# Patient Record
Sex: Female | Born: 1955 | Race: White | Hispanic: No | Smoking: Former smoker
Health system: Southern US, Community
[De-identification: ages and names within clinical notes are randomized; demographics above are authoritative.]

## PROBLEM LIST (undated history)

## (undated) ENCOUNTER — Emergency Department (HOSPITAL_COMMUNITY): Payer: Medicare Other

## (undated) DIAGNOSIS — J449 Chronic obstructive pulmonary disease, unspecified: Secondary | ICD-10-CM

## (undated) DIAGNOSIS — E079 Disorder of thyroid, unspecified: Secondary | ICD-10-CM

## (undated) DIAGNOSIS — R42 Dizziness and giddiness: Secondary | ICD-10-CM

## (undated) DIAGNOSIS — K219 Gastro-esophageal reflux disease without esophagitis: Secondary | ICD-10-CM

## (undated) DIAGNOSIS — E039 Hypothyroidism, unspecified: Secondary | ICD-10-CM

## (undated) DIAGNOSIS — R5383 Other fatigue: Secondary | ICD-10-CM

## (undated) DIAGNOSIS — M549 Dorsalgia, unspecified: Secondary | ICD-10-CM

## (undated) DIAGNOSIS — R911 Solitary pulmonary nodule: Secondary | ICD-10-CM

## (undated) DIAGNOSIS — J189 Pneumonia, unspecified organism: Secondary | ICD-10-CM

## (undated) DIAGNOSIS — M545 Low back pain, unspecified: Secondary | ICD-10-CM

## (undated) DIAGNOSIS — M542 Cervicalgia: Secondary | ICD-10-CM

## (undated) DIAGNOSIS — M797 Fibromyalgia: Secondary | ICD-10-CM

## (undated) DIAGNOSIS — D759 Disease of blood and blood-forming organs, unspecified: Secondary | ICD-10-CM

## (undated) DIAGNOSIS — J479 Bronchiectasis, uncomplicated: Secondary | ICD-10-CM

## (undated) DIAGNOSIS — F32A Depression, unspecified: Secondary | ICD-10-CM

## (undated) DIAGNOSIS — F329 Major depressive disorder, single episode, unspecified: Secondary | ICD-10-CM

## (undated) DIAGNOSIS — R519 Headache, unspecified: Secondary | ICD-10-CM

## (undated) DIAGNOSIS — G8929 Other chronic pain: Secondary | ICD-10-CM

## (undated) DIAGNOSIS — E538 Deficiency of other specified B group vitamins: Secondary | ICD-10-CM

## (undated) DIAGNOSIS — Z8719 Personal history of other diseases of the digestive system: Secondary | ICD-10-CM

## (undated) DIAGNOSIS — R0981 Nasal congestion: Secondary | ICD-10-CM

## (undated) DIAGNOSIS — M1990 Unspecified osteoarthritis, unspecified site: Secondary | ICD-10-CM

## (undated) DIAGNOSIS — F419 Anxiety disorder, unspecified: Secondary | ICD-10-CM

## (undated) DIAGNOSIS — R51 Headache: Secondary | ICD-10-CM

## (undated) DIAGNOSIS — A310 Pulmonary mycobacterial infection: Secondary | ICD-10-CM

## (undated) DIAGNOSIS — I10 Essential (primary) hypertension: Secondary | ICD-10-CM

## (undated) HISTORY — DX: Gastro-esophageal reflux disease without esophagitis: K21.9

## (undated) HISTORY — DX: Anxiety disorder, unspecified: F41.9

## (undated) HISTORY — DX: Unspecified osteoarthritis, unspecified site: M19.90

## (undated) HISTORY — DX: Deficiency of other specified B group vitamins: E53.8

## (undated) HISTORY — DX: Disorder of thyroid, unspecified: E07.9

## (undated) HISTORY — DX: Low back pain: M54.5

## (undated) HISTORY — DX: Pulmonary mycobacterial infection: A31.0

## (undated) HISTORY — PX: CERVICAL LAMINECTOMY: SHX94

## (undated) HISTORY — DX: Low back pain, unspecified: M54.50

## (undated) HISTORY — DX: Major depressive disorder, single episode, unspecified: F32.9

## (undated) HISTORY — DX: Depression, unspecified: F32.A

---

## 1978-10-29 HISTORY — PX: TUBAL LIGATION: SHX77

## 1999-12-07 ENCOUNTER — Encounter: Payer: Self-pay | Admitting: Emergency Medicine

## 1999-12-07 ENCOUNTER — Emergency Department (HOSPITAL_COMMUNITY): Admission: EM | Admit: 1999-12-07 | Discharge: 1999-12-07 | Payer: Self-pay | Admitting: Emergency Medicine

## 2000-06-13 ENCOUNTER — Ambulatory Visit (HOSPITAL_COMMUNITY): Admission: RE | Admit: 2000-06-13 | Discharge: 2000-06-14 | Payer: Self-pay | Admitting: Neurosurgery

## 2000-07-05 ENCOUNTER — Encounter: Admission: RE | Admit: 2000-07-05 | Discharge: 2000-07-05 | Payer: Self-pay | Admitting: Neurosurgery

## 2000-09-03 ENCOUNTER — Encounter: Admission: RE | Admit: 2000-09-03 | Discharge: 2000-09-03 | Payer: Self-pay | Admitting: Neurosurgery

## 2000-09-12 ENCOUNTER — Encounter: Admission: RE | Admit: 2000-09-12 | Discharge: 2000-10-11 | Payer: Self-pay | Admitting: Neurosurgery

## 2000-12-03 ENCOUNTER — Encounter: Admission: RE | Admit: 2000-12-03 | Discharge: 2000-12-03 | Payer: Self-pay | Admitting: Neurosurgery

## 2001-05-19 ENCOUNTER — Other Ambulatory Visit: Admission: RE | Admit: 2001-05-19 | Discharge: 2001-05-19 | Payer: Self-pay | Admitting: *Deleted

## 2001-05-20 ENCOUNTER — Encounter (INDEPENDENT_AMBULATORY_CARE_PROVIDER_SITE_OTHER): Payer: Self-pay

## 2001-05-20 ENCOUNTER — Other Ambulatory Visit: Admission: RE | Admit: 2001-05-20 | Discharge: 2001-05-20 | Payer: Self-pay | Admitting: *Deleted

## 2001-07-22 ENCOUNTER — Ambulatory Visit (HOSPITAL_COMMUNITY): Admission: RE | Admit: 2001-07-22 | Discharge: 2001-07-22 | Payer: Self-pay | Admitting: Neurosurgery

## 2001-08-28 ENCOUNTER — Encounter: Admission: RE | Admit: 2001-08-28 | Discharge: 2001-11-26 | Payer: Self-pay | Admitting: Neurosurgery

## 2001-12-23 ENCOUNTER — Inpatient Hospital Stay (HOSPITAL_COMMUNITY): Admission: RE | Admit: 2001-12-23 | Discharge: 2001-12-24 | Payer: Self-pay | Admitting: Psychiatry

## 2002-01-13 ENCOUNTER — Encounter: Admission: RE | Admit: 2002-01-13 | Discharge: 2002-01-13 | Payer: Self-pay | Admitting: *Deleted

## 2002-01-26 ENCOUNTER — Encounter: Admission: RE | Admit: 2002-01-26 | Discharge: 2002-01-26 | Payer: Self-pay | Admitting: *Deleted

## 2002-02-09 ENCOUNTER — Encounter: Admission: RE | Admit: 2002-02-09 | Discharge: 2002-02-09 | Payer: Self-pay | Admitting: Psychiatry

## 2002-03-06 ENCOUNTER — Encounter: Admission: RE | Admit: 2002-03-06 | Discharge: 2002-03-06 | Payer: Self-pay | Admitting: Psychiatry

## 2002-03-12 ENCOUNTER — Encounter: Admission: RE | Admit: 2002-03-12 | Discharge: 2002-03-12 | Payer: Self-pay | Admitting: Psychiatry

## 2002-03-18 ENCOUNTER — Encounter: Admission: RE | Admit: 2002-03-18 | Discharge: 2002-03-18 | Payer: Self-pay | Admitting: Psychiatry

## 2002-03-27 ENCOUNTER — Encounter: Admission: RE | Admit: 2002-03-27 | Discharge: 2002-03-27 | Payer: Self-pay | Admitting: Psychiatry

## 2002-04-13 ENCOUNTER — Encounter: Admission: RE | Admit: 2002-04-13 | Discharge: 2002-04-13 | Payer: Self-pay | Admitting: Psychiatry

## 2002-05-13 ENCOUNTER — Encounter: Admission: RE | Admit: 2002-05-13 | Discharge: 2002-05-13 | Payer: Self-pay | Admitting: Psychiatry

## 2002-07-04 ENCOUNTER — Emergency Department (HOSPITAL_COMMUNITY): Admission: EM | Admit: 2002-07-04 | Discharge: 2002-07-04 | Payer: Self-pay | Admitting: Emergency Medicine

## 2002-11-02 ENCOUNTER — Encounter: Payer: Self-pay | Admitting: Internal Medicine

## 2002-11-02 ENCOUNTER — Encounter: Admission: RE | Admit: 2002-11-02 | Discharge: 2002-11-02 | Payer: Self-pay | Admitting: Internal Medicine

## 2002-12-22 ENCOUNTER — Emergency Department (HOSPITAL_COMMUNITY): Admission: EM | Admit: 2002-12-22 | Discharge: 2002-12-23 | Payer: Self-pay | Admitting: Emergency Medicine

## 2002-12-23 ENCOUNTER — Emergency Department (HOSPITAL_COMMUNITY): Admission: EM | Admit: 2002-12-23 | Discharge: 2002-12-23 | Payer: Self-pay | Admitting: Emergency Medicine

## 2002-12-23 ENCOUNTER — Encounter: Payer: Self-pay | Admitting: Emergency Medicine

## 2004-09-15 ENCOUNTER — Ambulatory Visit: Payer: Self-pay | Admitting: Endocrinology

## 2004-09-15 ENCOUNTER — Ambulatory Visit (HOSPITAL_COMMUNITY): Admission: RE | Admit: 2004-09-15 | Discharge: 2004-09-15 | Payer: Self-pay | Admitting: Endocrinology

## 2004-10-06 ENCOUNTER — Ambulatory Visit: Payer: Self-pay | Admitting: Internal Medicine

## 2004-11-02 ENCOUNTER — Ambulatory Visit (HOSPITAL_COMMUNITY): Admission: RE | Admit: 2004-11-02 | Discharge: 2004-11-03 | Payer: Self-pay | Admitting: Neurosurgery

## 2005-02-16 ENCOUNTER — Ambulatory Visit: Payer: Self-pay | Admitting: Internal Medicine

## 2005-03-30 ENCOUNTER — Ambulatory Visit: Payer: Self-pay | Admitting: Internal Medicine

## 2005-05-29 ENCOUNTER — Ambulatory Visit: Payer: Self-pay | Admitting: Internal Medicine

## 2005-06-08 ENCOUNTER — Ambulatory Visit: Payer: Self-pay | Admitting: Internal Medicine

## 2005-08-01 ENCOUNTER — Ambulatory Visit: Payer: Self-pay | Admitting: Internal Medicine

## 2005-09-03 ENCOUNTER — Ambulatory Visit: Payer: Self-pay | Admitting: Internal Medicine

## 2005-09-06 ENCOUNTER — Ambulatory Visit: Payer: Self-pay | Admitting: Cardiology

## 2005-09-06 ENCOUNTER — Ambulatory Visit (HOSPITAL_COMMUNITY): Admission: RE | Admit: 2005-09-06 | Discharge: 2005-09-06 | Payer: Self-pay | Admitting: Internal Medicine

## 2005-11-12 ENCOUNTER — Ambulatory Visit: Payer: Self-pay | Admitting: Internal Medicine

## 2005-11-12 ENCOUNTER — Ambulatory Visit (HOSPITAL_COMMUNITY): Admission: RE | Admit: 2005-11-12 | Discharge: 2005-11-12 | Payer: Self-pay | Admitting: Internal Medicine

## 2005-12-14 ENCOUNTER — Ambulatory Visit: Payer: Self-pay | Admitting: Internal Medicine

## 2006-01-23 ENCOUNTER — Ambulatory Visit: Payer: Self-pay | Admitting: Internal Medicine

## 2006-04-15 ENCOUNTER — Ambulatory Visit: Payer: Self-pay | Admitting: Internal Medicine

## 2006-05-07 ENCOUNTER — Ambulatory Visit: Payer: Self-pay | Admitting: Internal Medicine

## 2006-07-08 ENCOUNTER — Ambulatory Visit: Payer: Self-pay | Admitting: Internal Medicine

## 2006-09-26 ENCOUNTER — Ambulatory Visit: Payer: Self-pay | Admitting: Internal Medicine

## 2006-09-26 LAB — CONVERTED CEMR LAB
ALT: 13 units/L (ref 0–40)
AST: 16 units/L (ref 0–37)
Albumin: 3.9 g/dL (ref 3.5–5.2)
Alkaline Phosphatase: 63 units/L (ref 39–117)
BUN: 17 mg/dL (ref 6–23)
CO2: 30 meq/L (ref 19–32)
Calcium: 9.5 mg/dL (ref 8.4–10.5)
Chloride: 104 meq/L (ref 96–112)
Creatinine, Ser: 0.9 mg/dL (ref 0.4–1.2)
GFR calc non Af Amer: 70 mL/min
Glomerular Filtration Rate, Af Am: 85 mL/min/{1.73_m2}
Glucose, Bld: 78 mg/dL (ref 70–99)
Potassium: 4.3 meq/L (ref 3.5–5.1)
Sed Rate: 11 mm/hr (ref 0–25)
Sodium: 140 meq/L (ref 135–145)
Total Bilirubin: 0.7 mg/dL (ref 0.3–1.2)
Total CK: 85 units/L (ref 7–177)
Total Protein: 6.6 g/dL (ref 6.0–8.3)

## 2006-10-09 ENCOUNTER — Ambulatory Visit: Payer: Self-pay | Admitting: Internal Medicine

## 2006-12-23 ENCOUNTER — Ambulatory Visit: Payer: Self-pay | Admitting: Internal Medicine

## 2006-12-26 ENCOUNTER — Ambulatory Visit (HOSPITAL_COMMUNITY): Admission: RE | Admit: 2006-12-26 | Discharge: 2006-12-26 | Payer: Self-pay | Admitting: Internal Medicine

## 2007-01-06 ENCOUNTER — Ambulatory Visit: Payer: Self-pay | Admitting: Internal Medicine

## 2007-01-24 ENCOUNTER — Ambulatory Visit: Payer: Self-pay | Admitting: Internal Medicine

## 2007-04-29 ENCOUNTER — Ambulatory Visit: Payer: Self-pay | Admitting: Internal Medicine

## 2007-04-29 LAB — CONVERTED CEMR LAB
Basophils Absolute: 0.1 10*3/uL (ref 0.0–0.1)
Basophils Relative: 1 % (ref 0.0–1.0)
Bilirubin Urine: NEGATIVE
Eosinophils Absolute: 0.2 10*3/uL (ref 0.0–0.6)
Eosinophils Relative: 2.9 % (ref 0.0–5.0)
HCT: 36.8 % (ref 36.0–46.0)
Hemoglobin: 11.8 g/dL — ABNORMAL LOW (ref 12.0–15.0)
Iron: 22 ug/dL — ABNORMAL LOW (ref 42–145)
Ketones, ur: NEGATIVE mg/dL
Leukocytes, UA: NEGATIVE
Lymphocytes Relative: 31.3 % (ref 12.0–46.0)
MCHC: 32.1 g/dL (ref 30.0–36.0)
MCV: 68.7 fL — ABNORMAL LOW (ref 78.0–100.0)
Monocytes Absolute: 0.5 10*3/uL (ref 0.2–0.7)
Monocytes Relative: 7.9 % (ref 3.0–11.0)
Neutro Abs: 3.5 10*3/uL (ref 1.4–7.7)
Neutrophils Relative %: 56.9 % (ref 43.0–77.0)
Nitrite: NEGATIVE
Platelets: 228 10*3/uL (ref 150–400)
RBC: 5.35 M/uL — ABNORMAL HIGH (ref 3.87–5.11)
RDW: 17.6 % — ABNORMAL HIGH (ref 11.5–14.6)
Saturation Ratios: 3.7 % — ABNORMAL LOW (ref 20.0–50.0)
Sed Rate: 10 mm/hr (ref 0–25)
Specific Gravity, Urine: 1.025 (ref 1.000–1.03)
TSH: 11.86 microintl units/mL — ABNORMAL HIGH (ref 0.35–5.50)
Total CK: 88 units/L (ref 7–177)
Total Protein, Urine: NEGATIVE mg/dL
Transferrin: 429.2 mg/dL — ABNORMAL HIGH (ref >=212.0)
Urine Glucose: NEGATIVE mg/dL
Urobilinogen, UA: 0.2 (ref 0.0–1.0)
Vitamin B-12: 696 pg/mL (ref 211–911)
WBC: 6.3 10*3/uL (ref 4.5–10.5)
pH: 5.5 (ref 5.0–8.0)

## 2007-05-22 DIAGNOSIS — D509 Iron deficiency anemia, unspecified: Secondary | ICD-10-CM

## 2007-05-22 DIAGNOSIS — E538 Deficiency of other specified B group vitamins: Secondary | ICD-10-CM | POA: Insufficient documentation

## 2007-06-06 ENCOUNTER — Ambulatory Visit: Payer: Self-pay | Admitting: Internal Medicine

## 2007-07-23 DIAGNOSIS — F419 Anxiety disorder, unspecified: Secondary | ICD-10-CM | POA: Insufficient documentation

## 2007-07-23 DIAGNOSIS — IMO0001 Reserved for inherently not codable concepts without codable children: Secondary | ICD-10-CM | POA: Insufficient documentation

## 2007-07-23 DIAGNOSIS — F411 Generalized anxiety disorder: Secondary | ICD-10-CM | POA: Insufficient documentation

## 2007-07-23 DIAGNOSIS — M545 Low back pain: Secondary | ICD-10-CM | POA: Insufficient documentation

## 2007-08-05 ENCOUNTER — Encounter: Payer: Self-pay | Admitting: Internal Medicine

## 2007-08-05 ENCOUNTER — Ambulatory Visit: Payer: Self-pay | Admitting: Internal Medicine

## 2007-08-05 DIAGNOSIS — F319 Bipolar disorder, unspecified: Secondary | ICD-10-CM

## 2007-08-05 DIAGNOSIS — K219 Gastro-esophageal reflux disease without esophagitis: Secondary | ICD-10-CM | POA: Insufficient documentation

## 2007-08-05 DIAGNOSIS — E039 Hypothyroidism, unspecified: Secondary | ICD-10-CM

## 2007-08-05 LAB — CONVERTED CEMR LAB
BUN: 14 mg/dL (ref 6–23)
Bilirubin Urine: NEGATIVE
CO2: 31 meq/L (ref 19–32)
Calcium: 9 mg/dL (ref 8.4–10.5)
Chloride: 107 meq/L (ref 96–112)
Creatinine, Ser: 0.7 mg/dL (ref 0.4–1.2)
GFR calc Af Amer: 113 mL/min
GFR calc non Af Amer: 93 mL/min
Glucose, Bld: 107 mg/dL — ABNORMAL HIGH (ref 70–99)
Ketones, ur: NEGATIVE mg/dL
Nitrite: NEGATIVE
Potassium: 3.6 meq/L (ref 3.5–5.1)
Sodium: 144 meq/L (ref 135–145)
Specific Gravity, Urine: >=1.03 (ref 1.000–1.03)
TSH: 2.65 microintl units/mL (ref 0.35–5.50)
Urine Glucose: NEGATIVE mg/dL
Urobilinogen, UA: 0.2 (ref 0.0–1.0)
pH: 5.5 (ref 5.0–8.0)

## 2007-11-11 ENCOUNTER — Ambulatory Visit: Payer: Self-pay | Admitting: Internal Medicine

## 2007-11-11 DIAGNOSIS — J069 Acute upper respiratory infection, unspecified: Secondary | ICD-10-CM | POA: Insufficient documentation

## 2007-11-12 LAB — CONVERTED CEMR LAB
ALT: 14 units/L (ref 0–35)
AST: 15 units/L (ref 0–37)
Albumin: 4.2 g/dL (ref 3.5–5.2)
Alkaline Phosphatase: 67 units/L (ref 39–117)
BUN: 10 mg/dL (ref 6–23)
Basophils Absolute: 0 10*3/uL (ref 0.0–0.1)
Basophils Relative: 0.2 % (ref 0.0–1.0)
Bilirubin Urine: NEGATIVE
Bilirubin, Direct: 0.1 mg/dL (ref 0.0–0.3)
CO2: 30 meq/L (ref 19–32)
Calcium: 9.9 mg/dL (ref 8.4–10.5)
Chloride: 104 meq/L (ref 96–112)
Creatinine, Ser: 0.7 mg/dL (ref 0.4–1.2)
Crystals: NEGATIVE
Eosinophils Absolute: 0.3 10*3/uL (ref 0.0–0.6)
Eosinophils Relative: 3.6 % (ref 0.0–5.0)
GFR calc Af Amer: 113 mL/min
GFR calc non Af Amer: 93 mL/min
Glucose, Bld: 81 mg/dL (ref 70–99)
HCT: 42.8 % (ref 36.0–46.0)
Hemoglobin, Urine: NEGATIVE
Hemoglobin: 14.2 g/dL (ref 12.0–15.0)
Ketones, ur: NEGATIVE mg/dL
Lymphocytes Relative: 28.9 % (ref 12.0–46.0)
MCHC: 33.1 g/dL (ref 30.0–36.0)
MCV: 78.7 fL (ref 78.0–100.0)
Monocytes Absolute: 0.5 10*3/uL (ref 0.2–0.7)
Monocytes Relative: 6.8 % (ref 3.0–11.0)
Mucus, UA: NEGATIVE
Neutro Abs: 4.3 10*3/uL (ref 1.4–7.7)
Neutrophils Relative %: 60.5 % (ref 43.0–77.0)
Nitrite: NEGATIVE
Platelets: 201 10*3/uL (ref 150–400)
Potassium: 4.2 meq/L (ref 3.5–5.1)
RBC: 5.43 M/uL — ABNORMAL HIGH (ref 3.87–5.11)
RDW: 16.4 % — ABNORMAL HIGH (ref 11.5–14.6)
Sed Rate: 10 mm/hr (ref 0–25)
Sodium: 141 meq/L (ref 135–145)
Specific Gravity, Urine: 1.02 (ref 1.000–1.03)
TSH: 2.77 microintl units/mL (ref 0.35–5.50)
Total Bilirubin: 0.9 mg/dL (ref 0.3–1.2)
Total Protein, Urine: NEGATIVE mg/dL
Total Protein: 7.5 g/dL (ref 6.0–8.3)
Urine Glucose: NEGATIVE mg/dL
Urobilinogen, UA: 0.2 (ref 0.0–1.0)
Vitamin B-12: 680 pg/mL (ref 211–911)
WBC: 7.2 10*3/uL (ref 4.5–10.5)
pH: 6.5 (ref 5.0–8.0)

## 2007-11-13 ENCOUNTER — Encounter: Payer: Self-pay | Admitting: Internal Medicine

## 2007-11-13 DIAGNOSIS — E559 Vitamin D deficiency, unspecified: Secondary | ICD-10-CM

## 2007-11-17 LAB — CONVERTED CEMR LAB: Vit D, 1,25-Dihydroxy: 22 — ABNORMAL LOW (ref 30–89)

## 2007-11-25 ENCOUNTER — Telehealth: Payer: Self-pay | Admitting: Internal Medicine

## 2007-11-28 ENCOUNTER — Telehealth: Payer: Self-pay | Admitting: Internal Medicine

## 2008-02-13 ENCOUNTER — Ambulatory Visit: Payer: Self-pay | Admitting: Internal Medicine

## 2008-02-13 DIAGNOSIS — D485 Neoplasm of uncertain behavior of skin: Secondary | ICD-10-CM

## 2008-03-05 ENCOUNTER — Telehealth: Payer: Self-pay | Admitting: Internal Medicine

## 2008-03-12 ENCOUNTER — Telehealth: Payer: Self-pay | Admitting: Internal Medicine

## 2008-04-06 ENCOUNTER — Ambulatory Visit: Payer: Self-pay | Admitting: Internal Medicine

## 2008-04-06 DIAGNOSIS — B079 Viral wart, unspecified: Secondary | ICD-10-CM | POA: Insufficient documentation

## 2008-04-13 ENCOUNTER — Telehealth: Payer: Self-pay | Admitting: Internal Medicine

## 2008-05-19 ENCOUNTER — Encounter: Payer: Self-pay | Admitting: Internal Medicine

## 2008-05-24 ENCOUNTER — Ambulatory Visit: Payer: Self-pay | Admitting: Internal Medicine

## 2008-05-24 DIAGNOSIS — S139XXA Sprain of joints and ligaments of unspecified parts of neck, initial encounter: Secondary | ICD-10-CM

## 2008-05-24 DIAGNOSIS — M25539 Pain in unspecified wrist: Secondary | ICD-10-CM

## 2008-05-24 LAB — CONVERTED CEMR LAB
ALT: 15 units/L (ref 0–35)
AST: 15 units/L (ref 0–37)
Albumin: 3.8 g/dL (ref 3.5–5.2)
Alkaline Phosphatase: 77 units/L (ref 39–117)
BUN: 10 mg/dL (ref 6–23)
Bilirubin, Direct: 0.1 mg/dL (ref 0.0–0.3)
CO2: 31 meq/L (ref 19–32)
Calcium: 9.1 mg/dL (ref 8.4–10.5)
Chloride: 107 meq/L (ref 96–112)
Cholesterol: 177 mg/dL (ref 0–200)
Creatinine, Ser: 0.7 mg/dL (ref 0.4–1.2)
GFR calc Af Amer: 113 mL/min
GFR calc non Af Amer: 93 mL/min
Glucose, Bld: 76 mg/dL (ref 70–99)
HDL: 45.4 mg/dL (ref >39.0)
LDL Cholesterol: 126 mg/dL — ABNORMAL HIGH (ref 0–99)
Potassium: 4.5 meq/L (ref 3.5–5.1)
Sodium: 143 meq/L (ref 135–145)
TSH: 4.35 microintl units/mL (ref 0.35–5.50)
Total Bilirubin: 0.7 mg/dL (ref 0.3–1.2)
Total CHOL/HDL Ratio: 3.9
Total Protein: 6.6 g/dL (ref 6.0–8.3)
Triglycerides: 29 mg/dL (ref 0–149)
VLDL: 6 mg/dL (ref 0–40)

## 2008-06-08 ENCOUNTER — Telehealth: Payer: Self-pay | Admitting: Internal Medicine

## 2008-06-14 ENCOUNTER — Telehealth: Payer: Self-pay | Admitting: Internal Medicine

## 2008-06-30 ENCOUNTER — Ambulatory Visit: Payer: Self-pay | Admitting: Internal Medicine

## 2008-08-03 ENCOUNTER — Encounter: Payer: Self-pay | Admitting: Internal Medicine

## 2008-09-01 ENCOUNTER — Ambulatory Visit: Payer: Self-pay | Admitting: Internal Medicine

## 2008-09-03 ENCOUNTER — Telehealth (INDEPENDENT_AMBULATORY_CARE_PROVIDER_SITE_OTHER): Payer: Self-pay | Admitting: *Deleted

## 2008-09-13 ENCOUNTER — Encounter: Payer: Self-pay | Admitting: Internal Medicine

## 2008-09-17 ENCOUNTER — Encounter (INDEPENDENT_AMBULATORY_CARE_PROVIDER_SITE_OTHER): Payer: Self-pay | Admitting: *Deleted

## 2008-10-06 ENCOUNTER — Telehealth: Payer: Self-pay | Admitting: Internal Medicine

## 2008-11-02 ENCOUNTER — Ambulatory Visit: Payer: Self-pay | Admitting: Internal Medicine

## 2008-11-19 ENCOUNTER — Telehealth: Payer: Self-pay | Admitting: Internal Medicine

## 2008-12-07 ENCOUNTER — Encounter (INDEPENDENT_AMBULATORY_CARE_PROVIDER_SITE_OTHER): Payer: Self-pay | Admitting: *Deleted

## 2008-12-15 ENCOUNTER — Encounter: Payer: Self-pay | Admitting: Internal Medicine

## 2008-12-26 ENCOUNTER — Encounter: Admission: RE | Admit: 2008-12-26 | Discharge: 2008-12-26 | Payer: Self-pay | Admitting: Neurosurgery

## 2008-12-27 ENCOUNTER — Telehealth: Payer: Self-pay | Admitting: Internal Medicine

## 2009-01-04 ENCOUNTER — Ambulatory Visit: Payer: Self-pay | Admitting: Internal Medicine

## 2009-01-06 LAB — CONVERTED CEMR LAB
BUN: 12 mg/dL (ref 6–23)
Basophils Absolute: 0.1 10*3/uL (ref 0.0–0.1)
Basophils Relative: 0.8 % (ref 0.0–3.0)
Bilirubin Urine: NEGATIVE
CO2: 31 meq/L (ref 19–32)
Calcium: 9.8 mg/dL (ref 8.4–10.5)
Chloride: 102 meq/L (ref 96–112)
Creatinine, Ser: 0.7 mg/dL (ref 0.4–1.2)
Eosinophils Absolute: 0.2 10*3/uL (ref 0.0–0.7)
Eosinophils Relative: 2.5 % (ref 0.0–5.0)
GFR calc Af Amer: 112 mL/min
GFR calc non Af Amer: 93 mL/min
Glucose, Bld: 103 mg/dL — ABNORMAL HIGH (ref 70–99)
HCT: 47 % — ABNORMAL HIGH (ref 36.0–46.0)
Hemoglobin, Urine: NEGATIVE
Hemoglobin: 16 g/dL — ABNORMAL HIGH (ref 12.0–15.0)
Ketones, ur: NEGATIVE mg/dL
Leukocytes, UA: NEGATIVE
Lymphocytes Relative: 25 % (ref 12.0–46.0)
MCHC: 34.1 g/dL (ref 30.0–36.0)
MCV: 88 fL (ref 78.0–100.0)
Monocytes Absolute: 0.3 10*3/uL (ref 0.1–1.0)
Monocytes Relative: 5.2 % (ref 3.0–12.0)
Neutro Abs: 4.4 10*3/uL (ref 1.4–7.7)
Neutrophils Relative %: 66.5 % (ref 43.0–77.0)
Nitrite: NEGATIVE
Platelets: 176 10*3/uL (ref 150–400)
Potassium: 4.7 meq/L (ref 3.5–5.1)
RBC: 5.34 M/uL — ABNORMAL HIGH (ref 3.87–5.11)
RDW: 13.3 % (ref 11.5–14.6)
Sodium: 140 meq/L (ref 135–145)
Specific Gravity, Urine: 1.01 (ref 1.000–1.035)
TSH: 1.79 microintl units/mL (ref 0.35–5.50)
Total Protein, Urine: NEGATIVE mg/dL
Urine Glucose: NEGATIVE mg/dL
Urobilinogen, UA: 0.2 (ref 0.0–1.0)
WBC: 6.6 10*3/uL (ref 4.5–10.5)
pH: 7 (ref 5.0–8.0)

## 2009-01-13 ENCOUNTER — Telehealth: Payer: Self-pay | Admitting: Internal Medicine

## 2009-02-04 ENCOUNTER — Ambulatory Visit: Payer: Self-pay | Admitting: Internal Medicine

## 2009-02-04 DIAGNOSIS — R1011 Right upper quadrant pain: Secondary | ICD-10-CM

## 2009-02-21 ENCOUNTER — Telehealth: Payer: Self-pay | Admitting: Internal Medicine

## 2009-02-28 ENCOUNTER — Telehealth: Payer: Self-pay | Admitting: Internal Medicine

## 2009-04-27 ENCOUNTER — Ambulatory Visit: Payer: Self-pay | Admitting: Internal Medicine

## 2009-06-11 ENCOUNTER — Telehealth: Payer: Self-pay | Admitting: Family Medicine

## 2009-06-13 ENCOUNTER — Telehealth: Payer: Self-pay | Admitting: Internal Medicine

## 2009-06-13 ENCOUNTER — Encounter: Payer: Self-pay | Admitting: Internal Medicine

## 2009-06-29 ENCOUNTER — Ambulatory Visit: Payer: Self-pay | Admitting: Internal Medicine

## 2009-06-29 LAB — CONVERTED CEMR LAB: Vit D, 25-Hydroxy: 35 ng/mL (ref 30–89)

## 2009-07-01 ENCOUNTER — Telehealth: Payer: Self-pay | Admitting: Internal Medicine

## 2009-07-01 LAB — CONVERTED CEMR LAB
ALT: 18 units/L (ref 0–35)
AST: 18 units/L (ref 0–37)
Albumin: 4.3 g/dL (ref 3.5–5.2)
Alkaline Phosphatase: 67 units/L (ref 39–117)
BUN: 15 mg/dL (ref 6–23)
Basophils Absolute: 0 10*3/uL (ref 0.0–0.1)
Basophils Relative: 0.3 % (ref 0.0–3.0)
Bilirubin, Direct: 0.2 mg/dL (ref 0.0–0.3)
CO2: 31 meq/L (ref 19–32)
Calcium: 9.8 mg/dL (ref 8.4–10.5)
Chloride: 105 meq/L (ref 96–112)
Creatinine, Ser: 0.7 mg/dL (ref 0.4–1.2)
Eosinophils Absolute: 0.1 10*3/uL (ref 0.0–0.7)
Eosinophils Relative: 2.4 % (ref 0.0–5.0)
GFR calc non Af Amer: 92.47 mL/min (ref >60)
Glucose, Bld: 89 mg/dL (ref 70–99)
HCT: 44.9 % (ref 36.0–46.0)
Hemoglobin: 15.4 g/dL — ABNORMAL HIGH (ref 12.0–15.0)
Leukocytes, UA: NEGATIVE
Lymphocytes Relative: 23.1 % (ref 12.0–46.0)
Lymphs Abs: 1.3 10*3/uL (ref 0.7–4.0)
MCHC: 34.3 g/dL (ref 30.0–36.0)
MCV: 88.7 fL (ref 78.0–100.0)
Monocytes Absolute: 0.4 10*3/uL (ref 0.1–1.0)
Monocytes Relative: 6.3 % (ref 3.0–12.0)
Neutro Abs: 3.9 10*3/uL (ref 1.4–7.7)
Neutrophils Relative %: 67.9 % (ref 43.0–77.0)
Nitrite: NEGATIVE
Platelets: 166 10*3/uL (ref 150.0–400.0)
Potassium: 3.9 meq/L (ref 3.5–5.1)
RBC: 5.07 M/uL (ref 3.87–5.11)
RDW: 12.8 % (ref 11.5–14.6)
Sed Rate: 6 mm/hr (ref 0–22)
Sodium: 142 meq/L (ref 135–145)
Specific Gravity, Urine: >=1.03 (ref 1.000–1.030)
TSH: 1.31 microintl units/mL (ref 0.35–5.50)
Total Bilirubin: 1.1 mg/dL (ref 0.3–1.2)
Total CK: 183 units/L — ABNORMAL HIGH (ref 7–177)
Total Protein, Urine: NEGATIVE mg/dL
Total Protein: 7.1 g/dL (ref 6.0–8.3)
Urine Glucose: NEGATIVE mg/dL
Urobilinogen, UA: 0.2 (ref 0.0–1.0)
Vitamin B-12: 770 pg/mL (ref 211–911)
WBC: 5.7 10*3/uL (ref 4.5–10.5)
pH: 6 (ref 5.0–8.0)

## 2009-07-05 ENCOUNTER — Telehealth: Payer: Self-pay | Admitting: Internal Medicine

## 2009-07-26 ENCOUNTER — Encounter: Payer: Self-pay | Admitting: Internal Medicine

## 2009-09-02 ENCOUNTER — Ambulatory Visit: Payer: Self-pay | Admitting: Internal Medicine

## 2009-09-02 DIAGNOSIS — J019 Acute sinusitis, unspecified: Secondary | ICD-10-CM

## 2009-12-05 ENCOUNTER — Ambulatory Visit: Payer: Self-pay | Admitting: Internal Medicine

## 2009-12-05 DIAGNOSIS — M25559 Pain in unspecified hip: Secondary | ICD-10-CM | POA: Insufficient documentation

## 2009-12-05 DIAGNOSIS — J209 Acute bronchitis, unspecified: Secondary | ICD-10-CM | POA: Insufficient documentation

## 2010-02-24 ENCOUNTER — Telehealth: Payer: Self-pay | Admitting: Internal Medicine

## 2010-02-24 ENCOUNTER — Ambulatory Visit: Payer: Self-pay | Admitting: Internal Medicine

## 2010-02-24 DIAGNOSIS — F17200 Nicotine dependence, unspecified, uncomplicated: Secondary | ICD-10-CM | POA: Insufficient documentation

## 2010-03-01 ENCOUNTER — Telehealth: Payer: Self-pay | Admitting: Internal Medicine

## 2010-05-16 ENCOUNTER — Telehealth: Payer: Self-pay | Admitting: Internal Medicine

## 2010-05-17 ENCOUNTER — Telehealth: Payer: Self-pay | Admitting: Internal Medicine

## 2010-05-29 ENCOUNTER — Ambulatory Visit: Payer: Self-pay | Admitting: Internal Medicine

## 2010-05-29 DIAGNOSIS — Z7721 Contact with and (suspected) exposure to potentially hazardous body fluids: Secondary | ICD-10-CM | POA: Insufficient documentation

## 2010-05-29 LAB — CONVERTED CEMR LAB
ALT: 14 units/L (ref 0–35)
AST: 16 units/L (ref 0–37)
Albumin: 4.2 g/dL (ref 3.5–5.2)
Alkaline Phosphatase: 63 units/L (ref 39–117)
BUN: 13 mg/dL (ref 6–23)
Basophils Absolute: 0 10*3/uL (ref 0.0–0.1)
Basophils Relative: 0.8 % (ref 0.0–3.0)
Bilirubin Urine: NEGATIVE
Bilirubin, Direct: 0.2 mg/dL (ref 0.0–0.3)
CO2: 32 meq/L (ref 19–32)
Calcium: 9.2 mg/dL (ref 8.4–10.5)
Chloride: 105 meq/L (ref 96–112)
Cholesterol: 174 mg/dL (ref 0–200)
Creatinine, Ser: 0.8 mg/dL (ref 0.4–1.2)
Eosinophils Absolute: 0.1 10*3/uL (ref 0.0–0.7)
Eosinophils Relative: 2.9 % (ref 0.0–5.0)
GFR calc non Af Amer: 83.82 mL/min (ref >60)
Glucose, Bld: 89 mg/dL (ref 70–99)
HCT: 43.8 % (ref 36.0–46.0)
HDL: 54.2 mg/dL (ref >39.00)
Hemoglobin, Urine: NEGATIVE
Hemoglobin: 15.1 g/dL — ABNORMAL HIGH (ref 12.0–15.0)
Ketones, ur: NEGATIVE mg/dL
LDL Cholesterol: 112 mg/dL — ABNORMAL HIGH (ref 0–99)
Leukocytes, UA: NEGATIVE
Lymphocytes Relative: 33.8 % (ref 12.0–46.0)
Lymphs Abs: 1.7 10*3/uL (ref 0.7–4.0)
MCHC: 34.4 g/dL (ref 30.0–36.0)
MCV: 88.9 fL (ref 78.0–100.0)
Monocytes Absolute: 0.4 10*3/uL (ref 0.1–1.0)
Monocytes Relative: 6.9 % (ref 3.0–12.0)
Neutro Abs: 2.9 10*3/uL (ref 1.4–7.7)
Neutrophils Relative %: 55.6 % (ref 43.0–77.0)
Nitrite: NEGATIVE
Platelets: 163 10*3/uL (ref 150.0–400.0)
Potassium: 4 meq/L (ref 3.5–5.1)
RBC: 4.93 M/uL (ref 3.87–5.11)
RDW: 13.6 % (ref 11.5–14.6)
Sodium: 142 meq/L (ref 135–145)
Specific Gravity, Urine: >=1.03 (ref 1.000–1.030)
TSH: 2.53 microintl units/mL (ref 0.35–5.50)
Total Bilirubin: 1.1 mg/dL (ref 0.3–1.2)
Total CHOL/HDL Ratio: 3
Total Protein, Urine: NEGATIVE mg/dL
Total Protein: 6.7 g/dL (ref 6.0–8.3)
Triglycerides: 38 mg/dL (ref 0.0–149.0)
Urine Glucose: NEGATIVE mg/dL
Urobilinogen, UA: 0.2 (ref 0.0–1.0)
VLDL: 7.6 mg/dL (ref 0.0–40.0)
Vitamin B-12: 1086 pg/mL — ABNORMAL HIGH (ref 211–911)
WBC: 5.2 10*3/uL (ref 4.5–10.5)
pH: 5.5 (ref 5.0–8.0)

## 2010-05-30 ENCOUNTER — Telehealth: Payer: Self-pay | Admitting: Internal Medicine

## 2010-06-15 ENCOUNTER — Telehealth: Payer: Self-pay | Admitting: Internal Medicine

## 2010-07-04 ENCOUNTER — Encounter (INDEPENDENT_AMBULATORY_CARE_PROVIDER_SITE_OTHER): Payer: Self-pay | Admitting: *Deleted

## 2010-09-01 ENCOUNTER — Ambulatory Visit: Payer: Self-pay | Admitting: Internal Medicine

## 2010-09-12 ENCOUNTER — Telehealth: Payer: Self-pay | Admitting: Internal Medicine

## 2010-09-27 ENCOUNTER — Telehealth: Payer: Self-pay | Admitting: Internal Medicine

## 2010-09-28 ENCOUNTER — Telehealth: Payer: Self-pay | Admitting: Internal Medicine

## 2010-10-19 ENCOUNTER — Telehealth: Payer: Self-pay | Admitting: Internal Medicine

## 2010-11-19 ENCOUNTER — Encounter: Payer: Self-pay | Admitting: Internal Medicine

## 2010-11-30 NOTE — Progress Notes (Signed)
Summary: Levothyroxine refill  Phone Note Refill Request   Refills Requested: Medication #1:  LEVOTHYROXINE SODIUM 50 MCG  TABS once daily Patient is requesting a call back. did not give name of pharmacy  Initial call taken by: Lamar Sprinkles, CMA,  May 16, 2010 1:37 PM  Follow-up for Phone Call        Patient notes that she cannot refill until the 27th. [place] there due to using locally owned pharmacy here. I made patient aware to call us with a pharmacy [place] and prescription can be refilled via fax the same day. Follow-up by: Lucious Groves CMA,  May 16, 2010 3:19 PM

## 2010-11-30 NOTE — Progress Notes (Signed)
Summary: REQ FOR RX  Phone Note Call from Patient Call back at [phone]   Summary of Call: Patient is requesting rx that insurance will pay for to help her stop smoking. She forgot to ask at office visit.  Initial call taken by: Lamar Sprinkles, CMA,  February 24, 2010 4:51 PM  Follow-up for Phone Call        ok Chantix. Stop if depression gets worse... Follow-up by: Tresa Garter MD,  February 24, 2010 5:34 PM  Additional Follow-up for Phone Call Additional follow up Details #1::        Left mess for pt Additional Follow-up by: Lamar Sprinkles, CMA,  February 24, 2010 6:40 PM

## 2010-11-30 NOTE — Progress Notes (Signed)
Summary: Additional labs  Phone Note Outgoing Call   Call placed by: Lanier Prude, [facility]),  May 30, 2010 4:40 PM Summary of Call: Jamal Maes to call pt at [phone] to see if she wants to come back to lab to have additional specimens drawn for HIV and Hepatitis screening.  NO answer at listed number/unable to leave message.  Will retry tomorrow. Initial call taken by: Lanier Prude, [facility]),  May 30, 2010 4:41 PM  Follow-up for Phone Call        Spoke to pt...she states she will wait to have tests done at next OV. Follow-up by: Lanier Prude, [facility]),  May 31, 2010 8:54 AM

## 2010-11-30 NOTE — Assessment & Plan Note (Signed)
Summary: 3 month follow up-lb--flu shot/cd   Vital Signs:  Patient profile:   55 year old female Height:      66 inches Weight:      142 pounds BMI:     23.00 Temp:     97.5 degrees F oral Pulse rate:   80 / minute Pulse rhythm:   regular Resp:     16 per minute BP sitting:   110 / 74  (left arm) Cuff size:   regular  Vitals Entered By: Lanier Prude, [facility]) (September 01, 2010 3:38 PM) CC: 3 mo f/u c/o pain all over Is Patient Diabetic? No   CC:  3 mo f/u c/o pain all over.  History of Present Illness: The patient presents for a follow up of back pain, anxiety, depression and headaches. C/o being in pain all the time.  Current Medications (verified): 1)  Xanax 1 Mg  Tabs (Alprazolam) .... Three Times A Day 2)  Seroquel 25 Mg  Tabs (Quetiapine Fumarate) .... Once Daily 3)  Adderall 20 Mg  Tabs (Amphetamine-Dextroamphetamine) .... Three Times A Day 4)  Levothyroxine Sodium 50 Mcg  Tabs (Levothyroxine Sodium) .... Once Daily 5)  Promethazine Hcl 25 Mg  Tabs (Promethazine Hcl) .... As Needed 6)  Proair Hfa 108 (90 Base) Mcg/act  Aers (Albuterol Sulfate) .... 2 Inh Q4h As Needed Shortness of Breath 7)  Prevacid 30 Mg Cpdr (Lansoprazole) .... One By Mouth Daily 8)  Lidoderm 5 % Ptch (Lidocaine) .... Use 1-2 Two Times A Day Prn 9)  Tylox 5-500 Mg Caps (Oxycodone-Acetaminophen) .Marland Kitchen.. 1 By Mouth Two Times A Day Prn 10)  Cobal-1000 1000 Mcg/ml Soln (Cyanocobalamin) .Marland Kitchen.. 1 Ml Subcutaneously Q 2 Wks 11)  Vitamin D 11914 Unit  Caps (Ergocalciferol) .Marland Kitchen.. 1 By Mouth Q 3-4 Wks 12)  Tylenol Sinus Max St 30-500 Mg Tabs (Pseudoephedrine-Apap) .... As Needed 13)  Senokot S 8.6-50 Mg Tabs (Sennosides-Docusate Sodium) .Marland Kitchen.. 1 By Mouth Two Times A Day As Needed Constipation 14)  Pennsaid 1.5 % Soln (Diclofenac Sodium) .... 3-5 Gtt On A Joint Two Times A Day Prn 15)  Nabumetone 500 Mg Tabs (Nabumetone) .Marland Kitchen.. 1-2 By Mouth Once Daily Pc For Arthritis/ Pains 16)  Nasacort Aq 55 Mcg/act Aers  (Triamcinolone Acetonide(Nasal)) .Marland Kitchen.. 1 Spray in Each Nostril Daily For Rhinitis 17)  Relpax 20 Mg Tabs (Eletriptan Hydrobromide) .... As Directed  Allergies (verified): 1)  ! Sulfadiazine (Sulfadiazine) 2)  ! Codeine Sulfate (Codeine Sulfate) 3)  ! Doxycycline Hyclate (Doxycycline Hyclate) 4)  ! Tetracycline Hcl (Tetracycline Hcl) 5)  ! Morphine Sulfate Cr (Morphine Sulfate) 6)  ! Ultram Er (Tramadol Hcl) 7)  Nabumetone (Nabumetone)  Past History:  Past Medical History: Last updated: 11/02/2008 Depression, bipolar  Dr Evelene Croon Anxiety Low back pain   Dr Lovell Sheehan GERD FMS   Pain Clinic in [place] Vit B12 def Dr Amanda Pea  Family History: Reviewed history from 11/11/2007 and no changes required. Family History of Arthritis  Social History: Divorced 2010 remarried 2011 Not working, got disability 2009 Current Smoker Regular exercise-no  Review of Systems  The patient denies fever, chest pain, syncope, dyspnea on exertion, and abdominal pain.    Physical Exam  General:  NAD, looks less tired Head:  Normocephalic and atraumatic without obvious abnormalities. No apparent alopecia or balding. Nose:  External nasal examination shows no deformity or inflammation. Nasal mucosa are pink and moist without lesions or exudates. Mouth:  Oral mucosa and oropharynx without lesions or exudates.  Teeth in good  repair. Lungs:  Normal respiratory effort, chest expands symmetrically. Lungs are clear to auscultation, no crackles or wheezes. Heart:  Normal rate and regular rhythm. S1 and S2 normal without gallop, murmur, click, rub or other extra sounds. Abdomen:  Bowel sounds positive,abdomen soft and non-tender without masses, organomegaly or hernias noted. Msk:  Lumbar-sacral spine is tender to palpation over paraspinal muscles and painfull with the ROM  Cervical spine is tender to palpation over paraspinal muscles and with the ROM. Str leg elev (-) B R hip mostly R SI and R troch major are   tender  to palpation - not w/ROM  Neurologic:  No cranial nerve deficits noted. Station and gait are normal. Plantar reflexes are down-going bilaterally. DTRs are symmetrical throughout. Sensory, motor and coordinative functions appear intact. Skin:  Very tanned Mole 6 mm  on L anter chest Psych:  Oriented X3, depressed affect, not  tearful.  not suicidal.     Impression & Recommendations:  Problem # 1:  HIP PAIN (ICD-719.45) Assessment Unchanged  The following medications were removed from the medication list:    Tylox 5-500 Mg Caps (Oxycodone-acetaminophen) .Marland Kitchen... 1 by mouth two times a day prn    Nabumetone 500 Mg Tabs (Nabumetone) .Marland Kitchen... 1-2 by mouth once daily pc for arthritis/ pains Her updated medication list for this problem includes:    Hydrocodone-acetaminophen 5-325 Mg Tabs (Hydrocodone-acetaminophen) .Marland Kitchen... 1-2 by mouth two times a day as needed pain    Naproxen 500 Mg Tabs (Naproxen) .Marland Kitchen... 1 by mouth two times a day pc for pain/arthritis  Problem # 2:  ANXIETY (ICD-300.00) Assessment: Unchanged  Her updated medication list for this problem includes:    Xanax 1 Mg Tabs (Alprazolam) .Marland Kitchen... Three times a day  Problem # 3:  LOW BACK PAIN (ICD-724.2) Assessment: Unchanged See "Patient Instructions".  Pain Clinic, PT eval., Ortho consult - all were offered to the pt and declined by her due to logistic and financial reasons The following medications were removed from the medication list:    Tylox 5-500 Mg Caps (Oxycodone-acetaminophen) .Marland Kitchen... 1 by mouth two times a day prn    Nabumetone 500 Mg Tabs (Nabumetone) .Marland Kitchen... 1-2 by mouth once daily pc for arthritis/ pains Her updated medication list for this problem includes:    Hydrocodone-acetaminophen 5-325 Mg Tabs (Hydrocodone-acetaminophen) .Marland Kitchen... 1-2 by mouth two times a day as needed pain    Naproxen 500 Mg Tabs (Naproxen) .Marland Kitchen... 1 by mouth two times a day pc for pain/arthritis  Problem # 4:  HYPOTHYROIDISM (ICD-244.9) Assessment: Comment  Only  Her updated medication list for this problem includes:    Levothyroxine Sodium 50 Mcg Tabs (Levothyroxine sodium) ..... Once daily  Problem # 5:  FIBROMYALGIA (ICD-729.1) Assessment: Unchanged  The following medications were removed from the medication list:    Tylox 5-500 Mg Caps (Oxycodone-acetaminophen) .Marland Kitchen... 1 by mouth two times a day prn    Nabumetone 500 Mg Tabs (Nabumetone) .Marland Kitchen... 1-2 by mouth once daily pc for arthritis/ pains Her updated medication list for this problem includes:    Hydrocodone-acetaminophen 5-325 Mg Tabs (Hydrocodone-acetaminophen) .Marland Kitchen... 1-2 by mouth two times a day as needed pain    Naproxen 500 Mg Tabs (Naproxen) .Marland Kitchen... 1 by mouth two times a day pc for pain/arthritis  Problem # 6:  BIPOLAR DEPRESSION (ICD-296.7) Assessment: Unchanged On the regimen of medicine(s) reflected in the chart per Dr Evelene Croon    Complete Medication List: 1)  Xanax 1 Mg Tabs (Alprazolam) .... Three times a day 2)  Seroquel 25 Mg Tabs (Quetiapine fumarate) .... Once daily 3)  Adderall 20 Mg Tabs (Amphetamine-dextroamphetamine) .... Three times a day 4)  Levothyroxine Sodium 50 Mcg Tabs (Levothyroxine sodium) .... Once daily 5)  Promethazine Hcl 25 Mg Tabs (Promethazine hcl) .... As needed 6)  Proair Hfa 108 (90 Base) Mcg/act Aers (Albuterol sulfate) .... 2 inh q4h as needed shortness of breath 7)  Prevacid 30 Mg Cpdr (Lansoprazole) .... One by mouth daily 8)  Lidoderm 5 % Ptch (Lidocaine) .... Use 1-2 two times a day prn 9)  Cobal-1000 1000 Mcg/ml Soln (Cyanocobalamin) .Marland Kitchen.. 1 ml subcutaneously q 2 wks 10)  Vitamin D 30865 Unit Caps (Ergocalciferol) .Marland Kitchen.. 1 by mouth q 3-4 wks 11)  Tylenol Sinus Max St 30-500 Mg Tabs (Pseudoephedrine-apap) .... As needed 12)  Senokot S 8.6-50 Mg Tabs (Sennosides-docusate sodium) .Marland Kitchen.. 1 by mouth two times a day as needed constipation 13)  Pennsaid 1.5 % Soln (Diclofenac sodium) .... 3-5 gtt on a joint two times a day prn 14)  Nasacort Aq 55 Mcg/act  Aers (Triamcinolone acetonide(nasal)) .Marland Kitchen.. 1 spray in each nostril daily for rhinitis 15)  Relpax 20 Mg Tabs (Eletriptan hydrobromide) .... As directed 16)  Hydrocodone-acetaminophen 5-325 Mg Tabs (Hydrocodone-acetaminophen) .Marland Kitchen.. 1-2 by mouth two times a day as needed pain 17)  Naproxen 500 Mg Tabs (Naproxen) .Marland Kitchen.. 1 by mouth two times a day pc for pain/arthritis 18)  Loratadine 10 Mg Tabs (Loratadine) .Marland Kitchen.. 1 by mouth once daily as needed allergies  Patient Instructions: 1)  Please schedule a follow-up appointment in 3 months. 2)  Go on Youtube ([email or web address]) and look up "piriformis stretch", "Ileopsoas stretch", "IT band stretch" and "gluteus stretch". See the anatomy and learn the symptoms. Do the stretches - it may help!  Hold poses x 1-2 min on both sides Prescriptions: LORATADINE 10 MG TABS (LORATADINE) 1 by mouth once daily as needed allergies  #30 x 6   Entered and Authorized by:   Tresa Garter MD   Signed by:   Tresa Garter MD on 09/01/2010   Method used:   Print then Give to Patient   RxID:   [id number] NAPROXEN 500 MG TABS (NAPROXEN) 1 by mouth two times a day pc for pain/arthritis  #60 x 3   Entered and Authorized by:   Tresa Garter MD   Signed by:   Tresa Garter MD on 09/01/2010   Method used:   Print then Give to Patient   RxID:   [id number] HYDROCODONE-ACETAMINOPHEN 5-325 MG TABS (HYDROCODONE-ACETAMINOPHEN) 1-2 by mouth two times a day as needed pain  #90 x 0   Entered and Authorized by:   Tresa Garter MD   Signed by:   Tresa Garter MD on 09/01/2010   Method used:   Print then Give to Patient   RxID:   [id number]    Orders Added: 1)  Est. Patient Level IV [[address]]

## 2010-11-30 NOTE — Progress Notes (Signed)
  Phone Note Call from Patient Call back at [facility] Phone [phone]   Summary of Call: Patient is requesting rx for zpak. C/o sinus congestion/drainage and chest congestion. She feels that she has a sinus infection. Rx to go to [facility] drug. Per pt, she can not afford office visit at this time.  Initial call taken by: Lamar Sprinkles, CMA,  June 15, 2010 11:07 AM  Follow-up for Phone Call        OK Zpac OV if not better Follow-up by: Tresa Garter MD,  June 16, 2010 7:27 AM  Additional Follow-up for Phone Call Additional follow up Details #1::        Returned call to pt, per VM number is not a recognized mail box/ unable to leave messg, closing phone note until pt calls back.Marland KitchenMarland KitchenAlvy Beal Archie CMA  June 16, 2010 8:55 AM     New/Updated Medications: ZITHROMAX Z-PAK 250 MG TABS (AZITHROMYCIN) as dirrected Prescriptions: ZITHROMAX Z-PAK 250 MG TABS (AZITHROMYCIN) as dirrected  #1 x 0   Entered and Authorized by:   Tresa Garter MD   Signed by:   Tresa Garter MD on 06/16/2010   Method used:   Electronically to        [facility]* (retail)       [address]       [place], [place]  [address]       Ph: [phone]       Fax: [phone]   RxID:   [id number]

## 2010-11-30 NOTE — Progress Notes (Signed)
Summary: REQ FOR RX  Phone Note Call from Patient Call back at [phone]   Summary of Call: Pt c/o sinus congestion, pain, pressure & chest congestion. Patient is requesting rx for antibiotic.  Initial call taken by: Lamar Sprinkles, CMA,  September 27, 2010 3:50 PM  Follow-up for Phone Call        ok Zpac Follow-up by: Tresa Garter MD,  September 27, 2010 5:41 PM  Additional Follow-up for Phone Call Additional follow up Details #1::        Pt informed  Additional Follow-up by: Lamar Sprinkles, CMA,  September 27, 2010 5:47 PM    New/Updated Medications: ZITHROMAX Z-PAK 250 MG TABS (AZITHROMYCIN) as dirrected Prescriptions: ZITHROMAX Z-PAK 250 MG TABS (AZITHROMYCIN) as dirrected  #1 x 0   Entered and Authorized by:   Tresa Garter MD   Signed by:   Lamar Sprinkles, CMA on 09/27/2010   Method used:   Electronically to        [facility]* (retail)       [address]       [place], [place]  [address]       Ph: [phone]       Fax: [phone]   RxID:   [id number]

## 2010-11-30 NOTE — Assessment & Plan Note (Signed)
Summary: 3 mos f/u / # cd   Vital Signs:  Patient profile:   55 year old female Height:      66 inches Weight:      131 pounds BMI:     21.22 O2 Sat:      97 % on Room air Temp:     98.4 degrees F oral Pulse rate:   76 / minute Pulse rhythm:   regular Resp:     16 per minute BP sitting:   102 / 70  (right arm) Cuff size:   regular  Vitals Entered By: Lanier Prude, CMA(AAMA) (May 29, 2010 3:47 PM)  O2 Flow:  Room air Is Patient Diabetic? No Comments pt states she is not taking Senokot or Imitrex.   She is requesting RF on COBAL and ProAir.  She never got RX for Senokot or Nasacort because ins does not cover.   History of Present Illness: The patient presents for a follow up of FMS, back pain, neck pain,  anxiety, depression and headaches. C/o dizziness. She is suspecting her boyfriend of 3 years of infidelity and wants to be checked for STDs - no symptoms   Current Medications (verified): 1)  Xanax 1 Mg  Tabs (Alprazolam) .... Three Times A Day 2)  Seroquel 25 Mg  Tabs (Quetiapine Fumarate) .... Once Daily 3)  Adderall 20 Mg  Tabs (Amphetamine-Dextroamphetamine) .... Three Times A Day 4)  Levothyroxine Sodium 50 Mcg  Tabs (Levothyroxine Sodium) .... Once Daily 5)  Promethazine Hcl 25 Mg  Tabs (Promethazine Hcl) .... As Needed 6)  Proair Hfa 108 (90 Base) Mcg/act  Aers (Albuterol Sulfate) .... 2 Inh Q4h As Needed Shortness of Breath 7)  Prevacid 30 Mg Cpdr (Lansoprazole) .... One By Mouth Daily 8)  Lidoderm 5 % Ptch (Lidocaine) .... Use 1-2 Two Times A Day Prn 9)  Tylox 5-500 Mg Caps (Oxycodone-Acetaminophen) .Marland Kitchen.. 1 By Mouth Two Times A Day Prn 10)  Cobal-1000 1000 Mcg/ml Soln (Cyanocobalamin) .Marland Kitchen.. 1 Ml Subcutaneously Q 2 Wks 11)  Vitamin D 16109 Unit  Caps (Ergocalciferol) .Marland Kitchen.. 1 By Mouth Q 3-4 Wks 12)  Tylenol Sinus Max St 30-500 Mg Tabs (Pseudoephedrine-Apap) .... As Needed 13)  Senokot S 8.6-50 Mg Tabs (Sennosides-Docusate Sodium) .Marland Kitchen.. 1 By Mouth Two Times A Day As  Needed Constipation 14)  Pennsaid 1.5 % Soln (Diclofenac Sodium) .... 3-5 Gtt On A Joint Two Times A Day Prn 15)  Imitrex 100 Mg Tabs (Sumatriptan Succinate) .Marland Kitchen.. 1 By Mouth Once Daily As Needed Migraine / Insurance Will Cover 27 Tabs Every 90 Days 16)  Diflucan 100 Mg Tabs (Fluconazole) .Marland Kitchen.. 1 By Mouth Once Daily Once As Needed Vaginitis 17)  Nabumetone 500 Mg Tabs (Nabumetone) .Marland Kitchen.. 1-2 By Mouth Once Daily Pc For Arthritis/ Pains 18)  Nasacort Aq 55 Mcg/act Aers (Triamcinolone Acetonide(Nasal)) .Marland Kitchen.. 1 Spray in Each Nostril Daily For Rhinitis 19)  Chantix Continuing Month Pak 1 Mg Tabs (Varenicline Tartrate) .... As Dirrected 20)  Chantix Starting Month Pak 0.5 Mg X 11 & 1 Mg X 42 Tabs (Varenicline Tartrate) .... As Dirrected 21)  Relpax 20 Mg Tabs (Eletriptan Hydrobromide) .... As Directed  Allergies (verified): 1)  ! Sulfadiazine (Sulfadiazine) 2)  ! Codeine Sulfate (Codeine Sulfate) 3)  ! Doxycycline Hyclate (Doxycycline Hyclate) 4)  ! Tetracycline Hcl (Tetracycline Hcl) 5)  ! Morphine Sulfate Cr (Morphine Sulfate) 6)  ! Ultram Er (Tramadol Hcl) 7)  ! Hydrocodone-Ibuprofen (Hydrocodone-Ibuprofen)  Past History:  Past Medical History: Last updated: 11/02/2008  Depression, bipolar  Dr Evelene Croon Anxiety Low back pain   Dr Lovell Sheehan GERD FMS   Pain Clinic in [place] Vit B12 def Dr Amanda Pea  Past Surgical History: Last updated: 01/04/2009 Cervical laminectomy 2001 and 2005 Dr Lovell Sheehan    Family History: Last updated: 11/11/2007 Family History of Arthritis  Social History: Last updated: 09/02/2009 Divorced 2010 Not working, got disability 2009 Current Smoker Regular exercise-no  Review of Systems  The patient denies fever.         no vaginal d/c, no rash  Physical Exam  General:  NAD, looks less tired Eyes:  No corneal or conjunctival inflammation noted. EOMI. Perrla. Ears:  External ear exam shows no significant lesions or deformities.   Nose:  External nasal examination shows  no deformity or inflammation. Nasal mucosa are pink and moist without lesions or exudates. Mouth:  Oral mucosa and oropharynx without lesions or exudates.  Teeth in good repair. Neck:  Cervical spine is tender to palpation over paraspinal muscles and with the ROM  Lungs:  Normal respiratory effort, chest expands symmetrically. Lungs are clear to auscultation, no crackles or wheezes. Heart:  Normal rate and regular rhythm. S1 and S2 normal without gallop, murmur, click, rub or other extra sounds. Abdomen:  Bowel sounds positive,abdomen soft and non-tender without masses, organomegaly or hernias noted. Genitalia:  Declined by pt Msk:  Lumbar-sacral spine is tender to palpation over paraspinal muscles and painfull with the ROM  Cervical spine is tender to palpation over paraspinal muscles and with the ROM. Str leg elev (-) B R hip mostly R SI and R troch major are   tender to palpation - not w/ROM  Extremities:  No edema. Neurologic:  No cranial nerve deficits noted. Station and gait are normal. Plantar reflexes are down-going bilaterally. DTRs are symmetrical throughout. Sensory, motor and coordinative functions appear intact. Skin:  Very tanned Mole 6 mm  on L anter chest Psych:  Oriented X3, depressed affect, less  tearful.     Impression & Recommendations:  Problem # 1:  HYPOTHYROIDISM (ICD-244.9) Assessment Unchanged  Her updated medication list for this problem includes:    Levothyroxine Sodium 50 Mcg Tabs (Levothyroxine sodium) ..... Once daily  Labs Reviewed: TSH: 1.31 (06/29/2009)    Chol: 177 (05/24/2008)   HDL: 45.4 (05/24/2008)   LDL: 126 (05/24/2008)   TG: 29 (05/24/2008)  Problem # 2:  FIBROMYALGIA (ICD-729.1) Assessment: Unchanged  Her updated medication list for this problem includes:    Tylox 5-500 Mg Caps (Oxycodone-acetaminophen) .Marland Kitchen... 1 by mouth two times a day prn    Nabumetone 500 Mg Tabs (Nabumetone) .Marland Kitchen... 1-2 by mouth once daily pc for arthritis/  pains  Problem # 3:  BIPOLAR DEPRESSION (ICD-296.7) Assessment: Unchanged On the regimen of medicine(s) reflected in the chart    Problem # 4:  VITAMIN B12 DEFICIENCY (ICD-266.2) Assessment: Unchanged On Rx  Problem # 5:  EXPOSURE TO HAZARDOUS BODY FLUID, HX OF (ICD-V15.85) Assessment: Comment Only She has refused pelvic exam; agreed for blood tests: Orders: T-HIV Antibody  (Reflex) ([id number]) T-Hepatitis C Antibody (46962-95284) T-Hepatitis B Surface Antibody (13244-01027) T-Hepatitis B Surface Antigen (25366-44034) T-Hepatitis B Core Antibody (74259-56387) Safe sex discussed  Complete Medication List: 1)  Xanax 1 Mg Tabs (Alprazolam) .... Three times a day 2)  Seroquel 25 Mg Tabs (Quetiapine fumarate) .... Once daily 3)  Adderall 20 Mg Tabs (Amphetamine-dextroamphetamine) .... Three times a day 4)  Levothyroxine Sodium 50 Mcg Tabs (Levothyroxine sodium) .... Once daily 5)  Promethazine Hcl  25 Mg Tabs (Promethazine hcl) .... As needed 6)  Proair Hfa 108 (90 Base) Mcg/act Aers (Albuterol sulfate) .... 2 inh q4h as needed shortness of breath 7)  Prevacid 30 Mg Cpdr (Lansoprazole) .... One by mouth daily 8)  Lidoderm 5 % Ptch (Lidocaine) .... Use 1-2 two times a day prn 9)  Tylox 5-500 Mg Caps (Oxycodone-acetaminophen) .Marland Kitchen.. 1 by mouth two times a day prn 10)  Cobal-1000 1000 Mcg/ml Soln (Cyanocobalamin) .Marland Kitchen.. 1 ml subcutaneously q 2 wks 11)  Vitamin D 86578 Unit Caps (Ergocalciferol) .Marland Kitchen.. 1 by mouth q 3-4 wks 12)  Tylenol Sinus Max St 30-500 Mg Tabs (Pseudoephedrine-apap) .... As needed 13)  Senokot S 8.6-50 Mg Tabs (Sennosides-docusate sodium) .Marland Kitchen.. 1 by mouth two times a day as needed constipation 14)  Pennsaid 1.5 % Soln (Diclofenac sodium) .... 3-5 gtt on a joint two times a day prn 15)  Nabumetone 500 Mg Tabs (Nabumetone) .Marland Kitchen.. 1-2 by mouth once daily pc for arthritis/ pains 16)  Nasacort Aq 55 Mcg/act Aers (Triamcinolone acetonide(nasal)) .Marland Kitchen.. 1 spray in each nostril daily  for rhinitis 17)  Relpax 20 Mg Tabs (Eletriptan hydrobromide) .... As directed  Patient Instructions: 1)  Please schedule a follow-up appointment in 3 months well w/labs. Prescriptions: RELPAX 20 MG TABS (ELETRIPTAN HYDROBROMIDE) as directed  #0 x 0   Entered and Authorized by:   Tresa Garter MD   Signed by:   Tresa Garter MD on 05/29/2010   Method used:   Print then Give to Patient   RxID:   [id number] NABUMETONE 500 MG TABS (NABUMETONE) 1-2 by mouth once daily pc for arthritis/ pains  #60 x 3   Entered and Authorized by:   Tresa Garter MD   Signed by:   Tresa Garter MD on 05/29/2010   Method used:   Print then Give to Patient   RxID:   [id number] VITAMIN D 25956 UNIT  CAPS (ERGOCALCIFEROL) 1 by mouth q 3-4 wks  #3 x 4   Entered and Authorized by:   Tresa Garter MD   Signed by:   Tresa Garter MD on 05/29/2010   Method used:   Print then Give to Patient   RxID:   [id number] COBAL-1000 1000 MCG/ML SOLN (CYANOCOBALAMIN) 1 ml subcutaneously q 2 wks  #10 ml x 6   Entered and Authorized by:   Tresa Garter MD   Signed by:   Tresa Garter MD on 05/29/2010   Method used:   Print then Give to Patient   RxID:   [id number] PREVACID 30 MG CPDR (LANSOPRAZOLE) one by mouth daily  #30 x 12   Entered and Authorized by:   Tresa Garter MD   Signed by:   Tresa Garter MD on 05/29/2010   Method used:   Print then Give to Patient   RxID:   [id number] PROAIR HFA 108 (90 BASE) MCG/ACT  AERS (ALBUTEROL SULFATE) 2 inh q4h as needed shortness of breath  #1 x 11   Entered and Authorized by:   Tresa Garter MD   Signed by:   Tresa Garter MD on 05/29/2010   Method used:   Print then Give to Patient   RxID:   [id number] LEVOTHYROXINE SODIUM 50 MCG  TABS (LEVOTHYROXINE SODIUM) once daily  #30 x 12   Entered and Authorized by:   Tresa Garter MD   Signed by:   Tresa Garter MD on  05/29/2010  Method used:   Print then Give to Patient   RxID:   [id number]

## 2010-11-30 NOTE — Letter (Signed)
Summary: Primary Care Appointment Letter  [facility]  [address] [place], [place] [address]   Phone: [phone]  Fax: [phone]    07/04/2010 MRN: [id number]  [facility] [address] [place], [place]  [address]  Dear Ms. Williamsen,   Your Primary Care Physician Tresa Garter MD has indicated that:    _______it is time to schedule an appointment.    _______you missed your appointment on______ and need to call and          reschedule.    _______you need to have lab work done.    _______you need to schedule an appointment discuss lab or test results.    ___x_____you need to call to reschedule your appointment that is                       scheduled on 08/18/10 @ 4P w/Dr Plotnikov.     Please call our office as soon as possible. Our phone number is [phone]. Please press option 1. Our office is open 8a-12noon and 1p-5p, Monday through Friday.     Thank you,    [facility]

## 2010-11-30 NOTE — Progress Notes (Signed)
Summary: Rf Hydroco/APAP  Phone Note Refill Request Message from:  Fax from Pharmacy  Refills Requested: Medication #1:  HYDROCODONE-ACETAMINOPHEN 5-325 MG TABS 1-2 by mouth two times a day as needed pain   Dosage confirmed as above?Dosage Confirmed   Supply Requested: 90   Last Refilled: 09/02/2010  Method Requested: Telephone to Pharmacy Next Appointment Scheduled: 12-01-10 Initial call taken by: Lanier Prude, [facility]),  September 28, 2010 11:48 AM  Follow-up for Phone Call        ok to ref  Follow-up by: Tresa Garter MD,  September 28, 2010 1:01 PM  Additional Follow-up for Phone Call Additional follow up Details #1::        Rx called to pharmacy Additional Follow-up by: Lanier Prude, [facility]),  September 28, 2010 2:24 PM    Prescriptions: HYDROCODONE-ACETAMINOPHEN 5-325 MG TABS (HYDROCODONE-ACETAMINOPHEN) 1-2 by mouth two times a day as needed pain  #90 x 0   Entered by:   Lanier Prude, CMA(AAMA)   Authorized by:   Tresa Garter MD   Signed by:   Lanier Prude, CMA(AAMA) on 09/28/2010   Method used:   Telephoned to ...       [facility]* (retail)       [address]       [place], [place]  [address]       Ph: [phone]       Fax: [phone]   RxID:   [id number]

## 2010-11-30 NOTE — Progress Notes (Signed)
Summary: RX Issue  Phone Note Call from Patient   Summary of Call: Pt left specific message that she only wants to speak with Dr Posey Rea regarding a refill.  Initial call taken by: Lamar Sprinkles, CMA,  May 17, 2010 11:11 AM  Follow-up for Phone Call        I believe this is regarding the last phone note about the thyroid medication. Her insurance will not let her fill early and she will be out of town when she is due for refill. I have samples of synthroid for patient to pick up. This should resolve the issue. Attempted to call patient but no answer, no vm. Follow-up by: Lamar Sprinkles, CMA,  May 17, 2010 5:36 PM  Additional Follow-up for Phone Call Additional follow up Details #1::        No answer no vm, samples are at my desk............Marland KitchenLamar Sprinkles, CMA  May 17, 2010 6:23 PM   no answer, no vm...................Marland KitchenLamar Sprinkles, CMA  May 18, 2010 9:40 AM   Pt informed of offer of samples. Pt refused samples and said she will "work it out" Additional Follow-up by: Lamar Sprinkles, CMA,  May 18, 2010 4:24 PM

## 2010-11-30 NOTE — Assessment & Plan Note (Signed)
Summary: 2 MTH FU STC   Vital Signs:  Patient profile:   55 year old female Weight:      144 pounds BMI:     23.33 Temp:     97.3 degrees F oral Pulse rate:   94 / minute BP sitting:   114 / 74  (left arm)  Vitals Entered By: Tora Perches (December 05, 2009 2:05 PM) CC: f/u Is Patient Diabetic? No   CC:  f/u.  History of Present Illness: The patient presents for a follow up of back pain, anxiety, depression, FMS and headaches. C/o pain worsened in R LS spine down the R leg, severe. She tried Relafen in the past - seemed to help... The patient presents with complaints of sore throat, fever, cough, sinus congestion and drainge of several days duration. Not better with OTC meds. Chest hurts with coughing.   The mucus is colored.   Current Medications (verified): 1)  Xanax 1 Mg  Tabs (Alprazolam) .... Two Times A Day-Tid 2)  Seroquel 25 Mg  Tabs (Quetiapine Fumarate) .... Once Daily 3)  Adderall 20 Mg  Tabs (Amphetamine-Dextroamphetamine) .... Three Times A Day 4)  Levothyroxine Sodium 50 Mcg  Tabs (Levothyroxine Sodium) .... Once Daily 5)  Promethazine Hcl 25 Mg  Tabs (Promethazine Hcl) .... As Needed 6)  Proair Hfa 108 (90 Base) Mcg/act  Aers (Albuterol Sulfate) .... 2 Inh Q4h As Needed Shortness of Breath 7)  Prevacid 30 Mg Cpdr (Lansoprazole) .... One By Mouth Daily 8)  Lidoderm 5 % Ptch (Lidocaine) .... Use 1-2 Two Times A Day Prn 9)  Tylox 5-500 Mg Caps (Oxycodone-Acetaminophen) .Marland Kitchen.. 1 By Mouth Two Times A Day Prn 10)  Cobal-1000 1000 Mcg/ml Soln (Cyanocobalamin) .Marland Kitchen.. 1 Ml Subcutaneously Q 2 Wks 11)  Vitamin D 40981 Unit  Caps (Ergocalciferol) .Marland Kitchen.. 1 By Mouth Q 3-4 Wks 12)  Tylenol Sinus Max St 30-500 Mg Tabs (Pseudoephedrine-Apap) .... As Needed 13)  Senokot S 8.6-50 Mg Tabs (Sennosides-Docusate Sodium) .Marland Kitchen.. 1 By Mouth Two Times A Day As Needed Constipation 14)  Pennsaid 1.5 % Soln (Diclofenac Sodium) .... 3-5 Gtt On A Joint Two Times A Day Prn 15)  Imitrex 100 Mg Tabs  (Sumatriptan Succinate) .Marland Kitchen.. 1 By Mouth Once Daily As Needed Migraine / Insurance Will Cover 27 Tabs Every 90 Days 16)  Diflucan 100 Mg Tabs (Fluconazole) .Marland Kitchen.. 1 By Mouth Once Daily Once As Needed Vaginitis  Allergies: 1)  ! Sulfadiazine (Sulfadiazine) 2)  ! Codeine Sulfate (Codeine Sulfate) 3)  ! Doxycycline Hyclate (Doxycycline Hyclate) 4)  ! Tetracycline Hcl (Tetracycline Hcl) 5)  ! Morphine Sulfate Cr (Morphine Sulfate) 6)  ! Ultram Er (Tramadol Hcl) 7)  ! Hydrocodone-Ibuprofen (Hydrocodone-Ibuprofen)  Past History:  Past Medical History: Last updated: 11/02/2008 Depression, bipolar  Dr Evelene Croon Anxiety Low back pain   Dr Lovell Sheehan GERD FMS   [facility] in [place] Vit B12 def Dr Amanda Pea  Social History: Last updated: 09/02/2009 Divorced 2010 Not working, got disability 2009 Current Smoker Regular exercise-no  Physical Exam  General:  NAD, looks less tired Eyes:  No corneal or conjunctival inflammation noted. EOMI. Perrla. Ears:  External ear exam shows no significant lesions or deformities.   Mouth:  Oral mucosa and oropharynx without lesions or exudates.  Teeth in good repair. Breasts:  No mass, nodules, thickening, tenderness, bulging, retraction, inflamation, nipple discharge or skin changes noted.   Lungs:  Normal respiratory effort, chest expands symmetrically. Lungs are clear to auscultation, no crackles or wheezes. Heart:  Normal rate and regular rhythm. S1 and S2 normal without gallop, murmur, click, rub or other extra sounds. Abdomen:  Bowel sounds positive,abdomen soft and non-tender without masses, organomegaly or hernias noted. Msk:  Lumbar-sacral spine is tender to palpation over paraspinal muscles and painfull with the ROM  Cervical spine is tender to palpation over paraspinal muscles and with the ROM. Str leg elev (-) B R hip mostly R SI and R troch major are   tender to palpation - not w/ROM  Extremities:  No edema. Neurologic:  No cranial nerve deficits noted.  Station and gait are normal. Plantar reflexes are down-going bilaterally. DTRs are symmetrical throughout. Sensory, motor and coordinative functions appear intact. Skin:  Intact without suspicious lesions or rashes small bruises on UEs and LEs Psych:  Oriented X3, depressed affect, not  tearful.     Impression & Recommendations:  Problem # 1:  LOW BACK PAIN (ICD-724.2) Assessment Unchanged  Her updated medication list for this problem includes:    Tylox 5-500 Mg Caps (Oxycodone-acetaminophen) .Marland Kitchen... 1 by mouth two times a day prn    Nabumetone 500 Mg Tabs (Nabumetone) .Marland Kitchen... 1-2 by mouth once daily pc for arthritis/ pains  Problem # 2:  FIBROMYALGIA (ICD-729.1) Assessment: Deteriorated  Her updated medication list for this problem includes:    Tylox 5-500 Mg Caps (Oxycodone-acetaminophen) .Marland Kitchen... 1 by mouth two times a day prn    Nabumetone 500 Mg Tabs (Nabumetone) .Marland Kitchen... 1-2 by mouth once daily pc for arthritis/ pains  Problem # 3:  HIP PAIN (ICD-719.45) R in the SI joint, MSK Assessment: Deteriorated  Her updated medication list for this problem includes:    Tylox 5-500 Mg Caps (Oxycodone-acetaminophen) .Marland Kitchen... 1 by mouth two times a day prn    Nabumetone 500 Mg Tabs (Nabumetone) .Marland Kitchen... 1-2 by mouth once daily pc for arthritis/ pains  Orders: Prescription Created Electronically [phone])  Problem # 4:  VITAMIN B12 DEFICIENCY (ICD-266.2) Assessment: Unchanged On prescription therapy   Problem # 5:  BIPOLAR DEPRESSION (ICD-296.7) Assessment: Unchanged On prescription therapy   Problem # 6:  BRONCHITIS, ACUTE (ICD-466.0)  Her updated medication list for this problem includes:    Proair Hfa 108 (90 Base) Mcg/act Aers (Albuterol sulfate) .Marland Kitchen... 2 inh q4h as needed shortness of breath    Tylenol Sinus Max St 30-500 Mg Tabs (Pseudoephedrine-apap) .Marland Kitchen... As needed    Ceftin 500 Mg Tabs (Cefuroxime axetil) .Marland Kitchen... 1 by mouth bid  Complete Medication List: 1)  Xanax 1 Mg Tabs (Alprazolam)  .... Two times a day-tid 2)  Seroquel 25 Mg Tabs (Quetiapine fumarate) .... Once daily 3)  Adderall 20 Mg Tabs (Amphetamine-dextroamphetamine) .... Three times a day 4)  Levothyroxine Sodium 50 Mcg Tabs (Levothyroxine sodium) .... Once daily 5)  Promethazine Hcl 25 Mg Tabs (Promethazine hcl) .... As needed 6)  Proair Hfa 108 (90 Base) Mcg/act Aers (Albuterol sulfate) .... 2 inh q4h as needed shortness of breath 7)  Prevacid 30 Mg Cpdr (Lansoprazole) .... One by mouth daily 8)  Lidoderm 5 % Ptch (Lidocaine) .... Use 1-2 two times a day prn 9)  Tylox 5-500 Mg Caps (Oxycodone-acetaminophen) .Marland Kitchen.. 1 by mouth two times a day prn 10)  Cobal-1000 1000 Mcg/ml Soln (Cyanocobalamin) .Marland Kitchen.. 1 ml subcutaneously q 2 wks 11)  Vitamin D 62130 Unit Caps (Ergocalciferol) .Marland Kitchen.. 1 by mouth q 3-4 wks 12)  Tylenol Sinus Max St 30-500 Mg Tabs (Pseudoephedrine-apap) .... As needed 13)  Senokot S 8.6-50 Mg Tabs (Sennosides-docusate sodium) .Marland Kitchen.. 1 by mouth  two times a day as needed constipation 14)  Pennsaid 1.5 % Soln (Diclofenac sodium) .... 3-5 gtt on a joint two times a day prn 15)  Imitrex 100 Mg Tabs (Sumatriptan succinate) .Marland Kitchen.. 1 by mouth once daily as needed migraine / insurance will cover 27 tabs every 90 days 16)  Diflucan 100 Mg Tabs (Fluconazole) .Marland Kitchen.. 1 by mouth once daily once as needed vaginitis 17)  Nabumetone 500 Mg Tabs (Nabumetone) .Marland Kitchen.. 1-2 by mouth once daily pc for arthritis/ pains 18)  Nasacort Aq 55 Mcg/act Aers (Triamcinolone acetonide(nasal)) .Marland Kitchen.. 1 spray in each nostril daily for rhinitis 19)  Ceftin 500 Mg Tabs (Cefuroxime axetil) .Marland Kitchen.. 1 by mouth bid  Patient Instructions: 1)  Please schedule a follow-up appointment in 2 months. 2)  BMP prior to visit, ICD-9: 3)  Hepatic Panel prior to visit, ICD-9: 4)  TSH prior to visit, ICD-9: 5)  Vit B12 6)  266.20 7)  Vit D 268.9  995.20 8)  Use stretching exercises that I have provided (15 min. or longer every day) 9)  Use the Sinus rinse as  needed Prescriptions: COBAL-1000 1000 MCG/ML SOLN (CYANOCOBALAMIN) 1 ml subcutaneously q 2 wks  #10 ml x 6   Entered and Authorized by:   Tresa Garter MD   Signed by:   Tresa Garter MD on 12/05/2009   Method used:   Electronically to        [facility]* (retail)       [address]       [place], [place]  [address]       Ph: [phone]       Fax: [phone]   RxID:   [id number] CEFTIN 500 MG TABS (CEFUROXIME AXETIL) 1 by mouth bid  #20 x 1   Entered and Authorized by:   Tresa Garter MD   Signed by:   Tresa Garter MD on 12/05/2009   Method used:   Electronically to        [facility]* (retail)       [address]       [place], [place]  [address]       Ph: [phone]       Fax: [phone]   RxID:   [id number] NASACORT AQ 55 MCG/ACT AERS (TRIAMCINOLONE ACETONIDE(NASAL)) 1 spray in each nostril daily for rhinitis  #1 x 12   Entered and Authorized by:   Tresa Garter MD   Signed by:   Tresa Garter MD on 12/05/2009   Method used:   Electronically to        [facility]* (retail)       [address]       [place], [place]  [address]       Ph: [phone]       Fax: [phone]   RxID:   [id number] PROAIR HFA 108 (90 BASE) MCG/ACT  AERS (ALBUTEROL SULFATE) 2 inh q4h as needed shortness of breath  #1 x 11   Entered and Authorized by:   Tresa Garter MD   Signed by:   Tresa Garter MD on 12/05/2009   Method used:   Electronically to        [facility]* (retail)       [address]       [place]  [place], [place]  [address]       Ph: [phone]       Fax: [phone]   RxID:   [id number] NABUMETONE 500 MG TABS (NABUMETONE) 1-2 by mouth once daily pc for arthritis/ pains  #60 x 3   Entered and Authorized by:   Tresa Garter MD   Signed by:   Tresa Garter MD on 12/05/2009   Method used:   Electronically to        [facility]* (retail)        [address]       [place], [place]  [address]       Ph: [phone]       Fax: [phone]   RxID:   [id number] NABUMETONE 500 MG TABS (NABUMETONE) 1-2 by mouth once daily pc for arthritis/ pains  #60 x 3   Entered and Authorized by:   Tresa Garter MD   Signed by:   Tresa Garter MD on 12/05/2009   Method used:   Print then Give to Patient   RxID:   [id number]

## 2010-11-30 NOTE — Progress Notes (Signed)
Summary: ? B12  Phone Note Call from Patient Call back at [phone]   Caller: Patient Summary of Call: Pt left message regarding B12 pt is unsure how to take in pill form, pt has been receiving B12 injections?-Message unclear-left message for pt to callback office for clarification-pt also requesting rx for Nasal Spray that Medicare will cover-pt had rx for Nasocort on file at [facility]-spoke with pharmacist-they will refill generic version of rx that pt had on file from 12/05/09 Initial call taken by: Brenton Grills CMA [facility] Dull),  October 19, 2010 11:31 AM  Follow-up for Phone Call        pt states that Medicare will no longer cover the B12 injections and that she had started taking OTC B12 tabs and splitting them in quarters. Pt states that she has felt tired and run down since stopping the injections and wants to know what she can do regarding taking B12 in pill form-please advise Follow-up by: Brenton Grills CMA [facility]ull),  October 19, 2010 11:48 AM  Additional Follow-up for Phone Call Additional follow up Details #1::        B12 1000 micrograms sl qd Additional Follow-up by: Tresa Garter MD,  October 19, 2010 1:20 PM    Additional Follow-up for Phone Call Additional follow up Details #2::    pt informed Follow-up by: Brenton Grills CMA Duncan Dull),  October 19, 2010 1:28 PM  New/Updated Medications: VITAMIN B-12 1000 MCG SUBL (CYANOCOBALAMIN) 1 by mouth qd Prescriptions: VITAMIN B-12 1000 MCG SUBL (CYANOCOBALAMIN) 1 by mouth qd  #100 x 3   Entered and Authorized by:   Tresa Garter MD   Signed by:   Brenton Grills CMA (AAMA) on 10/19/2010   Method used:   Electronically to        [facility]* (retail)       [address]       [place], [place]  [address]       Ph: [phone]       Fax: [phone]   RxID:   [id number]

## 2010-11-30 NOTE — Progress Notes (Signed)
Summary: [employer] pa  Phone Note Call from Patient Call back at [facility] Phone [phone]   Summary of Call: Patient called stating that [employer] needs prior authorization. I made patient aware that we have not received a rejected claim from her pharmacy. Patient will have pharmacy fax it to our office. Initial call taken by: Lucious Groves,  Mar 01, 2010 1:20 PM  Follow-up for Phone Call        prior authorization completed online and approved until 08-29-10. Follow-up by: Lucious Groves,  Mar 02, 2010 9:58 AM

## 2010-11-30 NOTE — Assessment & Plan Note (Signed)
Summary: 2 MTH FU STC   Vital Signs:  Patient profile:   55 year old female Height:      66 inches (167.64 cm) Weight:      140.25 pounds (63.75 kg) O2 Sat:      98 % on Room air Temp:     98.8 degrees F (37.11 degrees C) oral Pulse rate:   80 / minute BP sitting:   118 / 72  (left arm) Cuff size:   regular  Vitals Entered By: Josph Macho RMA (February 24, 2010 3:42 PM)  O2 Flow:  Room air CC: Follow-up visit/ CF Is Patient Diabetic? No   CC:  Follow-up visit/ CF.  History of Present Illness: The patient presents for a follow up of back pain, anxiety, depression and headaches. Stressed out. She started Nabumeton - it helped...however she was afraid to take it.  She wants to stop smoking...  Current Medications (verified): 1)  Xanax 1 Mg  Tabs (Alprazolam) .... Two Times A Day-Tid 2)  Seroquel 25 Mg  Tabs (Quetiapine Fumarate) .... Once Daily 3)  Adderall 20 Mg  Tabs (Amphetamine-Dextroamphetamine) .... Three Times A Day 4)  Levothyroxine Sodium 50 Mcg  Tabs (Levothyroxine Sodium) .... Once Daily 5)  Promethazine Hcl 25 Mg  Tabs (Promethazine Hcl) .... As Needed 6)  Proair Hfa 108 (90 Base) Mcg/act  Aers (Albuterol Sulfate) .... 2 Inh Q4h As Needed Shortness of Breath 7)  Prevacid 30 Mg Cpdr (Lansoprazole) .... One By Mouth Daily 8)  Lidoderm 5 % Ptch (Lidocaine) .... Use 1-2 Two Times A Day Prn 9)  Tylox 5-500 Mg Caps (Oxycodone-Acetaminophen) .Marland Kitchen.. 1 By Mouth Two Times A Day Prn 10)  Cobal-1000 1000 Mcg/ml Soln (Cyanocobalamin) .Marland Kitchen.. 1 Ml Subcutaneously Q 2 Wks 11)  Vitamin D 01027 Unit  Caps (Ergocalciferol) .Marland Kitchen.. 1 By Mouth Q 3-4 Wks 12)  Tylenol Sinus Max St 30-500 Mg Tabs (Pseudoephedrine-Apap) .... As Needed 13)  Senokot S 8.6-50 Mg Tabs (Sennosides-Docusate Sodium) .Marland Kitchen.. 1 By Mouth Two Times A Day As Needed Constipation 14)  Pennsaid 1.5 % Soln (Diclofenac Sodium) .... 3-5 Gtt On A Joint Two Times A Day Prn 15)  Imitrex 100 Mg Tabs (Sumatriptan Succinate) .Marland Kitchen.. 1 By Mouth  Once Daily As Needed Migraine / Insurance Will Cover 27 Tabs Every 90 Days 16)  Diflucan 100 Mg Tabs (Fluconazole) .Marland Kitchen.. 1 By Mouth Once Daily Once As Needed Vaginitis 17)  Nabumetone 500 Mg Tabs (Nabumetone) .Marland Kitchen.. 1-2 By Mouth Once Daily Pc For Arthritis/ Pains 18)  Nasacort Aq 55 Mcg/act Aers (Triamcinolone Acetonide(Nasal)) .Marland Kitchen.. 1 Spray in Each Nostril Daily For Rhinitis  Allergies (verified): 1)  ! Sulfadiazine (Sulfadiazine) 2)  ! Codeine Sulfate (Codeine Sulfate) 3)  ! Doxycycline Hyclate (Doxycycline Hyclate) 4)  ! Tetracycline Hcl (Tetracycline Hcl) 5)  ! Morphine Sulfate Cr (Morphine Sulfate) 6)  ! Ultram Er (Tramadol Hcl) 7)  ! Hydrocodone-Ibuprofen (Hydrocodone-Ibuprofen)  Past History:  Past Medical History: Last updated: 11/02/2008 Depression, bipolar  Dr Evelene Croon Anxiety Low back pain   Dr Lovell Sheehan GERD FMS   [facility] in [place] Vit B12 def Dr Amanda Pea  Social History: Last updated: 09/02/2009 Divorced 2010 Not working, got disability 2009 Current Smoker Regular exercise-no  Review of Systems  The patient denies fever, dyspnea on exertion, abdominal pain, and melena.    Physical Exam  General:  NAD, looks less tired Nose:  External nasal examination shows no deformity or inflammation. Nasal mucosa are pink and moist without lesions or exudates. Mouth:  Oral mucosa and oropharynx without lesions or exudates.  Teeth in good repair. Neck:  Cervical spine is tender to palpation over paraspinal muscles and with the ROM  Lungs:  Normal respiratory effort, chest expands symmetrically. Lungs are clear to auscultation, no crackles or wheezes. Heart:  Normal rate and regular rhythm. S1 and S2 normal without gallop, murmur, click, rub or other extra sounds. Abdomen:  Bowel sounds positive,abdomen soft and non-tender without masses, organomegaly or hernias noted. Msk:  Lumbar-sacral spine is tender to palpation over paraspinal muscles and painfull with the ROM  Cervical spine  is tender to palpation over paraspinal muscles and with the ROM. Str leg elev (-) B R hip mostly R SI and R troch major are   tender to palpation - not w/ROM  Neurologic:  No cranial nerve deficits noted. Station and gait are normal. Plantar reflexes are down-going bilaterally. DTRs are symmetrical throughout. Sensory, motor and coordinative functions appear intact. Skin:  Intact without suspicious lesions or rashes small bruises on UEs and LEs Mole 6 mm  on L anter chest Cervical Nodes:  No lymphadenopathy noted Inguinal Nodes:  No significant adenopathy Psych:  Oriented X3, depressed affect, less  tearful.     Impression & Recommendations:  Problem # 1:  HIP PAIN (ICD-719.45) Assessment Improved  Her updated medication list for this problem includes:    Tylox 5-500 Mg Caps (Oxycodone-acetaminophen) .Marland Kitchen... 1 by mouth two times a day prn    Nabumetone 500 Mg Tabs (Nabumetone) .Marland Kitchen... 1-2 by mouth once daily pc for arthritis/ pains  Problem # 2:  HYPOTHYROIDISM (ICD-244.9) Assessment: Unchanged  Her updated medication list for this problem includes:    Levothyroxine Sodium 50 Mcg Tabs (Levothyroxine sodium) ..... Once daily  Problem # 3:  LOW BACK PAIN (ICD-724.2) Assessment: Unchanged  Her updated medication list for this problem includes:    Tylox 5-500 Mg Caps (Oxycodone-acetaminophen) .Marland Kitchen... 1 by mouth two times a day prn    Nabumetone 500 Mg Tabs (Nabumetone) .Marland Kitchen... 1-2 by mouth once daily pc for arthritis/ pains  Problem # 4:  FIBROMYALGIA (ICD-729.1) Assessment: Unchanged  Her updated medication list for this problem includes:    Tylox 5-500 Mg Caps (Oxycodone-acetaminophen) .Marland Kitchen... 1 by mouth two times a day prn    Nabumetone 500 Mg Tabs (Nabumetone) .Marland Kitchen... 1-2 by mouth once daily pc for arthritis/ pains  Problem # 5:  BIPOLAR DEPRESSION (ICD-296.7) Assessment: Deteriorated Appt w/Dr Evelene Croon is pending today at 5 pm  Problem # 6:  TOBACCO USER (ICD-305.1) Assessment:  Unchanged  Her updated medication list for this problem includes:    Chantix Continuing Month Pak 1 Mg Tabs (Varenicline tartrate) .Marland Kitchen... As dirrected    Chantix Starting Month Pak 0.5 Mg X 11 & 1 Mg X 42 Tabs (Varenicline tartrate) .Marland Kitchen... As dirrected  Encouraged smoking cessation and discussed different methods for smoking cessation.   Problem # 7:  NEOPLASM, SKIN, UNCERTAIN BEHAVIOR (ICD-238.2) L chest Assessment: New Bx adviced Reduce or stop tanning  Complete Medication List: 1)  Xanax 1 Mg Tabs (Alprazolam) .... Two times a day-tid 2)  Seroquel 25 Mg Tabs (Quetiapine fumarate) .... Once daily 3)  Adderall 20 Mg Tabs (Amphetamine-dextroamphetamine) .... Three times a day 4)  Levothyroxine Sodium 50 Mcg Tabs (Levothyroxine sodium) .... Once daily 5)  Promethazine Hcl 25 Mg Tabs (Promethazine hcl) .... As needed 6)  Proair Hfa 108 (90 Base) Mcg/act Aers (Albuterol sulfate) .... 2 inh q4h as needed shortness of breath 7)  Prevacid  30 Mg Cpdr (Lansoprazole) .... One by mouth daily 8)  Lidoderm 5 % Ptch (Lidocaine) .... Use 1-2 two times a day prn 9)  Tylox 5-500 Mg Caps (Oxycodone-acetaminophen) .Marland Kitchen.. 1 by mouth two times a day prn 10)  Cobal-1000 1000 Mcg/ml Soln (Cyanocobalamin) .Marland Kitchen.. 1 ml subcutaneously q 2 wks 11)  Vitamin D 04540 Unit Caps (Ergocalciferol) .Marland Kitchen.. 1 by mouth q 3-4 wks 12)  Tylenol Sinus Max St 30-500 Mg Tabs (Pseudoephedrine-apap) .... As needed 13)  Senokot S 8.6-50 Mg Tabs (Sennosides-docusate sodium) .Marland Kitchen.. 1 by mouth two times a day as needed constipation 14)  Pennsaid 1.5 % Soln (Diclofenac sodium) .... 3-5 gtt on a joint two times a day prn 15)  Imitrex 100 Mg Tabs (Sumatriptan succinate) .Marland Kitchen.. 1 by mouth once daily as needed migraine / insurance will cover 27 tabs every 90 days 16)  Diflucan 100 Mg Tabs (Fluconazole) .Marland Kitchen.. 1 by mouth once daily once as needed vaginitis 17)  Nabumetone 500 Mg Tabs (Nabumetone) .Marland Kitchen.. 1-2 by mouth once daily pc for arthritis/ pains 18)   Nasacort Aq 55 Mcg/act Aers (Triamcinolone acetonide(nasal)) .Marland Kitchen.. 1 spray in each nostril daily for rhinitis 19)  Chantix Continuing Month Pak 1 Mg Tabs (Varenicline tartrate) .... As dirrected 20)  Chantix Starting Month Pak 0.5 Mg X 11 & 1 Mg X 42 Tabs (Varenicline tartrate) .... As dirrected  Patient Instructions: 1)  Please schedule a follow-up appointment in 3 months well w/labs v70.0 and Vit B12 266.20. Prescriptions: CHANTIX STARTING MONTH PAK 0.5 MG X 11 & 1 MG X 42 TABS (VARENICLINE TARTRATE) as dirrected  #1 x 0   Entered and Authorized by:   Tresa Garter MD   Signed by:   Lamar Sprinkles, CMA on 02/24/2010   Method used:   Electronically to        [facility]* (retail)       [address]       [place], [place]  [address]       Ph: [phone]       Fax: [phone]   RxID:   [id number] CHANTIX CONTINUING MONTH PAK 1 MG TABS (VARENICLINE TARTRATE) as dirrected  #1 x 5   Entered and Authorized by:   Tresa Garter MD   Signed by:   Lamar Sprinkles, CMA on 02/24/2010   Method used:   Electronically to        [facility]* (retail)       [address]       [place], [place]  [address]       Ph: [phone]       Fax: [phone]   RxID:   [id number]

## 2010-11-30 NOTE — Progress Notes (Signed)
Summary: REFERRAL  Phone Note Call from Patient   Summary of Call: Patient is requesting referral to [facility] for physical therapy. Fax # is [phone]. If ok, please put in referral.  Initial call taken by: Lamar Sprinkles, CMA,  September 12, 2010 3:22 PM  Follow-up for Phone Call        ok Follow-up by: Tresa Garter MD,  September 12, 2010 5:31 PM

## 2010-12-01 ENCOUNTER — Encounter: Payer: Self-pay | Admitting: Internal Medicine

## 2010-12-01 ENCOUNTER — Ambulatory Visit (INDEPENDENT_AMBULATORY_CARE_PROVIDER_SITE_OTHER): Payer: Medicare Other | Admitting: Internal Medicine

## 2010-12-01 ENCOUNTER — Ambulatory Visit: Admit: 2010-12-01 | Payer: Self-pay | Admitting: Internal Medicine

## 2010-12-01 DIAGNOSIS — M25559 Pain in unspecified hip: Secondary | ICD-10-CM

## 2010-12-01 DIAGNOSIS — M545 Low back pain: Secondary | ICD-10-CM

## 2010-12-01 DIAGNOSIS — J019 Acute sinusitis, unspecified: Secondary | ICD-10-CM

## 2010-12-01 DIAGNOSIS — L299 Pruritus, unspecified: Secondary | ICD-10-CM | POA: Insufficient documentation

## 2010-12-01 DIAGNOSIS — IMO0001 Reserved for inherently not codable concepts without codable children: Secondary | ICD-10-CM

## 2010-12-06 NOTE — Assessment & Plan Note (Signed)
Summary: 3 MTH FU/STC   Vital Signs:  Patient profile:   55 year old female Height:      66 inches Weight:      156 pounds BMI:     25.27 Temp:     97.8 degrees F oral Pulse rate:   96 / minute Pulse rhythm:   regular Resp:     16 per minute BP sitting:   138 / 90  (left arm) Cuff size:   regular  Vitals Entered By: Lanier Prude, CMA(AAMA) (December 01, 2010 4:47 PM) CC: 3 mo f/u  Is Patient Diabetic? No   CC:  3 mo f/u .  History of Present Illness: The patient presents for a follow up of FMS,  back pain, anxiety, depression and headaches. C/o sinus congestion and d/c x 2-3 wks  Current Medications (verified): 1)  Xanax 1 Mg  Tabs (Alprazolam) .... Three Times A Day 2)  Seroquel 25 Mg  Tabs (Quetiapine Fumarate) .... Once Daily 3)  Adderall 20 Mg  Tabs (Amphetamine-Dextroamphetamine) .... Three Times A Day 4)  Levothyroxine Sodium 50 Mcg  Tabs (Levothyroxine Sodium) .... Once Daily 5)  Promethazine Hcl 25 Mg  Tabs (Promethazine Hcl) .... As Needed 6)  Proair Hfa 108 (90 Base) Mcg/act  Aers (Albuterol Sulfate) .... 2 Inh Q4h As Needed Shortness of Breath 7)  Prevacid 30 Mg Cpdr (Lansoprazole) .... One By Mouth Daily 8)  Lidoderm 5 % Ptch (Lidocaine) .... Use 1-2 Two Times A Day Prn 9)  Vitamin D 36644 Unit  Caps (Ergocalciferol) .Marland Kitchen.. 1 By Mouth Q 3-4 Wks 10)  Tylenol Sinus Max St 30-500 Mg Tabs (Pseudoephedrine-Apap) .... As Needed 11)  Senokot S 8.6-50 Mg Tabs (Sennosides-Docusate Sodium) .Marland Kitchen.. 1 By Mouth Two Times A Day As Needed Constipation 12)  Pennsaid 1.5 % Soln (Diclofenac Sodium) .... 3-5 Gtt On A Joint Two Times A Day Prn 13)  Nasacort Aq 55 Mcg/act Aers (Triamcinolone Acetonide(Nasal)) .Marland Kitchen.. 1 Spray in Each Nostril Daily For Rhinitis 14)  Relpax 20 Mg Tabs (Eletriptan Hydrobromide) .... As Directed 15)  Hydrocodone-Acetaminophen 5-325 Mg Tabs (Hydrocodone-Acetaminophen) .Marland Kitchen.. 1-2 By Mouth Two Times A Day As Needed Pain 16)  Naproxen 500 Mg Tabs (Naproxen) .Marland Kitchen.. 1 By  Mouth Two Times A Day Pc For Pain/arthritis 17)  Loratadine 10 Mg Tabs (Loratadine) .Marland Kitchen.. 1 By Mouth Once Daily As Needed Allergies 18)  Vitamin B-12 1000 Mcg Subl (Cyanocobalamin) .Marland Kitchen.. 1 By Mouth Qd  Allergies (verified): 1)  ! Sulfadiazine (Sulfadiazine) 2)  ! Codeine Sulfate (Codeine Sulfate) 3)  ! Doxycycline Hyclate (Doxycycline Hyclate) 4)  ! Tetracycline Hcl (Tetracycline Hcl) 5)  ! Morphine Sulfate Cr (Morphine Sulfate) 6)  ! Ultram Er (Tramadol Hcl) 7)  Nabumetone (Nabumetone)  Past History:  Past Medical History: Last updated: 11/02/2008 Depression, bipolar  Dr Evelene Croon Anxiety Low back pain   Dr Lovell Sheehan GERD FMS   [facility] in [place] Vit B12 def Dr Amanda Pea  Social History: Last updated: 09/01/2010 Divorced 2010 remarried 2011 Not working, got disability 2009 Current Smoker Regular exercise-no  Review of Systems       The patient complains of difficulty walking, depression, and weight gain.  The patient denies fever, chest pain, and dyspnea on exertion.         Achy  Physical Exam  General:  NAD, looks less tired Head:  Normocephalic and atraumatic without obvious abnormalities. No apparent alopecia or balding. Eyes:  No corneal or conjunctival inflammation noted. EOMI. Perrla. Nose:  External  nasal examination shows no deformity or inflammation. Nasal mucosa are pink and moist without lesions or exudates. Mouth:  Oral mucosa and oropharynx without lesions or exudates.  Teeth in good repair. Neck:  Cervical spine is tender to palpation over paraspinal muscles and with the ROM  Lungs:  Normal respiratory effort, chest expands symmetrically. Lungs are clear to auscultation, no crackles or wheezes. Heart:  Normal rate and regular rhythm. S1 and S2 normal without gallop, murmur, click, rub or other extra sounds. Abdomen:  Bowel sounds positive,abdomen soft and non-tender without masses, organomegaly or hernias noted. Msk:  Lumbar-sacral spine is tender to palpation  over paraspinal muscles and painfull with the ROM  Cervical spine is tender to palpation over paraspinal muscles and with the ROM. Str leg elev (-) B R hip mostly R SI and R troch major are   tender to palpation - not w/ROM  Extremities:  No edema Neurologic:  No cranial nerve deficits noted. Station and gait are normal. Plantar reflexes are down-going bilaterally. DTRs are symmetrical throughout. Sensory, motor and coordinative functions appear intact. Skin:  Very tanned Mole 6 mm  on L anter chest Excoriations under L axilla Psych:  Oriented X3, depressed affect, not  tearful.  not suicidal.     Impression & Recommendations:  Problem # 1:  LOW BACK PAIN (ICD-724.2) Assessment Unchanged  Her updated medication list for this problem includes:    Hydrocodone-acetaminophen 5-325 Mg Tabs (Hydrocodone-acetaminophen) .Marland Kitchen... 1-2 by mouth two times a day as needed pain    Naproxen 500 Mg Tabs (Naproxen) .Marland Kitchen... 1 by mouth two times a day pc for pain/arthritis  Orders: Physical Therapy Referral (PT)  Problem # 2:  FIBROMYALGIA (ICD-729.1) Assessment: Unchanged  Her updated medication list for this problem includes:    Hydrocodone-acetaminophen 5-325 Mg Tabs (Hydrocodone-acetaminophen) .Marland Kitchen... 1-2 by mouth two times a day as needed pain    Naproxen 500 Mg Tabs (Naproxen) .Marland Kitchen... 1 by mouth two times a day pc for pain/arthritis  Orders: Physical Therapy Referral (PT)  Problem # 3:  VITAMIN B12 DEFICIENCY (ICD-266.2) Assessment: Unchanged On the regimen of medicine(s) reflected in the chart    Problem # 4:  BIPOLAR DEPRESSION (ICD-296.7) Assessment: Unchanged On the regimen of medicine(s) reflected in the chart    Problem # 5:  PRURITUS (ICD-698.9) a spot on L upper back - ?dry skin Assessment: Deteriorated Triamcinolone 0.5% bid as needed   Problem # 6:  SINUSITIS, ACUTE (ICD-461.9) Assessment: New  Her updated medication list for this problem includes:    Tylenol Sinus Max St 30-500  Mg Tabs (Pseudoephedrine-apap) .Marland Kitchen... As needed    Nasacort Aq 55 Mcg/act Aers (Triamcinolone acetonide(nasal)) .Marland Kitchen... 1 spray in each nostril daily for rhinitis    Amoxicillin 500 Mg Caps (Amoxicillin) .Marland Kitchen... 2 caps by mouth bid  Problem # 7:  HIP PAIN (ICD-719.45) R Assessment: Unchanged She declined injection Her updated medication list for this problem includes:    Hydrocodone-acetaminophen 5-325 Mg Tabs (Hydrocodone-acetaminophen) .Marland Kitchen... 1-2 by mouth two times a day as needed pain    Naproxen 500 Mg Tabs (Naproxen) .Marland Kitchen... 1 by mouth two times a day pc for pain/arthritis  Orders: Physical Therapy Referral (PT)  Complete Medication List: 1)  Xanax 1 Mg Tabs (Alprazolam) .... Three times a day 2)  Seroquel 25 Mg Tabs (Quetiapine fumarate) .... Once daily 3)  Adderall 20 Mg Tabs (Amphetamine-dextroamphetamine) .... Three times a day 4)  Levothyroxine Sodium 50 Mcg Tabs (Levothyroxine sodium) .... Once daily 5)  Promethazine Hcl 25 Mg Tabs (Promethazine hcl) .... As needed 6)  Proair Hfa 108 (90 Base) Mcg/act Aers (Albuterol sulfate) .... 2 inh q4h as needed shortness of breath 7)  Prevacid 30 Mg Cpdr (Lansoprazole) .... One by mouth daily 8)  Lidoderm 5 % Ptch (Lidocaine) .... Use 1-2 two times a day prn 9)  Vitamin D [address] Unit Caps (Ergocalciferol) .Marland Kitchen.. 1 by mouth q 3-4 wks 10)  Tylenol Sinus Max St 30-500 Mg Tabs (Pseudoephedrine-apap) .... As needed 11)  Senokot S 8.6-50 Mg Tabs (Sennosides-docusate sodium) .Marland Kitchen.. 1 by mouth two times a day as needed constipation 12)  Pennsaid 1.5 % Soln (Diclofenac sodium) .... 3-5 gtt on a joint two times a day prn 13)  Nasacort Aq 55 Mcg/act Aers (Triamcinolone acetonide(nasal)) .Marland Kitchen.. 1 spray in each nostril daily for rhinitis 14)  Relpax 20 Mg Tabs (Eletriptan hydrobromide) .... As directed 15)  Hydrocodone-acetaminophen 5-325 Mg Tabs (Hydrocodone-acetaminophen) .Marland Kitchen.. 1-2 by mouth two times a day as needed pain 16)  Naproxen 500 Mg Tabs (Naproxen) .Marland Kitchen..  1 by mouth two times a day pc for pain/arthritis 17)  Loratadine 10 Mg Tabs (Loratadine) .Marland Kitchen.. 1 by mouth once daily as needed allergies 18)  Vitamin B-12 1000 Mcg Subl (Cyanocobalamin) .Marland Kitchen.. 1 by mouth qd 19)  Triamcinolone Acetonide 0.5 % Crea (Triamcinolone acetonide) .... Use two times a day as needed itching/rash 20)  Amoxicillin 500 Mg Caps (Amoxicillin) .... 2 caps by mouth bid 21)  Physical Therapy  .... Dx: fms, lbp, hip pains, neck pain please eval and treat. thank you!  Patient Instructions: 1)  Please schedule a follow-up appointment in 3 months. Prescriptions: LORATADINE 10 MG TABS (LORATADINE) 1 by mouth once daily as needed allergies  #30 x 11   Entered and Authorized by:   Tresa Garter MD   Signed by:   Tresa Garter MD on 12/01/2010   Method used:   Print then Give to Patient   RxID:   [id number] PHYSICAL THERAPY Dx: FMS, LBP, hip pains, neck pain Please eval and treat. Thank you!  #1 x 0   Entered and Authorized by:   Tresa Garter MD   Signed by:   Tresa Garter MD on 12/01/2010   Method used:   Print then Give to Patient   RxID:   [id number] AMOXICILLIN 500 MG CAPS (AMOXICILLIN) 2 caps by mouth bid  #56 x 0   Entered and Authorized by:   Tresa Garter MD   Signed by:   Tresa Garter MD on 12/01/2010   Method used:   Print then Give to Patient   RxID:   [id number] TRIAMCINOLONE ACETONIDE 0.5 % CREA (TRIAMCINOLONE ACETONIDE) use two times a day as needed itching/rash  #120 g x 3   Entered and Authorized by:   Tresa Garter MD   Signed by:   Tresa Garter MD on 12/01/2010   Method used:   Print then Give to Patient   RxID:   [id number] NAPROXEN 500 MG TABS (NAPROXEN) 1 by mouth two times a day pc for pain/arthritis  #60 x 3   Entered and Authorized by:   Tresa Garter MD   Signed by:   Tresa Garter MD on 12/01/2010   Method used:   Print then Give to Patient   RxID:    [id number] HYDROCODONE-ACETAMINOPHEN 5-325 MG TABS (HYDROCODONE-ACETAMINOPHEN) 1-2 by mouth two times a day as needed pain  #90 x 1   Entered  and Authorized by:   Tresa Garter MD   Signed by:   Tresa Garter MD on 12/01/2010   Method used:   Print then Give to Patient   RxID:   [id number]    Orders Added: 1)  Physical Therapy Referral [PT] 2)  Est. Patient Level IV [[address]782]

## 2010-12-25 ENCOUNTER — Telehealth: Payer: Self-pay | Admitting: Internal Medicine

## 2011-01-04 NOTE — Progress Notes (Signed)
Summary: refill  Phone Note Refill Request   Refills Requested: Medication #1:  NASACORT AQ 55 MCG/ACT AERS 1 spray in each nostril daily for rhinitis Initial call taken by: Burnard Leigh [facility]),  December 25, 2010 8:53 AM    Prescriptions: NASACORT AQ 55 MCG/ACT AERS (TRIAMCINOLONE ACETONIDE(NASAL)) 1 spray in each nostril daily for rhinitis  #1 x 6   Entered by:   Burnard Leigh CMA(AAMA)   Authorized by:   Tresa Garter MD   Signed by:   Burnard Leigh [facility]) on 12/25/2010   Method used:   Electronically to        [facility]* (retail)       [address]       [place], [place]  [address]       Ph: [phone]       Fax: [phone]   RxID:   [id number]

## 2011-01-30 ENCOUNTER — Telehealth: Payer: Self-pay | Admitting: *Deleted

## 2011-01-30 ENCOUNTER — Emergency Department (HOSPITAL_COMMUNITY)
Admission: EM | Admit: 2011-01-30 | Discharge: 2011-01-31 | Disposition: A | Discharge status: Home / Self Care | Payer: Medicare Other | Attending: Emergency Medicine | Admitting: Emergency Medicine

## 2011-01-30 ENCOUNTER — Emergency Department (HOSPITAL_COMMUNITY): Payer: Medicare Other

## 2011-01-30 DIAGNOSIS — F319 Bipolar disorder, unspecified: Secondary | ICD-10-CM | POA: Insufficient documentation

## 2011-01-30 DIAGNOSIS — E039 Hypothyroidism, unspecified: Secondary | ICD-10-CM | POA: Insufficient documentation

## 2011-01-30 DIAGNOSIS — K219 Gastro-esophageal reflux disease without esophagitis: Secondary | ICD-10-CM | POA: Insufficient documentation

## 2011-01-30 DIAGNOSIS — R1031 Right lower quadrant pain: Secondary | ICD-10-CM | POA: Insufficient documentation

## 2011-01-30 DIAGNOSIS — Z79899 Other long term (current) drug therapy: Secondary | ICD-10-CM | POA: Insufficient documentation

## 2011-01-30 DIAGNOSIS — F341 Dysthymic disorder: Secondary | ICD-10-CM | POA: Insufficient documentation

## 2011-01-30 DIAGNOSIS — E876 Hypokalemia: Secondary | ICD-10-CM | POA: Insufficient documentation

## 2011-01-30 DIAGNOSIS — M549 Dorsalgia, unspecified: Secondary | ICD-10-CM | POA: Insufficient documentation

## 2011-01-30 DIAGNOSIS — Z882 Allergy status to sulfonamides status: Secondary | ICD-10-CM | POA: Insufficient documentation

## 2011-01-30 DIAGNOSIS — N83209 Unspecified ovarian cyst, unspecified side: Secondary | ICD-10-CM | POA: Insufficient documentation

## 2011-01-30 DIAGNOSIS — E162 Hypoglycemia, unspecified: Secondary | ICD-10-CM | POA: Insufficient documentation

## 2011-01-30 DIAGNOSIS — G8929 Other chronic pain: Secondary | ICD-10-CM | POA: Insufficient documentation

## 2011-01-30 LAB — BASIC METABOLIC PANEL
BUN: 11 mg/dL (ref 6–23)
CO2: 28 mEq/L (ref 19–32)
Calcium: 8.8 mg/dL (ref 8.4–10.5)
Chloride: 109 mEq/L (ref 96–112)
Creatinine, Ser: 0.63 mg/dL (ref 0.4–1.2)
GFR calc Af Amer: >60 mL/min (ref >60)
GFR calc non Af Amer: >60 mL/min (ref >60)
Glucose, Bld: 69 mg/dL — ABNORMAL LOW (ref 70–99)
Potassium: 3.2 mEq/L — ABNORMAL LOW (ref 3.5–5.1)
Sodium: 139 mEq/L (ref 135–145)

## 2011-01-30 LAB — CBC
HCT: 40 % (ref 36.0–46.0)
Hemoglobin: 13.6 g/dL (ref 12.0–15.0)
MCH: 29.9 pg (ref 26.0–34.0)
MCHC: 34 g/dL (ref 30.0–36.0)
MCV: 87.9 fL (ref 78.0–100.0)
Platelets: 146 10*3/uL — ABNORMAL LOW (ref 150–400)
RBC: 4.55 MIL/uL (ref 3.87–5.11)
RDW: 13 % (ref 11.5–15.5)
WBC: 6.2 10*3/uL (ref 4.0–10.5)

## 2011-01-30 LAB — URINALYSIS, ROUTINE W REFLEX MICROSCOPIC
Bilirubin Urine: NEGATIVE
Glucose, UA: NEGATIVE mg/dL
Hgb urine dipstick: NEGATIVE
Ketones, ur: NEGATIVE mg/dL
Nitrite: NEGATIVE
Protein, ur: NEGATIVE mg/dL
Specific Gravity, Urine: 1.02 (ref 1.005–1.030)
Urobilinogen, UA: 0.2 mg/dL (ref 0.0–1.0)
pH: 5.5 (ref 5.0–8.0)

## 2011-01-30 LAB — DIFFERENTIAL
Basophils Absolute: 0 10*3/uL (ref 0.0–0.1)
Basophils Relative: 0 % (ref 0–1)
Eosinophils Absolute: 0.1 10*3/uL (ref 0.0–0.7)
Eosinophils Relative: 2 % (ref 0–5)
Lymphocytes Relative: 28 % (ref 12–46)
Lymphs Abs: 1.7 10*3/uL (ref 0.7–4.0)
Monocytes Absolute: 0.4 10*3/uL (ref 0.1–1.0)
Monocytes Relative: 6 % (ref 3–12)
Neutro Abs: 3.9 10*3/uL (ref 1.7–7.7)
Neutrophils Relative %: 63 % (ref 43–77)

## 2011-01-30 MED ORDER — IOHEXOL 300 MG/ML  SOLN
100.0000 mL | Freq: Once | INTRAMUSCULAR | Status: AC | PRN
  Administered 2011-01-30: 100 mL via INTRAVENOUS

## 2011-01-30 NOTE — Telephone Encounter (Signed)
Pt left vm c/o severe abd pain. Pain started this am - describes it as upper right quadrant, tender to touch and also has nausea. Advised pt to go to ER or UC now, she finally agreed and will f/u w/Plot as needed.

## 2011-01-31 NOTE — Telephone Encounter (Signed)
Agree  Thank you

## 2011-03-14 ENCOUNTER — Encounter: Payer: Self-pay | Admitting: Internal Medicine

## 2011-03-16 ENCOUNTER — Other Ambulatory Visit (INDEPENDENT_AMBULATORY_CARE_PROVIDER_SITE_OTHER): Payer: Medicare Other

## 2011-03-16 ENCOUNTER — Encounter: Payer: Self-pay | Admitting: Internal Medicine

## 2011-03-16 ENCOUNTER — Ambulatory Visit (INDEPENDENT_AMBULATORY_CARE_PROVIDER_SITE_OTHER): Payer: Medicare Other | Admitting: Internal Medicine

## 2011-03-16 DIAGNOSIS — E039 Hypothyroidism, unspecified: Secondary | ICD-10-CM

## 2011-03-16 DIAGNOSIS — E538 Deficiency of other specified B group vitamins: Secondary | ICD-10-CM

## 2011-03-16 DIAGNOSIS — M25559 Pain in unspecified hip: Secondary | ICD-10-CM

## 2011-03-16 DIAGNOSIS — B029 Zoster without complications: Secondary | ICD-10-CM

## 2011-03-16 DIAGNOSIS — R5383 Other fatigue: Secondary | ICD-10-CM

## 2011-03-16 DIAGNOSIS — R5381 Other malaise: Secondary | ICD-10-CM

## 2011-03-16 LAB — CBC WITH DIFFERENTIAL/PLATELET
Basophils Absolute: 0 10*3/uL (ref 0.0–0.1)
Eosinophils Absolute: 0.1 10*3/uL (ref 0.0–0.7)
Eosinophils Relative: 1.6 % (ref 0.0–5.0)
HCT: 42.9 % (ref 36.0–46.0)
Lymphocytes Relative: 32.8 % (ref 12.0–46.0)
Lymphs Abs: 2.3 10*3/uL (ref 0.7–4.0)
MCHC: 34.4 g/dL (ref 30.0–36.0)
Monocytes Relative: 5.9 % (ref 3.0–12.0)
Neutro Abs: 4.1 10*3/uL (ref 1.4–7.7)
Platelets: 162 10*3/uL (ref 150.0–400.0)
RDW: 13.2 % (ref 11.5–14.6)

## 2011-03-16 LAB — TSH: TSH: 3.55 u[IU]/mL (ref 0.35–5.50)

## 2011-03-16 LAB — COMPREHENSIVE METABOLIC PANEL
ALT: 19 U/L (ref 0–35)
AST: 19 U/L (ref 0–37)
CO2: 31 mEq/L (ref 19–32)
Calcium: 9.5 mg/dL (ref 8.4–10.5)
Chloride: 102 mEq/L (ref 96–112)
GFR: 81.11 mL/min (ref >60.00)
Sodium: 139 mEq/L (ref 135–145)
Total Protein: 7.1 g/dL (ref 6.0–8.3)

## 2011-03-16 LAB — SEDIMENTATION RATE: Sed Rate: 8 mm/hr (ref 0–22)

## 2011-03-16 MED ORDER — NAPROXEN 500 MG PO TABS: 500.0000 mg | ORAL_TABLET | Freq: Two times a day (BID) | ORAL | Status: DC

## 2011-03-16 MED ORDER — ACYCLOVIR 800 MG PO TABS: 800.0000 mg | ORAL_TABLET | Freq: Every day | ORAL | Status: AC

## 2011-03-16 NOTE — H&P (Signed)
[place]  Patient:    Melody [name], Melody [name]                      MRN: [id number] Adm. Date:  [id number] Disc. Date: [id number] Attending:  Tressie Stalker D                         History and Physical  CHIEF COMPLAINT: Neck pain and right arm pain.  HISTORY OF PRESENT ILLNESS: The patient is a 55 year old white female who began having neck problems approximately six weeks ago with right-sided neck pain racing down her right arm and shoulder. She was seen in the emergency department at [facility] and also in [place]. She was treated and released. She was worked up by her primary doctor with an MRI scan which demonstrated a large herniated disc. The patient failed medical management and therefore, weighed the risks, benefits and alternatives of surgery and decided to proceed with an anterior cervical dissection, infusion and plating.  The patient complains of right-sided neck pain radiating down the right arm with numbness and paresthesias in the 3rd and 4th digits. She has had weakness in her triceps.  PAST MEDICAL HISTORY: Positive for chronic ear infections, sinus problems, heart murmur, bronchitis/pneumonia, depression.  PAST SURGICAL HISTORY: Tubal ligation. She had a lumbar puncture approximately four years ago for workup of headache and it was negative.  MEDICATIONS PRIOR TO ADMISSION: 1.  Vicodin p.r.n. 2.  Xanax 0.5 mg p.o. t.i.d.  DRUG ALLERGIES: SULFA causes her to become "sick". CODEINE and OXYCODONE also causes her to become "sick". She could not be more specific.  FAMILY HISTORY: The patients died at age 71 secondary to stomach cancer. The patients father is age 16 and has emphysema/COPD and hypertension.  SOCIAL HISTORY: The patient is married with two grown children. She lives in [place]. She smokes one pack a day of cigarettes for approximately 20 years. I have advised her to quit. She denies ethanol or drug use.  REVIEW OF  SYSTEMS: Negative except as above.  PHYSICAL EXAMINATION:  GENERAL: A pleasant well-nourished, well-developed 55 year old white female complaining of neck and right arm pain.  HEIGHT: 5 feet 6 inches.  WEIGHT: 140 pounds.  HEENT: Normocephalic, atraumatic. Pupils equal, round and reactive to Pearce. Extraocular muscles intact. Sclerae white, conjunctivae pink, oropharynx benign. Uvula midline.  NECK: Supple. No cervical masses, meningismus, deformity, tracheal deviation, jugular venous distention, or carotid bruit. She has limited cervical range of motion. Spurlings test is positive on the right, negative on the left. _____ sign was not present.  THORAX: Symmetric.  LUNGS: Clear to auscultation.  HEART: Regular rate and rhythm.  ABDOMEN: Soft and nontender.  EXTREMITIES: No obvious deformities.  BACK: Benign.  NEUROLOGIC: The patient is alert and oriented x 3. Cranial nerves II-XII are grossly intact. Bilateral vision is grossly normal with contact lenses. Hearing grossly normal. Bilateral muscle strength is 5/5 in the bilateral deltoids, biceps, wrists, psoas, quadriceps, gastrocnemius longus and left triceps. The right tricep strength is diminished at 4-/5. Sensory exam demonstrates decreased sensation in her middle and right finger on the right, otherwise unremarkable. Deep tendon reflexes are 2/4 in the bilateral biceps, triceps, brachioradialis. 2+/4 bilateral quadriceps and gastrocnemius. She has bilateral flexor plantar reflexes. No ankle clonus. Cerebellar exam is intact with rapid alternating movements of the upper extremities bilaterally.  IMAGING STUDIES: The patient had a cervical MRI performed May 28, 2000 at [facility]  which demonstrates a slightly straightened cervical spine. She has some mild spondylosis at C5-6. At C6-7 she has a large herniated nucleus pulposus on the right. Compression of the right T7 nerve root.  ASSESSMENT &  PLAN: 1.  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 2.  Herniated nucleus pulposus. 3.  Spinal stenosis. 4.  Cervical radiculopathy. 5.  Cervicalgia.  I have discussed the situation with the patient and her husband and reviewed the MRI scan with them and pointed out the abnormalities. Clearly she is suffering from a left C7 radiculopathy. I discussed the various treatment options including continuing medical management, giving more time and surgery. I described the procedure of C6-7 anterior cervical diskectomy, fusion and plating. Discussed the risks of surgery extensively excessively. I also discussed the fact that she has some mild spondylosis of C5-6, but I do not think she is symptomatic of this and did not recommend any surgery on this at this time. The patient has weighed the risks, benefits and alternatives of surgery and wants to proceed with the C6-7 anterior cervical diskectomy, fusion and plating on June 13, 2000. DD:  06/13/00 TD:  06/14/00 Job: [id number]

## 2011-03-16 NOTE — Assessment & Plan Note (Signed)
On Rx 

## 2011-03-16 NOTE — Op Note (Signed)
NAMEJOANIE, DUPREY             ACCOUNT NO.:  [id number]   MEDICAL RECORD NO.:  [id number]          PATIENT TYPE:  OIB   LOCATION:  3017                         FACILITY:  [facility]   PHYSICIAN:  Cristi Loron, M.D.DATE OF BIRTH:  [birth year]   DATE OF PROCEDURE:  11/02/2004  DATE OF DISCHARGE:                                 OPERATIVE REPORT   PREOPERATIVE DIAGNOSES:  C5-6 herniated nucleus pulposus, spondylosis,  stenosis, cervical radiculopathy, cervicalgia.   POSTOPERATIVE DIAGNOSES:  C5-6 herniated nucleus pulposus, spondylosis,  stenosis, cervical radiculopathy, cervicalgia.   PROCEDURE:  C5-6 extensive anterior cervical diskectomy; interbody iliac  crest allograft arthrodesis; anterior cervical plating; Codman SLIM-LOC  titanium plate and screws; removal of old Codman plate at Z6-1.   SURGEON:  Cristi Loron, M.D.   ASSISTANT:  Hilda Lias, M.D.   ANESTHESIA:  General endotracheal.   ESTIMATED BLOOD LOSS:  50 cc.   SPECIMENS:  None.   DRAINS:  None.   COMPLICATIONS:  None.   BRIEF HISTORY:  The patient is a 55 year old white female on whom I  performed a C6-7 anterior cervical diskectomy and plating a couple of years  ago. She did well, but recently has developed severe neck and left arm pain.  She has failed medical management, was worked up with a cervical MRI which  demonstrated she had a herniated disk and spondylosis at C5-6 with  significant neuroforaminal stenosis.   I discussed the various treatment options with the patient and her husband,  including surgery. The patient weighed the risks, benefits and alternatives  of surgery and decided to proceed with a C5-6 anterior cervical diskectomy,  fusion and plating.   DESCRIPTION OF PROCEDURE:  The patient was brought to the operating room by  the anesthesia team. General endotracheal anesthesia was induced.  The  patient remained in the supine position.  A roll was placed underneath her  shoulders to place her neck in slight extension.  Her anterior cervical  region was then prepared with Betadine scrub and Betadine solution.  Sterile  drapes were applied.  I then injected the intravenous sites with Marcaine  with epinephrine solution.   I used a scalpel to make a transverse incision in the patient's left  anterior neck.  I used Metzenbaum scissors to divide the platysma muscle.  I  dissected medially to the sternocleidomastoid muscle, jugular vein, carotid  artery.  I bluntly dissected down through the anterior cervical spine,  carefully identified the esophagus and retracted it medially.  I then used  Kitner swabs to clear soft tissue from the anterior cervical spine, as well  as exposing the tissue over the old C6-7 anterior cervical plate.  I then  used a scalpel to remove the remainder of the scar tissue over the cams and  the screws.  I then locked the cams, removed the screws and then removed the  old plate at W9-6.   We then used a Cloward to detach the medial border of the longus colli  muscles bilaterally from C5-6 intervertebral disk space.  We inserted the  Caspar self-retaining retractor for exposure.  I  then used a scalpel to  incise the C5-6 intervertebral disk to perform a partial diskectomy, using  the pituitary forceps and Carlen curets. We then inserted distraction screws  from C5 and C6 distracted interspace.  We used the high speed drill to  decorticate the vertebral endplates at C5-6,  drill away the remainder of C5-  6 intervertebral disk, and drill away some posterior spondylosis at C5-6.   We thinned out the posterior longitudinal ligament with the drill and then  incised it with __________ knife and removed it with a Kerrison punch,  undercutting the vertebral endplates of C5-6, decompressing the thecal sac.  We then performed foraminotomies about the bilateral C4 nerve roots.  On the  right side, we found quite a bit of spondylosis.  On the  left side, there  was spondylosis, as well as a soft disk herniation.  We decompressed both  nerve roots using Kerrison punch, completing the decompression.   We now turned our attention to the arthrodesis. We obtained iliac crest  tricortical allograft bone graft and fashioned it to these approximate  dimensions:  8 mm in height, 1 mm in depth.  We inserted the bone graft onto  the distracted C5-6 interspace and then removed the distraction screws.  There was a good, snug fit of the bone graft.   We then turned our attention to the anterior spinal instrumentation. We used  the high speed drill to remove some ventral spondylosis from the C5-6  interspace, so that the plate would lay down flat. We selected the  appropriate length Codman SLIM-LOC anterior cervical plate.  We laid it  along the anterior aspect of the vertebral bodies of C4-C5.  We used the old  holes at C6 and placed two 12 mm self-tapping screws in C6. We drilled 2 new  holes at C5, placed two 12 mm self-tapping screws at that level.  The right  screw stripped, so we replaced it with a 12 mm rescue screw.  We obtained an  intraoperative radiograph that demonstrated good position of the plate and  screws by graft.  We then secured the screws to the plate by locking each  cam. We then used the bipolar electrocautery to obtain strict hemostasis. We  copiously irrigated the wound out with Bacitracin solution, removed the  solution. We then removed the Caspar self-retaining retractor.  We inspected  the esophagus for any damage, there was none apparent.   We then reapproximated the patient's platysma muscle with interrupted 3-0  Vicryl suture, subcutaneous tissue with interrupted 3-0 Vicryl suture and  skin with Steri-Strips and Benzoin. The wound was then coated with  Bacitracin ointment and sterile dressing was applied. The drapes were removed.  The patient was subsequently extubated by the anesthesia team and  transported  to the postanesthesia care unit in stable condition. All sponge,  instrument and needle counts were correct at the end of this case.       JDJ/MEDQ  D:  11/02/2004  T:  11/02/2004  Job:  [id number]

## 2011-03-16 NOTE — Progress Notes (Signed)
  Subjective:    Patient ID: Melody [name], female    DOB: [birth year], 55 y.o.   MRN: [id number]  HPI  C/o severe pains all over - R 3-4 fingers hurt C/o R 2nd toe bent inwards  The patient is here to follow up on chronic depression, anxiety, headaches and chronic moderate fibromyalgia symptoms not controlled with medicines, diet and exercise.   Review of Systems  Constitutional: Positive for fever and fatigue.  HENT: Negative for neck stiffness, sinus pressure and tinnitus.   Eyes: Negative for pain.  Respiratory: Negative for shortness of breath.   Cardiovascular: Negative for leg swelling.  Gastrointestinal: Positive for abdominal pain. Negative for blood in stool and anal bleeding.  Genitourinary: Negative for frequency.  Musculoskeletal: Positive for myalgias, arthralgias and gait problem.  Neurological: Positive for weakness. Negative for tremors.  Psychiatric/Behavioral: Negative for suicidal ideas. The patient is nervous/anxious.        Objective:   Physical Exam  Constitutional: She appears well-developed and well-nourished. No distress.       Tired  HENT:  Head: Normocephalic.  Right Ear: External ear normal.  Left Ear: External ear normal.  Nose: Nose normal.  Mouth/Throat: Oropharynx is clear and moist.  Eyes: Conjunctivae are normal. Pupils are equal, round, and reactive to Toppin. Right eye exhibits no discharge. Left eye exhibits no discharge.  Neck: Normal range of motion. Neck supple. No JVD present. No tracheal deviation present. No thyromegaly present.  Cardiovascular: Normal rate, regular rhythm and normal heart sounds.   Pulmonary/Chest: No stridor. No respiratory distress. She has no wheezes.  Abdominal: Soft. Bowel sounds are normal. She exhibits no distension and no mass. There is no tenderness. There is no rebound and no guarding.  Musculoskeletal: She exhibits tenderness (R upper and lower leg is tender). She exhibits no edema.  Lymphadenopathy:   She has no cervical adenopathy.  Neurological: She displays normal reflexes. No cranial nerve deficit. She exhibits normal muscle tone. Coordination normal.  Skin: Rash (R thigh anter 1 cm patch of erythema with a crust) noted. No erythema.  Psychiatric: She has a normal mood and affect. Her behavior is normal. Judgment and thought content normal.          Assessment & Plan:  HIP PAIN R thigh  HYPOTHYROIDISM On Rx  VITAMIN B12 DEFICIENCY On  Rx   Probable H zoster R leg Acyclovir  FMS  PT made it worse

## 2011-03-16 NOTE — H&P (Signed)
[facility]  Patient:    Melody [name], Melody [name] Visit Number: [id number] MRN: [id number]          Service Type: EMS Location: [facility] Attending Physician:  Hanley Seamen Dictated by:   Reymundo Poll Dub Mikes, M.D. Admit Date:  12/23/2001 Discharge Date: 12/23/2001                     Psychiatric Admission Assessment  CHIEF COMPLAINT AND PRESENTING ILLNESS:  This was the first admission to [facility] for this 55 year old female who initially came to the emergency room.  She was apparently complaining of weakness, some difficulty with congestion, sinus troubles, saying "I just feel sick."  There was a history in that she woke up extremely weak, nearly fainted, admitted that she was under a lot of stress at home, multiple deaths, issues in raising a 70-year-old granddaughter, having an unemployed alcoholic son who lives with them, so there was more evidence of depression and anxiety, and she was wanting to leave the emergency room without being evaluated.  She was sad, tearful, therefore she was involuntarily committed.  She admitted that she had 5 deaths of close friends or family within the past 4 years, a lot of family dysfunction, family stressors, 74 year old son acting out.  She was apparently attending outpatient treatment.  She was on Xanax and Ritalin.  PAST PSYCHIATRIC HISTORY:  She has been in [place] once.  She has been treated by her primary care physician with Xanax and Ritalin.  Apparently, they have tried several antidepressants before but they were not successful.  FAMILY HISTORY:  History of mood disorder, substance use.  ALCOHOL AND DRUG HISTORY:  There was no history of use or abuse of any substances.  MEDICAL HISTORY:  As already stated, initially went to emergency room feeling sick, weak, some congestion.  MENTAL STATUS EXAMINATION:  Revealed a well-nourished, well-developed, alert, cooperative female.  Mood anxious,  depressed.  Affect anxious, depressed. Thoughts dealt with the events that were upsetting her, that were stressful, willingness to deal with them, but was wanted to be released from the hospital and she felt that it was not helpful, that she was going to get worse.  ADMITTING  DIAGNOSES: Axis I:    Major depression, recurrent. Axis II:   No diagnosis. Axis III:  No diagnosis. Axis IV:   Moderate. Axis V:    Global assessment of function upon admission 35, highest            global assessment of function in past year 65-70.  PLAN:  We will have a family session as soon as possible to clarify issues and if safety is a threat, and if everyone feels okay about her being discharged she will be discharged home. Dictated by:   Reymundo Poll Dub Mikes, M.D. Attending Physician:  Hanley Seamen DD:  01/28/02 TD:  01/29/02 Job: [id number]

## 2011-03-16 NOTE — Op Note (Signed)
[place]. [facility]  Patient:    Melody [name], Melody [name]                      MRN: [id number] Proc. Date: 06/13/00 Adm. Date:  [id number] Disc. 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  OPERATION PERFORMED:  C6-7 anterior cervical diskectomy, interbody iliac crest allograft, arthrodesis, anterior cervical plating, C6-7 Codman.  SURGEON:  Cristi Loron, M.D.  ASSISTANT:  Stefani Dama, M.D.  ANESTHESIA:  General endotracheal.  ESTIMATED BLOOD LOSS:  50 cc.  SPECIMENS:  None.  DRAINS:  None.  COMPLICATIONS:  None.  INDICATIONS FOR PROCEDURE:  The patient is a 55 year old white female who has suffered from neck and right arm pain and failed medical management.  She was worked up as an outpatient with a cervical MRI that demonstrated a large herniated nucleus pulposus at C6-7.  She therefore weighed the risks, benefits and alternatives to surgery and decided to proceed with an anterior cervical diskectomy and fusion and plating.  DESCRIPTION OF PROCEDURE:  The patient was brought to the operating room by the anesthesia team.  General endotracheal anesthesia was induced.  The patient remained in supine position.  A roll was placed under her shoulder to place her neck in slight extension.  Her anterior cervical region was then prepared with Betadine scrub and Betadine solution.  Sterile drapes were applied.  I then injected the area to be incised with Marcaine with epinephrine solution.  I used a scalpel to make a transverse incision in the patients anterior neck.  I dissected down to the platysma muscle with Metzenbaum scissors and divided it along the direction of the skin  incision, then dissected medial to the sternocleidomastoid muscle, jugular vein and carotid artery and then bluntly dissected down to the anterior cervical spine, identifying the esophagus and carefully retracting it medially.  I cleared the soft tissue from the anterior cervical spine and then inserted a bent spinal needle into the exposed interspace. I then obtained an intraoperative radiograph that demonstrated that the needle was in C6-7.  I used electrocautery to detach the medial border of the longus colli muscle bilaterally  from the C6-7 intervertebral disk space.  I inserted the Caspar self-retaining retractor for exposure.  I then used the scalpel to incise the C6-7 intervertebral disk.  There was quite a bit of spondylosis anteriorly which I drillled away with the Midas Rex high speed drill.   I then perfomred a partial diskectomy with the pituitary forceps.  I inserted distraction screws in the C6 and C7 and distracted the C6-7 interspace and then used the Midas Rex high speed drill to decorticate the vertebral end plates at K4-4 and drill away the remainder of the intervertebral disk.  I thinned out the posterior longitudinal ligament with the drill, then incised it with the arachnoid knife. There was a large free fragment disk herniation out in the right neural foramen compressing the right C7 nerve root.  I removed it in several large fragments with the pituitary forceps.  I then removed the rest of the posterior longitudinal ligament undercutting the vertebral end plates at W1-0.  I performed a generous  foraminotomy about the bilateral C7 nerve roots.  At this point the thecal sac and bilateral C7 nerve roots were well decompressed.  I now turned my attention to the arthrodesis.  I obtained an iliac crest tricortical allograft bone graft and fashioned it to these proximal dimensions, 7 mm in height, 1 cm in depth.  I inserted it into the distracted interspace and then I  removed distraction pins.  There was a good snug fit of the bone graft.  I completed the arthrodesis.  I now turned my attention to the anterior spinal instrumentation.  I obtained the appropriate length Codman anterior cervical plate and laid it along the anterior aspect of C5 and C6.  I drilled two holes at C6 anterior aspects of C6-7.  I drilled two holes at C6, two holes at C7, tapped these holes, inserted 12 mm screws two at each level.  I then obtained intraoperative radiograph that demonstrated good position of the plates ____________ interbody graft.  I then secured the screws to the plate with a Cam tightener.  I then copiously irrigated the wound out with bacitracin solution, removed the solution and removed the Caspar self-retaining retractor, achieved stringent hemostasis with bipolar electrocautery and Gelfoam.  I then inspected the esophagus for any damage.  There was none.  I then reapproximated the patients platysma muscle with interrupted 3-0 Vicryl suture in the subcutaneous tissues and interrupted 3-0 Vicryl and the skin with Steri-Strips and benzoin.  The wound was then coated with bacitracin.  A sterile dressing was applied.  The drapes were removed and the patient was subsequently extubated by the anesthesia team and transported to the post anesthesia care unit in stable condition.  All sponge, needle and instrument counts were correct at the end of this case.DD:  06/13/00 TD:  06/14/00 Job: [id number]

## 2011-03-16 NOTE — Assessment & Plan Note (Signed)
R thigh

## 2011-03-16 NOTE — Discharge Summary (Signed)
[facility]  Patient:    Melody [name], Melody [name] Visit Number: [id number] MRN: [id number]          Service Type: EMS Location: [facility] Attending Physician:  Hanley Seamen Dictated by:   Reymundo Poll Dub Mikes, M.D. Admit Date:  12/23/2001 Discharge Date: 12/23/2001                             Discharge Summary  CHIEF COMPLAINT AND PRESENTING ILLNESS:  This was the first admission to [facility] for this 55 year old female, initially admitted after she went to the emergency room for physical complaints and it was found out that she was feeling more upset, more depressed, under a lot of stress. Referred to death of family members, issues of raising a granddaughter, 39 year old son alcoholic, unemployed, who was acting out.  She denied any suicidal ideas, did not endorse any homicidal ideas.  PAST PSYCHIATRIC HISTORY:  Was positive for having been admitted to [facility] some years ago.  History was positive for having seen Dr. Evelene Croon for outpatient treatment, having tried several antidepressants and eventually settling on Ritalin and Xanax.  MEDICAL HISTORY:  Noncontributory.  PHYSICAL EXAMINATION:  Performed at the emergency room and failed to show any acute findings.  MENTAL STATUS EXAMINATION:  Reveals a well-nourished, well-developed, alert, cooperative female.  Mood anxious, depressed, affect anxious depressed. Thoughts dealt with the events, the stressors, her attempts to cope.  No suicidal ideas, no homicidal ideas, cognition well preserved.  ADMITTING  DIAGNOSES: Axis I:    Major depression, recurrent. Axis II:   No diagnosis. Axis III:  No diagnosis. Axis IV:   Moderate. Axis V:    Global assessment of function upon admission 35, highest            global assessment of function in past year 60-65.  COURSE IN THE HOSPITAL:  She was admitted.  We had a family session with the husband.  She kept on the same medications.  It was felt that  she was safe to go home.  She was willing to follow up with Dr. Evelene Croon and stay on the same medications and there was no evidence of dangerousness to self or others. Discharge was considered and granted.  DISCHARGE  DIAGNOSES: Axis I:    Major depression, recurrent. Axis II:   No diagnosis. Axis III:  No diagnosis. Axis IV:   Moderate. Axis V:    Global assessment of function upon discharge 55-60.  DISCHARGE MEDICATIONS: 1. Ritalin 20 mg twice a day. 2. Xanax 1 mg 3 times a day.  DISPOSITION:  To see Dr. Evelene Croon and she is going to see Abel Presto for individual counseling. Dictated by:   Reymundo Poll Dub Mikes, M.D. Attending Physician:  Hanley Seamen DD:  01/28/02 TD:  01/29/02 Job: [id number]

## 2011-03-16 NOTE — Assessment & Plan Note (Signed)
[facility]                                 ON-CALL NOTE   NAME:Melody [name], Melody [name]                    MRN:          [id number]  DATE:04/12/2008                            DOB:          08/22/1956    PRIMARY CARE PHYSICIAN:  Georgina Quint. Plotnikov, MD   SUBJECTIVE:  The patient calls stating she is in a lot of pain from back  pain that is chronic along with a recent fall over the weekend. She  states that Dr. Posey Rea would usually call her in some Darvocet and  she is interested in this now.   ASSESSMENT/PLAN:  Recommended that if she is severe pain that she is to  be seen at an urgent care for a possible temporary prescription.  She  was informed that the policy is not to call in pain medication over the  phone after hours. She was also told to call first thing in the morning  when Dr. Posey Rea is in the office to discuss with him either  appointment or a prescription for pain medication.     Kerby Nora, MD  Electronically Signed    AB/MedQ  DD: 04/12/2008  DT: 04/12/2008  Job #: [id number]   cc:   Georgina Quint. Plotnikov, MD

## 2011-03-17 LAB — HIV ANTIBODY (ROUTINE TESTING W REFLEX): HIV: NONREACTIVE

## 2011-03-18 ENCOUNTER — Telehealth: Payer: Self-pay | Admitting: Internal Medicine

## 2011-03-18 NOTE — Telephone Encounter (Signed)
Stacey, please, inform patient that all labs are normal Thx 

## 2011-03-20 NOTE — Telephone Encounter (Signed)
Pt informed

## 2011-03-22 ENCOUNTER — Telehealth: Payer: Self-pay | Admitting: *Deleted

## 2011-03-22 NOTE — Telephone Encounter (Signed)
Use Triamc. Cream on rash Cont Acyclovir OV iv worse Thx

## 2011-03-22 NOTE — Telephone Encounter (Signed)
Pt says RX given for shingles was supposed to be 5 x's a day - She is only able to tolerate taking med 3 to 4 times daily. She has increase in rash on her arm and cough. Please advise.

## 2011-03-23 NOTE — Telephone Encounter (Signed)
Left detailed vm for pt.

## 2011-03-28 ENCOUNTER — Telehealth: Payer: Self-pay | Admitting: *Deleted

## 2011-03-28 NOTE — Telephone Encounter (Signed)
Patient is req rx for tylox - says this is only med that helps w/pt. She is c/o leg heaviness an pain.

## 2011-03-29 MED ORDER — OXYCODONE-ACETAMINOPHEN 5-500 MG PO CAPS: 1.0000 | ORAL_CAPSULE | Freq: Three times a day (TID) | ORAL | Status: DC | PRN

## 2011-03-29 NOTE — Telephone Encounter (Signed)
Ok thx.

## 2011-03-29 NOTE — Telephone Encounter (Signed)
Returned call to patient advised rx ready to pick up. She states that she leaves in [place] and needs this mailed.

## 2011-04-10 ENCOUNTER — Telehealth: Payer: Self-pay | Admitting: *Deleted

## 2011-04-10 NOTE — Telephone Encounter (Signed)
OV w/any MD. It doesn't sound like shingles Thx

## 2011-04-10 NOTE — Telephone Encounter (Signed)
I offered apt's this week, pt unable to come in due to other apt and she is very upset. Would you suggest derm referral?

## 2011-04-10 NOTE — Telephone Encounter (Signed)
Pt states that she finished her medication for Shingles about two weeks ago and that she is still having Shingles outbreaks. Pt would like to know what you would like her to do- Ok to schedule OV.?

## 2011-04-10 NOTE — Telephone Encounter (Signed)
Sure. UC or Derm. Thx

## 2011-04-10 NOTE — Telephone Encounter (Signed)
Pt does not want to see anyone else. I offered again, an apt for tomorrow. She says she is unable and wants Friday apt. Advised her to call at 8 am Friday and speak w/scheduler for apt.

## 2011-04-10 NOTE — Telephone Encounter (Signed)
Pt has blisters that have reoccurred - they are on her neck, buttocks, legs and side. I offered apt tomorrow but she is unable due to 2 other apts. She would like to know if MD thinks she needs more or different antiviral?

## 2011-04-10 NOTE — Telephone Encounter (Signed)
Spoke w/pt - she now has re-occurrence of what she thinks is shingles.

## 2011-04-13 ENCOUNTER — Ambulatory Visit (INDEPENDENT_AMBULATORY_CARE_PROVIDER_SITE_OTHER): Payer: Medicare Other | Admitting: Internal Medicine

## 2011-04-13 ENCOUNTER — Encounter: Payer: Self-pay | Admitting: Internal Medicine

## 2011-04-13 VITALS — BP 122/78 | HR 80 | Temp 99.0°F | Resp 16 | Wt 154.0 lb

## 2011-04-13 DIAGNOSIS — L259 Unspecified contact dermatitis, unspecified cause: Secondary | ICD-10-CM

## 2011-04-13 MED ORDER — OXYCODONE-ACETAMINOPHEN 5-500 MG PO CAPS: 1.0000 | ORAL_CAPSULE | Freq: Three times a day (TID) | ORAL | Status: DC | PRN

## 2011-04-13 MED ORDER — HYDROXYZINE PAMOATE 50 MG PO CAPS: 50.0000 mg | ORAL_CAPSULE | Freq: Three times a day (TID) | ORAL | Status: AC | PRN

## 2011-04-13 MED ORDER — PREDNISONE 10 MG PO TABS: ORAL_TABLET | ORAL | Status: AC

## 2011-04-13 NOTE — Assessment & Plan Note (Signed)
Prednisone 10 mg: take 4 tabs a day x 3 days; then 3 tabs a day x 4 days; then 2 tabs a day x 4 days, then 1 tab a day x 6 days, then stop. Take pc.  

## 2011-04-13 NOTE — Progress Notes (Signed)
  Subjective:    Patient ID: Melody [name], female    DOB: May 22, 1956, 55 y.o.   MRN: [id number]  HPI C/o itchy rash following exposure to vines in the yard   Review of Systems  Constitutional: Negative for fever.  Skin: Positive for rash.       Objective:   Physical Exam  Constitutional: She appears well-developed and well-nourished. No distress.  HENT:  Head: Normocephalic.  Right Ear: External ear normal.  Left Ear: External ear normal.  Nose: Nose normal.  Mouth/Throat: Oropharynx is clear and moist.  Eyes: Conjunctivae are normal. Pupils are equal, round, and reactive to Vanyo. Right eye exhibits no discharge. Left eye exhibits no discharge.  Neck: Normal range of motion. Neck supple. No JVD present. No tracheal deviation present. No thyromegaly present.  Cardiovascular: Normal rate, regular rhythm and normal heart sounds.   Pulmonary/Chest: No stridor. No respiratory distress. She has no wheezes.  Abdominal: Soft. Bowel sounds are normal. She exhibits no distension and no mass. There is no tenderness. There is no rebound and no guarding.  Musculoskeletal: She exhibits no edema and no tenderness.  Lymphadenopathy:    She has no cervical adenopathy.  Neurological: She displays normal reflexes. No cranial nerve deficit. She exhibits normal muscle tone. Coordination normal.  Skin: Rash noted. No erythema.       Papular rash in streaks, excoriations and single lesions on extr-s and trunk  Psychiatric: She has a normal mood and affect. Her behavior is normal. Judgment and thought content normal.          Assessment & Plan:

## 2011-04-13 NOTE — Patient Instructions (Signed)
Use wet wipes to clean

## 2011-05-17 ENCOUNTER — Telehealth: Payer: Self-pay | Admitting: *Deleted

## 2011-05-17 MED ORDER — AMOXICILLIN 500 MG PO CAPS: 1000.0000 mg | ORAL_CAPSULE | Freq: Two times a day (BID) | ORAL | Status: AC

## 2011-05-17 NOTE — Telephone Encounter (Signed)
Pt left vm stating she quit smoking 10-12 days ago and is now c/o chest congestion/tightness. She is requesting Rx for amoxicillin. Please advise

## 2011-05-17 NOTE — Telephone Encounter (Signed)
I'm glad she stopped smoking! OK Amox - done Thx

## 2011-05-18 NOTE — Telephone Encounter (Signed)
FYI:  Pt informed. She c/o cough, CP, SOB and chest tightness since quitting smoking. I advised her to go to ER or UC OR call 911 if symptoms persist/worsen. I also informed her to start amoxicillin and see if this helps.

## 2011-06-04 NOTE — Telephone Encounter (Signed)
Agree. Thx 

## 2011-06-22 ENCOUNTER — Ambulatory Visit (INDEPENDENT_AMBULATORY_CARE_PROVIDER_SITE_OTHER): Payer: Medicare Other | Admitting: Internal Medicine

## 2011-06-22 ENCOUNTER — Encounter: Payer: Self-pay | Admitting: Internal Medicine

## 2011-06-22 DIAGNOSIS — F411 Generalized anxiety disorder: Secondary | ICD-10-CM

## 2011-06-22 DIAGNOSIS — IMO0001 Reserved for inherently not codable concepts without codable children: Secondary | ICD-10-CM

## 2011-06-22 DIAGNOSIS — E538 Deficiency of other specified B group vitamins: Secondary | ICD-10-CM

## 2011-06-22 DIAGNOSIS — M545 Low back pain, unspecified: Secondary | ICD-10-CM

## 2011-06-22 DIAGNOSIS — E039 Hypothyroidism, unspecified: Secondary | ICD-10-CM

## 2011-06-22 DIAGNOSIS — F319 Bipolar disorder, unspecified: Secondary | ICD-10-CM

## 2011-06-22 MED ORDER — LORATADINE 10 MG PO TABS: 10.0000 mg | ORAL_TABLET | Freq: Every day | ORAL | Status: DC

## 2011-06-22 MED ORDER — NAPROXEN 500 MG PO TABS: 500.0000 mg | ORAL_TABLET | Freq: Two times a day (BID) | ORAL | Status: DC

## 2011-06-22 MED ORDER — OXYCODONE-ACETAMINOPHEN 5-500 MG PO CAPS: 1.0000 | ORAL_CAPSULE | Freq: Three times a day (TID) | ORAL | Status: DC | PRN

## 2011-06-22 MED ORDER — PROMETHAZINE HCL 25 MG PO TABS: 25.0000 mg | ORAL_TABLET | ORAL | Status: DC | PRN

## 2011-06-22 MED ORDER — LANSOPRAZOLE 30 MG PO CPDR: 30.0000 mg | DELAYED_RELEASE_CAPSULE | Freq: Every day | ORAL | Status: DC

## 2011-06-22 NOTE — Patient Instructions (Signed)
Irrigate your sinuses Dexilant one a day for acid Advair one inhalation twice a day

## 2011-06-22 NOTE — Progress Notes (Signed)
  Subjective:    Patient ID: Melody [name], female    DOB: [birth year], 55 y.o.   MRN: [id number]  HPI   The patient is here to follow up on chronic depression, anxiety, headaches and chronic moderate fibromyalgia symptoms controlled with medicines, diet and exercise. She quit smoking on 7/8 - c/o wt gain and uneasy breathing. C/o GERD Wt Readings from Last 3 Encounters:  06/22/11 161 lb (73.029 kg)  04/13/11 154 lb (69.854 kg)  03/16/11 150 lb (68.04 kg)     Review of Systems  Constitutional: Positive for unexpected weight change (wt gain). Negative for chills, activity change, appetite change and fatigue.  HENT: Negative for congestion, mouth sores and sinus pressure.   Eyes: Negative for visual disturbance.  Respiratory: Positive for shortness of breath. Negative for cough, chest tightness and wheezing.   Gastrointestinal: Negative for nausea and abdominal pain.       Gerd  Genitourinary: Negative for frequency, difficulty urinating and vaginal pain.  Musculoskeletal: Positive for back pain and arthralgias. Negative for gait problem.  Skin: Negative for pallor and rash.  Neurological: Negative for dizziness, tremors, weakness, numbness and headaches.  Psychiatric/Behavioral: Negative for confusion and sleep disturbance.       Objective:   Physical Exam  Constitutional: She appears well-developed and well-nourished. No distress.  HENT:  Head: Normocephalic.  Right Ear: External ear normal.  Left Ear: External ear normal.  Nose: Nose normal.  Mouth/Throat: Oropharynx is clear and moist.  Eyes: Conjunctivae are normal. Pupils are equal, round, and reactive to Deblanc. Right eye exhibits no discharge. Left eye exhibits no discharge.  Neck: Normal range of motion. Neck supple. No JVD present. No tracheal deviation present. No thyromegaly present.  Cardiovascular: Normal rate, regular rhythm and normal heart sounds.   Pulmonary/Chest: No stridor. No respiratory distress. She has  no wheezes.  Abdominal: Soft. Bowel sounds are normal. She exhibits no distension and no mass. There is no tenderness. There is no rebound and no guarding.  Musculoskeletal: She exhibits tenderness (LS is tender). She exhibits no edema.  Lymphadenopathy:    She has no cervical adenopathy.  Neurological: She displays normal reflexes. No cranial nerve deficit. She exhibits normal muscle tone. Coordination normal.  Skin: No rash noted. No erythema.  Psychiatric: She has a normal mood and affect. Her behavior is normal. Judgment and thought content normal.          Assessment & Plan:

## 2011-06-23 NOTE — Assessment & Plan Note (Signed)
On Rx 

## 2011-06-23 NOTE — Assessment & Plan Note (Signed)
On Rx Stretch LS spine

## 2011-06-23 NOTE — Assessment & Plan Note (Signed)
Rx per Dr Evelene Croon

## 2011-07-06 ENCOUNTER — Other Ambulatory Visit: Payer: Self-pay | Admitting: *Deleted

## 2011-07-06 MED ORDER — LEVOTHYROXINE SODIUM 50 MCG PO TABS: 50.0000 ug | ORAL_TABLET | Freq: Every day | ORAL | Status: DC

## 2011-09-14 ENCOUNTER — Encounter: Payer: Self-pay | Admitting: Internal Medicine

## 2011-09-14 ENCOUNTER — Ambulatory Visit (INDEPENDENT_AMBULATORY_CARE_PROVIDER_SITE_OTHER): Payer: Medicare Other | Admitting: Internal Medicine

## 2011-09-14 DIAGNOSIS — F319 Bipolar disorder, unspecified: Secondary | ICD-10-CM

## 2011-09-14 DIAGNOSIS — F411 Generalized anxiety disorder: Secondary | ICD-10-CM

## 2011-09-14 DIAGNOSIS — E538 Deficiency of other specified B group vitamins: Secondary | ICD-10-CM

## 2011-09-14 DIAGNOSIS — E039 Hypothyroidism, unspecified: Secondary | ICD-10-CM

## 2011-09-14 DIAGNOSIS — M545 Low back pain, unspecified: Secondary | ICD-10-CM

## 2011-09-14 DIAGNOSIS — IMO0001 Reserved for inherently not codable concepts without codable children: Secondary | ICD-10-CM

## 2011-09-14 MED ORDER — TRIAMCINOLONE ACETONIDE(NASAL) 55 MCG/ACT NA INHA: 1.0000 | Freq: Every day | NASAL | Status: DC

## 2011-09-14 MED ORDER — ALBUTEROL SULFATE HFA 108 (90 BASE) MCG/ACT IN AERS: 2.0000 | INHALATION_SPRAY | RESPIRATORY_TRACT | Status: DC | PRN

## 2011-09-14 MED ORDER — LANSOPRAZOLE 30 MG PO CPDR: 30.0000 mg | DELAYED_RELEASE_CAPSULE | Freq: Every day | ORAL | Status: DC

## 2011-09-14 MED ORDER — NAPROXEN 500 MG PO TABS: 500.0000 mg | ORAL_TABLET | Freq: Two times a day (BID) | ORAL | Status: DC

## 2011-09-14 MED ORDER — LEVOTHYROXINE SODIUM 50 MCG PO TABS: 50.0000 ug | ORAL_TABLET | Freq: Every day | ORAL | Status: DC

## 2011-09-14 NOTE — Progress Notes (Signed)
  Subjective:    Patient ID: Melody [name], female    DOB: Jul 30, 1956, 55 y.o.   MRN: [id number]  HPI   The patient is here to follow up on chronic depression, anxiety, headaches and chronic moderate fibromyalgia symptoms controlled with medicines, diet and exercise. C/o stress with separation   Review of Systems  Constitutional: Positive for fatigue. Negative for chills, activity change, appetite change and unexpected weight change.  HENT: Negative for congestion, mouth sores and sinus pressure.   Eyes: Negative for visual disturbance.  Respiratory: Negative for cough and chest tightness.   Gastrointestinal: Negative for nausea and abdominal pain.  Genitourinary: Negative for frequency, difficulty urinating and vaginal pain.  Musculoskeletal: Positive for myalgias, back pain and arthralgias. Negative for gait problem.  Skin: Negative for pallor and rash.  Neurological: Negative for dizziness, tremors, weakness, numbness and headaches.  Psychiatric/Behavioral: Positive for sleep disturbance. Negative for suicidal ideas and confusion. The patient is nervous/anxious.        Objective:   Physical Exam  Constitutional: She appears well-developed and well-nourished. No distress.  HENT:  Head: Normocephalic.  Right Ear: External ear normal.  Left Ear: External ear normal.  Nose: Nose normal.  Mouth/Throat: Oropharynx is clear and moist.  Eyes: Conjunctivae are normal. Pupils are equal, round, and reactive to Harshman. Right eye exhibits no discharge. Left eye exhibits no discharge.  Neck: Normal range of motion. Neck supple. No JVD present. No tracheal deviation present. No thyromegaly present.  Cardiovascular: Normal rate, regular rhythm and normal heart sounds.   Pulmonary/Chest: No stridor. No respiratory distress. She has no wheezes.  Abdominal: Soft. Bowel sounds are normal. She exhibits no distension and no mass. There is no tenderness. There is no rebound and no guarding.    Musculoskeletal: She exhibits tenderness (many spots on back and neck are tender). She exhibits no edema.  Lymphadenopathy:    She has no cervical adenopathy.  Neurological: She displays normal reflexes. No cranial nerve deficit. She exhibits normal muscle tone. Coordination normal.  Skin: No rash noted. No erythema.  Psychiatric: She has a normal mood and affect. Her behavior is normal. Judgment and thought content normal.       sad          Assessment & Plan:

## 2011-09-16 ENCOUNTER — Encounter: Payer: Self-pay | Admitting: Internal Medicine

## 2011-09-16 NOTE — Assessment & Plan Note (Signed)
Continue with current prescription therapy as reflected on the Med list.  

## 2011-09-16 NOTE — Assessment & Plan Note (Signed)
She stopped Tylox See Meds

## 2011-09-16 NOTE — Assessment & Plan Note (Signed)
She stopped Tylox due to side effects See Meds

## 2011-10-29 ENCOUNTER — Telehealth: Payer: Self-pay

## 2011-10-29 MED ORDER — ALBUTEROL SULFATE HFA 108 (90 BASE) MCG/ACT IN AERS: 2.0000 | INHALATION_SPRAY | RESPIRATORY_TRACT | Status: DC | PRN

## 2011-10-29 NOTE — Telephone Encounter (Signed)
refill 

## 2011-12-14 ENCOUNTER — Ambulatory Visit (INDEPENDENT_AMBULATORY_CARE_PROVIDER_SITE_OTHER): Payer: Medicare Other | Admitting: Internal Medicine

## 2011-12-14 ENCOUNTER — Other Ambulatory Visit (INDEPENDENT_AMBULATORY_CARE_PROVIDER_SITE_OTHER): Payer: Medicare Other

## 2011-12-14 ENCOUNTER — Encounter: Payer: Self-pay | Admitting: Internal Medicine

## 2011-12-14 DIAGNOSIS — E538 Deficiency of other specified B group vitamins: Secondary | ICD-10-CM

## 2011-12-14 DIAGNOSIS — F319 Bipolar disorder, unspecified: Secondary | ICD-10-CM

## 2011-12-14 DIAGNOSIS — D485 Neoplasm of uncertain behavior of skin: Secondary | ICD-10-CM

## 2011-12-14 DIAGNOSIS — F411 Generalized anxiety disorder: Secondary | ICD-10-CM

## 2011-12-14 DIAGNOSIS — IMO0001 Reserved for inherently not codable concepts without codable children: Secondary | ICD-10-CM

## 2011-12-14 DIAGNOSIS — M545 Low back pain, unspecified: Secondary | ICD-10-CM

## 2011-12-14 LAB — BASIC METABOLIC PANEL
Calcium: 9.3 mg/dL (ref 8.4–10.5)
GFR: 91.65 mL/min (ref >60.00)
Sodium: 140 mEq/L (ref 135–145)

## 2011-12-14 MED ORDER — POLYETHYLENE GLYCOL 3350 17 GM/SCOOP PO POWD: 17.0000 g | Freq: Every day | ORAL | Status: DC

## 2011-12-14 MED ORDER — TRIAMCINOLONE ACETONIDE 0.5 % EX CREA: TOPICAL_CREAM | Freq: Two times a day (BID) | CUTANEOUS | Status: DC

## 2011-12-14 MED ORDER — PREGABALIN 75 MG PO CAPS: 75.0000 mg | ORAL_CAPSULE | Freq: Two times a day (BID) | ORAL | Status: DC

## 2011-12-14 NOTE — Assessment & Plan Note (Signed)
Continue with current prescription therapy as reflected on the Med list.  

## 2011-12-14 NOTE — Assessment & Plan Note (Signed)
Triamc bid

## 2011-12-14 NOTE — Progress Notes (Signed)
Patient ID: Melody [name], female   DOB: 1956/09/16, 56 y.o.   MRN: [id number]  Subjective:    Patient ID: Melody [name], female    DOB: 1956-08-12, 56 y.o.   MRN: [id number]  HPI   The patient is here to follow up on chronic depression, anxiety, headaches and chronic moderate fibromyalgia symptoms controlled with medicines, diet and exercise. C/o stress with separation, C/o tremor and fatigue   Review of Systems  Constitutional: Positive for fatigue. Negative for chills, activity change, appetite change and unexpected weight change.  HENT: Negative for congestion, mouth sores and sinus pressure.   Eyes: Negative for visual disturbance.  Respiratory: Negative for cough and chest tightness.   Gastrointestinal: Negative for nausea and abdominal pain.  Genitourinary: Negative for frequency, difficulty urinating and vaginal pain.  Musculoskeletal: Positive for myalgias, back pain and arthralgias. Negative for gait problem.  Skin: Negative for pallor and rash.  Neurological: Negative for dizziness, tremors, weakness, numbness and headaches.  Psychiatric/Behavioral: Positive for sleep disturbance. Negative for suicidal ideas and confusion. The patient is nervous/anxious.        Objective:   Physical Exam  Constitutional: She appears well-developed and well-nourished. No distress.  HENT:  Head: Normocephalic.  Right Ear: External ear normal.  Left Ear: External ear normal.  Nose: Nose normal.  Mouth/Throat: Oropharynx is clear and moist.  Eyes: Conjunctivae are normal. Pupils are equal, round, and reactive to Gaertner. Right eye exhibits no discharge. Left eye exhibits no discharge.  Neck: Normal range of motion. Neck supple. No JVD present. No tracheal deviation present. No thyromegaly present.  Cardiovascular: Normal rate, regular rhythm and normal heart sounds.   Pulmonary/Chest: No stridor. No respiratory distress. She has no wheezes.  Abdominal: Soft. Bowel sounds are normal. She  exhibits no distension and no mass. There is no tenderness. There is no rebound and no guarding.  Musculoskeletal: She exhibits tenderness (many spots on back and neck are tender). She exhibits no edema.  Lymphadenopathy:    She has no cervical adenopathy.  Neurological: She displays normal reflexes. No cranial nerve deficit. She exhibits normal muscle tone. Coordination normal.  Skin: No rash noted. No erythema.  Psychiatric: She has a normal mood and affect. Her behavior is normal. Judgment and thought content normal.       sad          Assessment & Plan:

## 2011-12-17 ENCOUNTER — Telehealth: Payer: Self-pay

## 2011-12-17 MED ORDER — AMOXICILLIN 500 MG PO CAPS: 1000.0000 mg | ORAL_CAPSULE | Freq: Two times a day (BID) | ORAL | Status: AC

## 2011-12-17 NOTE — Telephone Encounter (Signed)
Ok Amoxicillin - done Thx 

## 2011-12-17 NOTE — Telephone Encounter (Signed)
Pt called c/o sinus pressure, ST, mild cough, and ear pain. Pt is requesting Rx for Penicillin to treat sinus infection, please advise.

## 2011-12-19 NOTE — Telephone Encounter (Signed)
Called above number- spoke to a female who stated it was wrong number.

## 2012-01-01 ENCOUNTER — Telehealth: Payer: Self-pay | Admitting: *Deleted

## 2012-01-01 NOTE — Telephone Encounter (Signed)
Patient states that she has lost the pills of the Amoxicillin script that she was given per phone request [02.18.13 Sig:2 capsules BID #40-should have expired 02.28.13] and is requesting another Rx for Amoxicillin; stating head congestion w/headache & cough. Informed patient that AVP is out of office until Monday and offered OV w/another MD in office; patient refused to come in to office. Advised patient that she should be A&E, if not at our office, then possibly ER and/or UC, patient again refused. Advised Pt that if she will not come into office to be seen by MD to stay hydrated w/lots of fluids [no dairy and/or sugar] and use pharmacist recommendation for OTC sinus & cough medication. Reiterated to reconsider appointment with another physician. Pt understood, but only requested that message be given to AVP for his return on Monday, 03.11.13 to request Rx for ABX only-no OV.

## 2012-01-02 MED ORDER — AMOXICILLIN 500 MG PO CAPS: 1000.0000 mg | ORAL_CAPSULE | Freq: Two times a day (BID) | ORAL | Status: AC

## 2012-01-02 NOTE — Telephone Encounter (Signed)
Rx Done. Patient informed. 

## 2012-01-02 NOTE — Telephone Encounter (Signed)
Ok to ref amoxicillin Thx

## 2012-01-10 ENCOUNTER — Other Ambulatory Visit: Payer: Self-pay | Admitting: *Deleted

## 2012-01-10 MED ORDER — LORATADINE 10 MG PO TABS: 10.0000 mg | ORAL_TABLET | Freq: Every day | ORAL | Status: DC

## 2012-03-17 ENCOUNTER — Telehealth: Payer: Self-pay | Admitting: *Deleted

## 2012-03-17 NOTE — Telephone Encounter (Signed)
OK to fill this prescription with additional refills x2 Thank you!  

## 2012-03-17 NOTE — Telephone Encounter (Signed)
Rf req for Nasonex 50 mcg/AC. Instill 1 spray each nostril qd for rhinitis. This med is not on active med list. Ok to RF?

## 2012-03-18 ENCOUNTER — Ambulatory Visit: Payer: Medicare Other | Admitting: Internal Medicine

## 2012-03-18 ENCOUNTER — Telehealth: Payer: Self-pay

## 2012-03-18 MED ORDER — MOMETASONE FUROATE 50 MCG/ACT NA SUSP: 1.0000 | Freq: Every day | NASAL | Status: DC

## 2012-03-18 NOTE — Telephone Encounter (Signed)
Pt called c/o sinus infection- nasal congestion, post nasal drip, HA, productive cough (green). Pt has appt scheduled today but says she cannot make it because she just woke up. Pt is requesting Rx for ABX, please advise.

## 2012-03-18 NOTE — Telephone Encounter (Signed)
Done

## 2012-03-19 ENCOUNTER — Ambulatory Visit: Payer: Medicare Other | Admitting: Internal Medicine

## 2012-03-19 MED ORDER — AMOXICILLIN 500 MG PO CAPS: 1000.0000 mg | ORAL_CAPSULE | Freq: Two times a day (BID) | ORAL | Status: AC

## 2012-03-19 NOTE — Telephone Encounter (Signed)
OK Amoxicillin x 10 d Thx

## 2012-03-19 NOTE — Telephone Encounter (Signed)
Pt informed

## 2012-06-04 ENCOUNTER — Ambulatory Visit (INDEPENDENT_AMBULATORY_CARE_PROVIDER_SITE_OTHER): Payer: Medicare Other | Admitting: Internal Medicine

## 2012-06-04 ENCOUNTER — Encounter: Payer: Self-pay | Admitting: Internal Medicine

## 2012-06-04 VITALS — BP 122/80 | HR 68 | Temp 97.9°F | Resp 16 | Wt 136.0 lb

## 2012-06-04 DIAGNOSIS — E039 Hypothyroidism, unspecified: Secondary | ICD-10-CM

## 2012-06-04 DIAGNOSIS — IMO0001 Reserved for inherently not codable concepts without codable children: Secondary | ICD-10-CM

## 2012-06-04 DIAGNOSIS — R21 Rash and other nonspecific skin eruption: Secondary | ICD-10-CM

## 2012-06-04 DIAGNOSIS — E538 Deficiency of other specified B group vitamins: Secondary | ICD-10-CM

## 2012-06-04 MED ORDER — TRIAMCINOLONE ACETONIDE 0.5 % EX CREA: TOPICAL_CREAM | Freq: Three times a day (TID) | CUTANEOUS | Status: DC

## 2012-06-04 MED ORDER — TRIAMCINOLONE ACETONIDE 0.5 % EX CREA: TOPICAL_CREAM | Freq: Two times a day (BID) | CUTANEOUS | Status: DC

## 2012-06-04 MED ORDER — AMOXICILLIN 500 MG PO CAPS: 1000.0000 mg | ORAL_CAPSULE | Freq: Two times a day (BID) | ORAL | Status: AC

## 2012-06-04 MED ORDER — PREGABALIN 75 MG PO CAPS: 75.0000 mg | ORAL_CAPSULE | Freq: Two times a day (BID) | ORAL | Status: DC

## 2012-06-04 MED ORDER — METHYLPREDNISOLONE ACETATE 80 MG/ML IJ SUSP
120.0000 mg | Freq: Once | INTRAMUSCULAR | Status: AC
  Administered 2012-06-04: 120 mg via INTRAMUSCULAR

## 2012-06-04 MED ORDER — LORATADINE 10 MG PO TABS: 10.0000 mg | ORAL_TABLET | Freq: Every day | ORAL | Status: DC

## 2012-06-04 MED ORDER — LEVOTHYROXINE SODIUM 50 MCG PO TABS: 50.0000 ug | ORAL_TABLET | Freq: Every day | ORAL | Status: DC

## 2012-06-04 MED ORDER — ALBUTEROL SULFATE HFA 108 (90 BASE) MCG/ACT IN AERS: 2.0000 | INHALATION_SPRAY | RESPIRATORY_TRACT | Status: DC | PRN

## 2012-06-04 MED ORDER — MOMETASONE FUROATE 50 MCG/ACT NA SUSP: 1.0000 | Freq: Every day | NASAL | Status: DC

## 2012-06-04 MED ORDER — LANSOPRAZOLE 30 MG PO CPDR: 30.0000 mg | DELAYED_RELEASE_CAPSULE | Freq: Every day | ORAL | Status: DC

## 2012-06-04 NOTE — Progress Notes (Signed)
Patient ID: TRYSTIN HARGROVE, female   DOB: 1956-01-22, 56 y.o.   MRN: [id number] Patient ID: ELEXIS POLLAK, female   DOB: Mar 01, 1956, 56 y.o.   MRN: [id number]  Subjective:    Patient ID: Melody [name], female    DOB: [birth year], 56 y.o.   MRN: [id number]  Headache  Associated symptoms include back pain. Pertinent negatives include no abdominal pain, coughing, dizziness, nausea, numbness, sinus pressure or weakness.   C/o sinusitis sx's and itchy rash x 2 d  The patient is here to follow up on chronic depression, anxiety, headaches and chronic moderate fibromyalgia symptoms partially controlled with medicines  C/o fatigue - worse  Wt Readings from Last 3 Encounters:  06/04/12 136 lb (61.689 kg)  12/14/11 147 lb (66.679 kg)  09/14/11 154 lb (69.854 kg)    BP Readings from Last 3 Encounters:  06/04/12 122/80  12/14/11 130/90  09/14/11 110/70      Review of Systems  Constitutional: Positive for fatigue. Negative for chills, activity change, appetite change and unexpected weight change.  HENT: Negative for congestion, mouth sores and sinus pressure.   Eyes: Negative for visual disturbance.  Respiratory: Negative for cough and chest tightness.   Gastrointestinal: Negative for nausea and abdominal pain.  Genitourinary: Negative for frequency, difficulty urinating and vaginal pain.  Musculoskeletal: Positive for myalgias, back pain and arthralgias. Negative for gait problem.  Skin: Negative for pallor and rash.  Neurological: Positive for headaches. Negative for dizziness, tremors, weakness and numbness.  Psychiatric/Behavioral: Positive for disturbed wake/sleep cycle. Negative for suicidal ideas and confusion. The patient is nervous/anxious.        Objective:   Physical Exam  Constitutional: She appears well-developed and well-nourished. No distress.  HENT:  Head: Normocephalic.  Right Ear: External ear normal.  Left Ear: External ear normal.  Nose: Nose normal.  Mouth/Throat:  Oropharynx is clear and moist.  Eyes: Conjunctivae are normal. Pupils are equal, round, and reactive to Vasil. Right eye exhibits no discharge. Left eye exhibits no discharge.  Neck: Normal range of motion. Neck supple. No JVD present. No tracheal deviation present. No thyromegaly present.  Cardiovascular: Normal rate, regular rhythm and normal heart sounds.   Pulmonary/Chest: No stridor. No respiratory distress. She has no wheezes.  Abdominal: Soft. Bowel sounds are normal. She exhibits no distension and no mass. There is no tenderness. There is no rebound and no guarding.  Musculoskeletal: She exhibits tenderness (many spots on back and neck are tender). She exhibits no edema.  Lymphadenopathy:    She has no cervical adenopathy.  Neurological: She displays normal reflexes. No cranial nerve deficit. She exhibits normal muscle tone. Coordination normal.  Skin: No rash noted. No erythema.  Psychiatric: She has a normal mood and affect. Her behavior is normal. Judgment and thought content normal.       sad   Lab Results  Component Value Date   WBC 6.9 03/16/2011   HGB 14.7 03/16/2011   HCT 42.9 03/16/2011   PLT 162.0 03/16/2011   GLUCOSE 117* 12/14/2011   CHOL 174 05/29/2010   TRIG 38.0 05/29/2010   HDL 54.20 05/29/2010   LDLCALC 112* 05/29/2010   ALT 19 03/16/2011   AST 19 03/16/2011   NA 140 12/14/2011   K 4.4 12/14/2011   CL 104 12/14/2011   CREATININE 0.7 12/14/2011   BUN 19 12/14/2011   CO2 29 12/14/2011   TSH 3.55 03/16/2011          Assessment & Plan:

## 2012-06-08 DIAGNOSIS — R21 Rash and other nonspecific skin eruption: Secondary | ICD-10-CM | POA: Insufficient documentation

## 2012-06-08 NOTE — Assessment & Plan Note (Signed)
Continue with current prescription therapy as reflected on the Med list.  

## 2012-06-08 NOTE — Assessment & Plan Note (Signed)
Given a cream rx

## 2012-07-22 ENCOUNTER — Other Ambulatory Visit: Payer: Self-pay | Admitting: Internal Medicine

## 2012-07-23 ENCOUNTER — Other Ambulatory Visit: Payer: Self-pay | Admitting: General Practice

## 2012-07-23 MED ORDER — NAPROXEN 500 MG PO TABS: 500.0000 mg | ORAL_TABLET | Freq: Two times a day (BID) | ORAL | Status: DC

## 2012-08-05 ENCOUNTER — Ambulatory Visit (INDEPENDENT_AMBULATORY_CARE_PROVIDER_SITE_OTHER): Payer: Medicare Other | Admitting: Internal Medicine

## 2012-08-05 ENCOUNTER — Encounter: Payer: Self-pay | Admitting: Internal Medicine

## 2012-08-05 VITALS — BP 116/84 | HR 80 | Temp 97.7°F | Resp 16 | Wt 132.0 lb

## 2012-08-05 DIAGNOSIS — F319 Bipolar disorder, unspecified: Secondary | ICD-10-CM

## 2012-08-05 DIAGNOSIS — E538 Deficiency of other specified B group vitamins: Secondary | ICD-10-CM

## 2012-08-05 DIAGNOSIS — IMO0001 Reserved for inherently not codable concepts without codable children: Secondary | ICD-10-CM

## 2012-08-05 DIAGNOSIS — E039 Hypothyroidism, unspecified: Secondary | ICD-10-CM

## 2012-08-05 DIAGNOSIS — Z23 Encounter for immunization: Secondary | ICD-10-CM

## 2012-08-05 DIAGNOSIS — M545 Low back pain: Secondary | ICD-10-CM

## 2012-08-05 MED ORDER — NAPROXEN 500 MG PO TABS: 500.0000 mg | ORAL_TABLET | Freq: Two times a day (BID) | ORAL | Status: DC

## 2012-08-05 MED ORDER — MOMETASONE FUROATE 50 MCG/ACT NA SUSP: 1.0000 | Freq: Every day | NASAL | Status: DC

## 2012-08-05 NOTE — Assessment & Plan Note (Signed)
Continue with current prescription therapy as reflected on the Med list.  

## 2012-08-05 NOTE — Progress Notes (Signed)
   Subjective:    Patient ID: Melody [name], female    DOB: January 09, 1956, 56 y.o.   MRN: [id number]  HPI C/o sinusitis sx's and itchy rash x 2 d  The patient is here to follow up on chronic depression, anxiety, headaches and chronic moderate fibromyalgia symptoms partially controlled with medicines  C/o fatigue - worse  Wt Readings from Last 3 Encounters:  08/05/12 132 lb (59.875 kg)  06/04/12 136 lb (61.689 kg)  12/14/11 147 lb (66.679 kg)    BP Readings from Last 3 Encounters:  08/05/12 116/84  06/04/12 122/80  12/14/11 130/90      Review of Systems  Constitutional: Positive for fatigue. Negative for chills, activity change, appetite change and unexpected weight change.  HENT: Negative for congestion and mouth sores.   Eyes: Negative for visual disturbance.  Respiratory: Negative for chest tightness.   Genitourinary: Negative for frequency, difficulty urinating and vaginal pain.  Musculoskeletal: Positive for myalgias and arthralgias. Negative for gait problem.  Skin: Negative for pallor and rash.  Neurological: Negative for tremors.  Psychiatric/Behavioral: Positive for disturbed wake/sleep cycle. Negative for suicidal ideas and confusion. The patient is nervous/anxious.        Objective:   Physical Exam  Constitutional: She appears well-developed and well-nourished. No distress.  HENT:  Head: Normocephalic.  Right Ear: External ear normal.  Left Ear: External ear normal.  Nose: Nose normal.  Mouth/Throat: Oropharynx is clear and moist.  Eyes: Conjunctivae normal are normal. Pupils are equal, round, and reactive to [place]. Right eye exhibits no discharge. Left eye exhibits no discharge.  Neck: Normal range of motion. Neck supple. No JVD present. No tracheal deviation present. No thyromegaly present.  Cardiovascular: Normal rate, regular rhythm and normal heart sounds.   Pulmonary/Chest: No stridor. No respiratory distress. She has no wheezes.  Abdominal: Soft. Bowel  sounds are normal. She exhibits no distension and no mass. There is no tenderness. There is no rebound and no guarding.  Musculoskeletal: She exhibits tenderness (many spots on back and neck are tender). She exhibits no edema.  Lymphadenopathy:    She has no cervical adenopathy.  Neurological: She displays normal reflexes. No cranial nerve deficit. She exhibits normal muscle tone. Coordination normal.  Skin: No rash noted. No erythema.  Psychiatric: She has a normal mood and affect. Her behavior is normal. Judgment and thought content normal.       sad   Lab Results  Component Value Date   WBC 6.9 03/16/2011   HGB 14.7 03/16/2011   HCT 42.9 03/16/2011   PLT 162.0 03/16/2011   GLUCOSE 117* 12/14/2011   CHOL 174 05/29/2010   TRIG 38.0 05/29/2010   HDL 54.20 05/29/2010   LDLCALC 112* 05/29/2010   ALT 19 03/16/2011   AST 19 03/16/2011   NA 140 12/14/2011   K 4.4 12/14/2011   CL 104 12/14/2011   CREATININE 0.7 12/14/2011   BUN 19 12/14/2011   CO2 29 12/14/2011   TSH 3.55 03/16/2011          Assessment & Plan:

## 2012-09-01 ENCOUNTER — Telehealth: Payer: Self-pay | Admitting: Internal Medicine

## 2012-09-01 NOTE — Telephone Encounter (Signed)
Ok Tussionex 100 ml Thx

## 2012-09-01 NOTE — Telephone Encounter (Signed)
Patient calling,asking that MD call in antibiotics and a cough medication (requesting Tussinex) for her.  States that she was out in the wind on Sunday and has nasal congestion, sore throat and a cough.  She went to church and then to the flea market in the afternoon and then back to church.  No fever.   Coughed most of the night. Advised that she will need to schedule an OV.  States that "I know my body and I always call in and he calls me in medication."    Triaged per Cough-Adult.  Needs to be seen in 24 hours.  She refuses an appt.  She would like a call back about the medication.  Uses [facility].

## 2012-09-02 MED ORDER — HYDROCOD POLST-CHLORPHEN POLST 10-8 MG/5ML PO LQCR: 5.0000 mL | Freq: Two times a day (BID) | ORAL | Status: DC | PRN

## 2012-09-02 MED ORDER — AMOXICILLIN 500 MG PO CAPS: 1000.0000 mg | ORAL_CAPSULE | Freq: Two times a day (BID) | ORAL | Status: DC

## 2012-09-02 NOTE — Telephone Encounter (Signed)
Ok amoxicillin Thx 

## 2012-09-02 NOTE — Telephone Encounter (Signed)
Pt has codeine sulfa allergy, please advise

## 2012-09-02 NOTE — Telephone Encounter (Signed)
Done.... Pt also requesting Rx for amoxicillin. Ok?

## 2012-09-03 NOTE — Telephone Encounter (Signed)
Pharmacy notified.

## 2012-11-11 ENCOUNTER — Ambulatory Visit: Payer: Medicare Other | Admitting: Internal Medicine

## 2012-12-19 ENCOUNTER — Encounter: Payer: Self-pay | Admitting: Internal Medicine

## 2012-12-19 ENCOUNTER — Other Ambulatory Visit (INDEPENDENT_AMBULATORY_CARE_PROVIDER_SITE_OTHER): Payer: 59

## 2012-12-19 ENCOUNTER — Telehealth: Payer: Self-pay | Admitting: Internal Medicine

## 2012-12-19 ENCOUNTER — Ambulatory Visit (INDEPENDENT_AMBULATORY_CARE_PROVIDER_SITE_OTHER): Payer: 59 | Admitting: Internal Medicine

## 2012-12-19 ENCOUNTER — Ambulatory Visit (INDEPENDENT_AMBULATORY_CARE_PROVIDER_SITE_OTHER)
Admission: RE | Admit: 2012-12-19 | Discharge: 2012-12-19 | Disposition: A | Discharge status: Home / Self Care | Payer: 59 | Source: Ambulatory Visit | Attending: Internal Medicine | Admitting: Internal Medicine

## 2012-12-19 VITALS — BP 120/88 | HR 84 | Temp 97.9°F | Resp 16 | Wt 132.0 lb

## 2012-12-19 DIAGNOSIS — M545 Low back pain: Secondary | ICD-10-CM

## 2012-12-19 DIAGNOSIS — IMO0001 Reserved for inherently not codable concepts without codable children: Secondary | ICD-10-CM

## 2012-12-19 DIAGNOSIS — F319 Bipolar disorder, unspecified: Secondary | ICD-10-CM

## 2012-12-19 DIAGNOSIS — E538 Deficiency of other specified B group vitamins: Secondary | ICD-10-CM

## 2012-12-19 DIAGNOSIS — E039 Hypothyroidism, unspecified: Secondary | ICD-10-CM

## 2012-12-19 LAB — URINALYSIS
Ketones, ur: NEGATIVE
Leukocytes, UA: NEGATIVE
Nitrite: NEGATIVE
Specific Gravity, Urine: 1.01 (ref 1.000–1.030)
Urobilinogen, UA: 0.2 (ref 0.0–1.0)
pH: 6 (ref 5.0–8.0)

## 2012-12-19 LAB — CK: Total CK: 91 U/L (ref 7–177)

## 2012-12-19 LAB — BASIC METABOLIC PANEL
BUN: 17 mg/dL (ref 6–23)
Calcium: 9.2 mg/dL (ref 8.4–10.5)
GFR: 94.43 mL/min (ref >60.00)
Glucose, Bld: 74 mg/dL (ref 70–99)
Potassium: 3.6 mEq/L (ref 3.5–5.1)
Sodium: 139 mEq/L (ref 135–145)

## 2012-12-19 LAB — HEPATIC FUNCTION PANEL
Albumin: 4 g/dL (ref 3.5–5.2)
Alkaline Phosphatase: 54 U/L (ref 39–117)
Total Protein: 6.8 g/dL (ref 6.0–8.3)

## 2012-12-19 LAB — VITAMIN B12: Vitamin B-12: 348 pg/mL (ref 211–911)

## 2012-12-19 MED ORDER — CETIRIZINE HCL 10 MG PO TABS: 10.0000 mg | ORAL_TABLET | Freq: Every day | ORAL | Status: DC

## 2012-12-19 MED ORDER — CYANOCOBALAMIN 1000 MCG/ML IJ SOLN: 1000.0000 ug | Freq: Once | INTRAMUSCULAR | Status: DC

## 2012-12-19 NOTE — Progress Notes (Signed)
   Subjective:     HPI C/o LBP irrad to R LE - worse. She had to chop wood an carry it to the stove = achy  The patient is here to follow up on chronic depression, anxiety, headaches and chronic moderate fibromyalgia symptoms partially controlled with medicines  C/o fatigue - worse  Wt Readings from Last 3 Encounters:  12/19/12 132 lb (59.875 kg)  08/05/12 132 lb (59.875 kg)  06/04/12 136 lb (61.689 kg)    BP Readings from Last 3 Encounters:  12/19/12 120/88  08/05/12 116/84  06/04/12 122/80      Review of Systems  Constitutional: Positive for fatigue. Negative for chills, activity change, appetite change and unexpected weight change.  HENT: Negative for congestion and mouth sores.   Eyes: Negative for visual disturbance.  Respiratory: Negative for chest tightness.   Genitourinary: Negative for frequency, difficulty urinating and vaginal pain.  Musculoskeletal: Positive for myalgias and arthralgias. Negative for gait problem.  Skin: Negative for pallor and rash.  Neurological: Negative for tremors.  Psychiatric/Behavioral: Positive for sleep disturbance. Negative for suicidal ideas and confusion. The patient is nervous/anxious.        Objective:   Physical Exam  Constitutional: She appears well-developed and well-nourished. No distress.  HENT:  Head: Normocephalic.  Right Ear: External ear normal.  Left Ear: External ear normal.  Nose: Nose normal.  Mouth/Throat: Oropharynx is clear and moist.  Eyes: Conjunctivae are normal. Pupils are equal, round, and reactive to [place]. Right eye exhibits no discharge. Left eye exhibits no discharge.  Neck: Normal range of motion. Neck supple. No JVD present. No tracheal deviation present. No thyromegaly present.  Cardiovascular: Normal rate, regular rhythm and normal heart sounds.   Pulmonary/Chest: No stridor. No respiratory distress. She has no wheezes.  Abdominal: Soft. Bowel sounds are normal. She exhibits no distension and no  mass. There is no tenderness. There is no rebound and no guarding.  Musculoskeletal: She exhibits tenderness (many spots on back and neck are tender). She exhibits no edema.  Lymphadenopathy:    She has no cervical adenopathy.  Neurological: She displays normal reflexes. No cranial nerve deficit. She exhibits normal muscle tone. Coordination normal.  Skin: No rash noted. No erythema.  Psychiatric: She has a normal mood and affect. Her behavior is normal. Judgment and thought content normal.  sad   Lab Results  Component Value Date   WBC 6.9 03/16/2011   HGB 14.7 03/16/2011   HCT 42.9 03/16/2011   PLT 162.0 03/16/2011   GLUCOSE 117* 12/14/2011   CHOL 174 05/29/2010   TRIG 38.0 05/29/2010   HDL 54.20 05/29/2010   LDLCALC 112* 05/29/2010   ALT 19 03/16/2011   AST 19 03/16/2011   NA 140 12/14/2011   K 4.4 12/14/2011   CL 104 12/14/2011   CREATININE 0.7 12/14/2011   BUN 19 12/14/2011   CO2 29 12/14/2011   TSH 3.55 03/16/2011          Assessment & Plan:

## 2012-12-19 NOTE — Assessment & Plan Note (Signed)
Worse - x ray

## 2012-12-19 NOTE — Telephone Encounter (Signed)
Melody [name], please, inform patient that all labs are normal; back x ray showed OA - no fx Thx

## 2012-12-19 NOTE — Assessment & Plan Note (Signed)
Continue with current prescription therapy as reflected on the Med list.  

## 2012-12-22 NOTE — Telephone Encounter (Signed)
Pt informed- should she continue Lyrica? Is there anything else she can take for her pain? Please advise.

## 2012-12-22 NOTE — Telephone Encounter (Signed)
Yes, cont Lyrica Can add Tylenol Thx

## 2012-12-23 NOTE — Telephone Encounter (Signed)
Left detailed mess informing pt of below.  

## 2013-01-16 ENCOUNTER — Ambulatory Visit: Payer: Medicare Other | Admitting: Internal Medicine

## 2013-02-20 ENCOUNTER — Telehealth: Payer: Self-pay | Admitting: Internal Medicine

## 2013-02-23 NOTE — Telephone Encounter (Signed)
Triage Record Num: [id number] Operator: Melody [name] Patient Name: Melody [name] Call Date & Time: 02/20/2013 5:49:22PM Patient Phone: [phone] PCP: Melody [name] Patient Gender: Female PCP Fax : [phone] Patient DOB: 1956-04-24 Practice Name: [name] Patient Information: Caller Name: Melody [name] Phone: [phone] Patient: Melody [name] Gender: Female DOB: 10/12/1956 Age: 56 Years PCP: Melody [name] (Adults only) Office Follow Up: Does the office need to follow up with this patient?: No Instructions For The Office: N/A RN Note: Patient advised of office policy with no antibiotics without be evaluated. Patient expressed understanding. Offered appt for Saturday in clinic and denied. She states Dr. Posey [name] will call her medciation in. Home treatment and care advise provided Symptoms Reason For Call & Symptoms: Patient is requesting Antibiotics for a sinus infection. Onset of symptoms- yesterday, 02/19/13. She complains of headache ,watery eyes, nasal congestion. LOV 12/19/12 Reviewed Health History In EMR: Yes Reviewed Medications In EMR: Yes Reviewed Allergies In EMR: Yes Reviewed Surgeries / Procedures: Yes Date of Onset of Symptoms: 02/19/2013 Guideline(s) Used: Sinus Pain and Congestion Disposition Per Guideline: Home Care Reason For Disposition Reached: Sinus congestion as part of a cold, present < 10 days Advice Given: Reassurance: Sinus congestion is a normal part of a cold. Usually home treatment with nasal washes can prevent an actual bacterial sinus infection. For a Stuffy Nose - Use Nasal Washes: Introduction: Saline (salt water) nasal irrigation (nasal wash) is an effective and simple home remedy for treating stuffy nose and sinus congestion. The nose can be irrigated by pouring, spraying, or squirting salt water into the nose and then letting it run back out.

## 2013-03-18 ENCOUNTER — Encounter: Payer: Self-pay | Admitting: Internal Medicine

## 2013-03-18 ENCOUNTER — Other Ambulatory Visit (INDEPENDENT_AMBULATORY_CARE_PROVIDER_SITE_OTHER): Payer: 59

## 2013-03-18 ENCOUNTER — Ambulatory Visit (INDEPENDENT_AMBULATORY_CARE_PROVIDER_SITE_OTHER): Payer: 59 | Admitting: Internal Medicine

## 2013-03-18 VITALS — BP 130/86 | HR 76 | Temp 97.0°F | Resp 16 | Wt 134.0 lb

## 2013-03-18 DIAGNOSIS — E559 Vitamin D deficiency, unspecified: Secondary | ICD-10-CM

## 2013-03-18 DIAGNOSIS — M545 Low back pain: Secondary | ICD-10-CM

## 2013-03-18 DIAGNOSIS — IMO0001 Reserved for inherently not codable concepts without codable children: Secondary | ICD-10-CM

## 2013-03-18 DIAGNOSIS — E538 Deficiency of other specified B group vitamins: Secondary | ICD-10-CM

## 2013-03-18 DIAGNOSIS — R82998 Other abnormal findings in urine: Secondary | ICD-10-CM

## 2013-03-18 LAB — URINALYSIS
Hgb urine dipstick: NEGATIVE
Leukocytes, UA: NEGATIVE
Nitrite: NEGATIVE
Specific Gravity, Urine: 1.02 (ref 1.000–1.030)
Urobilinogen, UA: 0.2 (ref 0.0–1.0)

## 2013-03-18 LAB — BASIC METABOLIC PANEL
BUN: 17 mg/dL (ref 6–23)
Chloride: 103 mEq/L (ref 96–112)
GFR: 76.02 mL/min (ref >60.00)
Glucose, Bld: 72 mg/dL (ref 70–99)
Potassium: 4.2 mEq/L (ref 3.5–5.1)
Sodium: 139 mEq/L (ref 135–145)

## 2013-03-18 LAB — CBC WITH DIFFERENTIAL/PLATELET
Basophils Relative: 0.6 % (ref 0.0–3.0)
Eosinophils Relative: 2.5 % (ref 0.0–5.0)
Lymphocytes Relative: 32.7 % (ref 12.0–46.0)
Neutrophils Relative %: 57 % (ref 43.0–77.0)
Platelets: 172 10*3/uL (ref 150.0–400.0)
RBC: 4.74 Mil/uL (ref 3.87–5.11)
WBC: 5.9 10*3/uL (ref 4.5–10.5)

## 2013-03-18 LAB — TSH: TSH: 3.84 u[IU]/mL (ref 0.35–5.50)

## 2013-03-18 LAB — HEPATIC FUNCTION PANEL
ALT: 13 U/L (ref 0–35)
AST: 17 U/L (ref 0–37)
Albumin: 4 g/dL (ref 3.5–5.2)
Alkaline Phosphatase: 57 U/L (ref 39–117)
Total Bilirubin: 0.9 mg/dL (ref 0.3–1.2)

## 2013-03-18 MED ORDER — FLUTICASONE PROPIONATE 50 MCG/ACT NA SUSP: 2.0000 | Freq: Every day | NASAL | Status: DC

## 2013-03-18 MED ORDER — PREGABALIN 75 MG PO CAPS: 75.0000 mg | ORAL_CAPSULE | Freq: Two times a day (BID) | ORAL | Status: DC

## 2013-03-18 MED ORDER — LANSOPRAZOLE 30 MG PO CPDR: 30.0000 mg | DELAYED_RELEASE_CAPSULE | Freq: Every day | ORAL | Status: DC

## 2013-03-18 MED ORDER — NAPROXEN 500 MG PO TABS: 500.0000 mg | ORAL_TABLET | Freq: Two times a day (BID) | ORAL | Status: DC

## 2013-03-18 MED ORDER — LEVOTHYROXINE SODIUM 50 MCG PO TABS: 50.0000 ug | ORAL_TABLET | Freq: Every day | ORAL | Status: DC

## 2013-03-18 MED ORDER — CYANOCOBALAMIN 1000 MCG/ML IJ SOLN: 1000.0000 ug | Freq: Once | INTRAMUSCULAR | Status: DC

## 2013-03-18 NOTE — Assessment & Plan Note (Signed)
Continue with current prescription therapy as reflected on the Med list.  

## 2013-03-18 NOTE — Progress Notes (Signed)
   Subjective:     HPI  C/o allergies F/u LBP - achy  The patient is here to follow up on chronic depression, anxiety, headaches and chronic moderate fibromyalgia symptoms partially controlled with medicines.  C/o pink urine on Sat   C/o fatigue - worse  Wt Readings from Last 3 Encounters:  03/18/13 134 lb (60.782 kg)  12/19/12 132 lb (59.875 kg)  08/05/12 132 lb (59.875 kg)    BP Readings from Last 3 Encounters:  03/18/13 130/86  12/19/12 120/88  08/05/12 116/84      Review of Systems  Constitutional: Positive for fatigue. Negative for chills, activity change, appetite change and unexpected weight change.  HENT: Negative for congestion and mouth sores.   Eyes: Negative for visual disturbance.  Respiratory: Negative for chest tightness.   Genitourinary: Negative for frequency, difficulty urinating and vaginal pain.  Musculoskeletal: Positive for myalgias and arthralgias. Negative for gait problem.  Skin: Negative for pallor and rash.  Neurological: Negative for tremors.  Psychiatric/Behavioral: Positive for sleep disturbance. Negative for suicidal ideas and confusion. The patient is nervous/anxious.        Objective:   Physical Exam  Constitutional: She appears well-developed and well-nourished. No distress.  HENT:  Head: Normocephalic.  Right Ear: External ear normal.  Left Ear: External ear normal.  Nose: Nose normal.  Mouth/Throat: Oropharynx is clear and moist.  Eyes: Conjunctivae are normal. Pupils are equal, round, and reactive to [place]. Right eye exhibits no discharge. Left eye exhibits no discharge.  Neck: Normal range of motion. Neck supple. No JVD present. No tracheal deviation present. No thyromegaly present.  Cardiovascular: Normal rate, regular rhythm and normal heart sounds.   Pulmonary/Chest: No stridor. No respiratory distress. She has no wheezes.  Abdominal: Soft. Bowel sounds are normal. She exhibits no distension and no mass. There is no  tenderness. There is no rebound and no guarding.  Musculoskeletal: She exhibits tenderness (many spots on back and neck are tender). She exhibits no edema.  Lymphadenopathy:    She has no cervical adenopathy.  Neurological: She displays normal reflexes. No cranial nerve deficit. She exhibits normal muscle tone. Coordination normal.  Skin: No rash noted. No erythema.  Psychiatric: She has a normal mood and affect. Her behavior is normal. Judgment and thought content normal.  sad   Lab Results  Component Value Date   WBC 6.9 03/16/2011   HGB 14.7 03/16/2011   HCT 42.9 03/16/2011   PLT 162.0 03/16/2011   GLUCOSE 74 12/19/2012   CHOL 174 05/29/2010   TRIG 38.0 05/29/2010   HDL 54.20 05/29/2010   LDLCALC 112* 05/29/2010   ALT 14 12/19/2012   AST 18 12/19/2012   NA 139 12/19/2012   K 3.6 12/19/2012   CL 106 12/19/2012   CREATININE 0.7 12/19/2012   BUN 17 12/19/2012   CO2 29 12/19/2012   TSH 2.92 12/19/2012          Assessment & Plan:

## 2013-03-18 NOTE — Assessment & Plan Note (Signed)
UA

## 2013-04-02 ENCOUNTER — Telehealth: Payer: Self-pay | Admitting: Internal Medicine

## 2013-04-02 NOTE — Telephone Encounter (Signed)
Pt has a headache and sore throat.  Chest congestion and sinus.  She is requesting antibiotics to be called in to [facility]. She declined an appt with Dr. Jonny Ruiz on Thurs afternoon.

## 2013-04-03 MED ORDER — AZITHROMYCIN 250 MG PO TABS: ORAL_TABLET | ORAL | Status: DC

## 2013-04-03 NOTE — Telephone Encounter (Signed)
Ok Zpac Thx 

## 2013-04-03 NOTE — Telephone Encounter (Signed)
Done. Pt informed.

## 2013-04-24 ENCOUNTER — Telehealth: Payer: Self-pay

## 2013-04-24 MED ORDER — CYANOCOBALAMIN 1000 MCG/ML IJ SOLN: 1000.0000 ug | INTRAMUSCULAR | Status: DC

## 2013-04-24 NOTE — Telephone Encounter (Signed)
Patient called stating that she has been trying to get the pharmacy to refill her b12 injections but they will not due to needing to hear back from our office regarding directions. Per pt she injects twice monthly, updated rx sent via e-script to [facility]

## 2013-06-11 ENCOUNTER — Encounter: Payer: Self-pay | Admitting: Internal Medicine

## 2013-06-11 ENCOUNTER — Ambulatory Visit (INDEPENDENT_AMBULATORY_CARE_PROVIDER_SITE_OTHER): Payer: 59 | Admitting: Internal Medicine

## 2013-06-11 VITALS — BP 140/82 | HR 80 | Temp 98.4°F | Resp 16 | Wt 134.0 lb

## 2013-06-11 DIAGNOSIS — M545 Low back pain, unspecified: Secondary | ICD-10-CM

## 2013-06-11 DIAGNOSIS — K219 Gastro-esophageal reflux disease without esophagitis: Secondary | ICD-10-CM

## 2013-06-11 DIAGNOSIS — F411 Generalized anxiety disorder: Secondary | ICD-10-CM

## 2013-06-11 DIAGNOSIS — K120 Recurrent oral aphthae: Secondary | ICD-10-CM

## 2013-06-11 DIAGNOSIS — E538 Deficiency of other specified B group vitamins: Secondary | ICD-10-CM

## 2013-06-11 MED ORDER — AMOXICILLIN 500 MG PO CAPS: 1000.0000 mg | ORAL_CAPSULE | Freq: Two times a day (BID) | ORAL | Status: DC

## 2013-06-11 MED ORDER — ACYCLOVIR 400 MG PO TABS: 400.0000 mg | ORAL_TABLET | Freq: Three times a day (TID) | ORAL | Status: DC

## 2013-06-11 MED ORDER — PIROXICAM 20 MG PO CAPS: 20.0000 mg | ORAL_CAPSULE | Freq: Every day | ORAL | Status: DC

## 2013-06-11 NOTE — Assessment & Plan Note (Signed)
Continue with current prescription therapy as reflected on the Med list.  

## 2013-06-11 NOTE — Progress Notes (Signed)
   Subjective:     HPI  F/u allergies C/o mouth ulcer F/u LBP - achy  The patient is here to follow up on chronic depression, anxiety, headaches and chronic moderate fibromyalgia symptoms partially controlled with medicines. C/o sinusitis sx's C/o fatigue - worse  Wt Readings from Last 3 Encounters:  06/11/13 134 lb (60.782 kg)  03/18/13 134 lb (60.782 kg)  12/19/12 132 lb (59.875 kg)    BP Readings from Last 3 Encounters:  06/11/13 140/82  03/18/13 130/86  12/19/12 120/88      Review of Systems  Constitutional: Positive for fatigue. Negative for chills, activity change, appetite change and unexpected weight change.  HENT: Negative for congestion and mouth sores.   Eyes: Negative for visual disturbance.  Respiratory: Negative for chest tightness.   Genitourinary: Negative for frequency, difficulty urinating and vaginal pain.  Musculoskeletal: Positive for myalgias and arthralgias. Negative for gait problem.  Skin: Negative for pallor and rash.  Neurological: Negative for tremors.  Psychiatric/Behavioral: Positive for sleep disturbance. Negative for suicidal ideas and confusion. The patient is nervous/anxious.        Objective:   Physical Exam  Constitutional: She appears well-developed and well-nourished. No distress.  HENT:  Head: Normocephalic.  Right Ear: External ear normal.  Left Ear: External ear normal.  Nose: Nose normal.  Mouth/Throat: Oropharynx is clear and moist.  Eyes: Conjunctivae are normal. Pupils are equal, round, and reactive to [place]. Right eye exhibits no discharge. Left eye exhibits no discharge.  Neck: Normal range of motion. Neck supple. No JVD present. No tracheal deviation present. No thyromegaly present.  Cardiovascular: Normal rate, regular rhythm and normal heart sounds.   Pulmonary/Chest: No stridor. No respiratory distress. She has no wheezes.  Abdominal: Soft. Bowel sounds are normal. She exhibits no distension and no mass. There is  no tenderness. There is no rebound and no guarding.  Musculoskeletal: She exhibits tenderness (many spots on back and neck are tender). She exhibits no edema.  Lymphadenopathy:    She has no cervical adenopathy.  Neurological: She displays normal reflexes. No cranial nerve deficit. She exhibits normal muscle tone. Coordination normal.  Skin: No rash noted. No erythema.  Psychiatric: She has a normal mood and affect. Her behavior is normal. Judgment and thought content normal.  sad  R cheek ulcer 3 mm  Lab Results  Component Value Date   WBC 5.9 03/18/2013   HGB 14.0 03/18/2013   HCT 40.8 03/18/2013   PLT 172.0 03/18/2013   GLUCOSE 72 03/18/2013   CHOL 174 05/29/2010   TRIG 38.0 05/29/2010   HDL 54.20 05/29/2010   LDLCALC 112* 05/29/2010   ALT 13 03/18/2013   AST 17 03/18/2013   NA 139 03/18/2013   K 4.2 03/18/2013   CL 103 03/18/2013   CREATININE 0.8 03/18/2013   BUN 17 03/18/2013   CO2 30 03/18/2013   TSH 3.84 03/18/2013          Assessment & Plan:

## 2013-06-11 NOTE — Assessment & Plan Note (Signed)
8/14 R inner cheek

## 2013-06-11 NOTE — Patient Instructions (Signed)
Gluten free trial (no wheat products) x4-6 weeks. OK to use Gluten-free bread and pasta. Milk free trial (no milk, ice cream and yogurt) x4 weeks. OK to use almond or soy milk. 

## 2013-06-17 ENCOUNTER — Ambulatory Visit: Payer: 59 | Admitting: Internal Medicine

## 2013-08-13 ENCOUNTER — Telehealth: Payer: Self-pay | Admitting: Internal Medicine

## 2013-08-13 NOTE — Telephone Encounter (Signed)
Pt is trying to qualify for a different kind of heating source.  She uses wood.  She needs a letter stating that it hurts her back to carry in wood.

## 2013-08-17 NOTE — Telephone Encounter (Signed)
Done. Pls print Thx

## 2013-08-18 NOTE — Telephone Encounter (Signed)
LMOM for pt to call. 

## 2013-08-18 NOTE — Telephone Encounter (Signed)
Letter printed/ready for p/u. Left detailed mess informing pt.

## 2013-08-19 NOTE — Telephone Encounter (Signed)
A user error has taken place.

## 2013-08-25 ENCOUNTER — Telehealth: Payer: Self-pay | Admitting: Internal Medicine

## 2013-08-25 MED ORDER — BENZONATATE 200 MG PO CAPS: 200.0000 mg | ORAL_CAPSULE | Freq: Two times a day (BID) | ORAL | Status: DC | PRN

## 2013-08-25 NOTE — Telephone Encounter (Signed)
will email Tessalon  Use over-the-counter  "cold" medicines  such as "Tylenol cold" , "Advil cold",  "Mucinex" or" Mucinex D"  for cough and congestion.    "Common cold" symptoms are usually triggered by a virus.  The antibiotics are usually not necessary. On average, a" viral cold" illness would take 4-7 days to resolve. Please, make an appointment if you are not better or if you're worse.  Thx

## 2013-08-25 NOTE — Telephone Encounter (Signed)
Pt came in request Dr. Macario Golds to call in something for her cough and congestion. Pt stated that she have been taking mucinex but its not helping. Offer an appt at 4 pm today, but pt stated she can not come. Please advise.

## 2013-08-25 NOTE — Telephone Encounter (Signed)
Pt informed

## 2013-09-02 ENCOUNTER — Encounter: Payer: Self-pay | Admitting: Internal Medicine

## 2013-09-02 ENCOUNTER — Ambulatory Visit (INDEPENDENT_AMBULATORY_CARE_PROVIDER_SITE_OTHER): Payer: 59 | Admitting: Internal Medicine

## 2013-09-02 VITALS — BP 110/70 | HR 76 | Temp 97.0°F | Resp 16 | Wt 136.0 lb

## 2013-09-02 DIAGNOSIS — Q846 Other congenital malformations of nails: Secondary | ICD-10-CM

## 2013-09-02 DIAGNOSIS — J019 Acute sinusitis, unspecified: Secondary | ICD-10-CM

## 2013-09-02 DIAGNOSIS — J069 Acute upper respiratory infection, unspecified: Secondary | ICD-10-CM

## 2013-09-02 DIAGNOSIS — E538 Deficiency of other specified B group vitamins: Secondary | ICD-10-CM

## 2013-09-02 DIAGNOSIS — L608 Other nail disorders: Secondary | ICD-10-CM

## 2013-09-02 MED ORDER — CEFUROXIME AXETIL 500 MG PO TABS: ORAL_TABLET | ORAL | Status: DC

## 2013-09-02 MED ORDER — OMEPRAZOLE 40 MG PO CPDR: 40.0000 mg | DELAYED_RELEASE_CAPSULE | Freq: Every day | ORAL | Status: DC

## 2013-09-02 NOTE — Assessment & Plan Note (Addendum)
See meds Ceftin Pt declined CXR

## 2013-09-02 NOTE — Progress Notes (Signed)
Pre-visit discussion using our clinic review tool. No additional management support is needed unless otherwise documented below in the visit note.  

## 2013-09-02 NOTE — Assessment & Plan Note (Signed)
See rx. 

## 2013-09-02 NOTE — Assessment & Plan Note (Signed)
R big toe Podiatry ref offered.

## 2013-09-02 NOTE — Patient Instructions (Addendum)
   Milk free trial (no milk, ice cream, cheese and yogurt) for 4-6 weeks. OK to use almond, coconut, rice or soy milk. "Almond breeze" brand tastes good.   Use over-the-counter  "cold" medicines  such as "Tylenol cold" , "Advil cold",  "Mucinex" or" Mucinex D"  for cough and congestion.   Avoid decongestants if you have high blood pressure and use "Afrin" nasal spray for nasal congestion as directed instead. Use" Delsym" or" Robitussin" cough syrup varietis for cough.  You can use plain "Tylenol" or "Advil" for fever, chills and achyness.    Please, make an appointment if you are not better or if you're worse.

## 2013-09-02 NOTE — Assessment & Plan Note (Signed)
Continue with current prescription therapy as reflected on the Med list.  

## 2013-09-02 NOTE — Progress Notes (Signed)
   Subjective:     Cough This is a new problem. The current episode started 1 to 4 weeks ago. The problem has been gradually worsening. The problem occurs every few minutes. The cough is non-productive. Associated symptoms include myalgias. Pertinent negatives include no chills or rash. Nothing aggravates the symptoms. She has tried OTC cough suppressant and prescription cough suppressant for the symptoms. The treatment provided no relief. Her past medical history is significant for bronchitis.    F/u allergies  F/u LBP - achy  The patient is here to follow up on chronic depression, anxiety, headaches and chronic moderate fibromyalgia symptoms partially controlled with medicines. C/o sinusitis sx's C/o fatigue - worse  Wt Readings from Last 3 Encounters:  09/02/13 136 lb (61.689 kg)  06/11/13 134 lb (60.782 kg)  03/18/13 134 lb (60.782 kg)    BP Readings from Last 3 Encounters:  09/02/13 110/70  06/11/13 140/82  03/18/13 130/86      Review of Systems  Constitutional: Positive for fatigue. Negative for chills, activity change, appetite change and unexpected weight change.  HENT: Negative for congestion and mouth sores.   Eyes: Negative for visual disturbance.  Respiratory: Positive for cough. Negative for chest tightness.   Genitourinary: Negative for frequency, difficulty urinating and vaginal pain.  Musculoskeletal: Positive for arthralgias and myalgias. Negative for gait problem.  Skin: Negative for pallor and rash.  Neurological: Negative for tremors.  Psychiatric/Behavioral: Positive for sleep disturbance. Negative for suicidal ideas and confusion. The patient is nervous/anxious.        Objective:   Physical Exam  Constitutional: She appears well-developed and well-nourished. No distress.  HENT:  Head: Normocephalic.  Right Ear: External ear normal.  Left Ear: External ear normal.  Nose: Nose normal.  Mouth/Throat: Oropharynx is clear and moist.  Eyes:  Conjunctivae are normal. Pupils are equal, round, and reactive to [place]. Right eye exhibits no discharge. Left eye exhibits no discharge.  Neck: Normal range of motion. Neck supple. No JVD present. No tracheal deviation present. No thyromegaly present.  Cardiovascular: Normal rate, regular rhythm and normal heart sounds.   Pulmonary/Chest: No stridor. No respiratory distress. She has no wheezes.  Abdominal: Soft. Bowel sounds are normal. She exhibits no distension and no mass. There is no tenderness. There is no rebound and no guarding.  Musculoskeletal: She exhibits tenderness (many spots on back and neck are tender). She exhibits no edema.  Lymphadenopathy:    She has no cervical adenopathy.  Neurological: She displays normal reflexes. No cranial nerve deficit. She exhibits normal muscle tone. Coordination normal.  Skin: No rash noted. No erythema.  Psychiatric: She has a normal mood and affect. Her behavior is normal. Judgment and thought content normal.  sad    Lab Results  Component Value Date   WBC 5.9 03/18/2013   HGB 14.0 03/18/2013   HCT 40.8 03/18/2013   PLT 172.0 03/18/2013   GLUCOSE 72 03/18/2013   CHOL 174 05/29/2010   TRIG 38.0 05/29/2010   HDL 54.20 05/29/2010   LDLCALC 112* 05/29/2010   ALT 13 03/18/2013   AST 17 03/18/2013   NA 139 03/18/2013   K 4.2 03/18/2013   CL 103 03/18/2013   CREATININE 0.8 03/18/2013   BUN 17 03/18/2013   CO2 30 03/18/2013   TSH 3.84 03/18/2013          Assessment & Plan:

## 2013-09-11 ENCOUNTER — Ambulatory Visit: Payer: 59 | Admitting: Internal Medicine

## 2013-10-21 ENCOUNTER — Other Ambulatory Visit: Payer: Self-pay | Admitting: Internal Medicine

## 2013-10-29 LAB — HM MAMMOGRAPHY

## 2013-10-29 LAB — HM PAP SMEAR

## 2013-11-08 ENCOUNTER — Emergency Department (HOSPITAL_COMMUNITY): Payer: PRIVATE HEALTH INSURANCE

## 2013-11-08 ENCOUNTER — Encounter (HOSPITAL_COMMUNITY): Payer: Self-pay | Admitting: Emergency Medicine

## 2013-11-08 ENCOUNTER — Ambulatory Visit (HOSPITAL_COMMUNITY): Payer: Medicare Other

## 2013-11-08 ENCOUNTER — Emergency Department (HOSPITAL_COMMUNITY)
Admission: EM | Admit: 2013-11-08 | Discharge: 2013-11-08 | Disposition: A | Discharge status: Home / Self Care | Payer: PRIVATE HEALTH INSURANCE | Attending: Emergency Medicine | Admitting: Emergency Medicine

## 2013-11-08 DIAGNOSIS — J3489 Other specified disorders of nose and nasal sinuses: Secondary | ICD-10-CM | POA: Insufficient documentation

## 2013-11-08 DIAGNOSIS — R52 Pain, unspecified: Secondary | ICD-10-CM | POA: Insufficient documentation

## 2013-11-08 DIAGNOSIS — Z79899 Other long term (current) drug therapy: Secondary | ICD-10-CM | POA: Insufficient documentation

## 2013-11-08 DIAGNOSIS — N898 Other specified noninflammatory disorders of vagina: Secondary | ICD-10-CM | POA: Insufficient documentation

## 2013-11-08 DIAGNOSIS — R42 Dizziness and giddiness: Secondary | ICD-10-CM | POA: Insufficient documentation

## 2013-11-08 DIAGNOSIS — K219 Gastro-esophageal reflux disease without esophagitis: Secondary | ICD-10-CM | POA: Insufficient documentation

## 2013-11-08 DIAGNOSIS — M549 Dorsalgia, unspecified: Secondary | ICD-10-CM

## 2013-11-08 DIAGNOSIS — G8929 Other chronic pain: Secondary | ICD-10-CM | POA: Insufficient documentation

## 2013-11-08 DIAGNOSIS — IMO0002 Reserved for concepts with insufficient information to code with codable children: Secondary | ICD-10-CM | POA: Insufficient documentation

## 2013-11-08 DIAGNOSIS — E538 Deficiency of other specified B group vitamins: Secondary | ICD-10-CM | POA: Insufficient documentation

## 2013-11-08 DIAGNOSIS — M546 Pain in thoracic spine: Secondary | ICD-10-CM | POA: Insufficient documentation

## 2013-11-08 DIAGNOSIS — Z87891 Personal history of nicotine dependence: Secondary | ICD-10-CM | POA: Insufficient documentation

## 2013-11-08 DIAGNOSIS — Z791 Long term (current) use of non-steroidal anti-inflammatories (NSAID): Secondary | ICD-10-CM | POA: Insufficient documentation

## 2013-11-08 DIAGNOSIS — IMO0001 Reserved for inherently not codable concepts without codable children: Secondary | ICD-10-CM | POA: Insufficient documentation

## 2013-11-08 DIAGNOSIS — F319 Bipolar disorder, unspecified: Secondary | ICD-10-CM | POA: Insufficient documentation

## 2013-11-08 DIAGNOSIS — F411 Generalized anxiety disorder: Secondary | ICD-10-CM | POA: Insufficient documentation

## 2013-11-08 HISTORY — DX: Headache, unspecified: R51.9

## 2013-11-08 HISTORY — DX: Nasal congestion: R09.81

## 2013-11-08 HISTORY — DX: Dorsalgia, unspecified: M54.9

## 2013-11-08 HISTORY — DX: Other fatigue: R53.83

## 2013-11-08 HISTORY — DX: Cervicalgia: M54.2

## 2013-11-08 HISTORY — DX: Fibromyalgia: M79.7

## 2013-11-08 HISTORY — DX: Headache: R51

## 2013-11-08 HISTORY — DX: Dizziness and giddiness: R42

## 2013-11-08 HISTORY — DX: Other chronic pain: G89.29

## 2013-11-08 LAB — URINALYSIS W MICROSCOPIC + REFLEX CULTURE
BILIRUBIN URINE: NEGATIVE
Glucose, UA: NEGATIVE mg/dL
Ketones, ur: NEGATIVE mg/dL
Leukocytes, UA: NEGATIVE
NITRITE: NEGATIVE
PH: 6 (ref 5.0–8.0)
Protein, ur: NEGATIVE mg/dL
Specific Gravity, Urine: 1.015 (ref 1.005–1.030)
UROBILINOGEN UA: 0.2 mg/dL (ref 0.0–1.0)

## 2013-11-08 LAB — WET PREP, GENITAL
CLUE CELLS WET PREP: NONE SEEN
Trich, Wet Prep: NONE SEEN
Yeast Wet Prep HPF POC: NONE SEEN

## 2013-11-08 MED ORDER — CEFTRIAXONE SODIUM 250 MG IJ SOLR
250.0000 mg | Freq: Once | INTRAMUSCULAR | Status: AC
  Administered 2013-11-08: 250 mg via INTRAMUSCULAR
  Filled 2013-11-08: qty 250

## 2013-11-08 MED ORDER — MECLIZINE HCL 12.5 MG PO TABS
25.0000 mg | ORAL_TABLET | Freq: Once | ORAL | Status: AC
  Administered 2013-11-08: 25 mg via ORAL
  Filled 2013-11-08: qty 2

## 2013-11-08 MED ORDER — LIDOCAINE HCL (PF) 1 % IJ SOLN
INTRAMUSCULAR | Status: AC
  Administered 2013-11-08: 0.9 mL
  Filled 2013-11-08: qty 5

## 2013-11-08 MED ORDER — AZITHROMYCIN 250 MG PO TABS
1000.0000 mg | ORAL_TABLET | Freq: Once | ORAL | Status: AC
  Administered 2013-11-08: 1000 mg via ORAL
  Filled 2013-11-08: qty 4

## 2013-11-08 NOTE — ED Notes (Signed)
Pt c/o middle back pain, green vaginal discharge, odor, generalized weakness for the past few days,

## 2013-11-08 NOTE — ED Notes (Signed)
Pt refused chest x-ray

## 2013-11-08 NOTE — ED Notes (Signed)
nad noted prior to dc. Dc instructions reviewed and f/u with GYN in one week. Pt voiced understanding.

## 2013-11-08 NOTE — ED Provider Notes (Signed)
CSN: [id number]     Arrival date & time 11/08/13  1641 History   First MD Initiated Contact with Patient 11/08/13 1649     Chief Complaint  Patient presents with  . Vaginal Discharge    HPI Pt was seen at 1700. Per pt, c/o gradual onset and persistence of constant vaginal discharge for the past 5 days. Describes the discharge as "green," has been associated with suprapubic discomfort. Pt states she and her husband recently "got back together" before the discharge began and she is concerned regarding STD. Denies dysuria, no vaginal bleeding, no pelvic pain, no fevers, no rash, no N/V/D. Pt also c/o gradual onset and persistence of multiple other symptoms over the past week, including: sinus and nasal congestion, acute flair of her chronic back pain, generalized fatigue, and "vertigo." Pt describes her vertigo as per her previous hx of same and "everything is spinning around," which worsens with turning her head side-to-side and body position changes. Denies CP/palpitations, no SOB/cough, no visual changes, no focal motor weakness, no tingling/numbness in extremities, no ataxia, no slurred speech, no facial droop.     Past Medical History  Diagnosis Date  . Depression     bipolar- Dr [name]  . Anxiety   . Low back pain     Dr Arnoldo Morale  . GERD (gastroesophageal reflux disease)   . Vitamin B12 deficiency   . Sinus congestion   . Vertigo   . Fibromyalgia   . Headache   . Chronic back pain   . Fatigue   . Chronic neck pain    Past Surgical History  Procedure Laterality Date  . Cervical laminectomy  2001 and 2995    Dr Arnoldo Morale   Family History  Problem Relation Age of Onset  . Arthritis Other    History  Substance Use Topics  . Smoking status: Former Smoker    Quit date: 05/06/2011  . Smokeless tobacco: Not on file  . Alcohol Use: No    Review of Systems ROS: Statement: All systems negative except as marked or noted in the HPI; Constitutional: Negative for fever and chills.  +generalized fatigue; ; Eyes: Negative for eye pain, redness and discharge. ; ; ENMT: Negative for ear pain, hoarseness, sore throat. +nasal congestion, sinus pressure. ; ; Cardiovascular: Negative for chest pain, palpitations, diaphoresis, dyspnea and peripheral edema. ; ; Respiratory: Negative for cough, wheezing and stridor. ; ; Gastrointestinal: Negative for nausea, vomiting, diarrhea, abdominal pain, blood in stool, hematemesis, jaundice and rectal bleeding. . ; ; Genitourinary: Negative for dysuria, flank pain and hematuria. ; ; GYN:  No vaginal bleeding, +vaginal discharge, no vulvar pain.;; Musculoskeletal: +chronic back pain. Negative for neck pain. Negative for swelling and trauma.; ; Skin: Negative for pruritus, rash, abrasions, blisters, bruising and skin lesion.; ; Neuro: +"spinning." Negative for headache, lightheadedness and neck stiffness. Negative for weakness, altered level of consciousness , altered mental status, extremity weakness, paresthesias, involuntary movement, seizure and syncope.     Allergies  Codeine sulfate; Doxycycline hyclate; Morphine sulfate; Nabumetone; Oxycodone; Sulfadiazine; Tetracycline hcl; Tramadol hcl; and Zithromax  Home Medications   Current Outpatient Rx  Name  Route  Sig  Dispense  Refill  . acyclovir (ZOVIRAX) 400 MG tablet   Oral   Take 1 tablet (400 mg total) by mouth 3 (three) times daily.   21 tablet   3   . ALPRAZolam (XANAX) 1 MG tablet   Oral   Take 1 mg by mouth 3 (three) times daily as needed.          Marland Kitchen  amphetamine-dextroamphetamine (ADDERALL) 20 MG tablet   Oral   Take 20 mg by mouth 2 (two) times daily.          . benzonatate (TESSALON) 200 MG capsule   Oral   Take 1 capsule (200 mg total) by mouth 2 (two) times daily as needed for cough.   20 capsule   0   . cefUROXime (CEFTIN) 500 MG tablet      1 po bid   20 tablet   0   . cetirizine (ZYRTEC) 10 MG tablet   Oral   Take 1 tablet (10 mg total) by mouth daily.    100 tablet   3   . chlorpheniramine-HYDROcodone (TUSSIONEX PENNKINETIC ER) 10-8 MG/5ML LQCR   Oral   Take 5 mLs by mouth every 12 (twelve) hours as needed.   100 mL   0   . cyanocobalamin (,VITAMIN B-12,) 1000 MCG/ML injection   Intramuscular   Inject 1 mL (1,000 mcg total) into the muscle every 14 (fourteen) days.   10 mL   6   . Diclofenac Sodium (PENNSAID) 1.5 % SOLN   Transdermal   Place onto the skin. 3-5 gtt on a joint bid prn          . eletriptan (RELPAX) 20 MG tablet   Oral   One tablet by mouth as needed for migraine headache.  If the headache improves and then returns, dose may be repeated after 2 hours have elapsed since first dose (do not exceed 80 mg per day). may repeat in 2 hours if necessary          . ergocalciferol (VITAMIN D2) 50000 UNITS capsule   Oral   Take 50,000 Units by mouth as directed. Every 3-4 weeks          . fluticasone (FLONASE) 50 MCG/ACT nasal spray   Nasal   Place 2 sprays into the nose daily.   16 g   6   . lansoprazole (PREVACID) 30 MG capsule   Oral   Take 1 capsule (30 mg total) by mouth daily.   90 capsule   3   . levothyroxine (SYNTHROID, LEVOTHROID) 50 MCG tablet   Oral   Take 1 tablet (50 mcg total) by mouth daily.   90 tablet   3   . lidocaine (LIDODERM) 5 %   Transdermal   Place onto the skin as directed. Use 1-2 bid prn          . loratadine (CLARITIN) 10 MG tablet   Oral   Take 1 tablet (10 mg total) by mouth daily.   100 tablet   3   . omeprazole (PRILOSEC) 40 MG capsule   Oral   Take 1 capsule (40 mg total) by mouth daily.   30 capsule   3   . EXPIRED: oxyCODONE-acetaminophen (TYLOX) 5-500 MG per capsule   Oral   Take 1 capsule by mouth every 8 (eight) hours as needed.         . piroxicam (FELDENE) 20 MG capsule   Oral   Take 1 capsule (20 mg total) by mouth daily.   30 capsule   3   . EXPIRED: polyethylene glycol powder (GLYCOLAX/MIRALAX) powder   Oral   Take 17 g by mouth daily. For  constipation         . pregabalin (LYRICA) 75 MG capsule   Oral   Take 1 capsule (75 mg total) by mouth 2 (two) times daily.   [place]  capsule   5   . PROAIR HFA 108 (90 BASE) MCG/ACT inhaler      INHALE 2 PUFFS EVERY 4 HOURS AS NEEDED.   25.5 g   2     10/21/2013 1:09:56 PM   . promethazine (PHENERGAN) 25 MG tablet   Oral   Take 1 tablet (25 mg total) by mouth as needed.   60 tablet   1   . pseudoephedrine-acetaminophen (TYLENOL SINUS) 30-500 MG TABS   Oral   Take 1 tablet by mouth as needed.           Marland Kitchen QUEtiapine (SEROQUEL) 25 MG tablet   Oral   Take 25 mg by mouth at bedtime.           . sennosides-docusate sodium (SENOKOT-S) 8.6-50 MG tablet   Oral   Take 1 tablet by mouth 2 (two) times daily.           Marland Kitchen triamcinolone (NASACORT) 55 MCG/ACT nasal inhaler   Nasal   Place 1 spray into the nose daily.   1 Inhaler   11   . triamcinolone cream (KENALOG) 0.5 %   Topical   Apply topically 2 (two) times daily.   90 g   3    BP 139/73  Pulse 85  Temp(Src) 97.7 F (36.5 C) (Oral)  Resp 20  Ht 5\' 6"  (1.676 m)  Wt 139 lb (63.05 kg)  BMI 22.45 kg/m2  SpO2 98% Physical Exam 1705: Physical examination:  Nursing notes reviewed; Vital signs and O2 SAT reviewed;  Constitutional: Well developed, Well nourished, Well hydrated, In no acute distress; Head:  Normocephalic, atraumatic; Eyes: EOMI, PERRL, No scleral icterus; ENMT: TM's clear bilat. +edemetous nasal turbinates bilat with clear rhinorrhea. Mouth and pharynx normal, Mucous membranes moist; Neck: Supple, Full range of motion, No lymphadenopathy; Cardiovascular: Regular rate and rhythm, No murmur, rub, or gallop; Respiratory: Breath sounds clear & equal bilaterally, No rales, rhonchi, wheezes.  Speaking full sentences with ease, Normal respiratory effort/excursion; Chest: Nontender, Movement normal; Abdomen: Soft, Nontender, Nondistended, Normal bowel sounds; Genitourinary: No CVA tenderness. Pelvic exam performed  with permission of pt and female ED tech assist during exam.  External genitalia w/o lesions. Vaginal vault without discharge.  Cervix w/o lesions, not friable, GC/chlam and wet prep obtained and sent to lab.  Bimanual exam w/o CMT, uterine or adnexal tenderness.;; Extremities: Pulses normal, No tenderness, No edema, No calf edema or asymmetry.; Spine:  No midline CS, TS, LS tenderness. +TTP bilat lower thoracic/upper lumbar paraspinal muscles. No rash.;; Neuro: AA&Ox3, Major CN grossly intact. No facial droop. Speech clear. No gross focal motor or sensory deficits in extremities. Climbs on and off stretcher easily by herself. Gait steady. +left horizontal end gaze fatigable nystagmus which reproduces pt's "spinning."; Skin: Color normal, Warm, Dry.; Psych:  Anxious, tearful at times.    ED Course  Procedures  EKG Interpretation   None       MDM  MDM Reviewed: previous chart, nursing note and vitals Reviewed previous: labs Interpretation: labs      Results for orders placed during the hospital encounter of 11/08/13  WET PREP, GENITAL      Result Value Range   Yeast Wet Prep HPF POC NONE SEEN  NONE SEEN   Trich, Wet Prep NONE SEEN  NONE SEEN   Clue Cells Wet Prep HPF POC NONE SEEN  NONE SEEN   WBC, Wet Prep HPF POC FEW (*) NONE SEEN  URINALYSIS W MICROSCOPIC + REFLEX CULTURE  Result Value Range   Color, Urine YELLOW  YELLOW   APPearance CLEAR  CLEAR   Specific Gravity, Urine 1.015  1.005 - 1.030   pH 6.0  5.0 - 8.0   Glucose, UA NEGATIVE  NEGATIVE mg/dL   Hgb urine dipstick TRACE (*) NEGATIVE   Bilirubin Urine NEGATIVE  NEGATIVE   Ketones, ur NEGATIVE  NEGATIVE mg/dL   Protein, ur NEGATIVE  NEGATIVE mg/dL   Urobilinogen, UA 0.2  0.0 - 1.0 mg/dL   Nitrite NEGATIVE  NEGATIVE   Leukocytes, UA NEGATIVE  NEGATIVE   WBC, UA 0-2  <3 WBC/hpf   RBC / HPF 0-2  <3 RBC/hpf   Bacteria, UA RARE  RARE   Squamous Epithelial / LPF RARE  RARE     1930:  Not orthostatic on VS. Pt has  been ambulatory several times with steady gait, easy resps, NAD while in the ED.  Neuro exam unchanged. Pt refuses XR: states she "knows it's probably just my regular back pain." States she feels "hungry" after the antivert and wants to go home now. She is "unsure" if it improved her vertigo. No vaginal discharge on exam today, GC/chlam pending but pt concerned regarding STD exposure. Will tx prophylactically. Strongly encouraged to f/u with OB/GYN. Dx and testing d/w pt.  Questions answered.  Verb understanding, agreeable to d/c home with outpt f/u.     Alfonzo Feller, DO 11/11/13 [phone]

## 2013-11-08 NOTE — ED Notes (Signed)
Patient refused x-ray. MD aware.

## 2013-11-08 NOTE — Discharge Instructions (Signed)
Emergency Department Resource Guide 1) Find a Doctor and Pay Out of Pocket Although you won't have to find out who is covered by your insurance plan, it is a good idea to ask around and get recommendations. You will then need to call the office and see if the doctor you have chosen will accept you as a new patient and what types of options they offer for patients who are self-pay. Some doctors offer discounts or will set up payment plans for their patients who do not have insurance, but you will need to ask so you aren't surprised when you get to your appointment.  2) Contact Your Local Health Department Not all health departments have doctors that can see patients for sick visits, but many do, so it is worth a call to see if yours does. If you don't know where your local health department is, you can check in your phone book. The CDC also has a tool to help you locate your state's health department, and many state websites also have listings of all of their local health departments.  3) Find a [facility] If your illness is not likely to be very severe or complicated, you may want to try a walk in clinic. These are popping up all over the country in pharmacies, drugstores, and shopping centers. They're usually staffed by nurse practitioners or physician assistants that have been trained to treat common illnesses and complaints. They're usually fairly quick and inexpensive. However, if you have serious medical issues or chronic medical problems, these are probably not your best option.  No Primary Care Doctor: - Call Health Connect at  [phone] - they can help you locate a primary care doctor that  accepts your insurance, provides certain services, etc. - Physician Referral Service- [phone]  Chronic Pain Problems: Organization         Address  Phone   Notes  [facility]  [phone] Patients need to be referred by their primary care doctor.   Medication  Assistance: Organization         Address  Phone   Notes  [facility] Medication [facility] [address] [place], [place] [address] [phone] --Must be a resident of [facility] -- Must have NO insurance coverage whatsoever (no Medicaid/ Medicare, etc.) -- The pt. MUST have a primary care doctor that directs their care regularly and follows them in the community   [facility]  [phone]   [employer]  [phone]    Agencies that provide inexpensive medical care: Organization         Address  Phone   Notes  [place]  [phone]   [facility]    [phone]   [facility] [place], [place] [address] [phone]   [place] [address], [place] [phone]   Planned Parenthood    [phone]   [facility]    [phone]   [place] and [place] [address], [place] Phone:  [phone], Fax:  [phone] Hours of Operation:  9 am - 6 pm, M-F.  Also accepts Medicaid/Medicare and self-pay.  [facility] for [place], [address], [place] Phone: [phone], Fax: [phone]. Hours of Operation:  8:30 am - 5:30 pm, M-F.  Also accepts Medicaid and self-pay.  [facility] [address], [place] Phone: [phone]   [place], [place], [place] [phone], Ext. [phone] Mondays & Thursdays: 7-9 AM.  First 15 patients are seen on a first come, first serve basis.    [place] Providers:  Organization         Address  Phone   Notes  [facility] [address], [place] [phone] Also accepts self-pay patients.  [facility] [id number] [place], [place]  [phone]   [place], [address], [place]  [phone]   [facility] [address] [place] [phone]   Lucianne Lei [address], [place]   [phone] Only accepts [place] Access [place] patients after they have their name applied to their card.   Self-Pay (no insurance) in [facility]  Organization         Address  Phone   Notes  Sickle Cell Patients, [facility] [place] [phone]   [facility] [place] [phone]   [facility] [place]  [place], [place], [place] [phone]   [facility]/Dr. Osei-Bonsu  [address], [place] or [address], [place] [phone] Phone number for both [place] and [place] locations is the same.  Urgent Medical and [facility] [address], [place] [phone]   [facility] [address] [place] or [address] [phone]   [facility] [address] [place] [phone], phone; [phone], fax Sees patients 1st and 3rd Saturday of every month.  Must not qualify for public or private insurance (i.e. Medicaid, Medicare, [place] Health Choice, Veterans' Benefits) . Household income should be no more than 200% of the poverty level .The clinic cannot treat you if you are pregnant or think you are pregnant . Sexually transmitted diseases are not treated at the clinic.    Dental Care: Organization         Address  Phone  Notes  [facility] Department of [facility] [phone] Accepts children up to age 32 who are enrolled in [place] or [place]; pregnant women with a Medicaid card; and children who have applied for Medicaid or [place] Health Choice, but were declined, whose parents can pay a reduced fee at time of service.  [facility]  Department of [facility]  [address], [place] [phone] Accepts children up to age 26 who are enrolled in [place] or [place]; pregnant women with a Medicaid card; and children who have applied for Medicaid or [place] Health Choice, but were declined, whose parents can pay a reduced fee at time of service.  [place] Adult Dental Access PROGRAM  [place] [phone] Patients are seen by appointment only. Walk-ins are not accepted. [place] will see patients 80 years of age and older. Monday - Tuesday (8am-5pm) Most Wednesdays (8:30-5pm) $30 per visit, cash only  [facility] Adult Dental Access PROGRAM  [address], [facility] [phone] Patients are seen by appointment only. Walk-ins are not accepted. [place] will see patients 20 years of age and older. One  Wednesday Evening (Monthly: Volunteer Based).  $30 per visit, cash only  [place]  [phone] for adults; Children under age 72, call Graduate Pediatric Dentistry at [phone]. Children aged 27-14, please call [phone] to request a pediatric application.  Dental services are provided in all areas of dental care including fillings, crowns and bridges, complete and partial dentures, implants, gum treatment, root canals, and extractions. Preventive care is also provided. Treatment is provided to both adults and children. Patients are selected via a lottery and there is often a waiting list.   [facility] [address] [place]  [phone] [email or web address]   [facility] [address] [place], [place] [phone], Ext. [phone] Second and Fourth Thursday of each month, opens at 6:30 AM; Clinic ends at 9 AM.  Patients are seen on a first-come first-served basis, and a limited number are seen during each clinic.   [facility]  [address] [place] Danker [place], [place] [phone]    Eligibility Requirements You must have lived in [place], [place], or [place] counties for at least the last three months.   You cannot be eligible for state or federal sponsored [employer], including [employer], [place], or [employer].   You generally cannot be eligible for healthcare insurance through your employer.    How to apply: Eligibility screenings are held every Tuesday and Wednesday afternoon from 1:00 pm until 4:00 pm. You do not need an appointment for the interview!  [facility] [address] [place], [place]   [place]  [place] Department  [place]  [phone]    Behavioral Health Resources in the Community: Intensive Outpatient Programs Organization         Address  Phone  Notes  [place]. [address], [place], [place] [phone]   [facility]patient [address], [place], [place]   ADS: Alcohol [facility] Drug S[facility] [address], [place], [place]   [place] [address]  [place], [place] or [phone]   Substance Abuse Resources Organization         Address  Phone  Notes  Alcohol and Drug Services  [phone]   [place]  [phone]   [facility] [place]   Chinita Pester  [phone]   Residential & Outpatient Substance Abuse Program  [phone]   Psychological Services Organization         Address  Phone  Notes  [facility] [place]  [place]  [phone]   [place] [address] [place] or [phone]    Mobile Crisis Teams Organization         Address  Phone  Notes  [facility], Mobile Crisis Care Unit  [phone]   [facility]  [address]  [place], [place]   [place] [address], [place] [phone]    Self-Help/Support Groups Organization         Address  Phone             Notes  [place]. of [place] - variety of support groups  [place] Call for more information  Narcotics Anonymous (NA), [facility] [address] [place] [address], [employer]   2 meetings at this location  Residential Treatment Programs Organization         Address  Phone  Notes  [facility] [address],    [place]  [phone]   [facility]  [address] [place] [address], [place], [place]   [place], [place] [phone] Admissions: 8am-3pm M-F  Incentives Substance [place] [address]    [place], [place] [phone]   [facility] [address] [place], [place], [place]   [facility] [address]  [place], [place]   [facility] - Intensive Outpatient [address], [place], [place]   [facility] ([place] [address].,  [place], [place] [phone] or [phone]   [facility] Services (RTS) [address] [place], [place] Accepts Medicaid  [facility] [address],  [place] [phone] Substance Abuse/Addiction Treatment   [facility] Organization         Address  Phone  Notes  [facility]  [phone]   Domenic Schwab, PhD [address] Arlis Porta [place], [place]   [phone] or [phone]   [place], [place] [phone]   [facility] [address], [place], [place] [phone] Insurance/Medicaid/sponsorship through [facility] and Families [address], [address] [place]                                    [place], [place] [phone] [place] [address], [place] [phone]    Dr. Adele Schilder  [phone]   [facility] Dept. 1) [address], [place] 2) [place] 3)  [place] [address], [place] [phone]  [phone]   [place] [phone] or [phone] (After Hours)      Your gonorrhea and chlamydia culture is pending results, and you will receive a phone call in the next several days if it is positive.  However, you were treated empirically today with antibiotics for both gonorrhea and chlamydia.  Call your regular OB/GYN doctor tomorrow to schedule a follow up appointment within the next week.  Return to the Emergency Department immediately if worsening.

## 2013-11-09 ENCOUNTER — Telehealth: Payer: Self-pay | Admitting: *Deleted

## 2013-11-09 NOTE — Telephone Encounter (Signed)
[facility] Triage Call Report Triage Record Num: [id number] Operator: Abe People Patient Name: Melody [name] Call Date & Time: 11/08/2013 3:24:30PM Patient Phone: [phone] PCP: Walker Kehr Patient Gender: Female PCP Fax : [phone] Patient DOB: 1956/02/09 Practice Name: [facility] Reason for Call: Caller: [name]/Patient; PCP: Walker Kehr (Adults only); CB#: [phone]; Call regarding Vaginal discharge that started about 5-6 days ago. She is having UTI sx of frequency and discharge that is thick and dark green. She also is very fatigued. Triaged Urinary Symptoms - Female. and Vaginal Discharge or Irritation and needs to be seen in the ED for yes to disposition for comstant lower abdominal or pelvic pain lasting 3 hours or more. She wnat s to go to [employer]. Call back instructions given. Protocol(s) Used: Urinary Symptoms - Female Protocol(s) Used: Vaginal Discharge or Irritation Recommended Outcome per Protocol: See ED Immediately Reason for Outcome: Abnormal vaginal discharge (such as increased quantity, abnormal color, consistency, odor) Constant lower abdominal or pelvic pain lasting 3 hours or more Care Advice: ~ Another adult should drive. 01/

## 2013-11-12 ENCOUNTER — Telehealth (HOSPITAL_BASED_OUTPATIENT_CLINIC_OR_DEPARTMENT_OTHER): Payer: Self-pay

## 2013-11-12 LAB — GC/CHLAMYDIA PROBE AMP
CT Probe RNA: NEGATIVE
GC PROBE AMP APTIMA: NEGATIVE

## 2013-12-08 ENCOUNTER — Encounter: Payer: Self-pay | Admitting: Internal Medicine

## 2013-12-08 ENCOUNTER — Ambulatory Visit (INDEPENDENT_AMBULATORY_CARE_PROVIDER_SITE_OTHER): Payer: PRIVATE HEALTH INSURANCE | Admitting: Internal Medicine

## 2013-12-08 VITALS — BP 130/82 | HR 80 | Temp 97.6°F | Resp 16 | Wt 143.0 lb

## 2013-12-08 DIAGNOSIS — F411 Generalized anxiety disorder: Secondary | ICD-10-CM

## 2013-12-08 DIAGNOSIS — IMO0001 Reserved for inherently not codable concepts without codable children: Secondary | ICD-10-CM

## 2013-12-08 DIAGNOSIS — M545 Low back pain, unspecified: Secondary | ICD-10-CM

## 2013-12-08 DIAGNOSIS — E538 Deficiency of other specified B group vitamins: Secondary | ICD-10-CM

## 2013-12-08 MED ORDER — LANSOPRAZOLE 30 MG PO CPDR: 30.0000 mg | DELAYED_RELEASE_CAPSULE | Freq: Every morning | ORAL | Status: DC

## 2013-12-08 MED ORDER — BENZONATATE 200 MG PO CAPS: 200.0000 mg | ORAL_CAPSULE | Freq: Three times a day (TID) | ORAL | Status: DC | PRN

## 2013-12-08 MED ORDER — FLUTICASONE PROPIONATE 50 MCG/ACT NA SUSP: 2.0000 | Freq: Every day | NASAL | Status: DC

## 2013-12-08 MED ORDER — LEVOTHYROXINE SODIUM 50 MCG PO TABS: 50.0000 ug | ORAL_TABLET | Freq: Every day | ORAL | Status: DC

## 2013-12-08 MED ORDER — CYANOCOBALAMIN 1000 MCG/ML IJ SOLN: 1000.0000 ug | INTRAMUSCULAR | Status: DC

## 2013-12-08 MED ORDER — PIROXICAM 20 MG PO CAPS: 20.0000 mg | ORAL_CAPSULE | Freq: Every day | ORAL | Status: DC

## 2013-12-08 MED ORDER — PROAIR HFA 108 (90 BASE) MCG/ACT IN AERS: 1.0000 | INHALATION_SPRAY | Freq: Four times a day (QID) | RESPIRATORY_TRACT | Status: DC | PRN

## 2013-12-08 NOTE — Progress Notes (Signed)
   Subjective:     HPI  F/u allergies  F/u LBP - achy  The patient is here to follow up on chronic depression, anxiety, headaches and chronic moderate fibromyalgia symptoms partially controlled with medicines.  F/u fatigue - worse  Wt Readings from Last 3 Encounters:  12/08/13 143 lb (64.864 kg)  11/08/13 139 lb (63.05 kg)  09/02/13 136 lb (61.689 kg)    BP Readings from Last 3 Encounters:  12/08/13 130/82  11/08/13 118/71  09/02/13 110/70      Review of Systems  Constitutional: Positive for fatigue. Negative for activity change, appetite change and unexpected weight change.  HENT: Negative for congestion and mouth sores.   Eyes: Negative for visual disturbance.  Respiratory: Negative for chest tightness.   Genitourinary: Negative for frequency, difficulty urinating and vaginal pain.  Musculoskeletal: Positive for arthralgias. Negative for gait problem.  Skin: Negative for pallor.  Neurological: Negative for tremors.  Psychiatric/Behavioral: Positive for sleep disturbance. Negative for suicidal ideas and confusion. The patient is nervous/anxious.        Objective:   Physical Exam  Constitutional: She appears well-developed and well-nourished. No distress.  HENT:  Head: Normocephalic.  Right Ear: External ear normal.  Left Ear: External ear normal.  Nose: Nose normal.  Mouth/Throat: Oropharynx is clear and moist.  Eyes: Conjunctivae are normal. Pupils are equal, round, and reactive to [place]. Right eye exhibits no discharge. Left eye exhibits no discharge.  Neck: Normal range of motion. Neck supple. No JVD present. No tracheal deviation present. No thyromegaly present.  Cardiovascular: Normal rate, regular rhythm and normal heart sounds.   Pulmonary/Chest: No stridor. No respiratory distress. She has no wheezes.  Abdominal: Soft. Bowel sounds are normal. She exhibits no distension and no mass. There is no tenderness. There is no rebound and no guarding.   Musculoskeletal: She exhibits tenderness (many spots on back and neck are tender). She exhibits no edema.  Lymphadenopathy:    She has no cervical adenopathy.  Neurological: She displays normal reflexes. No cranial nerve deficit. She exhibits normal muscle tone. Coordination normal.  Skin: No rash noted. No erythema.  Psychiatric: She has a normal mood and affect. Her behavior is normal. Judgment and thought content normal.  sad    Lab Results  Component Value Date   WBC 5.9 03/18/2013   HGB 14.0 03/18/2013   HCT 40.8 03/18/2013   PLT 172.0 03/18/2013   GLUCOSE 72 03/18/2013   CHOL 174 05/29/2010   TRIG 38.0 05/29/2010   HDL 54.20 05/29/2010   LDLCALC 112* 05/29/2010   ALT 13 03/18/2013   AST 17 03/18/2013   NA 139 03/18/2013   K 4.2 03/18/2013   CL 103 03/18/2013   CREATININE 0.8 03/18/2013   BUN 17 03/18/2013   CO2 30 03/18/2013   TSH 3.84 03/18/2013          Assessment & Plan:

## 2013-12-08 NOTE — Patient Instructions (Signed)
For eczema:  Gluten free trial (no wheat products) for 4-6 weeks. OK to use gluten-free bread and gluten-free pasta.  Milk free trial (no milk, ice cream, cheese and yogurt) for 4-6 weeks. OK to use almond, coconut, rice or soy milk. "Almond breeze" brand tastes good.

## 2013-12-08 NOTE — Assessment & Plan Note (Signed)
Continue with current prescription therapy as reflected on the Med list.  

## 2013-12-08 NOTE — Progress Notes (Signed)
Pre visit review using our clinic review tool, if applicable. No additional management support is needed unless otherwise documented below in the visit note. 

## 2013-12-20 ENCOUNTER — Encounter (HOSPITAL_COMMUNITY): Payer: Self-pay | Admitting: Emergency Medicine

## 2013-12-20 ENCOUNTER — Emergency Department (HOSPITAL_COMMUNITY)
Admission: EM | Admit: 2013-12-20 | Discharge: 2013-12-20 | Disposition: A | Discharge status: Home / Self Care | Payer: PRIVATE HEALTH INSURANCE | Attending: Emergency Medicine | Admitting: Emergency Medicine

## 2013-12-20 ENCOUNTER — Emergency Department (HOSPITAL_COMMUNITY): Payer: PRIVATE HEALTH INSURANCE

## 2013-12-20 DIAGNOSIS — IMO0002 Reserved for concepts with insufficient information to code with codable children: Secondary | ICD-10-CM | POA: Insufficient documentation

## 2013-12-20 DIAGNOSIS — F3289 Other specified depressive episodes: Secondary | ICD-10-CM | POA: Insufficient documentation

## 2013-12-20 DIAGNOSIS — K219 Gastro-esophageal reflux disease without esophagitis: Secondary | ICD-10-CM | POA: Insufficient documentation

## 2013-12-20 DIAGNOSIS — Z87891 Personal history of nicotine dependence: Secondary | ICD-10-CM | POA: Insufficient documentation

## 2013-12-20 DIAGNOSIS — F411 Generalized anxiety disorder: Secondary | ICD-10-CM | POA: Insufficient documentation

## 2013-12-20 DIAGNOSIS — Z8739 Personal history of other diseases of the musculoskeletal system and connective tissue: Secondary | ICD-10-CM | POA: Insufficient documentation

## 2013-12-20 DIAGNOSIS — R112 Nausea with vomiting, unspecified: Secondary | ICD-10-CM | POA: Insufficient documentation

## 2013-12-20 DIAGNOSIS — R197 Diarrhea, unspecified: Secondary | ICD-10-CM | POA: Insufficient documentation

## 2013-12-20 DIAGNOSIS — Z79899 Other long term (current) drug therapy: Secondary | ICD-10-CM | POA: Insufficient documentation

## 2013-12-20 DIAGNOSIS — G8929 Other chronic pain: Secondary | ICD-10-CM | POA: Insufficient documentation

## 2013-12-20 DIAGNOSIS — F329 Major depressive disorder, single episode, unspecified: Secondary | ICD-10-CM | POA: Insufficient documentation

## 2013-12-20 DIAGNOSIS — E538 Deficiency of other specified B group vitamins: Secondary | ICD-10-CM | POA: Insufficient documentation

## 2013-12-20 LAB — CBC WITH DIFFERENTIAL/PLATELET
BASOS ABS: 0 10*3/uL (ref 0.0–0.1)
Basophils Relative: 0 % (ref 0–1)
Eosinophils Absolute: 0.1 10*3/uL (ref 0.0–0.7)
Eosinophils Relative: 1 % (ref 0–5)
HCT: 44.9 % (ref 36.0–46.0)
Hemoglobin: 15.4 g/dL — ABNORMAL HIGH (ref 12.0–15.0)
LYMPHS ABS: 0.3 10*3/uL — AB (ref 0.7–4.0)
Lymphocytes Relative: 3 % — ABNORMAL LOW (ref 12–46)
MCH: 29.6 pg (ref 26.0–34.0)
MCHC: 34.3 g/dL (ref 30.0–36.0)
MCV: 86.3 fL (ref 78.0–100.0)
Monocytes Absolute: 0.2 10*3/uL (ref 0.1–1.0)
Monocytes Relative: 3 % (ref 3–12)
NEUTROS PCT: 93 % — AB (ref 43–77)
Neutro Abs: 7 10*3/uL (ref 1.7–7.7)
PLATELETS: 126 10*3/uL — AB (ref 150–400)
RBC: 5.2 MIL/uL — AB (ref 3.87–5.11)
RDW: 12.6 % (ref 11.5–15.5)
WBC: 7.5 10*3/uL (ref 4.0–10.5)

## 2013-12-20 LAB — LIPASE, BLOOD: Lipase: 21 U/L (ref 11–59)

## 2013-12-20 LAB — URINALYSIS, ROUTINE W REFLEX MICROSCOPIC
BILIRUBIN URINE: NEGATIVE
Glucose, UA: NEGATIVE mg/dL
Ketones, ur: 15 mg/dL — AB
Leukocytes, UA: NEGATIVE
NITRITE: NEGATIVE
Protein, ur: NEGATIVE mg/dL
Specific Gravity, Urine: >1.03 — ABNORMAL HIGH (ref 1.005–1.030)
UROBILINOGEN UA: 0.2 mg/dL (ref 0.0–1.0)
pH: 5.5 (ref 5.0–8.0)

## 2013-12-20 LAB — COMPREHENSIVE METABOLIC PANEL
ALBUMIN: 4.1 g/dL (ref 3.5–5.2)
ALK PHOS: 77 U/L (ref 39–117)
ALT: 14 U/L (ref 0–35)
AST: 15 U/L (ref 0–37)
BUN: 24 mg/dL — ABNORMAL HIGH (ref 6–23)
CO2: 28 mEq/L (ref 19–32)
Calcium: 9.4 mg/dL (ref 8.4–10.5)
Chloride: 99 mEq/L (ref 96–112)
Creatinine, Ser: 0.74 mg/dL (ref 0.50–1.10)
GFR calc Af Amer: >90 mL/min (ref >90)
GFR calc non Af Amer: >90 mL/min (ref >90)
Glucose, Bld: 109 mg/dL — ABNORMAL HIGH (ref 70–99)
POTASSIUM: 3.9 meq/L (ref 3.7–5.3)
Sodium: 137 mEq/L (ref 137–147)
Total Bilirubin: 1.5 mg/dL — ABNORMAL HIGH (ref 0.3–1.2)
Total Protein: 7.3 g/dL (ref 6.0–8.3)

## 2013-12-20 LAB — URINE MICROSCOPIC-ADD ON

## 2013-12-20 MED ORDER — SODIUM CHLORIDE 0.9 % IV BOLUS (SEPSIS): 500.0000 mL | Freq: Once | INTRAVENOUS | Status: DC

## 2013-12-20 MED ORDER — MORPHINE SULFATE 4 MG/ML IJ SOLN
4.0000 mg | INTRAMUSCULAR | Status: DC | PRN
  Administered 2013-12-20: 4 mg via INTRAVENOUS
  Filled 2013-12-20: qty 1

## 2013-12-20 MED ORDER — PROMETHAZINE HCL 25 MG PO TABS: 25.0000 mg | ORAL_TABLET | Freq: Four times a day (QID) | ORAL | Status: DC | PRN

## 2013-12-20 MED ORDER — ONDANSETRON HCL 4 MG/2ML IJ SOLN
4.0000 mg | Freq: Once | INTRAMUSCULAR | Status: AC
Start: 2013-12-20 — End: 2013-12-20
  Administered 2013-12-20: 4 mg via INTRAMUSCULAR

## 2013-12-20 MED ORDER — SODIUM CHLORIDE 0.9 % IV BOLUS (SEPSIS)
1000.0000 mL | Freq: Once | INTRAVENOUS | Status: AC
  Administered 2013-12-20: 1000 mL via INTRAVENOUS

## 2013-12-20 MED ORDER — SODIUM CHLORIDE 0.9 % IV SOLN: INTRAVENOUS | Status: DC

## 2013-12-20 MED ORDER — ONDANSETRON HCL 4 MG/2ML IJ SOLN
INTRAMUSCULAR | Status: AC
  Filled 2013-12-20: qty 2

## 2013-12-20 MED ORDER — PROMETHAZINE HCL 25 MG/ML IJ SOLN
12.5000 mg | Freq: Once | INTRAMUSCULAR | Status: AC
  Administered 2013-12-20: 12.5 mg via INTRAVENOUS
  Filled 2013-12-20: qty 1

## 2013-12-20 NOTE — ED Notes (Signed)
Patient ambulatory to restroom. After voiding, patient stood up and became nauseous and vomited x 1. Returned to room.

## 2013-12-20 NOTE — ED Provider Notes (Signed)
CSN: [id number]     Arrival date & time 12/20/13  1456 History   First MD Initiated Contact with Patient 12/20/13 1544     Chief Complaint  Patient presents with  . Emesis  . Diarrhea     HPI Pt was seen at 1600.  Per pt, c/o gradual onset and persistence of multiple intermittent episodes of N/V/D that began this morning.   Describes the stools as "watery." States she has been unable to take her usual medications, and now has an acute flair of her chronic fibromyalgia "pains all over."  Denies abd pain, no CP/SOB, no back pain, no fevers, no black or blood in stools or emesis.     Past Medical History  Diagnosis Date  . Depression     bipolar- Dr [name]  . Anxiety   . Low back pain     Dr Arnoldo Morale  . GERD (gastroesophageal reflux disease)   . Vitamin B12 deficiency   . Sinus congestion   . Vertigo   . Fibromyalgia   . Headache   . Chronic back pain   . Fatigue   . Chronic neck pain    Past Surgical History  Procedure Laterality Date  . Cervical laminectomy  2001 and 2995    Dr Arnoldo Morale   Family History  Problem Relation Age of Onset  . Arthritis Other    History  Substance Use Topics  . Smoking status: Former Smoker    Quit date: 05/06/2011  . Smokeless tobacco: Not on file  . Alcohol Use: No    Review of Systems ROS: Statement: All systems negative except as marked or noted in the HPI; Constitutional: Negative for fever and chills. +"pains all over." ; ; Eyes: Negative for eye pain, redness and discharge. ; ; ENMT: Negative for ear pain, hoarseness, nasal congestion, sinus pressure and sore throat. ; ; Cardiovascular: Negative for chest pain, palpitations, diaphoresis, dyspnea and peripheral edema. ; ; Respiratory: Negative for cough, wheezing and stridor. ; ; Gastrointestinal: +N/V/D. Negative for abdominal pain, blood in stool, hematemesis, jaundice and rectal bleeding. . ; ; Genitourinary: Negative for dysuria, flank pain and hematuria. ; ; Musculoskeletal: Negative  for back pain and neck pain. Negative for swelling and trauma.; ; Skin: Negative for pruritus, rash, abrasions, blisters, bruising and skin lesion.; ; Neuro: Negative for headache, lightheadedness and neck stiffness. Negative for weakness, altered level of consciousness , altered mental status, extremity weakness, paresthesias, involuntary movement, seizure and syncope.       Allergies  Doxycycline hyclate; Nabumetone; Sulfa antibiotics; Tetracycline hcl; and Tramadol hcl  Home Medications   Current Outpatient Rx  Name  Route  Sig  Dispense  Refill  . albuterol (PROVENTIL HFA;VENTOLIN HFA) 108 (90 BASE) MCG/ACT inhaler   Inhalation   Inhale 2 puffs into the lungs every 6 (six) hours as needed for wheezing or shortness of breath.         . ALPRAZolam (XANAX) 1 MG tablet   Oral   Take 1 mg by mouth 3 (three) times daily as needed for anxiety (*Make take an additional tablet as needed).          Marland Kitchen amphetamine-dextroamphetamine (ADDERALL) 30 MG tablet   Oral   Take 30 mg by mouth every morning.         . cetirizine (ZYRTEC) 10 MG tablet   Oral   Take 10 mg by mouth daily.         . cyanocobalamin (,VITAMIN B-12,) 1000 MCG/ML injection  Intramuscular   Inject 1 mL (1,000 mcg total) into the muscle every 14 (fourteen) days.   10 mL   6   . fluticasone (FLONASE) 50 MCG/ACT nasal spray   Each Nare   Place 2 sprays into both nostrils daily.   16 g   6   . lansoprazole (PREVACID) 30 MG capsule   Oral   Take 1 capsule (30 mg total) by mouth every morning.   30 capsule   11   . levothyroxine (SYNTHROID, LEVOTHROID) 50 MCG tablet   Oral   Take 1 tablet (50 mcg total) by mouth daily before breakfast.   30 tablet   11   . piroxicam (FELDENE) 20 MG capsule   Oral   Take 20 mg by mouth daily as needed. With food         . pseudoephedrine-acetaminophen (TYLENOL SINUS) 30-500 MG TABS   Oral   Take 1 tablet by mouth every 4 (four) hours as needed.         Marland Kitchen  QUEtiapine (SEROQUEL) 25 MG tablet   Oral   Take 12.5 mg by mouth at bedtime.          . sennosides-docusate sodium (SENOKOT-S) 8.6-50 MG tablet   Oral   Take 1 tablet by mouth daily as needed for constipation.          . triamcinolone cream (KENALOG) 0.5 %   Topical   Apply 1 application topically 2 (two) times daily as needed.         . eletriptan (RELPAX) 20 MG tablet   Oral   One tablet by mouth as needed for migraine headache.  If the headache improves and then returns, dose may be repeated after 2 hours have elapsed since first dose (do not exceed 80 mg per day). may repeat in 2 hours if necessary          . EXPIRED: polyethylene glycol powder (GLYCOLAX/MIRALAX) powder   Oral   Take 17 g by mouth daily. For constipation          BP 102/65  Pulse 98  Temp(Src) 98.5 F (36.9 C) (Oral)  Resp 20  Ht 5\' 6"  (1.676 m)  Wt 140 lb (63.504 kg)  BMI 22.61 kg/m2  SpO2 97% Physical Exam 1605: Physical examination:  Nursing notes reviewed; Vital signs and O2 SAT reviewed;  Constitutional: Well developed, Well nourished, Well hydrated, In no acute distress; Head:  Normocephalic, atraumatic; Eyes: EOMI, PERRL, No scleral icterus; ENMT: Mouth and pharynx normal, Mucous membranes moist; Neck: Supple, Full range of motion, No lymphadenopathy; Cardiovascular: Regular rate and rhythm, No gallop; Respiratory: Breath sounds clear & equal bilaterally, No wheezes.  Speaking full sentences with ease, Normal respiratory effort/excursion; Chest: Nontender, Movement normal; Abdomen: Soft, Nontender, Nondistended, Normal bowel sounds; Genitourinary: No CVA tenderness; Extremities: Pulses normal, No tenderness, No edema, No calf edema or asymmetry.; Neuro: AA&Ox3, Major CN grossly intact.  Speech clear. No gross focal motor or sensory deficits in extremities.; Skin: Color normal, Warm, Dry.; Psych:  Anxious and tearful.    ED Course  Procedures     EKG Interpretation   None       MDM   MDM Reviewed: previous chart, nursing note and vitals Reviewed previous: labs Interpretation: labs and x-ray    Results for orders placed during the hospital encounter of 12/20/13  URINALYSIS, ROUTINE W REFLEX MICROSCOPIC      Result Value Ref Range   Color, Urine YELLOW  YELLOW  APPearance CLEAR  CLEAR   Specific Gravity, Urine >1.030 (*) 1.005 - 1.030   pH 5.5  5.0 - 8.0   Glucose, UA NEGATIVE  NEGATIVE mg/dL   Hgb urine dipstick TRACE (*) NEGATIVE   Bilirubin Urine NEGATIVE  NEGATIVE   Ketones, ur 15 (*) NEGATIVE mg/dL   Protein, ur NEGATIVE  NEGATIVE mg/dL   Urobilinogen, UA 0.2  0.0 - 1.0 mg/dL   Nitrite NEGATIVE  NEGATIVE   Leukocytes, UA NEGATIVE  NEGATIVE  CBC WITH DIFFERENTIAL      Result Value Ref Range   WBC 7.5  4.0 - 10.5 K/uL   RBC 5.20 (*) 3.87 - 5.11 MIL/uL   Hemoglobin 15.4 (*) 12.0 - 15.0 g/dL   HCT 44.9  36.0 - 46.0 %   MCV 86.3  78.0 - 100.0 fL   MCH 29.6  26.0 - 34.0 pg   MCHC 34.3  30.0 - 36.0 g/dL   RDW 12.6  11.5 - 15.5 %   Platelets 126 (*) 150 - 400 K/uL   Neutrophils Relative % 93 (*) 43 - 77 %   Neutro Abs 7.0  1.7 - 7.7 K/uL   Lymphocytes Relative 3 (*) 12 - 46 %   Lymphs Abs 0.3 (*) 0.7 - 4.0 K/uL   Monocytes Relative 3  3 - 12 %   Monocytes Absolute 0.2  0.1 - 1.0 K/uL   Eosinophils Relative 1  0 - 5 %   Eosinophils Absolute 0.1  0.0 - 0.7 K/uL   Basophils Relative 0  0 - 1 %   Basophils Absolute 0.0  0.0 - 0.1 K/uL  COMPREHENSIVE METABOLIC PANEL      Result Value Ref Range   Sodium 137  137 - 147 mEq/L   Potassium 3.9  3.7 - 5.3 mEq/L   Chloride 99  96 - 112 mEq/L   CO2 28  19 - 32 mEq/L   Glucose, Bld 109 (*) 70 - 99 mg/dL   BUN 24 (*) 6 - 23 mg/dL   Creatinine, Ser 0.74  0.50 - 1.10 mg/dL   Calcium 9.4  8.4 - 10.5 mg/dL   Total Protein 7.3  6.0 - 8.3 g/dL   Albumin 4.1  3.5 - 5.2 g/dL   AST 15  0 - 37 U/L   ALT 14  0 - 35 U/L   Alkaline Phosphatase 77  39 - 117 U/L   Total Bilirubin 1.5 (*) 0.3 - 1.2 mg/dL   GFR calc non  Af Amer >90  >90 mL/min   GFR calc Af Amer >90  >90 mL/min  LIPASE, BLOOD      Result Value Ref Range   Lipase 21  11 - 59 U/L  URINE MICROSCOPIC-ADD ON      Result Value Ref Range   Squamous Epithelial / LPF RARE  RARE   RBC / HPF 0-2  <3 RBC/hpf   Dg Abd Acute W/chest 12/20/2013   CLINICAL DATA:  Vomiting and diarrhea.  Weakness.  EXAM: ACUTE ABDOMEN SERIES (ABDOMEN 2 VIEW & CHEST 1 VIEW)  COMPARISON:  Chest x-ray dated 04/29/2007 and scout image for CT scan of the abdomen dated 01/30/2011  FINDINGS: Heart and lungs appear normal. No free air in the abdomen. No osseous abnormality. There is minimal air scattered throughout nondistended bowel. No significant osseous abnormality.  IMPRESSION: Benign appearing abdomen and chest.   Electronically Signed   By: Rozetta Nunnery M.D.   On: 12/20/2013 16:58    1945:  Pt has tol PO well while in the ED without N/V.  No stooling while in the ED.  Abd remains benign, VSS. Pt is texting on her cellphone and watching TV. States she feels better and wants to go home now. Will continue to tx symptomatically at this time. Dx and testing d/w pt and family.  Questions answered.  Verb understanding, agreeable to d/c home with outpt f/u.   Alfonzo Feller, DO 12/23/13 [phone]

## 2013-12-20 NOTE — ED Notes (Signed)
Patient with no complaints at this time. Respirations even and unlabored. Skin warm/dry. Discharge instructions reviewed with patient at this time. Patient given opportunity to voice concerns/ask questions. Patient discharged at this time and left Emergency Department with steady gait.   

## 2013-12-20 NOTE — Discharge Instructions (Signed)
°Emergency Department Resource Guide °1) Find a Doctor and Pay Out of Pocket °Although you won't have to find out who is covered by your insurance plan, it is a good idea to ask around and get recommendations. You will then need to call the office and see if the doctor you have chosen will accept you as a new patient and what types of options they offer for patients who are self-pay. Some doctors offer discounts or will set up payment plans for their patients who do not have insurance, but you will need to ask so you aren't surprised when you get to your appointment. ° °2) Contact Your Local Health Department °Not all health departments have doctors that can see patients for sick visits, but many do, so it is worth a call to see if yours does. If you don't know where your local health department is, you can check in your phone book. The CDC also has a tool to help you locate your state's health department, and many state websites also have listings of all of their local health departments. ° °3) Find a Walk-in Clinic °If your illness is not likely to be very severe or complicated, you may want to try a walk in clinic. These are popping up all over the country in pharmacies, drugstores, and shopping centers. They're usually staffed by nurse practitioners or physician assistants that have been trained to treat common illnesses and complaints. They're usually fairly quick and inexpensive. However, if you have serious medical issues or chronic medical problems, these are probably not your best option. ° °No Primary Care Doctor: °- Call [facility] at  832-8000 - they can help you locate a primary care doctor that  accepts your insurance, provides certain services, etc. °- Physician Referral Service- 1-800-533-3463 ° °Chronic Pain Problems: °Organization         Address  Phone   Notes  °[facility]  (336) 297-2271 Patients need to be referred by their primary care doctor.  ° °Medication  Assistance: °Organization         Address  Phone   Notes  °[place] [facility] [address] °[place], [place] [address] (336) 641-8030 --Must be a resident of [place] °-- Must have NO insurance coverage whatsoever (no Medicaid/ Medicare, etc.) °-- The pt. MUST have a primary care doctor that directs their care regularly and follows them in the community °  °[facility]  (866) 331-1348   °[employer]  (888) 892-1162   ° °Agencies that provide inexpensive medical care: °Organization         Address  Phone   Notes  °[facility]  (336) 832-8035   ° Internal Medicine    (336) 832-7272   °[facility] [address] °[place], [place] [address] (336) 832-4777   °[facility] [address], °[place] (336) 271-4999   °[facility]    (336) 373-0678   °[facility]    (336) 272-1050   °[facility] ° [address], [place] Phone:  (336) 832-4444, Fax:  (336) 832-4440 Hours of Operation:  9 am - 6 pm, M-F.  Also accepts Medicaid/Medicare and self-pay.  °[facility] ° [address], [place] Phone: (336) [phone], Fax: (336) [phone]. Hours of Operation:  8:30 am - 5:30 pm, M-F.  Also accepts Medicaid and self-pay.  °[facility] [place] [address], [place] Phone: (336) 878-6027   °[facility] [address], [place], [place] (336)723-1848, Ext. 123 Mondays & Thursdays: 7-9 AM.  First 15 patients are seen on a first come, first serve basis. °  ° °Medicaid-accepting [place] Providers: ° °Organization         Address  Phone   Notes  °[facility] [address], [place] (336) 641-2100 Also accepts self-pay patients.  °[facility] [address], [place] ° (336) 856-9996   °[facility] [address], [place]  (336) 288-8857   °[facility] [address] [place] [address], [place] (336) 299-7000   °Veita Bland [address], [place]  ° (336) 373-1557 Only accepts [place] Access Medicaid patients after they have their name applied to their card.  ° °Self-Pay (no insurance) in [place]: ° °Organization         Address  Phone   Notes  °Sickle Cell Patients, [facility] [address], [place] (336) 832-1970   °[facility] [address], [place] (336) 832-4400   °[facility] [place] ° [address], [place] (336) 992-4800   °[facility]/Dr. Osei-Bonsu ° [address] [place] [address], [place] or [address], [place] (336) [phone] Phone number for both [place] and [place] locations is the same.  °[facility] [address], [place] (336) 299-0000   °[facility] [address] [place] [address], [place] or [address] (336) 852-7530 °(336) 878-2260   °[facility] [address], [place] (336) 350-1642, phone; (336) 294-5005, fax Sees patients 1st and 3rd Saturday of every month.  Must not qualify for public or private insurance (i.e. Medicaid, Medicare, [place] Health Choice, Veterans' Benefits) • Household income should be no more than 200% of the poverty level •The clinic cannot treat you if you are pregnant or think you are pregnant • Sexually transmitted diseases are not treated at the clinic.  ° ° °Dental Care: °Organization         Address  Phone  Notes  °[place] [facility] [address],  (336) 641-6152 Accepts children up to age 21 who are enrolled in Medicaid or [place] Health Choice; pregnant women with a Medicaid card; and children who have applied for Medicaid or [place] Health Choice, but were declined, whose parents can pay a reduced fee at time of service.  °[place]  [facility] [place]  [address], [place] (336) 641-7733 Accepts children up to age 21 who are enrolled in Medicaid or [place] Health Choice; pregnant women with a Medicaid card; and children who have applied for Medicaid or  Health Choice, but were declined, whose parents can pay a reduced fee at time of service.  °[facility] ° [address],  (336) 641-4533 Patients are seen by appointment only. Walk-ins are not accepted. [facility] will see patients 18 years of age and older. °Monday - Tuesday (8am-5pm) °Most Wednesdays (8:30-5pm) °$30 per visit, cash only  °[facility] ° [address], [place] (336) 641-4533 Patients are seen by appointment only. Walk-ins are not accepted. [facility] will see patients 18 years of age and older. °One   Wednesday Evening (Monthly: Volunteer Based).  $30 per visit, cash only  °[facility]  (919) 537-3737 for adults; Children under age 4, call Graduate Pediatric Dentistry at ([phone]) [phone]. Children aged 4-14, please call ([phone]) [phone] to request a pediatric application. ° Dental services are provided in all areas of dental care including fillings, crowns and bridges, complete and partial dentures, implants, gum treatment, root canals, and extractions. Preventive care is also provided. Treatment is provided to both adults and children. °Patients are selected via a lottery and there is often a waiting list. °  °[facility] [address], °[place] ° (336) 763-8833 [email or web address] °  °[facility] [address], [place], [place] (336)723-1848, Ext. 123 Second and Fourth Thursday of each month, opens at 6:30 AM; Clinic ends at 9 AM.  Patients are seen on a first-come first-served basis, and a limited number are seen during each clinic.  ° °[facility] ° [address], [place], [place] (336) 723-7904    Eligibility Requirements °You must have lived in [place], [place], or [place] counties for at least the last three months. °  You cannot be eligible for state or federal sponsored healthcare insurance, including Veterans Administration, Medicaid, or Medicare. °  You generally cannot be eligible for healthcare insurance through your employer.  °  How to apply: °Eligibility screenings are held every Tuesday and Wednesday afternoon from 1:00 pm until 4:00 pm. You do not need an appointment for the interview!  °[facility] [address], [place], [place] 336-631-2330   °[facility]  336-342-8273   °[facility]  336-703-3100   °[facility]  336-570-6415   ° °Behavioral Health Resources in the Community: °Intensive Outpatient Programs °Organization         Address  Phone  Notes  °[facility] [address], [place], [place] 336-878-6098   °[facility] [address], [place], [place] 336-832-9800   °[facility] [address], [place], [place] ° 336-882-2125   °[facility] [address],  °[place], [place] 1-800-853-5163 or 336-641-4981   °Substance Abuse Resources °Organization         Address  Phone  Notes  °[facility]  336-882-2125   °[facility]  336-784-9470   °[facility]  336-285-9073   °[facility]  336-845-3988   °Residential & Outpatient Substance Abuse Program  1-800-659-3381   °Psychological Services °Organization         Address  Phone  Notes  ° Health  336- 832-9600   °[facility]  336- 378-7881   °[facility] [address], [place] 1-800-853-5163 or 336-641-4981   ° °Mobile Crisis Teams °Organization         Address  Phone  Notes  °[facility], Mobile Crisis Care Unit  1-877-626-1772   °Assertive °[facility] ° [address]  [place], [place] 336-834-9664   °Av[facility] DeEsch [address] °[place] 336-554-5454   ° °Self-Help/Support Groups °Organization         Address  Phone             Notes  °[facility] [place] - variety of support groups  336- 373-1402 Call for more information  °Narcotics Anonymous (NA), [facility] [address], °[place]  2 meetings at this location  ° °  Residential Treatment Programs Organization         Address  Phone  Notes  [facility] [address],    [place]  1-(947)048-7226   [facility]  [address] [place] [address], [place], [place]   [place], [place] [phone] Admissions: 8am-3pm M-F  [facility] [place] [address],    [place], [place] [phone]   [facility] [address] [place], [place], [place]   [facility] [address]  [place], [place]   [facility] Intensive Outpatient [address], [place], [place]   [facility]  [place], [place] 1-306-146-7053 or 9012783360   [facility] [address], [place], [place] Accepts Medicaid  [facility] [address],  [place] 1-414 643 8358 Substance Abuse/Addiction Treatment   [facility] Organization         Address  Phone  Notes  [facility]  [phone]   Domenic Schwab, PhD [address] [place], [place]   (414) 263-2423 or (386)854-6453   [facility] [place], [place] [phone]   [facility] [address], [place], [place] [phone] Insurance/Medicaid/sponsorship through [facility] [address]                                    [place], [place] [phone] [place] [address], [place] [phone]    Dr. Adele Schilder  ([phone]) [phone]   [facility] Dept. 1) [address], [place] 2) [place] 3)  [address], [place] ([phone]  [phone]   [place] [phone] or [phone] (After Hours)       Take the prescription as directed.  Increase your fluid intake (ie:  Gatoraide) for the next few days, as discussed.  Eat a bland diet and advance to your regular diet slowly as you can tolerate it.   Avoid full strength juices, as well as milk and milk products until your diarrhea has resolved.   Call your regular medical doctor Monday to schedule a follow up appointment in the next 2 days.  Return to the Emergency Department immediately if not improving (or even worsening) despite taking the medicines as prescribed, any black or bloody stool or vomit, if you develop a fever over "101," or for any other concerns.

## 2013-12-20 NOTE — ED Notes (Signed)
Pt c/o n/v/d since this am.  C/O pain in abd, back, and legs. Also reports headache.

## 2013-12-22 LAB — URINE CULTURE: Colony Count: 10000

## 2013-12-23 ENCOUNTER — Telehealth: Payer: Self-pay | Admitting: *Deleted

## 2013-12-23 NOTE — Telephone Encounter (Signed)
Pt left vm stating she was recently at [facility] with bone pain that is the worst pain she has ever felt. She states they gave her morphine while there. She is requesting a Rx for that or something similar and phenergan. Please advise.

## 2013-12-24 NOTE — ED Notes (Signed)
+   Urine No treatment needed per Irena Cords

## 2013-12-25 MED ORDER — PROMETHAZINE HCL 25 MG PO TABS: 25.0000 mg | ORAL_TABLET | Freq: Four times a day (QID) | ORAL | Status: DC | PRN

## 2013-12-25 NOTE — Telephone Encounter (Signed)
Ok Phenergan. We will discuss pain meds at he next OV Thx

## 2013-12-25 NOTE — Telephone Encounter (Signed)
Left detailed mess informing pt of below.  

## 2014-01-12 ENCOUNTER — Telehealth: Payer: Self-pay | Admitting: *Deleted

## 2014-01-12 DIAGNOSIS — M79676 Pain in unspecified toe(s): Secondary | ICD-10-CM

## 2014-01-12 NOTE — Telephone Encounter (Signed)
OK Amoxicillin 875 mg po bid x 10 d (#20) no ref Thx

## 2014-01-12 NOTE — Telephone Encounter (Signed)
Pt called requesting a referral for her left foot Great Toe, states it is worsening. Please advise

## 2014-01-12 NOTE — Telephone Encounter (Signed)
Pt left vm c/o sinus drainage and dizziness. She is requesting Rx for Amoxicillin. Please advise.

## 2014-01-12 NOTE — Telephone Encounter (Signed)
Ok Thx 

## 2014-01-12 NOTE — Telephone Encounter (Signed)
Left detailed message on pts VM referral placed.

## 2014-01-14 ENCOUNTER — Encounter: Payer: Self-pay | Admitting: Podiatry

## 2014-01-14 ENCOUNTER — Ambulatory Visit (INDEPENDENT_AMBULATORY_CARE_PROVIDER_SITE_OTHER): Payer: Medicare Other | Admitting: Podiatry

## 2014-01-14 VITALS — BP 118/84 | HR 95 | Resp 16 | Ht 66.0 in | Wt 140.0 lb

## 2014-01-14 DIAGNOSIS — L600 Ingrowing nail: Secondary | ICD-10-CM

## 2014-01-14 MED ORDER — AMOXICILLIN 875 MG PO TABS: 875.0000 mg | ORAL_TABLET | Freq: Two times a day (BID) | ORAL | Status: DC

## 2014-01-14 MED ORDER — HYDROCODONE-ACETAMINOPHEN 10-325 MG PO TABS: 1.0000 | ORAL_TABLET | Freq: Three times a day (TID) | ORAL | Status: DC | PRN

## 2014-01-14 NOTE — Telephone Encounter (Signed)
abx sent to pharmacy. Pt made aware.

## 2014-01-14 NOTE — Progress Notes (Signed)
Subjective:     Patient ID: Melody [name], female   DOB: 16-Feb-1956, 58 y.o.   MRN: [id number]  Toe Pain    patient presents stating this big toenail is very sore on my left foot and I cannot take care of it myself trimming or soaking. States it's been there for around a month   Review of Systems  All other systems reviewed and are negative.       Objective:   Physical Exam  Nursing note and vitals reviewed. Constitutional: She is oriented to person, place, and time.  Cardiovascular: Intact distal pulses.   Musculoskeletal: Normal range of motion.  Neurological: She is oriented to person, place, and time.  Skin: Skin is warm.   neurovascular status intact with muscle strength adequate range of motion within normal limits and no equinus condition noted. Fill time is noted to be immediate to all digits and feet are well perfused within incurvated left hallux lateral border did very tender when pressed with irritation at the distal surface     Assessment:     Ingrown toenail deformity left hallux lateral border did very painful when pressed    Plan:     H&P reviewed and conditions discussed. I have recommended removal of corner and explained to her risks associated with nail surgery and she is willing to accept risk at this time. Infiltrated the left hallux 60 mg Xylocaine Marcaine mixture removed the lateral border exposed matrix and applied phenol 3 applications 30 seconds followed by alcohol lavaged and sterile dressing. Instructed on soaks and reappoint. Also placed on Vicodin for postoperative pain as it occurs

## 2014-01-14 NOTE — Progress Notes (Signed)
   Subjective:    Patient ID: Melody [name], female    DOB: 08-01-1956, 58 y.o.   MRN: [id number]  HPI Comments: "I have an ingrown"  Patient c/o throbbing 1st toe left, lateral border, for 1 month. The area is red and swollen. She has been soaking it. No help.      Review of Systems  Constitutional: Positive for fatigue and unexpected weight change.  HENT: Positive for sinus pressure.   Respiratory: Positive for cough.   Cardiovascular:       Calf pain with walking  Musculoskeletal: Positive for back pain and gait problem.  Skin:       Change in nail   Psychiatric/Behavioral: The patient is nervous/anxious.   All other systems reviewed and are negative.       Objective:   Physical Exam        Assessment & Plan:

## 2014-01-14 NOTE — Patient Instructions (Signed)

## 2014-02-02 ENCOUNTER — Ambulatory Visit: Payer: Self-pay

## 2014-02-10 ENCOUNTER — Other Ambulatory Visit: Payer: Self-pay | Admitting: Internal Medicine

## 2014-02-10 NOTE — Telephone Encounter (Signed)
pls call in Prom - cod syr prn. OV if sick Thx

## 2014-02-10 NOTE — Telephone Encounter (Signed)
Pt states Tessalon pearls are not covered. She is requesting something else. Please advise

## 2014-03-09 ENCOUNTER — Encounter: Payer: Self-pay | Admitting: Internal Medicine

## 2014-03-09 ENCOUNTER — Ambulatory Visit (INDEPENDENT_AMBULATORY_CARE_PROVIDER_SITE_OTHER): Payer: PRIVATE HEALTH INSURANCE | Admitting: Internal Medicine

## 2014-03-09 VITALS — BP 104/70 | Temp 98.9°F | Wt 138.0 lb

## 2014-03-09 DIAGNOSIS — F411 Generalized anxiety disorder: Secondary | ICD-10-CM

## 2014-03-09 DIAGNOSIS — M545 Low back pain, unspecified: Secondary | ICD-10-CM

## 2014-03-09 DIAGNOSIS — F319 Bipolar disorder, unspecified: Secondary | ICD-10-CM

## 2014-03-09 DIAGNOSIS — IMO0001 Reserved for inherently not codable concepts without codable children: Secondary | ICD-10-CM

## 2014-03-09 DIAGNOSIS — E538 Deficiency of other specified B group vitamins: Secondary | ICD-10-CM

## 2014-03-09 DIAGNOSIS — E039 Hypothyroidism, unspecified: Secondary | ICD-10-CM

## 2014-03-09 DIAGNOSIS — E559 Vitamin D deficiency, unspecified: Secondary | ICD-10-CM

## 2014-03-09 MED ORDER — METHYLPREDNISOLONE ACETATE 80 MG/ML IJ SUSP
80.0000 mg | Freq: Once | INTRAMUSCULAR | Status: AC
  Administered 2014-03-09: 80 mg via INTRAMUSCULAR

## 2014-03-09 MED ORDER — HYDROCODONE-ACETAMINOPHEN 7.5-325 MG PO TABS: 0.5000 | ORAL_TABLET | Freq: Three times a day (TID) | ORAL | Status: DC | PRN

## 2014-03-09 MED ORDER — CYANOCOBALAMIN 1000 MCG/ML IJ SOLN: 1000.0000 ug | INTRAMUSCULAR | Status: DC

## 2014-03-09 MED ORDER — TRIAMCINOLONE ACETONIDE 0.5 % EX CREA: 1.0000 "application " | TOPICAL_CREAM | Freq: Three times a day (TID) | CUTANEOUS | Status: DC

## 2014-03-09 MED ORDER — LEVOTHYROXINE SODIUM 50 MCG PO TABS: 50.0000 ug | ORAL_TABLET | Freq: Every day | ORAL | Status: DC

## 2014-03-09 NOTE — Assessment & Plan Note (Signed)
Continue with current prescription therapy as reflected on the Med list.  

## 2014-03-09 NOTE — Progress Notes (Signed)
Pre visit review using our clinic review tool, if applicable. No additional management support is needed unless otherwise documented below in the visit note. 

## 2014-03-09 NOTE — Progress Notes (Signed)
   Subjective:     HPI  F/u allergies  F/u LBP - achy - worse on R  The patient is here to follow up on chronic depression, anxiety, headaches and chronic moderate fibromyalgia symptoms notcontrolled with medicines; all joints hurt.  F/u fatigue - worse  Wt Readings from Last 3 Encounters:  03/09/14 138 lb (62.596 kg)  01/14/14 140 lb (63.504 kg)  12/20/13 140 lb (63.504 kg)    BP Readings from Last 3 Encounters:  03/09/14 104/70  01/14/14 118/84  12/20/13 111/60      Review of Systems  Constitutional: Positive for fatigue. Negative for activity change, appetite change and unexpected weight change.  HENT: Negative for congestion and mouth sores.   Eyes: Negative for visual disturbance.  Respiratory: Negative for chest tightness.   Genitourinary: Negative for frequency, difficulty urinating and vaginal pain.  Musculoskeletal: Positive for arthralgias. Negative for gait problem.  Skin: Negative for pallor.  Neurological: Negative for tremors.  Psychiatric/Behavioral: Positive for sleep disturbance. Negative for suicidal ideas and confusion. The patient is nervous/anxious.        Objective:   Physical Exam  Constitutional: She appears well-developed and well-nourished. No distress.  HENT:  Head: Normocephalic.  Right Ear: External ear normal.  Left Ear: External ear normal.  Nose: Nose normal.  Mouth/Throat: Oropharynx is clear and moist.  Eyes: Conjunctivae are normal. Pupils are equal, round, and reactive to Mcvicker. Right eye exhibits no discharge. Left eye exhibits no discharge.  Neck: Normal range of motion. Neck supple. No JVD present. No tracheal deviation present. No thyromegaly present.  Cardiovascular: Normal rate, regular rhythm and normal heart sounds.   Pulmonary/Chest: No stridor. No respiratory distress. She has no wheezes.  Abdominal: Soft. Bowel sounds are normal. She exhibits no distension and no mass. There is no tenderness. There is no rebound and  no guarding.  Musculoskeletal: She exhibits tenderness (many spots on back and neck are tender). She exhibits no edema.  Lymphadenopathy:    She has no cervical adenopathy.  Neurological: She displays normal reflexes. No cranial nerve deficit. She exhibits normal muscle tone. Coordination normal.  Skin: No rash noted. No erythema.  Psychiatric: She has a normal mood and affect. Her behavior is normal. Judgment and thought content normal.  sad  Achy w/ROM - all joints   Lab Results  Component Value Date   WBC 7.5 12/20/2013   HGB 15.4* 12/20/2013   HCT 44.9 12/20/2013   PLT 126* 12/20/2013   GLUCOSE 109* 12/20/2013   CHOL 174 05/29/2010   TRIG 38.0 05/29/2010   HDL 54.20 05/29/2010   LDLCALC 112* 05/29/2010   ALT 14 12/20/2013   AST 15 12/20/2013   NA 137 12/20/2013   K 3.9 12/20/2013   CL 99 12/20/2013   CREATININE 0.74 12/20/2013   BUN 24* 12/20/2013   CO2 28 12/20/2013   TSH 3.84 03/18/2013    CBG 78      Assessment & Plan:

## 2014-03-09 NOTE — Assessment & Plan Note (Signed)
Worse 

## 2014-03-09 NOTE — Assessment & Plan Note (Signed)
Chronic Dr [name] Continue with current prescription therapy as reflected on the Med list.

## 2014-03-09 NOTE — Assessment & Plan Note (Signed)
[facility]  Rx Contract

## 2014-06-07 ENCOUNTER — Telehealth: Payer: Self-pay | Admitting: *Deleted

## 2014-06-07 MED ORDER — FLUCONAZOLE 150 MG PO TABS
150.0000 mg | ORAL_TABLET | Freq: Once | ORAL | Status: DC
Start: 2014-06-07 — End: 2014-09-03

## 2014-06-07 NOTE — Telephone Encounter (Signed)
Pt has call again requesting something for yeast infection...Johny Chess

## 2014-06-07 NOTE — Telephone Encounter (Signed)
Notified pt md sent diflucan to pharmacy...Melody [name]

## 2014-06-07 NOTE — Telephone Encounter (Signed)
Left msg on triage stating she has a yeast infection requesting md to rx something. She have appt on Wednesday, but this is very bothersome...Johny Chess

## 2014-06-07 NOTE — Telephone Encounter (Signed)
Ok Diflucan Thx 

## 2014-06-09 ENCOUNTER — Ambulatory Visit (INDEPENDENT_AMBULATORY_CARE_PROVIDER_SITE_OTHER): Payer: PRIVATE HEALTH INSURANCE | Admitting: Internal Medicine

## 2014-06-09 ENCOUNTER — Encounter: Payer: Self-pay | Admitting: Internal Medicine

## 2014-06-09 ENCOUNTER — Other Ambulatory Visit (INDEPENDENT_AMBULATORY_CARE_PROVIDER_SITE_OTHER): Payer: PRIVATE HEALTH INSURANCE

## 2014-06-09 VITALS — BP 118/72 | HR 80 | Temp 98.6°F | Resp 16 | Wt 140.0 lb

## 2014-06-09 DIAGNOSIS — F17200 Nicotine dependence, unspecified, uncomplicated: Secondary | ICD-10-CM

## 2014-06-09 DIAGNOSIS — N3000 Acute cystitis without hematuria: Secondary | ICD-10-CM

## 2014-06-09 DIAGNOSIS — E538 Deficiency of other specified B group vitamins: Secondary | ICD-10-CM

## 2014-06-09 DIAGNOSIS — M5430 Sciatica, unspecified side: Secondary | ICD-10-CM

## 2014-06-09 DIAGNOSIS — M5441 Lumbago with sciatica, right side: Secondary | ICD-10-CM

## 2014-06-09 DIAGNOSIS — E039 Hypothyroidism, unspecified: Secondary | ICD-10-CM

## 2014-06-09 DIAGNOSIS — N390 Urinary tract infection, site not specified: Secondary | ICD-10-CM | POA: Insufficient documentation

## 2014-06-09 DIAGNOSIS — E559 Vitamin D deficiency, unspecified: Secondary | ICD-10-CM

## 2014-06-09 DIAGNOSIS — M5442 Lumbago with sciatica, left side: Secondary | ICD-10-CM

## 2014-06-09 LAB — CBC WITH DIFFERENTIAL/PLATELET
BASOS ABS: 0 10*3/uL (ref 0.0–0.1)
BASOS PCT: 0.6 % (ref 0.0–3.0)
EOS ABS: 0.2 10*3/uL (ref 0.0–0.7)
Eosinophils Relative: 2.7 % (ref 0.0–5.0)
HCT: 39.7 % (ref 36.0–46.0)
Hemoglobin: 13.5 g/dL (ref 12.0–15.0)
LYMPHS PCT: 26.8 % (ref 12.0–46.0)
Lymphs Abs: 1.7 10*3/uL (ref 0.7–4.0)
MCHC: 34.1 g/dL (ref 30.0–36.0)
MCV: 85.2 fl (ref 78.0–100.0)
MONO ABS: 0.4 10*3/uL (ref 0.1–1.0)
Monocytes Relative: 6.2 % (ref 3.0–12.0)
Neutro Abs: 4.1 10*3/uL (ref 1.4–7.7)
Neutrophils Relative %: 63.7 % (ref 43.0–77.0)
Platelets: 170 10*3/uL (ref 150.0–400.0)
RBC: 4.66 Mil/uL (ref 3.87–5.11)
RDW: 13.9 % (ref 11.5–15.5)
WBC: 6.5 10*3/uL (ref 4.0–10.5)

## 2014-06-09 LAB — BASIC METABOLIC PANEL
BUN: 12 mg/dL (ref 6–23)
CHLORIDE: 102 meq/L (ref 96–112)
CO2: 28 mEq/L (ref 19–32)
Calcium: 9.6 mg/dL (ref 8.4–10.5)
Creatinine, Ser: 0.7 mg/dL (ref 0.4–1.2)
GFR: 92.38 mL/min (ref >60.00)
Glucose, Bld: 77 mg/dL (ref 70–99)
Potassium: 4.4 mEq/L (ref 3.5–5.1)
Sodium: 140 mEq/L (ref 135–145)

## 2014-06-09 LAB — URINALYSIS, ROUTINE W REFLEX MICROSCOPIC
Bilirubin Urine: NEGATIVE
Hgb urine dipstick: NEGATIVE
Ketones, ur: NEGATIVE
NITRITE: NEGATIVE
Specific Gravity, Urine: <=1.005 — AB (ref 1.000–1.030)
Total Protein, Urine: NEGATIVE
Urine Glucose: NEGATIVE
Urobilinogen, UA: 0.2 (ref 0.0–1.0)
pH: 7 (ref 5.0–8.0)

## 2014-06-09 MED ORDER — CIPROFLOXACIN HCL 250 MG PO TABS: 250.0000 mg | ORAL_TABLET | Freq: Two times a day (BID) | ORAL | Status: DC

## 2014-06-09 MED ORDER — VITAMIN D 1000 UNITS PO TABS: 1000.0000 [IU] | ORAL_TABLET | Freq: Every day | ORAL | Status: DC

## 2014-06-09 NOTE — Assessment & Plan Note (Signed)
Continue with current prescription therapy as reflected on the Med list.  

## 2014-06-09 NOTE — Assessment & Plan Note (Addendum)
UA - H/o Klebsiella UTI 2015 AZO prn Cipro x 7 d

## 2014-06-09 NOTE — Assessment & Plan Note (Signed)
Discussed.

## 2014-06-09 NOTE — Progress Notes (Signed)
Patient ID: Melody [name], female   DOB: 05-29-1956, 58 y.o.   MRN: [id number]   Subjective:     Dysuria  This is a new problem. The current episode started in the past 7 days. The problem occurs every urination. The problem has been unchanged. The quality of the pain is described as burning. The pain is moderate. There has been no fever. Associated symptoms include chills, frequency and urgency. The treatment provided no relief. There is no history of catheterization, kidney stones or urinary stasis.    F/u allergies  F/u LBP - achy - worse on R  The patient is here to follow up on chronic depression, anxiety, headaches and chronic moderate fibromyalgia symptoms notcontrolled with medicines; all joints hurt.  F/u fatigue - worse  Wt Readings from Last 3 Encounters:  06/09/14 140 lb (63.504 kg)  03/09/14 138 lb (62.596 kg)  01/14/14 140 lb (63.504 kg)    BP Readings from Last 3 Encounters:  06/09/14 118/72  03/09/14 104/70  01/14/14 118/84      Review of Systems  Constitutional: Positive for chills and fatigue. Negative for activity change, appetite change and unexpected weight change.  HENT: Negative for congestion and mouth sores.   Eyes: Negative for visual disturbance.  Respiratory: Negative for chest tightness.   Genitourinary: Positive for dysuria, urgency and frequency. Negative for difficulty urinating and vaginal pain.  Musculoskeletal: Positive for arthralgias. Negative for gait problem.  Skin: Negative for pallor.  Neurological: Negative for tremors.  Psychiatric/Behavioral: Positive for sleep disturbance. Negative for suicidal ideas and confusion. The patient is nervous/anxious.        Objective:   Physical Exam  Constitutional: She appears well-developed and well-nourished. No distress.  HENT:  Head: Normocephalic.  Right Ear: External ear normal.  Left Ear: External ear normal.  Nose: Nose normal.  Mouth/Throat: Oropharynx is clear and moist.  Eyes:  Conjunctivae are normal. Pupils are equal, round, and reactive to Gander. Right eye exhibits no discharge. Left eye exhibits no discharge.  Neck: Normal range of motion. Neck supple. No JVD present. No tracheal deviation present. No thyromegaly present.  Cardiovascular: Normal rate, regular rhythm and normal heart sounds.   Pulmonary/Chest: No stridor. No respiratory distress. She has no wheezes.  Abdominal: Soft. Bowel sounds are normal. She exhibits no distension and no mass. There is no tenderness. There is no rebound and no guarding.  Musculoskeletal: She exhibits tenderness (many spots on back and neck are tender). She exhibits no edema.  Lymphadenopathy:    She has no cervical adenopathy.  Neurological: She displays normal reflexes. No cranial nerve deficit. She exhibits normal muscle tone. Coordination normal.  Skin: No rash noted. No erythema.  Psychiatric: She has a normal mood and affect. Her behavior is normal. Judgment and thought content normal.  sad  Achy w/ROM - all joints   Lab Results  Component Value Date   WBC 7.5 12/20/2013   HGB 15.4* 12/20/2013   HCT 44.9 12/20/2013   PLT 126* 12/20/2013   GLUCOSE 109* 12/20/2013   CHOL 174 05/29/2010   TRIG 38.0 05/29/2010   HDL 54.20 05/29/2010   LDLCALC 112* 05/29/2010   ALT 14 12/20/2013   AST 15 12/20/2013   NA 137 12/20/2013   K 3.9 12/20/2013   CL 99 12/20/2013   CREATININE 0.74 12/20/2013   BUN 24* 12/20/2013   CO2 28 12/20/2013   TSH 3.84 03/18/2013          Assessment & Plan:

## 2014-06-09 NOTE — Progress Notes (Signed)
Pre visit review using our clinic review tool, if applicable. No additional management support is needed unless otherwise documented below in the visit note. 

## 2014-06-09 NOTE — Assessment & Plan Note (Signed)
Re-start Vit D 

## 2014-06-09 NOTE — Patient Instructions (Signed)
Use AZO for burning

## 2014-06-21 ENCOUNTER — Telehealth: Payer: Self-pay

## 2014-06-21 NOTE — Telephone Encounter (Signed)
Pt called requesting an abx for sinus infection sx. I did tell the pt that she would need to be assessed before being given a rx.  Please advise

## 2014-06-21 NOTE — Telephone Encounter (Signed)
Noted. Thx.

## 2014-06-29 ENCOUNTER — Telehealth: Payer: Self-pay

## 2014-06-29 NOTE — Telephone Encounter (Signed)
LVM for pt to call back regarding scheduling Annual Wellness Visit

## 2014-07-08 ENCOUNTER — Ambulatory Visit: Payer: PRIVATE HEALTH INSURANCE | Admitting: Nurse Practitioner

## 2014-08-03 ENCOUNTER — Other Ambulatory Visit: Payer: Self-pay | Admitting: Internal Medicine

## 2014-09-01 ENCOUNTER — Ambulatory Visit: Payer: PRIVATE HEALTH INSURANCE | Admitting: Internal Medicine

## 2014-09-03 ENCOUNTER — Other Ambulatory Visit (INDEPENDENT_AMBULATORY_CARE_PROVIDER_SITE_OTHER): Payer: PRIVATE HEALTH INSURANCE

## 2014-09-03 ENCOUNTER — Ambulatory Visit (INDEPENDENT_AMBULATORY_CARE_PROVIDER_SITE_OTHER): Payer: PRIVATE HEALTH INSURANCE | Admitting: Internal Medicine

## 2014-09-03 ENCOUNTER — Encounter: Payer: Self-pay | Admitting: Internal Medicine

## 2014-09-03 VITALS — BP 112/70 | HR 75 | Temp 98.5°F | Ht 66.0 in | Wt 140.0 lb

## 2014-09-03 DIAGNOSIS — E038 Other specified hypothyroidism: Secondary | ICD-10-CM

## 2014-09-03 DIAGNOSIS — M791 Myalgia: Secondary | ICD-10-CM

## 2014-09-03 DIAGNOSIS — M609 Myositis, unspecified: Secondary | ICD-10-CM

## 2014-09-03 DIAGNOSIS — M5441 Lumbago with sciatica, right side: Secondary | ICD-10-CM

## 2014-09-03 DIAGNOSIS — F319 Bipolar disorder, unspecified: Secondary | ICD-10-CM

## 2014-09-03 DIAGNOSIS — E538 Deficiency of other specified B group vitamins: Secondary | ICD-10-CM

## 2014-09-03 DIAGNOSIS — J449 Chronic obstructive pulmonary disease, unspecified: Secondary | ICD-10-CM

## 2014-09-03 DIAGNOSIS — M5442 Lumbago with sciatica, left side: Secondary | ICD-10-CM

## 2014-09-03 DIAGNOSIS — IMO0001 Reserved for inherently not codable concepts without codable children: Secondary | ICD-10-CM

## 2014-09-03 DIAGNOSIS — E039 Hypothyroidism, unspecified: Secondary | ICD-10-CM

## 2014-09-03 DIAGNOSIS — E034 Atrophy of thyroid (acquired): Secondary | ICD-10-CM

## 2014-09-03 DIAGNOSIS — J4520 Mild intermittent asthma, uncomplicated: Secondary | ICD-10-CM

## 2014-09-03 DIAGNOSIS — Z0000 Encounter for general adult medical examination without abnormal findings: Secondary | ICD-10-CM | POA: Insufficient documentation

## 2014-09-03 LAB — BASIC METABOLIC PANEL
BUN: 12 mg/dL (ref 6–23)
CO2: 30 mEq/L (ref 19–32)
Calcium: 9.3 mg/dL (ref 8.4–10.5)
Chloride: 104 mEq/L (ref 96–112)
Creatinine, Ser: 0.8 mg/dL (ref 0.4–1.2)
GFR: 83.84 mL/min (ref >60.00)
Glucose, Bld: 79 mg/dL (ref 70–99)
POTASSIUM: 3.9 meq/L (ref 3.5–5.1)
SODIUM: 139 meq/L (ref 135–145)

## 2014-09-03 LAB — CBC WITH DIFFERENTIAL/PLATELET
BASOS ABS: 0 10*3/uL (ref 0.0–0.1)
BASOS PCT: 0.8 % (ref 0.0–3.0)
EOS PCT: 2.2 % (ref 0.0–5.0)
Eosinophils Absolute: 0.1 10*3/uL (ref 0.0–0.7)
HCT: 43.3 % (ref 36.0–46.0)
HEMOGLOBIN: 14.1 g/dL (ref 12.0–15.0)
LYMPHS PCT: 27.9 % (ref 12.0–46.0)
Lymphs Abs: 1.5 10*3/uL (ref 0.7–4.0)
MCHC: 32.5 g/dL (ref 30.0–36.0)
MCV: 86.8 fl (ref 78.0–100.0)
MONOS PCT: 6.4 % (ref 3.0–12.0)
Monocytes Absolute: 0.4 10*3/uL (ref 0.1–1.0)
NEUTROS ABS: 3.4 10*3/uL (ref 1.4–7.7)
Neutrophils Relative %: 62.7 % (ref 43.0–77.0)
Platelets: 166 10*3/uL (ref 150.0–400.0)
RBC: 4.99 Mil/uL (ref 3.87–5.11)
RDW: 13.4 % (ref 11.5–15.5)
WBC: 5.5 10*3/uL (ref 4.0–10.5)

## 2014-09-03 LAB — HEPATIC FUNCTION PANEL
ALK PHOS: 64 U/L (ref 39–117)
ALT: 11 U/L (ref 0–35)
AST: 16 U/L (ref 0–37)
Albumin: 3.7 g/dL (ref 3.5–5.2)
Bilirubin, Direct: 0.1 mg/dL (ref 0.0–0.3)
Total Bilirubin: 1.2 mg/dL (ref 0.2–1.2)
Total Protein: 7.3 g/dL (ref 6.0–8.3)

## 2014-09-03 LAB — LIPID PANEL
Cholesterol: 186 mg/dL (ref 0–200)
HDL: 55 mg/dL (ref >39.00)
LDL CALC: 118 mg/dL — AB (ref 0–99)
NONHDL: 131
Total CHOL/HDL Ratio: 3
Triglycerides: 64 mg/dL (ref 0.0–149.0)
VLDL: 12.8 mg/dL (ref 0.0–40.0)

## 2014-09-03 LAB — VITAMIN B12: Vitamin B-12: 1266 pg/mL — ABNORMAL HIGH (ref 211–911)

## 2014-09-03 LAB — URINALYSIS
Bilirubin Urine: NEGATIVE
Hgb urine dipstick: NEGATIVE
Ketones, ur: NEGATIVE
Leukocytes, UA: NEGATIVE
NITRITE: NEGATIVE
Specific Gravity, Urine: 1.02 (ref 1.000–1.030)
TOTAL PROTEIN, URINE-UPE24: NEGATIVE
Urine Glucose: NEGATIVE
Urobilinogen, UA: 0.2 (ref 0.0–1.0)
pH: 6.5 (ref 5.0–8.0)

## 2014-09-03 LAB — TSH: TSH: 3.69 u[IU]/mL (ref 0.35–4.50)

## 2014-09-03 LAB — URIC ACID: Uric Acid, Serum: 3.7 mg/dL (ref 2.4–7.0)

## 2014-09-03 MED ORDER — FLUTICASONE PROPIONATE 50 MCG/ACT NA SUSP: 2.0000 | Freq: Every day | NASAL | Status: DC

## 2014-09-03 MED ORDER — ALBUTEROL SULFATE 108 (90 BASE) MCG/ACT IN AEPB: 1.0000 | INHALATION_SPRAY | Freq: Four times a day (QID) | RESPIRATORY_TRACT | Status: DC | PRN

## 2014-09-03 MED ORDER — OXYCODONE-ACETAMINOPHEN 5-325 MG PO TABS: 0.5000 | ORAL_TABLET | Freq: Two times a day (BID) | ORAL | Status: DC | PRN

## 2014-09-03 NOTE — Assessment & Plan Note (Signed)
7/56 Kerrie's alcoholic  husband moved back in - stressed Discussed Continue with current prescription therapy as reflected on the Med list.

## 2014-09-03 NOTE — Assessment & Plan Note (Signed)
Continue with current prescription therapy as reflected on the Med list.  

## 2014-09-03 NOTE — Patient Instructions (Signed)
Preventive Care for Adults A healthy lifestyle and preventive care can promote health and wellness. Preventive health guidelines for women include the following key practices.  A routine yearly physical is a good way to check with your health care provider about your health and preventive screening. It is a chance to share any concerns and updates on your health and to receive a thorough exam.  Visit your dentist for a routine exam and preventive care every 6 months. Brush your teeth twice a day and floss once a day. Good oral hygiene prevents tooth decay and gum disease.  The frequency of eye exams is based on your age, health, family medical history, use of contact lenses, and other factors. Follow your health care provider's recommendations for frequency of eye exams.  Eat a healthy diet. Foods like vegetables, fruits, whole grains, low-fat dairy products, and lean protein foods contain the nutrients you need without too many calories. Decrease your intake of foods high in solid fats, added sugars, and salt. Eat the right amount of calories for you.Get information about a proper diet from your health care provider, if necessary.  Regular physical exercise is one of the most important things you can do for your health. Most adults should get at least 150 minutes of moderate-intensity exercise (any activity that increases your heart rate and causes you to sweat) each week. In addition, most adults need muscle-strengthening exercises on 2 or more days a week.  Maintain a healthy weight. The body mass index (BMI) is a screening tool to identify possible weight problems. It provides an estimate of body fat based on height and weight. Your health care provider can find your BMI and can help you achieve or maintain a healthy weight.For adults 20 years and older:  A BMI below 18.5 is considered underweight.  A BMI of 18.5 to 24.9 is normal.  A BMI of 25 to 29.9 is considered overweight.  A BMI of  30 and above is considered obese.  Maintain normal blood lipids and cholesterol levels by exercising and minimizing your intake of saturated fat. Eat a balanced diet with plenty of fruit and vegetables. Blood tests for lipids and cholesterol should begin at age 76 and be repeated every 5 years. If your lipid or cholesterol levels are high, you are over 50, or you are at high risk for heart disease, you may need your cholesterol levels checked more frequently.Ongoing high lipid and cholesterol levels should be treated with medicines if diet and exercise are not working.  If you smoke, find out from your health care provider how to quit. If you do not use tobacco, do not start.  Lung cancer screening is recommended for adults aged 22-80 years who are at high risk for developing lung cancer because of a history of smoking. A yearly low-dose CT scan of the lungs is recommended for people who have at least a 30-pack-year history of smoking and are a current smoker or have quit within the past 15 years. A pack year of smoking is smoking an average of 1 pack of cigarettes a day for 1 year (for example: 1 pack a day for 30 years or 2 packs a day for 15 years). Yearly screening should continue until the smoker has stopped smoking for at least 15 years. Yearly screening should be stopped for people who develop a health problem that would prevent them from having lung cancer treatment.  If you are pregnant, do not drink alcohol. If you are breastfeeding,  be very cautious about drinking alcohol. If you are not pregnant and choose to drink alcohol, do not have more than 1 drink per day. One drink is considered to be 12 ounces (355 mL) of beer, 5 ounces (148 mL) of wine, or 1.5 ounces (44 mL) of liquor.  Avoid use of street drugs. Do not share needles with anyone. Ask for help if you need support or instructions about stopping the use of drugs.  High blood pressure causes heart disease and increases the risk of  stroke. Your blood pressure should be checked at least every 1 to 2 years. Ongoing high blood pressure should be treated with medicines if weight loss and exercise do not work.  If you are 75-52 years old, ask your health care provider if you should take aspirin to prevent strokes.  Diabetes screening involves taking a blood sample to check your fasting blood sugar level. This should be done once every 3 years, after age 15, if you are within normal weight and without risk factors for diabetes. Testing should be considered at a younger age or be carried out more frequently if you are overweight and have at least 1 risk factor for diabetes.  Breast cancer screening is essential preventive care for women. You should practice "breast self-awareness." This means understanding the normal appearance and feel of your breasts and may include breast self-examination. Any changes detected, no matter how small, should be reported to a health care provider. Women in their 58s and 30s should have a clinical breast exam (CBE) by a health care provider as part of a regular health exam every 1 to 3 years. After age 16, women should have a CBE every year. Starting at age 53, women should consider having a mammogram (breast X-ray test) every year. Women who have a family history of breast cancer should talk to their health care provider about genetic screening. Women at a high risk of breast cancer should talk to their health care providers about having an MRI and a mammogram every year.  Breast cancer gene (BRCA)-related cancer risk assessment is recommended for women who have family members with BRCA-related cancers. BRCA-related cancers include breast, ovarian, tubal, and peritoneal cancers. Having family members with these cancers may be associated with an increased risk for harmful changes (mutations) in the breast cancer genes BRCA1 and BRCA2. Results of the assessment will determine the need for genetic counseling and  BRCA1 and BRCA2 testing.  Routine pelvic exams to screen for cancer are no longer recommended for nonpregnant women who are considered low risk for cancer of the pelvic organs (ovaries, uterus, and vagina) and who do not have symptoms. Ask your health care provider if a screening pelvic exam is right for you.  If you have had past treatment for cervical cancer or a condition that could lead to cancer, you need Pap tests and screening for cancer for at least 20 years after your treatment. If Pap tests have been discontinued, your risk factors (such as having a new sexual partner) need to be reassessed to determine if screening should be resumed. Some women have medical problems that increase the chance of getting cervical cancer. In these cases, your health care provider may recommend more frequent screening and Pap tests.  The HPV test is an additional test that may be used for cervical cancer screening. The HPV test looks for the virus that can cause the cell changes on the cervix. The cells collected during the Pap test can be  tested for HPV. The HPV test could be used to screen women aged 30 years and older, and should be used in women of any age who have unclear Pap test results. After the age of 30, women should have HPV testing at the same frequency as a Pap test.  Colorectal cancer can be detected and often prevented. Most routine colorectal cancer screening begins at the age of 50 years and continues through age 75 years. However, your health care provider may recommend screening at an earlier age if you have risk factors for colon cancer. On a yearly basis, your health care provider may provide home test kits to check for hidden blood in the stool. Use of a small camera at the end of a tube, to directly examine the colon (sigmoidoscopy or colonoscopy), can detect the earliest forms of colorectal cancer. Talk to your health care provider about this at age 50, when routine screening begins. Direct  exam of the colon should be repeated every 5-10 years through age 75 years, unless early forms of pre-cancerous polyps or small growths are found.  People who are at an increased risk for hepatitis B should be screened for this virus. You are considered at high risk for hepatitis B if:  You were born in a country where hepatitis B occurs often. Talk with your health care provider about which countries are considered high risk.  Your parents were born in a high-risk country and you have not received a shot to protect against hepatitis B (hepatitis B vaccine).  You have HIV or AIDS.  You use needles to inject street drugs.  You live with, or have sex with, someone who has hepatitis B.  You get hemodialysis treatment.  You take certain medicines for conditions like cancer, organ transplantation, and autoimmune conditions.  Hepatitis C blood testing is recommended for all people born from 1945 through 1965 and any individual with known risks for hepatitis C.  Practice safe sex. Use condoms and avoid high-risk sexual practices to reduce the spread of sexually transmitted infections (STIs). STIs include gonorrhea, chlamydia, syphilis, trichomonas, herpes, HPV, and human immunodeficiency virus (HIV). Herpes, HIV, and HPV are viral illnesses that have no cure. They can result in disability, cancer, and death.  You should be screened for sexually transmitted illnesses (STIs) including gonorrhea and chlamydia if:  You are sexually active and are younger than 24 years.  You are older than 24 years and your health care provider tells you that you are at risk for this type of infection.  Your sexual activity has changed since you were last screened and you are at an increased risk for chlamydia or gonorrhea. Ask your health care provider if you are at risk.  If you are at risk of being infected with HIV, it is recommended that you take a prescription medicine daily to prevent HIV infection. This is  called preexposure prophylaxis (PrEP). You are considered at risk if:  You are a heterosexual woman, are sexually active, and are at increased risk for HIV infection.  You take drugs by injection.  You are sexually active with a partner who has HIV.  Talk with your health care provider about whether you are at high risk of being infected with HIV. If you choose to begin PrEP, you should first be tested for HIV. You should then be tested every 3 months for as long as you are taking PrEP.  Osteoporosis is a disease in which the bones lose minerals and strength   with aging. This can result in serious bone fractures or breaks. The risk of osteoporosis can be identified using a bone density scan. Women ages 80 years and over and women at risk for fractures or osteoporosis should discuss screening with their health care providers. Ask your health care provider whether you should take a calcium supplement or vitamin D to reduce the rate of osteoporosis.  Menopause can be associated with physical symptoms and risks. Hormone replacement therapy is available to decrease symptoms and risks. You should talk to your health care provider about whether hormone replacement therapy is right for you.  Use sunscreen. Apply sunscreen liberally and repeatedly throughout the day. You should seek shade when your shadow is shorter than you. Protect yourself by wearing long sleeves, pants, a wide-brimmed hat, and sunglasses year round, whenever you are outdoors.  Once a month, do a whole body skin exam, using a mirror to look at the skin on your back. Tell your health care provider of new moles, moles that have irregular borders, moles that are larger than a pencil eraser, or moles that have changed in shape or color.  Stay current with required vaccines (immunizations).  Influenza vaccine. All adults should be immunized every year.  Tetanus, diphtheria, and acellular pertussis (Td, Tdap) vaccine. Pregnant women should  receive 1 dose of Tdap vaccine during each pregnancy. The dose should be obtained regardless of the length of time since the last dose. Immunization is preferred during the 27th-36th week of gestation. An adult who has not previously received Tdap or who does not know her vaccine status should receive 1 dose of Tdap. This initial dose should be followed by tetanus and diphtheria toxoids (Td) booster doses every 10 years. Adults with an unknown or incomplete history of completing a 3-dose immunization series with Td-containing vaccines should begin or complete a primary immunization series including a Tdap dose. Adults should receive a Td booster every 10 years.  Varicella vaccine. An adult without evidence of immunity to varicella should receive 2 doses or a second dose if she has previously received 1 dose. Pregnant females who do not have evidence of immunity should receive the first dose after pregnancy. This first dose should be obtained before leaving the health care facility. The second dose should be obtained 4-8 weeks after the first dose.  Human papillomavirus (HPV) vaccine. Females aged 13-26 years who have not received the vaccine previously should obtain the 3-dose series. The vaccine is not recommended for use in pregnant females. However, pregnancy testing is not needed before receiving a dose. If a female is found to be pregnant after receiving a dose, no treatment is needed. In that case, the remaining doses should be delayed until after the pregnancy. Immunization is recommended for any person with an immunocompromised condition through the age of 64 years if she did not get any or all doses earlier. During the 3-dose series, the second dose should be obtained 4-8 weeks after the first dose. The third dose should be obtained 24 weeks after the first dose and 16 weeks after the second dose.  Zoster vaccine. One dose is recommended for adults aged 49 years or older unless certain conditions are  present.  Measles, mumps, and rubella (MMR) vaccine. Adults born before 20 generally are considered immune to measles and mumps. Adults born in [birth year] or later should have 1 or more doses of MMR vaccine unless there is a contraindication to the vaccine or there is laboratory evidence of immunity to  each of the three diseases. A routine second dose of MMR vaccine should be obtained at least 28 days after the first dose for students attending postsecondary schools, health care workers, or international travelers. People who received inactivated measles vaccine or an unknown type of measles vaccine during 1963-1967 should receive 2 doses of MMR vaccine. People who received inactivated mumps vaccine or an unknown type of mumps vaccine before 1979 and are at high risk for mumps infection should consider immunization with 2 doses of MMR vaccine. For females of childbearing age, rubella immunity should be determined. If there is no evidence of immunity, females who are not pregnant should be vaccinated. If there is no evidence of immunity, females who are pregnant should delay immunization until after pregnancy. Unvaccinated health care workers born before [birth year] who lack laboratory evidence of measles, mumps, or rubella immunity or laboratory confirmation of disease should consider measles and mumps immunization with 2 doses of MMR vaccine or rubella immunization with 1 dose of MMR vaccine.  Pneumococcal 13-valent conjugate (PCV13) vaccine. When indicated, a person who is uncertain of her immunization history and has no record of immunization should receive the PCV13 vaccine. An adult aged 67 years or older who has certain medical conditions and has not been previously immunized should receive 1 dose of PCV13 vaccine. This PCV13 should be followed with a dose of pneumococcal polysaccharide (PPSV23) vaccine. The PPSV23 vaccine dose should be obtained at least 8 weeks after the dose of PCV13 vaccine. An adult aged 65  years or older who has certain medical conditions and previously received 1 or more doses of PPSV23 vaccine should receive 1 dose of PCV13. The PCV13 vaccine dose should be obtained 1 or more years after the last PPSV23 vaccine dose.  Pneumococcal polysaccharide (PPSV23) vaccine. When PCV13 is also indicated, PCV13 should be obtained first. All adults aged 47 years and older should be immunized. An adult younger than age 52 years who has certain medical conditions should be immunized. Any person who resides in a nursing home or long-term care facility should be immunized. An adult smoker should be immunized. People with an immunocompromised condition and certain other conditions should receive both PCV13 and PPSV23 vaccines. People with human immunodeficiency virus (HIV) infection should be immunized as soon as possible after diagnosis. Immunization during chemotherapy or radiation therapy should be avoided. Routine use of PPSV23 vaccine is not recommended for American Indians, [place] Natives, or people younger than 65 years unless there are medical conditions that require PPSV23 vaccine. When indicated, people who have unknown immunization and have no record of immunization should receive PPSV23 vaccine. One-time revaccination 5 years after the first dose of PPSV23 is recommended for people aged 19-64 years who have chronic kidney failure, nephrotic syndrome, asplenia, or immunocompromised conditions. People who received 1-2 doses of PPSV23 before age 41 years should receive another dose of PPSV23 vaccine at age 34 years or later if at least 5 years have passed since the previous dose. Doses of PPSV23 are not needed for people immunized with PPSV23 at or after age 32 years.  Meningococcal vaccine. Adults with asplenia or persistent complement component deficiencies should receive 2 doses of quadrivalent meningococcal conjugate (MenACWY-D) vaccine. The doses should be obtained at least 2 months apart.  Microbiologists working with certain meningococcal bacteria, [place] recruits, people at risk during an outbreak, and people who travel to or live in countries with a high rate of meningitis should be immunized. A first-year college student up through age  21 years who is living in a residence hall should receive a dose if she did not receive a dose on or after her 16th birthday. Adults who have certain high-risk conditions should receive one or more doses of vaccine.  Hepatitis A vaccine. Adults who wish to be protected from this disease, have certain high-risk conditions, work with hepatitis A-infected animals, work in hepatitis A research labs, or travel to or work in countries with a high rate of hepatitis A should be immunized. Adults who were previously unvaccinated and who anticipate close contact with an international adoptee during the first 60 days after arrival in the [place] from a country with a high rate of hepatitis A should be immunized.  Hepatitis B vaccine. Adults who wish to be protected from this disease, have certain high-risk conditions, may be exposed to blood or other infectious body fluids, are household contacts or sex partners of hepatitis B positive people, are clients or workers in certain care facilities, or travel to or work in countries with a high rate of hepatitis B should be immunized.  Haemophilus influenzae type b (Hib) vaccine. A previously unvaccinated person with asplenia or sickle cell disease or having a scheduled splenectomy should receive 1 dose of Hib vaccine. Regardless of previous immunization, a recipient of a hematopoietic stem cell transplant should receive a 3-dose series 6-12 months after her successful transplant. Hib vaccine is not recommended for adults with HIV infection. Preventive Services / Frequency Ages 64 to 68 years  Blood pressure check.** / Every 1 to 2 years.  Lipid and cholesterol check.** / Every 5 years beginning at age  22.  Clinical breast exam.** / Every 3 years for women in their 88s and 53s.  BRCA-related cancer risk assessment.** / For women who have family members with a BRCA-related cancer (breast, ovarian, tubal, or peritoneal cancers).  Pap test.** / Every 2 years from ages 90 through 51. Every 3 years starting at age 21 through age 56 or 3 with a history of 3 consecutive normal Pap tests.  HPV screening.** / Every 3 years from ages 24 through ages 1 to 46 with a history of 3 consecutive normal Pap tests.  Hepatitis C blood test.** / For any individual with known risks for hepatitis C.  Skin self-exam. / Monthly.  Influenza vaccine. / Every year.  Tetanus, diphtheria, and acellular pertussis (Tdap, Td) vaccine.** / Consult your health care provider. Pregnant women should receive 1 dose of Tdap vaccine during each pregnancy. 1 dose of Td every 10 years.  Varicella vaccine.** / Consult your health care provider. Pregnant females who do not have evidence of immunity should receive the first dose after pregnancy.  HPV vaccine. / 3 doses over 6 months, if 72 and younger. The vaccine is not recommended for use in pregnant females. However, pregnancy testing is not needed before receiving a dose.  Measles, mumps, rubella (MMR) vaccine.** / You need at least 1 dose of MMR if you were born in 1957 or later. You may also need a 2nd dose. For females of childbearing age, rubella immunity should be determined. If there is no evidence of immunity, females who are not pregnant should be vaccinated. If there is no evidence of immunity, females who are pregnant should delay immunization until after pregnancy.  Pneumococcal 13-valent conjugate (PCV13) vaccine.** / Consult your health care provider.  Pneumococcal polysaccharide (PPSV23) vaccine.** / 1 to 2 doses if you smoke cigarettes or if you have certain conditions.  Meningococcal vaccine.** /  1 dose if you are age 63 to 91 years and a [place] living in a residence hall, or have one of several medical conditions, you need to get vaccinated against meningococcal disease. You may also need additional booster doses.  Hepatitis A vaccine.** / Consult your health care provider.  Hepatitis B vaccine.** / Consult your health care provider.  Haemophilus influenzae type b (Hib) vaccine.** / Consult your health care provider. Ages 1 to 5 years  Blood pressure check.** / Every 1 to 2 years.  Lipid and cholesterol check.** / Every 5 years beginning at age 17 years.  Lung cancer screening. / Every year if you are aged 27-80 years and have a 30-pack-year history of smoking and currently smoke or have quit within the past 15 years. Yearly screening is stopped once you have quit smoking for at least 15 years or develop a health problem that would prevent you from having lung cancer treatment.  Clinical breast exam.** / Every year after age 56 years.  BRCA-related cancer risk assessment.** / For women who have family members with a BRCA-related cancer (breast, ovarian, tubal, or peritoneal cancers).  Mammogram.** / Every year beginning at age 35 years and continuing for as long as you are in good health. Consult with your health care provider.  Pap test.** / Every 3 years starting at age 12 years through age 37 or 70 years with a history of 3 consecutive normal Pap tests.  HPV screening.** / Every 3 years from ages 57 years through ages 81 to 26 years with a history of 3 consecutive normal Pap tests.  Fecal occult blood test (FOBT) of stool. / Every year beginning at age 71 years and continuing until age 64 years. You may not need to do this test if you get a colonoscopy every 10 years.  Flexible sigmoidoscopy or colonoscopy.** / Every 5 years for a flexible sigmoidoscopy or every 10 years for a colonoscopy beginning at age 1 years and continuing until age 32 years.  Hepatitis C blood test.** / For all people born from [birth year] through  1965 and any individual with known risks for hepatitis C.  Skin self-exam. / Monthly.  Influenza vaccine. / Every year.  Tetanus, diphtheria, and acellular pertussis (Tdap/Td) vaccine.** / Consult your health care provider. Pregnant women should receive 1 dose of Tdap vaccine during each pregnancy. 1 dose of Td every 10 years.  Varicella vaccine.** / Consult your health care provider. Pregnant females who do not have evidence of immunity should receive the first dose after pregnancy.  Zoster vaccine.** / 1 dose for adults aged 46 years or older.  Measles, mumps, rubella (MMR) vaccine.** / You need at least 1 dose of MMR if you were born in 1957 or later. You may also need a 2nd dose. For females of childbearing age, rubella immunity should be determined. If there is no evidence of immunity, females who are not pregnant should be vaccinated. If there is no evidence of immunity, females who are pregnant should delay immunization until after pregnancy.  Pneumococcal 13-valent conjugate (PCV13) vaccine.** / Consult your health care provider.  Pneumococcal polysaccharide (PPSV23) vaccine.** / 1 to 2 doses if you smoke cigarettes or if you have certain conditions.  Meningococcal vaccine.** / Consult your health care provider.  Hepatitis A vaccine.** / Consult your health care provider.  Hepatitis B vaccine.** / Consult your health care provider.  Haemophilus influenzae type b (Hib) vaccine.** / Consult your health care provider. Ages 37  years and over  Blood pressure check.** / Every 1 to 2 years.  Lipid and cholesterol check.** / Every 5 years beginning at age 36 years.  Lung cancer screening. / Every year if you are aged 62-80 years and have a 30-pack-year history of smoking and currently smoke or have quit within the past 15 years. Yearly screening is stopped once you have quit smoking for at least 15 years or develop a health problem that would prevent you from having lung cancer  treatment.  Clinical breast exam.** / Every year after age 76 years.  BRCA-related cancer risk assessment.** / For women who have family members with a BRCA-related cancer (breast, ovarian, tubal, or peritoneal cancers).  Mammogram.** / Every year beginning at age 39 years and continuing for as long as you are in good health. Consult with your health care provider.  Pap test.** / Every 3 years starting at age 2 years through age 61 or 53 years with 3 consecutive normal Pap tests. Testing can be stopped between 65 and 70 years with 3 consecutive normal Pap tests and no abnormal Pap or HPV tests in the past 10 years.  HPV screening.** / Every 3 years from ages 16 years through ages 82 or 80 years with a history of 3 consecutive normal Pap tests. Testing can be stopped between 65 and 70 years with 3 consecutive normal Pap tests and no abnormal Pap or HPV tests in the past 10 years.  Fecal occult blood test (FOBT) of stool. / Every year beginning at age 53 years and continuing until age 3 years. You may not need to do this test if you get a colonoscopy every 10 years.  Flexible sigmoidoscopy or colonoscopy.** / Every 5 years for a flexible sigmoidoscopy or every 10 years for a colonoscopy beginning at age 67 years and continuing until age 43 years.  Hepatitis C blood test.** / For all people born from [birth year] through 1966 and any individual with known risks for hepatitis C.  Osteoporosis screening.** / A one-time screening for women ages 75 years and over and women at risk for fractures or osteoporosis.  Skin self-exam. / Monthly.  Influenza vaccine. / Every year.  Tetanus, diphtheria, and acellular pertussis (Tdap/Td) vaccine.** / 1 dose of Td every 10 years.  Varicella vaccine.** / Consult your health care provider.  Zoster vaccine.** / 1 dose for adults aged 22 years or older.  Pneumococcal 13-valent conjugate (PCV13) vaccine.** / Consult your health care provider.  Pneumococcal  polysaccharide (PPSV23) vaccine.** / 1 dose for all adults aged 92 years and older.  Meningococcal vaccine.** / Consult your health care provider.  Hepatitis A vaccine.** / Consult your health care provider.  Hepatitis B vaccine.** / Consult your health care provider.  Haemophilus influenzae type b (Hib) vaccine.** / Consult your health care provider. ** Family history and personal history of risk and conditions may change your health care provider's recommendations. Document Released: 12/11/2001 Document Revised: 03/01/2014 Document Reviewed: 03/12/2011 [facility] Patient Information 2015 [place], [place]. This information is not intended to replace advice given to you by your health care provider. Make sure you discuss any questions you have with your health care provider.

## 2014-09-03 NOTE — Progress Notes (Signed)
   Subjective:   C/o neck pain, lyrica has helped 4/97 Shanin's alcoholic  husband moved back in - stressed   Dysuria  Associated symptoms include chills, frequency and urgency.    F/u allergies  F/u LBP - achy - worse on R  The patient is here to follow up on chronic depression, anxiety, headaches and chronic moderate fibromyalgia symptoms notcontrolled with medicines; all joints hurt.  F/u fatigue - worse  Wt Readings from Last 3 Encounters:  09/03/14 140 lb (63.504 kg)  06/09/14 140 lb (63.504 kg)  03/09/14 138 lb (62.596 kg)    BP Readings from Last 3 Encounters:  09/03/14 112/70  06/09/14 118/72  03/09/14 104/70      Review of Systems  Constitutional: Positive for chills and fatigue. Negative for activity change, appetite change and unexpected weight change.  HENT: Negative for congestion and mouth sores.   Eyes: Negative for visual disturbance.  Respiratory: Negative for chest tightness.   Genitourinary: Positive for dysuria, urgency and frequency. Negative for difficulty urinating and vaginal pain.  Musculoskeletal: Positive for arthralgias. Negative for gait problem.  Skin: Negative for pallor.  Neurological: Negative for tremors.  Psychiatric/Behavioral: Positive for sleep disturbance. Negative for suicidal ideas and confusion. The patient is nervous/anxious.        Objective:   Physical Exam  Constitutional: She appears well-developed. No distress.  HENT:  Head: Normocephalic.  Right Ear: External ear normal.  Left Ear: External ear normal.  Nose: Nose normal.  Mouth/Throat: Oropharynx is clear and moist.  Eyes: Conjunctivae are normal. Pupils are equal, round, and reactive to [place]. Right eye exhibits no discharge. Left eye exhibits no discharge.  Neck: Normal range of motion. Neck supple. No JVD present. No tracheal deviation present. No thyromegaly present.  Cardiovascular: Normal rate, regular rhythm and normal heart sounds.   Pulmonary/Chest: No  stridor. No respiratory distress. She has no wheezes.  Abdominal: Soft. Bowel sounds are normal. She exhibits no distension and no mass. There is no tenderness. There is no rebound and no guarding.  Musculoskeletal: She exhibits no edema or tenderness.  Lymphadenopathy:    She has no cervical adenopathy.  Neurological: She displays normal reflexes. No cranial nerve deficit. She exhibits normal muscle tone. Coordination normal.  Skin: No rash noted. No erythema.  Psychiatric: She has a normal mood and affect. Her behavior is normal. Judgment and thought content normal.  Achy w/ROM - all joints and neck   Lab Results  Component Value Date   WBC 6.5 06/09/2014   HGB 13.5 06/09/2014   HCT 39.7 06/09/2014   PLT 170.0 06/09/2014   GLUCOSE 77 06/09/2014   CHOL 174 05/29/2010   TRIG 38.0 05/29/2010   HDL 54.20 05/29/2010   LDLCALC 112* 05/29/2010   ALT 14 12/20/2013   AST 15 12/20/2013   NA 140 06/09/2014   K 4.4 06/09/2014   CL 102 06/09/2014   CREATININE 0.7 06/09/2014   BUN 12 06/09/2014   CO2 28 06/09/2014   TSH 3.84 03/18/2013    I personally provided Respiclick inhaler use teaching. After the teaching patient was able to demonstrate it's use effectively. All questions were answered       Assessment & Plan:

## 2014-09-03 NOTE — Assessment & Plan Note (Signed)
Chronic  Potential benefits of a long term opioids use as well as potential risks (i.e. addiction risk, apnea etc) and complications (i.e. Somnolence, constipation and others) were explained to the patient and were aknowledged.  Potential benefits of a long term NSAID use as well as potential risks  and complications were explained to the patient and were aknowledged. Lyrica Rx discussed

## 2014-09-03 NOTE — Progress Notes (Signed)
Pre visit review using our clinic review tool, if applicable. No additional management support is needed unless otherwise documented below in the visit note. 

## 2014-09-03 NOTE — Assessment & Plan Note (Signed)
Proair prn Doesn't smoke

## 2014-09-03 NOTE — Assessment & Plan Note (Signed)
Here for medicare wellness/physical  Diet: heart healthy  Physical activity: sedentary  Depression/mood screen: negative  Hearing: intact to whispered voice  Visual acuity: grossly normal, performs annual eye exam  ADLs: capable  Fall risk: none  Home safety: good  Cognitive evaluation: intact to orientation, naming, recall and repetition  EOL planning: adv directives, full code/ I agree  I have personally reviewed and have noted  1. The patient's medical and social history  2. Their use of alcohol, tobacco or illicit drugs  3. Their current medications and supplements  4. The patient's functional ability including ADL's, fall risks, home safety risks and hearing or visual impairment.  5. Diet and physical activities  6. Evidence for depression or mood disorders    Today patient counseled on age appropriate routine health concerns for screening and prevention, each reviewed and up to date or declined. Immunizations reviewed and up to date or declined. Labs ordered and reviewed. Risk factors for depression reviewed and negative. Hearing function and visual acuity are intact. ADLs screened and addressed as needed. Functional ability and level of safety reviewed and appropriate. Education, counseling and referrals performed based on assessed risks today. Patient provided with a copy of personalized plan for preventive services.   3/01 Shanyce's alcoholic  husband moved back in - stressed

## 2014-09-03 NOTE — Assessment & Plan Note (Signed)
Chronic, MSK 2/14 worse  Potential benefits of a long term opioids use as well as potential risks (i.e. addiction risk, apnea etc) and complications (i.e. Somnolence, constipation and others) were explained to the patient and were aknowledged.  Potential benefits of a long term NSAID  use as well as potential risks  and complications were explained to the patient and were aknowledged.

## 2014-09-29 ENCOUNTER — Telehealth: Payer: Self-pay | Admitting: *Deleted

## 2014-09-29 NOTE — Telephone Encounter (Signed)
Left msg on triage stating wanting to let md know that the pain med is working for her. Wanting to know how can she get refills...Melody [name]

## 2014-09-30 MED ORDER — OXYCODONE-ACETAMINOPHEN 5-325 MG PO TABS: 0.5000 | ORAL_TABLET | Freq: Two times a day (BID) | ORAL | Status: DC | PRN

## 2014-09-30 NOTE — Telephone Encounter (Signed)
Ok Thx 

## 2014-09-30 NOTE — Telephone Encounter (Signed)
Notified pt rx ready for pick-up.../lmb 

## 2014-10-11 ENCOUNTER — Other Ambulatory Visit: Payer: Self-pay | Admitting: Internal Medicine

## 2014-11-04 ENCOUNTER — Telehealth: Payer: Self-pay | Admitting: Internal Medicine

## 2014-11-04 DIAGNOSIS — M79676 Pain in unspecified toe(s): Secondary | ICD-10-CM

## 2014-11-04 NOTE — Telephone Encounter (Signed)
Pt called said that she would like a referral to the foot center because her other toe is infected now an needs to be cut out.  She wanted to know if she could get this appt ASAP

## 2014-11-05 NOTE — Telephone Encounter (Signed)
I'll place a ref request for next week. or to ER or [facility] tonight (open late) if toe looks bad Thx

## 2014-11-08 NOTE — Telephone Encounter (Signed)
Called pt no answer LMOM (c) with md response.../lmb 

## 2014-11-15 ENCOUNTER — Telehealth: Payer: Self-pay | Admitting: Internal Medicine

## 2014-11-15 NOTE — Telephone Encounter (Signed)
Pls let pt know

## 2014-11-15 NOTE — Telephone Encounter (Signed)
Pt calling back, pt cannot put contacts in as they hurt, drainage from sinus down throat into chest. Pls advise

## 2014-11-15 NOTE — Telephone Encounter (Signed)
Pt is having head,ear, right eye pain, sinus pain and no fever. Pt wants a ABX, as she states her PCP has called in for her in the past. Pls advise

## 2014-11-16 MED ORDER — AZITHROMYCIN 250 MG PO TABS: ORAL_TABLET | ORAL | Status: DC

## 2014-11-16 NOTE — Telephone Encounter (Signed)
Ok [facility] - emailed Thx

## 2014-11-16 NOTE — Telephone Encounter (Signed)
Pt informed

## 2014-11-22 ENCOUNTER — Ambulatory Visit (INDEPENDENT_AMBULATORY_CARE_PROVIDER_SITE_OTHER): Payer: Medicare Other | Admitting: Podiatry

## 2014-11-22 ENCOUNTER — Encounter: Payer: Self-pay | Admitting: Podiatry

## 2014-11-22 VITALS — BP 113/71 | HR 76 | Resp 16

## 2014-11-22 DIAGNOSIS — L600 Ingrowing nail: Secondary | ICD-10-CM | POA: Diagnosis not present

## 2014-11-22 NOTE — Patient Instructions (Signed)

## 2014-11-24 NOTE — Progress Notes (Signed)
Subjective:     Patient ID: Melody [name], female   DOB: [birth year], 59 y.o.   MRN: [id number]  HPI patient states my right big toe on this lateral border has become painful like my left foot. States it's been hard to wear shoe gear comfortably   Review of Systems     Objective:   Physical Exam Neurovascular status is intact with incurvated right hallux lateral border that's painful when pressed and making shoe gear difficult    Assessment:     Ingrown toenail deformity right hallux lateral border    Plan:     Reviewed condition and recommended removal of the nail corner. Patient wants this procedure done and I explained the procedure and risk. I infiltrated 60 mg Xylocaine Marcaine mixture remove the lateral corner exposing the matrix and applied phenol 3 applications 30 seconds followed by alcohol lavaged and sterile dressing. Gave instructions on soaks

## 2014-12-07 ENCOUNTER — Ambulatory Visit (INDEPENDENT_AMBULATORY_CARE_PROVIDER_SITE_OTHER): Payer: 59 | Admitting: Internal Medicine

## 2014-12-07 ENCOUNTER — Encounter: Payer: Self-pay | Admitting: Internal Medicine

## 2014-12-07 VITALS — BP 140/82 | HR 87 | Temp 98.4°F | Wt 141.0 lb

## 2014-12-07 DIAGNOSIS — M5440 Lumbago with sciatica, unspecified side: Secondary | ICD-10-CM

## 2014-12-07 DIAGNOSIS — F411 Generalized anxiety disorder: Secondary | ICD-10-CM | POA: Diagnosis not present

## 2014-12-07 DIAGNOSIS — E538 Deficiency of other specified B group vitamins: Secondary | ICD-10-CM | POA: Diagnosis not present

## 2014-12-07 DIAGNOSIS — E039 Hypothyroidism, unspecified: Secondary | ICD-10-CM

## 2014-12-07 DIAGNOSIS — J0100 Acute maxillary sinusitis, unspecified: Secondary | ICD-10-CM

## 2014-12-07 DIAGNOSIS — E559 Vitamin D deficiency, unspecified: Secondary | ICD-10-CM

## 2014-12-07 MED ORDER — AMOXICILLIN 875 MG PO TABS: 875.0000 mg | ORAL_TABLET | Freq: Two times a day (BID) | ORAL | Status: DC

## 2014-12-07 MED ORDER — CYANOCOBALAMIN 1000 MCG/ML IJ SOLN: 1000.0000 ug | INTRAMUSCULAR | Status: DC

## 2014-12-07 MED ORDER — OXYCODONE-ACETAMINOPHEN 5-325 MG PO TABS: 0.5000 | ORAL_TABLET | Freq: Two times a day (BID) | ORAL | Status: DC | PRN

## 2014-12-07 MED ORDER — VITAMIN D 1000 UNITS PO TABS: 2000.0000 [IU] | ORAL_TABLET | Freq: Every day | ORAL | Status: AC

## 2014-12-07 MED ORDER — LANSOPRAZOLE 30 MG PO CPDR: 30.0000 mg | DELAYED_RELEASE_CAPSULE | Freq: Every morning | ORAL | Status: DC

## 2014-12-07 NOTE — Assessment & Plan Note (Signed)
Chronic, MSK   Potential benefits of a long term opioids use as well as potential risks (i.e. addiction risk, apnea etc) and complications (i.e. Somnolence, constipation and others) were explained to the patient and were aknowledged.  Potential benefits of a long term NSAID  use as well as potential risks  and complications were explained to the patient and were aknowledged.

## 2014-12-07 NOTE — Assessment & Plan Note (Signed)
Continue with current prescription therapy as reflected on the Med list.  

## 2014-12-07 NOTE — Progress Notes (Signed)
Pre visit review using our clinic review tool, if applicable. No additional management support is needed unless otherwise documented below in the visit note. 

## 2014-12-07 NOTE — Progress Notes (Signed)
   Subjective:   C/o neck pain, lyrica has helped 5/63 Arretta's alcoholic  husband moved back in - stressed   Dysuria  Associated symptoms include chills, frequency and urgency.    F/u allergies  F/u LBP - achy - Percocet has helped a lot  The patient is here to follow up on chronic depression, anxiety, headaches and chronic moderate fibromyalgia symptoms notcontrolled with medicines; all joints hurt.  F/u fatigue - better  Wt Readings from Last 3 Encounters:  12/07/14 141 lb (63.957 kg)  09/03/14 140 lb (63.504 kg)  06/09/14 140 lb (63.504 kg)    BP Readings from Last 3 Encounters:  12/07/14 140/82  11/22/14 113/71  09/03/14 112/70      Review of Systems  Constitutional: Positive for chills and fatigue. Negative for activity change, appetite change and unexpected weight change.  HENT: Negative for congestion and mouth sores.   Eyes: Negative for visual disturbance.  Respiratory: Negative for chest tightness.   Genitourinary: Positive for dysuria, urgency and frequency. Negative for difficulty urinating and vaginal pain.  Musculoskeletal: Positive for arthralgias. Negative for gait problem.  Skin: Negative for pallor.  Neurological: Negative for tremors.  Psychiatric/Behavioral: Positive for sleep disturbance. Negative for suicidal ideas and confusion. The patient is nervous/anxious.        Objective:   Physical Exam  Constitutional: She appears well-developed. No distress.  HENT:  Head: Normocephalic.  Right Ear: External ear normal.  Left Ear: External ear normal.  Nose: Nose normal.  Mouth/Throat: Oropharynx is clear and moist.  Eyes: Conjunctivae are normal. Pupils are equal, round, and reactive to [place]. Right eye exhibits no discharge. Left eye exhibits no discharge.  Neck: Normal range of motion. Neck supple. No JVD present. No tracheal deviation present. No thyromegaly present.  Cardiovascular: Normal rate, regular rhythm and normal heart sounds.    Pulmonary/Chest: No stridor. No respiratory distress. She has no wheezes.  Abdominal: Soft. Bowel sounds are normal. She exhibits no distension and no mass. There is no tenderness. There is no rebound and no guarding.  Musculoskeletal: She exhibits no edema or tenderness.  Lymphadenopathy:    She has no cervical adenopathy.  Neurological: She displays normal reflexes. No cranial nerve deficit. She exhibits normal muscle tone. Coordination normal.  Skin: No rash noted. No erythema.  Psychiatric: She has a normal mood and affect. Her behavior is normal. Judgment and thought content normal.  Achy w/ROM - all joints and neck   Lab Results  Component Value Date   WBC 5.5 09/03/2014   HGB 14.1 09/03/2014   HCT 43.3 09/03/2014   PLT 166.0 09/03/2014   GLUCOSE 79 09/03/2014   CHOL 186 09/03/2014   TRIG 64.0 09/03/2014   HDL 55.00 09/03/2014   LDLCALC 118* 09/03/2014   ALT 11 09/03/2014   AST 16 09/03/2014   NA 139 09/03/2014   K 3.9 09/03/2014   CL 104 09/03/2014   CREATININE 0.8 09/03/2014   BUN 12 09/03/2014   CO2 30 09/03/2014   TSH 3.69 09/03/2014         Assessment & Plan:

## 2014-12-07 NOTE — Assessment & Plan Note (Signed)
Amoxicillin po 

## 2015-01-11 ENCOUNTER — Other Ambulatory Visit: Payer: Self-pay | Admitting: Internal Medicine

## 2015-02-03 ENCOUNTER — Telehealth: Payer: Self-pay | Admitting: Internal Medicine

## 2015-02-03 MED ORDER — LANSOPRAZOLE 30 MG PO CPDR: 30.0000 mg | DELAYED_RELEASE_CAPSULE | Freq: Every morning | ORAL | Status: DC

## 2015-02-03 NOTE — Telephone Encounter (Signed)
Patient is requesting lansoprazole to be sent to [facility].  Patient states she received something in the mail from [place] in regards to med but she is not sure what it was and she is out of med right now.

## 2015-02-03 NOTE — Telephone Encounter (Signed)
Notified pt rx has been sent,,,/lmb

## 2015-02-08 ENCOUNTER — Encounter: Payer: Self-pay | Admitting: Internal Medicine

## 2015-02-08 ENCOUNTER — Telehealth: Payer: Self-pay | Admitting: *Deleted

## 2015-02-08 ENCOUNTER — Ambulatory Visit (INDEPENDENT_AMBULATORY_CARE_PROVIDER_SITE_OTHER): Payer: Medicare Other | Admitting: Internal Medicine

## 2015-02-08 VITALS — BP 134/82 | HR 75 | Temp 97.8°F | Resp 18 | Wt 143.0 lb

## 2015-02-08 DIAGNOSIS — E538 Deficiency of other specified B group vitamins: Secondary | ICD-10-CM | POA: Diagnosis not present

## 2015-02-08 DIAGNOSIS — F319 Bipolar disorder, unspecified: Secondary | ICD-10-CM | POA: Diagnosis not present

## 2015-02-08 DIAGNOSIS — M5440 Lumbago with sciatica, unspecified side: Secondary | ICD-10-CM | POA: Diagnosis not present

## 2015-02-08 DIAGNOSIS — F411 Generalized anxiety disorder: Secondary | ICD-10-CM

## 2015-02-08 MED ORDER — OXYCODONE-ACETAMINOPHEN 5-325 MG PO TABS: 0.5000 | ORAL_TABLET | Freq: Two times a day (BID) | ORAL | Status: DC | PRN

## 2015-02-08 MED ORDER — FLUTICASONE PROPIONATE 50 MCG/ACT NA SUSP: NASAL | Status: DC

## 2015-02-08 MED ORDER — LEVOTHYROXINE SODIUM 50 MCG PO TABS: 50.0000 ug | ORAL_TABLET | Freq: Every day | ORAL | Status: DC

## 2015-02-08 MED ORDER — LORATADINE 10 MG PO TABS: 10.0000 mg | ORAL_TABLET | Freq: Every day | ORAL | Status: DC

## 2015-02-08 MED ORDER — CYANOCOBALAMIN 1000 MCG/ML IJ SOLN: 1000.0000 ug | INTRAMUSCULAR | Status: DC

## 2015-02-08 MED ORDER — PANTOPRAZOLE SODIUM 40 MG PO TBEC: 40.0000 mg | DELAYED_RELEASE_TABLET | Freq: Every day | ORAL | Status: DC

## 2015-02-08 NOTE — Telephone Encounter (Signed)
Lansoprazole PA sent to pt's plan via CoverMyMeds.

## 2015-02-08 NOTE — Progress Notes (Signed)
   Subjective:   F/u neck pain, lyrica has helped 3/97 Ludy's alcoholic  husband moved back in - stressed   HPI  F/u allergies, GERD  F/u severe LBP - Percocet has helped a lot  The patient is here to follow up on chronic depression, anxiety, headaches and chronic moderate fibromyalgia symptoms notcontrolled with medicines; all joints hurt.  F/u fatigue - better  Wt Readings from Last 3 Encounters:  02/08/15 143 lb (64.864 kg)  12/07/14 141 lb (63.957 kg)  09/03/14 140 lb (63.504 kg)    BP Readings from Last 3 Encounters:  02/08/15 134/82  12/07/14 140/82  11/22/14 113/71      Review of Systems  Constitutional: Positive for fatigue. Negative for activity change, appetite change and unexpected weight change.  HENT: Negative for congestion and mouth sores.   Eyes: Negative for visual disturbance.  Respiratory: Negative for chest tightness.   Genitourinary: Negative for difficulty urinating and vaginal pain.  Musculoskeletal: Positive for arthralgias. Negative for gait problem.  Skin: Negative for pallor.  Neurological: Negative for tremors.  Psychiatric/Behavioral: Positive for sleep disturbance. Negative for suicidal ideas and confusion. The patient is nervous/anxious.        Objective:   Physical Exam  Constitutional: She appears well-developed. No distress.  HENT:  Head: Normocephalic.  Right Ear: External ear normal.  Left Ear: External ear normal.  Nose: Nose normal.  Mouth/Throat: Oropharynx is clear and moist.  Eyes: Conjunctivae are normal. Pupils are equal, round, and reactive to [place]. Right eye exhibits no discharge. Left eye exhibits no discharge.  Neck: Normal range of motion. Neck supple. No JVD present. No tracheal deviation present. No thyromegaly present.  Cardiovascular: Normal rate, regular rhythm and normal heart sounds.   Pulmonary/Chest: No stridor. No respiratory distress. She has no wheezes.  Abdominal: Soft. Bowel sounds are normal. She  exhibits no distension and no mass. There is no tenderness. There is no rebound and no guarding.  Musculoskeletal: She exhibits no edema or tenderness.  Lymphadenopathy:    She has no cervical adenopathy.  Neurological: She displays normal reflexes. No cranial nerve deficit. She exhibits normal muscle tone. Coordination normal.  Skin: No rash noted. No erythema.  Psychiatric: She has a normal mood and affect. Her behavior is normal. Judgment and thought content normal.  Achy w/ROM - all joints and neck   Lab Results  Component Value Date   WBC 5.5 09/03/2014   HGB 14.1 09/03/2014   HCT 43.3 09/03/2014   PLT 166.0 09/03/2014   GLUCOSE 79 09/03/2014   CHOL 186 09/03/2014   TRIG 64.0 09/03/2014   HDL 55.00 09/03/2014   LDLCALC 118* 09/03/2014   ALT 11 09/03/2014   AST 16 09/03/2014   NA 139 09/03/2014   K 3.9 09/03/2014   CL 104 09/03/2014   CREATININE 0.8 09/03/2014   BUN 12 09/03/2014   CO2 30 09/03/2014   TSH 3.69 09/03/2014         Assessment & Plan:

## 2015-02-08 NOTE — Assessment & Plan Note (Signed)
Seroquel, Xanax prn 

## 2015-02-08 NOTE — Assessment & Plan Note (Signed)
On B12 sq q 2 wks 

## 2015-02-08 NOTE — Progress Notes (Signed)
Pre visit review using our clinic review tool, if applicable. No additional management support is needed unless otherwise documented below in the visit note. 

## 2015-02-08 NOTE — Assessment & Plan Note (Signed)
Chronic, severe MSK 2/16 better on Percocet   Potential benefits of a long term opioids use as well as potential risks (i.e. addiction risk, apnea etc) and complications (i.e. Somnolence, constipation and others) were explained to the patient and were aknowledged.  Potential benefits of a long term NSAID  use as well as potential risks  and complications were explained to the patient and were aknowledged.

## 2015-03-30 ENCOUNTER — Other Ambulatory Visit: Payer: Self-pay | Admitting: Internal Medicine

## 2015-03-30 NOTE — Telephone Encounter (Signed)
Synthroid rx sent to pharm

## 2015-05-08 ENCOUNTER — Emergency Department (HOSPITAL_COMMUNITY)
Admission: EM | Admit: 2015-05-08 | Discharge: 2015-05-08 | Disposition: A | Discharge status: Home / Self Care | Payer: Medicare Other | Attending: Emergency Medicine | Admitting: Emergency Medicine

## 2015-05-08 ENCOUNTER — Emergency Department (HOSPITAL_COMMUNITY): Payer: Medicare Other

## 2015-05-08 ENCOUNTER — Encounter (HOSPITAL_COMMUNITY): Payer: Self-pay | Admitting: Emergency Medicine

## 2015-05-08 DIAGNOSIS — Z7952 Long term (current) use of systemic steroids: Secondary | ICD-10-CM | POA: Insufficient documentation

## 2015-05-08 DIAGNOSIS — J209 Acute bronchitis, unspecified: Secondary | ICD-10-CM | POA: Diagnosis not present

## 2015-05-08 DIAGNOSIS — Z79899 Other long term (current) drug therapy: Secondary | ICD-10-CM | POA: Insufficient documentation

## 2015-05-08 DIAGNOSIS — Z87891 Personal history of nicotine dependence: Secondary | ICD-10-CM | POA: Diagnosis not present

## 2015-05-08 DIAGNOSIS — G8929 Other chronic pain: Secondary | ICD-10-CM | POA: Insufficient documentation

## 2015-05-08 DIAGNOSIS — K219 Gastro-esophageal reflux disease without esophagitis: Secondary | ICD-10-CM | POA: Insufficient documentation

## 2015-05-08 DIAGNOSIS — R05 Cough: Secondary | ICD-10-CM | POA: Diagnosis not present

## 2015-05-08 DIAGNOSIS — F419 Anxiety disorder, unspecified: Secondary | ICD-10-CM | POA: Diagnosis not present

## 2015-05-08 DIAGNOSIS — E538 Deficiency of other specified B group vitamins: Secondary | ICD-10-CM | POA: Diagnosis not present

## 2015-05-08 DIAGNOSIS — F329 Major depressive disorder, single episode, unspecified: Secondary | ICD-10-CM | POA: Diagnosis not present

## 2015-05-08 DIAGNOSIS — Z8739 Personal history of other diseases of the musculoskeletal system and connective tissue: Secondary | ICD-10-CM | POA: Insufficient documentation

## 2015-05-08 DIAGNOSIS — Z7951 Long term (current) use of inhaled steroids: Secondary | ICD-10-CM | POA: Insufficient documentation

## 2015-05-08 DIAGNOSIS — J40 Bronchitis, not specified as acute or chronic: Secondary | ICD-10-CM | POA: Diagnosis not present

## 2015-05-08 MED ORDER — ALBUTEROL SULFATE (2.5 MG/3ML) 0.083% IN NEBU
2.5000 mg | INHALATION_SOLUTION | Freq: Once | RESPIRATORY_TRACT | Status: AC
Start: 2015-05-08 — End: 2015-05-08
  Administered 2015-05-08: 2.5 mg via RESPIRATORY_TRACT
  Filled 2015-05-08: qty 3

## 2015-05-08 MED ORDER — AZITHROMYCIN 250 MG PO TABS: 250.0000 mg | ORAL_TABLET | Freq: Every day | ORAL | Status: DC

## 2015-05-08 MED ORDER — AZITHROMYCIN 250 MG PO TABS
500.0000 mg | ORAL_TABLET | Freq: Once | ORAL | Status: AC
  Administered 2015-05-08: 500 mg via ORAL
  Filled 2015-05-08: qty 2

## 2015-05-08 MED ORDER — HYDROCOD POLST-CPM POLST ER 10-8 MG/5ML PO SUER
5.0000 mL | Freq: Once | ORAL | Status: AC
  Administered 2015-05-08: 5 mL via ORAL
  Filled 2015-05-08: qty 5

## 2015-05-08 MED ORDER — ALBUTEROL SULFATE (2.5 MG/3ML) 0.083% IN NEBU
2.5000 mg | INHALATION_SOLUTION | Freq: Once | RESPIRATORY_TRACT | Status: AC
  Administered 2015-05-08: 2.5 mg via RESPIRATORY_TRACT
  Filled 2015-05-08: qty 3

## 2015-05-08 MED ORDER — IPRATROPIUM-ALBUTEROL 0.5-2.5 (3) MG/3ML IN SOLN
3.0000 mL | Freq: Once | RESPIRATORY_TRACT | Status: AC
  Administered 2015-05-08: 3 mL via RESPIRATORY_TRACT
  Filled 2015-05-08: qty 3

## 2015-05-08 MED ORDER — GUAIFENESIN-CODEINE 100-10 MG/5ML PO SYRP
10.0000 mL | ORAL_SOLUTION | Freq: Three times a day (TID) | ORAL | Status: DC | PRN
Start: 2015-05-08 — End: 2015-08-25

## 2015-05-08 MED ORDER — PREDNISONE 20 MG PO TABS: 40.0000 mg | ORAL_TABLET | Freq: Every day | ORAL | Status: DC

## 2015-05-08 NOTE — ED Notes (Signed)
Patient c/o congested cough with sinus pressure x2 weeks. Per patient has used cough syrup and Mucinex D with no relief. Patient unsure of any fevers. Per patient headache and chest sore from coughing. Patient reports coughing up thick dark green sputum.

## 2015-05-08 NOTE — ED Provider Notes (Signed)
CSN: [id number]     Arrival date & time 05/08/15  1616 History   This chart was scribed for Kem Parkinson, PA-C working with Ezequiel Essex, MD by Mercy Moore, ED Scribe. This patient was seen in room APFT22/APFT22 and the patient's care was started at 5:01 PM.   Chief Complaint  Patient presents with  . Cough   The history is provided by the patient. No language interpreter was used.   HPI Comments: Melody [name] is a 59 y.o. female who presents to the Emergency Department complaining of URI like symptoms primarily including persistent, productive cough with thick dark green sputum, and sore throat with coughing, subjective fever, and headache ongoing for two weeks now. Wheezing and shortness of breath with excessive coughing, Patient reports treatment with OTC cough syrup and Mucinex D, without relief, despite finishing the bottles. Patient reports using her albuterol inhaler at home, but states that it hasn't been effective in improving her chest tightness, wheezing and shortness of breath. Patient shares that she has an upcoming appointment with her PCP in four days, but states that she couldn't wait that long for evaluation and treatment. Patient denies curent respiratory illnesses, but admits to history of tobacco use and bronchitis; patient quite smoking 1.5 years ago. She denies fever, bloody sputum, persistent shortness of breath, chest pain or vomiting.  Past Medical History  Diagnosis Date  . Depression     bipolar- Dr [name]  . Anxiety   . Low back pain     Dr Arnoldo Morale  . GERD (gastroesophageal reflux disease)   . Vitamin B12 deficiency   . Sinus congestion   . Vertigo   . Fibromyalgia   . Headache   . Chronic back pain   . Fatigue   . Chronic neck pain    Past Surgical History  Procedure Laterality Date  . Cervical laminectomy  2001 and 2995    Dr Arnoldo Morale   Family History  Problem Relation Age of Onset  . Arthritis Other   . Heart attack Father   . Cancer Mother     History  Substance Use Topics  . Smoking status: Former Smoker -- 0.50 packs/day    Types: Cigarettes    Quit date: 05/06/2011  . Smokeless tobacco: Never Used  . Alcohol Use: No   OB History    Gravida Para Term Preterm AB TAB SAB Ectopic Multiple Living   2 2 2       2      Review of Systems  Constitutional: Negative for fever, chills and appetite change.  HENT: Positive for congestion, sinus pressure and sore throat. Negative for trouble swallowing.   Respiratory: Positive for cough, chest tightness, shortness of breath and wheezing.   Cardiovascular: Negative for chest pain.  Gastrointestinal: Negative for nausea, vomiting and abdominal pain.  Genitourinary: Negative for dysuria.  Musculoskeletal: Negative for arthralgias and neck pain.  Skin: Negative for rash.  Neurological: Positive for headaches. Negative for dizziness, weakness and numbness.  Hematological: Negative for adenopathy.  All other systems reviewed and are negative.   Allergies  Doxycycline hyclate; Nabumetone; Sulfa antibiotics; Tetracycline hcl; and Tramadol hcl  Home Medications   Prior to Admission medications   Medication Sig Start Date End Date Taking? Authorizing Provider  Albuterol Sulfate (PROAIR RESPICLICK) 226 (90 BASE) MCG/ACT AEPB Inhale 1-2 puffs into the lungs 4 (four) times daily as needed. 09/03/14   Aleksei Plotnikov V, MD  ALPRAZolam Duanne Moron) 1 MG tablet Take 1 mg by mouth 3 (  three) times daily as needed for anxiety (*Make take an additional tablet as needed).     Historical Provider, MD  amphetamine-dextroamphetamine (ADDERALL) 30 MG tablet Take 30 mg by mouth every morning.    Historical Provider, MD  cetirizine (ZYRTEC) 10 MG tablet Take 10 mg by mouth daily.    Historical Provider, MD  cholecalciferol (VITAMIN D) 1000 UNITS tablet Take 2 tablets (2,000 Units total) by mouth daily. 12/07/14 12/07/15  Aleksei Plotnikov V, MD  cyanocobalamin (,VITAMIN B-12,) 1000 MCG/ML injection Inject 1 mL  (1,000 mcg total) into the muscle every 14 (fourteen) days. 02/08/15   Aleksei Plotnikov V, MD  eletriptan (RELPAX) 20 MG tablet One tablet by mouth as needed for migraine headache.  If the headache improves and then returns, dose may be repeated after 2 hours have elapsed since first dose (do not exceed 80 mg per day). may repeat in 2 hours if necessary     Historical Provider, MD  fluticasone (FLONASE) 50 MCG/ACT nasal spray USE 2 SPRAYS IN BOTH NOSTRILS DAILY. 02/08/15   Aleksei Plotnikov V, MD  levothyroxine (SYNTHROID, LEVOTHROID) 50 MCG tablet Take 1 tablet (50 mcg total) by mouth daily before breakfast. 02/08/15   Aleksei Plotnikov V, MD  levothyroxine (SYNTHROID, LEVOTHROID) 50 MCG tablet TAKE 1 TABLET BY MOUTH DAILY BEFORE BREAKFAST. 03/30/15   Aleksei Plotnikov V, MD  loratadine (CLARITIN) 10 MG tablet Take 1 tablet (10 mg total) by mouth daily. 02/08/15   Aleksei Plotnikov V, MD  oxyCODONE-acetaminophen (ROXICET) 5-325 MG per tablet Take 0.5-1 tablets by mouth 2 (two) times daily as needed for severe pain. Please fill on or after 04/10/15 02/08/15   Lew Dawes V, MD  pantoprazole (PROTONIX) 40 MG tablet Take 1 tablet (40 mg total) by mouth daily. 02/08/15   Aleksei Plotnikov V, MD  polyethylene glycol powder (GLYCOLAX/MIRALAX) powder Take 17 g by mouth daily. For constipation 12/14/11 08/05/12  Aleksei Plotnikov V, MD  PROAIR HFA 108 (90 BASE) MCG/ACT inhaler INHALE 2 PUFFS EVERY FOUR HOURS AS NEEDED 10/11/14   Aleksei Plotnikov V, MD  QUEtiapine (SEROQUEL) 25 MG tablet Take 12.5 mg by mouth at bedtime.     Historical Provider, MD  sennosides-docusate sodium (SENOKOT-S) 8.6-50 MG tablet Take 1 tablet by mouth daily as needed for constipation.     Historical Provider, MD  triamcinolone cream (KENALOG) 0.5 % Apply 1 application topically 3 (three) times daily. 03/09/14   Aleksei Plotnikov V, MD   Triage Vitals: BP 99/62 mmHg  Pulse 93  Temp(Src) 98.2 F (36.8 C) (Oral)  Resp 24  Ht 5\' 6"   (1.676 m)  Wt 140 lb (63.504 kg)  BMI 22.61 kg/m2  SpO2 100% Physical Exam  Constitutional: She is oriented to person, place, and time. She appears well-developed and well-nourished. No distress.  HENT:  Head: Normocephalic and atraumatic.  Right Ear: Tympanic membrane normal.  Left Ear: Tympanic membrane normal.  Mouth/Throat: Uvula is midline and mucous membranes are normal. Posterior oropharyngeal erythema present. No oropharyngeal exudate, posterior oropharyngeal edema or tonsillar abscesses.  Eyes: EOM are normal.  Neck: Normal range of motion. Neck supple. No tracheal deviation present.  Cardiovascular: Normal rate, regular rhythm, normal heart sounds and intact distal pulses.   No murmur heard. Pulmonary/Chest: Effort normal. No respiratory distress. She has wheezes. She has no rales.  Coarse lung sounds bilaterally with expiratory wheezes.   Abdominal: Soft. She exhibits no distension. There is no tenderness. There is no rebound.  Musculoskeletal: Normal range of motion.  Lymphadenopathy:    She has no cervical adenopathy.  Neurological: She is alert and oriented to person, place, and time.  Skin: Skin is warm and dry.  Psychiatric: She has a normal mood and affect. Her behavior is normal.  Nursing note and vitals reviewed.   ED Course  Procedures (including critical care time)  COORDINATION OF CARE: 5:08 PM- Discussed treatment plan with patient at bedside and patient agreed to plan.   Labs Review Labs Reviewed - No data to display  Imaging Review Dg Chest 2 View  05/08/2015   CLINICAL DATA:  Cough for several days.  EXAM: CHEST  2 VIEW  COMPARISON:  December 20, 2013.  FINDINGS: The heart size and mediastinal contours are within normal limits. Left lung is clear. Ill-defined reticular densities are noted in right lung base which may represent scarring or chronic inflammation. No pneumothorax or pleural effusion is noted. The visualized skeletal structures are  unremarkable.  IMPRESSION: Ill-defined reticular density seen in right lung base which may represent scarring or chronic inflammation, but acute inflammation cannot be excluded and follow-up radiographs are recommended.   Electronically Signed   By: Marijo Conception, M.D.   On: 05/08/2015 17:28     EKG Interpretation None      MDM   On reassessment after albuterol neb, patient continues to have scattered, expiratory wheezes. Will repeat the neb.   Final diagnoses:  Bronchitis    Vitals stable.  Lung sounds improved after two nebs.  Pt is feeling better and states is ready for d/c.  Agrees to close f/u with PMD and advised to return to er for any worsening sx's.  I personally performed the services described in this documentation, which was scribed in my presence. The recorded information has been reviewed and is accurate.    Kem Parkinson, PA-C 05/11/15 [place], MD 05/12/15 [phone]

## 2015-05-13 ENCOUNTER — Encounter: Payer: Self-pay | Admitting: Internal Medicine

## 2015-05-13 ENCOUNTER — Ambulatory Visit (INDEPENDENT_AMBULATORY_CARE_PROVIDER_SITE_OTHER): Payer: Medicare Other | Admitting: Internal Medicine

## 2015-05-13 VITALS — BP 130/84 | HR 96 | Temp 98.6°F | Ht 66.0 in | Wt 142.0 lb

## 2015-05-13 DIAGNOSIS — J449 Chronic obstructive pulmonary disease, unspecified: Secondary | ICD-10-CM

## 2015-05-13 DIAGNOSIS — M791 Myalgia: Secondary | ICD-10-CM

## 2015-05-13 DIAGNOSIS — E538 Deficiency of other specified B group vitamins: Secondary | ICD-10-CM

## 2015-05-13 DIAGNOSIS — E559 Vitamin D deficiency, unspecified: Secondary | ICD-10-CM

## 2015-05-13 DIAGNOSIS — E039 Hypothyroidism, unspecified: Secondary | ICD-10-CM

## 2015-05-13 DIAGNOSIS — IMO0001 Reserved for inherently not codable concepts without codable children: Secondary | ICD-10-CM

## 2015-05-13 DIAGNOSIS — M609 Myositis, unspecified: Secondary | ICD-10-CM

## 2015-05-13 MED ORDER — OXYCODONE-ACETAMINOPHEN 5-325 MG PO TABS: 0.5000 | ORAL_TABLET | Freq: Two times a day (BID) | ORAL | Status: DC | PRN

## 2015-05-13 MED ORDER — LEVOCETIRIZINE DIHYDROCHLORIDE 5 MG PO TABS: 5.0000 mg | ORAL_TABLET | Freq: Every evening | ORAL | Status: DC

## 2015-05-13 NOTE — Assessment & Plan Note (Signed)
Chronic On prn Percocet  Potential benefits of a long term opioids use as well as potential risks (i.e. addiction risk, apnea etc) and complications (i.e. Somnolence, constipation and others) were explained to the patient and were aknowledged.  Potential benefits of a long term NSAID use as well as potential risks  and complications were explained to the patient and were aknowledged.

## 2015-05-13 NOTE — Assessment & Plan Note (Signed)
On Levothroid 

## 2015-05-13 NOTE — Assessment & Plan Note (Signed)
On Vit D 

## 2015-05-13 NOTE — Progress Notes (Signed)
Subjective:  Patient ID: Melody [name], female    DOB: 18-Apr-1956  Age: 59 y.o. MRN: [id number]  CC: No chief complaint on file.   HPI Melody [name] presents for a f/u allergies, GERD, neck pain  F/u severe LBP - Percocet has helped a lot  The patient is here to follow up on chronic depression, anxiety, headaches and chronic moderate fibromyalgia symptoms.F/u fatigue - better.  Melody [name] just finished a Z pac today for URI  Outpatient Prescriptions Prior to Visit  Medication Sig Dispense Refill  . Albuterol Sulfate (PROAIR RESPICLICK) 270 (90 BASE) MCG/ACT AEPB Inhale 1-2 puffs into the lungs 4 (four) times daily as needed. 1 each 11  . ALPRAZolam (XANAX) 1 MG tablet Take 1 mg by mouth 4 (four) times daily.     Marland Kitchen amphetamine-dextroamphetamine (ADDERALL) 30 MG tablet Take 15-30 mg by mouth every morning. Takes one tablet in the morning, may take one-half tablet in the afternoon if needed    . azithromycin (ZITHROMAX) 250 MG tablet Take 1 tablet (250 mg total) by mouth daily. Take first 2 tablets together, then 1 every day until finished. 6 tablet 0  . cholecalciferol (VITAMIN D) 1000 UNITS tablet Take 2 tablets (2,000 Units total) by mouth daily. 100 tablet 3  . cyanocobalamin (,VITAMIN B-12,) 1000 MCG/ML injection Inject 1 mL (1,000 mcg total) into the muscle every 14 (fourteen) days. 10 mL 6  . eletriptan (RELPAX) 20 MG tablet One tablet by mouth as needed for migraine headache.  If the headache improves and then returns, dose may be repeated after 2 hours have elapsed since first dose (do not exceed 80 mg per day). may repeat in 2 hours if necessary     . fluticasone (FLONASE) 50 MCG/ACT nasal spray USE 2 SPRAYS IN BOTH NOSTRILS DAILY. (Patient taking differently: Place 2 sprays into both nostrils daily. USE 2 SPRAYS IN BOTH NOSTRILS DAILY.) 16 g 11  . guaiFENesin-codeine (ROBITUSSIN AC) 100-10 MG/5ML syrup Take 10 mLs by mouth 3 (three) times daily as needed. 120 mL 0  . levothyroxine  (SYNTHROID, LEVOTHROID) 50 MCG tablet Take 1 tablet (50 mcg total) by mouth daily before breakfast. 30 tablet 11  . loratadine (CLARITIN) 10 MG tablet Take 1 tablet (10 mg total) by mouth daily. 100 tablet 3  . oxyCODONE-acetaminophen (ROXICET) 5-325 MG per tablet Take 0.5-1 tablets by mouth 2 (two) times daily as needed for severe pain. Please fill on or after 04/10/15 (Patient taking differently: Take 0.5-1 tablets by mouth 2 (two) times daily as needed for moderate pain or severe pain. Please fill on or after 04/10/15) 60 tablet 0  . pantoprazole (PROTONIX) 40 MG tablet Take 1 tablet (40 mg total) by mouth daily. 30 tablet 11  . predniSONE (DELTASONE) 20 MG tablet Take 2 tablets (40 mg total) by mouth daily. For 5 days 10 tablet 0  . QUEtiapine (SEROQUEL) 25 MG tablet Take 25 mg by mouth at bedtime.     . triamcinolone cream (KENALOG) 0.5 % Apply 1 application topically 3 (three) times daily. 60 g 3  . polyethylene glycol powder (GLYCOLAX/MIRALAX) powder Take 17 g by mouth daily. For constipation     No facility-administered medications prior to visit.    ROS Review of Systems  Constitutional: Positive for chills. Negative for activity change, appetite change, fatigue and unexpected weight change.  HENT: Negative for congestion, mouth sores and sinus pressure.   Eyes: Negative for visual disturbance.  Respiratory: Positive for cough. Negative for  chest tightness.   Gastrointestinal: Negative for nausea and abdominal pain.  Genitourinary: Negative for frequency, difficulty urinating and vaginal pain.  Musculoskeletal: Positive for back pain, arthralgias and neck pain. Negative for gait problem.  Skin: Negative for pallor and rash.  Neurological: Negative for dizziness, tremors, weakness, numbness and headaches.  Psychiatric/Behavioral: Positive for decreased concentration. Negative for suicidal ideas, confusion and sleep disturbance. The patient is nervous/anxious.     Objective:  BP  130/84 mmHg  Pulse 96  Temp(Src) 98.6 F (37 C) (Oral)  Ht 5\' 6"  (1.676 m)  Wt 142 lb (64.411 kg)  BMI 22.93 kg/m2  SpO2 95%  BP Readings from Last 3 Encounters:  05/13/15 130/84  05/08/15 120/63  02/08/15 134/82    Wt Readings from Last 3 Encounters:  05/13/15 142 lb (64.411 kg)  05/08/15 140 lb (63.504 kg)  02/08/15 143 lb (64.864 kg)    Physical Exam  Constitutional: She appears well-developed. No distress.  HENT:  Head: Normocephalic.  Right Ear: External ear normal.  Left Ear: External ear normal.  Nose: Nose normal.  Mouth/Throat: No oropharyngeal exudate.  Eyes: Conjunctivae are normal. Pupils are equal, round, and reactive to Melody [name]. Right eye exhibits no discharge. Left eye exhibits no discharge.  Neck: Normal range of motion. Neck supple. No JVD present. No tracheal deviation present. No thyromegaly present.  Cardiovascular: Normal rate, regular rhythm and normal heart sounds.   Pulmonary/Chest: No stridor. No respiratory distress. She has no wheezes.  Abdominal: Soft. Bowel sounds are normal. She exhibits no distension and no mass. There is no tenderness. There is no rebound and no guarding.  Musculoskeletal: She exhibits no edema or tenderness.  Lymphadenopathy:    She has no cervical adenopathy.  Neurological: She displays normal reflexes. No cranial nerve deficit. She exhibits normal muscle tone. Coordination normal.  Skin: No rash noted. No erythema.  Psychiatric: She has a normal mood and affect. Her behavior is normal. Judgment and thought content normal.  Eryth throat  Lab Results  Component Value Date   WBC 5.5 09/03/2014   HGB 14.1 09/03/2014   HCT 43.3 09/03/2014   PLT 166.0 09/03/2014   GLUCOSE 79 09/03/2014   CHOL 186 09/03/2014   TRIG 64.0 09/03/2014   HDL 55.00 09/03/2014   LDLCALC 118* 09/03/2014   ALT 11 09/03/2014   AST 16 09/03/2014   NA 139 09/03/2014   K 3.9 09/03/2014   CL 104 09/03/2014   CREATININE 0.8 09/03/2014   BUN 12  09/03/2014   CO2 30 09/03/2014   TSH 3.69 09/03/2014    Dg Chest 2 View  05/08/2015   CLINICAL DATA:  Cough for several days.  EXAM: CHEST  2 VIEW  COMPARISON:  December 20, 2013.  FINDINGS: The heart size and mediastinal contours are within normal limits. Left lung is clear. Ill-defined reticular densities are noted in right lung base which may represent scarring or chronic inflammation. No pneumothorax or pleural effusion is noted. The visualized skeletal structures are unremarkable.  IMPRESSION: Ill-defined reticular density seen in right lung base which may represent scarring or chronic inflammation, but acute inflammation cannot be excluded and follow-up radiographs are recommended.   Electronically Signed   By: Marijo Conception, M.D.   On: 05/08/2015 17:28    Assessment & Plan:   There are no diagnoses linked to this encounter. I am having Ms. Gloeckner maintain her eletriptan, QUEtiapine, ALPRAZolam, polyethylene glycol powder, amphetamine-dextroamphetamine, triamcinolone cream, Albuterol Sulfate, cholecalciferol, levothyroxine, pantoprazole, oxyCODONE-acetaminophen, loratadine, fluticasone, cyanocobalamin,  azithromycin, guaiFENesin-codeine, and predniSONE.  No orders of the defined types were placed in this encounter.     Follow-up: No Follow-up on file.  Walker Kehr, MD

## 2015-05-13 NOTE — Assessment & Plan Note (Signed)
On B12 

## 2015-05-13 NOTE — Assessment & Plan Note (Signed)
Dg Chest 2 View  05/08/2015   CLINICAL DATA:  Cough for several days.  EXAM: CHEST  2 VIEW  COMPARISON:  December 20, 2013.  FINDINGS: The heart size and mediastinal contours are within normal limits. Left lung is clear. Ill-defined reticular densities are noted in right lung base which may represent scarring or chronic inflammation. No pneumothorax or pleural effusion is noted. The visualized skeletal structures are unremarkable.  IMPRESSION: Ill-defined reticular density seen in right lung base which may represent scarring or chronic inflammation, but acute inflammation cannot be excluded and follow-up radiographs are recommended.   Electronically Signed   By: Marijo Conception, M.D.   On: 05/08/2015 17:28   Repeat CXR in 2-3 mo Finished Predn, Z pac

## 2015-05-13 NOTE — Progress Notes (Signed)
Pre visit review using our clinic review tool, if applicable. No additional management support is needed unless otherwise documented below in the visit note. 

## 2015-07-28 ENCOUNTER — Other Ambulatory Visit: Payer: Self-pay | Admitting: Internal Medicine

## 2015-08-10 ENCOUNTER — Other Ambulatory Visit (HOSPITAL_COMMUNITY)
Admission: RE | Admit: 2015-08-10 | Discharge: 2015-08-10 | Disposition: A | Discharge status: Home / Self Care | Payer: Medicare Other | Source: Ambulatory Visit | Attending: Internal Medicine | Admitting: Internal Medicine

## 2015-08-10 ENCOUNTER — Encounter: Payer: Self-pay | Admitting: Internal Medicine

## 2015-08-10 ENCOUNTER — Ambulatory Visit (INDEPENDENT_AMBULATORY_CARE_PROVIDER_SITE_OTHER): Payer: Medicare Other | Admitting: Internal Medicine

## 2015-08-10 VITALS — BP 120/80 | HR 83 | Wt 145.0 lb

## 2015-08-10 DIAGNOSIS — Z124 Encounter for screening for malignant neoplasm of cervix: Secondary | ICD-10-CM | POA: Insufficient documentation

## 2015-08-10 DIAGNOSIS — N949 Unspecified condition associated with female genital organs and menstrual cycle: Secondary | ICD-10-CM | POA: Diagnosis not present

## 2015-08-10 DIAGNOSIS — E038 Other specified hypothyroidism: Secondary | ICD-10-CM

## 2015-08-10 DIAGNOSIS — N816 Rectocele: Secondary | ICD-10-CM | POA: Diagnosis not present

## 2015-08-10 DIAGNOSIS — E538 Deficiency of other specified B group vitamins: Secondary | ICD-10-CM

## 2015-08-10 MED ORDER — OXYCODONE-ACETAMINOPHEN 5-325 MG PO TABS: 0.5000 | ORAL_TABLET | Freq: Two times a day (BID) | ORAL | Status: DC | PRN

## 2015-08-10 NOTE — Patient Instructions (Signed)

## 2015-08-10 NOTE — Progress Notes (Signed)
Subjective:  Patient ID: Melody [name], female    DOB: April 03, 1956  Age: 59 y.o. MRN: [id number]  CC: No chief complaint on file.   HPI [name] presents for chronic pain, CFS f/m. Pt is c/o vaginal protrusion x 1 month. There is some stool incontinence present.  Outpatient Prescriptions Prior to Visit  Medication Sig Dispense Refill  . Albuterol Sulfate (PROAIR RESPICLICK) 194 (90 BASE) MCG/ACT AEPB Inhale 1-2 puffs into the lungs 4 (four) times daily as needed. 1 each 11  . ALPRAZolam (XANAX) 1 MG tablet Take 1 mg by mouth 4 (four) times daily.     Marland Kitchen amphetamine-dextroamphetamine (ADDERALL) 30 MG tablet Take 15-30 mg by mouth every morning. Takes one tablet in the morning, may take one-half tablet in the afternoon if needed    . azithromycin (ZITHROMAX) 250 MG tablet Take 1 tablet (250 mg total) by mouth daily. Take first 2 tablets together, then 1 every day until finished. 6 tablet 0  . cholecalciferol (VITAMIN D) 1000 UNITS tablet Take 2 tablets (2,000 Units total) by mouth daily. 100 tablet 3  . cyanocobalamin (,VITAMIN B-12,) 1000 MCG/ML injection Inject 1 mL (1,000 mcg total) into the muscle every 14 (fourteen) days. 10 mL 6  . cyanocobalamin (,VITAMIN B-12,) 1000 MCG/ML injection INJECT 1 ML INTO THE MUSCLE EVERY 14 DAYS. 2 mL 6  . eletriptan (RELPAX) 20 MG tablet One tablet by mouth as needed for migraine headache.  If the headache improves and then returns, dose may be repeated after 2 hours have elapsed since first dose (do not exceed 80 mg per day). may repeat in 2 hours if necessary     . fluticasone (FLONASE) 50 MCG/ACT nasal spray USE 2 SPRAYS IN BOTH NOSTRILS DAILY. (Patient taking differently: Place 2 sprays into both nostrils daily. USE 2 SPRAYS IN BOTH NOSTRILS DAILY.) 16 g 11  . guaiFENesin-codeine (ROBITUSSIN AC) 100-10 MG/5ML syrup Take 10 mLs by mouth 3 (three) times daily as needed. 120 mL 0  . levocetirizine (XYZAL) 5 MG tablet Take 1 tablet (5 mg total) by mouth  every evening. 90 tablet 3  . levothyroxine (SYNTHROID, LEVOTHROID) 50 MCG tablet Take 1 tablet (50 mcg total) by mouth daily before breakfast. 30 tablet 11  . pantoprazole (PROTONIX) 40 MG tablet Take 1 tablet (40 mg total) by mouth daily. 30 tablet 11  . predniSONE (DELTASONE) 20 MG tablet Take 2 tablets (40 mg total) by mouth daily. For 5 days 10 tablet 0  . QUEtiapine (SEROQUEL) 25 MG tablet Take 25 mg by mouth at bedtime.     . triamcinolone cream (KENALOG) 0.5 % Apply 1 application topically 3 (three) times daily. 60 g 3  . oxyCODONE-acetaminophen (ROXICET) 5-325 MG per tablet Take 0.5-1 tablets by mouth 2 (two) times daily as needed for severe pain. Please fill on or after 07/14/15 60 tablet 0  . polyethylene glycol powder (GLYCOLAX/MIRALAX) powder Take 17 g by mouth daily. For constipation     No facility-administered medications prior to visit.    ROS Review of Systems  Constitutional: Positive for fatigue. Negative for chills, activity change, appetite change and unexpected weight change.  HENT: Negative for congestion, mouth sores and sinus pressure.   Eyes: Negative for visual disturbance.  Respiratory: Negative for cough and chest tightness.   Cardiovascular: Negative for palpitations.  Gastrointestinal: Positive for diarrhea. Negative for nausea, vomiting, abdominal pain, constipation and blood in stool.  Genitourinary: Negative for frequency, decreased urine volume, difficulty urinating, genital  sores, vaginal pain and pelvic pain.  Musculoskeletal: Positive for back pain, arthralgias, neck pain and neck stiffness. Negative for gait problem.  Skin: Negative for pallor and rash.  Neurological: Positive for weakness and Keeny-headedness. Negative for dizziness, tremors, numbness and headaches.  Psychiatric/Behavioral: Positive for sleep disturbance and decreased concentration. Negative for confusion. The patient is nervous/anxious.     Objective:  BP 120/80 mmHg  Pulse 83   Wt 145 lb (65.772 kg)  SpO2 96%  BP Readings from Last 3 Encounters:  08/10/15 120/80  05/13/15 130/84  05/08/15 120/63    Wt Readings from Last 3 Encounters:  08/10/15 145 lb (65.772 kg)  05/13/15 142 lb (64.411 kg)  05/08/15 140 lb (63.504 kg)    Physical Exam  Constitutional: She appears well-developed. No distress.  HENT:  Head: Normocephalic.  Right Ear: External ear normal.  Left Ear: External ear normal.  Nose: Nose normal.  Mouth/Throat: Oropharynx is clear and moist.  Eyes: Conjunctivae are normal. Pupils are equal, round, and reactive to [place]. Right eye exhibits no discharge. Left eye exhibits no discharge.  Neck: Normal range of motion. Neck supple. No JVD present. No tracheal deviation present. No thyromegaly present.  Cardiovascular: Normal rate, regular rhythm and normal heart sounds.   Pulmonary/Chest: No stridor. No respiratory distress.  Abdominal: Soft. Bowel sounds are normal. She exhibits no distension and no mass. There is no tenderness. There is no rebound and no guarding.  Genitourinary: Vagina normal and uterus normal. Guaiac negative stool. No vaginal discharge found.  Musculoskeletal: She exhibits no edema or tenderness.  Lymphadenopathy:    She has no cervical adenopathy.  Neurological: She displays normal reflexes. No cranial nerve deficit. She exhibits normal muscle tone. Coordination normal.  Skin: No rash noted. No erythema.  Psychiatric: She has a normal mood and affect. Her behavior is normal. Judgment and thought content normal.  Rectocele is present  Stool G(-)  Lab Results  Component Value Date   WBC 5.5 09/03/2014   HGB 14.1 09/03/2014   HCT 43.3 09/03/2014   PLT 166.0 09/03/2014   GLUCOSE 79 09/03/2014   CHOL 186 09/03/2014   TRIG 64.0 09/03/2014   HDL 55.00 09/03/2014   LDLCALC 118* 09/03/2014   ALT 11 09/03/2014   AST 16 09/03/2014   NA 139 09/03/2014   K 3.9 09/03/2014   CL 104 09/03/2014   CREATININE 0.8 09/03/2014    BUN 12 09/03/2014   CO2 30 09/03/2014   TSH 3.69 09/03/2014    Dg Chest 2 View  05/08/2015  CLINICAL DATA:  Cough for several days. EXAM: CHEST  2 VIEW COMPARISON:  December 20, 2013. FINDINGS: The heart size and mediastinal contours are within normal limits. Left lung is clear. Ill-defined reticular densities are noted in right lung base which may represent scarring or chronic inflammation. No pneumothorax or pleural effusion is noted. The visualized skeletal structures are unremarkable. IMPRESSION: Ill-defined reticular density seen in right lung base which may represent scarring or chronic inflammation, but acute inflammation cannot be excluded and follow-up radiographs are recommended. Electronically Signed   By: Marijo Conception, M.D.   On: 05/08/2015 17:28    Assessment & Plan:   Diagnoses and all orders for this visit:  Vaginal discomfort -     Ambulatory referral to Gynecology -     Cytology - PAP  B12 deficiency  Other specified hypothyroidism  Rectocele -     Ambulatory referral to Gynecology  Pap smear for cervical cancer screening -  Cytology - PAP  Other orders -     Discontinue: oxyCODONE-acetaminophen (ROXICET) 5-325 MG tablet; Take 0.5-1 tablets by mouth 2 (two) times daily as needed for severe pain. Please fill on or after 08/13/15 -     Discontinue: oxyCODONE-acetaminophen (ROXICET) 5-325 MG tablet; Take 0.5-1 tablets by mouth 2 (two) times daily as needed for severe pain. Please fill on or after 09/13/15 -     oxyCODONE-acetaminophen (ROXICET) 5-325 MG tablet; Take 0.5-1 tablets by mouth 2 (two) times daily as needed for severe pain. Please fill on or after 10/13/15   I have discontinued Ms. Sundeen's oxyCODONE-acetaminophen and oxyCODONE-acetaminophen. I have also changed her oxyCODONE-acetaminophen. Additionally, I am having her maintain her eletriptan, QUEtiapine, ALPRAZolam, polyethylene glycol powder, amphetamine-dextroamphetamine, triamcinolone cream,  Albuterol Sulfate, cholecalciferol, levothyroxine, pantoprazole, fluticasone, cyanocobalamin, azithromycin, guaiFENesin-codeine, predniSONE, levocetirizine, and cyanocobalamin.  Meds ordered this encounter  Medications  . DISCONTD: oxyCODONE-acetaminophen (ROXICET) 5-325 MG tablet    Sig: Take 0.5-1 tablets by mouth 2 (two) times daily as needed for severe pain. Please fill on or after 08/13/15    Dispense:  60 tablet    Refill:  0  . DISCONTD: oxyCODONE-acetaminophen (ROXICET) 5-325 MG tablet    Sig: Take 0.5-1 tablets by mouth 2 (two) times daily as needed for severe pain. Please fill on or after 09/13/15    Dispense:  60 tablet    Refill:  0  . oxyCODONE-acetaminophen (ROXICET) 5-325 MG tablet    Sig: Take 0.5-1 tablets by mouth 2 (two) times daily as needed for severe pain. Please fill on or after 10/13/15    Dispense:  60 tablet    Refill:  0     Follow-up: Return in about 3 months (around 11/10/2015) for a follow-up visit.  Walker Kehr, MD

## 2015-08-10 NOTE — Assessment & Plan Note (Signed)
GYN ref Dr Silva 

## 2015-08-10 NOTE — Assessment & Plan Note (Signed)
On B12 

## 2015-08-10 NOTE — Assessment & Plan Note (Signed)
On Levothroid 

## 2015-08-10 NOTE — Progress Notes (Signed)
Pre visit review using our clinic review tool, if applicable. No additional management support is needed unless otherwise documented below in the visit note. 

## 2015-08-11 ENCOUNTER — Telehealth: Payer: Self-pay | Admitting: Obstetrics and Gynecology

## 2015-08-11 NOTE — Telephone Encounter (Signed)
Called and left a message for patient to call back to schedule a new patient doctor referral. °

## 2015-08-12 LAB — CYTOLOGY - PAP

## 2015-08-15 ENCOUNTER — Ambulatory Visit: Payer: Medicare Other | Admitting: Internal Medicine

## 2015-08-25 ENCOUNTER — Ambulatory Visit (INDEPENDENT_AMBULATORY_CARE_PROVIDER_SITE_OTHER): Payer: Medicare Other | Admitting: Obstetrics and Gynecology

## 2015-08-25 ENCOUNTER — Encounter: Payer: Self-pay | Admitting: Obstetrics and Gynecology

## 2015-08-25 VITALS — BP 130/74 | HR 80 | Resp 18 | Ht 65.75 in | Wt 145.0 lb

## 2015-08-25 DIAGNOSIS — N3946 Mixed incontinence: Secondary | ICD-10-CM

## 2015-08-25 DIAGNOSIS — N812 Incomplete uterovaginal prolapse: Secondary | ICD-10-CM

## 2015-08-25 DIAGNOSIS — R159 Full incontinence of feces: Secondary | ICD-10-CM | POA: Diagnosis not present

## 2015-08-25 NOTE — Patient Instructions (Signed)
Try the metamucil every other day to help with your bowel control.

## 2015-08-25 NOTE — Progress Notes (Signed)
Patient ID: Melody [name], female   DOB: 1956-10-08, 59 y.o.   MRN: [id number] 59 y.o. [id number] Married Caucasian female here for evaluation of possible rectocele and cystocle.  Patient complains of fecal incontinence.  Noticed a bulge when had intercourse the last time, which was 6 months ago. This is progressive.  Feeling pressure.   Having fecal soiling.  No perineal or vaginal splinting.  Was on Sen[place]ot but has not needed for the last few months.  (Takes narcotics for osteoarthritis.)   Some urgency with leakage.  Leaks urine with cough, laugh, and sneeze, but not all the time.  No known leakage of urine with lifting or exercise.   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  Hx of 2 vaginal deliveries.  Largest was 8 pound, 2 ounces.  No known third or fourth degree lacerations.  No known forceps deliveries.   Separated from husband for 3 months.  Married and 2 children.  2 great grandchildren.   PCP:  Walker Kehr, MD  Patient's last menstrual period was 10/29/2006 (approximate).      No HRT but does have some hot flashes.     Sexually active: No. separated from husband The current method of family planning is tubal ligation.    Exercising: No.   Smoker:  Former, quit 2013  Health Maintenance: Pap:  08-10-15 Neg:no HR HPV done History of abnormal Pap:  no MMG:  ?greater than 5 years RSW:NIOEVO.  Not scheduled and not planning on doing another one.  Colonoscopy:  Thinks she had one with [place] and had benign colon polyps:unsure of date or doctor. BMD:   n/a  Result  n/a TDaP:  08-01-15   reports that she quit smoking about 4 years ago. Her smoking use included Cigarettes. She smoked 0.50 packs per day. She has never used smokeless tobacco. She reports that she does not drink alcohol or use illicit drugs.  Past Medical History  Diagnosis Date  . Depression     bipolar- Dr [name]  . Anxiety   . Low back pain     Dr Arnoldo Morale  . GERD (gastroesophageal reflux disease)    . Vitamin B12 deficiency   . Sinus congestion   . Vertigo   . Fibromyalgia   . Headache   . Chronic back pain   . Fatigue   . Chronic neck pain   . Thyroid disease     hypothyroidsm  . Osteoarthritis     Past Surgical History  Procedure Laterality Date  . Cervical laminectomy  2001 and 2995    Dr Arnoldo Morale  . Tubal ligation  1980    Current Outpatient Prescriptions  Medication Sig Dispense Refill  . Albuterol Sulfate (PROAIR RESPICLICK) 350 (90 BASE) MCG/ACT AEPB Inhale 1-2 puffs into the lungs 4 (four) times daily as needed. 1 each 11  . ALPRAZolam (XANAX) 1 MG tablet Take 1 mg by mouth 4 (four) times daily.     Marland Kitchen amphetamine-dextroamphetamine (ADDERALL) 30 MG tablet Take 15-30 mg by mouth every morning. Takes one tablet in the morning, may take one-half tablet in the afternoon if needed    . cholecalciferol (VITAMIN D) 1000 UNITS tablet Take 2 tablets (2,000 Units total) by mouth daily. 100 tablet 3  . cyanocobalamin (,VITAMIN B-12,) 1000 MCG/ML injection Inject 1 mL (1,000 mcg total) into the muscle every 14 (fourteen) days. 10 mL 6  . cyanocobalamin (,VITAMIN B-12,) 1000 MCG/ML injection INJECT 1 ML INTO THE MUSCLE EVERY 14 DAYS. 2 mL  6  . eletriptan (RELPAX) 20 MG tablet One tablet by mouth as needed for migraine headache.  If the headache improves and then returns, dose may be repeated after 2 hours have elapsed since first dose (do not exceed 80 mg per day). may repeat in 2 hours if necessary     . fluticasone (FLONASE) 50 MCG/ACT nasal spray USE 2 SPRAYS IN BOTH NOSTRILS DAILY. (Patient taking differently: Place 2 sprays into both nostrils daily. USE 2 SPRAYS IN BOTH NOSTRILS DAILY.) 16 g 11  . levocetirizine (XYZAL) 5 MG tablet Take 1 tablet (5 mg total) by mouth every evening. 90 tablet 3  . levothyroxine (SYNTHROID, LEVOTHROID) 50 MCG tablet Take 1 tablet (50 mcg total) by mouth daily before breakfast. 30 tablet 11  . oxyCODONE-acetaminophen (ROXICET) 5-325 MG tablet Take  0.5-1 tablets by mouth 2 (two) times daily as needed for severe pain. Please fill on or after 10/13/15 60 tablet 0  . pantoprazole (PROTONIX) 40 MG tablet Take 1 tablet (40 mg total) by mouth daily. 30 tablet 11  . QUEtiapine (SEROQUEL) 25 MG tablet Take 25 mg by mouth at bedtime.     . triamcinolone cream (KENALOG) 0.5 % Apply 1 application topically 3 (three) times daily. 60 g 3   No current facility-administered medications for this visit.    Family History  Problem Relation Age of Onset  . Arthritis Other   . Heart attack Father     dec heart disease age 26  . Hypertension Father   . Cancer Mother     dec stomach ca--age 41  . Breast cancer Paternal Aunt   . Breast cancer Cousin     ROS:  Pertinent items are noted in HPI.  Otherwise, a comprehensive ROS was negative.  Exam:   BP 130/74 mmHg  Pulse 80  Resp 18  Ht 5' 5.75" (1.67 m)  Wt 145 lb (65.772 kg)  BMI 23.58 kg/m2  LMP 10/29/2006 (Approximate)    General appearance: alert, cooperative and appears stated age Head: Normocephalic, without obvious abnormality, atraumatic Lungs: clear to auscultation bilaterally Heart: regular rate and rhythm Abdomen: soft, non-tender; bowel sounds normal; no masses,  no organomegaly    Pelvic: External genitalia:  no lesions              Urethra:  normal appearing urethra with no masses, tenderness or lesions              Bartholins and Skenes: normal                 Vagina: normal appearing vagina with normal color and discharge, no lesions.  Second degree cystocele, first degree uterine prolapse, no real rectocele.               Cervix: no lesions                Bimanual Exam:  Uterus:  normal size, contour, position, consistency, mobility, non-tender              Adnexa: normal adnexa and no mass, fullness, tenderness              Rectovaginal: Yes.  .  Confirms.              Anus:  normal sphincter tone, no lesions  Chaperone was present for exam.  Assessment:   Well woman  visit with normal exam. Incomplete uterovaginal prolapse.  Mixed urinary incontinence.  Fecal incontinence.  Overdue for mammogram.   Plan:  I have had a comprehensive discussion with the patient regarding prolapse and urinary incontinence.  I have provided reading materials from [employer] regarding prolapse and incontinence in general as well as medical and surgical treatment for these conditions.   She understands she may do observational management.  Medical treatments may include physical therapy, anticholinergics/antimuscarinics, and pessary use.  We discussed surgical care including a hysterectomy with a cystocele repair. Patient may also need a midurethral sling and anal sphincter surgery if she were to have a defect.  We discussed that the surgery would be a major procedure requiring 3 months of recovery during which time she could not lift over 10 pounds.   We also discussed recurrence of prolapse and incontinence if she continues with her heavy physical labor.   Will return for a pessary fitting.  Discussed Metamucil to improve fecal incontinence.  I also discussed referral to a colon and rectal surgeon for evaluation of fecal incontinence.  She will consider.   I have recommended a mammogram for breast cancer screening.   After visit summary provided.   __30_____ minutes face to face time of which over 50% was spent in counseling.

## 2015-08-29 ENCOUNTER — Telehealth: Payer: Self-pay | Admitting: Obstetrics and Gynecology

## 2015-08-29 ENCOUNTER — Telehealth: Payer: Self-pay | Admitting: Internal Medicine

## 2015-08-29 NOTE — Telephone Encounter (Signed)
Patient is has a pessary fitting appointment 09/07/15. Patient thinks she may want to have surgery instead. Patient did not want to cancel pessary fitting appointment until she talks with a nurse. Last seen 08/25/15.
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
## 2015-08-29 NOTE — Telephone Encounter (Signed)
Spoke with patient. Patient states that she may want to proceed with hysterectomy with cystocele repair. May need midurethral sling and sphincter repair. Patient would like to know the estimated cost before proceeding. Advised I will let Dr.Silva know so this may be checked. Advised once this has been checked the patient will be notified of cost and we will proceed from there. Patient is agreeable.  Cc: Theresia Lo

## 2015-08-29 NOTE — Telephone Encounter (Signed)
Patient would need to have urodynamic testing performed and further conversation to determine all of her surgical needs.   We did not really do a detailed discussion regarding surgery, and we did not discuss risks and benefits of surgery.  Her surgery could include a possible laparoscopically assisted vaginal hysterectomy with bilateral salpingo-oophorectomy, anterior and posterior colporrhaphy, midurethral sling and cystoscopy.   I will ask [name] to contact patient further about surgery  I feel that the patient needs a return visit to explore this further.   [place]

## 2015-08-29 NOTE — Telephone Encounter (Signed)
Encounter closed in error.

## 2015-08-30 MED ORDER — OXYCODONE-ACETAMINOPHEN 5-325 MG PO TABS: 0.5000 | ORAL_TABLET | Freq: Two times a day (BID) | ORAL | Status: DC | PRN

## 2015-08-30 NOTE — Telephone Encounter (Signed)
Pt states she has not filled Oxy/APAP 5/325 last on 07/20/15. However, she is not out at the moment.  I printed scripts for 08/30/2015 and 09/29/2015. Rxs upfront. Pt informed

## 2015-08-30 NOTE — Telephone Encounter (Signed)
Ok to replace Rx Thx

## 2015-08-31 ENCOUNTER — Telehealth: Payer: Self-pay | Admitting: Obstetrics and Gynecology
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
## 2015-08-31 NOTE — Telephone Encounter (Signed)
Routing to Lamont Snowball, RN for review and advise. Please see telephone encounter from 08/29/2015 sent by Dr.Silva.
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
## 2015-09-07 ENCOUNTER — Ambulatory Visit: Payer: Medicare Other | Admitting: Obstetrics and Gynecology

## 2015-09-19 ENCOUNTER — Ambulatory Visit (INDEPENDENT_AMBULATORY_CARE_PROVIDER_SITE_OTHER): Payer: Medicare Other | Admitting: Obstetrics and Gynecology

## 2015-09-19 ENCOUNTER — Encounter: Payer: Self-pay | Admitting: Obstetrics and Gynecology

## 2015-09-19 VITALS — BP 130/82 | HR 86 | Resp 16 | Ht 65.75 in | Wt 146.0 lb

## 2015-09-19 DIAGNOSIS — N812 Incomplete uterovaginal prolapse: Secondary | ICD-10-CM

## 2015-09-19 DIAGNOSIS — N393 Stress incontinence (female) (male): Secondary | ICD-10-CM | POA: Diagnosis not present

## 2015-09-19 NOTE — Progress Notes (Signed)
GYNECOLOGY  VISIT   HPI: 59 y.o.   Married  Caucasian  female   G2P2002 with Patient's last menstrual period was 10/29/2006 (approximate).   here for Consult vaginal prolapse surgery options   Has uterovaginal prolapse, leakage of urine and fecal incontinence.  Metamucil made the fecal incontinence worse.  Was having a lot of bowel movements. Stopped Metamucil. Bowel function is now much more normal.  Tired of doing nothing at home.  Not exercising.  Usually chops and lifts wood at home.   Lives 60 miles away from the office.   GYNECOLOGIC HISTORY: Patient's last menstrual period was 10/29/2006 (approximate). Contraception: Post Menopausal  Menopausal hormone therapy: none Last mammogram: ? Last pap smear: 08/10/15 Neg        OB History    Gravida Para Term Preterm AB TAB SAB Ectopic Multiple Living   2 2 2       2          Patient Active Problem List   Diagnosis Date Noted  . Vaginal discomfort 08/10/2015  . Rectocele 08/10/2015  . Well adult exam 09/03/2014  . Chronic obstructive airway disease with asthma ([place]) 09/03/2014  . UTI (urinary tract infection) 06/09/2014  . Dysplastic toenail 09/02/2013  . Aphthous ulcer 06/11/2013  . Dark urine 03/18/2013  . Rash 06/08/2012  . PRURITUS 12/01/2010  . EXPOSURE TO HAZARDOUS BODY FLUID, HX OF 05/29/2010  . TOBACCO USER 02/24/2010  . BRONCHITIS, ACUTE 12/05/2009  . HIP PAIN 12/05/2009  . Acute sinusitis 09/02/2009  . RUQ PAIN 02/04/2009  . WRIST PAIN 05/24/2008  . CERVICAL STRAIN 05/24/2008  . WARTS, VIRAL, UNSPECIFIED 04/06/2008  . NEOPLASM, SKIN, UNCERTAIN BEHAVIOR 0000000  . Vitamin D deficiency 11/13/2007  . UPPER RESPIRATORY INFECTION (URI) 11/11/2007  . Hypothyroidism 08/05/2007  . BIPOLAR DEPRESSION 08/05/2007  . GERD 08/05/2007  . Anxiety state 07/23/2007  . LOW BACK PAIN 07/23/2007  . Myalgia and myositis 07/23/2007  . B12 deficiency 05/22/2007  . IRON DEFICIENCY 05/22/2007    Past Medical History   Diagnosis Date  . Depression     bipolar- Dr [name]  . Anxiety   . Low back pain     Dr Arnoldo Morale  . GERD (gastroesophageal reflux disease)   . Vitamin B12 deficiency   . Sinus congestion   . Vertigo   . Fibromyalgia   . Headache   . Chronic back pain   . Fatigue   . Chronic neck pain   . Thyroid disease     hypothyroidsm  . Osteoarthritis     Past Surgical History  Procedure Laterality Date  . Cervical laminectomy  2001 and 2995    Dr Arnoldo Morale  . Tubal ligation  1980    Current Outpatient Prescriptions  Medication Sig Dispense Refill  . Albuterol Sulfate (PROAIR RESPICLICK) 123XX123 (90 BASE) MCG/ACT AEPB Inhale 1-2 puffs into the lungs 4 (four) times daily as needed. 1 each 11  . ALPRAZolam (XANAX) 1 MG tablet Take 1 mg by mouth 4 (four) times daily.     Marland Kitchen amphetamine-dextroamphetamine (ADDERALL) 30 MG tablet Take 15-30 mg by mouth every morning. Takes one tablet in the morning, may take one-half tablet in the afternoon if needed    . cholecalciferol (VITAMIN D) 1000 UNITS tablet Take 2 tablets (2,000 Units total) by mouth daily. 100 tablet 3  . cyanocobalamin (,VITAMIN B-12,) 1000 MCG/ML injection INJECT 1 ML INTO THE MUSCLE EVERY 14 DAYS. 2 mL 6  . eletriptan (RELPAX) 20 MG tablet One tablet  by mouth as needed for migraine headache.  If the headache improves and then returns, dose may be repeated after 2 hours have elapsed since first dose (do not exceed 80 mg per day). may repeat in 2 hours if necessary     . fluticasone (FLONASE) 50 MCG/ACT nasal spray USE 2 SPRAYS IN BOTH NOSTRILS DAILY. (Patient taking differently: Place 2 sprays into both nostrils daily. USE 2 SPRAYS IN BOTH NOSTRILS DAILY.) 16 g 11  . levocetirizine (XYZAL) 5 MG tablet Take 1 tablet (5 mg total) by mouth every evening. 90 tablet 3  . levothyroxine (SYNTHROID, LEVOTHROID) 50 MCG tablet Take 1 tablet (50 mcg total) by mouth daily before breakfast. 30 tablet 11  . oxyCODONE-acetaminophen (ROXICET) 5-325 MG tablet  Take 0.5-1 tablets by mouth 2 (two) times daily as needed for severe pain. Please fill on or after 09/29/2015 60 tablet 0  . pantoprazole (PROTONIX) 40 MG tablet Take 1 tablet (40 mg total) by mouth daily. 30 tablet 11  . PROAIR HFA 108 (90 BASE) MCG/ACT inhaler     . QUEtiapine (SEROQUEL) 25 MG tablet Take 25 mg by mouth at bedtime.     . triamcinolone cream (KENALOG) 0.5 % Apply 1 application topically 3 (three) times daily. 60 g 3   No current facility-administered medications for this visit.     ALLERGIES: Doxycycline hyclate; Nabumetone; Sulfa antibiotics; Tetracycline hcl; and Tramadol hcl  Family History  Problem Relation Age of Onset  . Arthritis Other   . Heart attack Father     dec heart disease age 40  . Hypertension Father   . Cancer Mother     dec stomach ca--age 77  . Breast cancer Paternal Aunt   . Breast cancer Cousin     Social History   Social History  . Marital Status: Married    Spouse Name: N/A  . Number of Children: N/A  . Years of Education: N/A   Occupational History  . Not on file.   Social History Main Topics  . Smoking status: Former Smoker -- 0.50 packs/day    Types: Cigarettes    Quit date: 05/06/2011  . Smokeless tobacco: Never Used  . Alcohol Use: No  . Drug Use: No  . Sexual Activity: Yes    Birth Control/ Protection: Post-menopausal, Surgical     Comment: Tubal   Other Topics Concern  . Not on file   Social History Narrative    ROS:  Pertinent items are noted in HPI.  PHYSICAL EXAMINATION:    BP 130/82 mmHg  Pulse 86  Resp 16  Ht 5' 5.75" (1.67 m)  Wt 146 lb (66.225 kg)  BMI 23.75 kg/m2  LMP 10/29/2006 (Approximate)    General appearance: alert, cooperative and appears stated age   Pelvic: External genitalia:  no lesions              Urethra:  normal appearing urethra with no masses, tenderness or lesions              Bartholins and Skenes: normal                 Vagina: normal appearing vagina with normal color and  discharge, no lesions.  Third degree cystocele.  Almost second degree uterine prolapse, first degree rectocele.              Cervix: no lesions          Bimanual Exam:  Uterus:  normal size, contour, position, consistency, mobility,  non-tender              Adnexa: normal adnexa and no mass, fullness, tenderness              Rectovaginal: Yes.  .  Confirms.              Anus:  normal sphincter tone, no lesions  Chaperone was present for exam.  ASSESSMENT  Incomplete uterovaginal prolapse.  Genuine stress incontinence.  Fecal incontinence.  PLAN  I have had a comprehensive discussion with the patient regarding prolapse and urinary incontinence.  I have provided reading materials from ACOG regarding prolapse and incontinence in general as well as medical and surgical treatment for these conditions.    We discussed routes to surgery including: - abdominal hysterectomy/bilateral salpingo-oophorectomy with sacrocolpopexy with permanent graft, anterior and posterior colporrhaphy, and TVT midurethral sling.  Patient understands that this can be performed laparoscopically and that I do not perform this procedure but can send her to another provider who does.   - laparoscopically assisted vaginal hysterectomy with bilateral salpingo-oophorectomy, anterior and posterior colporrhaphy, TVT midurethral sling, and likely sacrospinous fixation.  We discussed benefits and risks of surgery which include but are not limited to bleeding, infection, damage to surrounding organs, ureteral damage, vaginal pain with intercourse, permanent mesh use which may cause erosion and exposure in the vagina, urethra, bladder or ureters, dyspareunia, slower voiding and urinary retention, possible need for prolonged catheterization and/or self catheterization, de novo overactive bladder symptoms, reoperation, recurrence of prolapse and incontinence,  DVT, PE, death, and reaction to anesthesia.    I have discussed surgical  expectations regarding the procedures and success rates, outcomes, and recovery.     Will proceed with urodynamic testing with reduction of the prolapse.  Patient also understands that fecal incontinence may need to be treated by a colon and rectal surgeon at a separate surgical intervention if it persists post op.  An After Visit Summary was printed and given to the patient.  __25____ minutes face to face time of which over 50% was spent in counseling.

## 2015-09-26 ENCOUNTER — Telehealth: Payer: Self-pay | Admitting: Obstetrics and Gynecology

## 2015-09-26 DIAGNOSIS — N816 Rectocele: Secondary | ICD-10-CM

## 2015-09-26 DIAGNOSIS — N393 Stress incontinence (female) (male): Secondary | ICD-10-CM

## 2015-09-26 NOTE — Telephone Encounter (Signed)
Called patient to review benefit for urodynamic testing. Patient understood benefit but has concerns and is unable to schedule at this time. Provided options, patient unable to schedule.  Routing to S.Orvan Seen for review.

## 2015-09-26 NOTE — Telephone Encounter (Signed)
Patient returned call an is requesting to speak with [facility].

## 2015-09-26 NOTE — Telephone Encounter (Signed)
Reviewed patients insurance information with administration. Updated benefits based on information and contacted patient. Patient understands and agreeable. Ready to schedule. Patient aware next urodynamics testing date is 10/12/15. Patient stated Dr Quincy Simmonds discussed with her specific scheduled based on patients transportation to office (she lives 69 miles from office). Patient stated "Dr Quincy Simmonds told me she wanted to be here since I have to drive so far". Advised patient our nurse would call to schedule and review instructions and she would be able to discuss that information during her call. Patient agreeable and requests call tomorrow 09/27/15.  Routing to nurse for review and scheduling.

## 2015-09-27 ENCOUNTER — Encounter: Payer: Self-pay | Admitting: Emergency Medicine

## 2015-09-27 NOTE — Telephone Encounter (Signed)
Spoke with patient and discussed urodynamics and surgical planning. Patient states she lives alone and has to heat her own home with wood and carries wood for the fire. She is really thinking about the best time to plan surgery. Discussed that urodynamics will need to be completed no more than 6 months before surgery. Patient states she is ready to proceed with surgical planning and wants to move forward with 10/12/15 urodynamics. Discussed that after urodynamics a consult with Dr. Quincy Simmonds is scheduled, then can plan surgical date. Then will need pre-op appointment with Dr. Quincy Simmonds and Pre-op appointment at the hospital. Patient states she will work on finding family members who can help with her care for post op recovery.  Urodynamics instructions given.  Scheduled urodynamics procedure for 10/12/15. Advised to stop all bladder medications one week prior to procedure, patient states she is not on any medications. Arrive with comfortably full bladder.  Advised will need pre procedure UA to check for any infection prior. Scheduled for 10/07/15.  Scheduled follow up with Dr. Quincy Simmonds for 10/18/15 at 1245.  Brief description of procedure given. 22 minutes total phone call with patient discussed urodynamics and surgical planning.  Patient verbalizes understanding of instructions and agreeable to appointments as scheduled. Advised to call back with any questions.   Letter with instructions and appointment information created in letter section and mailed to patient home address of record.  Surgical information sheet from [facility] mailed as well.    Cc Lamont Snowball, RN

## 2015-10-04 NOTE — Addendum Note (Signed)
Addended by: Michele Mcalpine on: 10/04/2015 10:06 AM   Modules accepted: Orders

## 2015-10-07 ENCOUNTER — Ambulatory Visit (INDEPENDENT_AMBULATORY_CARE_PROVIDER_SITE_OTHER): Payer: Medicare Other | Admitting: *Deleted

## 2015-10-07 DIAGNOSIS — N393 Stress incontinence (female) (male): Secondary | ICD-10-CM | POA: Diagnosis not present

## 2015-10-07 LAB — POCT URINALYSIS DIPSTICK
Bilirubin, UA: NEGATIVE
Blood, UA: NEGATIVE
GLUCOSE UA: NEGATIVE
Ketones, UA: NEGATIVE
Leukocytes, UA: NEGATIVE
NITRITE UA: NEGATIVE
Protein, UA: NEGATIVE
Urobilinogen, UA: NEGATIVE
pH, UA: 5

## 2015-10-07 NOTE — Progress Notes (Signed)
Pt is here for urinalysis prior to Urodynamics testing.  Pt denies any UTI symptoms.  Urine dipstick negative.  Pt will return for Urodynamics on 10/12/15.  Pt will call prior to that date if she has any UTI symptoms.

## 2015-10-09 NOTE — Progress Notes (Signed)
Encounter reviewed by Dr. Brook Amundson C. Silva.  

## 2015-10-12 ENCOUNTER — Ambulatory Visit: Payer: Medicare Other

## 2015-10-18 ENCOUNTER — Ambulatory Visit: Payer: Medicare Other | Admitting: Obstetrics and Gynecology

## 2015-10-25 ENCOUNTER — Telehealth: Payer: Self-pay | Admitting: Internal Medicine

## 2015-10-25 DIAGNOSIS — R1084 Generalized abdominal pain: Secondary | ICD-10-CM

## 2015-10-25 NOTE — Telephone Encounter (Signed)
Ok Thx 

## 2015-10-25 NOTE — Telephone Encounter (Signed)
Please advise 

## 2015-10-25 NOTE — Telephone Encounter (Signed)
Pt is wanting a referral to a Gastro doctor Dr. Laural Golden in [place].  She stated on her last visit Dr. Camila Li said her pain was because of her bowels. She was referred to an OB and she is concerned about procedures that they are wanting to do before seeing the gastro doctor Please advise

## 2015-10-26 ENCOUNTER — Encounter (INDEPENDENT_AMBULATORY_CARE_PROVIDER_SITE_OTHER): Payer: Self-pay | Admitting: *Deleted

## 2015-10-28 ENCOUNTER — Telehealth: Payer: Self-pay | Admitting: Internal Medicine

## 2015-10-28 NOTE — Telephone Encounter (Signed)
Pt contacted and given MD response. Pt is very upset. States that PCP gives abx without an appt all of the time. Pt was extremely mad and yelled that she could not wait to get into this office and tell PCP about the way she was treated. Pt stated that there is an appt on the 11th of January and she was going to let everyone know about how she was treated. Pt wanted to know about the GI referral and why has she not heard anything about it. Pt stated and asserted that called the office on Monday and spoke to someone. (office was closed on the 26th of December). Informed pt that she has been scheduled with GI in [place] for 1/25 at 11:30am. Pt was upset that she was not contacted.  Advised pt to go or call for transportation to the nearest urgent care or ER for evaluation. Pt was even more angry on the phone and yelled that she could not wait to get into the office on the 11th, I offered to be of additional assistance. Pt hung up on me.

## 2015-10-28 NOTE — Telephone Encounter (Signed)
pls advise

## 2015-10-28 NOTE — Telephone Encounter (Signed)
I am not sure what kind of antibiotic she would want and since she has not been seen since October would not feel comfortable prescribing. Recommend acute visit with our office or urgent care.

## 2015-10-28 NOTE — Telephone Encounter (Signed)
°  Patient Name: Melody [name]  DOB: 02-20-1956    Initial Comment Caller states head and nose is congested, chest is hurting    Nurse Assessment  Nurse: Raphael Gibney, RN, Vanita Ingles Date/Time (Eastern Time): 10/28/2015 8:53:53 AM  Confirm and document reason for call. If symptomatic, describe symptoms. ---Caller states she has nasal congestion and chest congestion. She is coughing. She is having trouble breathing. Has had symptoms since Monday. She is having chest pain.  Has the patient traveled out of the country within the last 30 days? ---No  Does the patient have any new or worsening symptoms? ---Yes  Will a triage be completed? ---Yes  Related visit to physician within the last 2 weeks? ---No  Does the PT have any chronic conditions? (i.e. diabetes, asthma, etc.) ---Yes  List chronic conditions. ---thyroid  Is this a behavioral health or substance abuse call? ---No     Guidelines    Guideline Title Affirmed Question Affirmed Notes  Cough - Acute Non-Productive Difficulty breathing    Final Disposition User   Go to ED Now Raphael Gibney, RN, Vanita Ingles    Comments  Pt does not want to go to the ER. She is far away from the office and does not to make appt. She is wanting a Z pak or amoxicillin called in. Please call pt back and let her know if medication will be called in.  Called back line and spoke to Cori Razor and gave report that pt has cough and congestion since Monday She is having chest pain and sounded SOB on the phone with ER outcome. She does not want to go to the ER but want a Z pak or amoxicillin called in. Please call pt and let her know if antibiotic will be called in.   Referrals  GO TO FACILITY REFUSED   Disagree/Comply: Disagree  Disagree/Comply Reason: Disagree with instructions

## 2015-10-28 NOTE — Telephone Encounter (Signed)
Pt contact via MD response to abx request.

## 2015-10-28 NOTE — Telephone Encounter (Signed)
Pt called back stated she request an antibiotic for this reason (see massage below) and demand to speak to the Dr. Parks Neptune assistant. The assistant was with the patient at the moment, advise pt that Dr. Camila Li is out of the office until Tuesday, offer to look at other location for availability (pt did not want to hear anything we offer), transfer pt to speak to the caller nurse. Patient is not really happy.

## 2015-11-09 ENCOUNTER — Ambulatory Visit (INDEPENDENT_AMBULATORY_CARE_PROVIDER_SITE_OTHER): Payer: Medicare Other | Admitting: Obstetrics and Gynecology

## 2015-11-09 ENCOUNTER — Encounter: Payer: Self-pay | Admitting: Internal Medicine

## 2015-11-09 ENCOUNTER — Ambulatory Visit (INDEPENDENT_AMBULATORY_CARE_PROVIDER_SITE_OTHER): Payer: Medicare Other | Admitting: Internal Medicine

## 2015-11-09 ENCOUNTER — Encounter: Payer: Self-pay | Admitting: Obstetrics and Gynecology

## 2015-11-09 VITALS — BP 130/78 | HR 91 | Temp 98.1°F | Wt 149.0 lb

## 2015-11-09 VITALS — BP 102/70 | HR 90 | Resp 16 | Ht 64.0 in | Wt 150.4 lb

## 2015-11-09 DIAGNOSIS — E538 Deficiency of other specified B group vitamins: Secondary | ICD-10-CM

## 2015-11-09 DIAGNOSIS — F411 Generalized anxiety disorder: Secondary | ICD-10-CM

## 2015-11-09 DIAGNOSIS — N3946 Mixed incontinence: Secondary | ICD-10-CM

## 2015-11-09 DIAGNOSIS — N393 Stress incontinence (female) (male): Secondary | ICD-10-CM | POA: Diagnosis not present

## 2015-11-09 DIAGNOSIS — N812 Incomplete uterovaginal prolapse: Secondary | ICD-10-CM | POA: Diagnosis not present

## 2015-11-09 DIAGNOSIS — M5440 Lumbago with sciatica, unspecified side: Secondary | ICD-10-CM | POA: Diagnosis not present

## 2015-11-09 DIAGNOSIS — R32 Unspecified urinary incontinence: Secondary | ICD-10-CM | POA: Insufficient documentation

## 2015-11-09 DIAGNOSIS — N816 Rectocele: Secondary | ICD-10-CM | POA: Diagnosis not present

## 2015-11-09 DIAGNOSIS — G8929 Other chronic pain: Secondary | ICD-10-CM

## 2015-11-09 MED ORDER — OXYCODONE-ACETAMINOPHEN 5-325 MG PO TABS: 0.5000 | ORAL_TABLET | Freq: Two times a day (BID) | ORAL | Status: DC | PRN

## 2015-11-09 MED ORDER — LEVOFLOXACIN 500 MG PO TABS: 500.0000 mg | ORAL_TABLET | Freq: Every day | ORAL | Status: DC

## 2015-11-09 NOTE — Progress Notes (Signed)
Subjective:  Patient ID: Melody [name], female    DOB: 1956/07/18  Age: 60 y.o. MRN: [id number]  CC: No chief complaint on file.   HPI Melody [name] presents for URI sx's since Christmas - coughing. F/u LBP, hypothyroidism, GERD f/u  Outpatient Prescriptions Prior to Visit  Medication Sig Dispense Refill  . Albuterol Sulfate (PROAIR RESPICLICK) 123XX123 (90 BASE) MCG/ACT AEPB Inhale 1-2 puffs into the lungs 4 (four) times daily as needed. 1 each 11  . ALPRAZolam (XANAX) 1 MG tablet Take 1 mg by mouth 4 (four) times daily.     Marland Kitchen amphetamine-dextroamphetamine (ADDERALL) 30 MG tablet Take 15-30 mg by mouth every morning. Takes one tablet in the morning, may take one-half tablet in the afternoon if needed    . cholecalciferol (VITAMIN D) 1000 UNITS tablet Take 2 tablets (2,000 Units total) by mouth daily. 100 tablet 3  . cyanocobalamin (,VITAMIN B-12,) 1000 MCG/ML injection INJECT 1 ML INTO THE MUSCLE EVERY 14 DAYS. 2 mL 6  . eletriptan (RELPAX) 20 MG tablet One tablet by mouth as needed for migraine headache.  If the headache improves and then returns, dose may be repeated after 2 hours have elapsed since first dose (do not exceed 80 mg per day). may repeat in 2 hours if necessary     . fluticasone (FLONASE) 50 MCG/ACT nasal spray USE 2 SPRAYS IN BOTH NOSTRILS DAILY. (Patient taking differently: Place 2 sprays into both nostrils daily. USE 2 SPRAYS IN BOTH NOSTRILS DAILY.) 16 g 11  . levocetirizine (XYZAL) 5 MG tablet Take 1 tablet (5 mg total) by mouth every evening. 90 tablet 3  . levothyroxine (SYNTHROID, LEVOTHROID) 50 MCG tablet Take 1 tablet (50 mcg total) by mouth daily before breakfast. 30 tablet 11  . pantoprazole (PROTONIX) 40 MG tablet Take 1 tablet (40 mg total) by mouth daily. 30 tablet 11  . QUEtiapine (SEROQUEL) 25 MG tablet Take 25 mg by mouth at bedtime.     . triamcinolone cream (KENALOG) 0.5 % Apply 1 application topically 3 (three) times daily. 60 g 3  . oxyCODONE-acetaminophen  (ROXICET) 5-325 MG tablet Take 0.5-1 tablets by mouth 2 (two) times daily as needed for severe pain. Please fill on or after 09/29/2015 60 tablet 0  . PROAIR HFA 108 (90 BASE) MCG/ACT inhaler      No facility-administered medications prior to visit.    ROS Review of Systems  Constitutional: Positive for fever and fatigue. Negative for chills, activity change, appetite change and unexpected weight change.  HENT: Positive for congestion and rhinorrhea. Negative for mouth sores and sinus pressure.   Eyes: Negative for visual disturbance.  Respiratory: Positive for cough and chest tightness.   Gastrointestinal: Negative for nausea and abdominal pain.  Genitourinary: Positive for urgency and frequency. Negative for difficulty urinating and vaginal pain.  Musculoskeletal: Negative for back pain and gait problem.  Skin: Negative for pallor and rash.  Neurological: Negative for dizziness, tremors, weakness, numbness and headaches.  Psychiatric/Behavioral: Negative for suicidal ideas, confusion, sleep disturbance and decreased concentration. The patient is nervous/anxious.     Objective:  BP 130/78 mmHg  Pulse 91  Temp(Src) 98.1 F (36.7 C) (Oral)  Wt 149 lb (67.586 kg)  SpO2 98%  LMP 10/29/2006 (Approximate)  BP Readings from Last 3 Encounters:  11/09/15 130/78  11/09/15 102/70  11/09/15 102/70    Wt Readings from Last 3 Encounters:  11/09/15 149 lb (67.586 kg)  11/09/15 150 lb 6.4 oz (68.221 kg)  11/09/15  150 lb 6.4 oz (68.221 kg)    Physical Exam  Constitutional: She appears well-developed. No distress.  HENT:  Head: Normocephalic.  Right Ear: External ear normal.  Left Ear: External ear normal.  Nose: Nose normal.  Mouth/Throat: Oropharynx is clear and moist.  Eyes: Conjunctivae are normal. Pupils are equal, round, and reactive to Porro. Right eye exhibits no discharge. Left eye exhibits no discharge.  Neck: Normal range of motion. Neck supple. No JVD present. No tracheal  deviation present. No thyromegaly present.  Cardiovascular: Normal rate, regular rhythm and normal heart sounds.   Pulmonary/Chest: No stridor. No respiratory distress. She has no wheezes.  Abdominal: Soft. Bowel sounds are normal. She exhibits no distension and no mass. There is no tenderness. There is no rebound and no guarding.  Musculoskeletal: She exhibits no edema or tenderness.  Lymphadenopathy:    She has no cervical adenopathy.  Neurological: She displays normal reflexes. No cranial nerve deficit. She exhibits normal muscle tone. Coordination normal.  Skin: No rash noted. No erythema.  Psychiatric: She has a normal mood and affect. Her behavior is normal. Judgment and thought content normal.  eryth throat Mild B ronchi  Lab Results  Component Value Date   WBC 5.5 09/03/2014   HGB 14.1 09/03/2014   HCT 43.3 09/03/2014   PLT 166.0 09/03/2014   GLUCOSE 79 09/03/2014   CHOL 186 09/03/2014   TRIG 64.0 09/03/2014   HDL 55.00 09/03/2014   LDLCALC 118* 09/03/2014   ALT 11 09/03/2014   AST 16 09/03/2014   NA 139 09/03/2014   K 3.9 09/03/2014   CL 104 09/03/2014   CREATININE 0.8 09/03/2014   BUN 12 09/03/2014   CO2 30 09/03/2014   TSH 3.69 09/03/2014    No results found.  Assessment & Plan:   Diagnoses and all orders for this visit:  Mixed incontinence  Chronic midline low back pain with sciatica, sciatica laterality unspecified  Anxiety state  B12 deficiency  Other orders -     Discontinue: oxyCODONE-acetaminophen (ROXICET) 5-325 MG tablet; Take 0.5-1 tablets by mouth 2 (two) times daily as needed for severe pain. Please fill on or after 11/09/2015 -     Discontinue: oxyCODONE-acetaminophen (ROXICET) 5-325 MG tablet; Take 0.5-1 tablets by mouth 2 (two) times daily as needed for severe pain. Please fill on or after 12/10/2015 -     oxyCODONE-acetaminophen (ROXICET) 5-325 MG tablet; Take 0.5-1 tablets by mouth 2 (two) times daily as needed for severe pain. Please fill  on or after 01/07/2016 -     Discontinue: levofloxacin (LEVAQUIN) 500 MG tablet; Take 1 tablet (500 mg total) by mouth daily. -     levofloxacin (LEVAQUIN) 500 MG tablet; Take 1 tablet (500 mg total) by mouth daily.  I have discontinued Ms. Slabach's oxyCODONE-acetaminophen and oxyCODONE-acetaminophen. I have also changed her oxyCODONE-acetaminophen. Additionally, I am having her maintain her eletriptan, QUEtiapine, ALPRAZolam, amphetamine-dextroamphetamine, triamcinolone cream, Albuterol Sulfate, cholecalciferol, levothyroxine, pantoprazole, fluticasone, levocetirizine, cyanocobalamin, and levofloxacin.  Meds ordered this encounter  Medications  . DISCONTD: oxyCODONE-acetaminophen (ROXICET) 5-325 MG tablet    Sig: Take 0.5-1 tablets by mouth 2 (two) times daily as needed for severe pain. Please fill on or after 11/09/2015    Dispense:  60 tablet    Refill:  0  . DISCONTD: oxyCODONE-acetaminophen (ROXICET) 5-325 MG tablet    Sig: Take 0.5-1 tablets by mouth 2 (two) times daily as needed for severe pain. Please fill on or after 12/10/2015    Dispense:  60  tablet    Refill:  0  . oxyCODONE-acetaminophen (ROXICET) 5-325 MG tablet    Sig: Take 0.5-1 tablets by mouth 2 (two) times daily as needed for severe pain. Please fill on or after 01/07/2016    Dispense:  60 tablet    Refill:  0  . DISCONTD: levofloxacin (LEVAQUIN) 500 MG tablet    Sig: Take 1 tablet (500 mg total) by mouth daily.    Dispense:  10 tablet    Refill:  0  . levofloxacin (LEVAQUIN) 500 MG tablet    Sig: Take 1 tablet (500 mg total) by mouth daily.    Dispense:  10 tablet    Refill:  0     Follow-up: Return in about 3 months (around 02/07/2016) for a follow-up visit.  Walker Kehr, MD

## 2015-11-09 NOTE — Assessment & Plan Note (Signed)
Chronic  Potential benefits of a long term benzodiazepines  use as well as potential risks  and complications were explained to the patient and were aknowledged. Dr [name] Seroquel, Xanax prn

## 2015-11-09 NOTE — Assessment & Plan Note (Signed)
On B12 

## 2015-11-09 NOTE — Patient Instructions (Signed)
Use over-the-counter  "cold" medicines  such as "Tylenol cold" , "Advil cold",  "Mucinex" or" Mucinex D"  for cough and congestion.   Avoid decongestants if you have high blood pressure and use "Afrin" nasal spray for nasal congestion as directed instead. Use" Delsym" or" Robitussin" cough syrup varietis for cough.  You can use plain "Tylenol" or "Advil" for fever, chills and achyness. Use Halls or Ricola cough drops.  Please, make an appointment if you are not better or if you're worse.  

## 2015-11-09 NOTE — Assessment & Plan Note (Signed)
1/17 Prolapse - Dr Quincy Simmonds advised surgery

## 2015-11-09 NOTE — Progress Notes (Signed)
Melody [name] is a 60 y.o. female Who presents today for urodynamics testing, ordered by Dr. Quincy Simmonds.   Allergies and medications reviewed.  Denies complaints today, has cough and URI complaints but is seeing PCP this afternoon for appointment. . No urinary complaints.   Urine Micro exam: negative for WBC's or RBC's, negative one month ago, okay to proceed per Dr. Quincy Simmonds if patient denies urinary complaints at this time.  Patient reports urinary leakage with coughing, sneezing, heavy lifting. Patient also expresses concerns regarding fecal incontinence.  #2 Milex Ring placed for reduction of prolapse prior to initiating urodynamics.  Urodynamics testing initiated. Lumax Bladder Catheter #10 [place] and lumax Abdominal Catheter #10 [place].   Post void residual 40 ml.   Urethral catheter placed without issue. Rectal catheter placed without issue.   Urodynamics testing completed. Please see scanned Patient summary report in Epic. Procedure completed and patient tolerated well without complaints. Patient  To have office visit with Dr. Quincy Simmonds today to discuss results.    Patient given post procedure instructions:  You may have a mild bladder and rectal discomfort for a few hours after the test. You may experience some frequent urination and slight burning the first few times you urinate after the test. Rarely, the urine may be blood tinged. These are both due to catheter placements and resolve quickly. You should call our office immediately if you have signs of infection, which may include bladder pain, urinary urgency, fever, or burning during urination. We do encourage you to drink plenty of water after the test.    Charges: [place]

## 2015-11-09 NOTE — Assessment & Plan Note (Signed)
Chronic, severe MSK 2/16 better on Percocet   Potential benefits of a long term opioids use as well as potential risks (i.e. addiction risk, apnea etc) and complications (i.e. Somnolence, constipation and others) were explained to the patient and were aknowledged.  Potential benefits of a long term NSAID  use as well as potential risks  and complications were explained to the patient and were aknowledged.   

## 2015-11-09 NOTE — Patient Instructions (Signed)
.   You may have a mild bladder and rectal discomfort for a few hours after the test. . You may experience some frequent urination and slight burning the first few times you urinate after the test. Rarely, the urine may be blood tinged. These are both due to catheter placements and resolve quickly.  . You should call our office immediately if you have signs of infection, which may include bladder pain, urinary urgency, fever, or burning during urination. .  We do encourage you to drink plenty of water after completion of the test today.   

## 2015-11-09 NOTE — Progress Notes (Signed)
GYNECOLOGY  VISIT   HPI: 60 y.o.   Married  Caucasian  female   G2P2002 with Patient's last menstrual period was 10/29/2006 (approximate).   here for  Urodynamics consult    Has uterovaginal prolapse, leakage of urine, and fecal incontinence.  Patient is thinking about potential surgery.  Has been watching U Tube videos about it.   Metamucil made the fecal incontinence worse. Was having a lot of bowel movements. Stopped Metamucil. Bowel function is now much more normal.  DF - voiding frequency difficult for patient to express.  NF - none.  Can leak urine without warning.  Cold can bring on urge to void.  No urge to empty bladder with water running.  Can leak if coughs really deep.  No leak with sneeze or laugh.  Can leak urine if doing heavy yard work like chopping wood.   On prior physical exam is noted to have a third degree cystocele. Almost second degree uterine prolapse, first degree rectocele.  Testing today - multichannel testing using pessary for reduction of prolapse.  Multichannel urodynamic testing: Uroflow - contin54 cm H2)uous.  Void 90 cc.  PVR 40 cc.  CMG - Multiple detrusor contractions.  S1 194 cc.  S2 261 cc.  Cystometric capacity 258.6 cc.  LPP 54 cm H20.  Had a detrusor contraction with the valsalva at max capacity and lost bladder control.  Pressure flow and UPP not performed.  Mixed incontinence.  GYNECOLOGIC HISTORY: Patient's last menstrual period was 10/29/2006 (approximate). Contraception: Post Menopausal  Menopausal hormone therapy: None Last mammogram: 10/29/13 PCP Last pap smear: 08/10/15 Neg        OB History    Gravida Para Term Preterm AB TAB SAB Ectopic Multiple Living   2 2 2       2          Patient Active Problem List   Diagnosis Date Noted  . Vaginal discomfort 08/10/2015  . Rectocele 08/10/2015  . Well adult exam 09/03/2014  . Chronic obstructive airway disease with asthma ([place]) 09/03/2014  . UTI (urinary tract infection)  06/09/2014  . Dysplastic toenail 09/02/2013  . Aphthous ulcer 06/11/2013  . Dark urine 03/18/2013  . Rash 06/08/2012  . PRURITUS 12/01/2010  . EXPOSURE TO HAZARDOUS BODY FLUID, HX OF 05/29/2010  . TOBACCO USER 02/24/2010  . BRONCHITIS, ACUTE 12/05/2009  . HIP PAIN 12/05/2009  . Acute sinusitis 09/02/2009  . RUQ PAIN 02/04/2009  . WRIST PAIN 05/24/2008  . CERVICAL STRAIN 05/24/2008  . WARTS, VIRAL, UNSPECIFIED 04/06/2008  . NEOPLASM, SKIN, UNCERTAIN BEHAVIOR 0000000  . Vitamin D deficiency 11/13/2007  . UPPER RESPIRATORY INFECTION (URI) 11/11/2007  . Hypothyroidism 08/05/2007  . BIPOLAR DEPRESSION 08/05/2007  . GERD 08/05/2007  . Anxiety state 07/23/2007  . LOW BACK PAIN 07/23/2007  . Myalgia and myositis 07/23/2007  . B12 deficiency 05/22/2007  . IRON DEFICIENCY 05/22/2007    Past Medical History  Diagnosis Date  . Depression     bipolar- Dr [name]  . Anxiety   . Low back pain     Dr Arnoldo Morale  . GERD (gastroesophageal reflux disease)   . Vitamin B12 deficiency   . Sinus congestion   . Vertigo   . Fibromyalgia   . Headache   . Chronic back pain   . Fatigue   . Chronic neck pain   . Thyroid disease     hypothyroidsm  . Osteoarthritis     Past Surgical History  Procedure Laterality Date  . Cervical laminectomy  2001 and 2995    Dr Arnoldo Morale  . Tubal ligation  1980    Current Outpatient Prescriptions  Medication Sig Dispense Refill  . Albuterol Sulfate (PROAIR RESPICLICK) [id number] (90 BASE) MCG/ACT AEPB Inhale 1-2 puffs into the lungs 4 (four) times daily as needed. 1 each 11  . ALPRAZolam (XANAX) 1 MG tablet Take 1 mg by mouth 4 (four) times daily.     Marland Kitchen amphetamine-dextroamphetamine (ADDERALL) 30 MG tablet Take 15-30 mg by mouth every morning. Takes one tablet in the morning, may take one-half tablet in the afternoon if needed    . cholecalciferol (VITAMIN D) 1000 UNITS tablet Take 2 tablets (2,000 Units total) by mouth daily. 100 tablet 3  . cyanocobalamin  (,VITAMIN B-12,) 1000 MCG/ML injection INJECT 1 ML INTO THE MUSCLE EVERY 14 DAYS. 2 mL 6  . eletriptan (RELPAX) 20 MG tablet One tablet by mouth as needed for migraine headache.  If the headache improves and then returns, dose may be repeated after 2 hours have elapsed since first dose (do not exceed 80 mg per day). may repeat in 2 hours if necessary     . fluticasone (FLONASE) 50 MCG/ACT nasal spray USE 2 SPRAYS IN BOTH NOSTRILS DAILY. (Patient taking differently: Place 2 sprays into both nostrils daily. USE 2 SPRAYS IN BOTH NOSTRILS DAILY.) 16 g 11  . levocetirizine (XYZAL) 5 MG tablet Take 1 tablet (5 mg total) by mouth every evening. 90 tablet 3  . levothyroxine (SYNTHROID, LEVOTHROID) 50 MCG tablet Take 1 tablet (50 mcg total) by mouth daily before breakfast. 30 tablet 11  . oxyCODONE-acetaminophen (ROXICET) 5-325 MG tablet Take 0.5-1 tablets by mouth 2 (two) times daily as needed for severe pain. Please fill on or after 09/29/2015 60 tablet 0  . QUEtiapine (SEROQUEL) 25 MG tablet Take 25 mg by mouth at bedtime.     . triamcinolone cream (KENALOG) 0.5 % Apply 1 application topically 3 (three) times daily. 60 g 3  . pantoprazole (PROTONIX) 40 MG tablet Take 1 tablet (40 mg total) by mouth daily. (Patient not taking: Reported on 11/09/2015) 30 tablet 11   No current facility-administered medications for this visit.     ALLERGIES: Doxycycline hyclate; Nabumetone; Sulfa antibiotics; Tetracycline hcl; and Tramadol hcl  Family History  Problem Relation Age of Onset  . Arthritis Other   . Heart attack Father     dec heart disease age 29  . Hypertension Father   . Cancer Mother     dec stomach ca--age 4  . Breast cancer Paternal Aunt   . Breast cancer Cousin     Social History   Social History  . Marital Status: Married    Spouse Name: N/A  . Number of Children: N/A  . Years of Education: N/A   Occupational History  . Not on file.   Social History Main Topics  . Smoking status:  Former Smoker -- 0.50 packs/day    Types: Cigarettes    Quit date: 05/06/2011  . Smokeless tobacco: Never Used  . Alcohol Use: No  . Drug Use: No  . Sexual Activity: Yes    Birth Control/ Protection: Post-menopausal, Surgical     Comment: Tubal   Other Topics Concern  . Not on file   Social History Narrative    ROS:  Pertinent items are noted in HPI.  PHYSICAL EXAMINATION:    BP 102/70 mmHg  Pulse 90  Resp 16  Ht 5\' 4"  (1.626 m)  Wt 150 lb 6.4 oz (  68.221 kg)  BMI 25.80 kg/m2  LMP 10/29/2006 (Approximate)    General appearance: alert, cooperative and appears stated age   ASSESSMENT  Incomplete uterovaginal prolapse.  Mixed incontinence.  Asthma.  Chronic neck and back pain.  Hx of cervical laminectomy.   PLAN  I have had a comprehensive discussion with the patient regarding prolapse and urinary incontinence.  I have provided reading materials from ACOG regarding prolapse and incontinence in general as well as medical and surgical treatment for these conditions. Medical treatments may include physical therapy, pessary use, anticholinergic/antimuscarinic therapy.   I am recommending a total abdominal hysterectomy with bilateral salpingo-oophorectomy, abdominal sacrocolpopexy with permanent graft, a Halban's culdoplasty, an anterior and posterior colporrhaphy, and TVT Exact midurethral sling and cystoscopy if surgical care is selected.   We discussed benefits and risks of surgery which include but are not limited to bleeding, infection, damage to surrounding organs, ureteral damage, vaginal pain with intercourse, permanent mesh use which may cause erosion and exposure in the vagina, urethra, bladder or ureters, dyspareunia, slower voiding and urinary retention, possible need for prolonged catheterization and/or self catheterization, de novo overactive bladder symptoms, reoperation, recurrence of prolapse and incontinence,  DVT, PE, death, and reaction to anesthesia.    I have  discussed surgical expectations regarding the procedures and success rates, outcomes, and recovery.    Patient wishes to proceed with precert of surgical procedure.   Will need anesthesia consult.   Due to mammogram also.   An After Visit Summary was printed and given to the patient.  __40____ minutes face to face time of which over 50% was spent in counseling.

## 2015-11-09 NOTE — Progress Notes (Signed)
Pre visit review using our clinic review tool, if applicable. No additional management support is needed unless otherwise documented below in the visit note. 

## 2015-11-10 ENCOUNTER — Encounter: Payer: Self-pay | Admitting: Obstetrics and Gynecology

## 2015-11-10 NOTE — Progress Notes (Signed)
Encounter reviewed by Dr. Aundria Rud.  Multichannel urodynamic testing: Uroflow - contin54 cm H2)uous.  Void 90 cc.  PVR 40 cc.  CMG - Multiple detrusor contractions.  S1 194 cc.  S2 261 cc.  Cystometric capacity 258.6 cc.  LPP 54 cm H20.  Had a detrusor contraction with the valsalva at max capacity and lost bladder control.  Pressure flow and UPP not performed.   Mixed incontinence.

## 2015-11-23 ENCOUNTER — Ambulatory Visit (INDEPENDENT_AMBULATORY_CARE_PROVIDER_SITE_OTHER): Payer: Medicare Other | Admitting: Internal Medicine

## 2015-12-14 ENCOUNTER — Telehealth: Payer: Self-pay | Admitting: Obstetrics and Gynecology

## 2015-12-14 NOTE — Telephone Encounter (Signed)
Routing to [employer] for review of surgery pre cert information.

## 2015-12-14 NOTE — Telephone Encounter (Signed)
Patient calling to check on surgery, patient says Dr.Silva recommended it and that we would check with her insurance. Patient says it's been a month and she hasn't heard anything. Best # to reach: [phone] CC: Lerry Liner

## 2015-12-19 NOTE — Telephone Encounter (Signed)
Spoke with pt regarding benefit for surgery. Patient understood and agreeable. Patient ready to schedule. Per benefits verification by myself and Seth Bake, patient responsibility will be $0.00. Patient aware this is professional benefit only. Patient aware will be contacted by hospital for separate benefits. Forward to [place] to schedule.

## 2015-12-20 ENCOUNTER — Telehealth: Payer: Self-pay | Admitting: *Deleted

## 2015-12-20 NOTE — Telephone Encounter (Signed)
Patient returned call. Surgery date options discussed. Patient considering options since has many social concerns with family and heats her home with wood which is a concern with lifting restrictions. Advise patient to speak with family members and call back with decision. Call time 21 minutes.  Routing to provider for final review. Patient agreeable to disposition. Will close encounter.

## 2015-12-20 NOTE — Telephone Encounter (Signed)
Call to patient to discuss surgery date options. Left message to call back. 

## 2015-12-21 ENCOUNTER — Telehealth: Payer: Self-pay | Admitting: Obstetrics and Gynecology

## 2015-12-21 NOTE — Telephone Encounter (Signed)
Patient calling with available surgery date wanting to schedule for 01/10/16. Also has other questions for you [name]. Patient said can you please call her.

## 2015-12-21 NOTE — Telephone Encounter (Signed)
Return call to patient. Patient desires to proceed with 01-10-16 surgery date.  Surgery consult scheduled for 12-23-15 at 2pm with Dr Quincy Simmonds. Will review all surgery instructions at that time.  Routing to provider for final review. Patient agreeable to disposition. Will close encounter.

## 2015-12-21 NOTE — Telephone Encounter (Signed)
Patient calling [name] again.

## 2015-12-22 ENCOUNTER — Telehealth: Payer: Self-pay | Admitting: *Deleted

## 2015-12-22 NOTE — Telephone Encounter (Signed)
Return call to patient. Advised of issue with surgery date and need to move date. Discussed next available options of 02-07-16 and 02-21-16. Patient very upset regarding delay in date. States she has to discuss with her sister who is a Teacher, adult education of credit [employer]" and has many scheduling issues. Apologized for inconvenience. Advised to call back with decision regarding date.

## 2015-12-22 NOTE — Telephone Encounter (Signed)
Patient returned call

## 2015-12-22 NOTE — Telephone Encounter (Signed)
Call to patient, left message to call back. After case submitted to surgery scheduling, hospital unable to schedule date of 01-10-16 as requested.

## 2015-12-23 ENCOUNTER — Encounter: Payer: Self-pay | Admitting: Emergency Medicine

## 2015-12-23 ENCOUNTER — Telehealth: Payer: Self-pay | Admitting: Emergency Medicine

## 2015-12-23 ENCOUNTER — Ambulatory Visit: Payer: Medicare Other | Admitting: Obstetrics and Gynecology

## 2015-12-23 DIAGNOSIS — Z1239 Encounter for other screening for malignant neoplasm of breast: Secondary | ICD-10-CM

## 2015-12-23 NOTE — Telephone Encounter (Signed)
Call to patient, left message to call back for [name] or [name]

## 2015-12-23 NOTE — Telephone Encounter (Signed)
Thank you for the update - I agree with your recommendations.

## 2015-12-23 NOTE — Telephone Encounter (Signed)
Calling patient to advise that her screening mammogram is due and that it will need to be completed prior to surgery. Patient agreeable. She cannot remember when her last mammogram was or location. She states she thinks she had a mammogram at [facility], however, no record in epic and called and they do not have record.  Patient thinks she had a mammogram at [facility] as well, called the hospital and her last mammogram on file is 02/05/2006. No record at [facility] or [facility]  Scheduled patient for screening mammogram at [place] ([place])  [phone] extension [phone]:  [facility] at [address] For Thursday 12/29/15 at 1:00. Patient agreeable to time/date/location.   Will give patient pre-op instructions in person at her pre-op appointment written and verbally.   Routing to provider for final review. Patient agreeable to disposition. Will close encounter.

## 2015-12-23 NOTE — Telephone Encounter (Signed)
Call to patient. Advised that accommodations were made to OR schedule and that 01/10/16 can accommodate her surgery now.  Patient agreeable to date of 01/10/16 and states that she has made plans for this date and that she would like to have her surgery on this date. She states "I just need to know why, this is crazy, first my paperwork was lost and now this change, it's like a slap in the face." Offered apologies but advised that we are trying to address her concerns by change to OR schedule.   Patient is advised she will need to have pre-op appointment with both Dr. Quincy Simmonds and with the pre-admissions nurse at [facility] hospital.  Patient advised she will be contacted by [facility] hospital nurse, pharmacy and insurance department.   Pre op appointment with Dr. Quincy Simmonds scheduled for 12/28/15 at 1430.  1 week post op appointment with Dr. Quincy Simmonds 01/16/16 at 1530. Patient very hesitant to schedule this one week post op. She states she is concerned that she will have only been out of the hospital for four days when we want her to come back. Advised that it is important she keep one week post op appointment as scheduled.  1 month post op appointment with Dr. Quincy Simmonds 02/22/16 at 1430.   Called to [employer] and OR date is changed to 01/10/16 at 0730. Patient knows to arrive at 0600.  Advised she will be contacted with any further pre-op instructions and that these will be given to her at her appointment or mailed to home after [name] completes paperwork. Patient agreeable.

## 2015-12-26 ENCOUNTER — Other Ambulatory Visit: Payer: Self-pay | Admitting: Internal Medicine

## 2015-12-28 ENCOUNTER — Telehealth: Payer: Self-pay | Admitting: *Deleted

## 2015-12-28 ENCOUNTER — Encounter: Payer: Self-pay | Admitting: Obstetrics and Gynecology

## 2015-12-28 ENCOUNTER — Telehealth: Payer: Self-pay | Admitting: Emergency Medicine

## 2015-12-28 ENCOUNTER — Ambulatory Visit (INDEPENDENT_AMBULATORY_CARE_PROVIDER_SITE_OTHER): Payer: Medicare Other | Admitting: Obstetrics and Gynecology

## 2015-12-28 ENCOUNTER — Encounter: Payer: Self-pay | Admitting: Emergency Medicine

## 2015-12-28 VITALS — BP 102/70 | HR 84 | Resp 18 | Ht 64.0 in | Wt 141.2 lb

## 2015-12-28 DIAGNOSIS — N3946 Mixed incontinence: Secondary | ICD-10-CM

## 2015-12-28 DIAGNOSIS — Z1231 Encounter for screening mammogram for malignant neoplasm of breast: Secondary | ICD-10-CM

## 2015-12-28 DIAGNOSIS — N813 Complete uterovaginal prolapse: Secondary | ICD-10-CM | POA: Diagnosis not present

## 2015-12-28 MED ORDER — AZITHROMYCIN 250 MG PO TABS: ORAL_TABLET | ORAL | Status: DC

## 2015-12-28 NOTE — Telephone Encounter (Signed)
Patient called and states she does not want to have her mammogram completed at [facility] due to problems that the hospital has been having that have been in the news. She requests that mammogram be changed to [facility].   Patient states she feels like she may have a sinus infection and has nasal congestion, wants to keep appointment today with Dr. Quincy Simmonds.   Advised patient will make changes per her request for mammogram and will return call.   [place] and left message on scheduling line to cancel screening mammogram for tomorrow.  Patient now has screening mammogram appointment at [address] on 01/02/16 at 4:45.   Patient agreeable and verbalized understanding.  Routing to provider for final review. Patient agreeable to disposition. Will close encounter.

## 2015-12-28 NOTE — Progress Notes (Signed)
Patient given pre-op instructions at office visit verbally and in writing (see letter dated 12/28/15). She is given printed appointment information and verbalizes understanding of all instructions. She is given pre-op instructions for Fleets enema prior to surgery and instructions on use.   Mammogram has been scheduled at [facility].

## 2015-12-28 NOTE — Telephone Encounter (Signed)
[employer] laboratory technician

## 2015-12-28 NOTE — Telephone Encounter (Signed)
Pt is scheduled for surgery with Dr. Quincy Simmonds on 01/10/16. She c/o URI, sinusitis x last week. She feels it is moving into her chest and Dr. Quincy Simmonds wants her to have antibiotics and be in tip top shape for her procedure. Please advise

## 2015-12-28 NOTE — Progress Notes (Signed)
Patient ID: Melody [name], female   DOB: [birth year], 60 y.o.   MRN: [id number] GYNECOLOGY  VISIT   HPI: 60 y.o.   Married  Caucasian  female   G2P2002 with Patient's last menstrual period was 10/29/2006 (approximate).   here for surgical consult.   Has uterovaginal prolapse, leakage of urine, and fecal incontinence.  Patient is thinking about potential surgery.  Has been watching U Tube videos about it.   Metamucil made the fecal incontinence worse. Was having a lot of bowel movements. Stopped Metamucil. Bowel function is now much more normal.  DF - voiding frequency difficult for patient to express.  NF - none.  Can leak urine without warning.  Cold can bring on urge to void.  No urge to empty bladder with water running.  Can leak if coughs really deep.  No leak with sneeze or laugh.  Can leak urine if doing heavy yard work like chopping wood.   On prior physical exam is noted to have a third degree cystocele. Almost second degree uterine prolapse, first degree rectocele.  Multichannel testing using pessary for reduction of prolapse:  Uroflow - continuous. Void 90 cc. PVR 40 cc.  CMG - Multiple detrusor contractions. S1 194 cc. S2 261 cc. Cystometric capacity 258.6 cc. LPP 54 cm H20. Had a detrusor contraction with the valsalva at max capacity and lost bladder control.  Pressure flow and UPP not performed.  Mixed incontinence.  Will have her cousin or her cousin's husband at her home to help care for her following surgery.  Patient lives alone and cares for her home including doing heavy yard work.  Thinks she may have sinusitis.  GYNECOLOGIC HISTORY: Patient's last menstrual period was 10/29/2006 (approximate). Contraception:Postmenopausal Menopausal hormone therapy: None Last mammogram: 10-29-13 with PCP;has appt. 01-02-16 at [facility] Last pap smear: 08-10-15 Neg        OB History    Gravida Para Term Preterm AB TAB SAB Ectopic Multiple Living    2 2 2       2          Patient Active Problem List   Diagnosis Date Noted  . Urinary incontinence 11/09/2015  . Vaginal discomfort 08/10/2015  . Rectocele 08/10/2015  . Well adult exam 09/03/2014  . Chronic obstructive airway disease with asthma ([place]) 09/03/2014  . UTI (urinary tract infection) 06/09/2014  . Dysplastic toenail 09/02/2013  . Aphthous ulcer 06/11/2013  . Dark urine 03/18/2013  . Rash 06/08/2012  . PRURITUS 12/01/2010  . EXPOSURE TO HAZARDOUS BODY FLUID, HX OF 05/29/2010  . TOBACCO USER 02/24/2010  . BRONCHITIS, ACUTE 12/05/2009  . HIP PAIN 12/05/2009  . Acute sinusitis 09/02/2009  . RUQ PAIN 02/04/2009  . WRIST PAIN 05/24/2008  . CERVICAL STRAIN 05/24/2008  . WARTS, VIRAL, UNSPECIFIED 04/06/2008  . NEOPLASM, SKIN, UNCERTAIN BEHAVIOR 33/83/2919  . Vitamin D deficiency 11/13/2007  . UPPER RESPIRATORY INFECTION (URI) 11/11/2007  . Hypothyroidism 08/05/2007  . BIPOLAR DEPRESSION 08/05/2007  . GERD 08/05/2007  . Anxiety state 07/23/2007  . LOW BACK PAIN 07/23/2007  . Myalgia and myositis 07/23/2007  . B12 deficiency 05/22/2007  . IRON DEFICIENCY 05/22/2007    Past Medical History  Diagnosis Date  . Depression     bipolar- Dr [name]  . Anxiety   . Low back pain     Dr Arnoldo Morale  . GERD (gastroesophageal reflux disease)   . Vitamin B12 deficiency   . Sinus congestion   . Vertigo   . Fibromyalgia   .  Headache   . Chronic back pain   . Fatigue   . Chronic neck pain   . Thyroid disease     hypothyroidsm  . Osteoarthritis     Past Surgical History  Procedure Laterality Date  . Cervical laminectomy  2001 and 2995    Dr Arnoldo Morale  . Tubal ligation  1980    Current Outpatient Prescriptions  Medication Sig Dispense Refill  . Albuterol Sulfate (PROAIR RESPICLICK) 938 (90 BASE) MCG/ACT AEPB Inhale 1-2 puffs into the lungs 4 (four) times daily as needed. 1 each 11  . ALPRAZolam (XANAX) 1 MG tablet Take 1 mg by mouth 4 (four) times daily.     Marland Kitchen  amphetamine-dextroamphetamine (ADDERALL) 30 MG tablet Take 15-30 mg by mouth 2 (two) times daily as needed (For focus.). Takes one tablet in the morning, may take one-half tablet in the afternoon if needed    . cholecalciferol (VITAMIN D) 1000 units tablet Take 1,000 Units by mouth once a week.    . Cyanocobalamin 1000 MCG/ML KIT Inject 1 mL as directed every 14 (fourteen) days.    Marland Kitchen eletriptan (RELPAX) 20 MG tablet Take 20 mg by mouth every 2 (two) hours as needed for migraine. may repeat in 2 hours if necessary    . fluticasone (FLONASE) 50 MCG/ACT nasal spray USE 2 SPRAYS IN BOTH NOSTRILS DAILY. (Patient taking differently: Place 2 sprays into both nostrils daily. USE 2 SPRAYS IN BOTH NOSTRILS DAILY.) 16 g 11  . levocetirizine (XYZAL) 5 MG tablet Take 1 tablet (5 mg total) by mouth every evening. 90 tablet 3  . levofloxacin (LEVAQUIN) 500 MG tablet Take 1 tablet (500 mg total) by mouth daily. 10 tablet 0  . levothyroxine (SYNTHROID, LEVOTHROID) 50 MCG tablet Take 1 tablet (50 mcg total) by mouth daily before breakfast. 30 tablet 11  . oxyCODONE-acetaminophen (ROXICET) 5-325 MG tablet Take 0.5-1 tablets by mouth 2 (two) times daily as needed for severe pain. Please fill on or after 01/07/2016 60 tablet 0  . pantoprazole (PROTONIX) 40 MG tablet Take 40 mg by mouth daily.    Marland Kitchen Phenylephrine-DM-GG-APAP (MUCINEX SINUS-MAX) 5-10-200-325 MG CAPS Take 2 capsules by mouth 2 (two) times daily as needed (For sinus congestion.).    Marland Kitchen QUEtiapine (SEROQUEL) 25 MG tablet Take 25 mg by mouth at bedtime.     . triamcinolone cream (KENALOG) 0.5 % Apply 1 application topically 3 (three) times daily. 60 g 3   No current facility-administered medications for this visit.     ALLERGIES: Doxycycline hyclate; Nabumetone; Sulfa antibiotics; Tetracycline hcl; and Tramadol hcl  Family History  Problem Relation Age of Onset  . Arthritis Other   . Heart attack Father     dec heart disease age 71  . Hypertension Father    . Cancer Mother     dec stomach ca--age 54  . Breast cancer Paternal Aunt   . Breast cancer Cousin     Social History   Social History  . Marital Status: Married    Spouse Name: N/A  . Number of Children: N/A  . Years of Education: N/A   Occupational History  . Not on file.   Social History Main Topics  . Smoking status: Former Smoker -- 0.50 packs/day    Types: Cigarettes    Quit date: 05/06/2011  . Smokeless tobacco: Never Used  . Alcohol Use: No  . Drug Use: No  . Sexual Activity: Yes    Birth Control/ Protection: Post-menopausal, Surgical  Comment: Tubal   Other Topics Concern  . Not on file   Social History Narrative    ROS:  Pertinent items are noted in HPI.  PHYSICAL EXAMINATION:    BP 102/70 mmHg  Pulse 84  Resp 18  Ht 5' 4"  (1.626 m)  Wt 141 lb 3.2 oz (64.048 kg)  BMI 24.23 kg/m2  LMP 10/29/2006 (Approximate)    General appearance: alert, cooperative and appears stated age Head: Normocephalic, without obvious abnormality, atraumatic Neck: no adenopathy, supple, symmetrical, trachea midline and thyroid normal to inspection and palpation Lungs: clear to auscultation bilaterally  Heart: regular rate and rhythm Abdomen: soft, non-tender; bowel sounds normal; no masses,  no organomegaly Extremities: extremities normal, atraumatic, no cyanosis or edema Skin: Skin color, texture, turgor normal. No rashes or lesions Lymph nodes: Cervical, supraclavicular, and axillary nodes normal. No abnormal inguinal nodes palpated Neurologic: Grossly normal  Pelvic: External genitalia:  no lesions              Urethra:  normal appearing urethra with no masses, tenderness or lesions              Bartholins and Skenes: normal                 Vagina: normal appearing vagina with normal color and discharge, no lesions.  third degree cystocele, second degree uterine prolapse, first degree rectocele.              Cervix: no lesions                Bimanual Exam:   Uterus:  normal size, contour, position, consistency, mobility, non-tender              Adnexa: no mass, fullness, tenderness              Rectovaginal: Yes.  .  Confirms.              Anus:  normal sphincter tone, no lesions  Chaperone was present for exam.  ASSESSMENT  Incomplete uterovaginal prolapse.  Mixed incontinence.  Asthma.  Chronic neck and back pain. Hx of cervical laminectomy.  Possible sinusitis.   PLAN  Counseled regarding procedure total abdominal hysterectomy with bilateral salpingo-oophorectomy, abdominal sacrocolpopexy, Halban's culdoplasty, anterior and posterior colporrhaphy, and TVT Exact midurethral sling and cystoscopy.   Discussion about the permanent nature of the mesh material used for the sacrocolpopexy and the midurethral sling.  I did discuss again risk of erosions and exposures that can cause damage to surrounding organs. We have discussed risks, benefits, and alternatives to surgery in detail.   Patient expressing appreciation for her care and the time spent reviewing and discussing surgery, objectives, potential risks and expectations, and recovery process.  She will follow up with her PCP regarding her potential sinusitis in order to receive treatment prior to surgery.   An After Visit Summary was printed and given to the patient.  ___60___ minutes face to face time of which over 50% was spent in counseling.

## 2015-12-28 NOTE — Telephone Encounter (Signed)
Pt informed

## 2016-01-02 ENCOUNTER — Ambulatory Visit (HOSPITAL_COMMUNITY)
Admission: RE | Admit: 2016-01-02 | Discharge: 2016-01-02 | Disposition: A | Discharge status: Home / Self Care | Payer: Medicare Other | Source: Ambulatory Visit | Attending: Obstetrics and Gynecology | Admitting: Obstetrics and Gynecology

## 2016-01-02 DIAGNOSIS — Z1231 Encounter for screening mammogram for malignant neoplasm of breast: Secondary | ICD-10-CM | POA: Diagnosis not present

## 2016-01-05 ENCOUNTER — Other Ambulatory Visit: Payer: Self-pay | Admitting: Obstetrics and Gynecology

## 2016-01-05 ENCOUNTER — Telehealth: Payer: Self-pay | Admitting: Emergency Medicine

## 2016-01-05 DIAGNOSIS — R921 Mammographic calcification found on diagnostic imaging of breast: Secondary | ICD-10-CM

## 2016-01-05 DIAGNOSIS — N6310 Unspecified lump in the right breast, unspecified quadrant: Secondary | ICD-10-CM

## 2016-01-05 NOTE — Telephone Encounter (Signed)
Lamont Snowball, RN scheduled patient at [facility] for Diagnostic imaging and moved patient's pre-admission testing appointment to [facility] to 11:15 for tomorrow 01/06/16.   Contacted patient and she is advised of area of R Breast That requires additional imaging. Advised that the only time available at [facility] is 1:15, thus her appointment at [facility] hospital had to be moved to accommodate follow up diagnostic breast imaging.   Patient is given information regarding appointment at [facility] and address and directions given.  Patient is aware of appointment for pre-admissions testing at [facility] hospital. Patient is advised that she will know results of imaging tomorrow and Dr. Quincy Simmonds will review when they are available and any changes to surgical plans ONLY if necessary will be made by Dr. Quincy Simmonds and communicated to her.   She is agreeable to instructions and verbalized understanding of results and appointments as scheduled.  Routing to provider for final review. Patient agreeable to disposition. Will close encounter.

## 2016-01-06 ENCOUNTER — Inpatient Hospital Stay (HOSPITAL_COMMUNITY): Admission: RE | Admit: 2016-01-06 | Payer: Medicare Other | Source: Ambulatory Visit

## 2016-01-06 ENCOUNTER — Ambulatory Visit
Admission: RE | Admit: 2016-01-06 | Discharge: 2016-01-06 | Disposition: A | Discharge status: Home / Self Care | Payer: Medicare Other | Source: Ambulatory Visit | Attending: Obstetrics and Gynecology | Admitting: Obstetrics and Gynecology

## 2016-01-06 ENCOUNTER — Other Ambulatory Visit: Payer: Self-pay | Admitting: Obstetrics and Gynecology

## 2016-01-06 ENCOUNTER — Other Ambulatory Visit: Payer: Self-pay

## 2016-01-06 ENCOUNTER — Telehealth: Payer: Self-pay | Admitting: Obstetrics and Gynecology

## 2016-01-06 ENCOUNTER — Encounter (HOSPITAL_COMMUNITY)
Admission: RE | Admit: 2016-01-06 | Discharge: 2016-01-06 | Disposition: A | Discharge status: Home / Self Care | Payer: Medicare Other | Source: Ambulatory Visit | Attending: Obstetrics and Gynecology | Admitting: Obstetrics and Gynecology

## 2016-01-06 ENCOUNTER — Encounter (HOSPITAL_COMMUNITY): Payer: Self-pay

## 2016-01-06 DIAGNOSIS — Z01812 Encounter for preprocedural laboratory examination: Secondary | ICD-10-CM | POA: Insufficient documentation

## 2016-01-06 DIAGNOSIS — N6310 Unspecified lump in the right breast, unspecified quadrant: Secondary | ICD-10-CM

## 2016-01-06 DIAGNOSIS — R921 Mammographic calcification found on diagnostic imaging of breast: Secondary | ICD-10-CM | POA: Diagnosis not present

## 2016-01-06 DIAGNOSIS — Z01810 Encounter for preprocedural cardiovascular examination: Secondary | ICD-10-CM | POA: Diagnosis not present

## 2016-01-06 HISTORY — DX: Pneumonia, unspecified organism: J18.9

## 2016-01-06 HISTORY — DX: Hypothyroidism, unspecified: E03.9

## 2016-01-06 LAB — COMPREHENSIVE METABOLIC PANEL
ALBUMIN: 4.3 g/dL (ref 3.5–5.0)
ALK PHOS: 54 U/L (ref 38–126)
ALT: 13 U/L — ABNORMAL LOW (ref 14–54)
ANION GAP: 6 (ref 5–15)
AST: 18 U/L (ref 15–41)
BUN: 13 mg/dL (ref 6–20)
CALCIUM: 9.4 mg/dL (ref 8.9–10.3)
CHLORIDE: 107 mmol/L (ref 101–111)
CO2: 30 mmol/L (ref 22–32)
Creatinine, Ser: 0.83 mg/dL (ref 0.44–1.00)
GFR calc Af Amer: >60 mL/min (ref >60)
GFR calc non Af Amer: >60 mL/min (ref >60)
GLUCOSE: 108 mg/dL — AB (ref 65–99)
Potassium: 3.8 mmol/L (ref 3.5–5.1)
SODIUM: 143 mmol/L (ref 135–145)
Total Bilirubin: 1.2 mg/dL (ref 0.3–1.2)
Total Protein: 7.4 g/dL (ref 6.5–8.1)

## 2016-01-06 LAB — CBC
HCT: 40.6 % (ref 36.0–46.0)
Hemoglobin: 13.8 g/dL (ref 12.0–15.0)
MCH: 29.5 pg (ref 26.0–34.0)
MCHC: 34 g/dL (ref 30.0–36.0)
MCV: 86.8 fL (ref 78.0–100.0)
Platelets: 159 10*3/uL (ref 150–400)
RBC: 4.68 MIL/uL (ref 3.87–5.11)
RDW: 13.3 % (ref 11.5–15.5)
WBC: 5.2 10*3/uL (ref 4.0–10.5)

## 2016-01-06 NOTE — Telephone Encounter (Signed)
Patient calling to speak with nurse regarding the testing she had done today.

## 2016-01-06 NOTE — Telephone Encounter (Signed)
Spoke with patient. Advised of results as seen below from [place]. She is agreeable and verbalizes understanding.  Cc: Karen Chafe, RN for mammogram recall  Routing to provider for final review. Patient agreeable to disposition. Will close encounter.

## 2016-01-06 NOTE — Telephone Encounter (Signed)
Patient is scheduled for surgery with Dr.Silva on 01/10/2016 for an abdominal hysterectomy. Patient was seen for right breast diagnostic mammogram today 01/06/2016 from follow up of screening mammogram on 01/05/2016. Routing to [place] for review and advise of results as Dr.Silva wanted to review these prior to surgery.

## 2016-01-06 NOTE — Telephone Encounter (Signed)
Please inform patient.   Mammogram report reviewed. Probable benign calcifications of right breast.   Place in recall for September 2017.   Ok to proceed with surgery.

## 2016-01-06 NOTE — Patient Instructions (Addendum)
   Your procedure is scheduled on: MARCH 14 (TUESDAY)  Enter through the Main Entrance of [facility] at: 6AM  Pick up the phone at the desk and dial [phone] and inform us of your arrival.  Please call this number if you have any problems the morning of surgery: [phone]  DO NOT EAT OR DRINK AFTER MIDNIGHT MARCH 13 (MONDAY)  Take these medicines the morning of surgery with a SIP OF WATER:  BRING INHALER DAY OF SURGERY... TAKE PROTONIX ( OR BRING WITH YOU)   TAKE  THYROID MED AM OF SURGERY  Do not wear jewelry, make-up, or FINGER nail polish No metal in your hair or on your body. Do not wear lotions, powders, perfumes.  You may wear deodorant.  Do not bring valuables to the hospital. Contacts, dentures or bridgework may not be worn into surgery.  Leave suitcase in the car. After Surgery it may be brought to your room. For patients being admitted to the hospital, checkout time is 11:00am the day of discharge.

## 2016-01-06 NOTE — Telephone Encounter (Signed)
04 recall placed for 6 months.

## 2016-01-09 MED ORDER — DEXTROSE 5 % IV SOLN
2.0000 g | INTRAVENOUS | Status: AC
  Administered 2016-01-10: 2 g via INTRAVENOUS
  Filled 2016-01-09: qty 2

## 2016-01-09 NOTE — H&P (Signed)
Progress Notes      Nunzio Cobbs, MD at 12/28/2015 2:23 PM     Status: Signed       Expand All Collapse All   Patient ID: Melody [name], female DOB: 26-Oct-1956, 60 y.o. MRN: [id number] GYNECOLOGY VISIT  HPI: 60 y.o. Married Caucasian female  G2P2002 with Patient's last menstrual period was 10/29/2006 (approximate).  here for surgical consult.   Has uterovaginal prolapse, leakage of urine, and fecal incontinence.  Patient is thinking about potential surgery.  Has been watching U Tube videos about it.   Metamucil made the fecal incontinence worse. Was having a lot of bowel movements. Stopped Metamucil. Bowel function is now much more normal.  DF - voiding frequency difficult for patient to express.  NF - none.  Can leak urine without warning.  Cold can bring on urge to void.  No urge to empty bladder with water running.  Can leak if coughs really deep.  No leak with sneeze or laugh.  Can leak urine if doing heavy yard work like chopping wood.   On prior physical exam is noted to have a third degree cystocele. Almost second degree uterine prolapse, first degree rectocele.  Multichannel testing using pessary for reduction of prolapse:  Uroflow - continuous. Void 90 cc. PVR 40 cc.  CMG - Multiple detrusor contractions. S1 194 cc. S2 261 cc. Cystometric capacity 258.6 cc. LPP 54 cm H20. Had a detrusor contraction with the valsalva at max capacity and lost bladder control.  Pressure flow and UPP not performed.  Mixed incontinence.  Will have her cousin or her cousin's husband at her home to help care for her following surgery.  Patient lives alone and cares for her home including doing heavy yard work.  Thinks she may have sinusitis.  GYNECOLOGIC HISTORY: Patient's last menstrual period was 10/29/2006 (approximate). Contraception:Postmenopausal Menopausal hormone therapy: None Last mammogram: 10-29-13 with PCP;has [facility]t. 01-02-16  [facility] Last pap smear: 08-10-15 Neg   OB History    Gravida Para Term Preterm AB TAB SAB Ectopic Multiple Living   2 2 2       2        Patient Active Problem List   Diagnosis Date Noted  . Urinary incontinence 11/09/2015  . Vaginal discomfort 08/10/2015  . Rectocele 08/10/2015  . Well adult exam 09/03/2014  . Chronic obstructive airway disease with asthma ([place]) 09/03/2014  . UTI (urinary tract infection) 06/09/2014  . Dysplastic toenail 09/02/2013  . Aphthous ulcer 06/11/2013  . Dark urine 03/18/2013  . Rash 06/08/2012  . PRURITUS 12/01/2010  . EXPOSURE TO HAZARDOUS BODY FLUID, HX OF 05/29/2010  . TOBACCO USER 02/24/2010  . BRONCHITIS, ACUTE 12/05/2009  . HIP PAIN 12/05/2009  . Acute sinusitis 09/02/2009  . RUQ PAIN 02/04/2009  . WRIST PAIN 05/24/2008  . CERVICAL STRAIN 05/24/2008  . WARTS, VIRAL, UNSPECIFIED 04/06/2008  . NEOPLASM, SKIN, UNCERTAIN BEHAVIOR 62/83/1517  . Vitamin D deficiency 11/13/2007  . UPPER RESPIRATORY INFECTION (URI) 11/11/2007  . Hypothyroidism 08/05/2007  . BIPOLAR DEPRESSION 08/05/2007  . GERD 08/05/2007  . Anxiety state 07/23/2007  . LOW BACK PAIN 07/23/2007  . Myalgia and myositis 07/23/2007  . B12 deficiency 05/22/2007  . IRON DEFICIENCY 05/22/2007    Past Medical History  Diagnosis Date  . Depression     bipolar- Dr [name]  . Anxiety   . Low back pain     Dr Arnoldo Morale  . GERD (gastroesophageal reflux disease)   . Vitamin  B12 deficiency   . Sinus congestion   . Vertigo   . Fibromyalgia   . Headache   . Chronic back pain   . Fatigue   . Chronic neck pain   . Thyroid disease     hypothyroidsm  . Osteoarthritis     Past Surgical History  Procedure Laterality Date  . Cervical laminectomy  2001 and 2995    Dr Arnoldo Morale    . Tubal ligation  1980    Current Outpatient Prescriptions  Medication Sig Dispense Refill  . Albuterol Sulfate (PROAIR RESPICLICK) 309 (90 BASE) MCG/ACT AEPB Inhale 1-2 puffs into the lungs 4 (four) times daily as needed. 1 each 11  . ALPRAZolam (XANAX) 1 MG tablet Take 1 mg by mouth 4 (four) times daily.     Marland Kitchen amphetamine-dextroamphetamine (ADDERALL) 30 MG tablet Take 15-30 mg by mouth 2 (two) times daily as needed (For focus.). Takes one tablet in the morning, may take one-half tablet in the afternoon if needed    . cholecalciferol (VITAMIN D) 1000 units tablet Take 1,000 Units by mouth once a week.    . Cyanocobalamin 1000 MCG/ML KIT Inject 1 mL as directed every 14 (fourteen) days.    Marland Kitchen eletriptan (RELPAX) 20 MG tablet Take 20 mg by mouth every 2 (two) hours as needed for migraine. may repeat in 2 hours if necessary    . fluticasone (FLONASE) 50 MCG/ACT nasal spray USE 2 SPRAYS IN BOTH NOSTRILS DAILY. (Patient taking differently: Place 2 sprays into both nostrils daily. USE 2 SPRAYS IN BOTH NOSTRILS DAILY.) 16 g 11  . levocetirizine (XYZAL) 5 MG tablet Take 1 tablet (5 mg total) by mouth every evening. 90 tablet 3  . levofloxacin (LEVAQUIN) 500 MG tablet Take 1 tablet (500 mg total) by mouth daily. 10 tablet 0  . levothyroxine (SYNTHROID, LEVOTHROID) 50 MCG tablet Take 1 tablet (50 mcg total) by mouth daily before breakfast. 30 tablet 11  . oxyCODONE-acetaminophen (ROXICET) 5-325 MG tablet Take 0.5-1 tablets by mouth 2 (two) times daily as needed for severe pain. Please fill on or after 01/07/2016 60 tablet 0  . pantoprazole (PROTONIX) 40 MG tablet Take 40 mg by mouth daily.    Marland Kitchen Phenylephrine-DM-GG-APAP (MUCINEX SINUS-MAX) 5-10-200-325 MG CAPS Take 2 capsules by mouth 2 (two) times daily as needed (For sinus congestion.).    Marland Kitchen QUEtiapine (SEROQUEL) 25 MG tablet Take 25 mg by mouth at bedtime.     .  triamcinolone cream (KENALOG) 0.5 % Apply 1 application topically 3 (three) times daily. 60 g 3   No current facility-administered medications for this visit.     ALLERGIES: Doxycycline hyclate; Nabumetone; Sulfa antibiotics; Tetracycline hcl; and Tramadol hcl  Family History  Problem Relation Age of Onset  . Arthritis Other   . Heart attack Father     dec heart disease age 30  . Hypertension Father   . Cancer Mother     dec stomach ca--age 10  . Breast cancer Paternal Aunt   . Breast cancer Cousin     Social History   Social History  . Marital Status: Married    Spouse Name: N/A  . Number of Children: N/A  . Years of Education: N/A   Occupational History  . Not on file.   Social History Main Topics  . Smoking status: Former Smoker -- 0.50 packs/day    Types: Cigarettes    Quit date: 05/06/2011  . Smokeless tobacco: Never Used  . Alcohol Use: No  .  Drug Use: No  . Sexual Activity: Yes    Birth Control/ Protection: Post-menopausal, Surgical     Comment: Tubal   Other Topics Concern  . Not on file   Social History Narrative    ROS: Pertinent items are noted in HPI.  PHYSICAL EXAMINATION:   BP 102/70 mmHg  Pulse 84  Resp 18  Ht 5' 4"  (1.626 m)  Wt 141 lb 3.2 oz (64.048 kg)  BMI 24.23 kg/m2  LMP 10/29/2006 (Approximate)  General appearance: alert, cooperative and appears stated age Head: Normocephalic, without obvious abnormality, atraumatic Neck: no adenopathy, supple, symmetrical, trachea midline and thyroid normal to inspection and palpation Lungs: clear to auscultation bilaterally  Heart: regular rate and rhythm Abdomen: soft, non-tender; bowel sounds normal; no masses, no organomegaly Extremities: extremities normal, atraumatic, no cyanosis or edema Skin: Skin color, texture, turgor normal. No rashes or lesions Lymph nodes: Cervical,  supraclavicular, and axillary nodes normal. No abnormal inguinal nodes palpated Neurologic: Grossly normal  Pelvic: External genitalia: no lesions  Urethra: normal appearing urethra with no masses, tenderness or lesions  Bartholins and Skenes: normal   Vagina: normal appearing vagina with normal color and discharge, no lesions. third degree cystocele, second degree uterine prolapse, first degree rectocele.  Cervix: no lesions   Bimanual Exam: Uterus: normal size, contour, position, consistency, mobility, non-tender  Adnexa: no mass, fullness, tenderness  Rectovaginal: Yes. . Confirms.  Anus: normal sphincter tone, no lesions  Chaperone was present for exam.  ASSESSMENT  Incomplete uterovaginal prolapse.  Mixed incontinence.  Asthma.  Chronic neck and back pain. Hx of cervical laminectomy.  Possible sinusitis.   PLAN  Counseled regarding procedure total abdominal hysterectomy with bilateral salpingo-oophorectomy, abdominal sacrocolpopexy, Halban's culdoplasty, anterior and posterior colporrhaphy, and TVT Exact midurethral sling and cystoscopy.  Discussion about the permanent nature of the mesh material used for the sacrocolpopexy and the midurethral sling. I did discuss again risk of erosions and exposures that can cause damage to surrounding organs. We have discussed risks, benefits, and alternatives to surgery in detail.   Patient expressing appreciation for her care and the time spent reviewing and discussing surgery, objectives, potential risks and expectations, and recovery process.  She will follow up with her PCP regarding her potential sinusitis in order to receive treatment prior to surgery.  An After Visit Summary was printed and given to the patient.  ___60___ minutes face to face time of which over 50% was spent in counseling.             Revision History       Date/Time User Action    > 01/01/2016 6:58 AM Nunzio Cobbs, MD Sign     12/28/2015 2:26 PM Lowella Fairy, CMA Sign at close encounter              Michele Mcalpine, RN at 12/28/2015 4:27 PM     Status: Signed       Expand All Collapse All   Patient given pre-op instructions at office visit verbally and in writing (see letter dated 12/28/15). She is given printed appointment information and verbalizes understanding of all instructions. She is given pre-op instructions for Fleets enema prior to surgery and instructions on use.   Mammogram has been scheduled at [facility].

## 2016-01-10 ENCOUNTER — Inpatient Hospital Stay (HOSPITAL_COMMUNITY): Payer: Medicare Other | Admitting: Anesthesiology

## 2016-01-10 ENCOUNTER — Encounter (HOSPITAL_COMMUNITY)
Admission: RE | Disposition: A | Discharge status: Home / Self Care | Payer: Self-pay | Source: Ambulatory Visit | Attending: Obstetrics and Gynecology

## 2016-01-10 ENCOUNTER — Encounter (HOSPITAL_COMMUNITY): Payer: Self-pay | Admitting: Anesthesiology

## 2016-01-10 ENCOUNTER — Inpatient Hospital Stay (HOSPITAL_COMMUNITY)
Admission: RE | Admit: 2016-01-10 | Discharge: 2016-01-12 | DRG: 743 | Disposition: A | Discharge status: Home / Self Care | Payer: Medicare Other | Source: Ambulatory Visit | Attending: Obstetrics and Gynecology | Admitting: Obstetrics and Gynecology

## 2016-01-10 DIAGNOSIS — E039 Hypothyroidism, unspecified: Secondary | ICD-10-CM | POA: Diagnosis present

## 2016-01-10 DIAGNOSIS — K219 Gastro-esophageal reflux disease without esophagitis: Secondary | ICD-10-CM | POA: Diagnosis present

## 2016-01-10 DIAGNOSIS — N812 Incomplete uterovaginal prolapse: Secondary | ICD-10-CM | POA: Diagnosis not present

## 2016-01-10 DIAGNOSIS — Z9079 Acquired absence of other genital organ(s): Secondary | ICD-10-CM

## 2016-01-10 DIAGNOSIS — J449 Chronic obstructive pulmonary disease, unspecified: Secondary | ICD-10-CM | POA: Diagnosis present

## 2016-01-10 DIAGNOSIS — G8929 Other chronic pain: Secondary | ICD-10-CM | POA: Diagnosis not present

## 2016-01-10 DIAGNOSIS — N814 Uterovaginal prolapse, unspecified: Secondary | ICD-10-CM | POA: Diagnosis not present

## 2016-01-10 DIAGNOSIS — N83201 Unspecified ovarian cyst, right side: Secondary | ICD-10-CM | POA: Diagnosis present

## 2016-01-10 DIAGNOSIS — R159 Full incontinence of feces: Secondary | ICD-10-CM | POA: Diagnosis not present

## 2016-01-10 DIAGNOSIS — M797 Fibromyalgia: Secondary | ICD-10-CM | POA: Diagnosis not present

## 2016-01-10 DIAGNOSIS — Z90722 Acquired absence of ovaries, bilateral: Secondary | ICD-10-CM

## 2016-01-10 DIAGNOSIS — F418 Other specified anxiety disorders: Secondary | ICD-10-CM | POA: Diagnosis not present

## 2016-01-10 DIAGNOSIS — Z87891 Personal history of nicotine dependence: Secondary | ICD-10-CM

## 2016-01-10 DIAGNOSIS — N813 Complete uterovaginal prolapse: Secondary | ICD-10-CM | POA: Diagnosis not present

## 2016-01-10 DIAGNOSIS — N3946 Mixed incontinence: Secondary | ICD-10-CM | POA: Diagnosis not present

## 2016-01-10 DIAGNOSIS — M545 Low back pain: Secondary | ICD-10-CM | POA: Diagnosis not present

## 2016-01-10 DIAGNOSIS — E538 Deficiency of other specified B group vitamins: Secondary | ICD-10-CM | POA: Diagnosis not present

## 2016-01-10 DIAGNOSIS — N816 Rectocele: Secondary | ICD-10-CM | POA: Diagnosis not present

## 2016-01-10 DIAGNOSIS — Z90710 Acquired absence of both cervix and uterus: Secondary | ICD-10-CM

## 2016-01-10 DIAGNOSIS — N393 Stress incontinence (female) (male): Secondary | ICD-10-CM | POA: Diagnosis not present

## 2016-01-10 DIAGNOSIS — E079 Disorder of thyroid, unspecified: Secondary | ICD-10-CM | POA: Diagnosis not present

## 2016-01-10 DIAGNOSIS — J45909 Unspecified asthma, uncomplicated: Secondary | ICD-10-CM | POA: Diagnosis present

## 2016-01-10 DIAGNOSIS — M549 Dorsalgia, unspecified: Secondary | ICD-10-CM | POA: Diagnosis not present

## 2016-01-10 DIAGNOSIS — M542 Cervicalgia: Secondary | ICD-10-CM | POA: Diagnosis present

## 2016-01-10 HISTORY — PX: BLADDER SUSPENSION: SHX72

## 2016-01-10 HISTORY — PX: SALPINGOOPHORECTOMY: SHX82

## 2016-01-10 HISTORY — PX: ABDOMINAL SACROCOLPOPEXY: SHX1114

## 2016-01-10 HISTORY — PX: CYSTO: SHX6284

## 2016-01-10 HISTORY — PX: ANTERIOR AND POSTERIOR REPAIR: SHX5121

## 2016-01-10 HISTORY — PX: ABDOMINAL HYSTERECTOMY: SHX81

## 2016-01-10 SURGERY — HYSTERECTOMY, ABDOMINAL
Anesthesia: General | Site: Vagina

## 2016-01-10 MED ORDER — LIDOCAINE HCL (CARDIAC) 20 MG/ML IV SOLN
INTRAVENOUS | Status: AC
  Filled 2016-01-10: qty 5

## 2016-01-10 MED ORDER — ESTRADIOL 0.1 MG/GM VA CREA
TOPICAL_CREAM | VAGINAL | Status: AC
  Filled 2016-01-10: qty 42.5

## 2016-01-10 MED ORDER — PHENYLEPHRINE-DM-GG-APAP 5-10-200-325 MG PO CAPS: 2.0000 | ORAL_CAPSULE | Freq: Two times a day (BID) | ORAL | Status: DC | PRN

## 2016-01-10 MED ORDER — FENTANYL CITRATE (PF) 250 MCG/5ML IJ SOLN
INTRAMUSCULAR | Status: AC
  Filled 2016-01-10: qty 5

## 2016-01-10 MED ORDER — AMPHETAMINE-DEXTROAMPHETAMINE 30 MG PO TABS: 15.0000 mg | ORAL_TABLET | Freq: Two times a day (BID) | ORAL | Status: DC | PRN

## 2016-01-10 MED ORDER — DIPHENHYDRAMINE HCL 50 MG/ML IJ SOLN: 12.5000 mg | Freq: Four times a day (QID) | INTRAMUSCULAR | Status: DC | PRN

## 2016-01-10 MED ORDER — QUETIAPINE FUMARATE 25 MG PO TABS
25.0000 mg | ORAL_TABLET | Freq: Every day | ORAL | Status: DC
  Administered 2016-01-10 – 2016-01-11 (×2): 25 mg via ORAL
  Filled 2016-01-10 (×2): qty 1

## 2016-01-10 MED ORDER — MEPERIDINE HCL 25 MG/ML IJ SOLN
INTRAMUSCULAR | Status: AC
  Filled 2016-01-10: qty 1

## 2016-01-10 MED ORDER — SCOPOLAMINE 1 MG/3DAYS TD PT72: 1.0000 | MEDICATED_PATCH | Freq: Once | TRANSDERMAL | Status: DC

## 2016-01-10 MED ORDER — LEVOTHYROXINE SODIUM 50 MCG PO TABS
50.0000 ug | ORAL_TABLET | Freq: Every day | ORAL | Status: DC
  Administered 2016-01-11 – 2016-01-12 (×2): 50 ug via ORAL
  Filled 2016-01-10 (×2): qty 1

## 2016-01-10 MED ORDER — ATROPINE SULFATE 0.4 MG/ML IJ SOLN
INTRAMUSCULAR | Status: AC
  Filled 2016-01-10: qty 1

## 2016-01-10 MED ORDER — NEOSTIGMINE METHYLSULFATE 10 MG/10ML IV SOLN
INTRAVENOUS | Status: AC
  Filled 2016-01-10: qty 1

## 2016-01-10 MED ORDER — FLUTICASONE PROPIONATE 50 MCG/ACT NA SUSP: 2.0000 | Freq: Every evening | NASAL | Status: DC | PRN

## 2016-01-10 MED ORDER — HYDROMORPHONE HCL 1 MG/ML IJ SOLN
0.2500 mg | INTRAMUSCULAR | Status: DC | PRN
  Administered 2016-01-10 (×2): 0.25 mg via INTRAVENOUS
  Administered 2016-01-10 (×2): 0.5 mg via INTRAVENOUS

## 2016-01-10 MED ORDER — ALPRAZOLAM 0.5 MG PO TABS
1.0000 mg | ORAL_TABLET | Freq: Four times a day (QID) | ORAL | Status: DC | PRN
  Administered 2016-01-10 – 2016-01-12 (×6): 1 mg via ORAL
  Filled 2016-01-10 (×6): qty 2

## 2016-01-10 MED ORDER — MIDAZOLAM HCL 2 MG/2ML IJ SOLN
INTRAMUSCULAR | Status: DC | PRN
Start: 2016-01-10 — End: 2016-01-10
  Administered 2016-01-10: 1 mg via INTRAVENOUS

## 2016-01-10 MED ORDER — HYDROMORPHONE 1 MG/ML IV SOLN
INTRAVENOUS | Status: DC
  Administered 2016-01-10: 14:00:00 via INTRAVENOUS
  Administered 2016-01-10: 2.4 mg via INTRAVENOUS
  Administered 2016-01-11: 0.9 mg via INTRAVENOUS
  Administered 2016-01-11: 1.4 mg via INTRAVENOUS
  Administered 2016-01-11: 0.3 mg via INTRAVENOUS
  Filled 2016-01-10: qty 25

## 2016-01-10 MED ORDER — LACTATED RINGERS IV SOLN: INTRAVENOUS | Status: DC

## 2016-01-10 MED ORDER — LIDOCAINE HCL (CARDIAC) 20 MG/ML IV SOLN
INTRAVENOUS | Status: DC | PRN
  Administered 2016-01-10: 100 mg via INTRAVENOUS

## 2016-01-10 MED ORDER — LACTATED RINGERS IV SOLN
INTRAVENOUS | Status: DC
  Administered 2016-01-10: 19:00:00 via INTRAVENOUS

## 2016-01-10 MED ORDER — GLYCOPYRROLATE 0.2 MG/ML IJ SOLN
INTRAMUSCULAR | Status: AC
  Filled 2016-01-10: qty 3

## 2016-01-10 MED ORDER — ALBUTEROL SULFATE (2.5 MG/3ML) 0.083% IN NEBU: 3.0000 mL | INHALATION_SOLUTION | Freq: Four times a day (QID) | RESPIRATORY_TRACT | Status: DC | PRN

## 2016-01-10 MED ORDER — SCOPOLAMINE 1 MG/3DAYS TD PT72
MEDICATED_PATCH | TRANSDERMAL | Status: AC
  Filled 2016-01-10: qty 1

## 2016-01-10 MED ORDER — SUCCINYLCHOLINE CHLORIDE 20 MG/ML IJ SOLN
INTRAMUSCULAR | Status: AC
  Filled 2016-01-10: qty 1

## 2016-01-10 MED ORDER — PROPOFOL 10 MG/ML IV BOLUS
INTRAVENOUS | Status: AC
  Filled 2016-01-10: qty 20

## 2016-01-10 MED ORDER — EPHEDRINE SULFATE 50 MG/ML IJ SOLN
INTRAMUSCULAR | Status: DC | PRN
  Administered 2016-01-10 (×2): 10 mg via INTRAVENOUS
  Administered 2016-01-10: 5 mg via INTRAVENOUS

## 2016-01-10 MED ORDER — ROCURONIUM BROMIDE 100 MG/10ML IV SOLN
INTRAVENOUS | Status: AC
  Filled 2016-01-10: qty 1

## 2016-01-10 MED ORDER — HYDROMORPHONE HCL 1 MG/ML IJ SOLN
INTRAMUSCULAR | Status: AC
  Filled 2016-01-10: qty 1

## 2016-01-10 MED ORDER — ONDANSETRON HCL 4 MG/2ML IJ SOLN: 4.0000 mg | Freq: Four times a day (QID) | INTRAMUSCULAR | Status: DC | PRN

## 2016-01-10 MED ORDER — ESTRADIOL 0.1 MG/GM VA CREA
TOPICAL_CREAM | VAGINAL | Status: DC | PRN
  Administered 2016-01-10: 1 via VAGINAL

## 2016-01-10 MED ORDER — ACETAMINOPHEN 10 MG/ML IV SOLN
1000.0000 mg | Freq: Once | INTRAVENOUS | Status: AC
  Administered 2016-01-10: 1000 mg via INTRAVENOUS
  Filled 2016-01-10: qty 100

## 2016-01-10 MED ORDER — METOCLOPRAMIDE HCL 5 MG/ML IJ SOLN: 10.0000 mg | Freq: Once | INTRAMUSCULAR | Status: DC | PRN

## 2016-01-10 MED ORDER — ELETRIPTAN HYDROBROMIDE 20 MG PO TABS
20.0000 mg | ORAL_TABLET | ORAL | Status: DC | PRN
  Filled 2016-01-10: qty 1

## 2016-01-10 MED ORDER — DIPHENHYDRAMINE HCL 12.5 MG/5ML PO ELIX: 12.5000 mg | ORAL_SOLUTION | Freq: Four times a day (QID) | ORAL | Status: DC | PRN

## 2016-01-10 MED ORDER — AMPHETAMINE-DEXTROAMPHETAMINE 5 MG PO TABS
15.0000 mg | ORAL_TABLET | Freq: Two times a day (BID) | ORAL | Status: DC | PRN
Start: 2016-01-10 — End: 2016-01-11
  Filled 2016-01-10: qty 1

## 2016-01-10 MED ORDER — BUPIVACAINE HCL (PF) 0.25 % IJ SOLN
INTRAMUSCULAR | Status: AC
  Filled 2016-01-10: qty 30

## 2016-01-10 MED ORDER — HYDROMORPHONE HCL 1 MG/ML IJ SOLN
INTRAMUSCULAR | Status: AC
  Administered 2016-01-10: 0.25 mg via INTRAVENOUS
  Filled 2016-01-10: qty 1

## 2016-01-10 MED ORDER — SUCCINYLCHOLINE CHLORIDE 20 MG/ML IJ SOLN
INTRAMUSCULAR | Status: DC | PRN
  Administered 2016-01-10: 100 mg via INTRAVENOUS

## 2016-01-10 MED ORDER — DEXAMETHASONE SODIUM PHOSPHATE 10 MG/ML IJ SOLN
INTRAMUSCULAR | Status: AC
  Filled 2016-01-10: qty 1

## 2016-01-10 MED ORDER — MENTHOL 3 MG MT LOZG
1.0000 | LOZENGE | OROMUCOSAL | Status: DC | PRN
  Administered 2016-01-11: 3 mg via ORAL
  Filled 2016-01-10: qty 9

## 2016-01-10 MED ORDER — DEXAMETHASONE SODIUM PHOSPHATE 10 MG/ML IJ SOLN
INTRAMUSCULAR | Status: DC | PRN
  Administered 2016-01-10: 10 mg via INTRAVENOUS

## 2016-01-10 MED ORDER — NALOXONE HCL 0.4 MG/ML IJ SOLN: 0.4000 mg | INTRAMUSCULAR | Status: DC | PRN

## 2016-01-10 MED ORDER — LACTATED RINGERS IV SOLN
INTRAVENOUS | Status: DC
  Administered 2016-01-10 (×3): via INTRAVENOUS

## 2016-01-10 MED ORDER — EPHEDRINE 5 MG/ML INJ
INTRAVENOUS | Status: AC
  Filled 2016-01-10: qty 10

## 2016-01-10 MED ORDER — PHENYLEPHRINE 40 MCG/ML (10ML) SYRINGE FOR IV PUSH (FOR BLOOD PRESSURE SUPPORT)
PREFILLED_SYRINGE | INTRAVENOUS | Status: AC
  Filled 2016-01-10: qty 10

## 2016-01-10 MED ORDER — PROPOFOL 10 MG/ML IV BOLUS
INTRAVENOUS | Status: DC | PRN
  Administered 2016-01-10: 50 mg via INTRAVENOUS
  Administered 2016-01-10: 180 mg via INTRAVENOUS

## 2016-01-10 MED ORDER — LORATADINE 10 MG PO TABS
10.0000 mg | ORAL_TABLET | Freq: Every day | ORAL | Status: DC | PRN
  Filled 2016-01-10: qty 1

## 2016-01-10 MED ORDER — SODIUM CHLORIDE 0.9% FLUSH: 9.0000 mL | INTRAVENOUS | Status: DC | PRN

## 2016-01-10 MED ORDER — PHENYLEPHRINE HCL 10 MG/ML IJ SOLN
INTRAMUSCULAR | Status: DC | PRN
  Administered 2016-01-10 (×2): .08 mg via INTRAVENOUS

## 2016-01-10 MED ORDER — ONDANSETRON HCL 4 MG/2ML IJ SOLN
INTRAMUSCULAR | Status: AC
  Filled 2016-01-10: qty 2

## 2016-01-10 MED ORDER — GLYCOPYRROLATE 0.2 MG/ML IJ SOLN
INTRAMUSCULAR | Status: DC | PRN
  Administered 2016-01-10: .5 mg via INTRAVENOUS

## 2016-01-10 MED ORDER — FENTANYL CITRATE (PF) 100 MCG/2ML IJ SOLN
INTRAMUSCULAR | Status: DC | PRN
  Administered 2016-01-10 (×8): 50 ug via INTRAVENOUS

## 2016-01-10 MED ORDER — MIDAZOLAM HCL 2 MG/2ML IJ SOLN
INTRAMUSCULAR | Status: AC
  Filled 2016-01-10: qty 2

## 2016-01-10 MED ORDER — MEPERIDINE HCL 25 MG/ML IJ SOLN
6.2500 mg | INTRAMUSCULAR | Status: DC | PRN
  Administered 2016-01-10: 12.5 mg via INTRAVENOUS

## 2016-01-10 MED ORDER — OXYCODONE-ACETAMINOPHEN 5-325 MG PO TABS: 1.0000 | ORAL_TABLET | ORAL | Status: DC | PRN

## 2016-01-10 MED ORDER — PANTOPRAZOLE SODIUM 40 MG PO TBEC
40.0000 mg | DELAYED_RELEASE_TABLET | Freq: Every day | ORAL | Status: DC
  Administered 2016-01-11 – 2016-01-12 (×2): 40 mg via ORAL
  Filled 2016-01-10 (×2): qty 1

## 2016-01-10 MED ORDER — LEVOCETIRIZINE DIHYDROCHLORIDE 5 MG PO TABS: 5.0000 mg | ORAL_TABLET | Freq: Every evening | ORAL | Status: DC

## 2016-01-10 MED ORDER — ONDANSETRON HCL 4 MG/2ML IJ SOLN
INTRAMUSCULAR | Status: DC | PRN
  Administered 2016-01-10: 4 mg via INTRAVENOUS

## 2016-01-10 MED ORDER — LIDOCAINE-EPINEPHRINE 1 %-1:100000 IJ SOLN
INTRAMUSCULAR | Status: AC
  Filled 2016-01-10: qty 1

## 2016-01-10 MED ORDER — NEOSTIGMINE METHYLSULFATE 10 MG/10ML IV SOLN
INTRAVENOUS | Status: DC | PRN
  Administered 2016-01-10: 3 mg via INTRAVENOUS

## 2016-01-10 MED ORDER — ATROPINE SULFATE 0.4 MG/ML IJ SOLN
INTRAMUSCULAR | Status: DC | PRN
  Administered 2016-01-10 (×2): 0.2 mg via INTRAVENOUS

## 2016-01-10 MED ORDER — STERILE WATER FOR IRRIGATION IR SOLN
Status: DC | PRN
  Administered 2016-01-10: 400 mL

## 2016-01-10 MED ORDER — ONDANSETRON HCL 4 MG PO TABS: 4.0000 mg | ORAL_TABLET | Freq: Four times a day (QID) | ORAL | Status: DC | PRN

## 2016-01-10 MED ORDER — LIDOCAINE-EPINEPHRINE 1 %-1:100000 IJ SOLN
INTRAMUSCULAR | Status: DC | PRN
  Administered 2016-01-10: 3 mL
  Administered 2016-01-10: 5 mL

## 2016-01-10 MED ORDER — ROCURONIUM BROMIDE 100 MG/10ML IV SOLN
INTRAVENOUS | Status: DC | PRN
Start: 2016-01-10 — End: 2016-01-10
  Administered 2016-01-10: 10 mg via INTRAVENOUS
  Administered 2016-01-10: 20 mg via INTRAVENOUS
  Administered 2016-01-10: 10 mg via INTRAVENOUS
  Administered 2016-01-10: 20 mg via INTRAVENOUS

## 2016-01-10 MED ORDER — VITAMIN D3 25 MCG (1000 UNIT) PO TABS: 1000.0000 [IU] | ORAL_TABLET | ORAL | Status: DC

## 2016-01-10 SURGICAL SUPPLY — 84 items
APL SKNCLS STERI-STRIP NONHPOA (GAUZE/BANDAGES/DRESSINGS) ×3
BENZOIN TINCTURE PRP APPL 2/3 (GAUZE/BANDAGES/DRESSINGS) ×6 IMPLANT
BLADE SURG 11 STRL SS (BLADE) ×6 IMPLANT
CANISTER SUCT 3000ML (MISCELLANEOUS) ×6 IMPLANT
CATH FOLEY 2WAY SLVR  5CC 18FR (CATHETERS) ×2
CATH FOLEY 2WAY SLVR 5CC 18FR (CATHETERS) ×4 IMPLANT
CATH FOLEY 3WAY  5CC 16FR (CATHETERS) ×2
CATH FOLEY 3WAY 30CC 18FR (CATHETERS) ×6 IMPLANT
CATH FOLEY 3WAY 5CC 16FR (CATHETERS) ×4 IMPLANT
CELLS DAT CNTRL 66122 CELL SVR (MISCELLANEOUS) IMPLANT
CLOSURE WOUND 1/2 X4 (GAUZE/BANDAGES/DRESSINGS) ×1
CLOTH BEACON ORANGE TIMEOUT ST (SAFETY) ×6 IMPLANT
CONT PATH 16OZ SNAP LID 3702 (MISCELLANEOUS) IMPLANT
DECANTER SPIKE VIAL GLASS SM (MISCELLANEOUS) IMPLANT
DEVICE CAPIO SLIM SINGLE (INSTRUMENTS) IMPLANT
DISSECTOR SPONGE CHERRY (GAUZE/BANDAGES/DRESSINGS) ×6 IMPLANT
DRAPE UNDERBUTTOCKS STRL (DRAPE) ×6 IMPLANT
DRAPE WARM FLUID 44X44 (DRAPE) ×6 IMPLANT
DRSG OPSITE POSTOP 4X10 (GAUZE/BANDAGES/DRESSINGS) ×6 IMPLANT
DURAPREP 26ML APPLICATOR (WOUND CARE) ×6 IMPLANT
ELECT BLADE 6 FLAT ULTRCLN (ELECTRODE) IMPLANT
ELECT BLADE 6.5 EXT (BLADE) ×6 IMPLANT
FILTER STRAW FLUID ASPIR (MISCELLANEOUS) IMPLANT
GAUZE PACKING 2X5 YD STRL (GAUZE/BANDAGES/DRESSINGS) ×6 IMPLANT
GAUZE SPONGE 4X4 16PLY XRAY LF (GAUZE/BANDAGES/DRESSINGS) ×6 IMPLANT
GLOVE BIO SURGEON STRL SZ 6.5 (GLOVE) ×10 IMPLANT
GLOVE BIO SURGEONS STRL SZ 6.5 (GLOVE) ×2
GLOVE BIOGEL PI IND STRL 6.5 (GLOVE) ×4 IMPLANT
GLOVE BIOGEL PI IND STRL 7.0 (GLOVE) ×12 IMPLANT
GLOVE BIOGEL PI INDICATOR 6.5 (GLOVE) ×2
GLOVE BIOGEL PI INDICATOR 7.0 (GLOVE) ×6
GOWN STRL REUS W/TWL LRG LVL3 (GOWN DISPOSABLE) ×24 IMPLANT
GRAFT Y-MESH ALYTE (Mesh General) ×6 IMPLANT
LIQUID BAND (GAUZE/BANDAGES/DRESSINGS) ×6 IMPLANT
NEEDLE HYPO 22GX1.5 SAFETY (NEEDLE) ×6 IMPLANT
NEEDLE MAYO 6 CRC TAPER PT (NEEDLE) IMPLANT
NS IRRIG 1000ML POUR BTL (IV SOLUTION) ×12 IMPLANT
PACK ABDOMINAL GYN (CUSTOM PROCEDURE TRAY) ×6 IMPLANT
PACK VAGINAL WOMENS (CUSTOM PROCEDURE TRAY) ×6 IMPLANT
PAD MAGNETIC INST (MISCELLANEOUS) ×6 IMPLANT
PAD OB MATERNITY 4.3X12.25 (PERSONAL CARE ITEMS) ×6 IMPLANT
PENCIL BUTTON HOLSTER BLD 10FT (ELECTRODE) ×6 IMPLANT
PLUG CATH AND CAP STER (CATHETERS) ×6 IMPLANT
RETRACTOR STAY HOOK 5MM (MISCELLANEOUS) ×6 IMPLANT
RETRACTOR WND ALEXIS 25 LRG (MISCELLANEOUS) ×4 IMPLANT
RTRCTR WOUND ALEXIS 18CM MED (MISCELLANEOUS)
RTRCTR WOUND ALEXIS 25CM LRG (MISCELLANEOUS) ×6
SET CYSTO W/LG BORE CLAMP LF (SET/KITS/TRAYS/PACK) ×6 IMPLANT
SHEET LAVH (DRAPES) ×6 IMPLANT
SLING TVT EXACT (Sling) ×6 IMPLANT
SPONGE GAUZE 4X4 12PLY STER LF (GAUZE/BANDAGES/DRESSINGS) IMPLANT
SPONGE LAP 18X18 X RAY DECT (DISPOSABLE) IMPLANT
SPONGE LAP 4X18 X RAY DECT (DISPOSABLE) ×6 IMPLANT
SPONGE SURGIFOAM ABS GEL 12-7 (HEMOSTASIS) IMPLANT
STAPLER VISISTAT 35W (STAPLE) IMPLANT
STRIP CLOSURE SKIN 1/2X4 (GAUZE/BANDAGES/DRESSINGS) ×5 IMPLANT
SURGIFLO TRUKIT (HEMOSTASIS) IMPLANT
SURGIFLO W/THROMBIN 8M KIT (HEMOSTASIS) ×6 IMPLANT
SUT CAPIO ETHIBPND (SUTURE) IMPLANT
SUT ETHIBOND 0 (SUTURE) ×36 IMPLANT
SUT PDS AB 0 CT1 27 (SUTURE) IMPLANT
SUT PDS AB 2-0 CT1 27 (SUTURE) IMPLANT
SUT PLAIN 2 0 XLH (SUTURE) ×6 IMPLANT
SUT VIC AB 0 CT1 18XCR BRD8 (SUTURE) ×8 IMPLANT
SUT VIC AB 0 CT1 27 (SUTURE) ×18
SUT VIC AB 0 CT1 27XBRD ANBCTR (SUTURE) ×12 IMPLANT
SUT VIC AB 0 CT1 8-18 (SUTURE) ×12
SUT VIC AB 2-0 CT1 (SUTURE) IMPLANT
SUT VIC AB 2-0 CT1 27 (SUTURE) ×6
SUT VIC AB 2-0 CT1 TAPERPNT 27 (SUTURE) ×4 IMPLANT
SUT VIC AB 2-0 CT2 27 (SUTURE) IMPLANT
SUT VIC AB 2-0 SH 27 (SUTURE) ×12
SUT VIC AB 2-0 SH 27XBRD (SUTURE) ×8 IMPLANT
SUT VIC AB 2-0 UR6 27 (SUTURE) IMPLANT
SUT VIC AB 4-0 PS2 27 (SUTURE) ×6 IMPLANT
SUT VICRYL 0 TIES 12 18 (SUTURE) ×6 IMPLANT
SYR 30ML LL (SYRINGE) IMPLANT
SYR 50ML LL SCALE MARK (SYRINGE) ×6 IMPLANT
SYR CONTROL 10ML LL (SYRINGE) ×6 IMPLANT
TOWEL OR 17X24 6PK STRL BLUE (TOWEL DISPOSABLE) ×12 IMPLANT
TRAY FOLEY BAG SILVER LF 16FR (SET/KITS/TRAYS/PACK) IMPLANT
TUBING NON-CON 1/4 X 20 CONN (TUBING) IMPLANT
TUBING NON-CON 1/4 X 20' CONN (TUBING)
WATER STERILE IRR 1000ML POUR (IV SOLUTION) IMPLANT

## 2016-01-10 NOTE — Anesthesia Preprocedure Evaluation (Addendum)
Anesthesia Evaluation  Patient identified by MRN, date of birth, ID band Patient awake    Reviewed: Allergy & Precautions, NPO status , Patient's Chart, lab work & pertinent test results  Airway Mallampati: II  TM Distance: >3 FB Neck ROM: Full    Dental  (+) Poor Dentition   Pulmonary asthma , pneumonia, resolved, COPD,  COPD inhaler, former smoker,    Pulmonary exam normal breath sounds clear to auscultation + decreased breath sounds      Cardiovascular negative cardio ROS Normal cardiovascular exam Rhythm:Regular Rate:Normal     Neuro/Psych  Headaches, PSYCHIATRIC DISORDERS Anxiety Depression Bipolar Disorder  Neuromuscular disease    GI/Hepatic negative GI ROS, Neg liver ROS, GERD  Medicated and Controlled,  Endo/Other  Hypothyroidism   Renal/GU negative Renal ROS Bladder dysfunction  Genuine SUI    Musculoskeletal  (+) Arthritis , Osteoarthritis,  Fibromyalgia -  Abdominal   Peds  Hematology  (+) anemia ,   Anesthesia Other Findings   Reproductive/Obstetrics Uterovaginal prolapse Cystocele Rectocele                            Anesthesia Physical Anesthesia Plan  ASA: III  Anesthesia Plan: General   Post-op Pain Management:    Induction: Intravenous  Airway Management Planned: Oral ETT  Additional Equipment:   Intra-op Plan:   Post-operative Plan: Extubation in OR  Informed Consent: I have reviewed the patients History and Physical, chart, labs and discussed the procedure including the risks, benefits and alternatives for the proposed anesthesia with the patient or authorized representative who has indicated his/her understanding and acceptance.   Dental advisory given  Plan Discussed with: CRNA, Anesthesiologist and Surgeon  Anesthesia Plan Comments:         Anesthesia Quick Evaluation

## 2016-01-10 NOTE — Progress Notes (Signed)
Day of Surgery Procedure(s) (LRB): HYSTERECTOMY ABDOMINAL (N/A) BILATERAL SALPINGO OOPHORECTOMY (Bilateral) ABDOMINO SACROCOLPOPEXY  (N/A)  POSTERIOR REPAIR (RECTOCELE) (N/A) TRANSVAGINAL TAPE (TVT) PROCEDURE exact midurethral sling (N/A) CYSTOSCOPY (N/A)  Subjective: Patient reports incisional pain.  Using Dilaudid high dose PCA.  Thirsty.  Objective: I have reviewed patient's vital signs and intake and output. T max 98, T now 97.8, BP 94/51, P 69, RR 16. I/O - 2000 cc IV/ 650 cc UO   General: cooperative and sleepy.  Appropriate conversation.  Resp: clear to auscultation bilaterally Cardio: regular rate and rhythm, S1, S2 normal, no murmur, click, rub or gallop GI: incision: Dressing clean, dry, intact.  Bowel sounds hypoactive, soft, nontender. and nondistended. Extremities: PAS and Ted hose on.  DPs 2+ bilaterally.  Vaginal Bleeding: minimal  Assessment: s/p Procedure(s): HYSTERECTOMY ABDOMINAL (N/A) BILATERAL SALPINGO OOPHORECTOMY (Bilateral) ABDOMINO SACROCOLPOPEXY  (N/A)  POSTERIOR REPAIR (RECTOCELE) (N/A) TRANSVAGINAL TAPE (TVT) PROCEDURE exact midurethral sling (N/A) CYSTOSCOPY (N/A): stable  Plan: Continue foley due to post op state.  Dilaudid high dose PCA for pain control.  Clear liquids.  CBC and BMP in am.  Discussed surgical findings and procedure.   LOS: 0 days    Arloa Koh 01/10/2016, 4:35 PM

## 2016-01-10 NOTE — Anesthesia Procedure Notes (Signed)
Procedure Name: Intubation Date/Time: 01/10/2016 7:28 AM Performed by: Georgeanne Nim Pre-anesthesia Checklist: Patient identified, Emergency Drugs available, Suction available, Patient being monitored and Timeout performed Patient Re-evaluated:Patient Re-evaluated prior to inductionOxygen Delivery Method: Circle system utilized Preoxygenation: Pre-oxygenation with 100% oxygen Intubation Type: IV induction Ventilation: Mask ventilation without difficulty Laryngoscope Size: Mac and 3 Grade View: Grade I Tube type: Oral Tube size: 7.0 mm Number of attempts: 1 Airway Equipment and Method: Stylet Placement Confirmation: ETT inserted through vocal cords under direct vision,  positive ETCO2,  CO2 detector and breath sounds checked- equal and bilateral Secured at: 21 cm Tube secured with: Tape Dental Injury: Teeth and Oropharynx as per pre-operative assessment

## 2016-01-10 NOTE — OR Nursing (Signed)
Family updated by phone per Dr. Quincy Simmonds

## 2016-01-10 NOTE — OR Nursing (Signed)
Pt family updated by phone per Dr. Quincy Simmonds.

## 2016-01-10 NOTE — Progress Notes (Signed)
Update to History and Physical  Sinus infection resolved after abx.  Mammogram showing benign calcifications.  Patient examined.  OK to proceed with surgery.  Patient wishes to proceed.

## 2016-01-10 NOTE — Anesthesia Postprocedure Evaluation (Signed)
Anesthesia Post Note  Patient: Melody [name]  Procedure(s) Performed: Procedure(s) (LRB): HYSTERECTOMY ABDOMINAL (N/A) BILATERAL SALPINGO OOPHORECTOMY (Bilateral) ABDOMINO SACROCOLPOPEXY  (N/A)  POSTERIOR REPAIR (RECTOCELE) (N/A) TRANSVAGINAL TAPE (TVT) PROCEDURE exact midurethral sling (N/A) CYSTOSCOPY (N/A)  Patient location during evaluation: [facility] Anesthesia Type: General Level of consciousness: awake, awake and alert, oriented and patient cooperative Pain management: pain level controlled Vital Signs Assessment: post-procedure vital signs reviewed and stable Respiratory status: spontaneous breathing, nonlabored ventilation, respiratory function stable and patient connected to nasal cannula oxygen Cardiovascular status: stable Postop Assessment: no headache, no backache, patient able to bend at knees and no signs of nausea or vomiting Anesthetic complications: no    Last Vitals:  Filed Vitals:   01/10/16 1354 01/10/16 1547  BP: 100/49 94/51  Pulse: 70 69  Temp: 36.7 C 36.6 C  Resp: 16 16    Last Pain:  Filed Vitals:   01/10/16 1551  PainSc: 9                  Suzetta Timko L

## 2016-01-10 NOTE — Op Note (Signed)
OPERATIVE REPORT   PREOPERATIVE DIAGNOSIS: Complete uterovaginal prolapse, genuine stress incontinence.  POSTOPERATIVE DIAGNOSIS: Complete uterovaginal prolapse, genuine stress incontinence.  PROCEDURE: Total abdominal hysterectomy with bilateral  salpingo-oophorectomy, abdominal sacral colpopexy,  TVT Exact  midurethral sling and cystoscopy, posterior colporrhaphy.  SURGEON: Lenard Galloway, MD  ASSISTANT:  Dorothy Spark, MD  ANESTHESIA: General endotracheal.  IV FLUIDS:   Ringer's lactate.  EBL: 250  mL.  URINE OUTPUT: 400  mL.  COMPLICATIONS: None.  INDICATIONS FOR THE PROCEDURE: The patient is a  female,  who presents with progressive uterovaginal prolapse and a request  for surgical care. On physical exam, the patient is noted to have a  third degree cystocele, second degree uterine prolapse, and a first  degree high rectocele.  The patient has been experiencing any urinary stress incontinence.  She did undergo multichannel urodynamic testing in the office with  reduction of the prolapse and she demonstrated detrusor instability. The plan is now made to proceed with a total abdominal hysterectomy with a bilateral salpingo-oophorectomy, abdominal sacral colpopexy, potential Halban's culdoplasty, TVT Exact mid urethral sling, and possible anterior and posterior colporrhaphy. Risks, benefits, and alternatives have been reviewed with the patient who wishes to proceed.  FINDINGS: Examination under anesthesia revealed a third-degree cystocele and second degree uterine prolapse and a first-degree high  rectocele.  At the time of laparotomy, the patient was noted to have a normal uterus.  The bilateral tubes were consistent with tubal ligation. The  left ovary was normal and the right ovary contained a 3 cm cyst. The  surface of the ovary was slightly irregular.  There was adhesive disease  with the omentum adherent to the anterior abdominal  wall just below the  umbilicus.  The distal left fallopian tube was adherent the the left pelvic sidewall.  The appendix was unremarkable. The liver edge and gallbladder  palpated to be normal. The kidneys were unremarkable as was the  periaortic region.  All peritoneal surfaces were smooth, and there was no evidence of ascites.  Cystoscopy at termination of the bladder portion of the procedure documented the bladder to be intact throughout 360 degrees including the bladder dome and trigone. There was no evidence of a foreign body in the bladder or the urethra. The ureters were patent bilaterally.  SPECIMENS: The uterus, cervix, bilateral tubes and bilateral ovaries were sent to Pathology.  PROCEDURE IN DETAIL: The patient was reidentified in the preop hold area. She did receive cefotetan 2 g IV for antibiotic prophylaxis. She received both TED hose and PAS stockings for DVT prophylaxis.  The patient was escorted to the operating room, where she was placed in the dorsal lithotomy position in [place]. General endotracheal anesthesia was induced. The patient's lower abdomen, vagina, and perineum were sterilely prepped and she was sterilely draped. The Foley catheter was placed inside the bladder and left to gravity drainage throughout and at the termination of the procedure.  The procedure began by creating a Pfannenstiel incision with a scalpel. The incision was carried down to the subcutaneous tissues using monopolar cautery for hemostasis. The fascia was incised in the midline with a scalpel and the incision was extended bilaterally using the Mayo scissors. The rectus muscles were bluntly divided in the midline. The parietal peritoneum was elevated with 2 hemostat clamps and entered sharply with the Metzenbaum scissors. The peritoneal incision was extended cranially and caudally.  An Alexis retractor was then placed inside the peritoneal cavity and  the patient was placed  in the Trendelenburg position. The bowel was packed into the upper abdomen with moistened lap pads.  The procedure began by placing Kelly clamps across the adnexal structures bilaterally. The left round ligament was isolated and was suture ligated with a transfixing suture of 0 Vicryl. Monopolar cautery was used to bisect the round ligament and the anterior and posterior leaves were opened with monopolar cautery. The bladder flap was taken down anteriorly using a Metzenbaum scissors. The ureter was identified along the patient's left pelvic sidewall. The distal left fallopian tube was dissected away from the left pelvic side wall.  The window was created through the posterior leaf of the broad ligament on this side and the infundibulopelvic ligament was doubly clamped, sharply divided, and free tied with 0 Vicryl followed by suture ligature of the same. The same procedure that was performed on the patient's left hand side was now repeated on the patient's right hand side for isolation of the round ligament, dissection in the broad ligament, and the isolation of the infundibulopelvic ligament. Again, the ureter was identified prior to clamping the infundibulopelvic ligament.  The bladder flap was further taken down sharply with the Metzenbaum scissors. Each of the uterine arteries were skeletonized and they were then clamped, sharply divided, and suture ligated with 0 Vicryl suture. The bladder was further dissected off the cervix anteriorly and the inferior aspects of the cardinal ligaments were clamped with straight Heaney clamps, sharply divided, and suture ligated with 0 Vicryl bilaterally. Eventually, the uterosacral ligaments were reached. A clamp was placed across the left vaginal apex and the vagina was entered in this location. The pedicle was suture ligated with a transfixing suture of 0 Vicryl. The same was performed on the  patient's right hand side. The Mayo scissors was used to sharply excise the cervix and the specimen from the vaginal apex. The specimen was sent to Pathology.  The vaginal cuff was closed at this time with a figure-of-eight suture of 0 Vicryl.  The pelvis was irrigated and suctioned and hemostasis was noted to be good.  The abdominal sacrocolpopexy was performed next. An EEA Sizer was placed in the vagina at this time and the peritoneum along the anterior and posterior vaginal walls was opened and dissection performed down to the level of the underside of the vagina, both anteriorly and posteriorly.  Attention at this time was turned to the sacral promontory. The peritoneum overlying the sacral promontory was identified along with the landmarks of the bifurcation of the aorta and iliac vessels and the right ureter. The peritoneum was opened using a combination of a Metzenbaum scissors and monopolar cautery. Careful dissection was performed down to the level of the sacral promontory. The anterior longitudinal ligament was identified along with the middle sacral vessels. Hemostasis was good. The peritoneum was then opened inferiorly along the patient's right pelvic sidewall and all the way down to the level of dissection along the posterior vaginal peritoneal incision.  At this time, the Alyte mesh was trimmed to properly fit the patient's anatomy and the mesh was secured to the anterior vagina using a series of several 0 Ethibond sutures. The same was performed along the posterior vaginal wall.  The EEA Sizer was removed and a hand was placed inside the vagina and the elevation of the vaginal cuff was simulated. The point of attachment of the Alyte mesh was then mapped to the sacral promontory and 2 sutures of 0 Ethibond were brought through the anterior longitudinal ligament to the  right and to the left of the middle sacral vessels. These sutures were then  brought through the mesh. The sutures were tied. Excess mesh was trimmed at this time. The mesh was re- peritonealized by closing the retroperitoneal space using 2-0 Vicryl suture. 2-0 Vicryl suture was also used to close the peritoneum over the vaginal cuff extending from right round ligament to the left round ligament. This allowed 100% coverage of the mesh material.  The pelvis was once again irrigated and suctioned.Hemostasis was then good. The abdomen was closed at this time. The moistened lap pads and the Alexis retractor were removed from the peritoneal cavity. The parietal peritoneum was closed with a running suture of 2-0 Vicryl. The fascia was  closed with a running suture of 0 Vicryl. The subcutaneous layer was irrigated  and suctioned and made hemostatic with monopolar cautery. Interrupted sutures of 2-0 plain were placed in the subcutaneous layer and the skin was closed with a subcuticular suture of 4-0 Vicryl. Steri-Strips and benzoin were placed over the incision. A sterile bandage was later placed.  Attention was turned to the vaginal portion of the procedure at this point. The patient was examined and there was excellent vaginal cuff support. There was no evidence of a cystocele at this time. There was a first-degree high rectocele noted.  Allis clamps were used to mark a 4 cm area underneath the beginning 1 cm below the urethral meatus and extending down toward the vaginal cuff. The mucosa was injected locally with 1% lidocaine with epinephrine 1:100,000. The mucosa was incised vertically in the midline with a scalpel and a combination of sharp and blunt dissection were used to dissect this vaginal tissue and mucosa off the underlying bladder. The dissection was carried back to the pubic rami.  A 1 cm suprapubic incisions were then created 2 cm to the right and left in the midline. The Foley catheter was removed. The TVT Exact was performed in  a bottom up fashion. The urethral guide and Foley tip were placed in the urethra and the urethra was deflected properly as the TVT guide was brought through the right retropubic space and up through the right suprapubic incision. The urethra was then deflected in the opposite direction and the TVT guide was placed through the left retropubic space and through the left suprapubic ipsilateral incision.  The obturator guide was removed at this time and cystoscopy was performed and the findings were as noted above.  Patency of the left ureter was demonstrated.  Please note that cystoscopy was repeated after closure  of the anterior vaginal wall, and patency of the right ureter was noted then.  The bladder was drained of all cystoscopic fluid and the Foley catheter was replaced. The sling was brought up through the suprapubic incisions bilaterally. The plastic sheaths were removed as a Kelly clamp was placed between the urethra and the sling. The sling was noted to be in excellent position. Excess mesh was trimmed suprapubically. The vaginal skin edges were trimmed at this time and the anterior vaginal wall was closed with a running lock suture of 2-0 Vicryl.  The suprapubic incisions were closed at the termination of the procedure with Dermabond.  Attention was turned to the posterior vaginal wall at this time. Allis clamps were used to mark the introitus, the perineal body, and then the posterior vaginal wall mucosa. The perineal body and the posterior vaginal mucosa were injected with 1% lidocaine with epinephrine 1:100,000. A triangular wedge of epithelium was  excised from the perineal body and the posterior vaginal mucosa was incised vertically in the midline with the Metzenbaum scissors. Sharp and blunt dissection were used to dissect the perirectal fascia off the vaginal mucosa bilaterally. The posterior colporrhaphy was then performed with vertical mattress sutures  of 0 Vicryl which reduced the rectocele. A crown suture of 0 Vicryl were placed along the perineal body. Rectal exam confirmed the absence of sutures in the rectum. Excess vaginal mucosa was trimmed and the posterior vaginal mucosa was closed with a running lock suture of 2-0 Vicryl. This was brought down to the hymenal ring and then under the hymen. The transverse perineal muscles were reapproximated with a running suture of this 2-0 Vicryl. The suture was then brought up the perineal body in a subcuticular fashion as for an episiotomy repair. The knot was tied at the hymen.  Vaginal examination confirmed excellent vaginal support and elevation.  A gauze packing with Estrace cream was placed inside the vagina.  At this point, the patient was awakened and extubated, and escorted to the recovery room in stable condition. There were no complications. All needle, instrument, and sponge counts were correct.     Lenard Galloway, M.D.

## 2016-01-10 NOTE — Transfer of Care (Signed)
Immediate Anesthesia Transfer of Care Note  Patient: Melody [name]  Procedure(s) Performed: Procedure(s): HYSTERECTOMY ABDOMINAL (N/A) BILATERAL SALPINGO OOPHORECTOMY (Bilateral) ABDOMINO SACROCOLPOPEXY  (N/A)  POSTERIOR REPAIR (RECTOCELE) (N/A) TRANSVAGINAL TAPE (TVT) PROCEDURE exact midurethral sling (N/A) CYSTOSCOPY (N/A)  Patient Location: PACU  Anesthesia Type:General  Level of Consciousness: awake and alert   Airway & Oxygen Therapy: Patient Spontanous Breathing and Patient connected to nasal cannula oxygen  Post-op Assessment: Report given to RN and Post -op Vital signs reviewed and stable  Post vital signs: Reviewed and stable  Last Vitals:  Filed Vitals:   01/10/16 0613  BP: 111/67  Pulse: 65  Temp: 36.5 C  Resp: 20    Complications: No apparent anesthesia complications

## 2016-01-10 NOTE — Addendum Note (Signed)
Addendum  created 01/10/16 1644 by Raenette Rover, CRNA   Modules edited: Clinical Notes   Clinical Notes:  File: [id number]

## 2016-01-10 NOTE — Anesthesia Postprocedure Evaluation (Signed)
Anesthesia Post Note  Patient: Melody [name]  Procedure(s) Performed: Procedure(s) (LRB): HYSTERECTOMY ABDOMINAL (N/A) BILATERAL SALPINGO OOPHORECTOMY (Bilateral) ABDOMINO SACROCOLPOPEXY  (N/A)  POSTERIOR REPAIR (RECTOCELE) (N/A) TRANSVAGINAL TAPE (TVT) PROCEDURE exact midurethral sling (N/A) CYSTOSCOPY (N/A)  Patient location during evaluation: PACU Anesthesia Type: General Level of consciousness: awake and alert and oriented Pain management: pain level controlled Vital Signs Assessment: post-procedure vital signs reviewed and stable Respiratory status: spontaneous breathing, nonlabored ventilation and respiratory function stable Cardiovascular status: blood pressure returned to baseline and stable Postop Assessment: no signs of nausea or vomiting Anesthetic complications: no    Last Vitals:  Filed Vitals:   01/10/16 1230 01/10/16 1245  BP: 106/57 105/60  Pulse: 75   Temp:    Resp: 11     Last Pain:  Filed Vitals:   01/10/16 1256  PainSc: 9                  Molly Maselli A.

## 2016-01-10 NOTE — Brief Op Note (Signed)
01/10/2016  12:01 PM  PATIENT:  Melody [name]  60 y.o. female  PRE-OPERATIVE DIAGNOSIS:  uterovaginal prolapse, genuine SUI  POST-OPERATIVE DIAGNOSIS:  uterovaginal prolapse, genuine SUI  PROCEDURE:  Procedure(s): HYSTERECTOMY ABDOMINAL (N/A) BILATERAL SALPINGO OOPHORECTOMY (Bilateral) ABDOMINO SACROCOLPOPEXY  (N/A)  POSTERIOR REPAIR (RECTOCELE) (N/A) TRANSVAGINAL TAPE (TVT) PROCEDURE exact midurethral sling (N/A) CYSTOSCOPY (N/A)  Lysis of abdominal adhesions.   SURGEON:  Surgeon(s) and Role:    * Brook E Yisroel Ramming, MD - Primary    * Salvadore Dom, MD - Assisting  PHYSICIAN ASSISTANT:    NA  ASSISTANTS: Salvadore Dom, MD - Assisting   ANESTHESIA:   local and general  EBL:  Total I/O In: 2000 [I.V.:2000] Out: 650 [Urine:400; Blood:250]  BLOOD ADMINISTERED:none  DRAINS: Urinary Catheter (Foley)   LOCAL MEDICATIONS USED:  LIDOCAINE   SPECIMEN:  Source of Specimen:  Uterus, cervix, bilateral tubes and ovaries.   DISPOSITION OF SPECIMEN:  PATHOLOGY  COUNTS:  YES  TOURNIQUET:  * No tourniquets in log *  DICTATION: .Other Dictation: Dictation Number    PLAN OF CARE: Admit to inpatient   PATIENT DISPOSITION:  PACU - hemodynamically stable.   Delay start of Pharmacological VTE agent (>24hrs) due to surgical blood loss or risk of bleeding: not applicable

## 2016-01-11 ENCOUNTER — Encounter (HOSPITAL_COMMUNITY): Payer: Self-pay | Admitting: Obstetrics and Gynecology

## 2016-01-11 LAB — CBC
HCT: 32.1 % — ABNORMAL LOW (ref 36.0–46.0)
HEMATOCRIT: 31.8 % — AB (ref 36.0–46.0)
Hemoglobin: 10.8 g/dL — ABNORMAL LOW (ref 12.0–15.0)
Hemoglobin: 10.6 g/dL — ABNORMAL LOW (ref 12.0–15.0)
MCH: 29.3 pg (ref 26.0–34.0)
MCH: 29.3 pg (ref 26.0–34.0)
MCHC: 33.6 g/dL (ref 30.0–36.0)
MCHC: 33.3 g/dL (ref 30.0–36.0)
MCV: 87 fL (ref 78.0–100.0)
MCV: 87.8 fL (ref 78.0–100.0)
PLATELETS: 125 10*3/uL — AB (ref 150–400)
Platelets: 121 10*3/uL — ABNORMAL LOW (ref 150–400)
RBC: 3.69 MIL/uL — AB (ref 3.87–5.11)
RBC: 3.62 MIL/uL — ABNORMAL LOW (ref 3.87–5.11)
RDW: 13.5 % (ref 11.5–15.5)
RDW: 13.6 % (ref 11.5–15.5)
WBC: 10.9 10*3/uL — ABNORMAL HIGH (ref 4.0–10.5)
WBC: 9.3 10*3/uL (ref 4.0–10.5)

## 2016-01-11 LAB — BASIC METABOLIC PANEL
Anion gap: 4 — ABNORMAL LOW (ref 5–15)
BUN: 9 mg/dL (ref 6–20)
CHLORIDE: 106 mmol/L (ref 101–111)
CO2: 29 mmol/L (ref 22–32)
Calcium: 8.4 mg/dL — ABNORMAL LOW (ref 8.9–10.3)
Creatinine, Ser: 0.7 mg/dL (ref 0.44–1.00)
GFR calc Af Amer: >60 mL/min (ref >60)
GLUCOSE: 139 mg/dL — AB (ref 65–99)
POTASSIUM: 3.9 mmol/L (ref 3.5–5.1)
Sodium: 139 mmol/L (ref 135–145)

## 2016-01-11 MED ORDER — HYDROMORPHONE HCL 2 MG PO TABS: 2.0000 mg | ORAL_TABLET | ORAL | Status: DC | PRN

## 2016-01-11 MED ORDER — OXYCODONE-ACETAMINOPHEN 5-325 MG PO TABS
1.0000 | ORAL_TABLET | ORAL | Status: DC | PRN
  Administered 2016-01-11 – 2016-01-12 (×3): 1 via ORAL
  Filled 2016-01-11 (×4): qty 1

## 2016-01-11 MED ORDER — AMPHETAMINE-DEXTROAMPHETAMINE 20 MG PO TABS: 30.0000 mg | ORAL_TABLET | Freq: Every day | ORAL | Status: DC

## 2016-01-11 MED ORDER — AMPHETAMINE-DEXTROAMPHETAMINE 20 MG PO TABS
30.0000 mg | ORAL_TABLET | Freq: Every day | ORAL | Status: DC
  Administered 2016-01-12: 30 mg via ORAL

## 2016-01-11 NOTE — Progress Notes (Signed)
Pt is walking around asymptomatic despite low blood pressure reading. [name] heart rate is WDL. MD called. Message left as well as call back number.

## 2016-01-11 NOTE — Progress Notes (Signed)
Pt had a family member bring her adderall medication from home. Pt took medication without RN being aware. Medication taken to pharmacy. Pt instructed not to take home medication for the staff to be able to deliver safe care.

## 2016-01-11 NOTE — Progress Notes (Signed)
1 Day Post-Op Procedure(s) (LRB): HYSTERECTOMY ABDOMINAL (N/A) BILATERAL SALPINGO OOPHORECTOMY (Bilateral) ABDOMINO SACROCOLPOPEXY  (N/A)  POSTERIOR REPAIR (RECTOCELE) (N/A) TRANSVAGINAL TAPE (TVT) PROCEDURE exact midurethral sling (N/A) CYSTOSCOPY (N/A)  Subjective: Patient reports incisional pain.   Hungry and wants to eat.  Ambulating.  Had a panic attack last hs, and was started back on her Xanax prn.  Objective: I have reviewed patient's vital signs, intake and output and labs. T max 99.3, T now 99, BP 94/51, P 92, RR 15 I/O - 2000 cc IV/ 2950 cc UO WBC 10.9, Hgb 10.8, platelets 125,000.  Glucose 139.   General: alert Resp: clear to auscultation bilaterally Cardio: regular rate and rhythm, S1, S2 normal, no murmur, click, rub or gallop GI: soft, non-tender; bowel sounds normal; no masses,  no organomegaly and incision: clean, dry and intact Extremities: Ted hose and PAS hose on. Vaginal Bleeding: minimal  Assessment: s/p Procedure(s): HYSTERECTOMY ABDOMINAL (N/A) BILATERAL SALPINGO OOPHORECTOMY (Bilateral) ABDOMINO SACROCOLPOPEXY  (N/A)  POSTERIOR REPAIR (RECTOCELE) (N/A) TRANSVAGINAL TAPE (TVT) PROCEDURE exact midurethral sling (N/A) CYSTOSCOPY (N/A): progressing well  Plan: Advance diet Encourage ambulation Advance to PO medication Surgical findings and procedure reviewed.  CBC and BMP in am.    LOS: 1 day    Brook Matthew Saras 01/11/2016, 7:53 AM

## 2016-01-12 LAB — CBC
HCT: 32.3 % — ABNORMAL LOW (ref 36.0–46.0)
Hemoglobin: 10.6 g/dL — ABNORMAL LOW (ref 12.0–15.0)
MCH: 29 pg (ref 26.0–34.0)
MCHC: 32.8 g/dL (ref 30.0–36.0)
MCV: 88.5 fL (ref 78.0–100.0)
PLATELETS: 123 10*3/uL — AB (ref 150–400)
RBC: 3.65 MIL/uL — AB (ref 3.87–5.11)
RDW: 13.7 % (ref 11.5–15.5)
WBC: 7.7 10*3/uL (ref 4.0–10.5)

## 2016-01-12 LAB — BASIC METABOLIC PANEL
ANION GAP: 3 — AB (ref 5–15)
BUN: 8 mg/dL (ref 6–20)
CALCIUM: 8.2 mg/dL — AB (ref 8.9–10.3)
CO2: 29 mmol/L (ref 22–32)
Chloride: 107 mmol/L (ref 101–111)
Creatinine, Ser: 0.7 mg/dL (ref 0.44–1.00)
Glucose, Bld: 95 mg/dL (ref 65–99)
POTASSIUM: 3.8 mmol/L (ref 3.5–5.1)
SODIUM: 139 mmol/L (ref 135–145)

## 2016-01-12 MED ORDER — OXYCODONE-ACETAMINOPHEN 5-325 MG PO TABS
1.0000 | ORAL_TABLET | ORAL | Status: DC | PRN
Start: 2016-01-12 — End: 2016-02-08

## 2016-01-12 NOTE — Care Management Important Message (Signed)
Important Message  Patient Details  Name: Melody [name] MRN: [id number] Date of Birth: 01/08/1956   Medicare Important Message Given:  Yes    [name] 01/12/2016, 9:16 AMImportant Message  Patient Details  Name: Melody [name] MRN: [id number] Date of Birth: [birth year]   Medicare Important Message Given:  Yes    [name] 01/12/2016, 9:15 AM

## 2016-01-12 NOTE — Progress Notes (Signed)
2 Days Post-Op Procedure(s) (LRB): HYSTERECTOMY ABDOMINAL (N/A) BILATERAL SALPINGO OOPHORECTOMY (Bilateral) ABDOMINO SACROCOLPOPEXY  (N/A)  POSTERIOR REPAIR (RECTOCELE) (N/A) TRANSVAGINAL TAPE (TVT) PROCEDURE exact midurethral sling (N/A) CYSTOSCOPY (N/A)  Subjective: Patient reports tolerating PO, + flatus and no problems voiding.   Has not had U/S to check PVR.   Objective: I have reviewed patient's vital signs, intake and output, labs and pathology. T max 99.1, T now 98.6, BP 105/50, P 75, RR 16.  I/O - 1080/2200 cc.  UO 150 cc.  Hgb last hs 10.6, Hgb now 10.6 Platelets last hs 121,000, Platelets now 123,000. Pathology - benign including benign right serous cyst of the ovary.   General: alert and cooperative Resp: clear to auscultation bilaterally Cardio: regular rate and rhythm, S1, S2 normal, no murmur, click, rub or gallop GI: soft, non-tender; bowel sounds normal; no masses,  no organomegaly and incision: Pfannenstiel incision clean, dry, intact with steristrips.  Left SP incision with separation.  Steristrips and benzoin placed.  Some SP ecchymoses.  No induration.  Vaginal Bleeding: minimal  Assessment: s/p Procedure(s): HYSTERECTOMY ABDOMINAL (N/A) BILATERAL SALPINGO OOPHORECTOMY (Bilateral) ABDOMINO SACROCOLPOPEXY  (N/A)  POSTERIOR REPAIR (RECTOCELE) (N/A) TRANSVAGINAL TAPE (TVT) PROCEDURE exact midurethral sling (N/A) CYSTOSCOPY (N/A): progressing well and ready for discharge.  Plan: Discharge home Complete voiding trial with ultrasound now and I and O cath if needed.  Instructions and precautions given.  Rx for Percocet.  Follow up next week.   LOS: 2 days    Arloa Koh 01/12/2016, 7:44 AM

## 2016-01-12 NOTE — Progress Notes (Signed)
Pt out in wheelchair  Teaching complete   

## 2016-01-12 NOTE — Discharge Instructions (Signed)
Abdominal Hysterectomy, Care After °Refer to this sheet in the next few weeks. These instructions provide you with information on caring for yourself after your procedure. Your health care provider may also give you more specific instructions. Your treatment has been planned according to current medical practices, but problems sometimes occur. Call your health care provider if you have any problems or questions after your procedure.  °WHAT TO EXPECT AFTER THE PROCEDURE °After your procedure, it is typical to have the following: °· Pain. °· Feeling tired. °· Poor appetite. °· Less interest in sex. °It takes 4-6 weeks to recover from this surgery.  °HOME CARE INSTRUCTIONS  °· Take pain medicines only as directed by your health care provider. Do not take over-the-counter pain medicines without checking with your health care provider first.  °· Change your bandage as directed by your health care provider. °· Return to your health care provider to have your sutures taken out. °· Take showers instead of baths for 2-3 weeks. Ask your health care provider when it is safe to start showering.  °· Do not douche, use tampons, or have sexual intercourse for at least 6 weeks or until your health care provider says you can.   °· Follow your health care provider's advice about exercise, lifting, driving, and general activities. °· Get plenty of rest and sleep.   °· Do not lift anything heavier than a gallon of milk (about 10 lb [4.5 kg]) for the first month after surgery. °· You can resume your normal diet if your health care provider says it is okay.   °· Do not drink alcohol until your health care provider says you can.   °· If you are constipated, ask your health care provider if you can take a mild laxative. °· Eating foods high in fiber may also help with constipation. Eat plenty of raw fruits and vegetables, whole grains, and beans. °· Drink enough fluids to keep your urine clear or pale yellow.   °· Try to have someone at  home with you for the first 1-2 weeks to help around the house. °· Keep all follow-up appointments. °SEEK MEDICAL CARE IF:  °· You have chills or fever. °· You have swelling, redness, or pain in the area of your incision that is getting worse.   °· You have pus coming from the incision.   °· You notice a bad smell coming from the incision or bandage.   °· Your incision breaks open.   °· You feel dizzy or Heidelberger-headed.   °· You have pain or bleeding when you urinate.   °· You have persistent diarrhea.   °· You have persistent nausea and vomiting.   °· You have abnormal vaginal discharge.   °· You have a rash.   °· You have any type of abnormal reaction or develop an allergy to your medicine.   °· Your pain medicine is not helping.   °SEEK IMMEDIATE MEDICAL CARE IF:  °· You have a fever and your symptoms suddenly get worse. °· You have severe abdominal pain. °· You have chest pain. °· You have shortness of breath. °· You faint. °· You have pain, swelling, or redness of your leg. °· You have heavy vaginal bleeding with blood clots. °MAKE SURE YOU: °· Understand these instructions. °· Will watch your condition. °· Will get help right away if you are not doing well or get worse. °  °This information is not intended to replace advice given to you by your health care provider. Make sure you discuss any questions you have with your health care provider. °  °Document   Released: 05/04/2005 Document Revised: 11/05/2014 Document Reviewed: 08/07/2013 Elsevier Interactive Patient Education 2016 Elsevier Inc[employer]  Urethral Vaginal Sling, Care After Refer to this sheet in the next few weeks. These instructions provide you with information on caring for yourself after your procedure. Your health care provider may also give you more specific instructions. Your treatment has been planned according to current medical practices, but problems sometimes occur. Call your health care provider if you have any problems or questions after your  procedure.  WHAT TO EXPECT AFTER THE PROCEDURE  After your procedure, it is typical to have the following:  A catheter in your bladder until your bladder is able to work on its own properly. You will be instructed on how to empty the catheter bag.  Absorbable stitches in your incisions. They will slowly dissolve over 1-2 months. HOME CARE INSTRUCTIONS  Get plenty of rest.  Only take over-the-counter or prescription medicines as directed by your health care provider. Do not take aspirin because it can cause bleeding.  Do not take baths. Take showers until your health care provider tells you otherwise.  You may resume your usual diet. Eat a well-balanced diet.  Drink enough fluids to keep your urine clear or pale yellow.  Limit exercise and activities as directed by your health care provider. Do not lift anything heavier than 5 pounds (2.3 kg).  Do not douche, use tampons, or have sexual intercourse for 6 weeks after your procedure.  Follow up with your health care provider as directed. SEEK MEDICAL CARE IF:  You have a heavy or bad smelling vaginal discharge.   You have a rash.   You have pain that is not controlled with medicines.   You have lightheadedness or feel faint.  SEEK IMMEDIATE MEDICAL CARE IF:  You have a fever.   You have vaginal bleeding.   You faint.   You have shortness of breath.   You have chest, abdominal, or leg pain.   You have pain when urinating or cannot urinate.   Your catheter is still in your bladder and becomes blocked.   You have swelling, redness, and pain in the vaginal area.    This information is not intended to replace advice given to you by your health care provider. Make sure you discuss any questions you have with your health care provider.   Document Released: 08/05/2013 Document Reviewed: 08/05/2013 Elsevier Interactive Patient Education 2016 Elsevier Inc.  Anterior and Posterior Colporrhaphy, Sling Procedure,  Care After Refer to this sheet in the next few weeks. These instructions provide you with information on caring for yourself after your procedure. Your health care provider may also give you more specific instructions. Your treatment has been planned according to current medical practices, but problems sometimes occur. Call your health care provider if you have any problems or questions after your procedure.  HOME CARE INSTRUCTIONS  Rest as much as possible during the first 2 weeks after the procedure.   Avoid heavy lifting (more than 10 pounds [4.5 kg]), pushing, or pulling. Limit stair climbing to once or twice a day the first week, then slowly increase this activity.   Avoid standing for prolonged periods of time.   Talk with your health care provider about when you may resume your usual physical activity.   You may resume your normal diet right away.   Drink at least 6-8 glasses of non-caffeinated beverages per day.   Eat a well-balanced diet. Daily portions of food from the meat (protein),  milk, fruit, vegetable, and bread families are necessary for your health.   Your normal bowel function should return. If you become constipated, you may:   Take a mild laxative.  Add fruit and bran to your diet.  Drink more liquids.  You may take a shower and wash your hair.   Only take over-the-counter or prescription medicines as directed by your health care provider.   Clean the incision with water. Do not use a dressing unless the incision is draining or irritated. Check your incision daily for redness, draining, swelling, or separation of the skin.   Follow any bladder care instructions provided by your health care provider.   Keep your perineal area (the area between vagina and rectum) clean and dry. Perform perineal care after every bowel movement and each time you urinate. You may take a sitz bath or sit in a tub of clean, warm water when necessary, unless your health care  provider tells you otherwise.   Do not have sexual intercourse until permitted by your health care provider.   Follow up with your health care provider as directed.  SEEK MEDICAL CARE IF:  You have shaking chills.   Your pain is not relieved with medicine or becomes worse.  You have frequent or urgent urination, or you are unable to completely empty your bladder.   You feel a burning sensation when urinating.   You see pus coming from the wounds.  SEEK IMMEDIATE MEDICAL CARE IF:  You develop a fever.  You notice redness, drainage, swelling, or separation of the skin at the incision site.  You have difficulty breathing.  You are unable to urinate. MAKE SURE YOU:   Understand these instructions.  Will watch your condition.  Will get help right away if you are not doing well or get worse.   This information is not intended to replace advice given to you by your health care provider. Make sure you discuss any questions you have with your health care provider.   Document Released: 05/29/2004 Document Revised: 06/17/2013 Document Reviewed: 03/06/2013 Elsevier Interactive Patient Education [employer].

## 2016-01-16 ENCOUNTER — Ambulatory Visit (INDEPENDENT_AMBULATORY_CARE_PROVIDER_SITE_OTHER): Payer: Medicare Other | Admitting: Obstetrics and Gynecology

## 2016-01-16 ENCOUNTER — Encounter: Payer: Self-pay | Admitting: Obstetrics and Gynecology

## 2016-01-16 VITALS — BP 100/60 | HR 88 | Ht 64.0 in | Wt 139.4 lb

## 2016-01-16 DIAGNOSIS — R42 Dizziness and giddiness: Secondary | ICD-10-CM | POA: Diagnosis not present

## 2016-01-16 DIAGNOSIS — D649 Anemia, unspecified: Secondary | ICD-10-CM

## 2016-01-16 DIAGNOSIS — Z9889 Other specified postprocedural states: Secondary | ICD-10-CM

## 2016-01-16 LAB — CBC
HCT: 40.4 % (ref 36.0–46.0)
HEMOGLOBIN: 13.6 g/dL (ref 12.0–15.0)
MCH: 28.9 pg (ref 26.0–34.0)
MCHC: 33.7 g/dL (ref 30.0–36.0)
MCV: 85.8 fL (ref 78.0–100.0)
MPV: 10.1 fL (ref 8.6–12.4)
Platelets: 238 10*3/uL (ref 150–400)
RBC: 4.71 MIL/uL (ref 3.87–5.11)
RDW: 13.4 % (ref 11.5–15.5)
WBC: 7.2 10*3/uL (ref 4.0–10.5)

## 2016-01-16 NOTE — Progress Notes (Signed)
Patient ID: Melody [name], female   DOB: 01-05-1956, 60 y.o.   MRN: [id number] GYNECOLOGY  VISIT   HPI: 60 y.o.   Legally Separated  Caucasian  female   [phone] with Patient's last menstrual period was 10/29/2006 (approximate).   here for 1 week follow up HYSTERECTOMY ABDOMINAL (N/A Abdomen)  BILATERAL SALPINGO OOPHORECTOMY (Bilateral Abdomen) [XVQ00 CPT (R)]  ABDOMINO SACROCOLPOPEXY (N/A Abdomen)  POSTERIOR REPAIR (RECTOCELE) (N/A Vagina )  TRANSVAGINAL TAPE (TVT) PROCEDURE exact midurethral sling (N/A Vagina )  CYSTOSCOPY (N/A Urethra).  Patient complains of constipation and also dizziness with standing and moving, but not all the time.  States she is not eating her usual food choices. Had 2 bowel movements.  Voiding well.   Having some vaginal spotting.  Pad changes twice a day, not saturated.   Taking Percocet 1/2 tablet twice a day.   Hgb today 12.1.  GYNECOLOGIC HISTORY: Patient's last menstrual period was 10/29/2006 (approximate). ContraceptionTubal/:Postmenopausal/Hysterectomy Menopausal hormone therapy: n/a Last mammogram: 01-05-16 Density Cat.C/Left br.Neg;Rt.br.calcifications needs further evaluation. Rt. Diag.probably benign right calcifications/BiRads3/follow up in 6 months:TBC Last pap smear: 08-10-15 Neg        OB History    Gravida Para Term Preterm AB TAB SAB Ectopic Multiple Living   2 2 2       2          Patient Active Problem List   Diagnosis Date Noted  . Complete uterovaginal prolapse 01/10/2016  . Status post total abdominal hysterectomy and bilateral salpingo-oophorectomy 01/10/2016  . Urinary incontinence 11/09/2015  . Vaginal discomfort 08/10/2015  . Rectocele 08/10/2015  . Well adult exam 09/03/2014  . Chronic obstructive airway disease with asthma ([place]) 09/03/2014  . UTI (urinary tract infection) 06/09/2014  . Dysplastic toenail 09/02/2013  . Aphthous ulcer 06/11/2013  . Dark urine 03/18/2013  . Rash 06/08/2012  . PRURITUS  12/01/2010  . EXPOSURE TO HAZARDOUS BODY FLUID, HX OF 05/29/2010  . TOBACCO USER 02/24/2010  . BRONCHITIS, ACUTE 12/05/2009  . HIP PAIN 12/05/2009  . Acute sinusitis 09/02/2009  . RUQ PAIN 02/04/2009  . WRIST PAIN 05/24/2008  . CERVICAL STRAIN 05/24/2008  . WARTS, VIRAL, UNSPECIFIED 04/06/2008  . NEOPLASM, SKIN, UNCERTAIN BEHAVIOR 86/76/1950  . Vitamin D deficiency 11/13/2007  . UPPER RESPIRATORY INFECTION (URI) 11/11/2007  . Hypothyroidism 08/05/2007  . BIPOLAR DEPRESSION 08/05/2007  . GERD 08/05/2007  . Anxiety state 07/23/2007  . LOW BACK PAIN 07/23/2007  . Myalgia and myositis 07/23/2007  . B12 deficiency 05/22/2007  . IRON DEFICIENCY 05/22/2007    Past Medical History  Diagnosis Date  . Depression     bipolar- Dr [name]  . Anxiety   . Low back pain     Dr Arnoldo Morale  . GERD (gastroesophageal reflux disease)   . Vitamin B12 deficiency   . Sinus congestion   . Vertigo   . Fibromyalgia   . Headache   . Chronic back pain   . Fatigue   . Chronic neck pain   . Thyroid disease     hypothyroidsm  . Osteoarthritis   . Pneumonia   . Hypothyroidism     Past Surgical History  Procedure Laterality Date  . Cervical laminectomy  2001 and 2995    Dr Arnoldo Morale  . Tubal ligation  1980  . Abdominal hysterectomy N/A 01/10/2016    Procedure: HYSTERECTOMY ABDOMINAL;  Surgeon: Nunzio Cobbs, MD;  Location: [facility];  Service: Gynecology;  Laterality: N/A;  . Salpingoophorectomy Bilateral 01/10/2016    Procedure:  BILATERAL SALPINGO OOPHORECTOMY;  Surgeon: Nunzio Cobbs, MD;  Location: [facility];  Service: Gynecology;  Laterality: Bilateral;  . Abdominal sacrocolpopexy N/A 01/10/2016    Procedure: ABDOMINO SACROCOLPOPEXY ;  Surgeon: Nunzio Cobbs, MD;  Location: [facility];  Service: Gynecology;  Laterality: N/A;  . Anterior and posterior repair N/A 01/10/2016    Procedure:  POSTERIOR REPAIR (RECTOCELE);  Surgeon: Nunzio Cobbs, MD;  Location: [facility];   Service: Gynecology;  Laterality: N/A;  . Bladder suspension N/A 01/10/2016    Procedure: TRANSVAGINAL TAPE (TVT) PROCEDURE exact midurethral sling;  Surgeon: Nunzio Cobbs, MD;  Location: [facility];  Service: Gynecology;  Laterality: N/A;  . Cysto N/A 01/10/2016    Procedure: CYSTOSCOPY;  Surgeon: Nunzio Cobbs, MD;  Location: [facility];  Service: Gynecology;  Laterality: N/A;    Current Outpatient Prescriptions  Medication Sig Dispense Refill  . Albuterol Sulfate (PROAIR RESPICLICK) 829 (90 BASE) MCG/ACT AEPB Inhale 1-2 puffs into the lungs 4 (four) times daily as needed. 1 each 11  . ALPRAZolam (XANAX) 1 MG tablet Take 1 mg by mouth 4 (four) times daily.     Marland Kitchen amphetamine-dextroamphetamine (ADDERALL) 30 MG tablet Take 15-30 mg by mouth 2 (two) times daily as needed (For focus.). Takes one tablet in the morning, may take one-half tablet in the afternoon if needed    . cholecalciferol (VITAMIN D) 1000 units tablet Take 1,000 Units by mouth once a week.    . Cyanocobalamin 1000 MCG/ML KIT Inject 1 mL as directed every 14 (fourteen) days.    Marland Kitchen eletriptan (RELPAX) 20 MG tablet Take 20 mg by mouth every 2 (two) hours as needed for migraine. may repeat in 2 hours if necessary    . fluticasone (FLONASE) 50 MCG/ACT nasal spray USE 2 SPRAYS IN BOTH NOSTRILS DAILY. (Patient taking differently: Place 2 sprays into both nostrils daily. USE 2 SPRAYS IN BOTH NOSTRILS DAILY.) 16 g 11  . levocetirizine (XYZAL) 5 MG tablet Take 1 tablet (5 mg total) by mouth every evening. 90 tablet 3  . levothyroxine (SYNTHROID, LEVOTHROID) 50 MCG tablet Take 1 tablet (50 mcg total) by mouth daily before breakfast. 30 tablet 11  . oxyCODONE-acetaminophen (PERCOCET/ROXICET) 5-325 MG tablet Take 1-2 tablets by mouth every 4 (four) hours as needed for moderate pain. 30 tablet 0  . pantoprazole (PROTONIX) 40 MG tablet Take 40 mg by mouth daily.    Marland Kitchen Phenylephrine-DM-GG-APAP (MUCINEX SINUS-MAX) 5-10-200-325 MG CAPS  Take 2 capsules by mouth 2 (two) times daily as needed (For sinus congestion.).    Marland Kitchen QUEtiapine (SEROQUEL) 25 MG tablet Take 25 mg by mouth at bedtime.     . triamcinolone cream (KENALOG) 0.5 % Apply 1 application topically 3 (three) times daily. 60 g 3   No current facility-administered medications for this visit.     ALLERGIES: Doxycycline hyclate; Nabumetone; Sulfa antibiotics; Tetracycline hcl; and Tramadol hcl  Family History  Problem Relation Age of Onset  . Arthritis Other   . Heart attack Father     dec heart disease age 26  . Hypertension Father   . Cancer Mother     dec stomach ca--age 15  . Breast cancer Paternal Aunt   . Breast cancer Cousin     Social History   Social History  . Marital Status: Legally Separated    Spouse Name: N/A  . Number of Children: N/A  . Years of Education: N/A  Occupational History  . Not on file.   Social History Main Topics  . Smoking status: Former Smoker -- 0.50 packs/day    Types: Cigarettes    Quit date: 05/06/2011  . Smokeless tobacco: Never Used  . Alcohol Use: No  . Drug Use: No  . Sexual Activity: Yes    Birth Control/ Protection: Post-menopausal, Surgical     Comment: Tubal/Hyst/BSO   Other Topics Concern  . Not on file   Social History Narrative    ROS:  Pertinent items are noted in HPI.  PHYSICAL EXAMINATION:    BP 100/60 mmHg  Pulse 88  Ht 5' 4"  (1.626 m)  Wt 139 lb 6.4 oz (63.231 kg)  BMI 23.92 kg/m2  LMP 10/29/2006 (Approximate)    General appearance: alert, cooperative and appears stated age Lungs: clear to auscultation bilaterally Heart: regular rate and rhythm Abdomen: incision intact and with steristrips,  soft, non-tender; no masses,  no organomegaly Mons pubis:  SP incisions intact (left with steristrips).  Minor ecchymoses and no induration.         Chaperone was present for exam.  ASSESSMENT  Doing well post op overall.  Dizziness/lightheadedness.  PLAN  Will check CBC and CMP.   Increase po intake protein and fluids. OK to remove all steristips in one more week. No driving for at least 2 weeks post op.   No lifting over 10 pounds for 3 months post op. Follow up for 6 week post op visit.    An After Visit Summary was printed and given to the patient.

## 2016-01-17 ENCOUNTER — Telehealth: Payer: Self-pay | Admitting: Emergency Medicine

## 2016-01-17 LAB — COMPREHENSIVE METABOLIC PANEL
ALBUMIN: 4.1 g/dL (ref 3.6–5.1)
ALK PHOS: 64 U/L (ref 33–130)
ALT: 9 U/L (ref 6–29)
AST: 13 U/L (ref 10–35)
BUN: 15 mg/dL (ref 7–25)
CALCIUM: 9.4 mg/dL (ref 8.6–10.4)
CO2: 28 mmol/L (ref 20–31)
Chloride: 103 mmol/L (ref 98–110)
Creat: 0.79 mg/dL (ref 0.50–0.99)
GLUCOSE: 100 mg/dL — AB (ref 65–99)
POTASSIUM: 4.7 mmol/L (ref 3.5–5.3)
Sodium: 139 mmol/L (ref 135–146)
Total Bilirubin: 0.8 mg/dL (ref 0.2–1.2)
Total Protein: 6.8 g/dL (ref 6.1–8.1)

## 2016-01-17 NOTE — Telephone Encounter (Signed)
Call to patient. She is advised of message from Dr. Quincy Simmonds.  She states she is feeling fatigued. Reminded activity as tolerated to rest as needed.  She is trying to eat and drink and will continue to do so.  She is advised to call back as needed or if any concerns and she agrees to do so.   Reminded of next follow up appointment 02/22/16 and patient advised to call back if any concerns prior to appointment.  Routing to provider for final review. Patient agreeable to disposition. Will close encounter.

## 2016-01-17 NOTE — Discharge Summary (Signed)
Physician Discharge Summary  Patient ID: Melody [name] MRN: [id number] DOB/AGE: 61-Aug-1957 61 y.o.  Admit date: 01/10/2016 Discharge date:  01/12/16  Admission Diagnoses: 1.  Complete uterovaginal prolapse.  2.  Genuine stress incontinence.   Discharge Diagnoses:  1.  Complete uterovaginal prolapse.  2.  Genuine stress incontinence.  3.  Status post total abdominal hysterectomy with bilateral salpingo-oophorectomy, abdominal sacral colpopexy, TVT Exact midurethral sling and cystoscopy, posterior colporrhaphy. 4.  Thrombocytopenia - stable.   Active Problems:   Complete uterovaginal prolapse   Status post total abdominal hysterectomy and bilateral salpingo-oophorectomy   Discharged Condition: good  Hospital Course:  The patient was admitted on 01/10/16 for a total abdominal hysterectomy with bilateral  salpingo-oophorectomy, abdominal sacral colpopexy,TVT Exact midurethral sling and cystoscopy, posterior colporrhaphy which were performed without complication while under general anesthesia.  The patient's post op course was uneventful.  She had a Dilaudid PCA for pain control initially, and this was converted over to Percocet and on post op day one when the patient began taking po well.  She ambulated independently and wore PAS and Ted hose for DVT prophylaxis while in bed. Her vaginal packing was removed on post op day one.  Her foley catheter were removed on post op day two, and she voided good volumes and had residual urine volume under 100 cc. The patient had hypotension post op, which was attributed to anesthesia and her pain medication. She did not develop tachycardia or decreased urine output.  She demonstrated no signs of infection during her hospital stay.  The patient's post op day one Hgb was 10.8, and her platelets were 125,000.  Both of these remained stable during her hospitalization.  On chart review, the patient has had prior platelet levels of approximately 140,000 - 170,000.   She had very minimal vaginal bleeding, and her incision(s) demonstrated no signs of erythema or significant drainage.  She was found to be in good condition and ready for discharge on post op day two.  Consults: None  Significant Diagnostic Studies: labs:  See Hospital Course.  Treatments: surgery:  total abdominal hysterectomy with bilateral  salpingo-oophorectomy, abdominal sacral colpopexy,TVT Exact midurethral sling and cystoscopy, posterior colporrhaphy   Discharge Exam: Blood pressure 105/50, pulse 75, temperature 98.6 F (37 C), temperature source Oral, resp. rate 16, height 5' 5"  (1.651 m), weight 142 lb (64.411 kg), last menstrual period 10/29/2006, SpO2 95 %. General: alert and cooperative Resp: clear to auscultation bilaterally Cardio: regular rate and rhythm, S1, S2 normal, no murmur, click, rub or gallop GI: soft, non-tender; bowel sounds normal; no masses, no organomegaly and incision: Pfannenstiel incision clean, dry, intact with steristrips. Left SP incision with separation. Steristrips and benzoin placed. Some SP ecchymoses. No induration.  Vaginal Bleeding: minimal  Disposition: 01-Home or Self Care  Discharge instructions and precautions were reviewed in verbal and written form.     Medication List    STOP taking these medications        azithromycin 250 MG tablet  Commonly known as:  ZITHROMAX     levofloxacin 500 MG tablet  Commonly known as:  LEVAQUIN      TAKE these medications        Albuterol Sulfate 108 (90 Base) MCG/ACT Aepb  Commonly known as:  PROAIR RESPICLICK  Inhale 1-2 puffs into the lungs 4 (four) times daily as needed.     ALPRAZolam 1 MG tablet  Commonly known as:  XANAX  Take 1 mg by mouth 4 (four) times daily.  amphetamine-dextroamphetamine 30 MG tablet  Commonly known as:  ADDERALL  Take 15-30 mg by mouth 2 (two) times daily as needed (For focus.). Takes one tablet in the morning, may take one-half tablet in the afternoon  if needed     cholecalciferol 1000 units tablet  Commonly known as:  VITAMIN D  Take 1,000 Units by mouth once a week.     Cyanocobalamin 1000 MCG/ML Kit  Inject 1 mL as directed every 14 (fourteen) days.     eletriptan 20 MG tablet  Commonly known as:  RELPAX  Take 20 mg by mouth every 2 (two) hours as needed for migraine. may repeat in 2 hours if necessary     fluticasone 50 MCG/ACT nasal spray  Commonly known as:  FLONASE  USE 2 SPRAYS IN BOTH NOSTRILS DAILY.     levocetirizine 5 MG tablet  Commonly known as:  XYZAL  Take 1 tablet (5 mg total) by mouth every evening.     levothyroxine 50 MCG tablet  Commonly known as:  SYNTHROID, LEVOTHROID  Take 1 tablet (50 mcg total) by mouth daily before breakfast.     MUCINEX SINUS-MAX 5-10-200-325 MG Caps  Generic drug:  Phenylephrine-DM-GG-APAP  Take 2 capsules by mouth 2 (two) times daily as needed (For sinus congestion.).     oxyCODONE-acetaminophen 5-325 MG tablet  Commonly known as:  PERCOCET/ROXICET  Take 1-2 tablets by mouth every 4 (four) hours as needed for moderate pain.     pantoprazole 40 MG tablet  Commonly known as:  PROTONIX  Take 40 mg by mouth daily.     QUEtiapine 25 MG tablet  Commonly known as:  SEROQUEL  Take 25 mg by mouth at bedtime.     triamcinolone cream 0.5 %  Commonly known as:  KENALOG  Apply 1 application topically 3 (three) times daily.           Follow-up Information    Follow up with Arloa Koh, MD On 01/16/2016.   Specialty:  Obstetrics and Gynecology   Contact information:   [address] [place] [address] [phone]       Signed: Arloa Koh 01/17/2016, 7:26 PM

## 2016-01-17 NOTE — Telephone Encounter (Signed)
-----   Message from Nunzio Cobbs, MD sent at 01/17/2016 11:00 AM EDT ----- Please inform patient of her lab results which look normal.  This was a nonfasting lab. She was experiencing some dizziness.  I recommend she increase her calorie intake and her fluid intake.  Make sure she is resting enough during the day. She just had a big surgery 3 weeks ago!  Cc- Marisa Sprinkles

## 2016-01-20 LAB — HEMOGLOBIN, FINGERSTICK: HEMOGLOBIN, FINGERSTICK: 12.1 g/dL (ref 12.0–16.0)

## 2016-02-08 ENCOUNTER — Other Ambulatory Visit (INDEPENDENT_AMBULATORY_CARE_PROVIDER_SITE_OTHER): Payer: Medicare Other

## 2016-02-08 ENCOUNTER — Other Ambulatory Visit: Payer: Medicare Other

## 2016-02-08 ENCOUNTER — Ambulatory Visit (INDEPENDENT_AMBULATORY_CARE_PROVIDER_SITE_OTHER): Payer: Medicare Other | Admitting: Internal Medicine

## 2016-02-08 ENCOUNTER — Encounter: Payer: Self-pay | Admitting: Internal Medicine

## 2016-02-08 VITALS — BP 120/80 | HR 81 | Wt 141.0 lb

## 2016-02-08 DIAGNOSIS — F319 Bipolar disorder, unspecified: Secondary | ICD-10-CM

## 2016-02-08 DIAGNOSIS — E538 Deficiency of other specified B group vitamins: Secondary | ICD-10-CM | POA: Diagnosis not present

## 2016-02-08 DIAGNOSIS — E034 Atrophy of thyroid (acquired): Secondary | ICD-10-CM

## 2016-02-08 DIAGNOSIS — N816 Rectocele: Secondary | ICD-10-CM

## 2016-02-08 DIAGNOSIS — R5382 Chronic fatigue, unspecified: Secondary | ICD-10-CM

## 2016-02-08 DIAGNOSIS — E038 Other specified hypothyroidism: Secondary | ICD-10-CM

## 2016-02-08 DIAGNOSIS — R5383 Other fatigue: Secondary | ICD-10-CM | POA: Insufficient documentation

## 2016-02-08 LAB — CBC WITH DIFFERENTIAL/PLATELET
BASOS ABS: 0 10*3/uL (ref 0.0–0.1)
Basophils Relative: 0.6 % (ref 0.0–3.0)
EOS ABS: 0.1 10*3/uL (ref 0.0–0.7)
Eosinophils Relative: 1.8 % (ref 0.0–5.0)
HEMATOCRIT: 39.1 % (ref 36.0–46.0)
HEMOGLOBIN: 13.2 g/dL (ref 12.0–15.0)
LYMPHS PCT: 21.1 % (ref 12.0–46.0)
Lymphs Abs: 1.4 10*3/uL (ref 0.7–4.0)
MCHC: 33.7 g/dL (ref 30.0–36.0)
MCV: 85.6 fl (ref 78.0–100.0)
Monocytes Absolute: 0.3 10*3/uL (ref 0.1–1.0)
Monocytes Relative: 5.4 % (ref 3.0–12.0)
Neutro Abs: 4.6 10*3/uL (ref 1.4–7.7)
Neutrophils Relative %: 71.1 % (ref 43.0–77.0)
PLATELETS: 172 10*3/uL (ref 150.0–400.0)
RBC: 4.57 Mil/uL (ref 3.87–5.11)
RDW: 13.6 % (ref 11.5–15.5)
WBC: 6.4 10*3/uL (ref 4.0–10.5)

## 2016-02-08 LAB — TSH: TSH: 1.9 u[IU]/mL (ref 0.35–4.50)

## 2016-02-08 LAB — HEPATIC FUNCTION PANEL
ALBUMIN: 4.4 g/dL (ref 3.5–5.2)
ALT: 10 U/L (ref 0–35)
AST: 13 U/L (ref 0–37)
Alkaline Phosphatase: 64 U/L (ref 39–117)
Bilirubin, Direct: 0.1 mg/dL (ref 0.0–0.3)
TOTAL PROTEIN: 7.3 g/dL (ref 6.0–8.3)
Total Bilirubin: 0.8 mg/dL (ref 0.2–1.2)

## 2016-02-08 LAB — BASIC METABOLIC PANEL
BUN: 13 mg/dL (ref 6–23)
CHLORIDE: 102 meq/L (ref 96–112)
CO2: 31 meq/L (ref 19–32)
Calcium: 9.8 mg/dL (ref 8.4–10.5)
Creatinine, Ser: 0.86 mg/dL (ref 0.40–1.20)
GFR: 71.25 mL/min (ref >60.00)
GLUCOSE: 87 mg/dL (ref 70–99)
POTASSIUM: 4.2 meq/L (ref 3.5–5.1)
Sodium: 139 mEq/L (ref 135–145)

## 2016-02-08 LAB — VITAMIN B12: VITAMIN B 12: 1062 pg/mL — AB (ref 211–911)

## 2016-02-08 MED ORDER — LEVOTHYROXINE SODIUM 50 MCG PO TABS: 50.0000 ug | ORAL_TABLET | Freq: Every day | ORAL | Status: DC

## 2016-02-08 MED ORDER — FLUTICASONE PROPIONATE 50 MCG/ACT NA SUSP: NASAL | Status: DC

## 2016-02-08 MED ORDER — TRIAMCINOLONE ACETONIDE 0.5 % EX CREA: 1.0000 "application " | TOPICAL_CREAM | Freq: Three times a day (TID) | CUTANEOUS | Status: DC

## 2016-02-08 MED ORDER — CYANOCOBALAMIN 1000 MCG/ML IJ SOLN: 1000.0000 ug | INTRAMUSCULAR | Status: DC

## 2016-02-08 MED ORDER — OXYCODONE-ACETAMINOPHEN 5-325 MG PO TABS: 1.0000 | ORAL_TABLET | Freq: Two times a day (BID) | ORAL | Status: DC | PRN

## 2016-02-08 MED ORDER — LEVOCETIRIZINE DIHYDROCHLORIDE 5 MG PO TABS: 5.0000 mg | ORAL_TABLET | Freq: Every evening | ORAL | Status: DC

## 2016-02-08 NOTE — Assessment & Plan Note (Signed)
F/u w/Dr Kaur ?

## 2016-02-08 NOTE — Assessment & Plan Note (Signed)
2017 s/p repair

## 2016-02-08 NOTE — Assessment & Plan Note (Addendum)
On Levothroid Labs 

## 2016-02-08 NOTE — Assessment & Plan Note (Signed)
On B12 

## 2016-02-08 NOTE — Progress Notes (Signed)
Pre visit review using our clinic review tool, if applicable. No additional management support is needed unless otherwise documented below in the visit note. 

## 2016-02-08 NOTE — Assessment & Plan Note (Signed)
[facility] - Multifactorial Labs

## 2016-02-20 ENCOUNTER — Encounter: Payer: Self-pay | Admitting: Obstetrics and Gynecology

## 2016-02-20 ENCOUNTER — Ambulatory Visit (INDEPENDENT_AMBULATORY_CARE_PROVIDER_SITE_OTHER): Payer: Medicare Other | Admitting: Obstetrics and Gynecology

## 2016-02-20 ENCOUNTER — Telehealth: Payer: Self-pay | Admitting: *Deleted

## 2016-02-20 VITALS — BP 108/70 | HR 80 | Temp 97.7°F | Ht 64.0 in | Wt 140.8 lb

## 2016-02-20 DIAGNOSIS — R3129 Other microscopic hematuria: Secondary | ICD-10-CM | POA: Diagnosis not present

## 2016-02-20 DIAGNOSIS — Z9889 Other specified postprocedural states: Secondary | ICD-10-CM

## 2016-02-20 LAB — URINALYSIS, MICROSCOPIC ONLY
BACTERIA UA: NONE SEEN [HPF]
CASTS: NONE SEEN [LPF]
CRYSTALS: NONE SEEN [HPF]
Squamous Epithelial / LPF: NONE SEEN [HPF] (ref <=5)
Yeast: NONE SEEN [HPF]

## 2016-02-20 NOTE — Progress Notes (Signed)
Patient ID: Melody [name], female   DOB: 12/20/1955, 60 y.o.   MRN: [id number] GYNECOLOGY  VISIT   HPI: 60 y.o.   Legally Separated  Caucasian  female   [phone] with Patient's last menstrual period was 10/29/2006 (approximate).   here for RLQ pain and fatigue.  Patient complains of pain at the end of stream after urination.    Is status post total abdominal hysterectomy with BSO/abdominosacrocolpopexy/TVT Exact midurethral sling/cystoscopy/posterior colporrhaphy 01/10/16. Patient squatted down, sat up, and felt right lower quadrant pain and pink spotting.  Some bloating and decreased appetite, which she states is not new for her.  Denies vomiting and diarrhea. Last BM was Saturday.   Takes Percocet regularly for her back pain.   Feels a twinges at the end of urination.  Not really having dysuria.   Not lifting over 10 pounds at home.   Saw Dr. Alain Marion for fatigue, and had normal labs on 02/08/16.  Temp: 97.7 Urine today:  Trace RBCs.  GYNECOLOGIC HISTORY: Patient's last menstrual period was 10/29/2006 (approximate). Contraception:Tubal/Postmenopausal/Hysterectomy Menopausal hormone therapy:  None  Last mammogram: 01-05-16 Density Cat.C/Left br.Neg;Rt.br.calcifications needs further evaluation. Rt. Diag.probably benign right calcifications/BiRads3/follow up in 6 months:TBC Last pap smear: 08-10-15 Neg           OB History    Gravida Para Term Preterm AB TAB SAB Ectopic Multiple Living   2 2 2       2          Patient Active Problem List   Diagnosis Date Noted  . Fatigue 02/08/2016  . Complete uterovaginal prolapse 01/10/2016  . Status post total abdominal hysterectomy and bilateral salpingo-oophorectomy 01/10/2016  . Urinary incontinence 11/09/2015  . Vaginal discomfort 08/10/2015  . Rectocele 08/10/2015  . Well adult exam 09/03/2014  . Chronic obstructive airway disease with asthma ([place]) 09/03/2014  . UTI (urinary tract infection) 06/09/2014  . Dysplastic toenail  09/02/2013  . Aphthous ulcer 06/11/2013  . Dark urine 03/18/2013  . Rash 06/08/2012  . PRURITUS 12/01/2010  . EXPOSURE TO HAZARDOUS BODY FLUID, HX OF 05/29/2010  . TOBACCO USER 02/24/2010  . BRONCHITIS, ACUTE 12/05/2009  . HIP PAIN 12/05/2009  . Acute sinusitis 09/02/2009  . RUQ PAIN 02/04/2009  . WRIST PAIN 05/24/2008  . CERVICAL STRAIN 05/24/2008  . WARTS, VIRAL, UNSPECIFIED 04/06/2008  . NEOPLASM, SKIN, UNCERTAIN BEHAVIOR 0000000  . Vitamin D deficiency 11/13/2007  . UPPER RESPIRATORY INFECTION (URI) 11/11/2007  . Hypothyroidism 08/05/2007  . Bipolar I disorder ([place]) 08/05/2007  . GERD 08/05/2007  . Anxiety state 07/23/2007  . LOW BACK PAIN 07/23/2007  . Myalgia and myositis 07/23/2007  . B12 deficiency 05/22/2007  . IRON DEFICIENCY 05/22/2007    Past Medical History  Diagnosis Date  . Depression     bipolar- Dr [name]  . Anxiety   . Low back pain     Dr Arnoldo Morale  . GERD (gastroesophageal reflux disease)   . Vitamin B12 deficiency   . Sinus congestion   . Vertigo   . Fibromyalgia   . Headache   . Chronic back pain   . Fatigue   . Chronic neck pain   . Thyroid disease     hypothyroidsm  . Osteoarthritis   . Pneumonia   . Hypothyroidism     Past Surgical History  Procedure Laterality Date  . Cervical laminectomy  2001 and 2995    Dr Arnoldo Morale  . Tubal ligation  1980  . Abdominal hysterectomy N/A 01/10/2016    Procedure: HYSTERECTOMY  ABDOMINAL;  Surgeon: Nunzio Cobbs, MD;  Location: [facility];  Service: Gynecology;  Laterality: N/A;  . Salpingoophorectomy Bilateral 01/10/2016    Procedure: BILATERAL SALPINGO OOPHORECTOMY;  Surgeon: Nunzio Cobbs, MD;  Location: [facility];  Service: Gynecology;  Laterality: Bilateral;  . Abdominal sacrocolpopexy N/A 01/10/2016    Procedure: ABDOMINO SACROCOLPOPEXY ;  Surgeon: Nunzio Cobbs, MD;  Location: [facility];  Service: Gynecology;  Laterality: N/A;  . Anterior and posterior repair N/A 01/10/2016     Procedure:  POSTERIOR REPAIR (RECTOCELE);  Surgeon: Nunzio Cobbs, MD;  Location: [facility];  Service: Gynecology;  Laterality: N/A;  . Bladder suspension N/A 01/10/2016    Procedure: TRANSVAGINAL TAPE (TVT) PROCEDURE exact midurethral sling;  Surgeon: Nunzio Cobbs, MD;  Location: [facility];  Service: Gynecology;  Laterality: N/A;  . Cysto N/A 01/10/2016    Procedure: CYSTOSCOPY;  Surgeon: Nunzio Cobbs, MD;  Location: [facility];  Service: Gynecology;  Laterality: N/A;    Current Outpatient Prescriptions  Medication Sig Dispense Refill  . Albuterol Sulfate (PROAIR RESPICLICK) 123XX123 (90 BASE) MCG/ACT AEPB Inhale 1-2 puffs into the lungs 4 (four) times daily as needed. 1 each 11  . ALPRAZolam (XANAX) 1 MG tablet Take 1 mg by mouth 4 (four) times daily.     Marland Kitchen amphetamine-dextroamphetamine (ADDERALL) 30 MG tablet Take 15-30 mg by mouth 2 (two) times daily as needed (For focus.). Takes one tablet in the morning, may take one-half tablet in the afternoon if needed    . cholecalciferol (VITAMIN D) 1000 units tablet Take 1,000 Units by mouth once a week.    . cyanocobalamin (COBAL-1000) 1000 MCG/ML injection Inject 1 mL (1,000 mcg total) into the muscle every 14 (fourteen) days. 10 mL 11  . eletriptan (RELPAX) 20 MG tablet Take 20 mg by mouth every 2 (two) hours as needed for migraine. may repeat in 2 hours if necessary    . fluticasone (FLONASE) 50 MCG/ACT nasal spray USE 2 SPRAYS IN BOTH NOSTRILS DAILY. 16 g 11  . levocetirizine (XYZAL) 5 MG tablet Take 1 tablet (5 mg total) by mouth every evening. 30 tablet 5  . levothyroxine (SYNTHROID, LEVOTHROID) 50 MCG tablet Take 1 tablet (50 mcg total) by mouth daily before breakfast. 30 tablet 11  . oxyCODONE-acetaminophen (PERCOCET/ROXICET) 5-325 MG tablet Take 1 tablet by mouth 2 (two) times daily as needed for severe pain. 60 tablet 0  . pantoprazole (PROTONIX) 40 MG tablet Take 40 mg by mouth daily.    . QUEtiapine (SEROQUEL) 25 MG  tablet Take 25 mg by mouth at bedtime.     . triamcinolone cream (KENALOG) 0.5 % Apply 1 application topically 3 (three) times daily. 60 g 3   No current facility-administered medications for this visit.     ALLERGIES: Doxycycline hyclate; Nabumetone; Sulfa antibiotics; Tetracycline hcl; and Tramadol hcl  Family History  Problem Relation Age of Onset  . Arthritis Other   . Heart attack Father     dec heart disease age 40  . Hypertension Father   . Cancer Mother     dec stomach ca--age 51  . Breast cancer Paternal Aunt   . Breast cancer Cousin     Social History   Social History  . Marital Status: Legally Separated    Spouse Name: N/A  . Number of Children: N/A  . Years of Education: N/A   Occupational History  . Not on file.  Social History Main Topics  . Smoking status: Former Smoker -- 0.50 packs/day    Types: Cigarettes    Quit date: 05/06/2011  . Smokeless tobacco: Never Used  . Alcohol Use: No  . Drug Use: No  . Sexual Activity: Yes    Birth Control/ Protection: Post-menopausal, Surgical     Comment: Tubal/Hyst/BSO   Other Topics Concern  . Not on file   Social History Narrative    ROS:  Pertinent items are noted in HPI.  PHYSICAL EXAMINATION:    BP 108/70 mmHg  Pulse 80  Temp(Src) 97.7 F (36.5 C) (Oral)  Ht 5\' 4"  (1.626 m)  Wt 140 lb 12.8 oz (63.866 kg)  BMI 24.16 kg/m2  LMP 10/29/2006 (Approximate)    General appearance: alert, cooperative and appears stated age Lungs: clear to auscultation bilaterally Heart: regular rate and rhythm Abdomen: incision(s):Yes.  ,Pfannenstiel incision, soft, non-tender, no masses,  no organomegaly Extremities: extremities normal, atraumatic, no cyanosis or edema   Pelvic: External genitalia:  no lesions              Urethra:  normal appearing urethra with no masses, tenderness or lesions              Bartholins and Skenes: normal                 Vagina: normal appearing vagina with normal color and discharge,  no lesions.  Posterior vaginal wall with minor bleeding from suture line.  Vaginal cuff and anterior vaginal walls intact.  No palpable mesh material.                Cervix: absent               Bimanual Exam:  Uterus:  uterus absent              Adnexa: no mass, fullness, tenderness               Chaperone was present for exam.  ASSESSMENT  RLQ pain.  No acute abdomen.  Healing well from surgery.  Urinary discomfort.  No true dysuria.   PLAN  Urine micro and culture.  Reassurance given regarding healing from surgery.  Continue decreased activity.  Follow up in 6 weeks.  Return for bright red vaginal bleeding or increasing pain.    An After Visit Summary was printed and given to the patient.  ___25___ minutes face to face time of which over 50% was spent in counseling.

## 2016-02-20 NOTE — Telephone Encounter (Signed)
Patient noticed a Haughey pink discharge yesterday and has also been experiencing some pain like a "pulling in her naval area" as well as a shooting pain after she urinates. Best # to reach: [phone]

## 2016-02-20 NOTE — Telephone Encounter (Signed)
Spoke with patient. Patient had an abdominal hysterectomy on 01/10/2016. Reports yesterday she noticed she had Jesus pink discharge. Denies any discharge today. Reports she is experiencing intermittent right lower quadrant pain that extends below her umbilicus. Describes pain as a "pulling" pain. Reports when she uses the restroom she is experiencing a sharp shooting pain. Denies any urinary frequency or difficulty emptying her bladder. States "I am feeling hot and then cold throughout the day." Denies lower back pain or fevers. Advised she will need to be seen in the office for further evaluation today. She is agreeable. Appointment scheduled for today 02/20/2016 at 3 pm with Dr.Silva. She is agreeable to date and time.  Routing to provider for final review. Patient agreeable to disposition. Will close encounter.

## 2016-02-21 LAB — URINE CULTURE: Colony Count: 3000

## 2016-02-22 ENCOUNTER — Ambulatory Visit: Payer: Medicare Other | Admitting: Obstetrics and Gynecology

## 2016-02-27 ENCOUNTER — Other Ambulatory Visit: Payer: Self-pay | Admitting: Internal Medicine

## 2016-03-21 ENCOUNTER — Other Ambulatory Visit: Payer: Self-pay | Admitting: *Deleted

## 2016-03-21 MED ORDER — ALBUTEROL SULFATE HFA 108 (90 BASE) MCG/ACT IN AERS: INHALATION_SPRAY | RESPIRATORY_TRACT | Status: DC

## 2016-04-04 ENCOUNTER — Ambulatory Visit (INDEPENDENT_AMBULATORY_CARE_PROVIDER_SITE_OTHER): Payer: Medicare Other | Admitting: Obstetrics and Gynecology

## 2016-04-04 VITALS — BP 120/80 | HR 96 | Resp 20 | Wt 138.4 lb

## 2016-04-04 DIAGNOSIS — Z9889 Other specified postprocedural states: Secondary | ICD-10-CM

## 2016-04-04 NOTE — Progress Notes (Signed)
GYNECOLOGY  VISIT   HPI: 60 y.o.   Legally Separated  Caucasian  female   [phone] with Patient's last menstrual period was 10/29/2006 (approximate).   here for post-op. Prolapse repair done on 01/10/16.  No fecal incontinence.  No leak of urine with cough or sneeze.   Some right sided puling at night when she rolls over in bed.   No vaginal bleeding.  Some vaginal discharge.   GYNECOLOGIC HISTORY: Patient's last menstrual period was 10/29/2006 (approximate). Contraception:  Menopause Menopausal hormone therapy: None Last mammogram:   01/02/16-Diagnostic of R breast-BIRADS3 Last pap smear: 10/29/13        OB History    Gravida Para Term Preterm AB TAB SAB Ectopic Multiple Living   2 2 2       2          Patient Active Problem List   Diagnosis Date Noted  . Fatigue 02/08/2016  . Complete uterovaginal prolapse 01/10/2016  . Status post total abdominal hysterectomy and bilateral salpingo-oophorectomy 01/10/2016  . Urinary incontinence 11/09/2015  . Vaginal discomfort 08/10/2015  . Rectocele 08/10/2015  . Well adult exam 09/03/2014  . Chronic obstructive airway disease with asthma ([place]) 09/03/2014  . UTI (urinary tract infection) 06/09/2014  . Dysplastic toenail 09/02/2013  . Aphthous ulcer 06/11/2013  . Dark urine 03/18/2013  . Rash 06/08/2012  . PRURITUS 12/01/2010  . EXPOSURE TO HAZARDOUS BODY FLUID, HX OF 05/29/2010  . TOBACCO USER 02/24/2010  . BRONCHITIS, ACUTE 12/05/2009  . HIP PAIN 12/05/2009  . Acute sinusitis 09/02/2009  . RUQ PAIN 02/04/2009  . WRIST PAIN 05/24/2008  . CERVICAL STRAIN 05/24/2008  . WARTS, VIRAL, UNSPECIFIED 04/06/2008  . NEOPLASM, SKIN, UNCERTAIN BEHAVIOR 0000000  . Vitamin D deficiency 11/13/2007  . UPPER RESPIRATORY INFECTION (URI) 11/11/2007  . Hypothyroidism 08/05/2007  . Bipolar I disorder ([place]) 08/05/2007  . GERD 08/05/2007  . Anxiety state 07/23/2007  . LOW BACK PAIN 07/23/2007  . Myalgia and myositis 07/23/2007  . B12  deficiency 05/22/2007  . IRON DEFICIENCY 05/22/2007    Past Medical History  Diagnosis Date  . Depression     bipolar- Dr [name]  . Anxiety   . Low back pain     Dr Arnoldo Morale  . GERD (gastroesophageal reflux disease)   . Vitamin B12 deficiency   . Sinus congestion   . Vertigo   . Fibromyalgia   . Headache   . Chronic back pain   . Fatigue   . Chronic neck pain   . Thyroid disease     hypothyroidsm  . Osteoarthritis   . Pneumonia   . Hypothyroidism     Past Surgical History  Procedure Laterality Date  . Cervical laminectomy  2001 and 2995    Dr Arnoldo Morale  . Tubal ligation  1980  . Abdominal hysterectomy N/A 01/10/2016    Procedure: HYSTERECTOMY ABDOMINAL;  Surgeon: Nunzio Cobbs, MD;  Location: [facility];  Service: Gynecology;  Laterality: N/A;  . Salpingoophorectomy Bilateral 01/10/2016    Procedure: BILATERAL SALPINGO OOPHORECTOMY;  Surgeon: Nunzio Cobbs, MD;  Location: [facility];  Service: Gynecology;  Laterality: Bilateral;  . Abdominal sacrocolpopexy N/A 01/10/2016    Procedure: ABDOMINO SACROCOLPOPEXY ;  Surgeon: Nunzio Cobbs, MD;  Location: [facility];  Service: Gynecology;  Laterality: N/A;  . Anterior and posterior repair N/A 01/10/2016    Procedure:  POSTERIOR REPAIR (RECTOCELE);  Surgeon: Nunzio Cobbs, MD;  Location: [facility];  Service:  Gynecology;  Laterality: N/A;  . Bladder suspension N/A 01/10/2016    Procedure: TRANSVAGINAL TAPE (TVT) PROCEDURE exact midurethral sling;  Surgeon: Nunzio Cobbs, MD;  Location: [facility];  Service: Gynecology;  Laterality: N/A;  . Cysto N/A 01/10/2016    Procedure: CYSTOSCOPY;  Surgeon: Nunzio Cobbs, MD;  Location: [facility];  Service: Gynecology;  Laterality: N/A;    Current Outpatient Prescriptions  Medication Sig Dispense Refill  . albuterol (PROAIR HFA) 108 (90 Base) MCG/ACT inhaler INHALE 2 PUFFS BY MOUTH EVERY FOUR HOURS AS NEEDED 8.5 g 3  . Albuterol Sulfate (PROAIR  RESPICLICK) 123XX123 (90 BASE) MCG/ACT AEPB Inhale 1-2 puffs into the lungs 4 (four) times daily as needed. 1 each 11  . ALPRAZolam (XANAX) 1 MG tablet Take 1 mg by mouth 4 (four) times daily.     Marland Kitchen amphetamine-dextroamphetamine (ADDERALL) 30 MG tablet Take 15-30 mg by mouth 2 (two) times daily as needed (For focus.). Takes one tablet in the morning, may take one-half tablet in the afternoon if needed    . cholecalciferol (VITAMIN D) 1000 units tablet Take 1,000 Units by mouth once a week.    . cyanocobalamin (COBAL-1000) 1000 MCG/ML injection Inject 1 mL (1,000 mcg total) into the muscle every 14 (fourteen) days. 10 mL 11  . eletriptan (RELPAX) 20 MG tablet Take 20 mg by mouth every 2 (two) hours as needed for migraine. may repeat in 2 hours if necessary    . fluticasone (FLONASE) 50 MCG/ACT nasal spray USE 2 SPRAYS IN BOTH NOSTRILS DAILY. 16 g 11  . levocetirizine (XYZAL) 5 MG tablet Take 1 tablet (5 mg total) by mouth every evening. 30 tablet 5  . levothyroxine (SYNTHROID, LEVOTHROID) 50 MCG tablet Take 1 tablet (50 mcg total) by mouth daily before breakfast. 30 tablet 11  . oxyCODONE-acetaminophen (PERCOCET/ROXICET) 5-325 MG tablet Take 1 tablet by mouth 2 (two) times daily as needed for severe pain. 60 tablet 0  . pantoprazole (PROTONIX) 40 MG tablet Take 40 mg by mouth daily.    . QUEtiapine (SEROQUEL) 25 MG tablet Take 25 mg by mouth at bedtime.     . triamcinolone cream (KENALOG) 0.5 % Apply 1 application topically 3 (three) times daily. 60 g 3   No current facility-administered medications for this visit.     ALLERGIES: Doxycycline hyclate; Nabumetone; Sulfa antibiotics; Tetracycline hcl; and Tramadol hcl  Family History  Problem Relation Age of Onset  . Arthritis Other   . Heart attack Father     dec heart disease age 57  . Hypertension Father   . Cancer Mother     dec stomach ca--age 29  . Breast cancer Paternal Aunt   . Breast cancer Cousin     Social History   Social History   . Marital Status: Legally Separated    Spouse Name: N/A  . Number of Children: N/A  . Years of Education: N/A   Occupational History  . Not on file.   Social History Main Topics  . Smoking status: Former Smoker -- 0.50 packs/day    Types: Cigarettes    Quit date: 05/06/2011  . Smokeless tobacco: Never Used  . Alcohol Use: No  . Drug Use: No  . Sexual Activity: Yes    Birth Control/ Protection: Post-menopausal, Surgical     Comment: Tubal/Hyst/BSO   Other Topics Concern  . Not on file   Social History Narrative    ROS:  Pertinent items are noted in HPI.  PHYSICAL EXAMINATION:    BP 120/80 mmHg  Pulse 96  Resp 20  Wt 138 lb 6.4 oz (62.778 kg)  LMP 10/29/2006 (Approximate)    General appearance: alert, cooperative and appears stated age   Abdomen: incision(s):Yes.  , ___Pfannenstiel_________  soft, non-tender, no masses,  no organomegaly    Pelvic: External genitalia:  no lesions              Urethra:  normal appearing urethra with no masses, tenderness or lesions              Bartholins and Skenes: normal                 Vagina: small amount of granulation tissue of posterior vaginal wall - 7 mm - treated with AgNO3 and then removed by scraping it away with a scissors tip.  Tolerated well.  Minimal EBL. Good vaginal support and elevation.  No sling exposure or erosion of vaginal cuff or suburethral area.              Cervix: absent           Bimanual Exam:  Uterus:  uterus absent              Adnexa: normal adnexa and no mass, fullness, tenderness    Chaperone was present for exam.  ASSESSMENT  Doing well status post TAH/BSO/abdominosacrocolpopexy/ TVT Exact midurethral sling and cystoscopy, posterior colporrhaphy.  PLAN  OK to return to all normal activities in one month.  Follow up here yearly at the anniversary of her surgery date.  Call for any concerns.    An After Visit Summary was printed and given to the patient.

## 2016-04-06 ENCOUNTER — Encounter: Payer: Self-pay | Admitting: Obstetrics and Gynecology

## 2016-04-24 ENCOUNTER — Telehealth: Payer: Self-pay | Admitting: Obstetrics and Gynecology

## 2016-04-24 NOTE — Telephone Encounter (Signed)
Patient called and left a message on the answering machine at lunch. She said, "I have a question for the nurse."

## 2016-04-24 NOTE — Telephone Encounter (Signed)
Spoke with patient. Advised I have spoken with Dr.Miller and she recommends that the patient come in to be seen in the office for evaluation with Dr.Silva. The patient is agreeable. Patient declines an appointment until Friday. Appointment scheduled for 04/27/2016 at 3 pm with Dr.Silva. She is agreeable to date and time. Advised if her pain worsens or she develops new symptoms she will need to be seen in the office for evaluation earlier. If pain worsens overnight she may be seen at a local ER. She is agreeable and verbalizes understanding.  Routing to [place] for review before closing.

## 2016-04-24 NOTE — Telephone Encounter (Signed)
Spoke with patient. Patient has surgery on 01/10/2016 with Dr.Silva. Patient was seen on 04/04/2016 in office with Dr.Silva for post op visit and reported slight pulling on her right side with rolling over. 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 "I am not sure if I pulled something or if it is from the surgery." Reports pain is extending around her right side. Pain is worse with standing and movement, but relieved with laying down. Patient has been resting today and feels the pain is the same when she stands. Rates the pain a 7/10 with standing. Denies any vaginal bleeding, fever, or chills. Reports she has been taking 1 Percocet at night for pain with relief. Has not taken any pain medication today. Patient is asking if she needs to be seen with Dr.Silva for evaluation. "I think I may just need more rest." Offered patient an appointment with Dr.Silva for tomorrow, but patient declines due to her schedule. Advised I will speak with covering provider Dr.Miller regarding recommendations and return call. She is agreeable.

## 2016-04-27 ENCOUNTER — Ambulatory Visit (INDEPENDENT_AMBULATORY_CARE_PROVIDER_SITE_OTHER): Payer: Medicare Other | Admitting: Obstetrics and Gynecology

## 2016-04-27 ENCOUNTER — Encounter: Payer: Self-pay | Admitting: Obstetrics and Gynecology

## 2016-04-27 VITALS — BP 122/76 | HR 70 | Temp 97.6°F | Ht 64.0 in | Wt 136.0 lb

## 2016-04-27 DIAGNOSIS — R8290 Unspecified abnormal findings in urine: Secondary | ICD-10-CM | POA: Diagnosis not present

## 2016-04-27 DIAGNOSIS — R5383 Other fatigue: Secondary | ICD-10-CM

## 2016-04-27 DIAGNOSIS — F329 Major depressive disorder, single episode, unspecified: Secondary | ICD-10-CM | POA: Diagnosis not present

## 2016-04-27 DIAGNOSIS — R102 Pelvic and perineal pain: Secondary | ICD-10-CM

## 2016-04-27 DIAGNOSIS — F32A Depression, unspecified: Secondary | ICD-10-CM

## 2016-04-27 LAB — POCT URINALYSIS DIPSTICK
Bilirubin, UA: NEGATIVE
Glucose, UA: NEGATIVE
KETONES UA: NEGATIVE
NITRITE UA: NEGATIVE
PROTEIN UA: NEGATIVE
Urobilinogen, UA: NEGATIVE
pH, UA: 5

## 2016-04-27 NOTE — Progress Notes (Signed)
Patient ID: Melody [name], female   DOB: 1956/07/16, 60 y.o.   MRN: [id number] GYNECOLOGY  VISIT   HPI: 60 y.o.   Legally Separated  Caucasian  female   [phone] with Patient's last menstrual period was 10/29/2006 (approximate).   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 This is slightly below navel area.  Pain is radiating from her right umbilical region to her right iliac crest.  Resting helped the pain.  No pain with urination.  Voiding well.  No sure if she has had some urinary leakage or if is is vaginal drainage.  Bowel function is better than before surgery.  BMs are mostly daily.   Has decreased her Percocet use for her back pain. Uses one to 1.5 per week in total.  Feels tired in general.  Not getting out as much or doing as much physical activity since her marital separation.  Just saw Dr. [name].  No adjustment of her medications.  Does not have a therapist.   Urine Dip: Trace WBCs, Trace RBCs Temp: 98.7  GYNECOLOGIC HISTORY: Patient's last menstrual period was 10/29/2006 (approximate). Contraception:  Tubal/postmenopausal/hysterectomy Menopausal hormone therapy:  none Last mammogram:  01-05-16 Density Cat.C/Left br.Neg;Rt.br.calcifications needs further evaluation. Rt. Diag.probably benign right calcifications/BiRads3/follow up in 6 months:TBC Last pap smear:  08-10-15 Neg        OB History    Gravida Para Term Preterm AB TAB SAB Ectopic Multiple Living   2 2 2       2          Patient Active Problem List   Diagnosis Date Noted  . Fatigue 02/08/2016  . Complete uterovaginal prolapse 01/10/2016  . Status post total abdominal hysterectomy and bilateral salpingo-oophorectomy 01/10/2016  . Urinary incontinence 11/09/2015  . Vaginal discomfort 08/10/2015  . Rectocele 08/10/2015  . Well adult exam 09/03/2014  . Chronic obstructive airway disease with asthma ([place]) 09/03/2014  . UTI (urinary tract  infection) 06/09/2014  . Dysplastic toenail 09/02/2013  . Aphthous ulcer 06/11/2013  . Dark urine 03/18/2013  . Rash 06/08/2012  . PRURITUS 12/01/2010  . EXPOSURE TO HAZARDOUS BODY FLUID, HX OF 05/29/2010  . TOBACCO USER 02/24/2010  . BRONCHITIS, ACUTE 12/05/2009  . HIP PAIN 12/05/2009  . Acute sinusitis 09/02/2009  . RUQ PAIN 02/04/2009  . WRIST PAIN 05/24/2008  . CERVICAL STRAIN 05/24/2008  . WARTS, VIRAL, UNSPECIFIED 04/06/2008  . NEOPLASM, SKIN, UNCERTAIN BEHAVIOR 0000000  . Vitamin D deficiency 11/13/2007  . UPPER RESPIRATORY INFECTION (URI) 11/11/2007  . Hypothyroidism 08/05/2007  . Bipolar I disorder ([place]) 08/05/2007  . GERD 08/05/2007  . Anxiety state 07/23/2007  . LOW BACK PAIN 07/23/2007  . Myalgia and myositis 07/23/2007  . B12 deficiency 05/22/2007  . IRON DEFICIENCY 05/22/2007    Past Medical History  Diagnosis Date  . Depression     bipolar- Dr [name]  . Anxiety   . Low back pain     Dr Arnoldo Morale  . GERD (gastroesophageal reflux disease)   . Vitamin B12 deficiency   . Sinus congestion   . Vertigo   . Fibromyalgia   . Headache   . Chronic back pain   . Fatigue   . Chronic neck pain   . Thyroid disease     hypothyroidsm  . Osteoarthritis   . Pneumonia   . Hypothyroidism     Past Surgical History  Procedure Laterality Date  . Cervical laminectomy  2001 and 2995  Dr Arnoldo Morale  . Tubal ligation  1980  . Abdominal hysterectomy N/A 01/10/2016    Procedure: HYSTERECTOMY ABDOMINAL;  Surgeon: Nunzio Cobbs, MD;  Location: [facility];  Service: Gynecology;  Laterality: N/A;  . Salpingoophorectomy Bilateral 01/10/2016    Procedure: BILATERAL SALPINGO OOPHORECTOMY;  Surgeon: Nunzio Cobbs, MD;  Location: [facility];  Service: Gynecology;  Laterality: Bilateral;  . Abdominal sacrocolpopexy N/A 01/10/2016    Procedure: ABDOMINO SACROCOLPOPEXY ;  Surgeon: Nunzio Cobbs, MD;  Location: [facility];  Service: Gynecology;  Laterality: N/A;  .  Anterior and posterior repair N/A 01/10/2016    Procedure:  POSTERIOR REPAIR (RECTOCELE);  Surgeon: Nunzio Cobbs, MD;  Location: [facility];  Service: Gynecology;  Laterality: N/A;  . Bladder suspension N/A 01/10/2016    Procedure: TRANSVAGINAL TAPE (TVT) PROCEDURE exact midurethral sling;  Surgeon: Nunzio Cobbs, MD;  Location: [facility];  Service: Gynecology;  Laterality: N/A;  . Cysto N/A 01/10/2016    Procedure: CYSTOSCOPY;  Surgeon: Nunzio Cobbs, MD;  Location: [facility];  Service: Gynecology;  Laterality: N/A;    Current Outpatient Prescriptions  Medication Sig Dispense Refill  . albuterol (PROAIR HFA) 108 (90 Base) MCG/ACT inhaler INHALE 2 PUFFS BY MOUTH EVERY FOUR HOURS AS NEEDED 8.5 g 3  . Albuterol Sulfate (PROAIR RESPICLICK) 123XX123 (90 BASE) MCG/ACT AEPB Inhale 1-2 puffs into the lungs 4 (four) times daily as needed. 1 each 11  . ALPRAZolam (XANAX) 1 MG tablet Take 1 mg by mouth 4 (four) times daily.     Marland Kitchen amphetamine-dextroamphetamine (ADDERALL) 30 MG tablet Take 15-30 mg by mouth 2 (two) times daily as needed (For focus.). Takes one tablet in the morning, may take one-half tablet in the afternoon if needed    . cholecalciferol (VITAMIN D) 1000 units tablet Take 1,000 Units by mouth once a week.    . cyanocobalamin (COBAL-1000) 1000 MCG/ML injection Inject 1 mL (1,000 mcg total) into the muscle every 14 (fourteen) days. 10 mL 11  . eletriptan (RELPAX) 20 MG tablet Take 20 mg by mouth every 2 (two) hours as needed for migraine. may repeat in 2 hours if necessary    . fluticasone (FLONASE) 50 MCG/ACT nasal spray USE 2 SPRAYS IN BOTH NOSTRILS DAILY. 16 g 11  . levocetirizine (XYZAL) 5 MG tablet Take 1 tablet (5 mg total) by mouth every evening. 30 tablet 5  . levothyroxine (SYNTHROID, LEVOTHROID) 50 MCG tablet Take 1 tablet (50 mcg total) by mouth daily before breakfast. 30 tablet 11  . oxyCODONE-acetaminophen (PERCOCET/ROXICET) 5-325 MG tablet Take 1 tablet by mouth 2  (two) times daily as needed for severe pain. 60 tablet 0  . pantoprazole (PROTONIX) 40 MG tablet Take 40 mg by mouth daily.    . QUEtiapine (SEROQUEL) 25 MG tablet Take 25 mg by mouth at bedtime.     . triamcinolone cream (KENALOG) 0.5 % Apply 1 application topically 3 (three) times daily. 60 g 3   No current facility-administered medications for this visit.     ALLERGIES: Doxycycline hyclate; Nabumetone; Sulfa antibiotics; Tetracycline hcl; and Tramadol hcl  Family History  Problem Relation Age of Onset  . Arthritis Other   . Heart attack Father     dec heart disease age 37  . Hypertension Father   . Cancer Mother     dec stomach ca--age 48  . Breast cancer Paternal Aunt   . Breast cancer Cousin  Social History   Social History  . Marital Status: Legally Separated    Spouse Name: N/A  . Number of Children: N/A  . Years of Education: N/A   Occupational History  . Not on file.   Social History Main Topics  . Smoking status: Former Smoker -- 0.50 packs/day    Types: Cigarettes    Quit date: 05/06/2011  . Smokeless tobacco: Never Used  . Alcohol Use: No  . Drug Use: No  . Sexual Activity: Yes    Birth Control/ Protection: Post-menopausal, Surgical     Comment: Tubal/Hyst/BSO   Other Topics Concern  . Not on file   Social History Narrative    ROS:  Pertinent items are noted in HPI.  PHYSICAL EXAMINATION:    BP 122/76 mmHg  Pulse 70  Temp(Src) 97.6 F (36.4 C) (Oral)  Ht 5\' 4"  (1.626 m)  Wt 136 lb (61.689 kg)  BMI 23.33 kg/m2  LMP 10/29/2006 (Approximate)    General appearance: alert, cooperative and appears stated age   Abdomen: incision(s):Yes.  , ____Pfannenstiel_______  soft, non-tender, no masses,  no organomegaly   Pelvic: External genitalia:  no lesions              Urethra:  normal appearing urethra with no masses, tenderness or lesions              Bartholins and Skenes: normal                 Vagina: normal appearing vagina with normal  color and discharge, no lesions              Cervix: absent            Bimanual Exam:  Uterus:  uterus absent              Adnexa: no mass, fullness, tenderness               Chaperone was present for exam.  ASSESSMENT  Abdominal pain.  Possible muscle strain.  No signs of mesh erosion.  Abnormal urine dip.   Depression.  PLAN  Try heating pad or thermal packs.  Increase activity slowly to increase stamina.  Will check urine micro and culture. No abx at this time. I strongly encouraged patient to contact her psychiatrist to discuss her medication.  I also strongly encouraged counseling through psychology office or local church. Follow up prn.    An After Visit Summary was printed and given to the patient.  __25____ minutes face to face time of which over 50% was spent in counseling.

## 2016-04-28 LAB — URINALYSIS, MICROSCOPIC ONLY
BACTERIA UA: NONE SEEN [HPF]
Casts: NONE SEEN [LPF]
Crystals: NONE SEEN [HPF]
SQUAMOUS EPITHELIAL / LPF: NONE SEEN [HPF] (ref <=5)
YEAST: NONE SEEN [HPF]

## 2016-04-29 LAB — URINE CULTURE
COLONY COUNT: NO GROWTH
Organism ID, Bacteria: NO GROWTH

## 2016-05-02 ENCOUNTER — Telehealth: Payer: Self-pay

## 2016-05-02 NOTE — Telephone Encounter (Signed)
-----   Message from Nunzio Cobbs, MD sent at 04/30/2016  4:07 PM EDT ----- Please report negative urine culture to patient.   Cc- Marisa Sprinkles

## 2016-05-02 NOTE — Telephone Encounter (Signed)
Called patient at [phone] to discuss negative urine culture results, voicemail not set up.  Will try again later.

## 2016-05-02 NOTE — Telephone Encounter (Signed)
Patient is returning a call to Amanda.

## 2016-05-02 NOTE — Telephone Encounter (Signed)
Patient notified of negative urine culture.

## 2016-05-02 NOTE — Telephone Encounter (Signed)
Return call to Amanda.  °

## 2016-05-09 ENCOUNTER — Ambulatory Visit (INDEPENDENT_AMBULATORY_CARE_PROVIDER_SITE_OTHER): Payer: Medicare Other | Admitting: Internal Medicine

## 2016-05-09 ENCOUNTER — Encounter: Payer: Self-pay | Admitting: Internal Medicine

## 2016-05-09 VITALS — BP 130/70 | HR 86 | Wt 136.0 lb

## 2016-05-09 DIAGNOSIS — E038 Other specified hypothyroidism: Secondary | ICD-10-CM | POA: Diagnosis not present

## 2016-05-09 DIAGNOSIS — E538 Deficiency of other specified B group vitamins: Secondary | ICD-10-CM | POA: Diagnosis not present

## 2016-05-09 DIAGNOSIS — M5440 Lumbago with sciatica, unspecified side: Secondary | ICD-10-CM

## 2016-05-09 DIAGNOSIS — N813 Complete uterovaginal prolapse: Secondary | ICD-10-CM | POA: Diagnosis not present

## 2016-05-09 DIAGNOSIS — E034 Atrophy of thyroid (acquired): Secondary | ICD-10-CM

## 2016-05-09 DIAGNOSIS — G8929 Other chronic pain: Secondary | ICD-10-CM

## 2016-05-09 MED ORDER — OXYCODONE-ACETAMINOPHEN 5-325 MG PO TABS: 1.0000 | ORAL_TABLET | Freq: Two times a day (BID) | ORAL | Status: DC | PRN

## 2016-05-09 NOTE — Assessment & Plan Note (Signed)
Levothroid 

## 2016-05-09 NOTE — Assessment & Plan Note (Signed)
on Percocet   Potential benefits of a long term opioids use as well as potential risks (i.e. addiction risk, apnea etc) and complications (i.e. Somnolence, constipation and others) were explained to the patient and were aknowledged.  Potential benefits of a long term NSAID  use as well as potential risks  and complications were explained to the patient and were aknowledged.      

## 2016-05-09 NOTE — Assessment & Plan Note (Signed)
S/p repair 2017 - doing well

## 2016-05-09 NOTE — Progress Notes (Signed)
Subjective:  Patient ID: Melody [name], female    DOB: [birth year]  Age: 60 y.o. MRN: [place]:[id number]  CC: No chief complaint on file.   HPI BEAUTIFULL RIESBERG presents for OAB - better, asthma, hypothyroidism, HAs f/u  Outpatient Prescriptions Prior to Visit  Medication Sig Dispense Refill  . albuterol (PROAIR HFA) 108 (90 Base) MCG/ACT inhaler INHALE 2 PUFFS BY MOUTH EVERY FOUR HOURS AS NEEDED 8.5 g 3  . Albuterol Sulfate (PROAIR RESPICLICK) 123XX123 (90 BASE) MCG/ACT AEPB Inhale 1-2 puffs into the lungs 4 (four) times daily as needed. 1 each 11  . ALPRAZolam (XANAX) 1 MG tablet Take 1 mg by mouth 4 (four) times daily.     Marland Kitchen amphetamine-dextroamphetamine (ADDERALL) 30 MG tablet Take 15-30 mg by mouth 2 (two) times daily as needed (For focus.). Takes one tablet in the morning, may take one-half tablet in the afternoon if needed    . cholecalciferol (VITAMIN D) 1000 units tablet Take 1,000 Units by mouth once a week.    . cyanocobalamin (COBAL-1000) 1000 MCG/ML injection Inject 1 mL (1,000 mcg total) into the muscle every 14 (fourteen) days. 10 mL 11  . eletriptan (RELPAX) 20 MG tablet Take 20 mg by mouth every 2 (two) hours as needed for migraine. may repeat in 2 hours if necessary    . fluticasone (FLONASE) 50 MCG/ACT nasal spray USE 2 SPRAYS IN BOTH NOSTRILS DAILY. 16 g 11  . levocetirizine (XYZAL) 5 MG tablet Take 1 tablet (5 mg total) by mouth every evening. 30 tablet 5  . levothyroxine (SYNTHROID, LEVOTHROID) 50 MCG tablet Take 1 tablet (50 mcg total) by mouth daily before breakfast. 30 tablet 11  . oxyCODONE-acetaminophen (PERCOCET/ROXICET) 5-325 MG tablet Take 1 tablet by mouth 2 (two) times daily as needed for severe pain. 60 tablet 0  . pantoprazole (PROTONIX) 40 MG tablet Take 40 mg by mouth daily.    . QUEtiapine (SEROQUEL) 25 MG tablet Take 25 mg by mouth at bedtime.     . triamcinolone cream (KENALOG) 0.5 % Apply 1 application topically 3 (three) times daily. 60 g 3   No  facility-administered medications prior to visit.    ROS Review of Systems  Constitutional: Positive for fatigue. Negative for chills, activity change, appetite change and unexpected weight change.  HENT: Negative for congestion, mouth sores and sinus pressure.   Eyes: Negative for visual disturbance.  Respiratory: Negative for cough and chest tightness.   Gastrointestinal: Negative for nausea and abdominal pain.  Endocrine: Negative for polyphagia.  Genitourinary: Negative for dysuria, urgency, frequency, difficulty urinating and vaginal pain.  Musculoskeletal: Negative for back pain and gait problem.  Skin: Negative for pallor and rash.  Neurological: Negative for dizziness, tremors, weakness, numbness and headaches.  Psychiatric/Behavioral: Negative for confusion and sleep disturbance.    Objective:  BP 130/70 mmHg  Pulse 86  Wt 136 lb (61.689 kg)  SpO2 97%  LMP 10/29/2006 (Approximate)  BP Readings from Last 3 Encounters:  05/09/16 130/70  04/27/16 122/76  04/04/16 120/80    Wt Readings from Last 3 Encounters:  05/09/16 136 lb (61.689 kg)  04/27/16 136 lb (61.689 kg)  04/04/16 138 lb 6.4 oz (62.778 kg)    Physical Exam  Constitutional: She appears well-developed. No distress.  HENT:  Head: Normocephalic.  Right Ear: External ear normal.  Left Ear: External ear normal.  Nose: Nose normal.  Mouth/Throat: Oropharynx is clear and moist.  Eyes: Conjunctivae are normal. Pupils are equal, round, and reactive to  Pinson. Right eye exhibits no discharge. Left eye exhibits no discharge.  Neck: Normal range of motion. Neck supple. No JVD present. No tracheal deviation present. No thyromegaly present.  Cardiovascular: Normal rate, regular rhythm and normal heart sounds.   Pulmonary/Chest: No stridor. No respiratory distress. She has no wheezes.  Abdominal: Soft. Bowel sounds are normal. She exhibits no distension and no mass. There is no tenderness. There is no rebound and no  guarding.  Musculoskeletal: She exhibits no edema or tenderness.  Lymphadenopathy:    She has no cervical adenopathy.  Neurological: She displays normal reflexes. No cranial nerve deficit. She exhibits normal muscle tone. Coordination normal.  Skin: No rash noted. No erythema.  Psychiatric: She has a normal mood and affect. Her behavior is normal. Judgment and thought content normal.    Lab Results  Component Value Date   WBC 6.4 02/08/2016   HGB 13.2 02/08/2016   HCT 39.1 02/08/2016   PLT 172.0 02/08/2016   GLUCOSE 87 02/08/2016   CHOL 186 09/03/2014   TRIG 64.0 09/03/2014   HDL 55.00 09/03/2014   LDLCALC 118* 09/03/2014   ALT 10 02/08/2016   AST 13 02/08/2016   NA 139 02/08/2016   K 4.2 02/08/2016   CL 102 02/08/2016   CREATININE 0.86 02/08/2016   BUN 13 02/08/2016   CO2 31 02/08/2016   TSH 1.90 02/08/2016    Mm Digital Diagnostic Unilat R  01/06/2016  CLINICAL DATA:  Screening recall for right breast calcifications. EXAM: DIGITAL DIAGNOSTIC RIGHT MAMMOGRAM COMPARISON:  Previous exam(s). ACR Breast Density Category c: The breast tissue is heterogeneously dense, which may obscure small masses. FINDINGS: Small group of calcifications in the right breast absent generally punctate or coarse, most likely dystrophic associated mass or distortion. No linearity or branching. IMPRESSION: Probably benign right breast calcifications. Short-term follow-up recommended. RECOMMENDATION: Diagnostic right breast mammography with magnification views in 6 months. I have discussed the findings and recommendations with the patient. Results were also provided in writing at the conclusion of the visit. If applicable, a reminder letter will be sent to the patient regarding the next appointment. BI-RADS CATEGORY  3: Probably benign. Electronically Signed   By: Lajean Manes M.D.   On: 01/06/2016 14:54    Assessment & Plan:   There are no diagnoses linked to this encounter. I am having Ms. Mizell  maintain her eletriptan, QUEtiapine, ALPRAZolam, amphetamine-dextroamphetamine, Albuterol Sulfate, pantoprazole, cholecalciferol, fluticasone, levocetirizine, levothyroxine, triamcinolone cream, oxyCODONE-acetaminophen, cyanocobalamin, and albuterol.  No orders of the defined types were placed in this encounter.     Follow-up: No Follow-up on file.  Walker Kehr, MD

## 2016-05-09 NOTE — Progress Notes (Signed)
Pre visit review using our clinic review tool, if applicable. No additional management support is needed unless otherwise documented below in the visit note. 

## 2016-05-09 NOTE — Assessment & Plan Note (Signed)
On B12 

## 2016-07-11 ENCOUNTER — Telehealth: Payer: Self-pay | Admitting: Internal Medicine

## 2016-07-11 NOTE — Telephone Encounter (Signed)
Pt stating she has sinus pressure and throat swollen. She was wondering if Dr. Camila Li can send abx to [facility]. Please advise.

## 2016-07-11 NOTE — Telephone Encounter (Signed)
OK Zpac Thx 

## 2016-07-12 MED ORDER — AZITHROMYCIN 250 MG PO TABS: ORAL_TABLET | ORAL | 0 refills | Status: DC

## 2016-07-12 NOTE — Telephone Encounter (Signed)
Notified pt MD ok z-pac verified pharmacy sent to [facility]...Melody [name]

## 2016-07-23 ENCOUNTER — Other Ambulatory Visit: Payer: Self-pay | Admitting: Internal Medicine

## 2016-07-31 ENCOUNTER — Telehealth: Payer: Self-pay | Admitting: *Deleted

## 2016-07-31 NOTE — Telephone Encounter (Signed)
Spoke with patient about scheduling her Dx R Breast mammo. Pt states she received a letter from [facility] and will call them to schedule. I asked patient to call us back when she does and let us know when so we will be looking for the results. -sco

## 2016-07-31 NOTE — Telephone Encounter (Signed)
Patient is on 04 recall for 06/2016 for DX right breast Mammogram. Please contact patient and schedule. Thanks Margaretha Sheffield

## 2016-08-15 ENCOUNTER — Ambulatory Visit: Payer: Medicare Other | Admitting: Internal Medicine

## 2016-08-17 ENCOUNTER — Ambulatory Visit (INDEPENDENT_AMBULATORY_CARE_PROVIDER_SITE_OTHER): Payer: Medicare Other | Admitting: Internal Medicine

## 2016-08-17 ENCOUNTER — Other Ambulatory Visit (INDEPENDENT_AMBULATORY_CARE_PROVIDER_SITE_OTHER): Payer: Medicare Other

## 2016-08-17 ENCOUNTER — Encounter: Payer: Self-pay | Admitting: Internal Medicine

## 2016-08-17 VITALS — BP 130/80 | HR 71 | Wt 140.0 lb

## 2016-08-17 DIAGNOSIS — F411 Generalized anxiety disorder: Secondary | ICD-10-CM

## 2016-08-17 DIAGNOSIS — E559 Vitamin D deficiency, unspecified: Secondary | ICD-10-CM

## 2016-08-17 DIAGNOSIS — M5440 Lumbago with sciatica, unspecified side: Secondary | ICD-10-CM | POA: Diagnosis not present

## 2016-08-17 DIAGNOSIS — F319 Bipolar disorder, unspecified: Secondary | ICD-10-CM | POA: Diagnosis not present

## 2016-08-17 DIAGNOSIS — G8929 Other chronic pain: Secondary | ICD-10-CM

## 2016-08-17 DIAGNOSIS — E538 Deficiency of other specified B group vitamins: Secondary | ICD-10-CM

## 2016-08-17 LAB — CBC WITH DIFFERENTIAL/PLATELET
BASOS ABS: 0 10*3/uL (ref 0.0–0.1)
BASOS PCT: 0.8 % (ref 0.0–3.0)
EOS ABS: 0.1 10*3/uL (ref 0.0–0.7)
Eosinophils Relative: 1.8 % (ref 0.0–5.0)
HEMATOCRIT: 42.3 % (ref 36.0–46.0)
HEMOGLOBIN: 14.3 g/dL (ref 12.0–15.0)
LYMPHS PCT: 23.2 % (ref 12.0–46.0)
Lymphs Abs: 1.5 10*3/uL (ref 0.7–4.0)
MCHC: 33.8 g/dL (ref 30.0–36.0)
MCV: 85.3 fl (ref 78.0–100.0)
MONO ABS: 0.4 10*3/uL (ref 0.1–1.0)
Monocytes Relative: 6.4 % (ref 3.0–12.0)
Neutro Abs: 4.4 10*3/uL (ref 1.4–7.7)
Neutrophils Relative %: 67.8 % (ref 43.0–77.0)
Platelets: 191 10*3/uL (ref 150.0–400.0)
RBC: 4.95 Mil/uL (ref 3.87–5.11)
RDW: 14 % (ref 11.5–15.5)
WBC: 6.6 10*3/uL (ref 4.0–10.5)

## 2016-08-17 LAB — SEDIMENTATION RATE: SED RATE: 5 mm/h (ref 0–30)

## 2016-08-17 LAB — TSH: TSH: 3.19 u[IU]/mL (ref 0.35–4.50)

## 2016-08-17 MED ORDER — OXYCODONE-ACETAMINOPHEN 5-325 MG PO TABS: 1.0000 | ORAL_TABLET | Freq: Two times a day (BID) | ORAL | 0 refills | Status: DC | PRN

## 2016-08-17 NOTE — Progress Notes (Signed)
Subjective:  Patient ID: Melody [name], female    DOB: 05-Sep-1956  Age: 60 y.o. MRN: [id number]  CC: No chief complaint on file.   HPI Melody [name] presents for LBP, anxiety, HAs. C/o arthralgias everywhere. C/o L ankle pain. C/o stiffness in am  Outpatient Medications Prior to Visit  Medication Sig Dispense Refill  . albuterol (PROAIR HFA) 108 (90 Base) MCG/ACT inhaler INHALE 2 PUFFS BY MOUTH EVERY FOUR HOURS AS NEEDED 8.5 g 3  . Albuterol Sulfate (PROAIR RESPICLICK) 123XX123 (90 BASE) MCG/ACT AEPB Inhale 1-2 puffs into the lungs 4 (four) times daily as needed. 1 each 11  . ALPRAZolam (XANAX) 1 MG tablet Take 1 mg by mouth 4 (four) times daily.     Marland Kitchen amphetamine-dextroamphetamine (ADDERALL) 30 MG tablet Take 15-30 mg by mouth 2 (two) times daily as needed (For focus.). Takes one tablet in the morning, may take one-half tablet in the afternoon if needed    . azithromycin (ZITHROMAX) 250 MG tablet First day take 2 tablet at once then 1 tablet for the next 4 days 6 each 0  . cholecalciferol (VITAMIN D) 1000 units tablet Take 1,000 Units by mouth once a week.    . cyanocobalamin (COBAL-1000) 1000 MCG/ML injection Inject 1 mL (1,000 mcg total) into the muscle every 14 (fourteen) days. 10 mL 11  . eletriptan (RELPAX) 20 MG tablet Take 20 mg by mouth every 2 (two) hours as needed for migraine. may repeat in 2 hours if necessary    . fluticasone (FLONASE) 50 MCG/ACT nasal spray USE 2 SPRAYS IN BOTH NOSTRILS DAILY. 16 g 11  . levocetirizine (XYZAL) 5 MG tablet TAKE 1 TABLET BY MOUTH EVERY EVENING. 30 tablet 5  . levothyroxine (SYNTHROID, LEVOTHROID) 50 MCG tablet Take 1 tablet (50 mcg total) by mouth daily before breakfast. 30 tablet 11  . oxyCODONE-acetaminophen (PERCOCET/ROXICET) 5-325 MG tablet Take 1 tablet by mouth 2 (two) times daily as needed for severe pain. 60 tablet 0  . pantoprazole (PROTONIX) 40 MG tablet Take 40 mg by mouth daily.    . QUEtiapine (SEROQUEL) 25 MG tablet Take 25 mg by  mouth at bedtime.     . triamcinolone cream (KENALOG) 0.5 % Apply 1 application topically 3 (three) times daily. 60 g 3   No facility-administered medications prior to visit.     ROS Review of Systems  Constitutional: Positive for fatigue. Negative for activity change, appetite change, chills and unexpected weight change.  HENT: Negative for congestion, mouth sores and sinus pressure.   Eyes: Negative for visual disturbance.  Respiratory: Negative for cough and chest tightness.   Gastrointestinal: Negative for abdominal pain and nausea.  Genitourinary: Negative for difficulty urinating, frequency and vaginal pain.  Musculoskeletal: Positive for arthralgias, back pain, joint swelling and myalgias. Negative for gait problem.  Skin: Negative for color change, pallor and rash.  Neurological: Negative for dizziness, tremors, weakness, numbness and headaches.  Hematological: Does not bruise/bleed easily.  Psychiatric/Behavioral: Positive for decreased concentration. Negative for confusion and sleep disturbance. The patient is nervous/anxious.     Objective:  BP 130/80   Pulse 71   Wt 140 lb (63.5 kg)   LMP 10/29/2006 (Approximate)   SpO2 98%   BMI 24.03 kg/m   BP Readings from Last 3 Encounters:  08/17/16 130/80  05/09/16 130/70  04/27/16 122/76    Wt Readings from Last 3 Encounters:  08/17/16 140 lb (63.5 kg)  05/09/16 136 lb (61.7 kg)  04/27/16 136 lb (61.7  kg)    Physical Exam  Constitutional: She appears well-developed. No distress.  HENT:  Head: Normocephalic.  Right Ear: External ear normal.  Left Ear: External ear normal.  Nose: Nose normal.  Mouth/Throat: Oropharynx is clear and moist.  Eyes: Conjunctivae are normal. Pupils are equal, round, and reactive to Ozer. Right eye exhibits no discharge. Left eye exhibits no discharge.  Neck: Normal range of motion. Neck supple. No JVD present. No tracheal deviation present. No thyromegaly present.  Cardiovascular:  Normal rate, regular rhythm and normal heart sounds.   Pulmonary/Chest: No stridor. No respiratory distress. She has no wheezes.  Abdominal: Soft. Bowel sounds are normal. She exhibits no distension and no mass. There is no tenderness. There is no rebound and no guarding.  Musculoskeletal: She exhibits tenderness. She exhibits no edema.  Lymphadenopathy:    She has no cervical adenopathy.  Neurological: She displays normal reflexes. No cranial nerve deficit. She exhibits normal muscle tone. Coordination normal.  Skin: No rash noted. No erythema.  Psychiatric: She has a normal mood and affect. Her behavior is normal. Judgment and thought content normal.  R medial malleolus is tender  Lab Results  Component Value Date   WBC 6.4 02/08/2016   HGB 13.2 02/08/2016   HCT 39.1 02/08/2016   PLT 172.0 02/08/2016   GLUCOSE 87 02/08/2016   CHOL 186 09/03/2014   TRIG 64.0 09/03/2014   HDL 55.00 09/03/2014   LDLCALC 118 (H) 09/03/2014   ALT 10 02/08/2016   AST 13 02/08/2016   NA 139 02/08/2016   K 4.2 02/08/2016   CL 102 02/08/2016   CREATININE 0.86 02/08/2016   BUN 13 02/08/2016   CO2 31 02/08/2016   TSH 1.90 02/08/2016    Mm Digital Diagnostic Unilat R  Result Date: 01/06/2016 CLINICAL DATA:  Screening recall for right breast calcifications. EXAM: DIGITAL DIAGNOSTIC RIGHT MAMMOGRAM COMPARISON:  Previous exam(s). ACR Breast Density Category c: The breast tissue is heterogeneously dense, which may obscure small masses. FINDINGS: Small group of calcifications in the right breast absent generally punctate or coarse, most likely dystrophic associated mass or distortion. No linearity or branching. IMPRESSION: Probably benign right breast calcifications. Short-term follow-up recommended. RECOMMENDATION: Diagnostic right breast mammography with magnification views in 6 months. I have discussed the findings and recommendations with the patient. Results were also provided in writing at the conclusion of  the visit. If applicable, a reminder letter will be sent to the patient regarding the next appointment. BI-RADS CATEGORY  3: Probably benign. Electronically Signed   By: Lajean Manes M.D.   On: 01/06/2016 14:54    Assessment & Plan:   There are no diagnoses linked to this encounter. I am having Ms. Avants maintain her eletriptan, QUEtiapine, ALPRAZolam, amphetamine-dextroamphetamine, Albuterol Sulfate, pantoprazole, cholecalciferol, fluticasone, levothyroxine, triamcinolone cream, cyanocobalamin, albuterol, oxyCODONE-acetaminophen, azithromycin, and levocetirizine.  No orders of the defined types were placed in this encounter.    Follow-up: No Follow-up on file.  Walker Kehr, MD

## 2016-08-17 NOTE — Assessment & Plan Note (Signed)
On B12 

## 2016-08-17 NOTE — Assessment & Plan Note (Signed)
Potential benefits of a long term opioids use as well as potential risks (i.e. addiction risk, apnea etc) and complications (i.e. Somnolence, constipation and others) were explained to the patient and were aknowledged.  Potential benefits of a long term NSAID  use as well as potential risks  and complications were explained to the patient and were aknowledged.

## 2016-08-17 NOTE — Progress Notes (Signed)
Pre visit review using our clinic review tool, if applicable. No additional management support is needed unless otherwise documented below in the visit note. 

## 2016-08-17 NOTE — Assessment & Plan Note (Signed)
Dr [name] Seroquel, Xanax prn

## 2016-08-17 NOTE — Assessment & Plan Note (Signed)
On Vit D 

## 2016-08-17 NOTE — Assessment & Plan Note (Signed)
Potential benefits of a long term benzodiazepines  use as well as potential risks  and complications were explained to the patient and were aknowledged. Dr [name] Seroquel, Xanax prn

## 2016-08-19 LAB — RHEUMATOID FACTOR

## 2016-08-20 LAB — ANA: Anti Nuclear Antibody(ANA): NEGATIVE

## 2016-08-20 NOTE — Telephone Encounter (Signed)
Called patient to remind her to schedule her follow up Rt.Diag.mammogram,unable to leave message because voicemail not set up--will try back later.

## 2016-09-10 ENCOUNTER — Telehealth: Payer: Self-pay | Admitting: Emergency Medicine

## 2016-09-10 NOTE — Telephone Encounter (Signed)
Pt called and stated she is feeling weak with no energy. She is wondering if she needs to take iron tablets? Please advise thanks.

## 2016-09-11 NOTE — Telephone Encounter (Signed)
No. Her blood count was ok OV if issues Thx

## 2016-09-11 NOTE — Telephone Encounter (Signed)
Pt informed

## 2016-09-14 NOTE — Telephone Encounter (Signed)
Called patient at [phone] to schedule her follow up Rt.Diag.mammogram. Unable to leave message. Called [facility] and verified they had sent patient a letter on 07-24-16 requesting her to call and schedule Rt.Diag.mammogram but patient has not responded. Routed to Dr.Silva

## 2016-09-23 NOTE — Telephone Encounter (Signed)
Patient needs our official mammogram follow up letter sent.  Please print this for me to sign.   Cc- Starlyn Skeans

## 2016-09-24 ENCOUNTER — Encounter: Payer: Self-pay | Admitting: Internal Medicine

## 2016-09-24 ENCOUNTER — Encounter: Payer: Self-pay | Admitting: *Deleted

## 2016-09-24 NOTE — Telephone Encounter (Signed)
Letter printed -eh

## 2016-10-08 ENCOUNTER — Ambulatory Visit: Payer: Medicare Other | Admitting: Internal Medicine

## 2016-10-23 ENCOUNTER — Telehealth: Payer: Self-pay | Admitting: Internal Medicine

## 2016-10-23 NOTE — Telephone Encounter (Signed)
Patient states that she has never had to come in the office to get an antibiotic for a sinus infection.  She is requesting Dr. Camila Li to call her in an antibiotic once he comes back in.

## 2016-10-25 NOTE — Telephone Encounter (Signed)
OK Zpac Thx 

## 2016-10-26 ENCOUNTER — Telehealth: Payer: Self-pay

## 2016-10-26 MED ORDER — AZITHROMYCIN 250 MG PO TABS: ORAL_TABLET | ORAL | 0 refills | Status: DC

## 2016-10-26 NOTE — Telephone Encounter (Signed)
error 

## 2016-10-26 NOTE — Telephone Encounter (Signed)
zpak rx sent to [facility]---tried to contact patient, no answer, no way to leave message, voice mailbox not set up---if patient calls back, can pick rx up --can talk with Alec Mcphee if any questions

## 2016-10-26 NOTE — Telephone Encounter (Signed)
Melody [name], can you please send and follow up with patient?

## 2016-11-20 ENCOUNTER — Encounter: Payer: Self-pay | Admitting: Internal Medicine

## 2016-11-20 ENCOUNTER — Ambulatory Visit (INDEPENDENT_AMBULATORY_CARE_PROVIDER_SITE_OTHER): Payer: Medicare Other | Admitting: Internal Medicine

## 2016-11-20 DIAGNOSIS — M5440 Lumbago with sciatica, unspecified side: Secondary | ICD-10-CM | POA: Diagnosis not present

## 2016-11-20 DIAGNOSIS — R21 Rash and other nonspecific skin eruption: Secondary | ICD-10-CM

## 2016-11-20 DIAGNOSIS — E559 Vitamin D deficiency, unspecified: Secondary | ICD-10-CM

## 2016-11-20 DIAGNOSIS — E538 Deficiency of other specified B group vitamins: Secondary | ICD-10-CM | POA: Diagnosis not present

## 2016-11-20 DIAGNOSIS — G8929 Other chronic pain: Secondary | ICD-10-CM

## 2016-11-20 MED ORDER — OXYCODONE-ACETAMINOPHEN 5-325 MG PO TABS: 1.0000 | ORAL_TABLET | Freq: Two times a day (BID) | ORAL | 0 refills | Status: DC | PRN

## 2016-11-20 MED ORDER — DIPHENHYDRAMINE HCL 2 % EX CREA: TOPICAL_CREAM | Freq: Three times a day (TID) | CUTANEOUS | 2 refills | Status: DC | PRN

## 2016-11-20 MED ORDER — TRIAMCINOLONE ACETONIDE 0.1 % EX OINT: 1.0000 "application " | TOPICAL_OINTMENT | Freq: Two times a day (BID) | CUTANEOUS | 3 refills | Status: DC

## 2016-11-20 NOTE — Progress Notes (Signed)
Pre visit review using our clinic review tool, if applicable. No additional management support is needed unless otherwise documented below in the visit note. 

## 2016-11-20 NOTE — Assessment & Plan Note (Signed)
Percocet

## 2016-11-20 NOTE — Progress Notes (Signed)
Subjective:  Patient ID: Melody [name], female    DOB: 12-14-1955  Age: 61 y.o. MRN: [id number]  CC: No chief complaint on file.   HPI Melody [name] presents for L arm pain, rash on back x years F/u LBP C/o recent sinusitis w/dizziness. She is finished w/the abx  Outpatient Medications Prior to Visit  Medication Sig Dispense Refill  . albuterol (PROAIR HFA) 108 (90 Base) MCG/ACT inhaler INHALE 2 PUFFS BY MOUTH EVERY FOUR HOURS AS NEEDED 8.5 g 3  . Albuterol Sulfate (PROAIR RESPICLICK) 123XX123 (90 BASE) MCG/ACT AEPB Inhale 1-2 puffs into the lungs 4 (four) times daily as needed. 1 each 11  . ALPRAZolam (XANAX) 1 MG tablet Take 1 mg by mouth 4 (four) times daily.     Marland Kitchen amphetamine-dextroamphetamine (ADDERALL) 30 MG tablet Take 15-30 mg by mouth 2 (two) times daily as needed (For focus.). Takes one tablet in the morning, may take one-half tablet in the afternoon if needed    . cholecalciferol (VITAMIN D) 1000 units tablet Take 1,000 Units by mouth once a week.    . cyanocobalamin (COBAL-1000) 1000 MCG/ML injection Inject 1 mL (1,000 mcg total) into the muscle every 14 (fourteen) days. 10 mL 11  . eletriptan (RELPAX) 20 MG tablet Take 20 mg by mouth every 2 (two) hours as needed for migraine. may repeat in 2 hours if necessary    . fluticasone (FLONASE) 50 MCG/ACT nasal spray USE 2 SPRAYS IN BOTH NOSTRILS DAILY. 16 g 11  . levocetirizine (XYZAL) 5 MG tablet TAKE 1 TABLET BY MOUTH EVERY EVENING. 30 tablet 5  . levothyroxine (SYNTHROID, LEVOTHROID) 50 MCG tablet Take 1 tablet (50 mcg total) by mouth daily before breakfast. 30 tablet 11  . oxyCODONE-acetaminophen (PERCOCET/ROXICET) 5-325 MG tablet Take 1 tablet by mouth 2 (two) times daily as needed for severe pain. 60 tablet 0  . pantoprazole (PROTONIX) 40 MG tablet Take 40 mg by mouth daily.    . QUEtiapine (SEROQUEL) 25 MG tablet Take 25 mg by mouth at bedtime.     . triamcinolone cream (KENALOG) 0.5 % Apply 1 application topically 3 (three)  times daily. 60 g 3  . azithromycin (ZITHROMAX) 250 MG tablet Take 2 tablets first day, then one tablet daily until finished---take with food 6 tablet 0   No facility-administered medications prior to visit.     ROS Review of Systems  Constitutional: Positive for fatigue. Negative for activity change, appetite change, chills and unexpected weight change.  HENT: Positive for congestion, rhinorrhea and sinus pain. Negative for mouth sores and sinus pressure.   Eyes: Negative for visual disturbance.  Respiratory: Positive for cough. Negative for chest tightness.   Gastrointestinal: Negative for abdominal pain and nausea.  Genitourinary: Negative for difficulty urinating, frequency and vaginal pain.  Musculoskeletal: Positive for arthralgias and back pain. Negative for gait problem.  Skin: Positive for rash. Negative for pallor.  Neurological: Positive for dizziness, weakness and headaches. Negative for tremors and numbness.  Hematological: Does not bruise/bleed easily.  Psychiatric/Behavioral: Positive for decreased concentration and sleep disturbance. Negative for agitation and confusion. The patient is nervous/anxious.     Objective:  BP 120/78   Pulse 70   Temp 98 F (36.7 C) (Oral)   Wt 143 lb (64.9 kg)   LMP 10/29/2006 (Approximate)   SpO2 95%   BMI 24.55 kg/m   BP Readings from Last 3 Encounters:  11/20/16 120/78  08/17/16 130/80  05/09/16 130/70    Wt Readings from Last  3 Encounters:  11/20/16 143 lb (64.9 kg)  08/17/16 140 lb (63.5 kg)  05/09/16 136 lb (61.7 kg)    Physical Exam  Constitutional: She appears well-developed. No distress.  HENT:  Head: Normocephalic.  Right Ear: External ear normal.  Left Ear: External ear normal.  Nose: Nose normal.  Mouth/Throat: Oropharynx is clear and moist.  Eyes: Conjunctivae are normal. Pupils are equal, round, and reactive to Helbling. Right eye exhibits no discharge. Left eye exhibits no discharge.  Neck: Normal range of  motion. Neck supple. No JVD present. No tracheal deviation present. No thyromegaly present.  Cardiovascular: Normal rate, regular rhythm and normal heart sounds.   Pulmonary/Chest: No stridor. No respiratory distress. She has no wheezes.  Abdominal: Soft. Bowel sounds are normal. She exhibits no distension and no mass. There is no tenderness. There is no rebound and no guarding.  Musculoskeletal: She exhibits no edema or tenderness.  Lymphadenopathy:    She has no cervical adenopathy.  Neurological: She displays normal reflexes. No cranial nerve deficit. She exhibits normal muscle tone. Coordination normal.  Skin: No rash noted. No erythema.  Psychiatric: She has a normal mood and affect. Her behavior is normal. Judgment and thought content normal.  swollen nasal mucosa Looks tired Eczema patch in L back 8x4 cm  Lab Results  Component Value Date   WBC 6.6 08/17/2016   HGB 14.3 08/17/2016   HCT 42.3 08/17/2016   PLT 191.0 08/17/2016   GLUCOSE 87 02/08/2016   CHOL 186 09/03/2014   TRIG 64.0 09/03/2014   HDL 55.00 09/03/2014   LDLCALC 118 (H) 09/03/2014   ALT 10 02/08/2016   AST 13 02/08/2016   NA 139 02/08/2016   K 4.2 02/08/2016   CL 102 02/08/2016   CREATININE 0.86 02/08/2016   BUN 13 02/08/2016   CO2 31 02/08/2016   TSH 3.19 08/17/2016    Mm Digital Diagnostic Unilat R  Result Date: 01/06/2016 CLINICAL DATA:  Screening recall for right breast calcifications. EXAM: DIGITAL DIAGNOSTIC RIGHT MAMMOGRAM COMPARISON:  Previous exam(s). ACR Breast Density Category c: The breast tissue is heterogeneously dense, which may obscure small masses. FINDINGS: Small group of calcifications in the right breast absent generally punctate or coarse, most likely dystrophic associated mass or distortion. No linearity or branching. IMPRESSION: Probably benign right breast calcifications. Short-term follow-up recommended. RECOMMENDATION: Diagnostic right breast mammography with magnification views in 6  months. I have discussed the findings and recommendations with the patient. Results were also provided in writing at the conclusion of the visit. If applicable, a reminder letter will be sent to the patient regarding the next appointment. BI-RADS CATEGORY  3: Probably benign. Electronically Signed   By: Lajean Manes M.D.   On: 01/06/2016 14:54    Assessment & Plan:   There are no diagnoses linked to this encounter. I have discontinued Ms. Benavidez's azithromycin. I am also having her maintain her eletriptan, QUEtiapine, ALPRAZolam, amphetamine-dextroamphetamine, Albuterol Sulfate, pantoprazole, cholecalciferol, fluticasone, levothyroxine, triamcinolone cream, cyanocobalamin, albuterol, levocetirizine, and oxyCODONE-acetaminophen.  No orders of the defined types were placed in this encounter.    Follow-up: No Follow-up on file.  Walker Kehr, MD

## 2016-11-20 NOTE — Patient Instructions (Signed)
What Is [place] Restaurant Syndrome? [place] restaurant syndrome is an outdated term that was first coined in the 1960s. It refers to a group of symptoms that some people experience after eating food from a [employer]. These symptoms often include a headache, skin flushing, and sweating. A food additive called monosodium glutamate (MSG) is often blamed for the symptoms some people experience after eating [place] food. However, there's minimal scientific evidence showing a link between MSG and these symptoms in humans.  The U.S. Food and Drug Administration (FDA) considers MSG to be a safe ingredient, and most people can eat foods that contain MSG without experiencing any problems. However, a small percentage of people have short-term adverse reactions to the food additive. Due to the controversy, many restaurants now advertise that they don't add MSG to their foods. Monosodium Glutamate  What Is Monosodium Glutamate (MSG)? MSG is a food additive that's used to improve the taste of food. MSG has become an important additive for the food industry because it often allows the use of lower quality or less fresh ingredients without compromising reported flavor. MSG is similar to glutamate, a substance that's found naturally in almost all foods. MSG is produced by the fermentation of molasses, starch, or sugar cane. This fermentation process is similar to process used to make wine and yogurt. The FDA categorizes MSG as a food additive that's generally recognized as safe (GRAS). The FDA also categorizes salt and sugar as GRAS.  There's controversy over the lack of oversight the FDA has in the introduction and use of additives by the food industry. According to the Center for Science in the Public Interest (CSPI), many food additives declared as GRAS don't go through rigorous testing for such a safety claim. Trans fats at one time were identified as GRAS until enough research forced the FDA to step in and  change the classification. Aside from being used in some [place] food, MSG is added to many processed foods, including hot dogs and potato chips. Since people identify themselves as sensitive to MSG, the FDA requires companies that add MSG to their foods to include the additive on the list of ingredients on the package label.  Symptoms  What Are the Symptoms of [place] Restaurant Syndrome? People may experience symptoms within two hours after eating foods that contain MSG. They can last anywhere from a few hours to a couple of days. Common symptoms include: headache  sweating  skin flushing  numbness or burning in the mouth  numbness or burning in the throat  nausea  fatigue Less commonly, people experience severe, potentially life-threatening symptoms that are similar to those of allergic reactions. Severe symptoms may include: chest pain  a rapid heartbeat  an abnormal heartbeat  difficulty breathing  swelling in the face  swelling in the throat Minor symptoms don't require treatment. You should go to an emergency room or call 911 right away if you experience severe symptoms. Causes  What Causes [place] Restaurant Syndrome? MSG is thought to be linked to these symptoms, but it's not proven. If you become ill after eating [place] food or other foods that contain MSG, you may be sensitive to the food additive. It's also possible to be sensitive to foods that naturally contain high amounts of glutamate. Diagnosis  How Is [place] Restaurant Syndrome Diagnosed? Your doctor will evaluate your symptoms and dietary intake to determine whether you're sensitive to foods containing MSG. If you're experiencing severe symptoms, such as chest pain or difficulty breathing, your doctor  may check your heart rate, perform an electrocardiogram to analyze your heart rhythm, and check your airway to see if it's blocked. Treatments  How Is [place] Restaurant Syndrome Treated? Treatment may vary depending on  the type of symptoms you're experiencing. Treatment for Common Symptoms Mild symptoms usually don't require treatment. Taking over-the-counter pain relievers may ease your headache. Drinking several glasses of water may help to flush the MSG out of your system and shorten the duration of your symptoms. Treatment for Severe Symptoms Your doctor may prescribe antihistamine medications to relieve any severe symptoms you may be experiencing, such as difficulty breathing, swelling of the throat, or a rapid heartbeat.

## 2016-11-20 NOTE — Assessment & Plan Note (Signed)
Vit D 

## 2016-11-20 NOTE — Assessment & Plan Note (Signed)
Eczema on L thor back

## 2016-11-20 NOTE — Assessment & Plan Note (Signed)
On B12 

## 2016-11-26 ENCOUNTER — Telehealth: Payer: Self-pay | Admitting: Internal Medicine

## 2016-11-26 NOTE — Telephone Encounter (Signed)
Pt called stating Dr. Camila Li gave her z-pack about 3 wks ago for headache,congestion,cold. Pt stating she still has symptoms of those(z-pak didn't work), she was wondering if Dr. Camila Li can give her penicillin? What Dr. Camila Li advise.--pt use [facility] for pharmacy.

## 2016-11-27 NOTE — Telephone Encounter (Signed)
Use Flonase and Loratidine OTC daily Thx

## 2016-11-27 NOTE — Telephone Encounter (Signed)
Spoke with pt, gave Dr. Camila Li response. Pt aware.

## 2016-11-29 NOTE — Telephone Encounter (Signed)
Pt called in and said that she is still not feeling better.  She wants antibiotic to knock this out.  She is coughing  up green mucus and her head is hurting.  She said that she has not been able to get out of bed

## 2016-11-30 ENCOUNTER — Encounter (HOSPITAL_COMMUNITY): Payer: Self-pay

## 2016-11-30 ENCOUNTER — Telehealth: Payer: Self-pay | Admitting: Internal Medicine

## 2016-11-30 ENCOUNTER — Emergency Department (HOSPITAL_COMMUNITY): Payer: Medicare Other

## 2016-11-30 ENCOUNTER — Emergency Department (HOSPITAL_COMMUNITY)
Admission: EM | Admit: 2016-11-30 | Discharge: 2016-11-30 | Disposition: A | Discharge status: Home / Self Care | Payer: Medicare Other | Attending: Emergency Medicine | Admitting: Emergency Medicine

## 2016-11-30 DIAGNOSIS — Z79899 Other long term (current) drug therapy: Secondary | ICD-10-CM | POA: Diagnosis not present

## 2016-11-30 DIAGNOSIS — R197 Diarrhea, unspecified: Secondary | ICD-10-CM | POA: Insufficient documentation

## 2016-11-30 DIAGNOSIS — E876 Hypokalemia: Secondary | ICD-10-CM | POA: Diagnosis not present

## 2016-11-30 DIAGNOSIS — Z87891 Personal history of nicotine dependence: Secondary | ICD-10-CM | POA: Insufficient documentation

## 2016-11-30 DIAGNOSIS — E039 Hypothyroidism, unspecified: Secondary | ICD-10-CM | POA: Insufficient documentation

## 2016-11-30 DIAGNOSIS — J189 Pneumonia, unspecified organism: Secondary | ICD-10-CM | POA: Diagnosis not present

## 2016-11-30 DIAGNOSIS — R05 Cough: Secondary | ICD-10-CM | POA: Diagnosis not present

## 2016-11-30 LAB — COMPREHENSIVE METABOLIC PANEL
ALK PHOS: 65 U/L (ref 38–126)
ALT: 15 U/L (ref 14–54)
AST: 18 U/L (ref 15–41)
Albumin: 3.8 g/dL (ref 3.5–5.0)
Anion gap: 8 (ref 5–15)
BUN: 10 mg/dL (ref 6–20)
CALCIUM: 8.9 mg/dL (ref 8.9–10.3)
CHLORIDE: 101 mmol/L (ref 101–111)
CO2: 25 mmol/L (ref 22–32)
CREATININE: 0.82 mg/dL (ref 0.44–1.00)
GFR calc Af Amer: >60 mL/min (ref >60)
GFR calc non Af Amer: >60 mL/min (ref >60)
Glucose, Bld: 93 mg/dL (ref 65–99)
Potassium: 3.1 mmol/L — ABNORMAL LOW (ref 3.5–5.1)
SODIUM: 134 mmol/L — AB (ref 135–145)
Total Bilirubin: 1 mg/dL (ref 0.3–1.2)
Total Protein: 7 g/dL (ref 6.5–8.1)

## 2016-11-30 LAB — CBC WITH DIFFERENTIAL/PLATELET
Basophils Absolute: 0 10*3/uL (ref 0.0–0.1)
Basophils Relative: 0 %
EOS PCT: 0 %
Eosinophils Absolute: 0 10*3/uL (ref 0.0–0.7)
HCT: 36 % (ref 36.0–46.0)
HEMOGLOBIN: 12.4 g/dL (ref 12.0–15.0)
LYMPHS ABS: 1.3 10*3/uL (ref 0.7–4.0)
LYMPHS PCT: 12 %
MCH: 29 pg (ref 26.0–34.0)
MCHC: 34.4 g/dL (ref 30.0–36.0)
MCV: 84.1 fL (ref 78.0–100.0)
Monocytes Absolute: 0.5 10*3/uL (ref 0.1–1.0)
Monocytes Relative: 5 %
NEUTROS PCT: 83 %
Neutro Abs: 9.1 10*3/uL — ABNORMAL HIGH (ref 1.7–7.7)
PLATELETS: 160 10*3/uL (ref 150–400)
RBC: 4.28 MIL/uL (ref 3.87–5.11)
RDW: 12.8 % (ref 11.5–15.5)
WBC: 11 10*3/uL — AB (ref 4.0–10.5)

## 2016-11-30 MED ORDER — AZITHROMYCIN 250 MG PO TABS: ORAL_TABLET | ORAL | 0 refills | Status: DC

## 2016-11-30 MED ORDER — BENZONATATE 100 MG PO CAPS: 100.0000 mg | ORAL_CAPSULE | Freq: Three times a day (TID) | ORAL | 0 refills | Status: DC | PRN

## 2016-11-30 MED ORDER — SODIUM CHLORIDE 0.9 % IV BOLUS (SEPSIS)
500.0000 mL | Freq: Once | INTRAVENOUS | Status: AC
  Administered 2016-11-30: 500 mL via INTRAVENOUS

## 2016-11-30 MED ORDER — LEVOFLOXACIN IN D5W 750 MG/150ML IV SOLN
750.0000 mg | Freq: Once | INTRAVENOUS | Status: AC
  Administered 2016-11-30: 750 mg via INTRAVENOUS
  Filled 2016-11-30: qty 150

## 2016-11-30 MED ORDER — ACETAMINOPHEN 325 MG PO TABS
650.0000 mg | ORAL_TABLET | Freq: Once | ORAL | Status: AC | PRN
  Administered 2016-11-30: 650 mg via ORAL
  Filled 2016-11-30: qty 2

## 2016-11-30 MED ORDER — SODIUM CHLORIDE 0.9 % IV BOLUS (SEPSIS): 500.0000 mL | Freq: Once | INTRAVENOUS | Status: DC

## 2016-11-30 MED ORDER — LEVOFLOXACIN 750 MG PO TABS: 750.0000 mg | ORAL_TABLET | Freq: Every day | ORAL | 0 refills | Status: DC

## 2016-11-30 MED ORDER — POTASSIUM CHLORIDE CRYS ER 20 MEQ PO TBCR
40.0000 meq | EXTENDED_RELEASE_TABLET | Freq: Once | ORAL | Status: AC
  Administered 2016-11-30: 40 meq via ORAL
  Filled 2016-11-30: qty 2

## 2016-11-30 MED ORDER — KETOROLAC TROMETHAMINE 30 MG/ML IJ SOLN
30.0000 mg | Freq: Once | INTRAMUSCULAR | Status: AC
  Administered 2016-11-30: 30 mg via INTRAVENOUS
  Filled 2016-11-30: qty 1

## 2016-11-30 NOTE — Telephone Encounter (Signed)
Routing to dr plotnikov, please advise, thanks 

## 2016-11-30 NOTE — ED Provider Notes (Signed)
[facility] DEPT Provider Note   CSN: [id number] Arrival date & time: 11/30/16  1406     History   Chief Complaint Chief Complaint  Patient presents with  . Cough    HPI Melody [name] is a 61 y.o. female.  HPI Patient states she's had several weeks of URI symptoms. Took a Z-Pak end of December with some relief. States over the last few days she's had increasing cough productive of green sputum. She's had diffuse myalgias, headache and states she started having a fever yesterday evening. Has had intermittent loose stools without blood. Past Medical History:  Diagnosis Date  . Anxiety   . Chronic back pain   . Chronic neck pain   . Depression    bipolar- Dr [name]  . Fatigue   . Fibromyalgia   . GERD (gastroesophageal reflux disease)   . Headache   . Hypothyroidism   . Low back pain    Dr Arnoldo Morale  . Osteoarthritis   . Pneumonia   . Sinus congestion   . Thyroid disease    hypothyroidsm  . Vertigo   . Vitamin B12 deficiency     Patient Active Problem List   Diagnosis Date Noted  . Fatigue 02/08/2016  . Complete uterovaginal prolapse 01/10/2016  . Status post total abdominal hysterectomy and bilateral salpingo-oophorectomy 01/10/2016  . Urinary incontinence 11/09/2015  . Vaginal discomfort 08/10/2015  . Rectocele 08/10/2015  . Well adult exam 09/03/2014  . Chronic obstructive airway disease with asthma ([place]) 09/03/2014  . UTI (urinary tract infection) 06/09/2014  . Dysplastic toenail 09/02/2013  . Aphthous ulcer 06/11/2013  . Dark urine 03/18/2013  . Rash 06/08/2012  . PRURITUS 12/01/2010  . EXPOSURE TO HAZARDOUS BODY FLUID, HX OF 05/29/2010  . TOBACCO USER 02/24/2010  . BRONCHITIS, ACUTE 12/05/2009  . HIP PAIN 12/05/2009  . Acute sinusitis 09/02/2009  . RUQ PAIN 02/04/2009  . WRIST PAIN 05/24/2008  . CERVICAL STRAIN 05/24/2008  . WARTS, VIRAL, UNSPECIFIED 04/06/2008  . NEOPLASM, SKIN, UNCERTAIN BEHAVIOR 0000000  . Vitamin D deficiency 11/13/2007    . UPPER RESPIRATORY INFECTION (URI) 11/11/2007  . Hypothyroidism 08/05/2007  . Bipolar I disorder ([place]) 08/05/2007  . GERD 08/05/2007  . Anxiety state 07/23/2007  . LOW BACK PAIN 07/23/2007  . Myalgia and myositis 07/23/2007  . B12 deficiency 05/22/2007  . IRON DEFICIENCY 05/22/2007    Past Surgical History:  Procedure Laterality Date  . ABDOMINAL HYSTERECTOMY N/A 01/10/2016   Procedure: HYSTERECTOMY ABDOMINAL;  Surgeon: Nunzio Cobbs, MD;  Location: [facility];  Service: Gynecology;  Laterality: N/A;  . ABDOMINAL SACROCOLPOPEXY N/A 01/10/2016   Procedure: ABDOMINO SACROCOLPOPEXY ;  Surgeon: Nunzio Cobbs, MD;  Location: [facility];  Service: Gynecology;  Laterality: N/A;  . ANTERIOR AND POSTERIOR REPAIR N/A 01/10/2016   Procedure:  POSTERIOR REPAIR (RECTOCELE);  Surgeon: Nunzio Cobbs, MD;  Location: [facility];  Service: Gynecology;  Laterality: N/A;  . BLADDER SUSPENSION N/A 01/10/2016   Procedure: TRANSVAGINAL TAPE (TVT) PROCEDURE exact midurethral sling;  Surgeon: Nunzio Cobbs, MD;  Location: [facility];  Service: Gynecology;  Laterality: N/A;  . CERVICAL LAMINECTOMY  2001 and 2995   Dr Arnoldo Morale  . CYSTO N/A 01/10/2016   Procedure: CYSTOSCOPY;  Surgeon: Nunzio Cobbs, MD;  Location: [facility];  Service: Gynecology;  Laterality: N/A;  . SALPINGOOPHORECTOMY Bilateral 01/10/2016   Procedure: BILATERAL SALPINGO OOPHORECTOMY;  Surgeon: Nunzio Cobbs, MD;  [facility];  Service: Gynecology;  Laterality: Bilateral;  . TUBAL LIGATION  1980    OB History    Gravida Para Term Preterm AB Living   2 2 2     2    SAB TAB Ectopic Multiple Live Births                   Home Medications    Prior to Admission medications   Medication Sig Start Date End Date Taking? Authorizing Provider  Albuterol Sulfate (PROAIR RESPICLICK) [id number] (90 BASE) MCG/ACT AEPB Inhale 1-2 puffs into the lungs 4 (four) times daily as needed. 09/03/14  Yes Aleksei  Plotnikov V, MD  ALPRAZolam (XANAX) 1 MG tablet Take 1 mg by mouth 4 (four) times daily.    Yes Historical Provider, MD  amphetamine-dextroamphetamine (ADDERALL) 30 MG tablet Take 15-30 mg by mouth 2 (two) times daily as needed (For focus.). Takes one tablet in the morning, may take one-half tablet in the afternoon if needed   Yes Historical Provider, MD  cholecalciferol (VITAMIN D) 1000 units tablet Take 1,000 Units by mouth once a week.   Yes Historical Provider, MD  cyanocobalamin (COBAL-1000) 1000 MCG/ML injection Inject 1 mL (1,000 mcg total) into the muscle every 14 (fourteen) days. 02/08/16  Yes Aleksei Plotnikov V, MD  diphenhydrAMINE (BENADRYL) 2 % cream Apply topically 3 (three) times daily as needed for itching. 11/20/16  Yes Aleksei Plotnikov V, MD  eletriptan (RELPAX) 20 MG tablet Take 20 mg by mouth every 2 (two) hours as needed for migraine. may repeat in 2 hours if necessary   Yes Historical Provider, MD  fluticasone (FLONASE) 50 MCG/ACT nasal spray USE 2 SPRAYS IN BOTH NOSTRILS DAILY. Patient taking differently: Place 2 sprays into both nostrils daily. USE 2 SPRAYS IN BOTH NOSTRILS DAILY. 02/08/16  Yes Aleksei Plotnikov V, MD  levocetirizine (XYZAL) 5 MG tablet TAKE 1 TABLET BY MOUTH EVERY EVENING. Patient taking differently: TAKE 1 TABLET BY MOUTH EVERY DAILY 07/23/16  Yes Aleksei Plotnikov V, MD  levothyroxine (SYNTHROID, LEVOTHROID) 50 MCG tablet Take 1 tablet (50 mcg total) by mouth daily before breakfast. 02/08/16  Yes Aleksei Plotnikov V, MD  oxyCODONE-acetaminophen (PERCOCET/ROXICET) 5-325 MG tablet Take 1 tablet by mouth 2 (two) times daily as needed for severe pain. 11/20/16  Yes Aleksei Plotnikov V, MD  pantoprazole (PROTONIX) 40 MG tablet Take 40 mg by mouth daily.   Yes Historical Provider, MD  QUEtiapine (SEROQUEL) 25 MG tablet Take 25 mg by mouth at bedtime.    Yes Historical Provider, MD  triamcinolone ointment (KENALOG) 0.1 % Apply 1 application topically 2 (two) times  daily. 11/20/16  Yes Aleksei Plotnikov V, MD  azithromycin (ZITHROMAX Z-PAK) 250 MG tablet As directed Patient not taking: Reported on 11/30/2016 11/30/16   Lew Dawes V, MD  benzonatate (TESSALON) 100 MG capsule Take 1 capsule (100 mg total) by mouth 3 (three) times daily as needed for cough. 11/30/16   Julianne Rice, MD  levofloxacin (LEVAQUIN) 750 MG tablet Take 1 tablet (750 mg total) by mouth daily. X 7 days 12/01/16   Julianne Rice, MD    Family History Family History  Problem Relation Age of Onset  . Heart attack Father     dec heart disease age 75  . Hypertension Father   . Cancer Mother     dec stomach ca--age 34  . Breast cancer Cousin   . Arthritis Other   . Breast cancer Paternal Aunt     Social History Social History  Substance Use Topics  . Smoking status: Former Smoker    Packs/day: 0.50    Types: Cigarettes    Quit date: 05/06/2011  . Smokeless tobacco: Never Used  . Alcohol use No     Allergies   Doxycycline hyclate; Nabumetone; Sulfa antibiotics; Tetracycline hcl; and Tramadol hcl   Review of Systems Review of Systems  Constitutional: Positive for chills, fatigue and fever.  HENT: Positive for congestion and sore throat.   Respiratory: Positive for cough. Negative for shortness of breath and wheezing.   Cardiovascular: Negative for chest pain, palpitations and leg swelling.  Gastrointestinal: Positive for diarrhea. Negative for abdominal pain, blood in stool, constipation, nausea and vomiting.  Genitourinary: Negative for dysuria, flank pain and frequency.  Musculoskeletal: Positive for myalgias. Negative for neck stiffness.  Skin: Negative for rash and wound.  Neurological: Positive for headaches. Negative for dizziness, syncope, weakness, [place]-headedness and numbness.  All other systems reviewed and are negative.    Physical Exam Updated Vital Signs BP 112/63   Pulse 83   Temp 99.1 F (37.3 C) (Oral)   Resp 18   Ht 5\' 6"  (1.676 m)   Wt  134 lb (60.8 kg)   LMP 10/29/2006 (Approximate)   SpO2 96%   BMI 21.63 kg/m   Physical Exam  Constitutional: She is oriented to person, place, and time. She appears well-developed and well-nourished.  HENT:  Head: Normocephalic and atraumatic.  Mouth/Throat: Oropharynx is clear and moist. No oropharyngeal exudate.  Oropharynx is erythematous  Eyes: EOM are normal. Pupils are equal, round, and reactive to Lile.  Neck: Normal range of motion. Neck supple.  No meningismus  Cardiovascular: Normal rate and regular rhythm.  Exam reveals no gallop and no friction rub.   No murmur heard. Pulmonary/Chest: Effort normal. She has rales.  Few rales in the right lung field. No respiratory distress. No appreciated wheezing.  Abdominal: Soft. Bowel sounds are normal. There is no tenderness. There is no rebound and no guarding.  Musculoskeletal: Normal range of motion. She exhibits no edema or tenderness.  No lower extremity swelling or asymmetry.  Neurological: She is alert and oriented to person, place, and time.  Skin: Skin is warm and dry. Capillary refill takes less than 2 seconds. No rash noted. No erythema.  Psychiatric: She has a normal mood and affect. Her behavior is normal.  Nursing note and vitals reviewed.    ED Treatments / Results  Labs (all labs ordered are listed, but only abnormal results are displayed) Labs Reviewed  CBC WITH DIFFERENTIAL/PLATELET - Abnormal; Notable for the following:       Result Value   WBC 11.0 (*)    Neutro Abs 9.1 (*)    All other components within normal limits  COMPREHENSIVE METABOLIC PANEL - Abnormal; Notable for the following:    Sodium 134 (*)    Potassium 3.1 (*)    All other components within normal limits    EKG  EKG Interpretation None       Radiology Dg Chest 2 View  Result Date: 11/30/2016 CLINICAL DATA:  Initial evaluation for acute cough, fever. EXAM: CHEST  2 VIEW COMPARISON:  Prior radiograph from 05/08/2015. FINDINGS:  Cardiac and mediastinal silhouettes are stable in size and contour, and remain within normal limits. Lungs are normally inflated. Patchy infiltrate present within the mid and lower right lung, predominantly involving the right middle lobe, compatible with pneumonia. Left lung is clear. No pulmonary edema or pleural effusion. No pneumothorax. No acute osseous  abnormality.  Cervical ACDF noted. IMPRESSION: Patchy infiltrate within the right mid and lower lung, compatible with pneumonia. Electronically Signed   By: Jeannine Boga M.D.   On: 11/30/2016 15:26    Procedures Procedures (including critical care time)  Medications Ordered in ED Medications  acetaminophen (TYLENOL) tablet 650 mg (650 mg Oral Given 11/30/16 1453)  levofloxacin (LEVAQUIN) IVPB 750 mg (0 mg Intravenous Stopped 11/30/16 1912)  ketorolac (TORADOL) 30 MG/ML injection 30 mg (30 mg Intravenous Given 11/30/16 1742)  sodium chloride 0.9 % bolus 500 mL (0 mLs Intravenous Stopped 11/30/16 1825)  potassium chloride SA (K-DUR,KLOR-CON) CR tablet 40 mEq (40 mEq Oral Given 11/30/16 1842)  sodium chloride 0.9 % bolus 500 mL (0 mLs Intravenous Stopped 11/30/16 1944)     Initial Impression / Assessment and Plan / ED Course  I have reviewed the triage vital signs and the nursing notes.  Pertinent labs & imaging results that were available during my care of the patient were reviewed by me and considered in my medical decision making (see chart for details).     Patient is well-appearing. She is in no respiratory distress. Patchy infiltrate on x-ray and the right mid and lower lung fields. We'll treat for CAP. Given IV fluids, potassium replacement and IV Levaquin. Discharge home with prescription for Levaquin. Advised to follow-up with her primary physician. Return precautions given. Final Clinical Impressions(s) / ED Diagnoses   Final diagnoses:  Community acquired pneumonia, unspecified laterality  Hypokalemia    New  Prescriptions Discharge Medication List as of 11/30/2016  7:50 PM    START taking these medications   Details  azithromycin (ZITHROMAX Z-PAK) 250 MG tablet As directed, Normal    benzonatate (TESSALON) 100 MG capsule Take 1 capsule (100 mg total) by mouth 3 (three) times daily as needed for cough., Starting Fri 11/30/2016, Print    levofloxacin (LEVAQUIN) 750 MG tablet Take 1 tablet (750 mg total) by mouth daily. X 7 days, Starting Sat 12/01/2016, Print         Julianne Rice, MD 11/30/16 2251

## 2016-11-30 NOTE — ED Triage Notes (Signed)
Pt reports that she has been coughing since mid December and was given a Zpack on 10/24/16. States she conts to feel bad and coughing up green mucous. Seen by doctor over week ago and was not given any medication. Chest burns from coughing. Temp 103.1 in triage

## 2016-11-30 NOTE — Telephone Encounter (Signed)
Ok to ref a Zpac Thx 

## 2016-11-30 NOTE — Telephone Encounter (Signed)
Patient states that she is having chest pain/ congestion/ cough/ chills and fever.  Patient states Dr. Camila Li gave her a script for z pak but that is not helping.  Patient states she does not have a way in.  Patient is requesting a different script to be sent in for her.  Patient uses Melody [name].  Please follow up with patient in regard.

## 2016-12-03 NOTE — Telephone Encounter (Signed)
rx sent, see other phone note

## 2016-12-03 NOTE — Telephone Encounter (Signed)
I'm showing rx refill sent to pharm on 2/2, tried to call patient, voice mailbox not set up, no way to leave message

## 2016-12-19 ENCOUNTER — Other Ambulatory Visit: Payer: Self-pay | Admitting: Internal Medicine

## 2016-12-27 ENCOUNTER — Telehealth: Payer: Self-pay | Admitting: Obstetrics and Gynecology

## 2016-12-27 NOTE — Telephone Encounter (Signed)
Patient said  "I had surgery March 2017 and I am having a strange feeling in my stomach". Patient feels that "something is not right".

## 2017-01-03 NOTE — Progress Notes (Deleted)
61 y.o. G2P2002 Legally Separated Caucasian female here for annual exam.    PCP:     Patient's last menstrual period was 10/29/2006 (approximate).           Sexually active: {yes no:314532}  The current method of family planning is tubal ligation/Hysterectomy.    Exercising: {yes no:314532}  {types:19826} Smoker:  {YES NO:22349}  Health Maintenance: Pap: 08-10-15 Negative  History of abnormal Pap:  {YES NO:22349} MMG:  01-05-16 Density Cat.C/Left br.Neg;Rt.br.calcifications needs further evaluation. Rt. Diag.probably benign right calcifications/BiRads3/follow up in 6 months:TBC  Colonoscopy:  *** BMD:   ***  Result  *** TDaP:  *** Gardasil:   {YES NO:22349} HIV: Hep C: Screening Labs:  Hb today: ***, Urine today: ***   reports that she quit smoking about 5 years ago. Her smoking use included Cigarettes. She smoked 0.50 packs per day. She has never used smokeless tobacco. She reports that she does not drink alcohol or use drugs.  Past Medical History:  Diagnosis Date  . Anxiety   . Chronic back pain   . Chronic neck pain   . Depression    bipolar- Dr [name]  . Fatigue   . Fibromyalgia   . GERD (gastroesophageal reflux disease)   . Headache   . Hypothyroidism   . Low back pain    Dr Arnoldo Morale  . Osteoarthritis   . Pneumonia   . Sinus congestion   . Thyroid disease    hypothyroidsm  . Vertigo   . Vitamin B12 deficiency     Past Surgical History:  Procedure Laterality Date  . ABDOMINAL HYSTERECTOMY N/A 01/10/2016   Procedure: HYSTERECTOMY ABDOMINAL;  Surgeon: Nunzio Cobbs, MD;  Location: [facility];  Service: Gynecology;  Laterality: N/A;  . ABDOMINAL SACROCOLPOPEXY N/A 01/10/2016   Procedure: ABDOMINO SACROCOLPOPEXY ;  Surgeon: Nunzio Cobbs, MD;  Location: [facility];  Service: Gynecology;  Laterality: N/A;  . ANTERIOR AND POSTERIOR REPAIR N/A 01/10/2016   Procedure:  POSTERIOR REPAIR (RECTOCELE);  Surgeon: Nunzio Cobbs, MD;  Location: [facility];   Service: Gynecology;  Laterality: N/A;  . BLADDER SUSPENSION N/A 01/10/2016   Procedure: TRANSVAGINAL TAPE (TVT) PROCEDURE exact midurethral sling;  Surgeon: Nunzio Cobbs, MD;  Location: [facility];  Service: Gynecology;  Laterality: N/A;  . CERVICAL LAMINECTOMY  2001 and 2995   Dr Arnoldo Morale  . CYSTO N/A 01/10/2016   Procedure: CYSTOSCOPY;  Surgeon: Nunzio Cobbs, MD;  Location: [facility];  Service: Gynecology;  Laterality: N/A;  . SALPINGOOPHORECTOMY Bilateral 01/10/2016   Procedure: BILATERAL SALPINGO OOPHORECTOMY;  Surgeon: Nunzio Cobbs, MD;  Location: [facility];  Service: Gynecology;  Laterality: Bilateral;  . TUBAL LIGATION  1980    Current Outpatient Prescriptions  Medication Sig Dispense Refill  . Albuterol Sulfate (PROAIR RESPICLICK) 737 (90 BASE) MCG/ACT AEPB Inhale 1-2 puffs into the lungs 4 (four) times daily as needed. 1 each 11  . ALPRAZolam (XANAX) 1 MG tablet Take 1 mg by mouth 4 (four) times daily.     Marland Kitchen amphetamine-dextroamphetamine (ADDERALL) 30 MG tablet Take 15-30 mg by mouth 2 (two) times daily as needed (For focus.). Takes one tablet in the morning, may take one-half tablet in the afternoon if needed    . azithromycin (ZITHROMAX Z-PAK) 250 MG tablet As directed (Patient not taking: Reported on 11/30/2016) 6 each 0  . benzonatate (TESSALON) 100 MG capsule Take 1 capsule (100 mg total) by mouth 3 (  three) times daily as needed for cough. 21 capsule 0  . cholecalciferol (VITAMIN D) 1000 units tablet Take 1,000 Units by mouth once a week.    . cyanocobalamin (COBAL-1000) 1000 MCG/ML injection Inject 1 mL (1,000 mcg total) into the muscle every 14 (fourteen) days. 10 mL 11  . diphenhydrAMINE (BENADRYL) 2 % cream Apply topically 3 (three) times daily as needed for itching. 30 g 2  . eletriptan (RELPAX) 20 MG tablet Take 20 mg by mouth every 2 (two) hours as needed for migraine. may repeat in 2 hours if necessary    . fluticasone (FLONASE) 50 MCG/ACT nasal spray  USE 2 SPRAYS IN BOTH NOSTRILS DAILY. (Patient taking differently: Place 2 sprays into both nostrils daily. USE 2 SPRAYS IN BOTH NOSTRILS DAILY.) 16 g 11  . levocetirizine (XYZAL) 5 MG tablet TAKE 1 TABLET BY MOUTH EVERY EVENING. (Patient taking differently: TAKE 1 TABLET BY MOUTH EVERY DAILY) 30 tablet 5  . levofloxacin (LEVAQUIN) 750 MG tablet Take 1 tablet (750 mg total) by mouth daily. X 7 days 7 tablet 0  . levothyroxine (SYNTHROID, LEVOTHROID) 50 MCG tablet Take 1 tablet (50 mcg total) by mouth daily before breakfast. 30 tablet 11  . oxyCODONE-acetaminophen (PERCOCET/ROXICET) 5-325 MG tablet Take 1 tablet by mouth 2 (two) times daily as needed for severe pain. 60 tablet 0  . pantoprazole (PROTONIX) 40 MG tablet Take 40 mg by mouth daily.    . pantoprazole (PROTONIX) 40 MG tablet TAKE 1 TABLET BY MOUTH EVERY DAY 30 tablet 5  . QUEtiapine (SEROQUEL) 25 MG tablet Take 25 mg by mouth at bedtime.     . triamcinolone ointment (KENALOG) 0.1 % Apply 1 application topically 2 (two) times daily. 80 g 3   No current facility-administered medications for this visit.     Family History  Problem Relation Age of Onset  . Heart attack Father     dec heart disease age 61  . Hypertension Father   . Cancer Mother     dec stomach ca--age 83  . Breast cancer Cousin   . Arthritis Other   . Breast cancer Paternal Aunt     ROS:  Pertinent items are noted in HPI.  Otherwise, a comprehensive ROS was negative.  Exam:   LMP 10/29/2006 (Approximate)     General appearance: alert, cooperative and appears stated age Head: Normocephalic, without obvious abnormality, atraumatic Neck: no adenopathy, supple, symmetrical, trachea midline and thyroid normal to inspection and palpation Lungs: clear to auscultation bilaterally Breasts: normal appearance, no masses or tenderness, No nipple retraction or dimpling, No nipple discharge or bleeding, No axillary or supraclavicular adenopathy Heart: regular rate and  rhythm Abdomen: soft, non-tender; no masses, no organomegaly Extremities: extremities normal, atraumatic, no cyanosis or edema Skin: Skin color, texture, turgor normal. No rashes or lesions Lymph nodes: Cervical, supraclavicular, and axillary nodes normal. No abnormal inguinal nodes palpated Neurologic: Grossly normal  Pelvic: External genitalia:  no lesions              Urethra:  normal appearing urethra with no masses, tenderness or lesions              Bartholins and Skenes: normal                 Vagina: normal appearing vagina with normal color and discharge, no lesions              Cervix: no lesions  Pap taken: {yes no:314532} Bimanual Exam:  Uterus:  normal size, contour, position, consistency, mobility, non-tender              Adnexa: no mass, fullness, tenderness              Rectal exam: {yes no:314532}.  Confirms.              Anus:  normal sphincter tone, no lesions  Chaperone was present for exam.  Assessment:   Well woman visit with normal exam.   Plan: Mammogram screening discussed. Recommended self breast awareness. Pap and HR HPV as above. Guidelines for Calcium, Vitamin D, regular exercise program including cardiovascular and weight bearing exercise.   Follow up annually and prn.   Additional counseling given.  {yes [id number]. _______ minutes face to face time of which over 50% was spent in counseling.    After visit summary provided.

## 2017-01-07 ENCOUNTER — Ambulatory Visit: Payer: Medicare Other | Admitting: Obstetrics and Gynecology

## 2017-01-11 NOTE — Telephone Encounter (Signed)
Spoke with patient, advised as seen below per Dr. Quincy Simmonds. Patient states she has had intermittent diarrhea since she was sick in January with pneumonia. Recommended f/u with pcp for evaluation of diarrhea. Patient states she will return call to office on Monday to schedule AEX with Dr. Quincy Simmonds.   Routing to provider for final review. Patient is agreeable to disposition. Will close encounter.

## 2017-01-11 NOTE — Telephone Encounter (Signed)
I would recommend patient take a daily stool softener such as Colace 100 mg.  This may help with pain if it is related at all to her bowel function.  I will be happy to see her for her exam and help further.

## 2017-01-11 NOTE — Telephone Encounter (Addendum)
Spoke with patient. Patient states she called on 3/1 about pain she was experiencing and never received a call back. Patient states pain got better so she thought that she would wait and discuss at AEX 3/12. Patient states had to cancel AEX d/t snow. Apologized to patient, message not received by triage. Patient states she had hysterectomy in March 2017. Patient reports pain on right side "before hip bone not at pelvis, close to belly button". Patient reports pain comes and goes, feels like bloating/ gas. Patient reports pain still there daily and intermittent, but better. Patient states she was separated from spouse at time of hysterectomy, just recently got back together. Patient reports having intercourse 3 time and painful each time. Patient reports spotting when wiping afterwards. Patient states lubricant was used first 2 times, but not the last, reports no difference. Patient states she takes prn percocet for chronic back pain and OA. Patient reports weight gain after hysterectomy and experienced some depression with separation. Patient denies any vaginal discharge, odor or urinary complaints. Recommended OV for further evaluation, patient declined OV for 3/16, 3/19, 3/21. Patient states she would discuss at AEX. Offered patient appointment at 3pm on 3/19 for AEX, patient states she will need spouse to bring her will have to confirm prior to scheduling. Patient states she will have to return call on Monday, advised patient that appointment time may not be available at that time, can look at other options if not. Patient states she will need afternoon appointment. Advised patient should pain worsen return call to office or  seek care at local ER/Urgent care after hours. Advised patient would review with Dr. Quincy Simmonds and return call with any additional recommendations. Patient is agreeable and thankful for return call.  Dr. Quincy Simmonds, any additional recommendations?  Cc: Lamont Snowball

## 2017-01-11 NOTE — Telephone Encounter (Signed)
Patient is having pain and gas issues.

## 2017-01-16 ENCOUNTER — Telehealth: Payer: Self-pay | Admitting: Obstetrics and Gynecology

## 2017-01-16 NOTE — Telephone Encounter (Signed)
Patient says she is calling nurse back to reschedule a follow up appointment with Dr Quincy Simmonds.

## 2017-01-16 NOTE — Telephone Encounter (Signed)
Spoke with patient. Patient calling to re-schedule AEX canceled d/t weather on 3/12. Patient states she needs afternoon appointment, can't do mornings. Patient declined earlier appointments d/t possibility of snow.  Patient scheduled for 01/24/17 at 2:30pm with Dr. Quincy Simmonds. Patient agreeable to date and time.  Routing to provider for final review. Patient is agreeable to disposition. Will close encounter.

## 2017-01-18 ENCOUNTER — Other Ambulatory Visit: Payer: Self-pay | Admitting: Internal Medicine

## 2017-01-21 ENCOUNTER — Other Ambulatory Visit: Payer: Self-pay | Admitting: Internal Medicine

## 2017-01-22 ENCOUNTER — Telehealth: Payer: Self-pay | Admitting: *Deleted

## 2017-01-22 NOTE — Telephone Encounter (Signed)
Spoke with patient. Patient is agreeable to moving 01/24/17 AEX appointment time from 2:30 pm to 1:30 pm.  Appointment time changed.   Routing to provider for final review. Patient is agreeable to disposition. Will close encounter.

## 2017-01-23 NOTE — Progress Notes (Signed)
61 y.o. G2P2002 Legally Separated Caucasian female here for annual exam.    Reunited with husband.  Son and his wife are living at home.   Feels like she wants to disappear.  Not suicidal.  Sees Dr. [name] for psychiatric care for many years.  Had intercourse and had dryness and pain.   Heat and cold intolerance.  Weight gain.  On Synthroid.  Had pneumonia.   Regular visits with PCP.  PCP:  Walker Kehr, MD  Patient's last menstrual period was 10/29/2006 (approximate).           Sexually active: Yes.   female The current method of family planning is tubal ligation/Hysterectomy.    Exercising: No.   Smoker:  Former, quit 2013  Health Maintenance: Pap:08-10-15 Neg:no HR HPV done   History of abnormal Pap:  no MMG: 01-02-16 Density C Rt.Br.calcifications;Lt.Br.Neg--additional imaging rec.01-06-16 Rt.Diag.MMG probably benign Rt.Br.calcifications--rec.6 mo.Rt.Br.Diag.MMG./BiRads3:TBC Colonoscopy: Thinks she had one with [facility] and had benign colon polyps:unsure of date or doctor. Refuses to have another one. She does yearly hemoccults with PCP and it was negative 09/2016. BMD: N/A  Result  N/A TDaP:  08-01-15 Gardasil:   N/A HIV:  today Hep C:  today Screening Labs:  Hb today: PCP, Urine today: not done   reports that she quit smoking about 5 years ago. Her smoking use included Cigarettes. She smoked 0.50 packs per day. She has never used smokeless tobacco. She reports that she does not drink alcohol or use drugs.  Past Medical History:  Diagnosis Date  . Anxiety   . Chronic back pain   . Chronic neck pain   . Depression    bipolar- Dr [name]  . Fatigue   . Fibromyalgia   . GERD (gastroesophageal reflux disease)   . Headache   . Hypothyroidism   . Low back pain    Dr Arnoldo Morale  . Osteoarthritis   . Pneumonia   . Sinus congestion   . Thyroid disease    hypothyroidsm  . Vertigo   . Vitamin B12 deficiency     Past Surgical History:  Procedure Laterality Date  .  ABDOMINAL HYSTERECTOMY N/A 01/10/2016   Procedure: HYSTERECTOMY ABDOMINAL;  Surgeon: Nunzio Cobbs, MD;  Location: [facility];  Service: Gynecology;  Laterality: N/A;  . ABDOMINAL SACROCOLPOPEXY N/A 01/10/2016   Procedure: ABDOMINO SACROCOLPOPEXY ;  Surgeon: Nunzio Cobbs, MD;  Location: [facility];  Service: Gynecology;  Laterality: N/A;  . ANTERIOR AND POSTERIOR REPAIR N/A 01/10/2016   Procedure:  POSTERIOR REPAIR (RECTOCELE);  Surgeon: Nunzio Cobbs, MD;  Location: [facility];  Service: Gynecology;  Laterality: N/A;  . BLADDER SUSPENSION N/A 01/10/2016   Procedure: TRANSVAGINAL TAPE (TVT) PROCEDURE exact midurethral sling;  Surgeon: Nunzio Cobbs, MD;  Location: [facility];  Service: Gynecology;  Laterality: N/A;  . CERVICAL LAMINECTOMY  2001 and 2995   Dr Arnoldo Morale  . CYSTO N/A 01/10/2016   Procedure: CYSTOSCOPY;  Surgeon: Nunzio Cobbs, MD;  Location: [facility];  Service: Gynecology;  Laterality: N/A;  . SALPINGOOPHORECTOMY Bilateral 01/10/2016   Procedure: BILATERAL SALPINGO OOPHORECTOMY;  Surgeon: Nunzio Cobbs, MD;  Location: [facility];  Service: Gynecology;  Laterality: Bilateral;  . TUBAL LIGATION  1980    Current Outpatient Prescriptions  Medication Sig Dispense Refill  . Albuterol Sulfate (PROAIR RESPICLICK) 161 (90 BASE) MCG/ACT AEPB Inhale 1-2 puffs into the lungs 4 (four) times daily as needed. 1 each 11  .  ALPRAZolam (XANAX) 1 MG tablet Take 1 mg by mouth 4 (four) times daily.     Marland Kitchen amphetamine-dextroamphetamine (ADDERALL) 30 MG tablet Take 15-30 mg by mouth 2 (two) times daily as needed (For focus.). Takes one tablet in the morning, may take one-half tablet in the afternoon if needed    . cholecalciferol (VITAMIN D) 1000 units tablet Take 1,000 Units by mouth once a week.    . cyanocobalamin (COBAL-1000) 1000 MCG/ML injection Inject 1 mL (1,000 mcg total) into the muscle every 14 (fourteen) days. 10 mL 11  . fluticasone (FLONASE) 50 MCG/ACT  nasal spray USE 2 SPRAYS IN BOTH NOSTRILS DAILY. (Patient taking differently: Place 2 sprays into both nostrils daily. USE 2 SPRAYS IN BOTH NOSTRILS DAILY.) 16 g 11  . levocetirizine (XYZAL) 5 MG tablet TAKE 1 TABLET BY MOUTH EVERY EVENING. 30 tablet 5  . levofloxacin (LEVAQUIN) 750 MG tablet Take 1 tablet (750 mg total) by mouth daily. X 7 days 7 tablet 0  . levothyroxine (SYNTHROID, LEVOTHROID) 50 MCG tablet Take 1 tablet (50 mcg total) by mouth daily before breakfast. 30 tablet 11  . oxyCODONE-acetaminophen (PERCOCET/ROXICET) 5-325 MG tablet Take 1 tablet by mouth 2 (two) times daily as needed for severe pain. 60 tablet 0  . pantoprazole (PROTONIX) 40 MG tablet TAKE 1 TABLET BY MOUTH EVERY DAY 30 tablet 5  . PROAIR HFA 108 (90 Base) MCG/ACT inhaler INHALE 2 PUFFS BY MOUTH EVERY FOUR HOURS AS NEEDED 8.5 g 2  . QUEtiapine (SEROQUEL) 25 MG tablet Take 25 mg by mouth at bedtime.     . triamcinolone ointment (KENALOG) 0.1 % Apply 1 application topically 2 (two) times daily. 80 g 3   No current facility-administered medications for this visit.     Family History  Problem Relation Age of Onset  . Heart attack Father     dec heart disease age 70  . Hypertension Father   . Cancer Mother     dec stomach ca--age 76  . Breast cancer Cousin   . Arthritis Other   . Breast cancer Paternal Aunt     ROS:  Pertinent items are noted in HPI.  Otherwise, a comprehensive ROS was negative.  Exam:   BP 120/64 (BP Location: Right Arm, Patient Position: Sitting, Cuff Size: Normal)   Pulse 76   Resp 20   Ht 5' 6.5" (1.689 m)   Wt 143 lb 12.8 oz (65.2 kg)   LMP 10/29/2006 (Approximate)   BMI 22.86 kg/m     General appearance: alert, cooperative and appears stated age Head: Normocephalic, without obvious abnormality, atraumatic Neck: no adenopathy, supple, symmetrical, trachea midline and thyroid normal to inspection and palpation Lungs: clear to auscultation bilaterally Breasts: normal appearance, no  masses or tenderness, No nipple retraction or dimpling, No nipple discharge or bleeding, No axillary or supraclavicular adenopathy Heart: regular rate and rhythm Abdomen: soft, non-tender; no masses, no organomegaly Extremities: extremities normal, atraumatic, no cyanosis or edema Skin: Skin color, texture, turgor normal. No rashes or lesions Lymph nodes: Cervical, supraclavicular, and axillary nodes normal. No abnormal inguinal nodes palpated Neurologic: Grossly normal  Pelvic: External genitalia:   Erythema near urethra.              Urethra:  normal appearing urethra with no masses, tenderness or lesions              Bartholins and Skenes: normal  Vagina: normal appearing vagina with normal color and discharge, no lesions              Cervix:  Absent.               Pap taken: No. Bimanual Exam:  Uterus:  Absent.              Adnexa: no mass, fullness, tenderness              Rectal exam: Yes.  .  Confirms.              Anus:  normal sphincter tone, no lesions  Chaperone was present for exam.  Assessment:   Well woman visit with normal exam. Status post TAH with sacrocolpopexy, anterior and posterior colporrhaphy, TVT sling and cystoscopy. STD screening.  Vaginal atrophy. Depression.   Plan: Mammogram screening discussed.  Needs bilateral dx mammogram follow up.  Recommended self breast awareness. Pap and HR HPV as above. Guidelines for Calcium, Vitamin D, regular exercise program including cardiovascular and weight bearing exercise. STD testing.   Vaginal estrogen after patient has mammogram updated. Name of Melody [name] to patient.  Contact information for counseling at [facility] given to patient.  She will call to schedule appointment. I recommend she contact Dr. [name] for potential med adjustment. Patient sees Dr. Alain Marion for her routine medical care. Follow up annually and prn.      After visit summary provided.

## 2017-01-24 ENCOUNTER — Ambulatory Visit (INDEPENDENT_AMBULATORY_CARE_PROVIDER_SITE_OTHER): Payer: Medicare Other | Admitting: Obstetrics and Gynecology

## 2017-01-24 ENCOUNTER — Encounter: Payer: Self-pay | Admitting: Obstetrics and Gynecology

## 2017-01-24 VITALS — BP 120/64 | HR 76 | Resp 20 | Ht 66.5 in | Wt 143.8 lb

## 2017-01-24 DIAGNOSIS — N952 Postmenopausal atrophic vaginitis: Secondary | ICD-10-CM | POA: Diagnosis not present

## 2017-01-24 DIAGNOSIS — Z113 Encounter for screening for infections with a predominantly sexual mode of transmission: Secondary | ICD-10-CM | POA: Diagnosis not present

## 2017-01-24 DIAGNOSIS — Z01419 Encounter for gynecological examination (general) (routine) without abnormal findings: Secondary | ICD-10-CM

## 2017-01-24 DIAGNOSIS — R921 Mammographic calcification found on diagnostic imaging of breast: Secondary | ICD-10-CM | POA: Diagnosis not present

## 2017-01-24 NOTE — Patient Instructions (Signed)

## 2017-01-24 NOTE — Progress Notes (Signed)
Patient scheduled while in office for diagnostic bilateral MMG. Spoke with Anderson Malta at [facility]. Patient scheduled for 01/30/17 at 1:30pm.  Patient is agreeable to date and time.

## 2017-01-25 LAB — GC/CHLAMYDIA PROBE AMP
CT PROBE, AMP APTIMA: NOT DETECTED
GC Probe RNA: NOT DETECTED

## 2017-01-25 LAB — STD PANEL
HEP B S AG: NEGATIVE
HIV: NONREACTIVE

## 2017-01-25 LAB — HEPATITIS C ANTIBODY: HCV Ab: NEGATIVE

## 2017-01-28 ENCOUNTER — Telehealth: Payer: Self-pay

## 2017-01-28 NOTE — Telephone Encounter (Signed)
-----   Message from Nunzio Cobbs, MD sent at 01/25/2017  6:50 PM EDT ----- Please let patient know of negative STD testing including HIV, syphilis, hep B and C, and gonorrhea and chlamydia.

## 2017-01-28 NOTE — Telephone Encounter (Signed)
Spoke with patient. Advised of message as seen below from Dr.Silva. Patient is agreeable and verbalizes understanding.  Routing to provider for final review. Patient agreeable to disposition. Will close encounter.  

## 2017-01-30 ENCOUNTER — Ambulatory Visit
Admission: RE | Admit: 2017-01-30 | Discharge: 2017-01-30 | Disposition: A | Discharge status: Home / Self Care | Payer: Medicare Other | Source: Ambulatory Visit | Attending: Obstetrics and Gynecology | Admitting: Obstetrics and Gynecology

## 2017-01-30 ENCOUNTER — Other Ambulatory Visit: Payer: Self-pay | Admitting: Obstetrics and Gynecology

## 2017-01-30 DIAGNOSIS — R921 Mammographic calcification found on diagnostic imaging of breast: Secondary | ICD-10-CM

## 2017-02-12 ENCOUNTER — Ambulatory Visit
Admission: RE | Admit: 2017-02-12 | Discharge: 2017-02-12 | Disposition: A | Discharge status: Home / Self Care | Payer: Medicare Other | Source: Ambulatory Visit | Attending: Obstetrics and Gynecology | Admitting: Obstetrics and Gynecology

## 2017-02-12 ENCOUNTER — Other Ambulatory Visit: Payer: Self-pay | Admitting: Obstetrics and Gynecology

## 2017-02-12 DIAGNOSIS — R921 Mammographic calcification found on diagnostic imaging of breast: Secondary | ICD-10-CM

## 2017-02-12 DIAGNOSIS — N6011 Diffuse cystic mastopathy of right breast: Secondary | ICD-10-CM | POA: Diagnosis not present

## 2017-02-18 ENCOUNTER — Ambulatory Visit (INDEPENDENT_AMBULATORY_CARE_PROVIDER_SITE_OTHER)
Admission: RE | Admit: 2017-02-18 | Discharge: 2017-02-18 | Disposition: A | Discharge status: Home / Self Care | Payer: Medicare Other | Source: Ambulatory Visit | Attending: Internal Medicine | Admitting: Internal Medicine

## 2017-02-18 ENCOUNTER — Ambulatory Visit (INDEPENDENT_AMBULATORY_CARE_PROVIDER_SITE_OTHER): Payer: Medicare Other | Admitting: Internal Medicine

## 2017-02-18 ENCOUNTER — Encounter: Payer: Self-pay | Admitting: Internal Medicine

## 2017-02-18 DIAGNOSIS — J449 Chronic obstructive pulmonary disease, unspecified: Secondary | ICD-10-CM

## 2017-02-18 DIAGNOSIS — E034 Atrophy of thyroid (acquired): Secondary | ICD-10-CM

## 2017-02-18 DIAGNOSIS — J181 Lobar pneumonia, unspecified organism: Secondary | ICD-10-CM

## 2017-02-18 DIAGNOSIS — J189 Pneumonia, unspecified organism: Secondary | ICD-10-CM | POA: Insufficient documentation

## 2017-02-18 DIAGNOSIS — R0602 Shortness of breath: Secondary | ICD-10-CM | POA: Diagnosis not present

## 2017-02-18 DIAGNOSIS — G8929 Other chronic pain: Secondary | ICD-10-CM

## 2017-02-18 DIAGNOSIS — M5440 Lumbago with sciatica, unspecified side: Secondary | ICD-10-CM

## 2017-02-18 DIAGNOSIS — R05 Cough: Secondary | ICD-10-CM | POA: Diagnosis not present

## 2017-02-18 MED ORDER — FLUTICASONE FUROATE-VILANTEROL 100-25 MCG/INH IN AEPB: 1.0000 | INHALATION_SPRAY | Freq: Every day | RESPIRATORY_TRACT | 5 refills | Status: DC

## 2017-02-18 MED ORDER — OXYCODONE-ACETAMINOPHEN 5-325 MG PO TABS: 1.0000 | ORAL_TABLET | Freq: Two times a day (BID) | ORAL | 0 refills | Status: DC | PRN

## 2017-02-18 MED ORDER — LEVOTHYROXINE SODIUM 50 MCG PO TABS: 50.0000 ug | ORAL_TABLET | Freq: Every day | ORAL | 11 refills | Status: DC

## 2017-02-18 MED ORDER — METHYLPREDNISOLONE ACETATE 80 MG/ML IJ SUSP
80.0000 mg | Freq: Once | INTRAMUSCULAR | Status: AC
  Administered 2017-02-18: 80 mg via INTRAMUSCULAR

## 2017-02-18 NOTE — Patient Instructions (Signed)
MC well w/Jill 

## 2017-02-18 NOTE — Assessment & Plan Note (Signed)
Worse CXR Breo qd

## 2017-02-18 NOTE — Assessment & Plan Note (Signed)
Percocet prn  Potential benefits of a long term opioids use as well as potential risks (i.e. addiction risk, apnea etc) and complications (i.e. Somnolence, constipation and others) were explained to the patient and were aknowledged.  

## 2017-02-18 NOTE — Progress Notes (Signed)
Pre visit review using our clinic review tool, if applicable. No additional management support is needed unless otherwise documented below in the visit note. 

## 2017-02-18 NOTE — Progress Notes (Signed)
Subjective:  Patient ID: Melody [name], female    DOB: [birth year]  Age: 61 y.o. MRN: [id number]  CC: No chief complaint on file.   HPI Melody [name] presents for anxiety, LBP, GERD f/u C/o pneumonia in Feb on the R C/o cough since Feb  Outpatient Medications Prior to Visit  Medication Sig Dispense Refill  . Albuterol Sulfate (PROAIR RESPICLICK) 938 (90 BASE) MCG/ACT AEPB Inhale 1-2 puffs into the lungs 4 (four) times daily as needed. 1 each 11  . ALPRAZolam (XANAX) 1 MG tablet Take 1 mg by mouth 4 (four) times daily.     Marland Kitchen amphetamine-dextroamphetamine (ADDERALL) 30 MG tablet Take 15-30 mg by mouth 2 (two) times daily as needed (For focus.). Takes one tablet in the morning, may take one-half tablet in the afternoon if needed    . cholecalciferol (VITAMIN D) 1000 units tablet Take 1,000 Units by mouth once a week.    . cyanocobalamin (COBAL-1000) 1000 MCG/ML injection Inject 1 mL (1,000 mcg total) into the muscle every 14 (fourteen) days. 10 mL 11  . fluticasone (FLONASE) 50 MCG/ACT nasal spray USE 2 SPRAYS IN BOTH NOSTRILS DAILY. (Patient taking differently: Place 2 sprays into both nostrils daily. USE 2 SPRAYS IN BOTH NOSTRILS DAILY.) 16 g 11  . levocetirizine (XYZAL) 5 MG tablet TAKE 1 TABLET BY MOUTH EVERY EVENING. 30 tablet 5  . levofloxacin (LEVAQUIN) 750 MG tablet Take 1 tablet (750 mg total) by mouth daily. X 7 days 7 tablet 0  . levothyroxine (SYNTHROID, LEVOTHROID) 50 MCG tablet Take 1 tablet (50 mcg total) by mouth daily before breakfast. 30 tablet 11  . oxyCODONE-acetaminophen (PERCOCET/ROXICET) 5-325 MG tablet Take 1 tablet by mouth 2 (two) times daily as needed for severe pain. 60 tablet 0  . pantoprazole (PROTONIX) 40 MG tablet TAKE 1 TABLET BY MOUTH EVERY DAY 30 tablet 5  . PROAIR HFA 108 (90 Base) MCG/ACT inhaler INHALE 2 PUFFS BY MOUTH EVERY FOUR HOURS AS NEEDED 8.5 g 2  . QUEtiapine (SEROQUEL) 25 MG tablet Take 25 mg by mouth at bedtime.     . triamcinolone ointment  (KENALOG) 0.1 % Apply 1 application topically 2 (two) times daily. 80 g 3   No facility-administered medications prior to visit.     ROS Review of Systems  Constitutional: Negative for activity change, appetite change, chills, fatigue and unexpected weight change.  HENT: Negative for congestion, mouth sores and sinus pressure.   Eyes: Negative for visual disturbance.  Respiratory: Positive for cough. Negative for chest tightness.   Gastrointestinal: Negative for abdominal pain and nausea.  Genitourinary: Negative for difficulty urinating, frequency and vaginal pain.  Musculoskeletal: Positive for arthralgias, back pain, myalgias, neck pain and neck stiffness. Negative for gait problem.  Skin: Negative for pallor and rash.  Neurological: Negative for dizziness, tremors, weakness, numbness and headaches.  Psychiatric/Behavioral: Negative for confusion and sleep disturbance.    Objective:  BP 132/78 (BP Location: Left Arm, Patient Position: Sitting, Cuff Size: Normal)   Pulse 66   Temp 97.8 F (36.6 C) (Oral)   Ht 5' 6.5" (1.689 m)   Wt 145 lb 0.6 oz (65.8 kg)   LMP 10/29/2006 (Approximate)   SpO2 98%   BMI 23.06 kg/m   BP Readings from Last 3 Encounters:  02/18/17 132/78  01/24/17 120/64  11/30/16 112/63    Wt Readings from Last 3 Encounters:  02/18/17 145 lb 0.6 oz (65.8 kg)  01/24/17 143 lb 12.8 oz (65.2 kg)  11/30/16 134  lb (60.8 kg)    Physical Exam  Constitutional: She appears well-developed. No distress.  HENT:  Head: Normocephalic.  Right Ear: External ear normal.  Left Ear: External ear normal.  Nose: Nose normal.  Mouth/Throat: Oropharynx is clear and moist.  Eyes: Conjunctivae are normal. Pupils are equal, round, and reactive to [place]. Right eye exhibits no discharge. Left eye exhibits no discharge.  Neck: Normal range of motion. Neck supple. No JVD present. No tracheal deviation present. No thyromegaly present.  Cardiovascular: Normal rate, regular rhythm  and normal heart sounds.   Pulmonary/Chest: No stridor. No respiratory distress. She has no wheezes.  Abdominal: Soft. Bowel sounds are normal. She exhibits no distension and no mass. There is no tenderness. There is no rebound and no guarding.  Musculoskeletal: She exhibits no edema or tenderness.  Lymphadenopathy:    She has no cervical adenopathy.  Neurological: She displays normal reflexes. No cranial nerve deficit. She exhibits normal muscle tone. Coordination normal.  Skin: No rash noted. No erythema.  Psychiatric: She has a normal mood and affect. Her behavior is normal. Judgment and thought content normal.  B wheezing - mild   I personally provided Breo  inhaler use teaching. After the teaching patient was able to demonstrate it's use effectively. All questions were answered   Lab Results  Component Value Date   WBC 11.0 (H) 11/30/2016   HGB 12.4 11/30/2016   HCT 36.0 11/30/2016   PLT 160 11/30/2016   GLUCOSE 93 11/30/2016   CHOL 186 09/03/2014   TRIG 64.0 09/03/2014   HDL 55.00 09/03/2014   LDLCALC 118 (H) 09/03/2014   ALT 15 11/30/2016   AST 18 11/30/2016   NA 134 (L) 11/30/2016   K 3.1 (L) 11/30/2016   CL 101 11/30/2016   CREATININE 0.82 11/30/2016   BUN 10 11/30/2016   CO2 25 11/30/2016   TSH 3.19 08/17/2016    Mm Clip Placement Right  Result Date: 02/12/2017 CLINICAL DATA:  Indeterminate right breast calcifications. EXAM: DIAGNOSTIC RIGHT MAMMOGRAM POST STEREOTACTIC BIOPSY COMPARISON:  Previous exam(s). FINDINGS: Mammographic images were obtained following stereotactic guided biopsy of right breast calcifications. Coil shaped marking clip in appropriate position. IMPRESSION: Appropriate position coil shaped marking clip status post stereotactic guided biopsy right breast calcifications. Final Assessment: Post Procedure Mammograms for Marker Placement Electronically Signed   By: Lovey Newcomer M.D.   On: 02/12/2017 12:35   Mm Rt Breast Bx W Loc Dev 1st Lesion Image  Bx Spec Stereo Guide  Addendum Date: 02/14/2017   ADDENDUM REPORT: 02/13/2017 15:19 ADDENDUM: Pathology revealed FIBROCYSTIC CHANGE WITH CALCIFICATIONS of the Right breast, upper outer. This was found to be concordant by Dr. Lovey Newcomer. Pathology results were discussed with the patient by telephone. The patient reported doing well after the biopsy with tenderness at the site. Post biopsy instructions and care were reviewed and questions were answered. The patient was encouraged to call [facility] for any additional concerns. The patient was instructed to return for annual screening mammography and informed a reminder notice would be sent regarding this appointment. Pathology results reported by Terie Purser, RN on 02/13/2017. Electronically Signed   By: Lovey Newcomer M.D.   On: 02/13/2017 15:19   Result Date: 02/14/2017 CLINICAL DATA:  Indeterminate right breast calcifications. EXAM: RIGHT BREAST STEREOTACTIC CORE NEEDLE BIOPSY COMPARISON:  Previous exams. FINDINGS: The patient and I discussed the procedure of stereotactic-guided biopsy including benefits and alternatives. We discussed the high likelihood of a successful procedure.  We discussed the risks of the procedure including infection, bleeding, tissue injury, clip migration, and inadequate sampling. Informed written consent was given. The usual time out protocol was performed immediately prior to the procedure. Using sterile technique and 1% Lidocaine as local anesthetic, under stereotactic guidance, a 9 gauge vacuum assisted device was used to perform core needle biopsy of calcifications within the upper-outer right breast posterior depth using a cranial approach. Specimen radiograph was performed showing calcifications. Specimens with calcifications are identified for pathology. Lesion quadrant: Upper outer quadrant At the conclusion of the procedure, a coil shaped tissue marker clip was deployed into the biopsy cavity.  Follow-up 2-view mammogram was performed and dictated separately. IMPRESSION: Stereotactic-guided biopsy of right breast calcifications. No apparent complications. Electronically Signed: By: Lovey Newcomer M.D. On: 02/12/2017 12:35    Assessment & Plan:   There are no diagnoses linked to this encounter. I am having Ms. Zamarripa maintain her QUEtiapine, ALPRAZolam, amphetamine-dextroamphetamine, Albuterol Sulfate, cholecalciferol, fluticasone, levothyroxine, cyanocobalamin, oxyCODONE-acetaminophen, triamcinolone ointment, levofloxacin, pantoprazole, levocetirizine, and PROAIR HFA.  No orders of the defined types were placed in this encounter.    Follow-up: No Follow-up on file.  Walker Kehr, MD

## 2017-02-18 NOTE — Assessment & Plan Note (Signed)
On Levothroid 

## 2017-02-18 NOTE — Assessment & Plan Note (Signed)
Repeat CXR

## 2017-02-18 NOTE — Addendum Note (Signed)
Addended by: Karren Cobble on: 02/18/2017 04:42 PM   Modules accepted: Orders

## 2017-02-20 ENCOUNTER — Other Ambulatory Visit: Payer: Self-pay | Admitting: Internal Medicine

## 2017-02-20 ENCOUNTER — Telehealth: Payer: Self-pay

## 2017-02-20 DIAGNOSIS — R9389 Abnormal findings on diagnostic imaging of other specified body structures: Secondary | ICD-10-CM

## 2017-02-20 NOTE — Telephone Encounter (Signed)
I have explained chest xray results and dr plotnikovs instructions to patient----dr plotnikov has ordered BMET lab for patient to do (to check kidney function) since she will also need to have CT scan with contrast done---also ordered by dr plotnikov----I will call patient back with lab results and I have advised mary/referrals to proceed with CT scan referral

## 2017-02-21 ENCOUNTER — Other Ambulatory Visit: Payer: Self-pay | Admitting: Internal Medicine

## 2017-02-21 ENCOUNTER — Other Ambulatory Visit (INDEPENDENT_AMBULATORY_CARE_PROVIDER_SITE_OTHER): Payer: Medicare Other

## 2017-02-21 ENCOUNTER — Inpatient Hospital Stay: Admission: RE | Admit: 2017-02-21 | Payer: Medicare Other | Source: Ambulatory Visit

## 2017-02-21 DIAGNOSIS — R938 Abnormal findings on diagnostic imaging of other specified body structures: Secondary | ICD-10-CM

## 2017-02-21 DIAGNOSIS — R9389 Abnormal findings on diagnostic imaging of other specified body structures: Secondary | ICD-10-CM

## 2017-02-21 LAB — BASIC METABOLIC PANEL
BUN: 13 mg/dL (ref 6–23)
CALCIUM: 9.4 mg/dL (ref 8.4–10.5)
CO2: 30 mEq/L (ref 19–32)
CREATININE: 0.83 mg/dL (ref 0.40–1.20)
Chloride: 107 mEq/L (ref 96–112)
GFR: 73.98 mL/min (ref >60.00)
GLUCOSE: 82 mg/dL (ref 70–99)
POTASSIUM: 4.1 meq/L (ref 3.5–5.1)
Sodium: 142 mEq/L (ref 135–145)

## 2017-02-26 ENCOUNTER — Other Ambulatory Visit: Payer: Self-pay | Admitting: Internal Medicine

## 2017-02-26 ENCOUNTER — Ambulatory Visit (INDEPENDENT_AMBULATORY_CARE_PROVIDER_SITE_OTHER)
Admission: RE | Admit: 2017-02-26 | Discharge: 2017-02-26 | Disposition: A | Discharge status: Home / Self Care | Payer: Medicare Other | Source: Ambulatory Visit | Attending: Internal Medicine | Admitting: Internal Medicine

## 2017-02-26 DIAGNOSIS — J189 Pneumonia, unspecified organism: Secondary | ICD-10-CM

## 2017-02-26 DIAGNOSIS — R9389 Abnormal findings on diagnostic imaging of other specified body structures: Secondary | ICD-10-CM

## 2017-02-26 DIAGNOSIS — R918 Other nonspecific abnormal finding of lung field: Secondary | ICD-10-CM | POA: Diagnosis not present

## 2017-02-26 DIAGNOSIS — R938 Abnormal findings on diagnostic imaging of other specified body structures: Secondary | ICD-10-CM | POA: Diagnosis not present

## 2017-02-26 MED ORDER — LEVOFLOXACIN 500 MG PO TABS: 500.0000 mg | ORAL_TABLET | Freq: Every day | ORAL | 0 refills | Status: AC

## 2017-02-26 MED ORDER — IOPAMIDOL (ISOVUE-300) INJECTION 61%
80.0000 mL | Freq: Once | INTRAVENOUS | Status: AC | PRN
  Administered 2017-02-26: 80 mL via INTRAVENOUS

## 2017-03-22 ENCOUNTER — Other Ambulatory Visit: Payer: Self-pay | Admitting: Internal Medicine

## 2017-04-04 ENCOUNTER — Encounter: Payer: Self-pay | Admitting: Pulmonary Disease

## 2017-04-04 ENCOUNTER — Ambulatory Visit (INDEPENDENT_AMBULATORY_CARE_PROVIDER_SITE_OTHER): Payer: Medicare Other | Admitting: Pulmonary Disease

## 2017-04-04 ENCOUNTER — Other Ambulatory Visit: Payer: Medicare Other

## 2017-04-04 VITALS — BP 130/80 | HR 62 | Ht 66.0 in | Wt 141.8 lb

## 2017-04-04 DIAGNOSIS — R1310 Dysphagia, unspecified: Secondary | ICD-10-CM | POA: Diagnosis not present

## 2017-04-04 DIAGNOSIS — M1990 Unspecified osteoarthritis, unspecified site: Secondary | ICD-10-CM | POA: Diagnosis not present

## 2017-04-04 DIAGNOSIS — R05 Cough: Secondary | ICD-10-CM

## 2017-04-04 DIAGNOSIS — R918 Other nonspecific abnormal finding of lung field: Secondary | ICD-10-CM | POA: Insufficient documentation

## 2017-04-04 DIAGNOSIS — R499 Unspecified voice and resonance disorder: Secondary | ICD-10-CM

## 2017-04-04 DIAGNOSIS — K219 Gastro-esophageal reflux disease without esophagitis: Secondary | ICD-10-CM | POA: Diagnosis not present

## 2017-04-04 DIAGNOSIS — R059 Cough, unspecified: Secondary | ICD-10-CM

## 2017-04-04 NOTE — Patient Instructions (Addendum)
   Call me if your breathing gets worse or your cough gets worse.  I will see you back after your tests are complete.  Do not refrigerate your sputum specimen and make sure you drop it off to the lab within 4 hours.  TESTS ORDERED: 1. Sputum culture for AFB, Fungus, & Bacteria 2. Serum RAST Panel, Quantitative Immunoglobulin Panel, ANA, Sjogrens Panel, Anti-CCP, and Rheumatoid Factor. 3. HRCT Chest w/o  4. Full PFTs 5. Esophagram

## 2017-04-04 NOTE — Progress Notes (Signed)
Subjective:    Patient ID: Melody [name], female    DOB: [birth year], 61 y.o.   MRN: [id number]  HPI She reports starting in January 2018 she began feeling "bad". She had pain and discomfort in her chest along with a subjective fever. She reports she went to outside hospital [facility]) and underwent a chest X-ray. She was sent home with an antibiotic. She reports her symptoms completely resolved. She did have a cough at the time but doesn't recall what color it was. She reports recently she developed a mild cough recently. She reports her phlegm had some blood tinge to it with predominantly yellow color. She reports she only coughs this up intermittently. She reports she has noticed a weakness to her voice and "raspy" quality starting a couple of months ago. She reports progressively worsening dyspnea. She reports when she was smoking she would get pneumonia yearly. She denies any recent history of recurrent Bronchitis or asthma as a child. Reports dyspnea and symptoms have improved since starting Breo. Does endorse some dysphagia with eating solid food. Denies any subjective fever or chills recently. He endorses diffuse joint pain and arthralgia but denies any joint swelling or erythema.  Review of Systems no rashes or abnormal bruising. No dysuria or hematuria. A pertinent 14 point review of systems is negative except as per the history of presenting illness.  Allergies  Allergen Reactions  . Doxycycline Hyclate Itching  . Nabumetone Other (See Comments)    Headache  . Sulfa Antibiotics Other (See Comments)    Reaction is unknown  . Tetracycline Hcl Itching  . Tramadol Hcl Other (See Comments)    Reaction is unknown    Current Outpatient Prescriptions on File Prior to Visit  Medication Sig Dispense Refill  . Albuterol Sulfate (PROAIR RESPICLICK) 409 (90 BASE) MCG/ACT AEPB Inhale 1-2 puffs into the lungs 4 (four) times daily as needed. 1 each 11  . ALPRAZolam (XANAX) 1 MG tablet Take 1  mg by mouth 4 (four) times daily.     Marland Kitchen amphetamine-dextroamphetamine (ADDERALL) 30 MG tablet Take 15-30 mg by mouth 2 (two) times daily as needed (For focus.). Takes one tablet in the morning, may take one-half tablet in the afternoon if needed    . cholecalciferol (VITAMIN D) 1000 units tablet Take 1,000 Units by mouth once a week.    . cyanocobalamin (,VITAMIN B-12,) 1000 MCG/ML injection INJECT 1 ML INTO THE MUSCLE EVERY 14 DAYS. 2 mL 11  . fluticasone (FLONASE) 50 MCG/ACT nasal spray USE 2 SPRAYS IN BOTH NOSTRILS DAILY. 16 g 11  . fluticasone furoate-vilanterol (BREO ELLIPTA) 100-25 MCG/INH AEPB Inhale 1 puff into the lungs daily. 1 each 5  . levocetirizine (XYZAL) 5 MG tablet TAKE 1 TABLET BY MOUTH EVERY EVENING. 30 tablet 5  . levothyroxine (SYNTHROID, LEVOTHROID) 50 MCG tablet Take 1 tablet (50 mcg total) by mouth daily before breakfast. 30 tablet 11  . oxyCODONE-acetaminophen (PERCOCET/ROXICET) 5-325 MG tablet Take 1 tablet by mouth 2 (two) times daily as needed for severe pain. 60 tablet 0  . pantoprazole (PROTONIX) 40 MG tablet TAKE 1 TABLET BY MOUTH EVERY DAY 30 tablet 5  . PROAIR HFA 108 (90 Base) MCG/ACT inhaler INHALE 2 PUFFS BY MOUTH EVERY FOUR HOURS AS NEEDED 9 g 0  . QUEtiapine (SEROQUEL) 25 MG tablet Take 25 mg by mouth at bedtime.     . triamcinolone ointment (KENALOG) 0.1 % Apply 1 application topically 2 (two) times daily. 80 g 3  No current facility-administered medications on file prior to visit.     Past Medical History:  Diagnosis Date  . Anxiety   . Chronic back pain   . Chronic neck pain   . Depression    bipolar- Dr [name]  . Fatigue   . Fibromyalgia   . GERD (gastroesophageal reflux disease)   . Headache   . Hypothyroidism   . Low back pain    Dr Arnoldo Morale  . Osteoarthritis   . Pneumonia   . Sinus congestion   . Thyroid disease    hypothyroidsm  . Vertigo   . Vitamin B12 deficiency     Past Surgical History:  Procedure Laterality Date  . ABDOMINAL  HYSTERECTOMY N/A 01/10/2016   Procedure: HYSTERECTOMY ABDOMINAL;  Surgeon: Nunzio Cobbs, MD;  Location: [facility];  Service: Gynecology;  Laterality: N/A;  . ABDOMINAL SACROCOLPOPEXY N/A 01/10/2016   Procedure: ABDOMINO SACROCOLPOPEXY ;  Surgeon: Nunzio Cobbs, MD;  Location: [facility];  Service: Gynecology;  Laterality: N/A;  . ANTERIOR AND POSTERIOR REPAIR N/A 01/10/2016   Procedure:  POSTERIOR REPAIR (RECTOCELE);  Surgeon: Nunzio Cobbs, MD;  Location: [facility];  Service: Gynecology;  Laterality: N/A;  . BLADDER SUSPENSION N/A 01/10/2016   Procedure: TRANSVAGINAL TAPE (TVT) PROCEDURE exact midurethral sling;  Surgeon: Nunzio Cobbs, MD;  Location: [facility];  Service: Gynecology;  Laterality: N/A;  . CERVICAL LAMINECTOMY  2001 and 2995   Dr Arnoldo Morale  . CYSTO N/A 01/10/2016   Procedure: CYSTOSCOPY;  Surgeon: Nunzio Cobbs, MD;  Location: [facility];  Service: Gynecology;  Laterality: N/A;  . SALPINGOOPHORECTOMY Bilateral 01/10/2016   Procedure: BILATERAL SALPINGO OOPHORECTOMY;  Surgeon: Nunzio Cobbs, MD;  Location: [facility];  Service: Gynecology;  Laterality: Bilateral;  . TUBAL LIGATION  1980    Family History  Problem Relation Age of Onset  . Heart attack Father        dec heart disease age 51  . Hypertension Father   . Lung disease Father   . Cancer Mother        dec stomach ca--age 67  . Breast cancer Cousin   . Arthritis Other   . Breast cancer Paternal Aunt     Social History   Social History  . Marital status: Legally Separated    Spouse name: N/A  . Number of children: N/A  . Years of education: N/A   Social History Main Topics  . Smoking status: Former Smoker    Packs/day: 1.00    Years: 35.00    Types: Cigarettes    Start date: 11/21/1962    Quit date: 05/06/2011  . Smokeless tobacco: Never Used     Comment: stopped periodically - peak rate of 1 ppd  . Alcohol use No  . Drug use: No  . Sexual activity: Yes    Birth  control/ protection: Post-menopausal, Surgical     Comment: Tubal/Hyst/BSO   Other Topics Concern  . None   Social History Narrative   [facility] (04/04/17):   Originally from [place], [place]. 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 Recently had a persian cat that passed. No bird exposure. She denies any indoor plants currently. She does use wood burning for heat. She reports there is water getting under her foundation. She has mold in her home that she reports is in the 2nd  floor of her home. She has recently started seeing mold under her bed. She reports her home smells musty & damp. She has always lived in [place].       Objective:   Physical Exam BP 130/80 (BP Location: Right Arm, Patient Position: Sitting, Cuff Size: Normal)   Pulse 62   Ht 5\' 6"  (1.676 m)   Wt 141 lb 12.8 oz (64.3 kg)   LMP 10/29/2006 (Approximate)   SpO2 98%   BMI 22.89 kg/m  General:  Awake. Alert. No acute distress. Thin Caucasian female. Integument:  Warm & dry. No rash on exposed skin. No bruising. Extremities:  No cyanosis or clubbing.  Lymphatics:  No appreciated cervical or supraclavicular lymphadenoapthy. HEENT:  Moist mucus membranes. No oral ulcers. No scleral injection or icterus. Mild bilateral nasal turbinate swelling. Cardiovascular:  Regular rate and rhythm. No edema. No appreciable JVD.  Pulmonary:  Good aeration & clear to auscultation bilaterally. Symmetric chest wall expansion. No accessory muscle use on room air. Abdomen: Soft. Normal bowel sounds. Nondistended. Grossly nontender. Musculoskeletal:  Normal bulk and tone. Hand grip strength 5/5 bilaterally. No joint deformity or effusion appreciated. Neurological:  CN 2-12 grossly in tact. No meningismus. Moving all 4 extremities equally. Symmetric brachioradialis deep tendon reflexes. Psychiatric:  Mood and affect congruent. Speech normal rhythm, rate & tone.   IMAGING CT CHEST W/ CONTRAST  02/26/17 (personally reviewed by me):  No pleural effusion or thickening. No pathologic mediastinal adenopathy. No pericardial effusion. Patchy nodularity present most prevalently within posterior segment right upper lobe, lateral segment of right middle lobe, and right lower lobe.  LABS 08/17/16 ANA: Negative Rheumatoid factor:  <14    Assessment & Plan:  61 y.o. female with multiple medical problems including be predominantly right-sided nodules seen on her CT imaging above as well as dyspnea, cough, GERD, and chronic seasonal allergic rhinitis. The right side predominance of her CT findings would suggest the possibility of uncontrolled reflux versus atypical infection possibly even fungal in etiology given her home environment. I did recommend removing herself from her home to an alternative location as well as professional assessment and remediation if necessary. I warned the patient against trying to treat mold within her home herself due to the risks of further exposure. I instructed the patient contact my office if she had any new breathing problems or questions before her next appointment  1. Cough: Question possible postnasal drainage. Checking sputum culture for AFB, fungus, and bacteria. 2. Multiple lung nodules on CT: Checking serum workup with ANA, Sjogren's panel, anti-CCP, and rheumatoid factor. Also checking sputum culture. 3. Change in voice: Question possible reflux or secondary to postnasal drainage. Checking esophagogram. May require ENT evaluation. 4. GERD with dysphagia: Continuing on Protonix. Checking esophagogram/barium swallow. 5. Arthritis: Checking autoimmune workup as above. 6. History of tobacco use: Checking full pulmonary function testing. Continuing patient on Breo for now. 7. Bronchiectasis: Checking high-resolution CT chest without contrast. 8. Chronic seasonal allergic rhinitis: Checking serum RAST panel. Patient continuing on Flonase and Xyzal. 9. Health  maintenance:  Status post influenza vaccine October 2017, Prevnar October 2016, & Tdap October 2016. 10. Follow-up: Return to clinic in 6 weeks or sooner if needed.  Sonia Baller Ashok Cordia, M.D. [facility] Pager:  [phone] After 3pm or if no response, call [phone] 2:30 PM 04/04/17

## 2017-04-05 LAB — CYCLIC CITRUL PEPTIDE ANTIBODY, IGG: Cyclic Citrullin Peptide Ab: <16 Units

## 2017-04-05 LAB — RESPIRATORY ALLERGY PROFILE REGION II ~~LOC~~
Allergen, A. alternata, m6: <0.1 kU/L
Allergen, Cottonwood, t14: <0.1 kU/L
Allergen, D pternoyssinus,d7: <0.1 kU/L
Allergen, Oak,t7: <0.1 kU/L
Cat Dander: <0.1 kU/L
D. farinae: <0.1 kU/L
Dog Dander: <0.1 kU/L
Elm IgE: <0.1 kU/L
IGE (IMMUNOGLOBULIN E), SERUM: 15 kU/L (ref <115)
Johnson Grass: <0.1 kU/L
Pecan/Hickory Tree IgE: <0.1 kU/L
Timothy Grass: <0.1 kU/L

## 2017-04-05 LAB — SJOGREN'S SYNDROME ANTIBODS(SSA + SSB)
SSA (RO) (ENA) ANTIBODY, IGG: NEGATIVE
SSB (LA) (ENA) ANTIBODY, IGG: NEGATIVE

## 2017-04-05 LAB — ANA: ANA: POSITIVE — AB

## 2017-04-05 LAB — IGG, IGA, IGM
IGA: 135 mg/dL (ref 81–463)
IgG (Immunoglobin G), Serum: 1066 mg/dL (ref 694–1618)
IgM, Serum: 153 mg/dL (ref 48–271)

## 2017-04-05 LAB — ANTI-NUCLEAR AB-TITER (ANA TITER)

## 2017-04-05 LAB — RHEUMATOID FACTOR: Rhuematoid fact SerPl-aCnc: <14 IU/mL (ref <14)

## 2017-04-08 ENCOUNTER — Ambulatory Visit (HOSPITAL_COMMUNITY)
Admission: RE | Admit: 2017-04-08 | Discharge: 2017-04-08 | Disposition: A | Discharge status: Home / Self Care | Payer: Medicare Other | Source: Ambulatory Visit | Attending: Pulmonary Disease | Admitting: Pulmonary Disease

## 2017-04-08 ENCOUNTER — Telehealth: Payer: Self-pay | Admitting: Pulmonary Disease

## 2017-04-08 DIAGNOSIS — R1310 Dysphagia, unspecified: Secondary | ICD-10-CM | POA: Diagnosis not present

## 2017-04-08 NOTE — Telephone Encounter (Signed)
Spoke with [place]. She had a question about the pt's DG Esophogram order. Order was placed incorrectly. This has been corrected. Nothing further was needed.

## 2017-04-08 NOTE — Telephone Encounter (Signed)
I have called Melody [name] at [facility] she has no opening as of now on 06/13 she has some tomorrow. I have attempted to call the patient but her voicemail is not set up yet. Will try again later.

## 2017-04-08 NOTE — Telephone Encounter (Signed)
Pt called again, and I let her know that there were no opening for  CT's on 6/13 and she is going to keep what she has.Hillery Hunter

## 2017-04-09 NOTE — Progress Notes (Signed)
ATC, unable to leave voicemail as it has not been set up.

## 2017-04-10 ENCOUNTER — Ambulatory Visit (HOSPITAL_COMMUNITY): Payer: Medicare Other

## 2017-04-10 NOTE — Progress Notes (Signed)
Contacted patient. Unable to leave voicemail as box has not been set up.

## 2017-04-11 ENCOUNTER — Other Ambulatory Visit: Payer: Self-pay | Admitting: Internal Medicine

## 2017-04-11 NOTE — Progress Notes (Signed)
Spoke with patient and informed her of results. Pt verbalized understanding and did not have any questions. Nothing further is needed.

## 2017-04-19 ENCOUNTER — Ambulatory Visit (HOSPITAL_COMMUNITY)
Admission: RE | Admit: 2017-04-19 | Discharge: 2017-04-19 | Disposition: A | Discharge status: Home / Self Care | Payer: Medicare Other | Source: Ambulatory Visit | Attending: Pulmonary Disease | Admitting: Pulmonary Disease

## 2017-04-19 DIAGNOSIS — R918 Other nonspecific abnormal finding of lung field: Secondary | ICD-10-CM | POA: Insufficient documentation

## 2017-04-19 DIAGNOSIS — I700 Atherosclerosis of aorta: Secondary | ICD-10-CM | POA: Diagnosis not present

## 2017-04-22 NOTE — Progress Notes (Signed)
Spoke with patient and informed her of results. Pt verbalized understanding and did not have any questions. Nothing further is needed.

## 2017-05-06 ENCOUNTER — Telehealth: Payer: Self-pay | Admitting: Pulmonary Disease

## 2017-05-06 ENCOUNTER — Telehealth: Payer: Self-pay | Admitting: Internal Medicine

## 2017-05-06 NOTE — Telephone Encounter (Signed)
ATC pt x1 phone went straight to vm, message states vm has not been set up. Will try to call pt back

## 2017-05-06 NOTE — Telephone Encounter (Signed)
Pt called in and had 2 CT Scan, they found pneumonia in there.  She wants to know if if Plot will look at ct Scan and call her in antibiotics for it asap   Pharmacy [facility]

## 2017-05-07 NOTE — Telephone Encounter (Signed)
Please advise, patient also called Pulmonology for the same reason(they ordered CT)

## 2017-05-07 NOTE — Telephone Encounter (Signed)
I have spoken with pt, and made her aware of JN's recommendations. [name] states she is not able to produce any mucus, as her cough is dry. Pt states she has had these symptoms for 5 months and is in need of some relief.   JN please advise. Thanks.

## 2017-05-07 NOTE — Telephone Encounter (Signed)
I looked at the CT report. Pls keep OV w/Dr Ashok Cordia and follow his recommendations Thx

## 2017-05-07 NOTE — Telephone Encounter (Signed)
Please let patient know that if she cannot produce a sputum sample we may need to perform a bronchoscopy which is a procedure that would allow me to go directly into her lungs and obtain a culture. Without knowing specifically what is causing the infection and her cough I cannot prescribe an antibiotic regimen as these organisms can be very difficult to treat and require 6 months to nearly 2 years of antibiotics to treat and fully eradicate. If she wishes to discuss the procedure further and person or over the phone and let me know.

## 2017-05-07 NOTE — Telephone Encounter (Signed)
Spoke with patient. She stated she has been having a chest burning sensation since January. She also has a non productive cough with shortness of breath. She thinks she has an infection and is requesting antibiotics. Advised pt I could not send in anything until JN received her message.   She also mentioned that she had called Dr. Judeen Hammans office for the same reason. She is waiting to heard back from them as well.   She wishes to use [facility] in [place].   JN, please advise. Thanks!

## 2017-05-07 NOTE — Telephone Encounter (Signed)
Before we use an antibiotic we need a sputum culture, as planned at her last appointment, for AFB, Fungus, & Bacteria. Thank you.

## 2017-05-08 NOTE — Telephone Encounter (Signed)
Unable to reach pt, no VM set up

## 2017-05-08 NOTE — Telephone Encounter (Addendum)
ATC pt-unable to leave vm due to mailbox not being setup.  wcb

## 2017-05-09 NOTE — Telephone Encounter (Signed)
ATC x2 to pt, rung and then  When to vm, vm recording states vm has not been set up yet

## 2017-05-09 NOTE — Telephone Encounter (Signed)
Patient contacted regarding concerns about bronchoscopy and risks of procedure. Described the need for lavage and transbronchial biopsies to help identify potential atypical infectious organisms. Discussed the risks of the procedure including bleeding, infection, pneumothorax, vocal cord injury, medication allergy, and potentially death. We also discussed her ongoing cough which remains largely nonproductive. I recommended trying twice daily guaifenesin (generic Mucinex) along with plenty of liquids to help produce any mucus. If this is ineffective by the time I see her on 7/24 we could also consider adding a flutter valve. Ultimately, I stressed the need for elimination of any potential fungal infestation  which could be thecause for her ongoing symptoms and potentially the source of her presumed infectious organism. We discussed the fact that short courses of antibiotics aren't effective for the majority of these infectious organisms and ultimately treatment can require 6 months to even as long as 1.5 years. I instructed the patient contact my office if she had any questions or concerns before her next appointment.

## 2017-05-09 NOTE — Telephone Encounter (Signed)
Pt.notified

## 2017-05-09 NOTE — Telephone Encounter (Signed)
JN  Please Advise-  Spoke with pt and informed her of your message. Patient states she would like to discuss further over the phone. She wanted a call back if possible tonight, informed her you were in clinic and if she could provide [place] with some time when she would be available. She wanted [place] to send a message to you to see if you were willing to call her today since she is free now or let her know when you would be free to call her.

## 2017-05-21 ENCOUNTER — Telehealth: Payer: Self-pay | Admitting: Pulmonary Disease

## 2017-05-21 ENCOUNTER — Ambulatory Visit (INDEPENDENT_AMBULATORY_CARE_PROVIDER_SITE_OTHER): Payer: Medicare Other | Admitting: Pulmonary Disease

## 2017-05-21 ENCOUNTER — Encounter: Payer: Self-pay | Admitting: Internal Medicine

## 2017-05-21 ENCOUNTER — Ambulatory Visit (INDEPENDENT_AMBULATORY_CARE_PROVIDER_SITE_OTHER): Payer: Medicare Other | Admitting: Internal Medicine

## 2017-05-21 ENCOUNTER — Encounter: Payer: Self-pay | Admitting: Pulmonary Disease

## 2017-05-21 VITALS — BP 106/70 | HR 87 | Ht 66.0 in | Wt 142.0 lb

## 2017-05-21 DIAGNOSIS — R918 Other nonspecific abnormal finding of lung field: Secondary | ICD-10-CM | POA: Diagnosis not present

## 2017-05-21 DIAGNOSIS — M25561 Pain in right knee: Secondary | ICD-10-CM | POA: Diagnosis not present

## 2017-05-21 DIAGNOSIS — M7051 Other bursitis of knee, right knee: Secondary | ICD-10-CM | POA: Diagnosis not present

## 2017-05-21 DIAGNOSIS — M25569 Pain in unspecified knee: Secondary | ICD-10-CM

## 2017-05-21 DIAGNOSIS — G8929 Other chronic pain: Secondary | ICD-10-CM | POA: Diagnosis not present

## 2017-05-21 DIAGNOSIS — F411 Generalized anxiety disorder: Secondary | ICD-10-CM

## 2017-05-21 DIAGNOSIS — J479 Bronchiectasis, uncomplicated: Secondary | ICD-10-CM

## 2017-05-21 DIAGNOSIS — M719 Bursopathy, unspecified: Secondary | ICD-10-CM | POA: Insufficient documentation

## 2017-05-21 DIAGNOSIS — R059 Cough, unspecified: Secondary | ICD-10-CM

## 2017-05-21 DIAGNOSIS — M5440 Lumbago with sciatica, unspecified side: Secondary | ICD-10-CM

## 2017-05-21 DIAGNOSIS — R05 Cough: Secondary | ICD-10-CM

## 2017-05-21 LAB — PULMONARY FUNCTION TEST
DL/VA % pred: 107 %
DL/VA: 5.44 ml/min/mmHg/L
DLCO COR % PRED: 98 %
DLCO COR: 26.46 ml/min/mmHg
DLCO UNC % PRED: 93 %
DLCO UNC: 25.15 ml/min/mmHg
FEF 25-75 POST: 1.4 L/s
FEF 25-75 PRE: 1.36 L/s
FEF2575-%Change-Post: 2 %
FEF2575-%PRED-POST: 59 %
FEF2575-%PRED-PRE: 57 %
FEV1-%Change-Post: 1 %
FEV1-%PRED-POST: 80 %
FEV1-%Pred-Pre: 79 %
FEV1-PRE: 2.14 L
FEV1-Post: 2.17 L
FEV1FVC-%CHANGE-POST: 0 %
FEV1FVC-%PRED-PRE: 88 %
FEV6-%CHANGE-POST: 0 %
FEV6-%PRED-POST: 92 %
FEV6-%Pred-Pre: 92 %
FEV6-Post: 3.11 L
FEV6-Pre: 3.09 L
FEV6FVC-%CHANGE-POST: 0 %
FEV6FVC-%Pred-Post: 103 %
FEV6FVC-%Pred-Pre: 102 %
FVC-%Change-Post: 0 %
FVC-%Pred-Post: 89 %
FVC-%Pred-Pre: 89 %
FVC-Post: 3.12 L
FVC-Pre: 3.1 L
POST FEV1/FVC RATIO: 69 %
PRE FEV1/FVC RATIO: 69 %
Post FEV6/FVC ratio: 100 %
Pre FEV6/FVC Ratio: 100 %
RV % PRED: 108 %
RV: 2.33 L
TLC % pred: 99 %
TLC: 5.34 L

## 2017-05-21 MED ORDER — METHYLPREDNISOLONE ACETATE 40 MG/ML IJ SUSP
80.0000 mg | Freq: Once | INTRAMUSCULAR | Status: AC
  Administered 2017-05-21: 80 mg via INTRA_ARTICULAR

## 2017-05-21 MED ORDER — PANTOPRAZOLE SODIUM 40 MG PO TBEC: 40.0000 mg | DELAYED_RELEASE_TABLET | Freq: Every day | ORAL | 11 refills | Status: DC

## 2017-05-21 MED ORDER — OXYCODONE-ACETAMINOPHEN 5-325 MG PO TABS: 1.0000 | ORAL_TABLET | Freq: Two times a day (BID) | ORAL | 0 refills | Status: DC | PRN

## 2017-05-21 MED ORDER — ALBUTEROL SULFATE HFA 108 (90 BASE) MCG/ACT IN AERS: 2.0000 | INHALATION_SPRAY | Freq: Four times a day (QID) | RESPIRATORY_TRACT | 6 refills | Status: DC | PRN

## 2017-05-21 MED ORDER — FLUTTER DEVI: 0 refills | Status: DC

## 2017-05-21 NOTE — Assessment & Plan Note (Signed)
anserina bursitis Will inject

## 2017-05-21 NOTE — Telephone Encounter (Signed)
We did discuss this but I instructed her on proper technique with her Flonase which I would like her to try first. J.

## 2017-05-21 NOTE — Telephone Encounter (Signed)
Spoke with patient. She is aware of JN's recs. She verbalized understanding. Nothing else needed at time of call.

## 2017-05-21 NOTE — Patient Instructions (Signed)
Postprocedure instructions :    A Band-Aid should be left on for 12 hours. Injection therapy is not a cure itself. It is used in conjunction with other modalities. You can use nonsteroidal anti-inflammatories like ibuprofen , hot and cold compresses. Rest is recommended in the next 24 hours. You need to report immediately  if fever, chills or any signs of infection develop. 

## 2017-05-21 NOTE — Telephone Encounter (Signed)
Spoke with the pt  She states that she thought that JN was going to rec a new nasal spray other than flonase, but no mention on AVS  I see mention of changing her proair to ventolin, but nothing about a nasal spray  Please advise, thanks!

## 2017-05-21 NOTE — Assessment & Plan Note (Signed)
Percocet prn 

## 2017-05-21 NOTE — Assessment & Plan Note (Signed)
Knee OA Will inject

## 2017-05-21 NOTE — Progress Notes (Signed)
Subjective:    Patient ID: Melody [name], female    DOB: 06-16-1956, 61 y.o.   MRN: [id number]  C.C.:  Follow-up for Cough, Voice Changes, Bronchiectasis, Multiple Lung Nodules, GERD w/ Dysphagia, & Chronic Seasonal Allergic Rhinitis.   HPI Cough:  Multiple possible etiologies. Suspect possible underlying atypical infectious process given high-resolution CT findings. She reports minimal cough that is mostly nonproductive. She feels like the nebulizer treatment today during her spirometry was very effective.   Voice Changes:  She reports her voice is still raspy. Previously lost her voice for only one day.   Bronchiectasis: No evidence of autoimmune disease or immunodeficiency as a cause. Mild airway obstruction seen on spirometry. Patient currently on Breo. No hemoptysis. No wheezing. Patient reports feeling "jittery" and abnormal with ProAir. She reports she did not experience this today using our inhaler during her test. She feels that the Memory Dance has helped some with her dyspnea.   Multiple Lung Nodules:  Likely secondary to atypical infection. Patient and able to produce sputum for culture. Seen on HRCT and appears as though she has tree-in-bud opacities.  GERD w/ Dysphagia: No evidence of reflux or mucosal abnormality on esophagram. Prescribed Protonix. No reflux or morning brash water taste.   Chronic Seasonal Allergic Rhinitis: Prescribed Flonase and Xyzal. No obvious allergen on serum testing. Still has sinus congestion & drainage.   Review of Systems Chronic hot & cold chills. No fever or sweats. No chest pain or pressure recently. Previously was having fluttering. No rashes or bruising.   Allergies  Allergen Reactions  . Doxycycline Hyclate Itching  . Nabumetone Other (See Comments)    Headache  . Sulfa Antibiotics Other (See Comments)    Reaction is unknown  . Tetracycline Hcl Itching  . Tramadol Hcl Other (See Comments)    Reaction is unknown    Current Outpatient  Prescriptions on File Prior to Visit  Medication Sig Dispense Refill  . Albuterol Sulfate (PROAIR RESPICLICK) 470 (90 BASE) MCG/ACT AEPB Inhale 1-2 puffs into the lungs 4 (four) times daily as needed. 1 each 11  . ALPRAZolam (XANAX) 1 MG tablet Take 1 mg by mouth 4 (four) times daily.     Marland Kitchen amphetamine-dextroamphetamine (ADDERALL) 30 MG tablet Take 15-30 mg by mouth 2 (two) times daily as needed (For focus.). Takes one tablet in the morning, may take one-half tablet in the afternoon if needed    . cholecalciferol (VITAMIN D) 1000 units tablet Take 1,000 Units by mouth once a week.    . cyanocobalamin (,VITAMIN B-12,) 1000 MCG/ML injection INJECT 1 ML INTO THE MUSCLE EVERY 14 DAYS. 2 mL 11  . fluticasone (FLONASE) 50 MCG/ACT nasal spray USE 2 SPRAYS IN BOTH NOSTRILS DAILY. 16 g 11  . fluticasone furoate-vilanterol (BREO ELLIPTA) 100-25 MCG/INH AEPB Inhale 1 puff into the lungs daily. 1 each 5  . levocetirizine (XYZAL) 5 MG tablet TAKE 1 TABLET BY MOUTH EVERY EVENING. 30 tablet 5  . levothyroxine (SYNTHROID, LEVOTHROID) 50 MCG tablet Take 1 tablet (50 mcg total) by mouth daily before breakfast. 30 tablet 11  . oxyCODONE-acetaminophen (PERCOCET/ROXICET) 5-325 MG tablet Take 1 tablet by mouth 2 (two) times daily as needed for severe pain. 60 tablet 0  . pantoprazole (PROTONIX) 40 MG tablet TAKE 1 TABLET BY MOUTH EVERY DAY 30 tablet 5  . PROAIR HFA 108 (90 Base) MCG/ACT inhaler INHALE 2 PUFFS BY MOUTH EVERY FOUR HOURS AS NEEDED 9 g 0  . QUEtiapine (SEROQUEL) 25 MG tablet Take 25  mg by mouth at bedtime.     . triamcinolone ointment (KENALOG) 0.1 % Apply 1 application topically 2 (two) times daily. 80 g 3   No current facility-administered medications on file prior to visit.     Past Medical History:  Diagnosis Date  . Anxiety   . Chronic back pain   . Chronic neck pain   . Depression    bipolar- Dr [name]  . Fatigue   . Fibromyalgia   . GERD (gastroesophageal reflux disease)   . Headache   .  Hypothyroidism   . Low back pain    Dr Arnoldo Morale  . Osteoarthritis   . Pneumonia   . Sinus congestion   . Thyroid disease    hypothyroidsm  . Vertigo   . Vitamin B12 deficiency     Past Surgical History:  Procedure Laterality Date  . ABDOMINAL HYSTERECTOMY N/A 01/10/2016   Procedure: HYSTERECTOMY ABDOMINAL;  Surgeon: Nunzio Cobbs, MD;  Location: [facility];  Service: Gynecology;  Laterality: N/A;  . ABDOMINAL SACROCOLPOPEXY N/A 01/10/2016   Procedure: ABDOMINO SACROCOLPOPEXY ;  Surgeon: Nunzio Cobbs, MD;  Location: [facility];  Service: Gynecology;  Laterality: N/A;  . ANTERIOR AND POSTERIOR REPAIR N/A 01/10/2016   Procedure:  POSTERIOR REPAIR (RECTOCELE);  Surgeon: Nunzio Cobbs, MD;  Location: [facility];  Service: Gynecology;  Laterality: N/A;  . BLADDER SUSPENSION N/A 01/10/2016   Procedure: TRANSVAGINAL TAPE (TVT) PROCEDURE exact midurethral sling;  Surgeon: Nunzio Cobbs, MD;  Location: [facility];  Service: Gynecology;  Laterality: N/A;  . CERVICAL LAMINECTOMY  2001 and 2995   Dr Arnoldo Morale  . CYSTO N/A 01/10/2016   Procedure: CYSTOSCOPY;  Surgeon: Nunzio Cobbs, MD;  Location: [facility];  Service: Gynecology;  Laterality: N/A;  . SALPINGOOPHORECTOMY Bilateral 01/10/2016   Procedure: BILATERAL SALPINGO OOPHORECTOMY;  Surgeon: Nunzio Cobbs, MD;  Location: [facility];  Service: Gynecology;  Laterality: Bilateral;  . TUBAL LIGATION  1980    Family History  Problem Relation Age of Onset  . Heart attack Father        dec heart disease age 51  . Hypertension Father   . Lung disease Father   . Cancer Mother        dec stomach ca--age 7  . Breast cancer Cousin   . Arthritis Other   . Breast cancer Paternal Aunt     Social History   Social History  . Marital status: Legally Separated    Spouse name: N/A  . Number of children: N/A  . Years of education: N/A   Social History Main Topics  . Smoking status: Former Smoker     Packs/day: 1.00    Years: 35.00    Types: Cigarettes    Start date: 11/21/1962    Quit date: 05/06/2011  . Smokeless tobacco: Never Used     Comment: stopped periodically - peak rate of 1 ppd  . Alcohol use No  . Drug use: No  . Sexual activity: Yes    Birth control/ protection: Post-menopausal, Surgical     Comment: Tubal/Hyst/BSO   Other Topics Concern  . None   Social History Narrative   [facility] (04/04/17):   Originally from [place], [place]. 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 Recently had a persian cat that passed. No bird exposure. She denies any indoor plants currently. She  does use wood burning for heat. She reports there is water getting under her foundation. She has mold in her home that she reports is in the 2nd floor of her home. She has recently started seeing mold under her bed. She reports her home smells musty & damp. She has always lived in [place].       Objective:   Physical Exam BP 106/70 (BP Location: Right Arm, Patient Position: Sitting, Cuff Size: Normal)   Pulse 87   Ht 5\' 6"  (1.676 m)   Wt 142 lb (64.4 kg)   LMP 10/29/2006 (Approximate)   SpO2 93%   BMI 22.92 kg/m   General:  Awake. Alert. No acute distress. Thin, Caucasian female. Integument:  Warm & dry. No rash on exposed skin. No bruising on exposed skin. Extremities:  No cyanosis or clubbing.  HEENT:  Moist mucus membranes. No nasal turbinate swelling. No oral ulcers. Cardiovascular:  Regular rate. No edema. Normal S1 & S2.  Pulmonary:  Minimal squeak right lung base. Otherwise good aeration. Clear to auscultation. Abdomen: Soft. Normal bowel sounds. Nondistended. Musculoskeletal:  Normal bulk and tone. No joint deformity or effusion appreciated.  PFT 05/21/17: FVC 3.10 L (89%) FEV1 2.14 L (79%) FEV1/FVC 0.69 FEF 25-75 1.36 L (57%) negative bronchodilator response TLC 5.34 L (99%) RV 108% ERV 72% DLCO corrected 98%  IMAGING HRCT CHEST W/O  04/19/17 (personally reviewed by me):  No intralobular septal thickening, groundglass opacities, or clinical changes to suggest interstitial lung disease. Mild to moderate degree of bronchiectasis worse on the right compared to the left and worse also in the lower lobes compared to the upper lobes. There is some associated tree-in-bud opacities again more prevalent on the right compared with the left particularly in the right middle and lower lobes. No pathologically enlarged mediastinal adenopathy. No pleural effusion. No pericardial effusion. Nodule seemed to increase in size since CT imaging in May.  ESOPHAGRAM/BARIUM SWALLOW 04/08/17 (per radiologist):  Normal esophagram.  CT CHEST W/ CONTRAST 02/26/17 (previously reviewed by me):  No pleural effusion or thickening. No pathologic mediastinal adenopathy. No pericardial effusion. Patchy nodularity present most prevalently within posterior segment right upper lobe, lateral segment of right middle lobe, and right lower lobe.  LABS 04/04/17 IgG: 1066 IgA: 135 IgM: 153 IgE: 15 RAST panel: Negative ANA:  1:80 RF:  <14 Anti-CCP:  <16 SSA:  <1.0 SSB:  <1.0  08/17/16 ANA: Negative Rheumatoid factor:  <14    Assessment & Plan:  60 y.o. female with multiple lung nodules along with bronchiectasis. Nodules likely represent atypical infectious process and are likely the cause of her ongoing cough. Overall her reflux seems to be uncontrolled. We did discuss her technique with her intranasal medications which hopefully will help with her postnasal drainage. I instructed the patient contact my office next week to let me know if she was able to produce a sputum sample or not.  1. Cough: Suspect secondary to underlying bronchiectasis and atypical infection. 2. Voice changes: No clear etiology. Holding off on ENT consult. 3. Bronchiectasis: Continuing patient on Breo. Given flutter valve today to assist with airway clearance. Checking sputum culture as  previously planned. Switching from [employer] to Ventolin given adverse reaction to [employer]. 4. Multiple lung nodules: Attempting to obtain sputum culture with use of flutter valve. If this is ineffective I urged patient to consider bronchoscopy with lavage and transbronchial biopsy. Repeat CT chest without contrast in 2 months. 5. GERD: Controlled with Protonix. No new medications. 6.  Chronic seasonal allergic rhinitis: Continuing Xyzal. Recommended proper technique with Flonase nasal spray. 7. Health maintenance:  Status post influenza vaccine October 2017, Prevnar October 2016, & Tdap October 2016. 8. Follow-up: Return to clinic in 3 months or sooner if needed.  Sonia Baller Ashok Cordia, M.D. [facility] Pager:  [phone] After 3pm or if no response, call [phone] 2:13 PM 05/21/17

## 2017-05-21 NOTE — Assessment & Plan Note (Signed)
Dr Ammie Dalton note was reviewed/discussed

## 2017-05-21 NOTE — Assessment & Plan Note (Signed)
Seroquel, Xanax prn 

## 2017-05-21 NOTE — Progress Notes (Signed)
Subjective:  Patient ID: Melody [name], female    DOB: 25-May-1956  Age: 61 y.o. MRN: [id number]  CC: No chief complaint on file.   HPI DECIE VERNE presents for cough, LBP, anxiety f/u  Outpatient Medications Prior to Visit  Medication Sig Dispense Refill  . albuterol (PROVENTIL HFA;VENTOLIN HFA) 108 (90 Base) MCG/ACT inhaler Inhale 2 puffs into the lungs every 6 (six) hours as needed for wheezing or shortness of breath. 1 Inhaler 6  . ALPRAZolam (XANAX) 1 MG tablet Take 1 mg by mouth 4 (four) times daily.     Marland Kitchen amphetamine-dextroamphetamine (ADDERALL) 30 MG tablet Take 15-30 mg by mouth 2 (two) times daily as needed (For focus.). Takes one tablet in the morning, may take one-half tablet in the afternoon if needed    . cholecalciferol (VITAMIN D) 1000 units tablet Take 1,000 Units by mouth once a week.    . cyanocobalamin (,VITAMIN B-12,) 1000 MCG/ML injection INJECT 1 ML INTO THE MUSCLE EVERY 14 DAYS. 2 mL 11  . fluticasone (FLONASE) 50 MCG/ACT nasal spray USE 2 SPRAYS IN BOTH NOSTRILS DAILY. 16 g 11  . fluticasone furoate-vilanterol (BREO ELLIPTA) 100-25 MCG/INH AEPB Inhale 1 puff into the lungs daily. 1 each 5  . levocetirizine (XYZAL) 5 MG tablet TAKE 1 TABLET BY MOUTH EVERY EVENING. 30 tablet 5  . levothyroxine (SYNTHROID, LEVOTHROID) 50 MCG tablet Take 1 tablet (50 mcg total) by mouth daily before breakfast. 30 tablet 11  . oxyCODONE-acetaminophen (PERCOCET/ROXICET) 5-325 MG tablet Take 1 tablet by mouth 2 (two) times daily as needed for severe pain. 60 tablet 0  . pantoprazole (PROTONIX) 40 MG tablet TAKE 1 TABLET BY MOUTH EVERY DAY 30 tablet 5  . QUEtiapine (SEROQUEL) 25 MG tablet Take 25 mg by mouth at bedtime.     Marland Kitchen Respiratory Therapy Supplies (FLUTTER) DEVI Use as directed 1 each 0  . sertraline (ZOLOFT) 25 MG tablet Take 25 mg by mouth daily.  99  . triamcinolone ointment (KENALOG) 0.1 % Apply 1 application topically 2 (two) times daily. 80 g 3   No facility-administered  medications prior to visit.     ROS Review of Systems  Constitutional: Positive for fatigue. Negative for activity change, appetite change, chills and unexpected weight change.  HENT: Negative for congestion, mouth sores and sinus pressure.   Eyes: Negative for visual disturbance.  Respiratory: Positive for cough. Negative for chest tightness.   Gastrointestinal: Negative for abdominal pain and nausea.  Genitourinary: Negative for difficulty urinating, frequency and vaginal pain.  Musculoskeletal: Positive for arthralgias and back pain. Negative for gait problem.  Skin: Negative for pallor and rash.  Neurological: Negative for dizziness, tremors, weakness, numbness and headaches.  Psychiatric/Behavioral: Negative for confusion and sleep disturbance.    Objective:  BP 112/70 (BP Location: Left Arm, Patient Position: Sitting, Cuff Size: Normal)   Pulse 69   Temp 98.3 F (36.8 C) (Oral)   Ht 5\' 6"  (1.676 m)   Wt 142 lb (64.4 kg)   LMP 10/29/2006 (Approximate)   SpO2 98%   BMI 22.92 kg/m   BP Readings from Last 3 Encounters:  05/21/17 112/70  05/21/17 106/70  04/04/17 130/80    Wt Readings from Last 3 Encounters:  05/21/17 142 lb (64.4 kg)  05/21/17 142 lb (64.4 kg)  04/04/17 141 lb 12.8 oz (64.3 kg)    Physical Exam  Constitutional: She appears well-developed. No distress.  HENT:  Head: Normocephalic.  Right Ear: External ear normal.  Left Ear:  External ear normal.  Nose: Nose normal.  Mouth/Throat: Oropharynx is clear and moist.  Eyes: Pupils are equal, round, and reactive to Velardi. Conjunctivae are normal. Right eye exhibits no discharge. Left eye exhibits no discharge.  Neck: Normal range of motion. Neck supple. No JVD present. No tracheal deviation present. No thyromegaly present.  Cardiovascular: Normal rate, regular rhythm and normal heart sounds.   Pulmonary/Chest: No stridor. No respiratory distress. She has no wheezes.  Abdominal: Soft. Bowel sounds are  normal. She exhibits no distension and no mass. There is no tenderness. There is no rebound and no guarding.  Musculoskeletal: She exhibits tenderness. She exhibits no edema.  Lymphadenopathy:    She has no cervical adenopathy.  Neurological: She displays normal reflexes. No cranial nerve deficit. She exhibits normal muscle tone. Coordination normal.  Skin: No rash noted. No erythema.  Psychiatric: She has a normal mood and affect. Her behavior is normal. Judgment and thought content normal.  R knee tender R knee bursa anserina - tender   Procedure Note :     Procedure : Joint Injection, R  knee   Indication:  Joint osteoarthritis with refractory  chronic pain.   Risks including unsuccessful procedure , bleeding, infection, bruising, skin atrophy, "steroid flare-up" and others were explained to the patient in detail as well as the benefits. Informed consent was obtained and signed.   Tthe patient was placed in a comfortable position. Lateral approach was used. Skin was prepped with Betadine and alcohol  and anesthetized a cooling spray. Then, a 5 cc syringe with a 1.5 inch long 25-gauge needle was used for a joint injection.. The needle was advanced  Into the knee joint cavity. I aspirated a small amount of intra-articular fluid to confirm correct placement of the needle and injected the joint with 5 mL of 2% lidocaine and 40 mg of Depo-Medrol .  Band-Aid was applied.   Tolerated well. Complications: None. Good pain relief following the procedure.    Procedure Note :    Procedure :Joint Injection,  R knee bursa anserina   Indication: Bursitis with refractory  chronic pain.   Risks including unsuccessful procedure , bleeding, infection, bruising, skin atrophy and others were explained to the patient in detail as well as the benefits. Informed consent was obtained and signed.   Tthe patient was placed in a comfortable position. Skin was prepped with Betadine and alcohol  and anesthetized  with a cooling spray. Then, a 3 cc syringe with a 1.5 inch long 25-gauge needle was used for a bursa injection in a fan-like fasion with 3 mL of 2% lidocaine and 40 mg of Depo-Medrol .  Band-Aid was applied.   Tolerated well. Complications: None. Good pain relief following the procedure.      Lab Results  Component Value Date   WBC 11.0 (H) 11/30/2016   HGB 12.4 11/30/2016   HCT 36.0 11/30/2016   PLT 160 11/30/2016   GLUCOSE 82 02/21/2017   CHOL 186 09/03/2014   TRIG 64.0 09/03/2014   HDL 55.00 09/03/2014   LDLCALC 118 (H) 09/03/2014   ALT 15 11/30/2016   AST 18 11/30/2016   NA 142 02/21/2017   K 4.1 02/21/2017   CL 107 02/21/2017   CREATININE 0.83 02/21/2017   BUN 13 02/21/2017   CO2 30 02/21/2017   TSH 3.19 08/17/2016    Ct Chest High Resolution  Result Date: 04/19/2017 CLINICAL DATA:  Follow-up pulmonary nodularity. EXAM: CT CHEST WITHOUT CONTRAST TECHNIQUE: Multidetector CT imaging of  the chest was performed following the standard protocol without intravenous contrast. High resolution imaging of the lungs, as well as inspiratory and expiratory imaging, was performed. COMPARISON:  02/26/2017 chest CT. FINDINGS: Cardiovascular: Normal heart size. No significant pericardial fluid/thickening. Atherosclerotic nonaneurysmal thoracic aorta. Normal caliber pulmonary arteries. Mediastinum/Nodes: No discrete thyroid nodules. Unremarkable esophagus. No pathologically enlarged axillary, mediastinal or gross hilar lymph nodes, noting limited sensitivity for the detection of hilar adenopathy on this noncontrast study. Lungs/Pleura: No pneumothorax. No pleural effusion. No acute consolidative airspace disease or lung masses. There is patchy mild-to-moderate cylindrical and varicoid bronchiectasis in both lungs, asymmetrically involving the right lung, most prominent in the right middle lobe and anterior medial right lower lobe, unchanged since 02/26/2017. There are extensive patchy tree-in-bud  opacities and centrilobular nodules throughout both lungs, asymmetrically involving the right lung, most prominent in the basilar right upper lobe, right middle lobe and right lower lobe, mildly worsened since 02/26/2017, for example with new ground-glass tree-in-bud opacities in the posterior right upper lobe (series 5/ image 53), with increased tree-in-bud opacities and centrilobular nodularity in the medial right lower lobe (series 5/ image 73), and with new 8 mm centrilobular solid right lower lobe nodule (series 5/ image 77) and slight growth of adjacent 9 x 8 mm centrilobular nodule (series 5/image 78), increased from 8 x 5 mm. Stable bandlike opacities in the medial basilar right middle lobe and inferior segment lingula with associated volume loss and distortion, compatible with postinfectious scarring. No significant regions of subpleural reticulation or frank honeycombing. No significant air trapping on the expiration sequence. Upper abdomen: Unremarkable. Musculoskeletal: No aggressive appearing focal osseous lesions. Mild thoracic spondylosis. Partially visualized surgical hardware from ACDF in the lower cervical spine. IMPRESSION: Spectrum of findings compatible with chronic infectious bronchiolitis due to atypical mycobacterial infection (MAI) with mild-to-moderate cylindrical and varicoid bronchiectasis, extensive patchy tree-in-bud opacities and centrilobular nodules and areas of postinfectious scarring, asymmetrically involving the right lung as described. Findings have mildly worsened since 02/26/2017. Aortic Atherosclerosis (ICD10-I70.0). Electronically Signed   By: Ilona Sorrel M.D.   On: 04/19/2017 15:23    Assessment & Plan:   There are no diagnoses linked to this encounter. I am having Ms. Emmick maintain her QUEtiapine, ALPRAZolam, amphetamine-dextroamphetamine, cholecalciferol, triamcinolone ointment, pantoprazole, levocetirizine, oxyCODONE-acetaminophen, levothyroxine, fluticasone  furoate-vilanterol, fluticasone, cyanocobalamin, sertraline, albuterol, and FLUTTER.  No orders of the defined types were placed in this encounter.    Follow-up: No Follow-up on file.  Walker Kehr, MD

## 2017-05-21 NOTE — Progress Notes (Signed)
PFT completed today. 05/21/17

## 2017-05-21 NOTE — Progress Notes (Signed)
Patient seen in the office today and instructed on use of flutter device.  Patient expressed understanding and demonstrated technique. 

## 2017-05-21 NOTE — Patient Instructions (Addendum)
   Continue using your Breo inhaler daily.  I'm switching your Albuterol inhaler from ProAir to Ventolin, the inhaler that they gave you today during your test.  Use the Flutter Valve/Acapella by blowing into it hard twice daily. This should help to loosen mucus for you to cough up for your sputum culture test.  Your sputum culture should be dropped off to the lab at [facility] within 4 hours and should not be refrigerated.  Call back next week and let me know if you haven't been able to produce a sputum sample. The next step would be a bronchoscopy.  I will see you back in 3 months or sooner if needed.  TESTS ORDERED: 1. CT CHEST W/O 2 MONTHS

## 2017-06-05 ENCOUNTER — Telehealth: Payer: Self-pay | Admitting: Pulmonary Disease

## 2017-06-05 NOTE — Telephone Encounter (Signed)
Spoke with Cheri Rous at [facility]. He stated that he had received sputum samples from the patient but the actual containers did not have any labels on them. He stated that the bag that the samples were in had the patient's demographics and lab reqs, but he said he still could not process the samples.   Will route to JN so he is aware.

## 2017-06-05 NOTE — Telephone Encounter (Signed)
That is entirely ridiculous and unfortunate. She [place] bit away. Can we have them supply her with specimen cups that are labeled so as to allow for processing please. Thank you.

## 2017-06-06 NOTE — Telephone Encounter (Signed)
I have spoken with Deidre Ala with [facility]. Deidre Ala states they will be able to provide pt with labeled specimen cups. Per [facility], labs will not have to be reordered. I have spoken with pt, and made her aware to go to [facility] lab to pick up labeled specimen cups. I apologized to pt for the inconvenience. Nothing further needed.  Will route to [place] as a fyi.

## 2017-06-10 ENCOUNTER — Other Ambulatory Visit: Payer: Self-pay | Admitting: Internal Medicine

## 2017-06-11 ENCOUNTER — Other Ambulatory Visit: Payer: Self-pay | Admitting: General Practice

## 2017-06-11 MED ORDER — PANTOPRAZOLE SODIUM 40 MG PO TBEC: 40.0000 mg | DELAYED_RELEASE_TABLET | Freq: Every day | ORAL | 5 refills | Status: DC

## 2017-07-03 ENCOUNTER — Emergency Department (HOSPITAL_COMMUNITY)
Admission: EM | Admit: 2017-07-03 | Discharge: 2017-07-04 | Disposition: A | Discharge status: Home / Self Care | Payer: Medicare Other | Attending: Emergency Medicine | Admitting: Emergency Medicine

## 2017-07-03 ENCOUNTER — Encounter (HOSPITAL_COMMUNITY): Payer: Self-pay

## 2017-07-03 ENCOUNTER — Telehealth: Payer: Self-pay | Admitting: Pulmonary Disease

## 2017-07-03 ENCOUNTER — Emergency Department (HOSPITAL_COMMUNITY): Payer: Medicare Other

## 2017-07-03 ENCOUNTER — Telehealth: Payer: Self-pay | Admitting: Internal Medicine

## 2017-07-03 DIAGNOSIS — R0602 Shortness of breath: Secondary | ICD-10-CM | POA: Insufficient documentation

## 2017-07-03 DIAGNOSIS — E039 Hypothyroidism, unspecified: Secondary | ICD-10-CM | POA: Diagnosis not present

## 2017-07-03 DIAGNOSIS — R079 Chest pain, unspecified: Secondary | ICD-10-CM

## 2017-07-03 DIAGNOSIS — Z87891 Personal history of nicotine dependence: Secondary | ICD-10-CM | POA: Insufficient documentation

## 2017-07-03 DIAGNOSIS — Z79899 Other long term (current) drug therapy: Secondary | ICD-10-CM | POA: Insufficient documentation

## 2017-07-03 DIAGNOSIS — R05 Cough: Secondary | ICD-10-CM | POA: Diagnosis not present

## 2017-07-03 DIAGNOSIS — J45909 Unspecified asthma, uncomplicated: Secondary | ICD-10-CM | POA: Diagnosis not present

## 2017-07-03 DIAGNOSIS — R0789 Other chest pain: Secondary | ICD-10-CM | POA: Diagnosis not present

## 2017-07-03 HISTORY — DX: Bronchiectasis, uncomplicated: J47.9

## 2017-07-03 HISTORY — DX: Solitary pulmonary nodule: R91.1

## 2017-07-03 LAB — TROPONIN I: Troponin I: <0.03 ng/mL (ref <0.03)

## 2017-07-03 LAB — COMPREHENSIVE METABOLIC PANEL
ALBUMIN: 4 g/dL (ref 3.5–5.0)
ALT: 11 U/L — AB (ref 14–54)
AST: 14 U/L — AB (ref 15–41)
Alkaline Phosphatase: 63 U/L (ref 38–126)
Anion gap: 7 (ref 5–15)
BUN: 14 mg/dL (ref 6–20)
CHLORIDE: 103 mmol/L (ref 101–111)
CO2: 28 mmol/L (ref 22–32)
CREATININE: 0.87 mg/dL (ref 0.44–1.00)
Calcium: 8.9 mg/dL (ref 8.9–10.3)
GFR calc Af Amer: >60 mL/min (ref >60)
GLUCOSE: 91 mg/dL (ref 65–99)
POTASSIUM: 3.2 mmol/L — AB (ref 3.5–5.1)
Sodium: 138 mmol/L (ref 135–145)
Total Bilirubin: 0.9 mg/dL (ref 0.3–1.2)
Total Protein: 6.9 g/dL (ref 6.5–8.1)

## 2017-07-03 LAB — CBC WITH DIFFERENTIAL/PLATELET
BASOS ABS: 0 10*3/uL (ref 0.0–0.1)
BASOS PCT: 0 %
EOS PCT: 1 %
Eosinophils Absolute: 0 10*3/uL (ref 0.0–0.7)
HCT: 42.7 % (ref 36.0–46.0)
Hemoglobin: 14.1 g/dL (ref 12.0–15.0)
LYMPHS PCT: 24 %
Lymphs Abs: 1.5 10*3/uL (ref 0.7–4.0)
MCH: 28.7 pg (ref 26.0–34.0)
MCHC: 33 g/dL (ref 30.0–36.0)
MCV: 87 fL (ref 78.0–100.0)
Monocytes Absolute: 0.3 10*3/uL (ref 0.1–1.0)
Monocytes Relative: 5 %
Neutro Abs: 4.5 10*3/uL (ref 1.7–7.7)
Neutrophils Relative %: 70 %
PLATELETS: 154 10*3/uL (ref 150–400)
RBC: 4.91 MIL/uL (ref 3.87–5.11)
RDW: 13.4 % (ref 11.5–15.5)
WBC: 6.3 10*3/uL (ref 4.0–10.5)

## 2017-07-03 MED ORDER — IPRATROPIUM-ALBUTEROL 0.5-2.5 (3) MG/3ML IN SOLN: 3.0000 mL | RESPIRATORY_TRACT | Status: DC

## 2017-07-03 MED ORDER — IPRATROPIUM-ALBUTEROL 0.5-2.5 (3) MG/3ML IN SOLN
3.0000 mL | RESPIRATORY_TRACT | Status: AC
  Administered 2017-07-03 (×2): 3 mL via RESPIRATORY_TRACT
  Filled 2017-07-03: qty 6

## 2017-07-03 NOTE — Telephone Encounter (Signed)
Patient Name: Melody [name]  DOB: 10/15/1956    Initial Comment caller states she is having chest pains and she is congestion.    Nurse Assessment  Nurse: Thad Ranger RN, Denise Date/Time (Eastern Time): 07/03/2017 4:58:49 PM  Confirm and document reason for call. If symptomatic, describe symptoms. ---Caller states she is having chest pains and chest congestion. She is seeing a Pulmonologist for her chronic chest congestion and the MD wanted her to cough the congestion up. States she can not cough the congestion up. Has not checked temp today, but she states she has been running fevers intermittently.  Does the patient have any new or worsening symptoms? ---Yes  Will a triage be completed? ---Yes  Related visit to physician within the last 2 weeks? ---No  Does the PT have any chronic conditions? (i.e. diabetes, asthma, etc.) ---No  Is this a behavioral health or substance abuse call? ---No     Guidelines    Guideline Title Affirmed Question Affirmed Notes  Chest Pain [1] Intermittent chest pain or "angina" AND [2] increasing in severity or frequency (Exception: pains lasting a few seconds)    Final Disposition User   Go to ED Now [place], RN, [employer]    Referrals  GO TO FACILITY OTHER - SPECIFY  GO TO FACILITY OTHER - SPECIFY   Disagree/Comply: Comply

## 2017-07-03 NOTE — ED Triage Notes (Signed)
Pt reports chest congestion since Jan.  Reports has had 2 ct scans and has followed up with a pulmonary care doctor.  Reports pulmonologist is trying to get a sputum sample so they will know what antibiotics to treat with.  Pt says has been unable to get anything up for the sputum sample.    Pt says has not been on any antibiotics since the symptoms started.  Pt c/o pain in chest between breasts.  Reports intermittent fevers, diarrhea, and generalized weakness.

## 2017-07-03 NOTE — Telephone Encounter (Signed)
Spoke with pt. States that she is not feeling well. Reports chest discomfort and weakness. Stated, "I just need to have something that will bring this mess out of my chest." Advised her that with it being as late in the day, she should go to the ED or an urgent care since she is having issues with chest discomfort. Pt verbalized understanding. Nothing further was needed.

## 2017-07-05 NOTE — ED Provider Notes (Signed)
[facility] DEPT Provider Note   CSN: [id number] Arrival date & time: 07/03/17  1844     History   Chief Complaint Chief Complaint  Patient presents with  . Chest Pain    HPI Melody [name] is a 61 y.o. female.   Chest Pain   This is a chronic problem. The current episode started more than 1 week ago. The problem occurs daily. The pain is at a severity of 5/10. The pain is mild. The quality of the pain is described as brief. The pain does not radiate. The symptoms are aggravated by deep breathing. Associated symptoms include cough and shortness of breath. She has tried nothing for the symptoms.    Past Medical History:  Diagnosis Date  . Anxiety   . Bronchiectasis ([place])   . Chronic back pain   . Chronic neck pain   . Depression    bipolar- Dr [name]  . Fatigue   . Fibromyalgia   . GERD (gastroesophageal reflux disease)   . Headache   . Hypothyroidism   . Low back pain    Dr Arnoldo Morale  . Osteoarthritis   . Pneumonia   . Pulmonary nodule   . Sinus congestion   . Thyroid disease    hypothyroidsm  . Vertigo   . Vitamin B12 deficiency     Patient Active Problem List   Diagnosis Date Noted  . Bursitis 05/21/2017  . Knee pain, chronic 05/21/2017  . Cough 04/04/2017  . Multiple lung nodules on CT 04/04/2017  . Change in voice 04/04/2017  . Fatigue 02/08/2016  . Complete uterovaginal prolapse 01/10/2016  . Status post total abdominal hysterectomy and bilateral salpingo-oophorectomy 01/10/2016  . Urinary incontinence 11/09/2015  . Vaginal discomfort 08/10/2015  . Rectocele 08/10/2015  . Well adult exam 09/03/2014  . Chronic obstructive airway disease with asthma ([place]) 09/03/2014  . UTI (urinary tract infection) 06/09/2014  . Dysplastic toenail 09/02/2013  . Aphthous ulcer 06/11/2013  . Dark urine 03/18/2013  . Rash 06/08/2012  . PRURITUS 12/01/2010  . EXPOSURE TO HAZARDOUS BODY FLUID, HX OF 05/29/2010  . HIP PAIN 12/05/2009  . RUQ PAIN 02/04/2009  . WRIST  PAIN 05/24/2008  . CERVICAL STRAIN 05/24/2008  . WARTS, VIRAL, UNSPECIFIED 04/06/2008  . NEOPLASM, SKIN, UNCERTAIN BEHAVIOR 09/62/8366  . Vitamin D deficiency 11/13/2007  . Hypothyroidism 08/05/2007  . Bipolar I disorder ([place]) 08/05/2007  . GERD 08/05/2007  . Anxiety state 07/23/2007  . LOW BACK PAIN 07/23/2007  . Myalgia and myositis 07/23/2007  . B12 deficiency 05/22/2007  . IRON DEFICIENCY 05/22/2007    Past Surgical History:  Procedure Laterality Date  . ABDOMINAL HYSTERECTOMY N/A 01/10/2016   Procedure: HYSTERECTOMY ABDOMINAL;  Surgeon: Nunzio Cobbs, MD;  Location: [facility];  Service: Gynecology;  Laterality: N/A;  . ABDOMINAL SACROCOLPOPEXY N/A 01/10/2016   Procedure: ABDOMINO SACROCOLPOPEXY ;  Surgeon: Nunzio Cobbs, MD;  Location: [facility];  Service: Gynecology;  Laterality: N/A;  . ANTERIOR AND POSTERIOR REPAIR N/A 01/10/2016   Procedure:  POSTERIOR REPAIR (RECTOCELE);  Surgeon: Nunzio Cobbs, MD;  Location: [facility];  Service: Gynecology;  Laterality: N/A;  . BLADDER SUSPENSION N/A 01/10/2016   Procedure: TRANSVAGINAL TAPE (TVT) PROCEDURE exact midurethral sling;  Surgeon: Nunzio Cobbs, MD;  Location: [facility];  Service: Gynecology;  Laterality: N/A;  . CERVICAL LAMINECTOMY  2001 and 2995   Dr Arnoldo Morale  . CYSTO N/A 01/10/2016   Procedure: CYSTOSCOPY;  Surgeon: Everardo All  Yisroel Ramming, MD;  Location: [facility];  Service: Gynecology;  Laterality: N/A;  . SALPINGOOPHORECTOMY Bilateral 01/10/2016   Procedure: BILATERAL SALPINGO OOPHORECTOMY;  Surgeon: Nunzio Cobbs, MD;  Location: [facility];  Service: Gynecology;  Laterality: Bilateral;  . TUBAL LIGATION  1980    OB History    Gravida Para Term Preterm AB Living   2 2 2     2    SAB TAB Ectopic Multiple Live Births                   Home Medications    Prior to Admission medications   Medication Sig Start Date End Date Taking? Authorizing Provider  albuterol (PROVENTIL  HFA;VENTOLIN HFA) 108 (90 Base) MCG/ACT inhaler Inhale 2 puffs into the lungs every 6 (six) hours as needed for wheezing or shortness of breath. 05/21/17  Yes Javier Glazier, MD  ALPRAZolam Duanne Moron) 1 MG tablet Take 1 mg by mouth 4 (four) times daily.    Yes [provider]  amphetamine-dextroamphetamine (ADDERALL) 30 MG tablet Take 15-30 mg by mouth 2 (two) times daily as needed (For focus.). Takes one tablet in the morning, may take one-half tablet in the afternoon if needed   Yes [provider]  cholecalciferol (VITAMIN D) 1000 units tablet Take 1,000 Units by mouth once a week.   Yes [provider]  cyanocobalamin (,VITAMIN B-12,) 1000 MCG/ML injection INJECT 1 ML INTO THE MUSCLE EVERY 14 DAYS. 02/21/17  Yes Plotnikov, Evie Lacks, MD  fluticasone (FLONASE) 50 MCG/ACT nasal spray USE 2 SPRAYS IN BOTH NOSTRILS DAILY. 02/21/17  Yes Plotnikov, Evie Lacks, MD  fluticasone furoate-vilanterol (BREO ELLIPTA) 100-25 MCG/INH AEPB Inhale 1 puff into the lungs daily. 02/18/17  Yes Plotnikov, Evie Lacks, MD  levocetirizine (XYZAL) 5 MG tablet TAKE 1 TABLET BY MOUTH EVERY EVENING. 01/18/17  Yes Plotnikov, Evie Lacks, MD  levothyroxine (SYNTHROID, LEVOTHROID) 50 MCG tablet Take 1 tablet (50 mcg total) by mouth daily before breakfast. 02/18/17  Yes Plotnikov, Evie Lacks, MD  oxyCODONE-acetaminophen (PERCOCET/ROXICET) 5-325 MG tablet Take 1 tablet by mouth 2 (two) times daily as needed for severe pain. 05/21/17  Yes Plotnikov, Evie Lacks, MD  pantoprazole (PROTONIX) 40 MG tablet Take 1 tablet (40 mg total) by mouth daily. 06/11/17  Yes Plotnikov, Evie Lacks, MD  QUEtiapine (SEROQUEL) 25 MG tablet Take 25 mg by mouth at bedtime.    Yes [provider]  Respiratory Therapy Supplies (FLUTTER) DEVI Use as directed 05/21/17  Yes Javier Glazier, MD  sertraline (ZOLOFT) 25 MG tablet Take 25 mg by mouth daily. 05/08/17  Yes [provider]  triamcinolone ointment (KENALOG) 0.1 % Apply 1  application topically 2 (two) times daily. 11/20/16  Yes Plotnikov, Evie Lacks, MD    Family History Family History  Problem Relation Age of Onset  . Heart attack Father        dec heart disease age 42  . Hypertension Father   . Lung disease Father   . Cancer Mother        dec stomach ca--age 52  . Breast cancer Cousin   . Arthritis Other   . Breast cancer Paternal Aunt     Social History Social History  Substance Use Topics  . Smoking status: Former Smoker    Packs/day: 1.00    Years: 35.00    Types: Cigarettes    Start date: 11/21/1962    Quit date: 05/06/2011  . Smokeless tobacco: Never Used  Comment: stopped periodically - peak rate of 1 ppd  . Alcohol use No     Allergies   Doxycycline hyclate; Nabumetone; Sulfa antibiotics; Tetracycline hcl; and Tramadol hcl   Review of Systems Review of Systems  Respiratory: Positive for cough and shortness of breath.   Cardiovascular: Positive for chest pain.  All other systems reviewed and are negative.    Physical Exam Updated Vital Signs BP 115/72   Pulse 75   Temp 98.2 F (36.8 C) (Oral)   Resp 19   Ht 5\' 6"  (1.676 m)   Wt 63.5 kg (140 lb)   LMP 10/29/2006 (Approximate)   SpO2 97%   BMI 22.60 kg/m   Physical Exam  Constitutional: She appears well-developed and well-nourished.  HENT:  Head: Normocephalic and atraumatic.  Eyes: Conjunctivae and EOM are normal.  Neck: Normal range of motion.  Cardiovascular: Normal rate and regular rhythm.   Pulmonary/Chest: No stridor. No respiratory distress. She has wheezes (on right). She has rales.  Abdominal: Soft. She exhibits no distension.  Neurological: She is alert.  Skin: Skin is warm and dry. No erythema.  Nursing note and vitals reviewed.    ED Treatments / Results  Labs (all labs ordered are listed, but only abnormal results are displayed) Labs Reviewed  COMPREHENSIVE METABOLIC PANEL - Abnormal; Notable for the following:       Result Value    Potassium 3.2 (*)    AST 14 (*)    ALT 11 (*)    All other components within normal limits  CBC WITH DIFFERENTIAL/PLATELET  TROPONIN I    EKG  EKG Interpretation  Date/Time:  Wednesday July 03 2017 18:57:17 EDT Ventricular Rate:  81 PR Interval:  154 QRS Duration: 72 QT Interval:  376 QTC Calculation: 436 R Axis:   10 Text Interpretation:  Normal sinus rhythm Nonspecific T wave abnormality Abnormal ECG No significant change since last tracing Confirmed by [name] [phone]) on 07/03/2017 9:00:34 PM       Radiology Dg Chest 2 View  Result Date: 07/03/2017 CLINICAL DATA:  Chest pain and shortness of breath. Chest congestion and cough. EXAM: CHEST  2 VIEW COMPARISON:  High-resolution chest CT 04/19/2017 FINDINGS: Normal heart size and mediastinal contours. No pulmonary edema. Reticulonodular opacities in the right mid lower lung zone corresponding to nodule is a bronchiectasis on high-resolution chest CT, similar allowing for differences in modality. No confluent airspace disease. No pleural fluid or pneumothorax. No acute osseous abnormalities. IMPRESSION: Reticulonodular right lung opacities similar to prior chest CT, consistent with atypical mycobacterial infection (MAI). No superimposed acute abnormality. Electronically Signed   By: Jeb Levering M.D.   On: 07/03/2017 21:43    Procedures Procedures (including critical care time)  Medications Ordered in ED Medications  ipratropium-albuterol (DUONEB) 0.5-2.5 (3) MG/3ML nebulizer solution 3 mL (3 mLs Nebulization Given 07/03/17 2108)     Initial Impression / Assessment and Plan / ED Course  I have reviewed the triage vital signs and the nursing notes.  Pertinent labs & imaging results that were available during my care of the patient were reviewed by me and considered in my medical decision making (see chart for details).     acyte exaxcefbation of her chronic right sided chest pain likely from some type of atypical  pneumonia or inflammatory condition. Her pulmonologist doesn't want abx at this time, and patient not obvious infected so wont start them. She also has a CT scan coming up and she thinks possible biopsy so  will on steroids as well. Will dc to use inhaler and  Have close pulm follow up.   Final Clinical Impressions(s) / ED Diagnoses   Final diagnoses:  Chest pain, unspecified type    New Prescriptions Discharge Medication List as of 07/03/2017 11:51 PM       Kathlynn Swofford, [name], MD 07/05/17 1002

## 2017-07-12 ENCOUNTER — Other Ambulatory Visit: Payer: Self-pay | Admitting: Internal Medicine

## 2017-07-22 ENCOUNTER — Ambulatory Visit (HOSPITAL_COMMUNITY)
Admission: RE | Admit: 2017-07-22 | Discharge: 2017-07-22 | Disposition: A | Discharge status: Home / Self Care | Payer: Medicare Other | Source: Ambulatory Visit | Attending: Pulmonary Disease | Admitting: Pulmonary Disease

## 2017-07-22 DIAGNOSIS — R918 Other nonspecific abnormal finding of lung field: Secondary | ICD-10-CM | POA: Diagnosis not present

## 2017-07-22 DIAGNOSIS — I700 Atherosclerosis of aorta: Secondary | ICD-10-CM | POA: Insufficient documentation

## 2017-07-30 ENCOUNTER — Telehealth: Payer: Self-pay | Admitting: Pulmonary Disease

## 2017-07-30 NOTE — Telephone Encounter (Signed)
Spoke with pt, re-reviewed ct results with pt as she had additional questions.  Questions were answered to best of my ability and to pt's satisfaction.  Nothing further needed.

## 2017-08-01 ENCOUNTER — Other Ambulatory Visit: Payer: Self-pay | Admitting: Internal Medicine

## 2017-08-18 ENCOUNTER — Other Ambulatory Visit (HOSPITAL_COMMUNITY)
Admission: RE | Admit: 2017-08-18 | Discharge: 2017-08-18 | Disposition: A | Discharge status: Home / Self Care | Payer: Medicare Other | Source: Other Acute Inpatient Hospital | Attending: Pulmonary Disease | Admitting: Pulmonary Disease

## 2017-08-18 DIAGNOSIS — R05 Cough: Secondary | ICD-10-CM | POA: Diagnosis not present

## 2017-08-19 ENCOUNTER — Ambulatory Visit: Payer: Medicare Other | Admitting: Internal Medicine

## 2017-08-20 ENCOUNTER — Telehealth: Payer: Self-pay

## 2017-08-20 DIAGNOSIS — R05 Cough: Secondary | ICD-10-CM

## 2017-08-20 DIAGNOSIS — R059 Cough, unspecified: Secondary | ICD-10-CM

## 2017-08-20 NOTE — Telephone Encounter (Signed)
Noted. We need to try and get a second sputum culture for AFB, Fungus & routine bacteria to confirm this. See if they can supply her with a specimen cup at [facility]. Thanks.

## 2017-08-20 NOTE — Telephone Encounter (Signed)
Spoke to [place] with [facility], who states pt has positive AFB. Results are within epic.   JN please advise. Thanks.

## 2017-08-20 NOTE — Telephone Encounter (Signed)
I called patient to r/s  appt for 10/26 - pt asking about sputum culture - she is requesting a call back - she can be reached at [phone] -pr

## 2017-08-20 NOTE — Telephone Encounter (Signed)
Culture is back, can we review?

## 2017-08-20 NOTE — Telephone Encounter (Signed)
I have spoken with Judeen Hammans with [facility], who confirmed that they can supply pt with specimen cups.  Pt has been made aware that Dr. Ammie Dalton recommendations. Pt states she is unable to produce sputum. During our convocation call was disconnected. I then attempted to call pt back and received pt's voicemail, unable to leave voicemail due to mailbox not being setup.  Labs have been ordered.

## 2017-08-20 NOTE — Telephone Encounter (Signed)
Yes. 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

## 2017-08-20 NOTE — Telephone Encounter (Signed)
ATC patient. Left message for patient to call back.

## 2017-08-21 ENCOUNTER — Ambulatory Visit: Payer: Medicare Other | Admitting: Pulmonary Disease

## 2017-08-21 LAB — CULTURE, RESPIRATORY W GRAM STAIN: Culture: NORMAL

## 2017-08-21 LAB — CULTURE, RESPIRATORY

## 2017-08-23 ENCOUNTER — Ambulatory Visit: Payer: Medicare Other | Admitting: Pulmonary Disease

## 2017-08-23 ENCOUNTER — Ambulatory Visit (INDEPENDENT_AMBULATORY_CARE_PROVIDER_SITE_OTHER): Payer: Medicare Other | Admitting: Internal Medicine

## 2017-08-23 ENCOUNTER — Encounter: Payer: Self-pay | Admitting: Internal Medicine

## 2017-08-23 VITALS — BP 130/72 | HR 66 | Temp 98.5°F | Ht 66.0 in | Wt 148.0 lb

## 2017-08-23 DIAGNOSIS — M25561 Pain in right knee: Secondary | ICD-10-CM | POA: Diagnosis not present

## 2017-08-23 DIAGNOSIS — Z23 Encounter for immunization: Secondary | ICD-10-CM | POA: Diagnosis not present

## 2017-08-23 DIAGNOSIS — M5440 Lumbago with sciatica, unspecified side: Secondary | ICD-10-CM

## 2017-08-23 DIAGNOSIS — F411 Generalized anxiety disorder: Secondary | ICD-10-CM

## 2017-08-23 DIAGNOSIS — J449 Chronic obstructive pulmonary disease, unspecified: Secondary | ICD-10-CM | POA: Diagnosis not present

## 2017-08-23 DIAGNOSIS — G8929 Other chronic pain: Secondary | ICD-10-CM | POA: Diagnosis not present

## 2017-08-23 DIAGNOSIS — E538 Deficiency of other specified B group vitamins: Secondary | ICD-10-CM

## 2017-08-23 DIAGNOSIS — E034 Atrophy of thyroid (acquired): Secondary | ICD-10-CM

## 2017-08-23 DIAGNOSIS — R918 Other nonspecific abnormal finding of lung field: Secondary | ICD-10-CM

## 2017-08-23 DIAGNOSIS — E559 Vitamin D deficiency, unspecified: Secondary | ICD-10-CM

## 2017-08-23 MED ORDER — OXYCODONE-ACETAMINOPHEN 5-325 MG PO TABS: 1.0000 | ORAL_TABLET | Freq: Two times a day (BID) | ORAL | 0 refills | Status: DC | PRN

## 2017-08-23 MED ORDER — METHYLPREDNISOLONE ACETATE 40 MG/ML IJ SUSP
40.0000 mg | Freq: Once | INTRAMUSCULAR | Status: AC
  Administered 2017-08-23: 40 mg via INTRA_ARTICULAR

## 2017-08-23 NOTE — Assessment & Plan Note (Signed)
Dr [name] Seroquel, Xanax prn

## 2017-08-23 NOTE — Assessment & Plan Note (Signed)
On B12 

## 2017-08-23 NOTE — Assessment & Plan Note (Signed)
F/u w/Dr Ashok Cordia

## 2017-08-23 NOTE — Assessment & Plan Note (Signed)
Percocet   Potential benefits of a long term opioids use as well as potential risks (i.e. addiction risk, apnea etc) and complications (i.e. Somnolence, constipation and others) were explained to the patient and were aknowledged.  Potential benefits of a long term NSAID  use as well as potential risks  and complications were explained to the patient and were aknowledged.

## 2017-08-23 NOTE — Progress Notes (Signed)
Subjective:  Patient ID: Melody [name], female    DOB: Jan 04, 1956  Age: 61 y.o. MRN: [id number]  CC: No chief complaint on file.   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 albuterol (PROVENTIL HFA;VENTOLIN HFA) 108 (90 Base) MCG/ACT inhaler Inhale 2 puffs into the lungs every 6 (six) hours as needed for wheezing or shortness of breath. 1 Inhaler 6  . ALPRAZolam (XANAX) 1 MG tablet Take 1 mg by mouth 4 (four) times daily.     Marland Kitchen amphetamine-dextroamphetamine (ADDERALL) 30 MG tablet Take 15-30 mg by mouth 2 (two) times daily as needed (For focus.). Takes one tablet in the morning, may take one-half tablet in the afternoon if needed    . cholecalciferol (VITAMIN D) 1000 units tablet Take 1,000 Units by mouth once a week.    . cyanocobalamin (,VITAMIN B-12,) 1000 MCG/ML injection INJECT 1 ML INTO THE MUSCLE EVERY 14 DAYS. 2 mL 11  . fluticasone (FLONASE) 50 MCG/ACT nasal spray USE 2 SPRAYS IN BOTH NOSTRILS DAILY. 16 g 11  . fluticasone furoate-vilanterol (BREO ELLIPTA) 100-25 MCG/INH AEPB Inhale 1 puff into the lungs daily. 60 each 5  . levocetirizine (XYZAL) 5 MG tablet TAKE 1 TABLET BY MOUTH EVERY EVENING. 30 tablet 11  . levothyroxine (SYNTHROID, LEVOTHROID) 50 MCG tablet Take 1 tablet (50 mcg total) by mouth daily before breakfast. 30 tablet 11  . oxyCODONE-acetaminophen (PERCOCET/ROXICET) 5-325 MG tablet Take 1 tablet by mouth 2 (two) times daily as needed for severe pain. 60 tablet 0  . pantoprazole (PROTONIX) 40 MG tablet Take 1 tablet (40 mg total) by mouth daily. 30 tablet 5  . QUEtiapine (SEROQUEL) 25 MG tablet Take 25 mg by mouth at bedtime.     Marland Kitchen Respiratory Therapy Supplies (FLUTTER) DEVI Use as directed 1 each 0  . sertraline (ZOLOFT) 25 MG tablet Take 25 mg by mouth daily.  99  . triamcinolone ointment (KENALOG) 0.1 % Apply 1 application topically 2  (two) times daily. 80 g 3   No facility-administered medications prior to visit.     ROS Review of Systems  Constitutional: Positive for fatigue. Negative for activity change, appetite change, chills and unexpected weight change.  HENT: Negative for congestion, mouth sores and sinus pressure.   Eyes: Negative for visual disturbance.  Respiratory: Positive for cough. Negative for chest tightness.   Gastrointestinal: Negative for abdominal pain and nausea.  Genitourinary: Negative for difficulty urinating, frequency and vaginal pain.  Musculoskeletal: Positive for arthralgias and gait problem. Negative for back pain.  Skin: Negative for pallor and rash.  Neurological: Negative for dizziness, tremors, weakness, numbness and headaches.  Psychiatric/Behavioral: Negative for confusion and sleep disturbance. The patient is nervous/anxious.     Objective:  BP 130/72 (BP Location: Left Arm, Patient Position: Sitting, Cuff Size: Normal)   Pulse 66   Temp 98.5 F (36.9 C) (Oral)   Ht 5\' 6"  (1.676 m)   Wt 148 lb (67.1 kg)   LMP 10/29/2006 (Approximate)   SpO2 98%   BMI 23.89 kg/m   BP Readings from Last 3 Encounters:  08/23/17 130/72  07/03/17 115/72  05/21/17 112/70    Wt Readings from Last 3 Encounters:  08/23/17 148 lb (67.1 kg)  07/03/17 140 lb (63.5 kg)  05/21/17 142 lb (64.4 kg)    Physical Exam  Constitutional: She appears well-developed. No distress.  HENT:  Head: Normocephalic.  Right Ear: External ear normal.  Left Ear: External ear normal.  Nose: Nose normal.  Mouth/Throat: Oropharynx is clear and moist.  Eyes: Pupils are equal, round, and reactive to Babiarz. Conjunctivae are normal. Right eye exhibits no discharge. Left eye exhibits no discharge.  Neck: Normal range of motion. Neck supple. No JVD present. No tracheal deviation present. No thyromegaly present.  Cardiovascular: Normal rate, regular rhythm and normal heart sounds.   Pulmonary/Chest: No stridor. No  respiratory distress. She has no wheezes.  Abdominal: Soft. Bowel sounds are normal. She exhibits no distension and no mass. There is no tenderness. There is no rebound and no guarding.  Musculoskeletal: She exhibits tenderness. She exhibits no edema.  Lymphadenopathy:    She has no cervical adenopathy.  Neurological: She displays normal reflexes. No cranial nerve deficit. She exhibits normal muscle tone. Coordination normal.  Skin: No rash noted. No erythema.  Psychiatric: She has a normal mood and affect. Her behavior is normal. Judgment and thought content normal.  R knee - tender   Procedure Note :    Procedure : Injection of the  R Knee joint  bursa pes anserine injection   Indication: Knee bursitis with refractory  chronic pain.   Risks including unsuccessful procedure , bleeding, infection, bruising, skin atrophy and others were explained to the patient in detail as well as the benefits. Informed consent was obtained and signed.   Tthe patient was placed in a comfortable position. Skin was prepped with Betadine and alcohol  and anesthetized with a cooling spray. Then, a 3 cc syringe with a 1.5 inch long 25-gauge needle was used for a bursa injection in a fan-like fasion with 3 mL of 2% lidocaine and 40 mg of Depo-Medrol .  Band-Aid was applied.   Tolerated well. Complications: None. Good pain relief following the procedure.    Lab Results  Component Value Date   WBC 6.3 07/03/2017   HGB 14.1 07/03/2017   HCT 42.7 07/03/2017   PLT 154 07/03/2017   GLUCOSE 91 07/03/2017   CHOL 186 09/03/2014   TRIG 64.0 09/03/2014   HDL 55.00 09/03/2014   LDLCALC 118 (H) 09/03/2014   ALT 11 (L) 07/03/2017   AST 14 (L) 07/03/2017   NA 138 07/03/2017   K 3.2 (L) 07/03/2017   CL 103 07/03/2017   CREATININE 0.87 07/03/2017   BUN 14 07/03/2017   CO2 28 07/03/2017   TSH 3.19 08/17/2016    No results found.  Assessment & Plan:   There are no diagnoses linked to this encounter. I am  having Ms. Pine maintain her QUEtiapine, ALPRAZolam, amphetamine-dextroamphetamine, cholecalciferol, triamcinolone ointment, levothyroxine, fluticasone, cyanocobalamin, sertraline, albuterol, FLUTTER, oxyCODONE-acetaminophen, pantoprazole, levocetirizine, and fluticasone furoate-vilanterol.  No orders of the defined types were placed in this encounter.    Follow-up: No Follow-up on file.  Walker Kehr, MD

## 2017-08-23 NOTE — Assessment & Plan Note (Signed)
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

## 2017-08-23 NOTE — Patient Instructions (Signed)
Postprocedure instructions :    A Band-Aid should be left on for 12 hours. Injection therapy is not a cure itself. It is used in conjunction with other modalities. You can use nonsteroidal anti-inflammatories like ibuprofen , hot and cold compresses. Rest is recommended in the next 24 hours. You need to report immediately  if fever, chills or any signs of infection develop. 

## 2017-08-23 NOTE — Assessment & Plan Note (Signed)
Levothroid 

## 2017-08-23 NOTE — Assessment & Plan Note (Signed)
On vit D 

## 2017-09-09 ENCOUNTER — Ambulatory Visit (INDEPENDENT_AMBULATORY_CARE_PROVIDER_SITE_OTHER): Payer: Medicare Other | Admitting: Pulmonary Disease

## 2017-09-09 ENCOUNTER — Encounter: Payer: Self-pay | Admitting: Pulmonary Disease

## 2017-09-09 VITALS — BP 138/80 | HR 81 | Ht 66.0 in | Wt 146.0 lb

## 2017-09-09 DIAGNOSIS — J302 Other seasonal allergic rhinitis: Secondary | ICD-10-CM | POA: Diagnosis not present

## 2017-09-09 DIAGNOSIS — J479 Bronchiectasis, uncomplicated: Secondary | ICD-10-CM

## 2017-09-09 DIAGNOSIS — R918 Other nonspecific abnormal finding of lung field: Secondary | ICD-10-CM | POA: Diagnosis not present

## 2017-09-09 DIAGNOSIS — K219 Gastro-esophageal reflux disease without esophagitis: Secondary | ICD-10-CM | POA: Diagnosis not present

## 2017-09-09 MED ORDER — ALBUTEROL SULFATE HFA 108 (90 BASE) MCG/ACT IN AERS: 2.0000 | INHALATION_SPRAY | Freq: Four times a day (QID) | RESPIRATORY_TRACT | 6 refills | Status: DC | PRN

## 2017-09-09 NOTE — Patient Instructions (Addendum)
   Contact my office if you have any questions or concerns before your next appointment.  We will see you back in 3 months or sooner if needed.  I'm referring you to a local Infectious Diseases Specialist while we wait on your sputum culture to finalize.  You can take the active ingredient in Mucinex which is Guaifenesin twice daily to help loosen your mucus.  Use the Dymista sample in place of your Flonase nasal spray. Do 1 spray in each nostril twice daily. Remember to use the proper technique.  Call our office if you feel the Dymista works better than the Flonase and we will send in a prescription.

## 2017-09-09 NOTE — Progress Notes (Signed)
Subjective:    Patient ID: Melody [name], female    DOB: 07/16/1956, 61 y.o.   MRN: [id number]  C.C.:  Follow-up for Bronchiectasis, Multiple Lung Nodules, GERD w/ Dysphagia, & Chronic Seasonal Allergic Rhinitis.   HPI Bronchiectasis: No evidence of autoimmune disease or immunodeficiency. Only mild airway obstruction on spirometry. Previously prescribed Breo & intolerant of Pro-air. Switched over to Ventolin inhaler at last appointment. Also prescribed flutter valve for airway clearance. She reports her cough has improved significantly. She reports she never had the Ventolin prescribed. She feels the Memory Dance is helping with her dyspnea.  Multiple lung nodules: Likely secondary to atypical infection. Sputum culture from October yielded Aspergillus fumigatus but also atypical mycobacterial infection. Identification by sequencing remains pending. She reports her cough has significantly improved. She reports she hasn't been living at home for the last 2 weeks and she feels this is the main reason she has improved.   GERD with dysphagia: No evidence of reflux or abnormality on esophagram. Prescribed Protonix. No reflux or dyspepsia.  Chronic seasonal allergic rhinitis: Prescribed Xyzal and Flonase. She reports she is adherent to her regimen.   Review of Systems No fever or chills. No chest pain or pressure. She does have rare palpitations. No rashes or bruising.   Allergies  Allergen Reactions  . Doxycycline Hyclate Itching  . Nabumetone Other (See Comments)    Headache  . Sulfa Antibiotics Other (See Comments)    Reaction is unknown  . Tetracycline Hcl Itching  . Tramadol Hcl Other (See Comments)    Reaction is unknown    Current Outpatient Medications on File Prior to Visit  Medication Sig Dispense Refill  . albuterol (PROVENTIL HFA;VENTOLIN HFA) 108 (90 Base) MCG/ACT inhaler Inhale 2 puffs into the lungs every 6 (six) hours as needed for wheezing or shortness of breath. 1 Inhaler 6  .  ALPRAZolam (XANAX) 1 MG tablet Take 1 mg by mouth 4 (four) times daily.     Marland Kitchen amphetamine-dextroamphetamine (ADDERALL) 30 MG tablet Take 15-30 mg by mouth 2 (two) times daily as needed (For focus.). Takes one tablet in the morning, may take one-half tablet in the afternoon if needed    . cholecalciferol (VITAMIN D) 1000 units tablet Take 1,000 Units by mouth once a week.    . cyanocobalamin (,VITAMIN B-12,) 1000 MCG/ML injection INJECT 1 ML INTO THE MUSCLE EVERY 14 DAYS. 2 mL 11  . fluticasone (FLONASE) 50 MCG/ACT nasal spray USE 2 SPRAYS IN BOTH NOSTRILS DAILY. 16 g 11  . fluticasone furoate-vilanterol (BREO ELLIPTA) 100-25 MCG/INH AEPB Inhale 1 puff into the lungs daily. 60 each 5  . levocetirizine (XYZAL) 5 MG tablet TAKE 1 TABLET BY MOUTH EVERY EVENING. 30 tablet 11  . levothyroxine (SYNTHROID, LEVOTHROID) 50 MCG tablet Take 1 tablet (50 mcg total) by mouth daily before breakfast. 30 tablet 11  . oxyCODONE-acetaminophen (PERCOCET/ROXICET) 5-325 MG tablet Take 1 tablet by mouth 2 (two) times daily as needed for severe pain. 60 tablet 0  . pantoprazole (PROTONIX) 40 MG tablet Take 1 tablet (40 mg total) by mouth daily. 30 tablet 5  . QUEtiapine (SEROQUEL) 25 MG tablet Take 25 mg by mouth at bedtime.     Marland Kitchen Respiratory Therapy Supplies (FLUTTER) DEVI Use as directed 1 each 0  . sertraline (ZOLOFT) 25 MG tablet Take 25 mg by mouth daily.  99  . triamcinolone ointment (KENALOG) 0.1 % Apply 1 application topically 2 (two) times daily. 80 g 3   No current facility-administered  medications on file prior to visit.     Past Medical History:  Diagnosis Date  . Anxiety   . Bronchiectasis ([place])   . Chronic back pain   . Chronic neck pain   . Depression    bipolar- Dr [name]  . Fatigue   . Fibromyalgia   . GERD (gastroesophageal reflux disease)   . Headache   . Hypothyroidism   . Low back pain    Dr Arnoldo Morale  . Osteoarthritis   . Pneumonia   . Pulmonary nodule   . Sinus congestion   . Thyroid  disease    hypothyroidsm  . Vertigo   . Vitamin B12 deficiency     Past Surgical History:  Procedure Laterality Date  . CERVICAL LAMINECTOMY  2001 and 2995   Dr Arnoldo Morale  . TUBAL LIGATION  1980    Family History  Problem Relation Age of Onset  . Heart attack Father        dec heart disease age 77  . Hypertension Father   . Lung disease Father   . Cancer Mother        dec stomach ca--age 9  . Breast cancer Cousin   . Arthritis Other   . Breast cancer Paternal Aunt     Social History   Socioeconomic History  . Marital status: Legally Separated    Spouse name: None  . Number of children: None  . Years of education: None  . Highest education level: None  Social Needs  . Financial resource strain: None  . Food insecurity - worry: None  . Food insecurity - inability: None  . Transportation needs - medical: None  . Transportation needs - non-medical: None  Occupational History  . None  Tobacco Use  . Smoking status: Former Smoker    Packs/day: 1.00    Years: 35.00    Pack years: 35.00    Types: Cigarettes    Start date: 11/21/1962    Last attempt to quit: 05/06/2011    Years since quitting: 6.3  . Smokeless tobacco: Never Used  . Tobacco comment: stopped periodically - peak rate of 1 ppd  Substance and Sexual Activity  . Alcohol use: No    Alcohol/week: 0.0 oz  . Drug use: No  . Sexual activity: Yes    Birth control/protection: Post-menopausal, Surgical    Comment: Tubal/Hyst/BSO  Other Topics Concern  . None  Social History Narrative   [facility] (04/04/17):   Originally from [place], [place]. 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 Recently had a persian cat that passed. No bird exposure. She denies any indoor plants currently. She does use wood burning for heat. She reports there is water getting under her foundation. She has mold in her home that she reports is in the 2nd floor of her home. She has  recently started seeing mold under her bed. She reports her home smells musty & damp. She has always lived in [place].       Objective:   Physical Exam BP 138/80 (BP Location: Left Arm, Cuff Size: Normal)   Pulse 81   Ht 5\' 6"  (1.676 m)   Wt 146 lb (66.2 kg)   LMP 10/29/2006 (Approximate)   SpO2 98%   BMI 23.57 kg/m   General:  Caucasian female. No distress. Awake. Integument:  No rash. Warm. Dry. Extremities:  No cyanosis or clubbing.  HEENT:  Moderate bilateral nasal turbinate swelling  with pale mucosa. No oral ulcers. Moist mucous membranes. Cardiovascular:  Regular rate. No edema. Regular rhythm.  Pulmonary:  Minimal bilateral squeaks. Otherwise good aeration. Normal work of breathing. Abdomen: Soft. Normal bowel sounds. Nondistended.  Musculoskeletal:  Normal bulk and tone. No joint deformity or effusion appreciated. Neurological:  Cranial nerves 2-12 grossly in tact. No meningismus.   PFT 05/21/17: FVC 3.10 L (89%) FEV1 2.14 L (79%) FEV1/FVC 0.69 FEF 25-75 1.36 L (57%) negative bronchodilator response TLC 5.34 L (99%) RV 108% ERV 72% DLCO corrected 98%  IMAGING CT CHEST W/O 07/22/17 (personally reviewed by me):  There is no pleural effusion or thickening. No pericardial effusion. No pathologic mediastinal adenopathy. Slight increase in nodularity particularly in the right middle lobe and a bronchovascular distribution. Bronchiectasis suggested. No consolidation.  HRCT CHEST W/O 04/19/17 (previously reviewed by me):  No intralobular septal thickening, groundglass opacities, or clinical changes to suggest interstitial lung disease. Mild to moderate degree of bronchiectasis worse on the right compared to the left and worse also in the lower lobes compared to the upper lobes. There is some associated tree-in-bud opacities again more prevalent on the right compared with the left particularly in the right middle and lower lobes. No pathologically enlarged mediastinal adenopathy. No pleural  effusion. No pericardial effusion. Nodule seemed to increase in size since CT imaging in May.  ESOPHAGRAM/BARIUM SWALLOW 04/08/17 (per radiologist):  Normal esophagram.  CT CHEST W/ CONTRAST 02/26/17 (previously reviewed by me):  No pleural effusion or thickening. No pathologic mediastinal adenopathy. No pericardial effusion. Patchy nodularity present most prevalently within posterior segment right upper lobe, lateral segment of right middle lobe, and right lower lobe.  MICROBIOLOGY Sputum Culture (08/18/17):  Normal Oral Flora / Aspergillus fumigatus / AFB Pending (ID by sequencing pending)  LABS 04/04/17 IgG: 1066 IgA: 135 IgM: 153 IgE: 15 RAST panel: Negative ANA:  1:80 RF:  <14 Anti-CCP:  <16 SSA:  <1.0 SSB:  <1.0  08/17/16 ANA: Negative Rheumatoid factor:  <14    Assessment & Plan:  61 y.o. female with underlying bronchiectasis and multiple lung nodules that appear to be somewhat progressive on repeat CT imaging. Sputum culture has grown out Aspergillus fumigatus and also nontuberculous mycobacteria. I'm awaiting identification by DNA sequencing of the nontuberculous mycobacteria. Symptomatically she is improving slightly on Breo. We did briefly discuss the potential need for prolonged treatment if necessary of her underlying atypical infection. She is continuing to have significant symptoms from her allergic rhinitis and as such I'm trying her on an alternative nasal spray.  1. Bronchiectasis: Continuing Breo and airway clearance with flutter valve. Sitting in a prescription for Ventolin inhaler. Recommended twice daily Mucinex/guaifenesin as a mucolytic. 2. Multiple lung nodules: Likely atypical infection. Awaiting finalization of sputum culture. Referring patient to ID. 3. GERD: Controlled with Protonix. No changes. 4. Chronic seasonal allergic rhinitis: Continuing Xyzal. Given sample of Dymista to try in place of Flonase. She will contact me for prescription if this is more  effective. 5. Health maintenance:  Status post influenza vaccine October 2018, Prevnar October 2016, & Tdap October 2016. 6. Follow-up: Return to clinic in 3 months or sooner if needed.  Sonia Baller Ashok Cordia, M.D. [facility] Pager:  [phone] After 3pm or if no response, call [phone] 2:26 PM 09/09/17

## 2017-09-12 ENCOUNTER — Telehealth: Payer: Self-pay | Admitting: Pulmonary Disease

## 2017-09-12 MED ORDER — AZELASTINE-FLUTICASONE 137-50 MCG/ACT NA SUSP: 1.0000 | Freq: Two times a day (BID) | NASAL | 3 refills | Status: DC

## 2017-09-12 NOTE — Telephone Encounter (Signed)
Ventolin - 1-2 puffs every 4 hours as needed for dyspnea, cough, or wheezing. #1 with 3 refills.

## 2017-09-12 NOTE — Telephone Encounter (Signed)
Spoke with pt and advised rx sent to pharmacy.  Spoke with pt she would like the gray inhaler (she did not know the name of it)) she showed you in the room because it didn't make her jittery like the Proair does. JN do you remember which inhaler she is referring to? She would like a Rx for it. Please advise.

## 2017-09-13 NOTE — Telephone Encounter (Signed)
ATC pt, unable to leave VM due to mailbox not being setup. Will call back.

## 2017-09-17 MED ORDER — ALBUTEROL SULFATE HFA 108 (90 BASE) MCG/ACT IN AERS: 2.0000 | INHALATION_SPRAY | Freq: Four times a day (QID) | RESPIRATORY_TRACT | 6 refills | Status: DC | PRN

## 2017-09-17 NOTE — Telephone Encounter (Signed)
Ventolin rx has been sent in at pt request to preferred pharmacy.   atc pt X2, no answer, vm not set up.  Wcb.

## 2017-09-18 NOTE — Telephone Encounter (Signed)
Spoke with Melody [name] at [facility] patient gets Rx's delivered to her and they have both Rx's going out today. Nothing more needed at this time.

## 2017-10-07 LAB — ACID FAST SMEAR (AFB): ACID FAST SMEAR - AFSCU2: POSITIVE — AB

## 2017-10-07 LAB — FUNGUS CULTURE WITH STAIN

## 2017-10-07 LAB — FUNGAL ORGANISM REFLEX

## 2017-10-07 LAB — FUNGUS CULTURE RESULT

## 2017-10-14 ENCOUNTER — Ambulatory Visit: Payer: Self-pay | Admitting: *Deleted

## 2017-10-14 NOTE — Telephone Encounter (Signed)
C/o pain in lower back on the right side and going down her right leg if she moves the wrong way. Sometimes just lying down and still has pain.  Taking percocet for pain and using heat to lower back.  Home care advice given to patient. Appointment made.  Reason for Disposition . [1] Pain radiates into the thigh or further down the leg AND [2] one leg  Answer Assessment - Initial Assessment Questions 1. ONSET: "When did the pain begin?"      3 days  2. LOCATION: "Where does it hurt?" (upper, mid or lower back)     Lower back mostly on the right 3. SEVERITY: "How bad is the pain?"  (e.g., Scale 1-10; mild, moderate, or severe)   - MILD (1-3): doesn't interfere with normal activities    - MODERATE (4-7): interferes with normal activities or awakens from sleep    - SEVERE (8-10): excruciating pain, unable to do any normal activities      10 4. PATTERN: "Is the pain constant?" (e.g., yes, no; constant, intermittent)      Intermittent 5. RADIATION: "Does the pain shoot into your legs or elsewhere?"     Shoots down her right leg 6. CAUSE:  "What do you think is causing the back pain?"      Not sure 7. BACK OVERUSE:  "Any recent lifting of heavy objects, strenuous work or exercise?"     Lifting heavy wood 8. MEDICATIONS: "What have you taken so far for the pain?" (e.g., nothing, acetaminophen, NSAIDS)     Percocet helps some 9. NEUROLOGIC SYMPTOMS: "Do you have any weakness, numbness, or problems with bowel/bladder control?"     no 10. OTHER SYMPTOMS: "Do you have any other symptoms?" (e.g., fever, abdominal pain, burning with urination, blood in urine)       Abdominal pain which she thinks is coming from her back and not her gyn surgery. 11. PREGNANCY: "Is there any chance you are pregnant?" (e.g., yes, no; LMP)       hysterectomy about 2 years ago  Protocols used: BACK PAIN-A-AH

## 2017-10-16 ENCOUNTER — Encounter: Payer: Self-pay | Admitting: Family

## 2017-10-16 ENCOUNTER — Ambulatory Visit (INDEPENDENT_AMBULATORY_CARE_PROVIDER_SITE_OTHER)
Admission: RE | Admit: 2017-10-16 | Discharge: 2017-10-16 | Disposition: A | Discharge status: Home / Self Care | Payer: Medicare Other | Source: Ambulatory Visit | Attending: Family | Admitting: Family

## 2017-10-16 ENCOUNTER — Ambulatory Visit (INDEPENDENT_AMBULATORY_CARE_PROVIDER_SITE_OTHER): Payer: Medicare Other | Admitting: Family

## 2017-10-16 ENCOUNTER — Ambulatory Visit (INDEPENDENT_AMBULATORY_CARE_PROVIDER_SITE_OTHER): Payer: Medicare Other | Admitting: Infectious Diseases

## 2017-10-16 ENCOUNTER — Encounter: Payer: Self-pay | Admitting: Infectious Diseases

## 2017-10-16 VITALS — BP 124/64 | HR 67 | Temp 97.6°F | Ht 66.0 in | Wt 148.1 lb

## 2017-10-16 DIAGNOSIS — A310 Pulmonary mycobacterial infection: Secondary | ICD-10-CM | POA: Insufficient documentation

## 2017-10-16 DIAGNOSIS — M5136 Other intervertebral disc degeneration, lumbar region: Secondary | ICD-10-CM | POA: Diagnosis not present

## 2017-10-16 DIAGNOSIS — M5441 Lumbago with sciatica, right side: Secondary | ICD-10-CM | POA: Diagnosis not present

## 2017-10-16 LAB — MAC SUSCEPTIBILITY BROTH
Amikacin: 8
Ciprofloxacin: 8
Streptomycin: 16

## 2017-10-16 LAB — AFB ORGANISM ID BY DNA PROBE
M AVIUM COMPLEX: NEGATIVE
M TUBERCULOSIS COMPLEX: NEGATIVE

## 2017-10-16 LAB — ORG ID BY SEQUENCING RFLX AST

## 2017-10-16 LAB — ACID FAST CULTURE WITH REFLEXED SENSITIVITIES (MYCOBACTERIA): Acid Fast Culture: POSITIVE — AB

## 2017-10-16 MED ORDER — PREDNISONE 10 MG (21) PO TBPK: ORAL_TABLET | ORAL | 0 refills | Status: DC

## 2017-10-16 NOTE — Progress Notes (Signed)
Melody [name] is a 61 y.o. female with the following history as recorded in EpicCare:  Patient Active Problem List   Diagnosis Date Noted  . Bursitis 05/21/2017  . Knee pain, chronic 05/21/2017  . Cough 04/04/2017  . Multiple lung nodules on CT 04/04/2017  . Change in voice 04/04/2017  . Fatigue 02/08/2016  . Complete uterovaginal prolapse 01/10/2016  . Status post total abdominal hysterectomy and bilateral salpingo-oophorectomy 01/10/2016  . Urinary incontinence 11/09/2015  . Vaginal discomfort 08/10/2015  . Rectocele 08/10/2015  . Well adult exam 09/03/2014  . Chronic obstructive airway disease with asthma ([place]) 09/03/2014  . UTI (urinary tract infection) 06/09/2014  . Dysplastic toenail 09/02/2013  . Aphthous ulcer 06/11/2013  . Dark urine 03/18/2013  . Rash 06/08/2012  . PRURITUS 12/01/2010  . EXPOSURE TO HAZARDOUS BODY FLUID, HX OF 05/29/2010  . HIP PAIN 12/05/2009  . RUQ PAIN 02/04/2009  . WRIST PAIN 05/24/2008  . CERVICAL STRAIN 05/24/2008  . WARTS, VIRAL, UNSPECIFIED 04/06/2008  . NEOPLASM, SKIN, UNCERTAIN BEHAVIOR 92/08/9416  . Vitamin D deficiency 11/13/2007  . Hypothyroidism 08/05/2007  . Bipolar I disorder ([place]) 08/05/2007  . GERD 08/05/2007  . Anxiety state 07/23/2007  . LOW BACK PAIN 07/23/2007  . Myalgia and myositis 07/23/2007  . B12 deficiency 05/22/2007  . IRON DEFICIENCY 05/22/2007    Current Outpatient Medications  Medication Sig Dispense Refill  . albuterol (PROVENTIL HFA;VENTOLIN HFA) 108 (90 Base) MCG/ACT inhaler Inhale 2 puffs every 6 (six) hours as needed into the lungs for wheezing or shortness of breath. 1 Inhaler 6  . ALPRAZolam (XANAX) 1 MG tablet Take 1 mg by mouth 4 (four) times daily.     . Azelastine-Fluticasone (DYMISTA) 137-50 MCG/ACT SUSP Place 1 spray 2 (two) times daily into the nose. 1 Bottle 3  . cholecalciferol (VITAMIN D) 1000 units tablet Take 1,000 Units by mouth once a week.    . cyanocobalamin (,VITAMIN B-12,) 1000 MCG/ML  injection INJECT 1 ML INTO THE MUSCLE EVERY 14 DAYS. 2 mL 11  . fluticasone (FLONASE) 50 MCG/ACT nasal spray USE 2 SPRAYS IN BOTH NOSTRILS DAILY. 16 g 11  . fluticasone furoate-vilanterol (BREO ELLIPTA) 100-25 MCG/INH AEPB Inhale 1 puff into the lungs daily. 60 each 5  . levocetirizine (XYZAL) 5 MG tablet TAKE 1 TABLET BY MOUTH EVERY EVENING. 30 tablet 11  . levothyroxine (SYNTHROID, LEVOTHROID) 50 MCG tablet Take 1 tablet (50 mcg total) by mouth daily before breakfast. 30 tablet 11  . methylphenidate (RITALIN) 20 MG tablet TAKE ONE TABLET BY MOUTH EVERY MORNING AND AT NOON  0  . oxyCODONE-acetaminophen (PERCOCET/ROXICET) 5-325 MG tablet Take 1 tablet by mouth 2 (two) times daily as needed for severe pain. 60 tablet 0  . pantoprazole (PROTONIX) 40 MG tablet Take 1 tablet (40 mg total) by mouth daily. 30 tablet 5  . QUEtiapine (SEROQUEL) 25 MG tablet Take 25 mg by mouth at bedtime.     Marland Kitchen Respiratory Therapy Supplies (FLUTTER) DEVI Use as directed 1 each 0  . sertraline (ZOLOFT) 25 MG tablet Take 25 mg by mouth daily.  99  . triamcinolone ointment (KENALOG) 0.1 % Apply 1 application topically 2 (two) times daily. 80 g 3  . predniSONE (STERAPRED UNI-PAK 21 TAB) 10 MG (21) TBPK tablet Please take as directed 21 tablet 0   No current facility-administered medications for this visit.     Allergies: Doxycycline hyclate; Nabumetone; Sulfa antibiotics; Tetracycline hcl; and Tramadol hcl  Past Medical History:  Diagnosis Date  .  Anxiety   . Bronchiectasis ([place])   . Chronic back pain   . Chronic neck pain   . Depression    bipolar- Dr [name]  . Fatigue   . Fibromyalgia   . GERD (gastroesophageal reflux disease)   . Headache   . Hypothyroidism   . Low back pain    Dr Arnoldo Morale  . Osteoarthritis   . Pneumonia   . Pulmonary nodule   . Sinus congestion   . Thyroid disease    hypothyroidsm  . Vertigo   . Vitamin B12 deficiency     Past Surgical History:  Procedure Laterality Date  . ABDOMINAL  HYSTERECTOMY N/A 01/10/2016   Procedure: HYSTERECTOMY ABDOMINAL;  Surgeon: Nunzio Cobbs, MD;  Location: [facility];  Service: Gynecology;  Laterality: N/A;  . ABDOMINAL SACROCOLPOPEXY N/A 01/10/2016   Procedure: ABDOMINO SACROCOLPOPEXY ;  Surgeon: Nunzio Cobbs, MD;  Location: [facility];  Service: Gynecology;  Laterality: N/A;  . ANTERIOR AND POSTERIOR REPAIR N/A 01/10/2016   Procedure:  POSTERIOR REPAIR (RECTOCELE);  Surgeon: Nunzio Cobbs, MD;  Location: [facility];  Service: Gynecology;  Laterality: N/A;  . BLADDER SUSPENSION N/A 01/10/2016   Procedure: TRANSVAGINAL TAPE (TVT) PROCEDURE exact midurethral sling;  Surgeon: Nunzio Cobbs, MD;  Location: [facility];  Service: Gynecology;  Laterality: N/A;  . CERVICAL LAMINECTOMY  2001 and 2995   Dr Arnoldo Morale  . CYSTO N/A 01/10/2016   Procedure: CYSTOSCOPY;  Surgeon: Nunzio Cobbs, MD;  Location: [facility];  Service: Gynecology;  Laterality: N/A;  . SALPINGOOPHORECTOMY Bilateral 01/10/2016   Procedure: BILATERAL SALPINGO OOPHORECTOMY;  Surgeon: Nunzio Cobbs, MD;  Location: [facility];  Service: Gynecology;  Laterality: Bilateral;  . TUBAL LIGATION  1980    Family History  Problem Relation Age of Onset  . Heart attack Father        dec heart disease age 16  . Hypertension Father   . Lung disease Father   . Cancer Mother        dec stomach ca--age 43  . Breast cancer Cousin   . Arthritis Other   . Breast cancer Paternal Aunt     Social History   Tobacco Use  . Smoking status: Former Smoker    Packs/day: 1.00    Years: 35.00    Pack years: 35.00    Types: Cigarettes    Start date: 11/21/1962    Last attempt to quit: 05/06/2011    Years since quitting: 6.4  . Smokeless tobacco: Never Used  . Tobacco comment: stopped periodically - peak rate of 1 ppd  Substance Use Topics  . Alcohol use: No    Alcohol/week: 0.0 oz    Subjective:  Injured her back while lifting wood at her home last week; notes  that she has been experiencing persistent low back pain with radiating symptoms into right leg; symptoms are worse when changing positions; does get some relief with the Percocet she uses for her chronic knee pain; denies any changes in bowel or bladder habits; has had neck surgery in the past but no lumbar surgery.   Objective:  Vitals:   10/16/17 1139  BP: 124/64  Pulse: 67  Temp: 97.6 F (36.4 C)  TempSrc: Oral  SpO2: 100%  Weight: 148 lb 1.3 oz (67.2 kg)  Height: 5\' 6"  (1.676 m)    General: Well developed, well nourished, in no acute distress  Skin : Warm and  dry.  Head: Normocephalic and atraumatic  Lungs: Respirations unlabored; clear to auscultation bilaterally without wheeze, rales, rhonchi  CVS exam: normal rate and regular rhythm.  Musculoskeletal: No deformities; no active joint inflammation  Extremities: No edema, cyanosis, clubbing  Vessels: Symmetric bilaterally  Neurologic: Alert and oriented; speech intact; face symmetrical; moves all extremities well; CNII-XII intact without focal deficit   Assessment:  1. Acute right-sided low back pain with right-sided sciatica     Plan:  Check lumbar X-ray today; Rx for Prednisone taper pack- take as directed; she can continue to use her Percocet as needed for pain; follow up to be determined.  No Follow-up on file.  Orders Placed This Encounter  Procedures  . DG Lumbar Spine Complete    Standing Status:   Future    Number of Occurrences:   1    Standing Expiration Date:   12/17/2018    Order Specific Question:   Reason for Exam (SYMPTOM  OR DIAGNOSIS REQUIRED)    Answer:   low back pain with radiating symptoms    Order Specific Question:   Preferred imaging location?    Answer:   [facility]    Order Specific Question:   Radiology Contrast Protocol - do NOT remove file path    Answer:   [email or web address]    Requested Prescriptions   Signed Prescriptions Disp Refills   . predniSONE (STERAPRED UNI-PAK 21 TAB) 10 MG (21) TBPK tablet 21 tablet 0    Sig: Please take as directed

## 2017-10-16 NOTE — Assessment & Plan Note (Signed)
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 She needs either another positive sputum (wihtin 12 months of first) or BAL+ Will see if we can get another sputum. If she is unable, will have her seen by CCM for BAL.  Will hold off on rx til we have this She has ophtho appt next month.  Explained drug regimen to pt ad potential sfx.  Will see her back in 2 months

## 2017-10-16 NOTE — Progress Notes (Signed)
   Subjective:    Patient ID: Melody [name], female    DOB: [birth year], 61 y.o.   MRN: [id number]  HPI 61 yo F with hx of sciatica, lung nodules. She was first found to have these when she was adm to [facility] with pneumonia in February 2018.  She had CT scan chest 06-2017;  1. 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 2. Aortic atherosclerosis.  She has had f/u with [facility] and had sputum Cx (08-18-17) that grew A fumigatus, Candida as well as MAI found by PCR (sensi not resulted).    The past medical history, family history and social history were reviewed/updated in [place].   Review of Systems  Constitutional: Negative for appetite change, chills, fever and unexpected weight change.  HENT: Positive for postnasal drip and voice change.   Respiratory: Negative for cough and shortness of breath.   Gastrointestinal: Negative for constipation and diarrhea.  Genitourinary: Negative for difficulty urinating.  Musculoskeletal: Positive for back pain.  Hematological: Negative for adenopathy.  Please see HPI. All other systems reviewed and negative. Can't remember colonoscopy.      Objective:   Physical Exam  Constitutional: She appears well-developed and well-nourished.  HENT:  Mouth/Throat: No oropharyngeal exudate.  Eyes: EOM are normal. Pupils are equal, round, and reactive to Leisner.  Neck: Neck supple.  Cardiovascular: Normal rate, regular rhythm and normal heart sounds.  Pulmonary/Chest: Effort normal and breath sounds normal.  Abdominal: Soft. Bowel sounds are normal. There is no tenderness. There is no rebound.  Musculoskeletal: She exhibits no edema.  Lymphadenopathy:    She has no cervical adenopathy.    She has no axillary adenopathy.       Right: No supraclavicular adenopathy present.       Left: No supraclavicular adenopathy present.      Assessment & Plan:

## 2017-10-24 ENCOUNTER — Telehealth: Payer: Self-pay | Admitting: Internal Medicine

## 2017-10-24 NOTE — Telephone Encounter (Signed)
Pt complaining of back pain that has not improved. Pt was seen on 12/19 for same complaints. Pt states she has completed prednisone that was prescribed and states the pain never went away. Advised pt to take Percocet that was prescribed for pain, pt states she does not want to take the medication because she does not want to get hooked on the medication. Pt states she last took percocet on yesterday. Pt encouraged to take percocet to help relieve pain. Advised pt that if pain continues she would need to go to the ER to be treated, but pt states that she will not go to the ER because she does not trust them. Appt made for 12/28.

## 2017-10-25 ENCOUNTER — Ambulatory Visit (INDEPENDENT_AMBULATORY_CARE_PROVIDER_SITE_OTHER): Payer: Medicare Other | Admitting: Family

## 2017-10-25 ENCOUNTER — Encounter: Payer: Self-pay | Admitting: Family

## 2017-10-25 VITALS — BP 132/80 | HR 83 | Temp 98.5°F | Resp 16 | Ht 66.0 in | Wt 151.0 lb

## 2017-10-25 DIAGNOSIS — M5441 Lumbago with sciatica, right side: Secondary | ICD-10-CM | POA: Diagnosis not present

## 2017-10-25 DIAGNOSIS — G8929 Other chronic pain: Secondary | ICD-10-CM | POA: Diagnosis not present

## 2017-10-25 MED ORDER — GABAPENTIN 300 MG PO CAPS: 300.0000 mg | ORAL_CAPSULE | Freq: Two times a day (BID) | ORAL | 0 refills | Status: DC

## 2017-10-28 NOTE — Progress Notes (Signed)
Melody [name] is a 61 y.o. female with the following history as recorded in Epic[employer]:  Patient Active Problem List   Diagnosis Date Noted  . 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([place]) 10/16/2017  . Bursitis 05/21/2017  . Knee pain, chronic 05/21/2017  . Cough 04/04/2017  . Multiple lung nodules on CT 04/04/2017  . Change in voice 04/04/2017  . Fatigue 02/08/2016  . Complete uterovaginal prolapse 01/10/2016  . Status post total abdominal hysterectomy and bilateral salpingo-oophorectomy 01/10/2016  . Urinary incontinence 11/09/2015  . Vaginal discomfort 08/10/2015  . Rectocele 08/10/2015  . Well adult exam 09/03/2014  . Chronic obstructive airway disease with asthma ([place]) 09/03/2014  . UTI (urinary tract infection) 06/09/2014  . Dysplastic toenail 09/02/2013  . Aphthous ulcer 06/11/2013  . Dark urine 03/18/2013  . Rash 06/08/2012  . PRURITUS 12/01/2010  . EXPOSURE TO HAZARDOUS BODY FLUID, HX OF 05/29/2010  . HIP PAIN 12/05/2009  . RUQ PAIN 02/04/2009  . WRIST PAIN 05/24/2008  . CERVICAL STRAIN 05/24/2008  . WARTS, VIRAL, UNSPECIFIED 04/06/2008  . NEOPLASM, SKIN, UNCERTAIN BEHAVIOR 96/78/9381  . Vitamin D deficiency 11/13/2007  . Hypothyroidism 08/05/2007  . Bipolar I disorder ([place]) 08/05/2007  . GERD 08/05/2007  . Anxiety state 07/23/2007  . LOW BACK PAIN 07/23/2007  . Myalgia and myositis 07/23/2007  . B12 deficiency 05/22/2007  . IRON DEFICIENCY 05/22/2007    Current Outpatient Medications  Medication Sig Dispense Refill  . albuterol (PROVENTIL HFA;VENTOLIN HFA) 108 (90 Base) MCG/ACT inhaler Inhale 2 puffs every 6 (six) hours as needed into the lungs for wheezing or shortness of breath. 1 Inhaler 6  . ALPRAZolam (XANAX) 1 MG tablet Take 1 mg by mouth 4 (four) times daily.     . Azelastine-Fluticasone (DYMISTA) 137-50 MCG/ACT SUSP Place 1 spray 2 (two) times daily into the nose. 1 Bottle 3  . cholecalciferol (VITAMIN D) 1000 units tablet Take 1,000 Units by mouth  once a week.    . cyanocobalamin (,VITAMIN B-12,) 1000 MCG/ML injection INJECT 1 ML INTO THE MUSCLE EVERY 14 DAYS. 2 mL 11  . fluticasone (FLONASE) 50 MCG/ACT nasal spray USE 2 SPRAYS IN BOTH NOSTRILS DAILY. 16 g 11  . fluticasone furoate-vilanterol (BREO ELLIPTA) 100-25 MCG/INH AEPB Inhale 1 puff into the lungs daily. 60 each 5  . levocetirizine (XYZAL) 5 MG tablet TAKE 1 TABLET BY MOUTH EVERY EVENING. 30 tablet 11  . levothyroxine (SYNTHROID, LEVOTHROID) 50 MCG tablet Take 1 tablet (50 mcg total) by mouth daily before breakfast. 30 tablet 11  . methylphenidate (RITALIN) 20 MG tablet TAKE ONE TABLET BY MOUTH EVERY MORNING AND AT NOON  0  . oxyCODONE-acetaminophen (PERCOCET/ROXICET) 5-325 MG tablet Take 1 tablet by mouth 2 (two) times daily as needed for severe pain. 60 tablet 0  . pantoprazole (PROTONIX) 40 MG tablet Take 1 tablet (40 mg total) by mouth daily. 30 tablet 5  . QUEtiapine (SEROQUEL) 25 MG tablet Take 25 mg by mouth at bedtime.     Marland Kitchen Respiratory Therapy Supplies (FLUTTER) DEVI Use as directed 1 each 0  . sertraline (ZOLOFT) 25 MG tablet Take 25 mg by mouth daily.  99  . triamcinolone ointment (KENALOG) 0.1 % Apply 1 application topically 2 (two) times daily. 80 g 3  . gabapentin (NEURONTIN) 300 MG capsule Take 1 capsule (300 mg total) by mouth 2 (two) times daily. 60 capsule 0   No current facility-administered medications for this visit.     Allergies: Doxycycline hyclate; Nabumetone; Sulfa antibiotics; Tetracycline hcl; and Tramadol  hcl  Past Medical History:  Diagnosis Date  . Anxiety   . Bronchiectasis ([place])   . Chronic back pain   . Chronic neck pain   . Depression    bipolar- Dr [name]  . Fatigue   . Fibromyalgia   . GERD (gastroesophageal reflux disease)   . Headache   . Hypothyroidism   . Low back pain    Dr Arnoldo Morale  . 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([place])   . Osteoarthritis   . Pneumonia   . Pulmonary nodule   . Sinus congestion   . Thyroid disease     hypothyroidsm  . Vertigo   . Vitamin B12 deficiency     Past Surgical History:  Procedure Laterality Date  . ABDOMINAL HYSTERECTOMY N/A 01/10/2016   Procedure: HYSTERECTOMY ABDOMINAL;  Surgeon: Nunzio Cobbs, MD;  Location: [facility];  Service: Gynecology;  Laterality: N/A;  . ABDOMINAL SACROCOLPOPEXY N/A 01/10/2016   Procedure: ABDOMINO SACROCOLPOPEXY ;  Surgeon: Nunzio Cobbs, MD;  Location: [facility];  Service: Gynecology;  Laterality: N/A;  . ANTERIOR AND POSTERIOR REPAIR N/A 01/10/2016   Procedure:  POSTERIOR REPAIR (RECTOCELE);  Surgeon: Nunzio Cobbs, MD;  Location: [facility];  Service: Gynecology;  Laterality: N/A;  . BLADDER SUSPENSION N/A 01/10/2016   Procedure: TRANSVAGINAL TAPE (TVT) PROCEDURE exact midurethral sling;  Surgeon: Nunzio Cobbs, MD;  Location: [facility];  Service: Gynecology;  Laterality: N/A;  . CERVICAL LAMINECTOMY  2001 and 2995   Dr Arnoldo Morale  . CYSTO N/A 01/10/2016   Procedure: CYSTOSCOPY;  Surgeon: Nunzio Cobbs, MD;  Location: [facility];  Service: Gynecology;  Laterality: N/A;  . SALPINGOOPHORECTOMY Bilateral 01/10/2016   Procedure: BILATERAL SALPINGO OOPHORECTOMY;  Surgeon: Nunzio Cobbs, MD;  Location: [facility];  Service: Gynecology;  Laterality: Bilateral;  . TUBAL LIGATION  1980    Family History  Problem Relation Age of Onset  . Heart attack Father        dec heart disease age 67  . Hypertension Father   . Lung disease Father   . Cancer Mother        dec stomach ca--age 3  . Breast cancer Cousin   . Arthritis Other   . Breast cancer Paternal Aunt     Social History   Tobacco Use  . Smoking status: Former Smoker    Packs/day: 1.00    Years: 35.00    Pack years: 35.00    Types: Cigarettes    Start date: 11/21/1962    Last attempt to quit: 05/06/2011    Years since quitting: 6.4  . Smokeless tobacco: Never Used  . Tobacco comment: stopped periodically - peak rate of 1 ppd  Substance Use Topics   . Alcohol use: No    Alcohol/week: 0.0 oz    Subjective:  Patient was seen on 10/16/17 with concerns about radiating low back pain; was treated with prednisone pack and recommended to see a back specialist; returns with same symptoms; notes that she did finish prednisone with some relief but pain is still present; already has Rx for Percocet but notes this pain feels "like a fire" in my legs;   Objective:  Vitals:   10/25/17 1517  BP: 132/80  Pulse: 83  Resp: 16  Temp: 98.5 F (36.9 C)  TempSrc: Oral  SpO2: 97%  Weight: 151 lb (68.5 kg)  Height: 5\' 6"  (1.676 m)    General: Well developed, well  nourished, in no acute distress  Skin : Warm and dry.  Head: Normocephalic and atraumatic  Lungs: Respirations unlabored; clear to auscultation bilaterally without wheeze, rales, rhonchi  Musculoskeletal: No deformities; no active joint inflammation  Extremities: No edema, cyanosis, clubbing  Vessels: Symmetric bilaterally  Neurologic: Alert and oriented; speech intact; face symmetrical; moves all extremities well; CNII-XII intact without focal deficit  Assessment:  1. Chronic right-sided low back pain with right-sided sciatica     Plan:  Will update order for lumbar MRI; will start Gabapentin 300 mg qd x 3 days and then increase to bid; she is referred to sports medicine provider here- may eventually need orthopedist; follow-up to be determined.   Return for with Dr. Tamala Julian or [place].  Orders Placed This Encounter  Procedures  . MR Lumbar Spine Wo Contrast    Standing Status:   Future    Standing Expiration Date:   12/26/2018    Order Specific Question:   What is the patient's sedation requirement?    Answer:   No Sedation    Order Specific Question:   Does the patient have a pacemaker or implanted devices?    Answer:   No    Order Specific Question:   Preferred imaging location?    Answer:   [facility] (table limit-350lbs)    Order Specific Question:   Radiology Contrast  Protocol - do NOT remove file path    Answer:   [email or web address]    Requested Prescriptions   Signed Prescriptions Disp Refills  . gabapentin (NEURONTIN) 300 MG capsule 60 capsule 0    Sig: Take 1 capsule (300 mg total) by mouth 2 (two) times daily.

## 2017-11-04 ENCOUNTER — Encounter: Payer: Self-pay | Admitting: Family

## 2017-11-12 ENCOUNTER — Encounter: Payer: Self-pay | Admitting: Family Medicine

## 2017-11-12 ENCOUNTER — Encounter: Payer: Self-pay | Admitting: Internal Medicine

## 2017-11-12 ENCOUNTER — Ambulatory Visit (INDEPENDENT_AMBULATORY_CARE_PROVIDER_SITE_OTHER): Payer: Medicare Other | Admitting: Family Medicine

## 2017-11-12 ENCOUNTER — Ambulatory Visit (INDEPENDENT_AMBULATORY_CARE_PROVIDER_SITE_OTHER): Payer: Medicare Other | Admitting: Internal Medicine

## 2017-11-12 DIAGNOSIS — M545 Low back pain, unspecified: Secondary | ICD-10-CM

## 2017-11-12 DIAGNOSIS — R918 Other nonspecific abnormal finding of lung field: Secondary | ICD-10-CM

## 2017-11-12 DIAGNOSIS — L255 Unspecified contact dermatitis due to plants, except food: Secondary | ICD-10-CM | POA: Diagnosis not present

## 2017-11-12 DIAGNOSIS — E034 Atrophy of thyroid (acquired): Secondary | ICD-10-CM

## 2017-11-12 MED ORDER — PREDNISONE 10 MG PO TABS: ORAL_TABLET | ORAL | 1 refills | Status: DC

## 2017-11-12 MED ORDER — OXYCODONE-ACETAMINOPHEN 5-325 MG PO TABS: 1.0000 | ORAL_TABLET | Freq: Two times a day (BID) | ORAL | 0 refills | Status: DC | PRN

## 2017-11-12 MED ORDER — VITAMIN D (ERGOCALCIFEROL) 1.25 MG (50000 UNIT) PO CAPS: 50000.0000 [IU] | ORAL_CAPSULE | ORAL | 0 refills | Status: DC

## 2017-11-12 MED ORDER — CEFDINIR 300 MG PO CAPS: 300.0000 mg | ORAL_CAPSULE | Freq: Two times a day (BID) | ORAL | 0 refills | Status: DC

## 2017-11-12 NOTE — Progress Notes (Signed)
[name] [address], [place] [address] Phone: [phone] Subjective:      CC: Back pain  QMG:[id number]  Melody [name] is a 62 y.o. female coming in for lower back pain. She had surgery 2 years ago and her back has hurt since. She has pain in the right glute. Her pain radiates down the side and front of her leg. Her pain is intermittent. She has tried ice and heat. She takes percocet for her pain. She was taking gabapentin but stopped taking it because she developed migraine headaches.  Patient continues to have pain in the back.  Patient describes it as a dull, throbbing aching sensation with some mild intermittent pain going down the legs.  She needs them pain medications fairly regularly now.  Taking it most days of the week.  Denies any weakness of the lower extremities but states that she does has overall fatigue.  Patient did have x-rays taken October 16, 2017.  These were independently visualized by me showing through L5 some facet arthropathy at L5-S1.  Patient does have an MRI pending.    Past Medical History:  Diagnosis Date  . Anxiety   . Bronchiectasis ([place])   . Chronic back pain   . Chronic neck pain   . Depression    bipolar- Dr [name]  . Fatigue   . Fibromyalgia   . GERD (gastroesophageal reflux disease)   . Headache   . Hypothyroidism   . Low back pain    Dr Arnoldo Morale  . 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([place])   . Osteoarthritis   . Pneumonia   . Pulmonary nodule   . Sinus congestion   . Thyroid disease    hypothyroidsm  . Vertigo   . Vitamin B12 deficiency    Past Surgical History:  Procedure Laterality Date  . ABDOMINAL HYSTERECTOMY N/A 01/10/2016   Procedure: HYSTERECTOMY ABDOMINAL;  Surgeon: Nunzio Cobbs, MD;  Location: [facility];  Service: Gynecology;  Laterality: N/A;  . ABDOMINAL SACROCOLPOPEXY N/A 01/10/2016   Procedure: ABDOMINO SACROCOLPOPEXY ;  Surgeon: Nunzio Cobbs, MD;   Location: [facility];  Service: Gynecology;  Laterality: N/A;  . ANTERIOR AND POSTERIOR REPAIR N/A 01/10/2016   Procedure:  POSTERIOR REPAIR (RECTOCELE);  Surgeon: Nunzio Cobbs, MD;  Location: [facility];  Service: Gynecology;  Laterality: N/A;  . BLADDER SUSPENSION N/A 01/10/2016   Procedure: TRANSVAGINAL TAPE (TVT) PROCEDURE exact midurethral sling;  Surgeon: Nunzio Cobbs, MD;  Location: [facility];  Service: Gynecology;  Laterality: N/A;  . CERVICAL LAMINECTOMY  2001 and 2995   Dr Arnoldo Morale  . CYSTO N/A 01/10/2016   Procedure: CYSTOSCOPY;  Surgeon: Nunzio Cobbs, MD;  Location: [facility];  Service: Gynecology;  Laterality: N/A;  . SALPINGOOPHORECTOMY Bilateral 01/10/2016   Procedure: BILATERAL SALPINGO OOPHORECTOMY;  Surgeon: Nunzio Cobbs, MD;  Location: [facility];  Service: Gynecology;  Laterality: Bilateral;  . TUBAL LIGATION  1980   Social History   Socioeconomic History  . Marital status: Legally Separated    Spouse name: None  . Number of children: None  . Years of education: None  . Highest education level: None  Social Needs  . Financial resource strain: None  . Food insecurity - worry: None  . Food insecurity - inability: None  . Transportation needs - medical: None  . Transportation needs - non-medical: None  Occupational History  . None  Tobacco Use  . Smoking status: Former Smoker    Packs/day: 1.00    Years: 35.00    Pack years: 35.00    Types: Cigarettes    Start date: 11/21/1962    Last attempt to quit: 05/06/2011    Years since quitting: 6.5  . Smokeless tobacco: Never Used  . Tobacco comment: stopped periodically - peak rate of 1 ppd  Substance and Sexual Activity  . Alcohol use: No    Alcohol/week: 0.0 oz  . Drug use: No  . Sexual activity: Yes    Birth control/protection: Post-menopausal, Surgical    Comment: Tubal/Hyst/BSO  Other Topics Concern  . None  Social History Narrative   [facility] (04/04/17):   Originally from  [place], [place]. 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 Recently had a persian cat that passed. No bird exposure. She denies any indoor plants currently. She does use wood burning for heat. She reports there is water getting under her foundation. She has mold in her home that she reports is in the 2nd floor of her home. She has recently started seeing mold under her bed. She reports her home smells musty & damp. She has always lived in [place].    Allergies  Allergen Reactions  . Doxycycline Hyclate Itching  . Nabumetone Other (See Comments)    Headache  . Sulfa Antibiotics Other (See Comments)    Reaction is unknown  . Tetracycline Hcl Itching  . Tramadol Hcl Other (See Comments)    Reaction is unknown   Family History  Problem Relation Age of Onset  . Heart attack Father        dec heart disease age 51  . Hypertension Father   . Lung disease Father   . Cancer Mother        dec stomach ca--age 44  . Breast cancer Cousin   . Arthritis Other   . Breast cancer Paternal Aunt      Past medical history, social, surgical and family history all reviewed in electronic medical record.  No pertanent information unless stated regarding to the chief complaint.   Review of Systems:Review of systems updated and as accurate as of 11/12/17  No , visual changes, nausea, vomiting, diarrhea, constipation, dizziness, abdominal pain, skin rash, fevers, chills, night sweats, weight loss, swollen lymph nodes, body aches, joint swelling,chest pain, shortness of breath, mood changes.  Positive muscle aches, cough, headache  Objective  Blood pressure 124/84, pulse 82, height 5\' 6"  (1.676 m), weight 148 lb (67.1 kg), last menstrual period 10/29/2006, SpO2 96 %. Systems examined below as of 11/12/17   General: No apparent distress alert and oriented x3 mood and affect normal, dressed appropriately.  HEENT: Pupils equal, extraocular movements intact    Respiratory: Patient's speak in full sentences and does not appear short of breath  Cardiovascular: No lower extremity edema, non tender, no erythema  Skin: Warm dry intact with no signs of infection or rash on extremities or on axial skeleton.  Abdomen: Soft nontender  Neuro: Cranial nerves II through XII are intact, neurovascularly intact in all extremities with 2+ DTRs and 2+ pulses.  Lymph: No lymphadenopathy of posterior or anterior cervical chain or axillae bilaterally.  Gait normal with good balance and coordination.  MSK:  Non tender with full range of motion and good stability and symmetric strength and tone of shoulders, elbows, wrist, hip, knee and ankles bilaterally.  Back Exam:  Inspection: Unremarkable  Motion: Flexion 45 deg, Extension 20 deg, Side Bending to 25 deg bilaterally,  Rotation to 35 deg bilaterally  SLR laying: Negative  XSLR laying: Negative  Palpable tenderness: Diffuse discomfort that seems to be out of proportion to the amount of pain.Marland Kitchen FABER: negative. Sensory change: Gross sensation intact to all lumbar and sacral dermatomes.  Reflexes: 2+ at both patellar tendons, 2+ at achilles tendons, Babinski's downgoing.  Strength at foot  Plantar-flexion: 5/5 Dorsi-flexion: 5/5 Eversion: 5/5 Inversion: 5/5  Leg strength  Quad: 5/5 Hamstring: 5/5 Hip flexor: 5/5 Hip abductors: 5/5  Gait unremarkable.  [id number]; 15 additional minutes spent for Therapeutic exercises as stated in above notes.  This included exercises focusing on stretching, strengthening, with significant focus on eccentric aspects.   Long term goals include an improvement in range of motion, strength, endurance as well as avoiding reinjury. Patient's frequency would include in 1-2 times a day, 3-5 times a week for a duration of 6-12 weeks. Low back exercises that included:  Pelvic tilt/bracing instruction to focus on control of the pelvic girdle and lower abdominal muscles  Glute strengthening exercises,  focusing on proper firing of the glutes without engaging the low back muscles Proper stretching techniques for maximum relief for the hamstrings, hip flexors, low back and some rotation where tolerated  Proper technique shown and discussed handout in great detail with ATC.  All questions were discussed and answered.     Impression and Recommendations:     This case required medical decision making of moderate complexity.      Note: This dictation was prepared with Dragon dictation along with smaller phrase technology. Any transcriptional errors that result from this process are unintentional.

## 2017-11-12 NOTE — Assessment & Plan Note (Addendum)
MAI F/u w/pulmonary

## 2017-11-12 NOTE — Assessment & Plan Note (Signed)
Patient does have low back pain.  Getting chronic pain medications from primary care provider.  We discussed home exercises and work with Product/process development scientist.  We discussed icing regimen, home exercise, which activities of doing which wants to avoid.  Encourage patient to try to be active but patient states that the pain hurts overall.  We discussed different medications which patient was not willing to try.  Patient declined formal physical therapy as well.  Follow-up again in 4 weeks

## 2017-11-12 NOTE — Patient Instructions (Addendum)
Good to see you  Alvera Singh is your friend. Ice 20 minutes 2 times daily. Usually after activity and before bed. pennsaid pinkie amount topically 2 times daily as needed.  Exercises 3 times a week.   Once weekly vitamin D for 12 weeks  Over the counter try  Turmeric 500mg  daily  Tart cherry extract any dose.  See me again in 6 weeks

## 2017-11-12 NOTE — Assessment & Plan Note (Signed)
a skin rash on chest and abdomen x 2 weeks after carrying wood into a house. Cat had fleas - eliminated. No scabies, bed bugs exposure...   Prednisone 10 mg: take 4 tabs a day x 3 days; then 3 tabs a day x 4 days; then 2 tabs a day x 4 days, then 1 tab a day x 6 days, then stop. Take pc.

## 2017-11-12 NOTE — Assessment & Plan Note (Signed)
Percocet   Potential benefits of a long term opioids use as well as potential risks (i.e. addiction risk, apnea etc) and complications (i.e. Somnolence, constipation and others) were explained to the patient and were aknowledged.  Potential benefits of a long term NSAID  use as well as potential risks  and complications were explained to the patient and were aknowledged.

## 2017-11-12 NOTE — Assessment & Plan Note (Signed)
On Levothroid 

## 2017-11-12 NOTE — Progress Notes (Signed)
Subjective:  Patient ID: Melody [name], female    DOB: 10-23-1956  Age: 62 y.o. MRN: [id number]  CC: No chief complaint on file.   HPI MKAYLA STEELE presents for a skin rash on chest and abdomen x 2 weeks after carrying wood into a house. Cat had fleas - eliminated. No scabies, bed bugs exposure...  C/o cough w/green mucus  F/u anxiety, LBP, hypothyroidism  Outpatient Medications Prior to Visit  Medication Sig Dispense Refill  . albuterol (PROVENTIL HFA;VENTOLIN HFA) 108 (90 Base) MCG/ACT inhaler Inhale 2 puffs every 6 (six) hours as needed into the lungs for wheezing or shortness of breath. 1 Inhaler 6  . ALPRAZolam (XANAX) 1 MG tablet Take 1 mg by mouth 4 (four) times daily.     . Azelastine-Fluticasone (DYMISTA) 137-50 MCG/ACT SUSP Place 1 spray 2 (two) times daily into the nose. 1 Bottle 3  . cholecalciferol (VITAMIN D) 1000 units tablet Take 1,000 Units by mouth once a week.    . cyanocobalamin (,VITAMIN B-12,) 1000 MCG/ML injection INJECT 1 ML INTO THE MUSCLE EVERY 14 DAYS. 2 mL 11  . fluticasone (FLONASE) 50 MCG/ACT nasal spray USE 2 SPRAYS IN BOTH NOSTRILS DAILY. 16 g 11  . fluticasone furoate-vilanterol (BREO ELLIPTA) 100-25 MCG/INH AEPB Inhale 1 puff into the lungs daily. 60 each 5  . gabapentin (NEURONTIN) 300 MG capsule Take 1 capsule (300 mg total) by mouth 2 (two) times daily. 60 capsule 0  . levocetirizine (XYZAL) 5 MG tablet TAKE 1 TABLET BY MOUTH EVERY EVENING. 30 tablet 11  . levothyroxine (SYNTHROID, LEVOTHROID) 50 MCG tablet Take 1 tablet (50 mcg total) by mouth daily before breakfast. 30 tablet 11  . methylphenidate (RITALIN) 20 MG tablet TAKE ONE TABLET BY MOUTH EVERY MORNING AND AT NOON  0  . oxyCODONE-acetaminophen (PERCOCET/ROXICET) 5-325 MG tablet Take 1 tablet by mouth 2 (two) times daily as needed for severe pain. 60 tablet 0  . pantoprazole (PROTONIX) 40 MG tablet Take 1 tablet (40 mg total) by mouth daily. 30 tablet 5  . QUEtiapine (SEROQUEL) 25 MG tablet  Take 25 mg by mouth at bedtime.     Marland Kitchen Respiratory Therapy Supplies (FLUTTER) DEVI Use as directed 1 each 0  . sertraline (ZOLOFT) 50 MG tablet Take 50 mg by mouth daily.   99  . triamcinolone ointment (KENALOG) 0.1 % Apply 1 application topically 2 (two) times daily. 80 g 3  . Vitamin D, Ergocalciferol, (DRISDOL) 50000 units CAPS capsule Take 1 capsule (50,000 Units total) by mouth every 7 (seven) days. 12 capsule 0   No facility-administered medications prior to visit.     ROS Review of Systems  Constitutional: Negative for activity change, appetite change, chills, fatigue and unexpected weight change.  HENT: Negative for congestion, mouth sores and sinus pressure.   Eyes: Negative for visual disturbance.  Respiratory: Negative for cough and chest tightness.   Gastrointestinal: Negative for abdominal pain and nausea.  Genitourinary: Negative for difficulty urinating, frequency and vaginal pain.  Musculoskeletal: Negative for back pain and gait problem.  Skin: Positive for rash. Negative for pallor.  Neurological: Negative for dizziness, tremors, weakness, numbness and headaches.  Psychiatric/Behavioral: Negative for confusion and sleep disturbance.    Objective:  BP 124/84   Pulse 95   Temp 98.1 F (36.7 C) (Oral)   Ht 5\' 6"  (1.676 m)   Wt 148 lb (67.1 kg)   LMP 10/29/2006 (Approximate)   SpO2 98%   BMI 23.89 kg/m   BP  Readings from Last 3 Encounters:  11/12/17 124/84  11/12/17 124/84  10/25/17 132/80    Wt Readings from Last 3 Encounters:  11/12/17 148 lb (67.1 kg)  11/12/17 148 lb (67.1 kg)  10/25/17 151 lb (68.5 kg)    Physical Exam  Constitutional: She appears well-developed. No distress.  HENT:  Head: Normocephalic.  Right Ear: External ear normal.  Left Ear: External ear normal.  Nose: Nose normal.  Mouth/Throat: Oropharynx is clear and moist.  Eyes: Conjunctivae are normal. Pupils are equal, round, and reactive to An. Right eye exhibits no discharge.  Left eye exhibits no discharge.  Neck: Normal range of motion. Neck supple. No JVD present. No tracheal deviation present. No thyromegaly present.  Cardiovascular: Normal rate, regular rhythm and normal heart sounds.  Pulmonary/Chest: No stridor. No respiratory distress. She has no wheezes.  Abdominal: Soft. Bowel sounds are normal. She exhibits no distension and no mass. There is no tenderness. There is no rebound and no guarding.  Musculoskeletal: She exhibits tenderness. She exhibits no edema.  Lymphadenopathy:    She has no cervical adenopathy.  Neurological: She displays normal reflexes. No cranial nerve deficit. She exhibits normal muscle tone. Coordination normal.  Skin: Rash noted. No erythema.  Psychiatric: She has a normal mood and affect. Her behavior is normal. Judgment and thought content normal.  1 mm papules on abd and chest - scattered Coarse BS B LS - tender  Lab Results  Component Value Date   WBC 6.3 07/03/2017   HGB 14.1 07/03/2017   HCT 42.7 07/03/2017   PLT 154 07/03/2017   GLUCOSE 91 07/03/2017   CHOL 186 09/03/2014   TRIG 64.0 09/03/2014   HDL 55.00 09/03/2014   LDLCALC 118 (H) 09/03/2014   ALT 11 (L) 07/03/2017   AST 14 (L) 07/03/2017   NA 138 07/03/2017   K 3.2 (L) 07/03/2017   CL 103 07/03/2017   CREATININE 0.87 07/03/2017   BUN 14 07/03/2017   CO2 28 07/03/2017   TSH 3.19 08/17/2016    Dg Lumbar Spine Complete  Result Date: 10/16/2017 CLINICAL DATA:  Acute lower back pain without known injury. EXAM: LUMBAR SPINE - COMPLETE 4+ VIEW COMPARISON:  Radiographs of December 19, 2012. FINDINGS: Grade 1 anterolisthesis of L4-5 is noted secondary to posterior facet joint hypertrophy. Mild degenerative disc disease is noted at L3-4 and L4-5 with anterior osteophyte formation. Mild degenerative changes seen involving posterior facet joints of L5-S1. IMPRESSION: Multilevel degenerative disc disease. No acute abnormality seen in the lumbar spine. Electronically  Signed   By: Marijo Conception, M.D.   On: 10/16/2017 17:04    Assessment & Plan:   There are no diagnoses linked to this encounter. I am having Melody [name] maintain her QUEtiapine, ALPRAZolam, cholecalciferol, triamcinolone ointment, levothyroxine, fluticasone, cyanocobalamin, sertraline, FLUTTER, pantoprazole, levocetirizine, fluticasone furoate-vilanterol, oxyCODONE-acetaminophen, Azelastine-Fluticasone, albuterol, methylphenidate, gabapentin, and Vitamin D (Ergocalciferol).  No orders of the defined types were placed in this encounter.    Follow-up: No Follow-up on file.  Walker Kehr, MD

## 2017-11-14 ENCOUNTER — Telehealth: Payer: Self-pay | Admitting: Internal Medicine

## 2017-11-14 NOTE — Telephone Encounter (Signed)
Copied from [place] [phone]. Topic: Quick Communication - See Telephone Encounter >> Nov 14, 2017  4:25 PM Cleaster Corin, [place] wrote: CRM for notification. See Telephone encounter for:   11/14/17. Pt. Calling to let someone know that she doesn't need another rx. Written for prednisone she found the rx. Pt. Can be reached at [phone]

## 2017-11-15 NOTE — Telephone Encounter (Signed)
noted 

## 2017-12-24 NOTE — Progress Notes (Signed)
[name] [address], [place] [address] Phone: [phone] Subjective:      CC: Back pain follow-up  ERX:[id number]  Melody [name] is a 62 y.o. female coming in with complaint of back pain.  Found to have pain more in the right gluteal area.  Did have x-rays from December were independently visualized by me showing facet arthropathy at L5-S1 patient was to have an MRI but never followed through.  Was given home exercises, icing regimen, we discussed different medications.  Getting pain medication from primary care provider.  Patient states no real back pain at this time.  Patient feels like she is doing well.  Lots of other stressors in her life.     Past Medical History:  Diagnosis Date  . Anxiety   . Bronchiectasis ([place])   . Chronic back pain   . Chronic neck pain   . Depression    bipolar- Dr [name]  . Fatigue   . Fibromyalgia   . GERD (gastroesophageal reflux disease)   . Headache   . Hypothyroidism   . Low back pain    Dr Arnoldo Morale  . 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([place])   . Osteoarthritis   . Pneumonia   . Pulmonary nodule   . Sinus congestion   . Thyroid disease    hypothyroidsm  . Vertigo   . Vitamin B12 deficiency    Past Surgical History:  Procedure Laterality Date  . ABDOMINAL HYSTERECTOMY N/A 01/10/2016   Procedure: HYSTERECTOMY ABDOMINAL;  Surgeon: Nunzio Cobbs, MD;  Location: [facility];  Service: Gynecology;  Laterality: N/A;  . ABDOMINAL SACROCOLPOPEXY N/A 01/10/2016   Procedure: ABDOMINO SACROCOLPOPEXY ;  Surgeon: Nunzio Cobbs, MD;  Location: [facility];  Service: Gynecology;  Laterality: N/A;  . ANTERIOR AND POSTERIOR REPAIR N/A 01/10/2016   Procedure:  POSTERIOR REPAIR (RECTOCELE);  Surgeon: Nunzio Cobbs, MD;  Location: [facility];  Service: Gynecology;  Laterality: N/A;  . BLADDER SUSPENSION N/A 01/10/2016   Procedure: TRANSVAGINAL TAPE (TVT) PROCEDURE exact midurethral sling;   Surgeon: Nunzio Cobbs, MD;  Location: [facility];  Service: Gynecology;  Laterality: N/A;  . CERVICAL LAMINECTOMY  2001 and 2995   Dr Arnoldo Morale  . CYSTO N/A 01/10/2016   Procedure: CYSTOSCOPY;  Surgeon: Nunzio Cobbs, MD;  Location: [facility];  Service: Gynecology;  Laterality: N/A;  . SALPINGOOPHORECTOMY Bilateral 01/10/2016   Procedure: BILATERAL SALPINGO OOPHORECTOMY;  Surgeon: Nunzio Cobbs, MD;  Location: [facility];  Service: Gynecology;  Laterality: Bilateral;  . TUBAL LIGATION  1980   Social History   Socioeconomic History  . Marital status: Legally Separated    Spouse name: None  . Number of children: None  . Years of education: None  . Highest education level: None  Social Needs  . Financial resource strain: None  . Food insecurity - worry: None  . Food insecurity - inability: None  . Transportation needs - medical: None  . Transportation needs - non-medical: None  Occupational History  . None  Tobacco Use  . Smoking status: Former Smoker    Packs/day: 1.00    Years: 35.00    Pack years: 35.00    Types: Cigarettes    Start date: 11/21/1962    Last attempt to quit: 05/06/2011    Years since quitting: 6.6  . Smokeless tobacco: Never Used  . Tobacco comment: stopped periodically - peak rate of  1 ppd  Substance and Sexual Activity  . Alcohol use: No    Alcohol/week: 0.0 oz  . Drug use: No  . Sexual activity: Yes    Birth control/protection: Post-menopausal, Surgical    Comment: Tubal/Hyst/BSO  Other Topics Concern  . None  Social History Narrative   [facility] (04/04/17):   Originally from [place], [place]. 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 Recently had a persian cat that passed. No bird exposure. She denies any indoor plants currently. She does use wood burning for heat. She reports there is water getting under her foundation. She has mold in her home that she reports is in the 2nd floor  of her home. She has recently started seeing mold under her bed. She reports her home smells musty & damp. She has always lived in [place].    Allergies  Allergen Reactions  . Doxycycline Hyclate Itching  . Gabapentin     HAs  . Nabumetone Other (See Comments)    Headache  . Sulfa Antibiotics Other (See Comments)    Reaction is unknown  . Tetracycline Hcl Itching  . Tramadol Hcl Other (See Comments)    Reaction is unknown   Family History  Problem Relation Age of Onset  . Heart attack Father        dec heart disease age 39  . Hypertension Father   . Lung disease Father   . Cancer Mother        dec stomach ca--age 21  . Breast cancer Cousin   . Arthritis Other   . Breast cancer Paternal Aunt      Past medical history, social, surgical and family history all reviewed in electronic medical record.  No pertanent information unless stated regarding to the chief complaint.   Review of Systems:Review of systems updated and as accurate as of 12/25/17  No headache, visual changes, nausea, vomiting, diarrhea, constipation, dizziness, abdominal pain, skin rash, fevers, chills, night sweats, weight loss, swollen lymph nodes, body aches, joint swelling, chest pain, shortness of breath, mood changes.  Positive muscle aches  Objective  Blood pressure 128/78, pulse 81, height 5\' 6"  (1.676 m), weight 157 lb (71.2 kg), last menstrual period 10/29/2006, SpO2 96 %. Systems examined below as of 12/25/17   General: No apparent distress alert and oriented x3 mood and affect normal, dressed appropriately.  HEENT: Pupils equal, extraocular movements intact  Respiratory: Patient's speak in full sentences and does not appear short of breath  Cardiovascular: No lower extremity edema, non tender, no erythema  Skin: Warm dry intact with no signs of infection or rash on extremities or on axial skeleton.  Abdomen: Soft nontender  Neuro: Cranial nerves II through XII are intact, neurovascularly intact in all  extremities with 2+ DTRs and 2+ pulses.  Lymph: No lymphadenopathy of posterior or anterior cervical chain or axillae bilaterally.  Gait normal with good balance and coordination.  MSK:  Non tender with full range of motion and good stability and symmetric strength and tone of shoulders, elbows, wrist, hip, knee and ankles bilaterally.  Mild arthritic changes Back exam shows diffuse tenderness to palpation.  Still full range of motion.  Some mild tightness with Faber bilaterally.  Neurovascular intact distally.    Impression and Recommendations:     This case required medical decision making of moderate complexity.      Note: This dictation was prepared with Dragon dictation along with smaller phrase  technology. Any transcriptional errors that result from this process are unintentional.

## 2017-12-25 ENCOUNTER — Telehealth: Payer: Self-pay | Admitting: Pulmonary Disease

## 2017-12-25 ENCOUNTER — Ambulatory Visit (INDEPENDENT_AMBULATORY_CARE_PROVIDER_SITE_OTHER): Payer: Medicare Other | Admitting: Infectious Diseases

## 2017-12-25 ENCOUNTER — Ambulatory Visit (INDEPENDENT_AMBULATORY_CARE_PROVIDER_SITE_OTHER): Payer: Medicare Other | Admitting: Family Medicine

## 2017-12-25 ENCOUNTER — Encounter: Payer: Self-pay | Admitting: Infectious Diseases

## 2017-12-25 ENCOUNTER — Encounter: Payer: Self-pay | Admitting: Family Medicine

## 2017-12-25 DIAGNOSIS — A310 Pulmonary mycobacterial infection: Secondary | ICD-10-CM

## 2017-12-25 DIAGNOSIS — R457 State of emotional shock and stress, unspecified: Secondary | ICD-10-CM | POA: Diagnosis not present

## 2017-12-25 DIAGNOSIS — R918 Other nonspecific abnormal finding of lung field: Secondary | ICD-10-CM

## 2017-12-25 DIAGNOSIS — M545 Low back pain, unspecified: Secondary | ICD-10-CM

## 2017-12-25 NOTE — Telephone Encounter (Signed)
Contacted by Dr. Johnnye Sima regarding follow up for possible MAI and pulmonary nodules  Patient was previously followed by Dr. Ashok Cordia.  Dr. Johnnye Sima was concerned she may need further evaluation with BAL for culture.    Will you please call the patient and let her know that she has a follow up visit with Dr. Lake Bells on 3/29 at 0900 for evaluation for pulmonary nodules.     Noe Gens, NP-C [facility] Pgr: [phone] or if no answer [phone] 12/25/2017, 4:03 PM

## 2017-12-25 NOTE — Assessment & Plan Note (Signed)
Stable at this time.  No significant changes.  Patient actually feels like she is doing a little bit better if anything.  Encouraged to continue the home exercises.  Follow-up as needed

## 2017-12-25 NOTE — Patient Instructions (Signed)
Good to see you  Gloves and extra socks when you go out of the house until 50 degrees.  Continue the exercises 2-3 times a week  Vitamin D 2000 IU daily  See me only if you need me

## 2017-12-25 NOTE — Assessment & Plan Note (Signed)
Will get her back in with pulm, consider BAL and repeat CT scan of chest.

## 2017-12-25 NOTE — Assessment & Plan Note (Addendum)
Will discuss with [facility] to see what resources are available in [name].  Melody [name] [phone])

## 2017-12-25 NOTE — Telephone Encounter (Signed)
-----   Message from Campbell Riches, MD sent at 12/25/2017  2:37 PM EST ----- Can you help this nice woman get a f/u visit for her lung nodules?  She may have MAI and she needs another Cx (maybe she needs BAL?).  She was seen by Creig Hines prior.   Thanks so much jeff
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
## 2017-12-25 NOTE — Assessment & Plan Note (Signed)
She is doing well, minimal sx and wt is stable.  Need more sputum to make dx as well as to get sensi.  Will see her back in 2 months.

## 2017-12-25 NOTE — Telephone Encounter (Signed)
Called pt letting her know of her appt.  Due to pt's drive, pt needed a later appt.  Changed pt's appt.  Stated to pt to call our office if she needed anything. Nothing further needed at this current time.

## 2017-12-27 NOTE — Telephone Encounter (Signed)
Thanks so much. 

## 2018-01-10 ENCOUNTER — Other Ambulatory Visit: Payer: Self-pay | Admitting: Family

## 2018-01-16 ENCOUNTER — Other Ambulatory Visit: Payer: Self-pay | Admitting: Internal Medicine

## 2018-01-24 ENCOUNTER — Institutional Professional Consult (permissible substitution): Payer: Medicare Other | Admitting: Pulmonary Disease

## 2018-01-28 ENCOUNTER — Other Ambulatory Visit: Payer: Self-pay | Admitting: Internal Medicine

## 2018-01-30 ENCOUNTER — Ambulatory Visit (INDEPENDENT_AMBULATORY_CARE_PROVIDER_SITE_OTHER): Payer: Medicare Other | Admitting: Pulmonary Disease

## 2018-01-30 ENCOUNTER — Encounter: Payer: Self-pay | Admitting: Obstetrics and Gynecology

## 2018-01-30 ENCOUNTER — Ambulatory Visit (INDEPENDENT_AMBULATORY_CARE_PROVIDER_SITE_OTHER): Payer: Medicare Other | Admitting: Obstetrics and Gynecology

## 2018-01-30 ENCOUNTER — Other Ambulatory Visit: Payer: Self-pay

## 2018-01-30 ENCOUNTER — Encounter: Payer: Self-pay | Admitting: Pulmonary Disease

## 2018-01-30 ENCOUNTER — Telehealth: Payer: Self-pay | Admitting: Pulmonary Disease

## 2018-01-30 VITALS — BP 110/72 | HR 88 | Resp 16 | Ht 66.5 in | Wt 154.0 lb

## 2018-01-30 VITALS — BP 132/76 | HR 70 | Ht 66.0 in | Wt 150.0 lb

## 2018-01-30 DIAGNOSIS — G4733 Obstructive sleep apnea (adult) (pediatric): Secondary | ICD-10-CM | POA: Diagnosis not present

## 2018-01-30 DIAGNOSIS — Z01419 Encounter for gynecological examination (general) (routine) without abnormal findings: Secondary | ICD-10-CM | POA: Diagnosis not present

## 2018-01-30 DIAGNOSIS — Z780 Asymptomatic menopausal state: Secondary | ICD-10-CM

## 2018-01-30 DIAGNOSIS — J849 Interstitial pulmonary disease, unspecified: Secondary | ICD-10-CM

## 2018-01-30 DIAGNOSIS — R05 Cough: Secondary | ICD-10-CM | POA: Diagnosis not present

## 2018-01-30 DIAGNOSIS — R059 Cough, unspecified: Secondary | ICD-10-CM

## 2018-01-30 DIAGNOSIS — R079 Chest pain, unspecified: Secondary | ICD-10-CM

## 2018-01-30 DIAGNOSIS — R32 Unspecified urinary incontinence: Secondary | ICD-10-CM

## 2018-01-30 MED ORDER — ALBUTEROL SULFATE (2.5 MG/3ML) 0.083% IN NEBU: 2.5000 mg | INHALATION_SOLUTION | Freq: Four times a day (QID) | RESPIRATORY_TRACT | 12 refills | Status: DC | PRN

## 2018-01-30 NOTE — Patient Instructions (Signed)
For chest tightness and dizziness: Were going to make arrangements for a stress test  For bronchiectasis and findings on your chest CT that are worrisome for MAC infection: Provide [place] with another sample of your mucus Use albuterol twice a day no matter how you feel with a nebulizer machine If your not able to produce mucus with the albuterol then we can give you a saltwater solution to use  For COPD: This term means chronic obstructive pulmonary disease and is consistent with your known diagnosis of prior tobacco use. Continue taking Breo daily no matter how you feel  We will see you back in 6-8 weeks to go over the results of the culture.

## 2018-01-30 NOTE — Telephone Encounter (Signed)
LVM for Alphonsus Sias to return my call regarding questions and concerns for order placed today for pt. X1

## 2018-01-30 NOTE — Progress Notes (Signed)
62 y.o. G1P2002 Legally Separated Caucasian female here for annual exam.    Having right sided pain when she wakes up in the morning, but it goes away.  No vaginal bleeding.  Having urinary leakage after she has voided.  Pink color to urine? Denies dysuria.   Sometimes can leak with a cough or laugh.  Gained about 15 pounds.  Does not do Kegel's.  Can be constipated or not. Having some fecal soiling. Had a lot of diarrhea as well. Not eating well per patient.   Will have a stress test due to cardiac arrhythmia.  Has lung nodules and planning a bronchoscopy.   Feels depressed.  Stays in bed most of the time.  Husband is now out of her life.  Son is at home and is causing stress.  Denies suicidal or homicidal ideation.  Seeing Dr. [name] as psychiatrist.  Labs with PCP.   PCP:  Walker Kehr.   Patient's last menstrual period was 10/29/2006 (approximate).           Sexually active: No.  The current method of family planning is tubal ligation and status post hysterectomy.    Exercising: No.  The patient does not participate in regular exercise at present. Smoker:  Former, quit 2013  Health Maintenance: Pap:  08-10-15 Neg:no HR HPV done  History of abnormal Pap:  no MMG:  01/30/17 BIRADS 4:Suspicious/density c; 02/12/17 Right breast biopsy -- see Epic for details.  Fibrocystic change with calcifications. Return to annual screening. Colonoscopy:  Thinks she had one with [facility] and had benign colon polyps:unsure of date or doctor. Refuses to have another one. She does yearly hemoccults with PCP and it was negative 09/2016. BMD:   n/a  Result  n/a TDaP:  08/01/15 Gardasil:   n/a HIV and Hep C: 01/24/17 Negative Screening Labs: PCP   reports that she quit smoking about 6 years ago. Her smoking use included cigarettes. She started smoking about 55 years ago. She has a 35.00 pack-year smoking history. She has never used smokeless tobacco. She reports that she does not drink alcohol  or use drugs.  Past Medical History:  Diagnosis Date  . Anxiety   . Bronchiectasis ([place])   . Chronic back pain   . Chronic neck pain   . Depression    bipolar- Dr [name]  . Fatigue   . Fibromyalgia   . GERD (gastroesophageal reflux disease)   . Headache   . Hypothyroidism   . Low back pain    Dr Arnoldo Morale  . 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([place])   . Osteoarthritis   . Pneumonia   . Pulmonary nodule   . Sinus congestion   . Thyroid disease    hypothyroidsm  . Vertigo   . Vitamin B12 deficiency     Past Surgical History:  Procedure Laterality Date  . ABDOMINAL HYSTERECTOMY N/A 01/10/2016   Procedure: HYSTERECTOMY ABDOMINAL;  Surgeon: Nunzio Cobbs, MD;  Location: [facility];  Service: Gynecology;  Laterality: N/A;  . ABDOMINAL SACROCOLPOPEXY N/A 01/10/2016   Procedure: ABDOMINO SACROCOLPOPEXY ;  Surgeon: Nunzio Cobbs, MD;  Location: [facility];  Service: Gynecology;  Laterality: N/A;  . ANTERIOR AND POSTERIOR REPAIR N/A 01/10/2016   Procedure:  POSTERIOR REPAIR (RECTOCELE);  Surgeon: Nunzio Cobbs, MD;  Location: [facility];  Service: Gynecology;  Laterality: N/A;  . BLADDER SUSPENSION N/A 01/10/2016   Procedure: TRANSVAGINAL TAPE (TVT) PROCEDURE exact midurethral sling;  Surgeon: Vicenta Dunning  Quincy Simmonds, MD;  Location: [facility];  Service: Gynecology;  Laterality: N/A;  . CERVICAL LAMINECTOMY  2001 and 2995   Dr Arnoldo Morale  . CYSTO N/A 01/10/2016   Procedure: CYSTOSCOPY;  Surgeon: Nunzio Cobbs, MD;  Location: [facility];  Service: Gynecology;  Laterality: N/A;  . SALPINGOOPHORECTOMY Bilateral 01/10/2016   Procedure: BILATERAL SALPINGO OOPHORECTOMY;  Surgeon: Nunzio Cobbs, MD;  Location: [facility];  Service: Gynecology;  Laterality: Bilateral;  . TUBAL LIGATION  1980    Current Outpatient Medications  Medication Sig Dispense Refill  . albuterol (PROVENTIL HFA;VENTOLIN HFA) 108 (90 Base) MCG/ACT inhaler Inhale 2 puffs every 6 (six) hours  as needed into the lungs for wheezing or shortness of breath. 1 Inhaler 6  . albuterol (PROVENTIL) (2.5 MG/3ML) 0.083% nebulizer solution Take 3 mLs (2.5 mg total) by nebulization every 6 (six) hours as needed for wheezing or shortness of breath. 75 mL 12  . ALPRAZolam (XANAX) 1 MG tablet Take 1 mg by mouth 4 (four) times daily.     . Azelastine-Fluticasone (DYMISTA) 137-50 MCG/ACT SUSP Place 1 spray 2 (two) times daily into the nose. 1 Bottle 3  . BREO ELLIPTA 100-25 MCG/INH AEPB inhale ONE PUFF into THE lungs DAILY 60 each 11  . cholecalciferol (VITAMIN D) 1000 units tablet Take 1,000 Units by mouth once a week.    . cyanocobalamin (,VITAMIN B-12,) 1000 MCG/ML injection INJECT 1 ML INTO THE MUSCLE EVERY 14 DAYS. 2 mL 5  . fluticasone (FLONASE) 50 MCG/ACT nasal spray USE 2 SPRAYS IN BOTH NOSTRILS DAILY. 16 g 11  . LamoTRIgine (LAMICTAL PO) Take 25 mg by mouth daily.    Marland Kitchen levocetirizine (XYZAL) 5 MG tablet TAKE 1 TABLET BY MOUTH EVERY EVENING. 30 tablet 11  . levothyroxine (SYNTHROID, LEVOTHROID) 50 MCG tablet Take 1 tablet (50 mcg total) by mouth daily before breakfast. 30 tablet 11  . methylphenidate (RITALIN) 20 MG tablet TAKE ONE TABLET BY MOUTH EVERY MORNING AND AT NOON  0  . oxyCODONE-acetaminophen (PERCOCET/ROXICET) 5-325 MG tablet Take 1 tablet by mouth 2 (two) times daily as needed for severe pain. 60 tablet 0  . pantoprazole (PROTONIX) 40 MG tablet Take 1 tablet (40 mg total) by mouth daily. 30 tablet 5  . QUEtiapine (SEROQUEL) 25 MG tablet Take 25 mg by mouth at bedtime.     Marland Kitchen Respiratory Therapy Supplies (FLUTTER) DEVI Use as directed 1 each 0  . triamcinolone ointment (KENALOG) 0.1 % Apply 1 application topically 2 (two) times daily. 80 g 3  . Vitamin D, Ergocalciferol, (DRISDOL) 50000 units CAPS capsule Take 1 capsule (50,000 Units total) by mouth every 7 (seven) days. (Patient not taking: Reported on 01/30/2018) 12 capsule 0   No current facility-administered medications for this  visit.     Family History  Problem Relation Age of Onset  . Heart attack Father        dec heart disease age 24  . Hypertension Father   . Lung disease Father   . Cancer Mother        dec stomach ca--age 28  . Breast cancer Cousin   . Arthritis Other   . Breast cancer Paternal Aunt     ROS:  Pertinent items are noted in HPI.  Otherwise, a comprehensive ROS was negative.  Exam:   BP 110/72 (BP Location: Right Arm, Patient Position: Sitting, Cuff Size: Normal)   Pulse 88   Resp 16   Ht 5' 6.5" (1.689 m) Comment:  patient measured with shoes  Wt 154 lb (69.9 kg)   LMP 10/29/2006 (Approximate)   BMI 24.48 kg/m     General appearance: alert, cooperative and appears stated age Head: Normocephalic, without obvious abnormality, atraumatic Neck: no adenopathy, supple, symmetrical, trachea midline and thyroid normal to inspection and palpation Lungs: clear to auscultation bilaterally Breasts: normal appearance, no masses or tenderness, No nipple retraction or dimpling, No nipple discharge or bleeding, No axillary or supraclavicular adenopathy Heart: regular rate and rhythm Abdomen: soft, non-tender; no masses, no organomegaly Extremities: extremities normal, atraumatic, no cyanosis or edema Skin: Skin color, texture, turgor normal. No rashes or lesions Lymph nodes: Cervical, supraclavicular, and axillary nodes normal. No abnormal inguinal nodes palpated Neurologic: Grossly normal  Pelvic: External genitalia:  no lesions              Urethra:  normal appearing urethra with no masses, tenderness or lesions              Bartholins and Skenes: normal                 Vagina: normal appearing vagina with normal color and discharge, no lesions              Cervix:  Absent.               Pap taken: No. Bimanual Exam:  Uterus:   absent              Adnexa: no mass, fullness, tenderness              Rectal exam: Yes.  .  Confirms.              Anus:  normal sphincter tone, no  lesions  Chaperone was present for exam.  Assessment:   Well woman visit with normal exam. Status post TAH with sacrocolpopexy, anterior and posterior colporrhaphy, TVT sling and cystoscopy. Weight gain.  Depression.  Urinary incontinence.  Fecal incontinence.  Plan: Will help patient schedule mammogram and bone density.  Recommended self breast awareness. Pap and HR HPV  not indicated.  Guidelines for Calcium, Vitamin D, regular exercise program including cardiovascular and weight bearing exercise. Will assist in an appointment with Marya Amsler.  Labs with PCP.  Urine dip:  Negative but concentrated. Kegel's reviewed.  Metamucil for fecal incontinence.  Start by using twice weekly. Follow up annually and prn.    After visit summary provided.

## 2018-01-30 NOTE — Patient Instructions (Signed)
EXERCISE AND DIET:  We recommended that you start or continue a regular exercise program for good health. Regular exercise means any activity that makes your heart beat faster and makes you sweat.  We recommend exercising at least 30 minutes per day at least 3 days a week, preferably 4 or 5.  We also recommend a diet low in fat and sugar.  Inactivity, poor dietary choices and obesity can cause diabetes, heart attack, stroke, and kidney damage, among others.    ALCOHOL AND SMOKING:  Women should limit their alcohol intake to no more than 7 drinks/beers/glasses of wine (combined, not each!) per week. Moderation of alcohol intake to this level decreases your risk of breast cancer and liver damage. And of course, no recreational drugs are part of a healthy lifestyle.  And absolutely no smoking or even second hand smoke. Most people know smoking can cause heart and lung diseases, but did you know it also contributes to weakening of your bones? Aging of your skin?  Yellowing of your teeth and nails?  CALCIUM AND VITAMIN D:  Adequate intake of calcium and Vitamin D are recommended.  The recommendations for exact amounts of these supplements seem to change often, but generally speaking 600 mg of calcium (either carbonate or citrate) and 800 units of Vitamin D per day seems prudent. Certain women may benefit from higher intake of Vitamin D.  If you are among these women, your doctor will have told you during your visit.    PAP SMEARS:  Pap smears, to check for cervical cancer or precancers,  have traditionally been done yearly, although recent scientific advances have shown that most women can have pap smears less often.  However, every woman still should have a physical exam from her gynecologist every year. It will include a breast check, inspection of the vulva and vagina to check for abnormal growths or skin changes, a visual exam of the cervix, and then an exam to evaluate the size and shape of the uterus and  ovaries.  And after 63 years of age, a rectal exam is indicated to check for rectal cancers. We will also provide age appropriate advice regarding health maintenance, like when you should have certain vaccines, screening for sexually transmitted diseases, bone density testing, colonoscopy, mammograms, etc.   MAMMOGRAMS:  All women over 40 years old should have a yearly mammogram. Many facilities now offer a "3D" mammogram, which may cost around $50 extra out of pocket. If possible,  we recommend you accept the option to have the 3D mammogram performed.  It both reduces the number of women who will be called back for extra views which then turn out to be normal, and it is better than the routine mammogram at detecting truly abnormal areas.    COLONOSCOPY:  Colonoscopy to screen for colon cancer is recommended for all women at age 50.  We know, you hate the idea of the prep.  We agree, BUT, having colon cancer and not knowing it is worse!!  Colon cancer so often starts as a polyp that can be seen and removed at colonscopy, which can quite literally save your life!  And if your first colonoscopy is normal and you have no family history of colon cancer, most women don't have to have it again for 10 years.  Once every ten years, you can do something that may end up saving your life, right?  We will be happy to help you get it scheduled when you are ready.    Be sure to check your insurance coverage so you understand how much it will cost.  It may be covered as a preventative service at no cost, but you should check your particular policy.      Kegel Exercises Kegel exercises help strengthen the muscles that support the rectum, vagina, small intestine, bladder, and uterus. Doing Kegel exercises can help:  Improve bladder and bowel control.  Improve sexual response.  Reduce problems and discomfort during pregnancy.  Kegel exercises involve squeezing your pelvic floor muscles, which are the same muscles you  squeeze when you try to stop the flow of urine. The exercises can be done while sitting, standing, or lying down, but it is best to vary your position. Phase 1 exercises 1. Squeeze your pelvic floor muscles tight. You should feel a tight lift in your rectal area. If you are a female, you should also feel a tightness in your vaginal area. Keep your stomach, buttocks, and legs relaxed. 2. Hold the muscles tight for up to 10 seconds. 3. Relax your muscles. Repeat this exercise 50 times a day or as many times as told by your health care provider. Continue to do this exercise for at least 4-6 weeks or for as long as told by your health care provider. This information is not intended to replace advice given to you by your health care provider. Make sure you discuss any questions you have with your health care provider. Document Released: 10/01/2012 Document Revised: 06/09/2016 Document Reviewed: 09/04/2015 [employer] Interactive Patient Education  2018 [employer]  

## 2018-01-30 NOTE — Progress Notes (Signed)
Subjective:   PATIENT ID: Melody [name] GENDER: female DOB: [birth year], MRN: [id number]  Synopsis: Former patient of Dr. Ashok Cordia with bronchiectasis, MAC colonization, aspergillus, GERD, allergic rhinitis.  She smoked 1 pack of cigarettes daily from early adulthood until age 62.  HPI  Chief Complaint  Patient presents with  . Consult    Per brandi/ SOB , non productive cough    This is a pleasant 62 year old female who used to smoke heavily who comes to my clinic today for evaluation of bronchiectasis.  She was seen by my partner a year ago and sputum cultures were sent which were positive for MAC.  She has been seen by infectious disease since then but she is not been back to our facility since.  She tells me that she has shortness of breath on a day-to-day basis as well as chest tightness and wheezing.  She does have a sensation of chest congestion but she is unable to cleared out.  For the last 2 months she has had some substernal chest pain and dizziness which occurs on exertion.  She says that this has been escalating and is concerning to her.  She is never had a cardiac evaluation.  Her father died of a heart attack.  She says her father also had emphysema.  She tells me that she smoked 1 pack of cigarettes daily for approximately 40 years.  She quit at age 42.  She is never had a severe case of pneumonia except for once a year ago.  She denies any childhood respiratory illnesses.  She tells me that her weight has been increasing.  She does not have night sweats or unexplained fevers or chills.   Past Medical History:  Diagnosis Date  . Anxiety   . Bronchiectasis ([place])   . Chronic back pain   . Chronic neck pain   . Depression    bipolar- Dr [name]  . Fatigue   . Fibromyalgia   . GERD (gastroesophageal reflux disease)   . Headache   . Hypothyroidism   . Low back pain    Dr Arnoldo Morale  . 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([place])   . Osteoarthritis   . Pneumonia     . Pulmonary nodule   . Sinus congestion   . Thyroid disease    hypothyroidsm  . Vertigo   . Vitamin B12 deficiency      Family History  Problem Relation Age of Onset  . Heart attack Father        dec heart disease age 39  . Hypertension Father   . Lung disease Father   . Cancer Mother        dec stomach ca--age 31  . Breast cancer Cousin   . Arthritis Other   . Breast cancer Paternal Aunt      Social History   Socioeconomic History  . Marital status: Legally Separated    Spouse name: Not on file  . Number of children: Not on file  . Years of education: Not on file  . Highest education level: Not on file  Occupational History  . Not on file  Social Needs  . Financial resource strain: Not on file  . Food insecurity:    Worry: Not on file    Inability: Not on file  . Transportation needs:    Medical: Not on file    Non-medical: Not on file  Tobacco Use  . Smoking status: Former Smoker    Packs/day: 1.00  Years: 35.00    Pack years: 35.00    Types: Cigarettes    Start date: 11/21/1962    Last attempt to quit: 05/06/2011    Years since quitting: 6.7  . Smokeless tobacco: Never Used  . Tobacco comment: stopped periodically - peak rate of 1 ppd  Substance and Sexual Activity  . Alcohol use: No    Alcohol/week: 0.0 oz  . Drug use: No  . Sexual activity: Yes    Birth control/protection: Post-menopausal, Surgical    Comment: Tubal/Hyst/BSO  Lifestyle  . Physical activity:    Days per week: Not on file    Minutes per session: Not on file  . Stress: Not on file  Relationships  . Social connections:    Talks on phone: Not on file    Gets together: Not on file    Attends religious service: Not on file    Active member of club or organization: Not on file    Attends meetings of clubs or organizations: Not on file    Relationship status: Not on file  . Intimate partner violence:    Fear of current or ex partner: Not on file    Emotionally abused: Not on file     Physically abused: Not on file    Forced sexual activity: Not on file  Other Topics Concern  . Not on file  Social History Narrative   [facility] (04/04/17):   Originally from [place], [place]. 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 Recently had a persian cat that passed. No bird exposure. She denies any indoor plants currently. She does use wood burning for heat. She reports there is water getting under her foundation. She has mold in her home that she reports is in the 2nd floor of her home. She has recently started seeing mold under her bed. She reports her home smells musty & damp. She has always lived in [place].      Allergies  Allergen Reactions  . Doxycycline Hyclate Itching  . Gabapentin     HAs  . Nabumetone Other (See Comments)    Headache  . Sulfa Antibiotics Other (See Comments)    Reaction is unknown  . Tetracycline Hcl Itching  . Tramadol Hcl Other (See Comments)    Reaction is unknown     Outpatient Medications Prior to Visit  Medication Sig Dispense Refill  . albuterol (PROVENTIL HFA;VENTOLIN HFA) 108 (90 Base) MCG/ACT inhaler Inhale 2 puffs every 6 (six) hours as needed into the lungs for wheezing or shortness of breath. 1 Inhaler 6  . ALPRAZolam (XANAX) 1 MG tablet Take 1 mg by mouth 4 (four) times daily.     . Azelastine-Fluticasone (DYMISTA) 137-50 MCG/ACT SUSP Place 1 spray 2 (two) times daily into the nose. 1 Bottle 3  . BREO ELLIPTA 100-25 MCG/INH AEPB inhale ONE PUFF into THE lungs DAILY 60 each 11  . cholecalciferol (VITAMIN D) 1000 units tablet Take 1,000 Units by mouth once a week.    . cyanocobalamin (,VITAMIN B-12,) 1000 MCG/ML injection INJECT 1 ML INTO THE MUSCLE EVERY 14 DAYS. 2 mL 5  . fluticasone (FLONASE) 50 MCG/ACT nasal spray USE 2 SPRAYS IN BOTH NOSTRILS DAILY. 16 g 11  . LamoTRIgine (LAMICTAL PO) Take 25 mg by mouth daily.    Marland Kitchen levocetirizine (XYZAL) 5 MG tablet TAKE 1 TABLET BY MOUTH EVERY  EVENING. 30 tablet 11  . levothyroxine (SYNTHROID, LEVOTHROID) 50  MCG tablet Take 1 tablet (50 mcg total) by mouth daily before breakfast. 30 tablet 11  . methylphenidate (RITALIN) 20 MG tablet TAKE ONE TABLET BY MOUTH EVERY MORNING AND AT NOON  0  . oxyCODONE-acetaminophen (PERCOCET/ROXICET) 5-325 MG tablet Take 1 tablet by mouth 2 (two) times daily as needed for severe pain. 60 tablet 0  . pantoprazole (PROTONIX) 40 MG tablet Take 1 tablet (40 mg total) by mouth daily. 30 tablet 5  . QUEtiapine (SEROQUEL) 25 MG tablet Take 25 mg by mouth at bedtime.     . triamcinolone ointment (KENALOG) 0.1 % Apply 1 application topically 2 (two) times daily. 80 g 3  . Vitamin D, Ergocalciferol, (DRISDOL) 50000 units CAPS capsule Take 1 capsule (50,000 Units total) by mouth every 7 (seven) days. 12 capsule 0  . Respiratory Therapy Supplies (FLUTTER) DEVI Use as directed (Patient not taking: Reported on 01/30/2018) 1 each 0  . cefdinir (OMNICEF) 300 MG capsule Take 1 capsule (300 mg total) by mouth 2 (two) times daily. 20 capsule 0  . predniSONE (DELTASONE) 10 MG tablet Prednisone 10 mg: take 4 tabs a day x 3 days; then 3 tabs a day x 4 days; then 2 tabs a day x 4 days, then 1 tab a day x 6 days, then stop. Take pc. 38 tablet 1  . sertraline (ZOLOFT) 50 MG tablet Take 50 mg by mouth daily.   99   No facility-administered medications prior to visit.     Review of Systems  Constitutional: Negative for diaphoresis and malaise/fatigue.  HENT: Negative for ear pain, nosebleeds and sinus pain.   Respiratory: Positive for cough, sputum production, shortness of breath and wheezing. Negative for stridor.   Cardiovascular: Negative for palpitations, claudication and leg swelling.  Skin: Negative for itching and rash.      Objective:  Physical Exam   Vitals:   01/30/18 1209  BP: 132/76  Pulse: 70  SpO2: 97%  Weight: 150 lb (68 kg)  Height: _0  (1.676 m)    RA  Gen: chronically ill appearing, no acute  distress HENT: NCAT, OP clear, neck supple without masses Eyes: PERRL, EOMi Lymph: no cervical lymphadenopathy PULM: CTA B CV: RRR, no mgr, no JVD GI: BS+, soft, nontender, no hsm Derm: no rash or skin breakdown MSK: normal bulk and tone Neuro: A&Ox4, CN II-XII intact, strength 5/5 in all 4 extremities Psyche: normal mood and affect   CBC    Component Value Date/Time   WBC 6.3 07/03/2017 2207   RBC 4.91 07/03/2017 2207   HGB 14.1 07/03/2017 2207   HGB 12.1 01/16/2016 1619   HCT 42.7 07/03/2017 2207   PLT 154 07/03/2017 2207   MCV 87.0 07/03/2017 2207   MCH 28.7 07/03/2017 2207   MCHC 33.0 07/03/2017 2207   RDW 13.4 07/03/2017 2207   LYMPHSABS 1.5 07/03/2017 2207   MONOABS 0.3 07/03/2017 2207   EOSABS 0.0 07/03/2017 2207   BASOSABS 0.0 07/03/2017 2207     PFT 05/21/17: FVC 3.10 L (89%) FEV1 2.14 L (79%) FEV1/FVC 0.69 FEF 25-75 1.36 L (57%) negative bronchodilator response TLC 5.34 L (99%) RV 108% ERV 72% DLCO corrected 98%  IMAGING CT CHEST W/O 07/22/17: Images independently reviewed showing scattered tree-in-bud abnormalities in the right middle lobe and right lower lobe, right middle lobe bronchiectasis right lower lobe bronchiectasis some tree-in-bud abnormalities in the left lung as well.   HRCT CHEST W/O 04/19/17:  No intralobular septal thickening, groundglass opacities, or clinical changes to  suggest interstitial lung disease. Mild to moderate degree of bronchiectasis worse on the right compared to the left and worse also in the lower lobes compared to the upper lobes. There is some associated tree-in-bud opacities again more prevalent on the right compared with the left particularly in the right middle and lower lobes. No pathologically enlarged mediastinal adenopathy. No pleural effusion. No pericardial effusion. Nodule seemed to increase in size since CT imaging in May.  ESOPHAGRAM/BARIUM SWALLOW 04/08/17:  Normal esophagram.  CT CHEST W/ CONTRAST 02/26/17 :  No  pleural effusion or thickening. No pathologic mediastinal adenopathy. No pericardial effusion. Patchy nodularity present most prevalently within posterior segment right upper lobe, lateral segment of right middle lobe, and right lower lobe.  MICROBIOLOGY Sputum Culture (08/18/17):  Normal Oral Flora / Aspergillus fumigatus / AFB MAC  LABS 04/04/17 IgG: 1066 IgA: 135 IgM: 153 IgE: 15 RAST panel: Negative ANA:  1:80 RF:  <14 Anti-CCP:  <16 SSA:  <1.0 SSB:  <1.0  08/17/16 ANA: Negative Rheumatoid factor:  <14       Assessment & Plan:   OSA (obstructive sleep apnea)  Interstitial pulmonary disease ([place])  Discussion: Melody [name] has bronchiectasis, findings worrisome for atypical lung infection on a CT scan of the chest and COPD.  She has an extensive smoking history.  I think Brio is a reasonable medicine to continue taking but she needs something to help mobilize secretions.  We will give her an albuterol nebulizer machine and give her instructions to use it twice a day.  If that is not helpful then we can start hypertonic saline.  We will sample her mucus again to see if we can prove that she has a MAC infection.  However, if she does not have evidence of MAC on sputum culture then we will need to perform a bronchoscopy.  She has noted increasing chest pain and dizziness.  She has a family history significant for coronary disease.  Given her advanced smoking history I think it is important for [place] to evaluate her heart prior to any sort of endoscopy.  Plan: For chest tightness and dizziness: Were going to make arrangements for a stress test  For bronchiectasis and findings on your chest CT that are worrisome for MAC infection: Provide [place] with another sample of your mucus Use albuterol twice a day no matter how you feel with a nebulizer machine If your not able to produce mucus with the albuterol then we can give you a saltwater solution to use  For COPD: This term means chronic  obstructive pulmonary disease and is consistent with your known diagnosis of prior tobacco use. Continue taking Breo daily no matter how you feel  We will see you back in 6-8 weeks to go over the results of the culture.  25 minutes spent with patient, 40 min visit   Current Outpatient Medications:  .  albuterol (PROVENTIL HFA;VENTOLIN HFA) 108 (90 Base) MCG/ACT inhaler, Inhale 2 puffs every 6 (six) hours as needed into the lungs for wheezing or shortness of breath., Disp: 1 Inhaler, Rfl: 6 .  ALPRAZolam (XANAX) 1 MG tablet, Take 1 mg by mouth 4 (four) times daily. , Disp: , Rfl:  .  Azelastine-Fluticasone (DYMISTA) 137-50 MCG/ACT SUSP, Place 1 spray 2 (two) times daily into the nose., Disp: 1 Bottle, Rfl: 3 .  BREO ELLIPTA 100-25 MCG/INH AEPB, inhale ONE PUFF into THE lungs DAILY, Disp: 60 each, Rfl: 11 .  cholecalciferol (VITAMIN D) 1000 units tablet, Take 1,000 Units by  mouth once a week., Disp: , Rfl:  .  cyanocobalamin (,VITAMIN B-12,) 1000 MCG/ML injection, INJECT 1 ML INTO THE MUSCLE EVERY 14 DAYS., Disp: 2 mL, Rfl: 5 .  fluticasone (FLONASE) 50 MCG/ACT nasal spray, USE 2 SPRAYS IN BOTH NOSTRILS DAILY., Disp: 16 g, Rfl: 11 .  LamoTRIgine (LAMICTAL PO), Take 25 mg by mouth daily., Disp: , Rfl:  .  levocetirizine (XYZAL) 5 MG tablet, TAKE 1 TABLET BY MOUTH EVERY EVENING., Disp: 30 tablet, Rfl: 11 .  levothyroxine (SYNTHROID, LEVOTHROID) 50 MCG tablet, Take 1 tablet (50 mcg total) by mouth daily before breakfast., Disp: 30 tablet, Rfl: 11 .  methylphenidate (RITALIN) 20 MG tablet, TAKE ONE TABLET BY MOUTH EVERY MORNING AND AT NOON, Disp: , Rfl: 0 .  oxyCODONE-acetaminophen (PERCOCET/ROXICET) 5-325 MG tablet, Take 1 tablet by mouth 2 (two) times daily as needed for severe pain., Disp: 60 tablet, Rfl: 0 .  pantoprazole (PROTONIX) 40 MG tablet, Take 1 tablet (40 mg total) by mouth daily., Disp: 30 tablet, Rfl: 5 .  QUEtiapine (SEROQUEL) 25 MG tablet, Take 25 mg by mouth at bedtime. , Disp: ,  Rfl:  .  triamcinolone ointment (KENALOG) 0.1 %, Apply 1 application topically 2 (two) times daily., Disp: 80 g, Rfl: 3 .  Vitamin D, Ergocalciferol, (DRISDOL) 50000 units CAPS capsule, Take 1 capsule (50,000 Units total) by mouth every 7 (seven) days., Disp: 12 capsule, Rfl: 0 .  Respiratory Therapy Supplies (FLUTTER) DEVI, Use as directed (Patient not taking: Reported on 01/30/2018), Disp: 1 each, Rfl: 0

## 2018-01-30 NOTE — Progress Notes (Signed)
Scheduled patient while in office to see [name] with [place] on 02/24/2018 at 3 pm at [place]. Melody [name]'s first appointment is in July. Appointment was offered earlier in [place] and patient declined. Appointment offered for 4/17 in [place] and patient declined. Patient declines assistance with scheduling screening mammogram and BMD stating "I have to get home. I cannot stay to get schedule." All information provided for patient to contact the [facility].

## 2018-01-31 ENCOUNTER — Encounter: Payer: Self-pay | Admitting: Obstetrics and Gynecology

## 2018-01-31 NOTE — Telephone Encounter (Signed)
Left VM for Alphonsus Sias today regarding order placed yesterday on patient. X2

## 2018-02-03 NOTE — Telephone Encounter (Signed)
Attempted to call patient, no answer, message left to call back. Will close encounter per triage protocol due to multiple unsuccessful attempts to reach patient.

## 2018-02-07 ENCOUNTER — Ambulatory Visit (HOSPITAL_COMMUNITY): Payer: Medicare Other

## 2018-02-07 ENCOUNTER — Encounter (HOSPITAL_COMMUNITY): Payer: Medicare Other

## 2018-02-08 ENCOUNTER — Other Ambulatory Visit: Payer: Self-pay | Admitting: Internal Medicine

## 2018-02-11 ENCOUNTER — Encounter: Payer: Self-pay | Admitting: Internal Medicine

## 2018-02-11 ENCOUNTER — Ambulatory Visit (INDEPENDENT_AMBULATORY_CARE_PROVIDER_SITE_OTHER): Payer: Medicare Other | Admitting: Internal Medicine

## 2018-02-11 DIAGNOSIS — E538 Deficiency of other specified B group vitamins: Secondary | ICD-10-CM | POA: Diagnosis not present

## 2018-02-11 DIAGNOSIS — M545 Low back pain, unspecified: Secondary | ICD-10-CM

## 2018-02-11 DIAGNOSIS — D508 Other iron deficiency anemias: Secondary | ICD-10-CM | POA: Diagnosis not present

## 2018-02-11 DIAGNOSIS — A310 Pulmonary mycobacterial infection: Secondary | ICD-10-CM

## 2018-02-11 DIAGNOSIS — F411 Generalized anxiety disorder: Secondary | ICD-10-CM | POA: Diagnosis not present

## 2018-02-11 DIAGNOSIS — J449 Chronic obstructive pulmonary disease, unspecified: Secondary | ICD-10-CM | POA: Diagnosis not present

## 2018-02-11 MED ORDER — OXYCODONE-ACETAMINOPHEN 5-325 MG PO TABS: 1.0000 | ORAL_TABLET | Freq: Two times a day (BID) | ORAL | 0 refills | Status: DC | PRN

## 2018-02-11 MED ORDER — LEVOTHYROXINE SODIUM 50 MCG PO TABS
ORAL_TABLET | ORAL | 11 refills | Status: DC
Start: 2018-02-11 — End: 2019-02-19

## 2018-02-11 MED ORDER — SUMATRIPTAN SUCCINATE 100 MG PO TABS
100.0000 mg | ORAL_TABLET | ORAL | 3 refills | Status: DC | PRN
Start: 2018-02-11 — End: 2018-10-02

## 2018-02-11 NOTE — Assessment & Plan Note (Signed)
On iron periodicaly

## 2018-02-11 NOTE — Assessment & Plan Note (Addendum)
F/u w/Dr [place] Bronchoscopy - pending

## 2018-02-11 NOTE — Assessment & Plan Note (Signed)
b12

## 2018-02-11 NOTE — Assessment & Plan Note (Signed)
Percocet  Potential benefits of a long term opioids use as well as potential risks (i.e. addiction risk, apnea etc) and complications (i.e. Somnolence, constipation and others) were explained to the patient and were aknowledged.  Potential benefits of a long term NSAID  use as well as potential risks  and complications were explained to the patient and were aknowledged.

## 2018-02-11 NOTE — Assessment & Plan Note (Signed)
Dr Nell Range prn Breo qd

## 2018-02-11 NOTE — Progress Notes (Signed)
Subjective:  Patient ID: Melody [name], female    DOB: 11-Nov-1955  Age: 62 y.o. MRN: [id number]  CC: No chief complaint on file.   HPI OASIS GOEHRING presents for chronic pain - worse - ran out of pain meds.  F/u anxiety, HAs, depression, pulmonary MAC f/u  Outpatient Medications Prior to Visit  Medication Sig Dispense Refill  . albuterol (PROVENTIL HFA;VENTOLIN HFA) 108 (90 Base) MCG/ACT inhaler Inhale 2 puffs every 6 (six) hours as needed into the lungs for wheezing or shortness of breath. 1 Inhaler 6  . albuterol (PROVENTIL) (2.5 MG/3ML) 0.083% nebulizer solution Take 3 mLs (2.5 mg total) by nebulization every 6 (six) hours as needed for wheezing or shortness of breath. 75 mL 12  . ALPRAZolam (XANAX) 1 MG tablet Take 1 mg by mouth 4 (four) times daily.     . Azelastine-Fluticasone (DYMISTA) 137-50 MCG/ACT SUSP Place 1 spray 2 (two) times daily into the nose. 1 Bottle 3  . BREO ELLIPTA 100-25 MCG/INH AEPB inhale ONE PUFF into THE lungs DAILY 60 each 11  . cholecalciferol (VITAMIN D) 1000 units tablet Take 1,000 Units by mouth once a week.    . cyanocobalamin (,VITAMIN B-12,) 1000 MCG/ML injection INJECT 1 ML INTO THE MUSCLE EVERY 14 DAYS. 2 mL 5  . fluticasone (FLONASE) 50 MCG/ACT nasal spray USE 2 SPRAYS IN BOTH NOSTRILS DAILY. 16 g 11  . LamoTRIgine (LAMICTAL PO) Take 25 mg by mouth daily.    Marland Kitchen levocetirizine (XYZAL) 5 MG tablet TAKE 1 TABLET BY MOUTH EVERY EVENING. 30 tablet 11  . levothyroxine (SYNTHROID, LEVOTHROID) 50 MCG tablet TAKE ONE TABLET BY MOUTH DAILY BEFORE breakfast 30 tablet 9  . methylphenidate (RITALIN) 20 MG tablet TAKE ONE TABLET BY MOUTH EVERY MORNING AND AT NOON  0  . oxyCODONE-acetaminophen (PERCOCET/ROXICET) 5-325 MG tablet Take 1 tablet by mouth 2 (two) times daily as needed for severe pain. 60 tablet 0  . pantoprazole (PROTONIX) 40 MG tablet Take 1 tablet (40 mg total) by mouth daily. 30 tablet 5  . QUEtiapine (SEROQUEL) 25 MG tablet Take 25 mg by mouth at  bedtime.     Marland Kitchen Respiratory Therapy Supplies (FLUTTER) DEVI Use as directed 1 each 0  . triamcinolone ointment (KENALOG) 0.1 % Apply 1 application topically 2 (two) times daily. 80 g 3  . Vitamin D, Ergocalciferol, (DRISDOL) 50000 units CAPS capsule Take 1 capsule (50,000 Units total) by mouth every 7 (seven) days. 12 capsule 0   No facility-administered medications prior to visit.     ROS Review of Systems  Constitutional: Positive for fatigue. Negative for activity change, appetite change, chills and unexpected weight change.  HENT: Negative for congestion, mouth sores and sinus pressure.   Eyes: Negative for visual disturbance.  Respiratory: Negative for cough and chest tightness.   Gastrointestinal: Negative for abdominal pain and nausea.  Genitourinary: Negative for difficulty urinating, frequency and vaginal pain.  Musculoskeletal: Positive for arthralgias, back pain, neck pain and neck stiffness. Negative for gait problem.  Skin: Negative for pallor and rash.  Neurological: Negative for dizziness, tremors, weakness, numbness and headaches.  Psychiatric/Behavioral: Positive for decreased concentration, dysphoric mood and suicidal ideas. Negative for confusion and sleep disturbance. The patient is nervous/anxious.     Objective:  BP 126/78 (BP Location: Right Arm, Patient Position: Sitting, Cuff Size: Normal)   Pulse 72   Temp 98.3 F (36.8 C) (Oral)   Ht 5' 6.5" (1.689 m)   Wt 153 lb (69.4 kg)  LMP 10/29/2006 (Approximate)   SpO2 99%   BMI 24.32 kg/m   BP Readings from Last 3 Encounters:  02/11/18 126/78  01/30/18 110/72  01/30/18 132/76    Wt Readings from Last 3 Encounters:  02/11/18 153 lb (69.4 kg)  01/30/18 154 lb (69.9 kg)  01/30/18 150 lb (68 kg)    Physical Exam  Constitutional: She appears well-developed. No distress.  HENT:  Head: Normocephalic.  Right Ear: External ear normal.  Left Ear: External ear normal.  Nose: Nose normal.  Mouth/Throat:  Oropharynx is clear and moist.  Eyes: Pupils are equal, round, and reactive to [place]. Conjunctivae are normal. Right eye exhibits no discharge. Left eye exhibits no discharge.  Neck: Normal range of motion. Neck supple. No JVD present. No tracheal deviation present. No thyromegaly present.  Cardiovascular: Normal rate, regular rhythm and normal heart sounds.  Pulmonary/Chest: No stridor. No respiratory distress. She has no wheezes.  Abdominal: Soft. Bowel sounds are normal. She exhibits no distension and no mass. There is no tenderness. There is no rebound and no guarding.  Musculoskeletal: She exhibits tenderness. She exhibits no edema.  Lymphadenopathy:    She has no cervical adenopathy.  Neurological: She displays normal reflexes. No cranial nerve deficit. She exhibits normal muscle tone. Coordination normal.  Skin: No rash noted. No erythema.  Psychiatric: Her behavior is normal. Judgment and thought content normal.   Sad LS tender, shoulders - painful  Lab Results  Component Value Date   WBC 6.3 07/03/2017   HGB 14.1 07/03/2017   HCT 42.7 07/03/2017   PLT 154 07/03/2017   GLUCOSE 91 07/03/2017   CHOL 186 09/03/2014   TRIG 64.0 09/03/2014   HDL 55.00 09/03/2014   LDLCALC 118 (H) 09/03/2014   ALT 11 (L) 07/03/2017   AST 14 (L) 07/03/2017   NA 138 07/03/2017   K 3.2 (L) 07/03/2017   CL 103 07/03/2017   CREATININE 0.87 07/03/2017   BUN 14 07/03/2017   CO2 28 07/03/2017   TSH 3.19 08/17/2016    Dg Lumbar Spine Complete  Result Date: 10/16/2017 CLINICAL DATA:  Acute lower back pain without known injury. EXAM: LUMBAR SPINE - COMPLETE 4+ VIEW COMPARISON:  Radiographs of December 19, 2012. FINDINGS: Grade 1 anterolisthesis of L4-5 is noted secondary to posterior facet joint hypertrophy. Mild degenerative disc disease is noted at L3-4 and L4-5 with anterior osteophyte formation. Mild degenerative changes seen involving posterior facet joints of L5-S1. IMPRESSION: Multilevel  degenerative disc disease. No acute abnormality seen in the lumbar spine. Electronically Signed   By: Marijo Conception, M.D.   On: 10/16/2017 17:04    Assessment & Plan:   There are no diagnoses linked to this encounter. I am having Melody [name] maintain her QUEtiapine, ALPRAZolam, cholecalciferol, triamcinolone ointment, fluticasone, FLUTTER, pantoprazole, levocetirizine, Azelastine-Fluticasone, albuterol, methylphenidate, Vitamin D (Ergocalciferol), oxyCODONE-acetaminophen, LamoTRIgine (LAMICTAL PO), BREO ELLIPTA, cyanocobalamin, albuterol, and levothyroxine.  No orders of the defined types were placed in this encounter.    Follow-up: No follow-ups on file.  Walker Kehr, MD

## 2018-02-11 NOTE — Assessment & Plan Note (Signed)
Dr [name] Seroquel, Xanax prn

## 2018-02-12 ENCOUNTER — Telehealth: Payer: Self-pay | Admitting: Pulmonary Disease

## 2018-02-12 NOTE — Telephone Encounter (Signed)
Called and spoke with patient she states that she is needing to know what medications she can take and which medications she should hold before her stress test tomorrow. Patient called the office at [facility] and they are closed for the day  BQ please advise.   Current Outpatient Medications on File Prior to Visit  Medication Sig Dispense Refill  . albuterol (PROVENTIL HFA;VENTOLIN HFA) 108 (90 Base) MCG/ACT inhaler Inhale 2 puffs every 6 (six) hours as needed into the lungs for wheezing or shortness of breath. 1 Inhaler 6  . albuterol (PROVENTIL) (2.5 MG/3ML) 0.083% nebulizer solution Take 3 mLs (2.5 mg total) by nebulization every 6 (six) hours as needed for wheezing or shortness of breath. 75 mL 12  . ALPRAZolam (XANAX) 1 MG tablet Take 1 mg by mouth 4 (four) times daily.     . Azelastine-Fluticasone (DYMISTA) 137-50 MCG/ACT SUSP Place 1 spray 2 (two) times daily into the nose. 1 Bottle 3  . BREO ELLIPTA 100-25 MCG/INH AEPB inhale ONE PUFF into THE lungs DAILY 60 each 11  . cholecalciferol (VITAMIN D) 1000 units tablet Take 1,000 Units by mouth once a week.    . cyanocobalamin (,VITAMIN B-12,) 1000 MCG/ML injection INJECT 1 ML INTO THE MUSCLE EVERY 14 DAYS. 2 mL 5  . fluticasone (FLONASE) 50 MCG/ACT nasal spray USE 2 SPRAYS IN BOTH NOSTRILS DAILY. 16 g 11  . LamoTRIgine (LAMICTAL PO) Take 25 mg by mouth daily.    Marland Kitchen levocetirizine (XYZAL) 5 MG tablet TAKE 1 TABLET BY MOUTH EVERY EVENING. 30 tablet 11  . levothyroxine (SYNTHROID, LEVOTHROID) 50 MCG tablet TAKE ONE TABLET BY MOUTH DAILY BEFORE breakfast 30 tablet 11  . methylphenidate (RITALIN) 20 MG tablet TAKE ONE TABLET BY MOUTH EVERY MORNING AND AT NOON  0  . oxyCODONE-acetaminophen (PERCOCET/ROXICET) 5-325 MG tablet Take 1 tablet by mouth 2 (two) times daily as needed for severe pain. 60 tablet 0  . pantoprazole (PROTONIX) 40 MG tablet Take 1 tablet (40 mg total) by mouth daily. 30 tablet 5  . QUEtiapine (SEROQUEL) 25 MG tablet Take 25 mg  by mouth at bedtime.     Marland Kitchen Respiratory Therapy Supplies (FLUTTER) DEVI Use as directed 1 each 0  . SUMAtriptan (IMITREX) 100 MG tablet Take 1 tablet (100 mg total) by mouth every 2 (two) hours as needed for migraine. May repeat in 2 hours if headache persists or recurs. 10 tablet 3  . triamcinolone ointment (KENALOG) 0.1 % Apply 1 application topically 2 (two) times daily. 80 g 3  . Vitamin D, Ergocalciferol, (DRISDOL) 50000 units CAPS capsule Take 1 capsule (50,000 Units total) by mouth every 7 (seven) days. 12 capsule 0   No current facility-administered medications on file prior to visit.

## 2018-02-13 ENCOUNTER — Ambulatory Visit (HOSPITAL_COMMUNITY)
Admission: RE | Admit: 2018-02-13 | Discharge: 2018-02-13 | Disposition: A | Discharge status: Home / Self Care | Payer: Medicare Other | Source: Ambulatory Visit | Attending: Pulmonary Disease | Admitting: Pulmonary Disease

## 2018-02-13 ENCOUNTER — Encounter (HOSPITAL_COMMUNITY)
Admission: RE | Admit: 2018-02-13 | Discharge: 2018-02-13 | Disposition: A | Discharge status: Home / Self Care | Payer: Medicare Other | Source: Ambulatory Visit | Attending: Pulmonary Disease | Admitting: Pulmonary Disease

## 2018-02-13 ENCOUNTER — Encounter (HOSPITAL_COMMUNITY): Payer: Self-pay

## 2018-02-13 DIAGNOSIS — R079 Chest pain, unspecified: Secondary | ICD-10-CM | POA: Diagnosis not present

## 2018-02-13 DIAGNOSIS — R9439 Abnormal result of other cardiovascular function study: Secondary | ICD-10-CM | POA: Insufficient documentation

## 2018-02-13 LAB — NM MYOCAR MULTI W/SPECT W/WALL MOTION / EF
CHL CUP NUCLEAR SDS: 3
CHL CUP RESTING HR STRESS: 64 {beats}/min
CSEPPHR: 100 {beats}/min
LV dias vol: 60 mL (ref 46–106)
LVSYSVOL: 23 mL
RATE: 0.35
SRS: 1
SSS: 4
TID: 0.89

## 2018-02-13 MED ORDER — SODIUM CHLORIDE 0.9% FLUSH
INTRAVENOUS | Status: AC
  Administered 2018-02-13: 10 mL via INTRAVENOUS
  Filled 2018-02-13: qty 10

## 2018-02-13 MED ORDER — TECHNETIUM TC 99M TETROFOSMIN IV KIT
30.0000 | PACK | Freq: Once | INTRAVENOUS | Status: AC | PRN
  Administered 2018-02-13: 31 via INTRAVENOUS

## 2018-02-13 MED ORDER — REGADENOSON 0.4 MG/5ML IV SOLN
INTRAVENOUS | Status: AC
  Administered 2018-02-13: 0.4 mg via INTRAVENOUS
  Filled 2018-02-13: qty 5

## 2018-02-13 MED ORDER — TECHNETIUM TC 99M TETROFOSMIN IV KIT
10.0000 | PACK | Freq: Once | INTRAVENOUS | Status: AC | PRN
  Administered 2018-02-13: 10 via INTRAVENOUS

## 2018-02-13 NOTE — Telephone Encounter (Signed)
Called pt and advised message from the provider. Pt understood and verbalized understanding. Nothing further is needed.    

## 2018-02-13 NOTE — Telephone Encounter (Signed)
Not sure

## 2018-02-14 ENCOUNTER — Other Ambulatory Visit: Payer: Self-pay | Admitting: Obstetrics and Gynecology

## 2018-02-14 DIAGNOSIS — Z139 Encounter for screening, unspecified: Secondary | ICD-10-CM

## 2018-02-24 ENCOUNTER — Ambulatory Visit (INDEPENDENT_AMBULATORY_CARE_PROVIDER_SITE_OTHER): Payer: Medicare Other | Admitting: Psychology

## 2018-02-24 DIAGNOSIS — F331 Major depressive disorder, recurrent, moderate: Secondary | ICD-10-CM

## 2018-02-28 ENCOUNTER — Other Ambulatory Visit: Payer: Self-pay | Admitting: Internal Medicine

## 2018-03-12 ENCOUNTER — Ambulatory Visit (INDEPENDENT_AMBULATORY_CARE_PROVIDER_SITE_OTHER): Payer: Medicare Other | Admitting: Pulmonary Disease

## 2018-03-12 ENCOUNTER — Encounter: Payer: Self-pay | Admitting: Pulmonary Disease

## 2018-03-12 VITALS — BP 134/80 | HR 82 | Ht 66.0 in | Wt 148.6 lb

## 2018-03-12 DIAGNOSIS — J479 Bronchiectasis, uncomplicated: Secondary | ICD-10-CM | POA: Diagnosis not present

## 2018-03-12 DIAGNOSIS — J411 Mucopurulent chronic bronchitis: Secondary | ICD-10-CM | POA: Diagnosis not present

## 2018-03-12 DIAGNOSIS — A310 Pulmonary mycobacterial infection: Secondary | ICD-10-CM

## 2018-03-12 NOTE — Progress Notes (Signed)
Subjective:   PATIENT ID: Melody [name] GENDER: female DOB: 1956/10/14, MRN: [id number]  Synopsis: Former patient of Dr. Ashok Cordia with bronchiectasis, MAC colonization, aspergillus, GERD, allergic rhinitis.  She smoked 1 pack of cigarettes daily from early adulthood until age 62.  HPI  Chief Complaint  Patient presents with  . Follow-up    6-8 wk ROV   Ikram says that she was unable to produce a sample of mucus for [place].  She says that she doesn't cough much now.  She produces very little mucus.  This has improved overall.  She thinks that she sounds "raspy" but she is up moving around more.  She gained weight after the last visit, but now she is exercising more a losing a little weight.  No night sweats, no fever or chills.   Past Medical History:  Diagnosis Date  . Anxiety   . Bronchiectasis ([place])   . Chronic back pain   . Chronic neck pain   . Depression    bipolar- Dr [name]  . Fatigue   . Fibromyalgia   . GERD (gastroesophageal reflux disease)   . Headache   . Hypothyroidism   . Low back pain    Dr Arnoldo Morale  . 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([place])   . Osteoarthritis   . Pneumonia   . Pulmonary nodule   . Sinus congestion   . Thyroid disease    hypothyroidsm  . Vertigo   . Vitamin B12 deficiency        Review of Systems  Constitutional: Negative for diaphoresis and malaise/fatigue.  HENT: Negative for ear pain, nosebleeds and sinus pain.   Respiratory: Positive for cough, sputum production, shortness of breath and wheezing. Negative for stridor.   Cardiovascular: Negative for palpitations, claudication and leg swelling.  Skin: Negative for itching and rash.      Objective:  Physical Exam   Vitals:   03/12/18 1541  BP: 134/80  Pulse: 82  SpO2: 100%  Weight: 148 lb 9.6 oz (67.4 kg)  Height: 5' 6"  (1.676 m)    RA  Gen: well appearing HENT: OP clear, TM's clear, neck supple PULM: CTA B, normal percussion CV: RRR, no mgr, trace edema GI:  BS+, soft, nontender Derm: no cyanosis or rash Psyche: normal mood and affect    CBC    Component Value Date/Time   WBC 6.3 07/03/2017 2207   RBC 4.91 07/03/2017 2207   HGB 14.1 07/03/2017 2207   HGB 12.1 01/16/2016 1619   HCT 42.7 07/03/2017 2207   PLT 154 07/03/2017 2207   MCV 87.0 07/03/2017 2207   MCH 28.7 07/03/2017 2207   MCHC 33.0 07/03/2017 2207   RDW 13.4 07/03/2017 2207   LYMPHSABS 1.5 07/03/2017 2207   MONOABS 0.3 07/03/2017 2207   EOSABS 0.0 07/03/2017 2207   BASOSABS 0.0 07/03/2017 2207     PFT 05/21/17: FVC 3.10 L (89%) FEV1 2.14 L (79%) FEV1/FVC 0.69 FEF 25-75 1.36 L (57%) negative bronchodilator response TLC 5.34 L (99%) RV 108% ERV 72% DLCO corrected 98%  IMAGING CT CHEST W/O 07/22/17: Images independently reviewed showing scattered tree-in-bud abnormalities in the right middle lobe and right lower lobe, right middle lobe bronchiectasis right lower lobe bronchiectasis some tree-in-bud abnormalities in the left lung as well.   HRCT CHEST W/O 04/19/17:  No intralobular septal thickening, groundglass opacities, or clinical changes to suggest interstitial lung disease. Mild to moderate degree of bronchiectasis worse on the right compared to the left and worse also  in the lower lobes compared to the upper lobes. There is some associated tree-in-bud opacities again more prevalent on the right compared with the left particularly in the right middle and lower lobes. No pathologically enlarged mediastinal adenopathy. No pleural effusion. No pericardial effusion. Nodule seemed to increase in size since CT imaging in May.  ESOPHAGRAM/BARIUM SWALLOW 04/08/17:  Normal esophagram.  CT CHEST W/ CONTRAST 02/26/17 :  No pleural effusion or thickening. No pathologic mediastinal adenopathy. No pericardial effusion. Patchy nodularity present most prevalently within posterior segment right upper lobe, lateral segment of right middle lobe, and right lower lobe.  MICROBIOLOGY Sputum  Culture (08/18/17):  Normal Oral Flora / Aspergillus fumigatus / AFB MAC  LABS 04/04/17 IgG: 1066 IgA: 135 IgM: 153 IgE: 15 RAST panel: Negative ANA:  1:80 RF:  <14 Anti-CCP:  <16 SSA:  <1.0 SSB:  <1.0  08/17/16 ANA: Negative Rheumatoid factor:  <14       Assessment & Plan:   Bronchiectasis without complication (HCC)  Mucopurulent chronic bronchitis (HCC)  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([place])  Discussion: Azoria is doing better.  She says that her breathing has improved, her energy level has improved, and she is no longer coughing up mucus.  In fact she was not able to produce mucus for [place] to test a second time.  Given her lack of symptoms I am not enthusiastic about treating her for MAI right now.  We did discuss her diagnosis once again today (bronchiectasis) and COPD.  We talked about the importance of her treatment.  COPD: Keep taking Breo 1 puff daily no matter how you feel Use albuterol as needed for chest tightness wheezing or shortness of breath  Bronchiectasis: This term means that your airways are dilated and susceptible to infection Use a flutter valve at least twice a day, 10 breaths per episode Call us if you have worsening chest congestion, mucus production or fevers or chills  Colonization with Mycobacterium avium complex: As we discussed today, sputum cultures from the fall 2018 showed that you grew an organism called MAC.  This is because you have bronchiectasis.  You are not infected right now though because your symptoms have improved.  You are colonized and we will continue to watch this.  If you have worsening symptoms as described above please let us know because then we would need to consider treating you.  Follow-up 6 months or sooner if needed   Current Outpatient Medications:  .  albuterol (PROVENTIL HFA;VENTOLIN HFA) 108 (90 Base) MCG/ACT inhaler, Inhale 2 puffs every 6 (six) hours as needed into the lungs for wheezing or shortness  of breath., Disp: 1 Inhaler, Rfl: 6 .  albuterol (PROVENTIL) (2.5 MG/3ML) 0.083% nebulizer solution, Take 3 mLs (2.5 mg total) by nebulization every 6 (six) hours as needed for wheezing or shortness of breath., Disp: 75 mL, Rfl: 12 .  ALPRAZolam (XANAX) 1 MG tablet, Take 1 mg by mouth 4 (four) times daily. , Disp: , Rfl:  .  Azelastine-Fluticasone (DYMISTA) 137-50 MCG/ACT SUSP, Place 1 spray 2 (two) times daily into the nose., Disp: 1 Bottle, Rfl: 3 .  BREO ELLIPTA 100-25 MCG/INH AEPB, inhale ONE PUFF into THE lungs DAILY, Disp: 60 each, Rfl: 11 .  cholecalciferol (VITAMIN D) 1000 units tablet, Take 1,000 Units by mouth once a week., Disp: , Rfl:  .  cyanocobalamin (,VITAMIN B-12,) 1000 MCG/ML injection, INJECT 1 ML INTO THE MUSCLE EVERY 14 DAYS., Disp: 2 mL, Rfl: 5 .  fluticasone (FLONASE) 50 MCG/ACT nasal  spray, USE 2 SPRAYS IN BOTH NOSTRILS DAILY., Disp: 16 g, Rfl: 11 .  levocetirizine (XYZAL) 5 MG tablet, TAKE 1 TABLET BY MOUTH EVERY EVENING., Disp: 30 tablet, Rfl: 11 .  levothyroxine (SYNTHROID, LEVOTHROID) 50 MCG tablet, TAKE ONE TABLET BY MOUTH DAILY BEFORE breakfast, Disp: 30 tablet, Rfl: 11 .  methylphenidate (RITALIN) 20 MG tablet, TAKE ONE TABLET BY MOUTH EVERY MORNING AND AT NOON, Disp: , Rfl: 0 .  oxyCODONE-acetaminophen (PERCOCET/ROXICET) 5-325 MG tablet, Take 1 tablet by mouth 2 (two) times daily as needed for severe pain., Disp: 60 tablet, Rfl: 0 .  pantoprazole (PROTONIX) 40 MG tablet, Take 1 tablet (40 mg total) by mouth daily., Disp: 30 tablet, Rfl: 5 .  QUEtiapine (SEROQUEL) 25 MG tablet, Take 25 mg by mouth at bedtime. , Disp: , Rfl:  .  Respiratory Therapy Supplies (FLUTTER) DEVI, Use as directed, Disp: 1 each, Rfl: 0 .  SUMAtriptan (IMITREX) 100 MG tablet, Take 1 tablet (100 mg total) by mouth every 2 (two) hours as needed for migraine. May repeat in 2 hours if headache persists or recurs., Disp: 10 tablet, Rfl: 3 .  triamcinolone ointment (KENALOG) 0.1 %, Apply 1 application  topically 2 (two) times daily., Disp: 80 g, Rfl: 3 .  Vitamin D, Ergocalciferol, (DRISDOL) 50000 units CAPS capsule, Take 1 capsule (50,000 Units total) by mouth every 7 (seven) days., Disp: 12 capsule, Rfl: 0

## 2018-03-12 NOTE — Patient Instructions (Signed)
COPD: Keep taking Breo 1 puff daily no matter how you feel Use albuterol as needed for chest tightness wheezing or shortness of breath  Bronchiectasis: This term means that your airways are dilated and susceptible to infection Use a flutter valve at least twice a day, 10 breaths per episode Call us if you have worsening chest congestion, mucus production or fevers or chills  Colonization with Mycobacterium avium complex: As we discussed today, sputum cultures from the fall 2018 showed that you grew an organism called MAC.  This is because you have bronchiectasis.  You are not infected right now though because your symptoms have improved.  You are colonized and we will continue to watch this.  If you have worsening symptoms as described above please let us know because then we would need to consider treating you.  Follow-up 6 months or sooner if needed

## 2018-04-03 ENCOUNTER — Telehealth: Payer: Self-pay | Admitting: Pulmonary Disease

## 2018-04-03 ENCOUNTER — Encounter: Payer: Self-pay | Admitting: Family

## 2018-04-03 ENCOUNTER — Ambulatory Visit (INDEPENDENT_AMBULATORY_CARE_PROVIDER_SITE_OTHER): Payer: Medicare Other | Admitting: Family

## 2018-04-03 ENCOUNTER — Other Ambulatory Visit (INDEPENDENT_AMBULATORY_CARE_PROVIDER_SITE_OTHER): Payer: Medicare Other

## 2018-04-03 ENCOUNTER — Ambulatory Visit (INDEPENDENT_AMBULATORY_CARE_PROVIDER_SITE_OTHER)
Admission: RE | Admit: 2018-04-03 | Discharge: 2018-04-03 | Disposition: A | Discharge status: Home / Self Care | Payer: Medicare Other | Source: Ambulatory Visit | Attending: Family | Admitting: Family

## 2018-04-03 VITALS — BP 116/80 | HR 70 | Temp 98.3°F | Ht 66.0 in | Wt 148.0 lb

## 2018-04-03 DIAGNOSIS — M19071 Primary osteoarthritis, right ankle and foot: Secondary | ICD-10-CM | POA: Diagnosis not present

## 2018-04-03 DIAGNOSIS — M79671 Pain in right foot: Secondary | ICD-10-CM

## 2018-04-03 LAB — CBC WITH DIFFERENTIAL/PLATELET
BASOS ABS: 0.1 10*3/uL (ref 0.0–0.1)
Basophils Relative: 0.9 % (ref 0.0–3.0)
EOS ABS: 0 10*3/uL (ref 0.0–0.7)
Eosinophils Relative: 0.8 % (ref 0.0–5.0)
HEMATOCRIT: 42.8 % (ref 36.0–46.0)
HEMOGLOBIN: 14.4 g/dL (ref 12.0–15.0)
LYMPHS PCT: 20.8 % (ref 12.0–46.0)
Lymphs Abs: 1.3 10*3/uL (ref 0.7–4.0)
MCHC: 33.6 g/dL (ref 30.0–36.0)
MCV: 84.2 fl (ref 78.0–100.0)
MONO ABS: 0.4 10*3/uL (ref 0.1–1.0)
Monocytes Relative: 6.2 % (ref 3.0–12.0)
Neutro Abs: 4.4 10*3/uL (ref 1.4–7.7)
Neutrophils Relative %: 71.3 % (ref 43.0–77.0)
PLATELETS: 171 10*3/uL (ref 150.0–400.0)
RBC: 5.08 Mil/uL (ref 3.87–5.11)
RDW: 14.2 % (ref 11.5–15.5)
WBC: 6.2 10*3/uL (ref 4.0–10.5)

## 2018-04-03 NOTE — Progress Notes (Signed)
Melody [name] is a 62 y.o. female with the following history as recorded in EpicCare:  Patient Active Problem List   Diagnosis Date Noted  . Emotional stress 12/25/2017  . 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([place]) 10/16/2017  . Bursitis 05/21/2017  . Knee pain, chronic 05/21/2017  . Cough 04/04/2017  . Multiple lung nodules on CT 04/04/2017  . Change in voice 04/04/2017  . Fatigue 02/08/2016  . Complete uterovaginal prolapse 01/10/2016  . Status post total abdominal hysterectomy and bilateral salpingo-oophorectomy 01/10/2016  . Urinary incontinence 11/09/2015  . Vaginal discomfort 08/10/2015  . Rectocele 08/10/2015  . Well adult exam 09/03/2014  . Chronic obstructive airway disease with asthma ([place]) 09/03/2014  . UTI (urinary tract infection) 06/09/2014  . Dysplastic toenail 09/02/2013  . Aphthous ulcer 06/11/2013  . Dark urine 03/18/2013  . Rash 06/08/2012  . Contact dermatitis 04/13/2011  . PRURITUS 12/01/2010  . EXPOSURE TO HAZARDOUS BODY FLUID, HX OF 05/29/2010  . HIP PAIN 12/05/2009  . RUQ PAIN 02/04/2009  . WRIST PAIN 05/24/2008  . CERVICAL STRAIN 05/24/2008  . WARTS, VIRAL, UNSPECIFIED 04/06/2008  . NEOPLASM, SKIN, UNCERTAIN BEHAVIOR 13/24/4010  . Vitamin D deficiency 11/13/2007  . Hypothyroidism 08/05/2007  . Bipolar I disorder ([place]) 08/05/2007  . GERD 08/05/2007  . Anxiety state 07/23/2007  . Lumbar pain 07/23/2007  . Myalgia and myositis 07/23/2007  . B12 deficiency 05/22/2007  . IRON DEFICIENCY 05/22/2007    Current Outpatient Medications  Medication Sig Dispense Refill  . albuterol (PROVENTIL HFA;VENTOLIN HFA) 108 (90 Base) MCG/ACT inhaler Inhale 2 puffs every 6 (six) hours as needed into the lungs for wheezing or shortness of breath. 1 Inhaler 6  . albuterol (PROVENTIL) (2.5 MG/3ML) 0.083% nebulizer solution Take 3 mLs (2.5 mg total) by nebulization every 6 (six) hours as needed for wheezing or shortness of breath. 75 mL 12  . ALPRAZolam (XANAX) 1 MG  tablet Take 1 mg by mouth 4 (four) times daily.     . Azelastine-Fluticasone (DYMISTA) 137-50 MCG/ACT SUSP Place 1 spray 2 (two) times daily into the nose. 1 Bottle 3  . BREO ELLIPTA 100-25 MCG/INH AEPB inhale ONE PUFF into THE lungs DAILY 60 each 11  . cholecalciferol (VITAMIN D) 1000 units tablet Take 1,000 Units by mouth once a week.    . cyanocobalamin (,VITAMIN B-12,) 1000 MCG/ML injection INJECT 1 ML INTO THE MUSCLE EVERY 14 DAYS. 2 mL 5  . fluticasone (FLONASE) 50 MCG/ACT nasal spray USE 2 SPRAYS IN BOTH NOSTRILS DAILY. 16 g 11  . lamoTRIgine (LAMICTAL) 25 MG tablet Take 25 mg by mouth daily.  1  . levocetirizine (XYZAL) 5 MG tablet TAKE 1 TABLET BY MOUTH EVERY EVENING. 30 tablet 11  . levothyroxine (SYNTHROID, LEVOTHROID) 50 MCG tablet TAKE ONE TABLET BY MOUTH DAILY BEFORE breakfast 30 tablet 11  . methylphenidate (RITALIN) 20 MG tablet TAKE ONE TABLET BY MOUTH EVERY MORNING AND AT NOON  0  . oxyCODONE-acetaminophen (PERCOCET/ROXICET) 5-325 MG tablet Take 1 tablet by mouth 2 (two) times daily as needed for severe pain. 60 tablet 0  . pantoprazole (PROTONIX) 40 MG tablet Take 1 tablet (40 mg total) by mouth daily. 30 tablet 5  . QUEtiapine (SEROQUEL) 25 MG tablet Take 25 mg by mouth at bedtime.     Marland Kitchen Respiratory Therapy Supplies (FLUTTER) DEVI Use as directed 1 each 0  . SUMAtriptan (IMITREX) 100 MG tablet Take 1 tablet (100 mg total) by mouth every 2 (two) hours as needed for migraine. May repeat in 2  hours if headache persists or recurs. 10 tablet 3  . triamcinolone ointment (KENALOG) 0.1 % Apply 1 application topically 2 (two) times daily. 80 g 3  . Vitamin D, Ergocalciferol, (DRISDOL) 50000 units CAPS capsule Take 1 capsule (50,000 Units total) by mouth every 7 (seven) days. 12 capsule 0   No current facility-administered medications for this visit.     Allergies: Doxycycline hyclate; Gabapentin; Nabumetone; Sulfa antibiotics; Tetracycline hcl; and Tramadol hcl  Past Medical History:   Diagnosis Date  . Anxiety   . Bronchiectasis ([place])   . Chronic back pain   . Chronic neck pain   . Depression    bipolar- Dr [name]  . Fatigue   . Fibromyalgia   . GERD (gastroesophageal reflux disease)   . Headache   . Hypothyroidism   . Low back pain    Dr Arnoldo Morale  . 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([place])   . Osteoarthritis   . Pneumonia   . Pulmonary nodule   . Sinus congestion   . Thyroid disease    hypothyroidsm  . Vertigo   . Vitamin B12 deficiency     Past Surgical History:  Procedure Laterality Date  . ABDOMINAL HYSTERECTOMY N/A 01/10/2016   Procedure: HYSTERECTOMY ABDOMINAL;  Surgeon: Nunzio Cobbs, MD;  Location: [facility];  Service: Gynecology;  Laterality: N/A;  . ABDOMINAL SACROCOLPOPEXY N/A 01/10/2016   Procedure: ABDOMINO SACROCOLPOPEXY ;  Surgeon: Nunzio Cobbs, MD;  Location: [facility];  Service: Gynecology;  Laterality: N/A;  . ANTERIOR AND POSTERIOR REPAIR N/A 01/10/2016   Procedure:  POSTERIOR REPAIR (RECTOCELE);  Surgeon: Nunzio Cobbs, MD;  Location: [facility];  Service: Gynecology;  Laterality: N/A;  . BLADDER SUSPENSION N/A 01/10/2016   Procedure: TRANSVAGINAL TAPE (TVT) PROCEDURE exact midurethral sling;  Surgeon: Nunzio Cobbs, MD;  Location: [facility];  Service: Gynecology;  Laterality: N/A;  . CERVICAL LAMINECTOMY  2001 and 2995   Dr Arnoldo Morale  . CYSTO N/A 01/10/2016   Procedure: CYSTOSCOPY;  Surgeon: Nunzio Cobbs, MD;  Location: [facility];  Service: Gynecology;  Laterality: N/A;  . SALPINGOOPHORECTOMY Bilateral 01/10/2016   Procedure: BILATERAL SALPINGO OOPHORECTOMY;  Surgeon: Nunzio Cobbs, MD;  Location: [facility];  Service: Gynecology;  Laterality: Bilateral;  . TUBAL LIGATION  1980    Family History  Problem Relation Age of Onset  . Heart attack Father        dec heart disease age 44  . Hypertension Father   . Lung disease Father   . Cancer Mother        dec stomach ca--age 36  . Breast  cancer Cousin   . Arthritis Other   . Breast cancer Paternal Aunt     Social History   Tobacco Use  . Smoking status: Former Smoker    Packs/day: 1.00    Years: 35.00    Pack years: 35.00    Types: Cigarettes    Start date: 11/21/1962    Last attempt to quit: 05/06/2011    Years since quitting: 6.9  . Smokeless tobacco: Never Used  . Tobacco comment: stopped periodically - peak rate of 1 ppd  Substance Use Topics  . Alcohol use: No    Alcohol/week: 0.0 oz    Subjective:  Patient came to office today without appointment asking to be seen with concerns for 4 week history of right foot pain/ discoloration of bottom of her foot x 1 week; notes  that bottom of her right foot has been "black" for the past week but symptoms resolved yesterday after numerous epsom salt soaks; notes that her right foot "just hurts" but she denies any injury or trauma; is also concerned about localized area of swelling at base of ankle of right foot- no pain or swelling or redness in calf; has googled her symptoms and decided that she has sepsis and has to have an antibiotic today because of underlying lung condition;   Objective:  Vitals:   04/03/18 1629  BP: 116/80  Pulse: 70  Temp: 98.3 F (36.8 C)  TempSrc: Oral  SpO2: 99%  Weight: 148 lb 0.6 oz (67.2 kg)  Height: 5\' 6"  (1.676 m)    General: Well developed, well nourished, in no acute distress  Skin : Warm and dry. No darkness or discoloration noted on bottom of foot Head: Normocephalic and atraumatic  Musculoskeletal: No deformities; no active joint inflammation  Extremities: No edema, cyanosis, clubbing; localized area of swelling noted at base of right ankle- suspect fatty deposit Vessels: Symmetric bilaterally  Neurologic: Alert and oriented; speech intact; face symmetrical; moves all extremities well; CNII-XII intact without focal deficit  Assessment:  1. Right foot pain     Plan:  Obvious anxiety issues seen today- patient was adamant that  testing be done; Due to patient's insistence about her symptoms, will update foot X-ray, CBC today; repeated reassurance that she is not septic and no discoloration of the foot is seen; will update ultrasound of lower extremity as well; follow-up to be determined;  No follow-ups on file.  Orders Placed This Encounter  Procedures  . DG Foot Complete Right    Standing Status:   Future    Number of Occurrences:   1    Standing Expiration Date:   06/04/2019    Order Specific Question:   Reason for Exam (SYMPTOM  OR DIAGNOSIS REQUIRED)    Answer:   foot pain    Order Specific Question:   Preferred imaging location?    Answer:   [facility]    Order Specific Question:   Radiology Contrast Protocol - do NOT remove file path    Answer:   [email or web address]  . [place] RT LOWER EXTREM LTD SOFT TISSUE NON VASCULAR    Standing Status:   Future    Standing Expiration Date:   06/04/2019    Order Specific Question:   Reason for Exam (SYMPTOM  OR DIAGNOSIS REQUIRED)    Answer:   localized area of swelling above right ankle    Order Specific Question:   Preferred imaging location?    Answer:   [facility]  . CBC w/Diff    Standing Status:   Future    Number of Occurrences:   1    Standing Expiration Date:   04/03/2019    Requested Prescriptions    No prescriptions requested or ordered in this encounter

## 2018-04-03 NOTE — Telephone Encounter (Signed)
noted 

## 2018-04-03 NOTE — Telephone Encounter (Signed)
Spoke with patient. She explained that she was recently at the beach and had to come home early due to extreme right ankle pain. She stated that earlier this week, her ankle and sole of her foot turned black. She also stated that she felt a mass in her lower leg.   She contacted her PCP and they advised her to come into the office. She has an appt with primary at 430. They told her that it could be sepsis and she would need antibiotics. She is scared because she has bronchiectasis and MAI.   She just wanted BQ to be aware. Will route to BQ.

## 2018-04-04 ENCOUNTER — Other Ambulatory Visit: Payer: Self-pay | Admitting: Family

## 2018-04-04 DIAGNOSIS — M7730 Calcaneal spur, unspecified foot: Secondary | ICD-10-CM

## 2018-04-04 NOTE — Progress Notes (Signed)
p 

## 2018-04-09 ENCOUNTER — Other Ambulatory Visit: Payer: Self-pay | Admitting: Family

## 2018-04-09 ENCOUNTER — Telehealth: Payer: Self-pay | Admitting: Internal Medicine

## 2018-04-09 DIAGNOSIS — M7989 Other specified soft tissue disorders: Secondary | ICD-10-CM

## 2018-04-09 NOTE — Telephone Encounter (Signed)
Copied from [place] [id number]. Topic: Quick Communication - See Telephone Encounter >> Apr 09, 2018  2:48 PM Neva Seat wrote: Pt hasn't heard anything back to schedule a scan or something for her ankle/foot pain. Please call pt back to let her know the status and next step asap.

## 2018-04-09 NOTE — Telephone Encounter (Signed)
Can you update her referral for [place] so she can go to [facility]?  Thanks

## 2018-04-09 NOTE — Telephone Encounter (Signed)
Pt would like her [place] sent to [place].

## 2018-04-09 NOTE — Telephone Encounter (Signed)
The U/s has  Been changed to [facility] and I did a podiatry referral for her last week.

## 2018-04-15 ENCOUNTER — Ambulatory Visit: Payer: Medicare Other | Admitting: Psychology

## 2018-04-28 ENCOUNTER — Ambulatory Visit (HOSPITAL_COMMUNITY): Payer: Medicare Other

## 2018-05-09 ENCOUNTER — Telehealth: Payer: Self-pay | Admitting: Internal Medicine

## 2018-05-09 DIAGNOSIS — R21 Rash and other nonspecific skin eruption: Secondary | ICD-10-CM | POA: Diagnosis not present

## 2018-05-09 DIAGNOSIS — K219 Gastro-esophageal reflux disease without esophagitis: Secondary | ICD-10-CM | POA: Diagnosis not present

## 2018-05-09 DIAGNOSIS — Z79899 Other long term (current) drug therapy: Secondary | ICD-10-CM | POA: Diagnosis not present

## 2018-05-09 NOTE — Telephone Encounter (Unsigned)
Copied from [place] [id number]. Topic: Quick Communication - See Telephone Encounter >> May 09, 2018  3:17 PM Percell Belt A wrote: CRM for notification. See Telephone encounter for: 05/09/18.  Pt called in and stated that she has poison oak for 3 days and it is spreading.  She stated that she is out of town and can come in.  She stated it has spread between her legs and is close to her vaginal area.  She is scared it is going move into her vaginal area.  She would like to know if she could get some prednisone  called in asap?    Pharmacy - [facility] in [place] number  [phone]

## 2018-05-09 NOTE — Telephone Encounter (Signed)
Please advise 

## 2018-05-11 MED ORDER — METHYLPREDNISOLONE 4 MG PO TBPK: ORAL_TABLET | ORAL | 0 refills | Status: DC

## 2018-05-11 MED ORDER — TRIAMCINOLONE ACETONIDE 0.1 % EX OINT: 1.0000 "application " | TOPICAL_OINTMENT | Freq: Two times a day (BID) | CUTANEOUS | 3 refills | Status: DC

## 2018-05-11 NOTE — Telephone Encounter (Signed)
Ok medrol dose-pack Use topical triamcinolone Thx

## 2018-05-12 NOTE — Telephone Encounter (Signed)
Unable to reach pt, no VM 

## 2018-05-13 NOTE — Telephone Encounter (Signed)
Pt.notified

## 2018-05-16 ENCOUNTER — Ambulatory Visit (INDEPENDENT_AMBULATORY_CARE_PROVIDER_SITE_OTHER): Payer: Medicare Other | Admitting: Internal Medicine

## 2018-05-16 ENCOUNTER — Other Ambulatory Visit (INDEPENDENT_AMBULATORY_CARE_PROVIDER_SITE_OTHER): Payer: Medicare Other

## 2018-05-16 ENCOUNTER — Encounter: Payer: Self-pay | Admitting: Internal Medicine

## 2018-05-16 VITALS — BP 126/82 | HR 56 | Temp 98.0°F | Ht 66.0 in | Wt 149.0 lb

## 2018-05-16 DIAGNOSIS — Z0001 Encounter for general adult medical examination with abnormal findings: Secondary | ICD-10-CM | POA: Diagnosis not present

## 2018-05-16 DIAGNOSIS — E538 Deficiency of other specified B group vitamins: Secondary | ICD-10-CM

## 2018-05-16 DIAGNOSIS — Z0000 Encounter for general adult medical examination without abnormal findings: Secondary | ICD-10-CM | POA: Diagnosis not present

## 2018-05-16 DIAGNOSIS — R5383 Other fatigue: Secondary | ICD-10-CM

## 2018-05-16 DIAGNOSIS — R140 Abdominal distension (gaseous): Secondary | ICD-10-CM

## 2018-05-16 DIAGNOSIS — Z23 Encounter for immunization: Secondary | ICD-10-CM | POA: Diagnosis not present

## 2018-05-16 DIAGNOSIS — E559 Vitamin D deficiency, unspecified: Secondary | ICD-10-CM

## 2018-05-16 LAB — LIPID PANEL
CHOL/HDL RATIO: 3
Cholesterol: 180 mg/dL (ref 0–200)
HDL: 69.8 mg/dL (ref >39.00)
LDL CALC: 96 mg/dL (ref 0–99)
NONHDL: 109.83
TRIGLYCERIDES: 67 mg/dL (ref 0.0–149.0)
VLDL: 13.4 mg/dL (ref 0.0–40.0)

## 2018-05-16 LAB — URINALYSIS
BILIRUBIN URINE: NEGATIVE
KETONES UR: NEGATIVE
Leukocytes, UA: NEGATIVE
Nitrite: NEGATIVE
SPECIFIC GRAVITY, URINE: 1.02 (ref 1.000–1.030)
Total Protein, Urine: NEGATIVE
URINE GLUCOSE: NEGATIVE
Urobilinogen, UA: 0.2 (ref 0.0–1.0)
pH: 6 (ref 5.0–8.0)

## 2018-05-16 LAB — CBC WITH DIFFERENTIAL/PLATELET
BASOS ABS: 0.1 10*3/uL (ref 0.0–0.1)
Basophils Relative: 0.9 % (ref 0.0–3.0)
EOS ABS: 0.1 10*3/uL (ref 0.0–0.7)
Eosinophils Relative: 0.5 % (ref 0.0–5.0)
HCT: 39.2 % (ref 36.0–46.0)
Hemoglobin: 13.3 g/dL (ref 12.0–15.0)
LYMPHS ABS: 2 10*3/uL (ref 0.7–4.0)
Lymphocytes Relative: 18.6 % (ref 12.0–46.0)
MCHC: 33.8 g/dL (ref 30.0–36.0)
MCV: 85.3 fl (ref 78.0–100.0)
MONO ABS: 0.9 10*3/uL (ref 0.1–1.0)
Monocytes Relative: 8.1 % (ref 3.0–12.0)
NEUTROS ABS: 7.6 10*3/uL (ref 1.4–7.7)
NEUTROS PCT: 71.9 % (ref 43.0–77.0)
PLATELETS: 197 10*3/uL (ref 150.0–400.0)
RBC: 4.6 Mil/uL (ref 3.87–5.11)
RDW: 14.2 % (ref 11.5–15.5)
WBC: 10.6 10*3/uL — ABNORMAL HIGH (ref 4.0–10.5)

## 2018-05-16 LAB — BASIC METABOLIC PANEL
BUN: 18 mg/dL (ref 6–23)
CHLORIDE: 105 meq/L (ref 96–112)
CO2: 30 meq/L (ref 19–32)
Calcium: 9.4 mg/dL (ref 8.4–10.5)
Creatinine, Ser: 0.94 mg/dL (ref 0.40–1.20)
GFR: 63.82 mL/min (ref >60.00)
GLUCOSE: 88 mg/dL (ref 70–99)
Potassium: 3.4 mEq/L — ABNORMAL LOW (ref 3.5–5.1)
Sodium: 142 mEq/L (ref 135–145)

## 2018-05-16 LAB — HEPATIC FUNCTION PANEL
ALBUMIN: 4.1 g/dL (ref 3.5–5.2)
ALK PHOS: 57 U/L (ref 39–117)
ALT: 12 U/L (ref 0–35)
AST: 9 U/L (ref 0–37)
Bilirubin, Direct: 0.1 mg/dL (ref 0.0–0.3)
Total Bilirubin: 0.6 mg/dL (ref 0.2–1.2)
Total Protein: 6.7 g/dL (ref 6.0–8.3)

## 2018-05-16 LAB — TSH: TSH: 0.67 u[IU]/mL (ref 0.35–4.50)

## 2018-05-16 MED ORDER — ALIGN 4 MG PO CAPS: 1.0000 | ORAL_CAPSULE | Freq: Every day | ORAL | 0 refills | Status: DC

## 2018-05-16 MED ORDER — METRONIDAZOLE 500 MG PO TABS: 500.0000 mg | ORAL_TABLET | Freq: Three times a day (TID) | ORAL | 0 refills | Status: DC

## 2018-05-16 MED ORDER — OXYCODONE-ACETAMINOPHEN 5-325 MG PO TABS
1.0000 | ORAL_TABLET | Freq: Two times a day (BID) | ORAL | 0 refills | Status: DC | PRN
Start: 2018-05-16 — End: 2018-05-16

## 2018-05-16 MED ORDER — OXYCODONE-ACETAMINOPHEN 5-325 MG PO TABS: 1.0000 | ORAL_TABLET | Freq: Two times a day (BID) | ORAL | 0 refills | Status: DC | PRN

## 2018-05-16 NOTE — Assessment & Plan Note (Signed)
Vit D 

## 2018-05-16 NOTE — Assessment & Plan Note (Signed)
Here for medicare wellness/physical  Diet: heart healthy  Physical activity: sedentary  Depression/mood screen: negative  Hearing: intact to whispered voice  Visual acuity: grossly normal, performs annual eye exam  ADLs: capable  Fall risk: none  Home safety: good  Cognitive evaluation: intact to orientation, naming, recall and repetition  EOL planning: adv directives, full code/ I agree  I have personally reviewed and have noted  1. The patient's medical and social history  2. Their use of alcohol, tobacco or illicit drugs  3. Their current medications and supplements  4. The patient's functional ability including ADL's, fall risks, home safety risks and hearing or visual impairment.  5. Diet and physical activities  6. Evidence for depression or mood disorders - on Rx    Today patient counseled on age appropriate routine health concerns for screening and prevention, each reviewed and up to date or declined. Immunizations reviewed and up to date or declined. Labs ordered and reviewed. Risk factors for depression reviewed and negative. Hearing function and visual acuity are intact. ADLs screened and addressed as needed. Functional ability and level of safety reviewed and appropriate. Education, counseling and referrals performed based on assessed risks today. Patient provided with a copy of personalized plan for preventive services.

## 2018-05-16 NOTE — Assessment & Plan Note (Signed)
[facility] Labs 

## 2018-05-16 NOTE — Assessment & Plan Note (Signed)
Flagyl x10 followed by Align x 1 mo

## 2018-05-16 NOTE — Assessment & Plan Note (Signed)
On B12 

## 2018-05-16 NOTE — Progress Notes (Signed)
Subjective:  Patient ID: Melody [name], female    DOB: Mar 29, 1956  Age: 62 y.o. MRN: [id number]  CC: No chief complaint on file.   HPI Melody [name] presents for a well exam C/o R heel spurr F/u LBP, anxiety C/o chronic gas  Outpatient Medications Prior to Visit  Medication Sig Dispense Refill  . albuterol (PROVENTIL HFA;VENTOLIN HFA) 108 (90 Base) MCG/ACT inhaler Inhale 2 puffs every 6 (six) hours as needed into the lungs for wheezing or shortness of breath. 1 Inhaler 6  . albuterol (PROVENTIL) (2.5 MG/3ML) 0.083% nebulizer solution Take 3 mLs (2.5 mg total) by nebulization every 6 (six) hours as needed for wheezing or shortness of breath. 75 mL 12  . ALPRAZolam (XANAX) 1 MG tablet Take 1 mg by mouth 4 (four) times daily.     . Azelastine-Fluticasone (DYMISTA) 137-50 MCG/ACT SUSP Place 1 spray 2 (two) times daily into the nose. 1 Bottle 3  . BREO ELLIPTA 100-25 MCG/INH AEPB inhale ONE PUFF into THE lungs DAILY 60 each 11  . cholecalciferol (VITAMIN D) 1000 units tablet Take 1,000 Units by mouth once a week.    . cyanocobalamin (,VITAMIN B-12,) 1000 MCG/ML injection INJECT 1 ML INTO THE MUSCLE EVERY 14 DAYS. 2 mL 5  . fluticasone (FLONASE) 50 MCG/ACT nasal spray USE 2 SPRAYS IN BOTH NOSTRILS DAILY. 16 g 11  . lamoTRIgine (LAMICTAL) 25 MG tablet Take 25 mg by mouth daily.  1  . levocetirizine (XYZAL) 5 MG tablet TAKE 1 TABLET BY MOUTH EVERY EVENING. 30 tablet 11  . levothyroxine (SYNTHROID, LEVOTHROID) 50 MCG tablet TAKE ONE TABLET BY MOUTH DAILY BEFORE breakfast 30 tablet 11  . methylphenidate (RITALIN) 20 MG tablet TAKE ONE TABLET BY MOUTH EVERY MORNING AND AT NOON  0  . methylPREDNISolone (MEDROL DOSEPAK) 4 MG TBPK tablet As directed 21 tablet 0  . oxyCODONE-acetaminophen (PERCOCET/ROXICET) 5-325 MG tablet Take 1 tablet by mouth 2 (two) times daily as needed for severe pain. 60 tablet 0  . pantoprazole (PROTONIX) 40 MG tablet Take 1 tablet (40 mg total) by mouth daily. 30 tablet 5    . QUEtiapine (SEROQUEL) 25 MG tablet Take 25 mg by mouth at bedtime.     Marland Kitchen Respiratory Therapy Supplies (FLUTTER) DEVI Use as directed 1 each 0  . SUMAtriptan (IMITREX) 100 MG tablet Take 1 tablet (100 mg total) by mouth every 2 (two) hours as needed for migraine. May repeat in 2 hours if headache persists or recurs. 10 tablet 3  . triamcinolone ointment (KENALOG) 0.1 % Apply 1 application topically 2 (two) times daily. 80 g 3  . Vitamin D, Ergocalciferol, (DRISDOL) 50000 units CAPS capsule Take 1 capsule (50,000 Units total) by mouth every 7 (seven) days. 12 capsule 0   No facility-administered medications prior to visit.     ROS: Review of Systems  Constitutional: Positive for fatigue. Negative for activity change, appetite change, chills and unexpected weight change.  HENT: Negative for congestion, mouth sores and sinus pressure.   Eyes: Negative for visual disturbance.  Respiratory: Negative for cough and chest tightness.   Gastrointestinal: Negative for abdominal pain and nausea.  Genitourinary: Negative for difficulty urinating, frequency and vaginal pain.  Musculoskeletal: Positive for arthralgias and back pain. Negative for gait problem.  Skin: Negative for pallor and rash.  Neurological: Positive for headaches. Negative for dizziness, tremors, weakness and numbness.  Psychiatric/Behavioral: Positive for sleep disturbance. Negative for confusion. The patient is nervous/anxious.     Objective:  BP  126/82 (BP Location: Left Arm, Patient Position: Sitting, Cuff Size: Normal)   Pulse (!) 56   Temp 98 F (36.7 C) (Oral)   Ht 5\' 6"  (1.676 m)   Wt 149 lb (67.6 kg)   LMP 10/29/2006 (Approximate)   SpO2 97%   BMI 24.05 kg/m   BP Readings from Last 3 Encounters:  05/16/18 126/82  04/03/18 116/80  03/12/18 134/80    Wt Readings from Last 3 Encounters:  05/16/18 149 lb (67.6 kg)  04/03/18 148 lb 0.6 oz (67.2 kg)  03/12/18 148 lb 9.6 oz (67.4 kg)    Physical Exam   Constitutional: She appears well-developed. No distress.  HENT:  Head: Normocephalic.  Right Ear: External ear normal.  Left Ear: External ear normal.  Nose: Nose normal.  Mouth/Throat: Oropharynx is clear and moist.  Eyes: Pupils are equal, round, and reactive to [place]. Conjunctivae are normal. Right eye exhibits no discharge. Left eye exhibits no discharge.  Neck: Normal range of motion. Neck supple. No JVD present. No tracheal deviation present. No thyromegaly present.  Cardiovascular: Normal rate, regular rhythm and normal heart sounds.  Pulmonary/Chest: No stridor. No respiratory distress. She has no wheezes.  Abdominal: Soft. Bowel sounds are normal. She exhibits no distension and no mass. There is no tenderness. There is no rebound and no guarding.  Musculoskeletal: She exhibits no edema or tenderness.  Lymphadenopathy:    She has no cervical adenopathy.  Neurological: She displays normal reflexes. No cranial nerve deficit. She exhibits normal muscle tone. Coordination normal.  Skin: No rash noted. No erythema.  Psychiatric: She has a normal mood and affect. Her behavior is normal. Judgment and thought content normal.    Lab Results  Component Value Date   WBC 6.2 04/03/2018   HGB 14.4 04/03/2018   HCT 42.8 04/03/2018   PLT 171.0 04/03/2018   GLUCOSE 91 07/03/2017   CHOL 186 09/03/2014   TRIG 64.0 09/03/2014   HDL 55.00 09/03/2014   LDLCALC 118 (H) 09/03/2014   ALT 11 (L) 07/03/2017   AST 14 (L) 07/03/2017   NA 138 07/03/2017   K 3.2 (L) 07/03/2017   CL 103 07/03/2017   CREATININE 0.87 07/03/2017   BUN 14 07/03/2017   CO2 28 07/03/2017   TSH 3.19 08/17/2016    Dg Foot Complete Right  Result Date: 04/04/2018 CLINICAL DATA:  Right foot pain and swelling for 2 months, no known injury, initial encounter EXAM: RIGHT FOOT COMPLETE - 3+ VIEW COMPARISON:  None. FINDINGS: No acute fracture or dislocation is noted. No soft tissue abnormality is noted. Small plantar calcaneal  spurring is noted. IMPRESSION: Mild degenerative change without acute abnormality Electronically Signed   By: Inez Catalina M.D.   On: 04/04/2018 08:43    Assessment & Plan:   There are no diagnoses linked to this encounter.   No orders of the defined types were placed in this encounter.    Follow-up: No follow-ups on file.  Walker Kehr, MD

## 2018-05-16 NOTE — Addendum Note (Signed)
Addended by: Karren Cobble on: 05/16/2018 02:36 PM   Modules accepted: Orders

## 2018-06-11 ENCOUNTER — Other Ambulatory Visit: Payer: Self-pay

## 2018-06-11 ENCOUNTER — Emergency Department (HOSPITAL_COMMUNITY)
Admission: EM | Admit: 2018-06-11 | Discharge: 2018-06-12 | Disposition: A | Discharge status: Home / Self Care | Payer: Medicare Other | Attending: Emergency Medicine | Admitting: Emergency Medicine

## 2018-06-11 ENCOUNTER — Encounter (HOSPITAL_COMMUNITY): Payer: Self-pay | Admitting: Emergency Medicine

## 2018-06-11 DIAGNOSIS — E039 Hypothyroidism, unspecified: Secondary | ICD-10-CM | POA: Diagnosis not present

## 2018-06-11 DIAGNOSIS — Z79899 Other long term (current) drug therapy: Secondary | ICD-10-CM | POA: Insufficient documentation

## 2018-06-11 DIAGNOSIS — R3915 Urgency of urination: Secondary | ICD-10-CM | POA: Diagnosis present

## 2018-06-11 DIAGNOSIS — N390 Urinary tract infection, site not specified: Secondary | ICD-10-CM | POA: Diagnosis not present

## 2018-06-11 DIAGNOSIS — Z87891 Personal history of nicotine dependence: Secondary | ICD-10-CM | POA: Insufficient documentation

## 2018-06-11 LAB — URINALYSIS, ROUTINE W REFLEX MICROSCOPIC
Bilirubin Urine: NEGATIVE
Glucose, UA: NEGATIVE mg/dL
Ketones, ur: NEGATIVE mg/dL
Nitrite: POSITIVE — AB
PROTEIN: 100 mg/dL — AB
RBC / HPF: >50 RBC/hpf — ABNORMAL HIGH (ref 0–5)
Specific Gravity, Urine: 1.021 (ref 1.005–1.030)
WBC, UA: >50 WBC/hpf — ABNORMAL HIGH (ref 0–5)
pH: 5 (ref 5.0–8.0)

## 2018-06-11 MED ORDER — CEPHALEXIN 500 MG PO CAPS
500.0000 mg | ORAL_CAPSULE | Freq: Once | ORAL | Status: AC
Start: 2018-06-11 — End: 2018-06-12
  Administered 2018-06-12: 500 mg via ORAL
  Filled 2018-06-11: qty 1

## 2018-06-11 NOTE — ED Triage Notes (Signed)
Pt C/O urinary urgency and frequency that started yesterday. Pt C/O sharp pain with urination.

## 2018-06-12 DIAGNOSIS — N390 Urinary tract infection, site not specified: Secondary | ICD-10-CM | POA: Diagnosis not present

## 2018-06-12 MED ORDER — CEPHALEXIN 500 MG PO CAPS: 500.0000 mg | ORAL_CAPSULE | Freq: Four times a day (QID) | ORAL | 0 refills | Status: DC

## 2018-06-12 NOTE — Discharge Instructions (Addendum)
Continue to drink plenty of water.  Take the antibiotic as directed until its finished.  Follow-up with your doctor for recheck.  Return here for any worsening symptoms such as fever, vomiting, or increasing pain.

## 2018-06-12 NOTE — ED Provider Notes (Signed)
[facility] EMERGENCY DEPARTMENT Provider Note   CSN: [id number] Arrival date & time: 06/11/18  2122     History   Chief Complaint Chief Complaint  Patient presents with  . Urinary Urgency    HPI Melody [name] is a 62 y.o. female.  HPI   Melody [name] is a 62 y.o. female who presents to the Emergency Department complaining of urinary frequency and urgency for 2 days.  She describes a burning pain with urination and voiding small amounts.  She also states that her urine appears dark in color.  She has been drinking water and taking over-the-counter pain relievers without relief.  She denies back pain, fever, chills, vomiting, and abdominal pain.  She states she is not currently sexually active.    Past Medical History:  Diagnosis Date  . Anxiety   . Bronchiectasis ([place])   . Chronic back pain   . Chronic neck pain   . Depression    bipolar- Dr [name]  . Fatigue   . Fibromyalgia   . GERD (gastroesophageal reflux disease)   . Headache   . Hypothyroidism   . Low back pain    Dr Arnoldo Morale  . 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([place])   . Osteoarthritis   . Pneumonia   . Pulmonary nodule   . Sinus congestion   . Thyroid disease    hypothyroidsm  . Vertigo   . Vitamin B12 deficiency     Patient Active Problem List   Diagnosis Date Noted  . Meteorism 05/16/2018  . Emotional stress 12/25/2017  . 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([place]) 10/16/2017  . Bursitis 05/21/2017  . Knee pain, chronic 05/21/2017  . Cough 04/04/2017  . Multiple lung nodules on CT 04/04/2017  . Change in voice 04/04/2017  . Fatigue 02/08/2016  . Complete uterovaginal prolapse 01/10/2016  . Status post total abdominal hysterectomy and bilateral salpingo-oophorectomy 01/10/2016  . Urinary incontinence 11/09/2015  . Vaginal discomfort 08/10/2015  . Rectocele 08/10/2015  . Well adult exam 09/03/2014  . Chronic obstructive airway disease with asthma ([place]) 09/03/2014  . UTI (urinary  tract infection) 06/09/2014  . Dysplastic toenail 09/02/2013  . Aphthous ulcer 06/11/2013  . Dark urine 03/18/2013  . Rash 06/08/2012  . Contact dermatitis 04/13/2011  . PRURITUS 12/01/2010  . EXPOSURE TO HAZARDOUS BODY FLUID, HX OF 05/29/2010  . HIP PAIN 12/05/2009  . RUQ PAIN 02/04/2009  . WRIST PAIN 05/24/2008  . CERVICAL STRAIN 05/24/2008  . WARTS, VIRAL, UNSPECIFIED 04/06/2008  . NEOPLASM, SKIN, UNCERTAIN BEHAVIOR 70/26/3785  . Vitamin D deficiency 11/13/2007  . Hypothyroidism 08/05/2007  . Bipolar I disorder ([place]) 08/05/2007  . GERD 08/05/2007  . Anxiety state 07/23/2007  . Lumbar pain 07/23/2007  . Myalgia and myositis 07/23/2007  . B12 deficiency 05/22/2007  . IRON DEFICIENCY 05/22/2007    Past Surgical History:  Procedure Laterality Date  . ABDOMINAL HYSTERECTOMY N/A 01/10/2016   Procedure: HYSTERECTOMY ABDOMINAL;  Surgeon: Nunzio Cobbs, MD;  Location: [facility];  Service: Gynecology;  Laterality: N/A;  . ABDOMINAL SACROCOLPOPEXY N/A 01/10/2016   Procedure: ABDOMINO SACROCOLPOPEXY ;  Surgeon: Nunzio Cobbs, MD;  Location: [facility];  Service: Gynecology;  Laterality: N/A;  . ANTERIOR AND POSTERIOR REPAIR N/A 01/10/2016   Procedure:  POSTERIOR REPAIR (RECTOCELE);  Surgeon: Nunzio Cobbs, MD;  Location: [facility];  Service: Gynecology;  Laterality: N/A;  . BLADDER SUSPENSION N/A 01/10/2016   Procedure: TRANSVAGINAL TAPE (TVT) PROCEDURE exact midurethral sling;  Surgeon: Dietrich Pates  E Yisroel Ramming, MD;  Location: [facility];  Service: Gynecology;  Laterality: N/A;  . CERVICAL LAMINECTOMY  2001 and 2995   Dr Arnoldo Morale  . CYSTO N/A 01/10/2016   Procedure: CYSTOSCOPY;  Surgeon: Nunzio Cobbs, MD;  Location: [facility];  Service: Gynecology;  Laterality: N/A;  . SALPINGOOPHORECTOMY Bilateral 01/10/2016   Procedure: BILATERAL SALPINGO OOPHORECTOMY;  Surgeon: Nunzio Cobbs, MD;  Location: [facility];  Service: Gynecology;  Laterality: Bilateral;  .  TUBAL LIGATION  1980     OB History    Gravida  2   Para  2   Term  2   Preterm      AB      Living  2     SAB      TAB      Ectopic      Multiple      Live Births               Home Medications    Prior to Admission medications   Medication Sig Start Date End Date Taking? Authorizing Provider  albuterol (PROVENTIL HFA;VENTOLIN HFA) 108 (90 Base) MCG/ACT inhaler Inhale 2 puffs every 6 (six) hours as needed into the lungs for wheezing or shortness of breath. 09/17/17   Javier Glazier, MD  albuterol (PROVENTIL) (2.5 MG/3ML) 0.083% nebulizer solution Take 3 mLs (2.5 mg total) by nebulization every 6 (six) hours as needed for wheezing or shortness of breath. 01/30/18   Juanito Doom, MD  ALPRAZolam Duanne Moron) 1 MG tablet Take 1 mg by mouth 4 (four) times daily.     [provider]  Azelastine-Fluticasone (DYMISTA) 137-50 MCG/ACT SUSP Place 1 spray 2 (two) times daily into the nose. 09/12/17   Javier Glazier, MD  BREO ELLIPTA 100-25 MCG/INH AEPB inhale ONE PUFF into THE lungs DAILY 01/20/18   Plotnikov, Evie Lacks, MD  cholecalciferol (VITAMIN D) 1000 units tablet Take 1,000 Units by mouth once a week.    [provider]  cyanocobalamin (,VITAMIN B-12,) 1000 MCG/ML injection INJECT 1 ML INTO THE MUSCLE EVERY 14 DAYS. 01/28/18   Plotnikov, Evie Lacks, MD  fluticasone (FLONASE) 50 MCG/ACT nasal spray USE 2 SPRAYS IN BOTH NOSTRILS DAILY. 02/28/18   Plotnikov, Evie Lacks, MD  lamoTRIgine (LAMICTAL) 25 MG tablet Take 25 mg by mouth daily. 03/10/18   [provider]  levocetirizine (XYZAL) 5 MG tablet TAKE 1 TABLET BY MOUTH EVERY EVENING. 07/15/17   Plotnikov, Evie Lacks, MD  levothyroxine (SYNTHROID, LEVOTHROID) 50 MCG tablet TAKE ONE TABLET BY MOUTH DAILY BEFORE breakfast 02/11/18   Plotnikov, Evie Lacks, MD  methylphenidate (RITALIN) 20 MG tablet TAKE ONE TABLET BY MOUTH EVERY MORNING AND AT [facility] 10/03/17   [provider]  methylPREDNISolone  (MEDROL DOSEPAK) 4 MG TBPK tablet As directed 05/11/18   Plotnikov, Evie Lacks, MD  metroNIDAZOLE (FLAGYL) 500 MG tablet Take 1 tablet (500 mg total) by mouth 3 (three) times daily. 05/16/18   Plotnikov, Evie Lacks, MD  oxyCODONE-acetaminophen (PERCOCET/ROXICET) 5-325 MG tablet Take 1 tablet by mouth 2 (two) times daily as needed for severe pain. 05/16/18   Plotnikov, Evie Lacks, MD  pantoprazole (PROTONIX) 40 MG tablet Take 1 tablet (40 mg total) by mouth daily. 06/11/17   Plotnikov, Evie Lacks, MD  Probiotic Product (ALIGN) 4 MG CAPS Take 1 capsule (4 mg total) by mouth daily. Start after you finish Flagyl 05/16/18   Plotnikov, Evie Lacks, MD  QUEtiapine (SEROQUEL) 25 MG  tablet Take 25 mg by mouth at bedtime.     [provider]  Respiratory Therapy Supplies (FLUTTER) DEVI Use as directed 05/21/17   Javier Glazier, MD  SUMAtriptan (IMITREX) 100 MG tablet Take 1 tablet (100 mg total) by mouth every 2 (two) hours as needed for migraine. May repeat in 2 hours if headache persists or recurs. 02/11/18   Plotnikov, Evie Lacks, MD  triamcinolone ointment (KENALOG) 0.1 % Apply 1 application topically 2 (two) times daily. 05/11/18   Plotnikov, Evie Lacks, MD  Vitamin D, Ergocalciferol, (DRISDOL) 50000 units CAPS capsule Take 1 capsule (50,000 Units total) by mouth every 7 (seven) days. 11/12/17   Lyndal Pulley, DO    Family History Family History  Problem Relation Age of Onset  . Heart attack Father        dec heart disease age 22  . Hypertension Father   . Lung disease Father   . Cancer Mother        dec stomach ca--age 10  . Breast cancer Cousin   . Arthritis Other   . Breast cancer Paternal Aunt     Social History Social History   Tobacco Use  . Smoking status: Former Smoker    Packs/day: 1.00    Years: 35.00    Pack years: 35.00    Types: Cigarettes    Start date: 11/21/1962    Last attempt to quit: 05/06/2011    Years since quitting: 7.1  . Smokeless tobacco: Never Used  . Tobacco  comment: stopped periodically - peak rate of 1 ppd  Substance Use Topics  . Alcohol use: No    Alcohol/week: 0.0 standard drinks  . Drug use: No     Allergies   Doxycycline hyclate; Gabapentin; Nabumetone; Sulfa antibiotics; Tetracycline hcl; and Tramadol hcl   Review of Systems Review of Systems  Constitutional: Negative for activity change, appetite change, chills and fever.  Respiratory: Negative for chest tightness and shortness of breath.   Gastrointestinal: Negative for abdominal pain, nausea and vomiting.  Genitourinary: Positive for dysuria, frequency and urgency. Negative for decreased urine volume, difficulty urinating, flank pain, hematuria, vaginal bleeding and vaginal discharge.  Musculoskeletal: Negative for back pain.  Skin: Negative for rash.  Neurological: Negative for dizziness, weakness and numbness.  Hematological: Negative for adenopathy.  Psychiatric/Behavioral: Negative for confusion.     Physical Exam Updated Vital Signs BP 119/66 (BP Location: Right Arm)   Pulse 84   Temp 98.5 F (36.9 C) (Oral)   Resp 17   LMP 10/29/2006 (Approximate)   SpO2 96%   Physical Exam  Constitutional: She is oriented to person, place, and time. She appears well-developed and well-nourished. No distress.  HENT:  Head: Atraumatic.  Mouth/Throat: Oropharynx is clear and moist.  Cardiovascular: Normal rate, regular rhythm and intact distal pulses.  No murmur heard. Pulmonary/Chest: Effort normal and breath sounds normal. No respiratory distress. She has no wheezes. She has no rales.  Abdominal: Soft. Normal appearance. She exhibits no distension and no mass. There is no hepatosplenomegaly. There is tenderness in the suprapubic area. There is no rigidity, no rebound, no guarding, no CVA tenderness and no tenderness at McBurney's point.  abdomen is soft, non-tender without guarding or rebound tenderness. No CVA tenderness  Musculoskeletal: Normal range of motion. She  exhibits no edema.  Neurological: She is alert and oriented to person, place, and time. Coordination normal.  Skin: Skin is warm and dry. Capillary refill takes less than 2 seconds. No rash  noted.  Nursing note and vitals reviewed.    ED Treatments / Results  Labs (all labs ordered are listed, but only abnormal results are displayed) Labs Reviewed  URINALYSIS, ROUTINE W REFLEX MICROSCOPIC - Abnormal; Notable for the following components:      Result Value   APPearance CLOUDY (*)    Hgb urine dipstick LARGE (*)    Protein, ur 100 (*)    Nitrite POSITIVE (*)    Leukocytes, UA LARGE (*)    RBC / HPF >50 (*)    WBC, UA >50 (*)    Bacteria, UA RARE (*)    All other components within normal limits  URINE CULTURE    EKG None  Radiology No results found.  Procedures Procedures (including critical care time)  Medications Ordered in ED Medications  cephALEXin (KEFLEX) capsule 500 mg (500 mg Oral Given 06/12/18 0005)     Initial Impression / Assessment and Plan / ED Course  I have reviewed the triage vital signs and the nursing notes.  Pertinent labs & imaging results that were available during my care of the patient were reviewed by me and considered in my medical decision making (see chart for details).     Pt well appearing.  Vitals reviewed.  Non-toxic.  No CVA tenderness, vomiting or fever.  Urine culture pending.  First dose of Keflex given here.  She agrees to tx plan with abx, and close PCP f/u.  Return precautions discussed.    Final Clinical Impressions(s) / ED Diagnoses   Final diagnoses:  Urinary tract infection in female    ED Discharge Orders    None       Kem Parkinson, PA-C 06/12/18 0027    Rolland Porter, MD 06/12/18 [phone]

## 2018-06-26 ENCOUNTER — Other Ambulatory Visit: Payer: Self-pay | Admitting: Internal Medicine

## 2018-07-08 ENCOUNTER — Other Ambulatory Visit: Payer: Self-pay | Admitting: Internal Medicine

## 2018-07-08 NOTE — Telephone Encounter (Signed)
Please advise.  Checked controlled substance database, last filled 04/13/18

## 2018-07-08 NOTE — Telephone Encounter (Signed)
Copied from [place] [phone]. Topic: Quick Communication - Rx Refill/Question >> Jul 08, 2018  4:57 PM Oliver Pila B wrote: Medication: oxyCODONE-acetaminophen (PERCOCET/ROXICET) 5-325 MG tablet [[id number]]   Pt called b/c she has misplaced her printed copy of her Rx above; the pharmacy has no record of filling; pt is requesting to have another copy printed in order to fill; contact pt to advise

## 2018-07-09 MED ORDER — OXYCODONE-ACETAMINOPHEN 5-325 MG PO TABS: 1.0000 | ORAL_TABLET | Freq: Two times a day (BID) | ORAL | 0 refills | Status: DC | PRN

## 2018-07-09 NOTE — Telephone Encounter (Signed)
Ok I'll refill Thx

## 2018-07-10 NOTE — Telephone Encounter (Signed)
Spoke with patient and info given 

## 2018-07-21 ENCOUNTER — Other Ambulatory Visit: Payer: Self-pay | Admitting: Internal Medicine

## 2018-08-06 ENCOUNTER — Ambulatory Visit (INDEPENDENT_AMBULATORY_CARE_PROVIDER_SITE_OTHER): Payer: Medicare Other | Admitting: Internal Medicine

## 2018-08-06 ENCOUNTER — Encounter: Payer: Self-pay | Admitting: Internal Medicine

## 2018-08-06 ENCOUNTER — Ambulatory Visit (INDEPENDENT_AMBULATORY_CARE_PROVIDER_SITE_OTHER): Payer: Medicare Other | Admitting: Pulmonary Disease

## 2018-08-06 VITALS — BP 120/72 | HR 80 | Temp 98.6°F | Ht 66.0 in | Wt 140.0 lb

## 2018-08-06 VITALS — BP 128/76 | HR 66 | Ht 66.0 in | Wt 140.6 lb

## 2018-08-06 DIAGNOSIS — J479 Bronchiectasis, uncomplicated: Secondary | ICD-10-CM | POA: Diagnosis not present

## 2018-08-06 DIAGNOSIS — E034 Atrophy of thyroid (acquired): Secondary | ICD-10-CM

## 2018-08-06 DIAGNOSIS — R457 State of emotional shock and stress, unspecified: Secondary | ICD-10-CM

## 2018-08-06 DIAGNOSIS — J449 Chronic obstructive pulmonary disease, unspecified: Secondary | ICD-10-CM

## 2018-08-06 DIAGNOSIS — F411 Generalized anxiety disorder: Secondary | ICD-10-CM | POA: Diagnosis not present

## 2018-08-06 DIAGNOSIS — E538 Deficiency of other specified B group vitamins: Secondary | ICD-10-CM | POA: Diagnosis not present

## 2018-08-06 DIAGNOSIS — Z23 Encounter for immunization: Secondary | ICD-10-CM

## 2018-08-06 MED ORDER — OXYCODONE-ACETAMINOPHEN 5-325 MG PO TABS: 1.0000 | ORAL_TABLET | Freq: Two times a day (BID) | ORAL | 0 refills | Status: DC | PRN

## 2018-08-06 NOTE — Patient Instructions (Addendum)
How to Evict a Family Member From a House April 16, 2016  By: Alycia Rossetti  If a relative you have invited into your home is becoming a problem, you can evict him. Here's a guide on how to evict a family member. Many people allow family members to move in and live with them, but unfortunately it doesn't always work out. If you ask your family member to leave and he refuses, your only other option may be to legally evict him. Evicting a family member from your home is a tricky task that should be carefully contemplated and executed by following all of your local laws. Know Your Rights Research the eviction laws in your county. These vary from county to county, so it's important to know what your legal rights are in your local community. Your county or state's official website should have this information. Another option is to ask your county clerk.    Document everything. Keep detailed notes, keeping to facts only and avoiding personal issues. Take pictures of any damage or physical proof that you can. If your relative challenges the eviction or there is any other reason that you must attend court, factual proof of your claims will help the judge make an informed decision. Get the Required Form Download, print or pick up the correct form to serve your relative with a legal eviction notice. The correct form will depend on your legal reason for the eviction. For example, if nonpayment of rent is the reason for the eviction, serve a notice to pay or quit order. This gives your family member an exact date and amount that rent has to be paid, or he must vacate the premises. If there isn't a lease or if it's expired, a written notice to vacate the premises is all that's needed. Fill out the form using clear, precise, professional language. For example, write the month, day and year the relative must move out and use his full name. When stating the reason for the eviction, state the facts and leave personal  issues and annoyances that aren't related to his relationship as a tenant off of your legal paperwork. Serve Notice Serve your relative with the legal notice required in your county. Ask him to sign and date the notice if possible, or if required by the laws in your county, then give him a copy. If he doesn't comply with the reason, such as paying rent that's owed or legally unacceptable behavior, he must leave by the deadline given. Get a Sales executive a lawyer for legal advice, or go to your courthouse to file a petition of eviction to have your relative ordered to leave by the court. The judge can order your relative to vacate your property. If your relative refuses the court order, you can then ask law enforcement to physically remove him.

## 2018-08-06 NOTE — Assessment & Plan Note (Signed)
Chronic  Potential benefits of a long term benzodiazepines  use as well as potential risks  and complications were explained to the patient and were aknowledged. Dr [name] Seroquel, Xanax prn Info [id number] and court order  given
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
## 2018-08-06 NOTE — Patient Instructions (Addendum)
Continue Breo and albuterol as needed Continue current medications Continue flutter valva twice daily Follow up with Dr. Lake Bells in 6 months or sooner if needed

## 2018-08-06 NOTE — Assessment & Plan Note (Signed)
Seroquel, Xanax prn Info [id number] and court order  given

## 2018-08-06 NOTE — Assessment & Plan Note (Signed)
On B12 

## 2018-08-06 NOTE — Assessment & Plan Note (Signed)
Levothroid 
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
## 2018-08-06 NOTE — Assessment & Plan Note (Addendum)
Not smoking now! Pulm f/u today

## 2018-08-07 ENCOUNTER — Encounter: Payer: Self-pay | Admitting: Pulmonary Disease

## 2018-08-29 LAB — FECAL OCCULT BLOOD, IMMUNOCHEMICAL: IFOBT: NEGATIVE

## 2018-09-09 DIAGNOSIS — R0902 Hypoxemia: Secondary | ICD-10-CM | POA: Diagnosis not present

## 2018-09-09 DIAGNOSIS — G8929 Other chronic pain: Secondary | ICD-10-CM | POA: Diagnosis not present

## 2018-09-09 DIAGNOSIS — Z79899 Other long term (current) drug therapy: Secondary | ICD-10-CM | POA: Diagnosis not present

## 2018-09-09 DIAGNOSIS — E079 Disorder of thyroid, unspecified: Secondary | ICD-10-CM | POA: Diagnosis not present

## 2018-09-09 DIAGNOSIS — R05 Cough: Secondary | ICD-10-CM | POA: Diagnosis not present

## 2018-09-09 DIAGNOSIS — R52 Pain, unspecified: Secondary | ICD-10-CM | POA: Diagnosis not present

## 2018-09-09 DIAGNOSIS — R0689 Other abnormalities of breathing: Secondary | ICD-10-CM | POA: Diagnosis not present

## 2018-09-09 DIAGNOSIS — R079 Chest pain, unspecified: Secondary | ICD-10-CM | POA: Diagnosis not present

## 2018-09-11 ENCOUNTER — Ambulatory Visit: Payer: Self-pay | Admitting: *Deleted

## 2018-09-11 MED ORDER — AZITHROMYCIN 250 MG PO TABS: ORAL_TABLET | ORAL | 0 refills | Status: DC

## 2018-09-11 NOTE — Telephone Encounter (Signed)
Pt called in to speak with a nurse. Pt says that she was Dx with the flu & URI at the hospital [facility]). Pt is declining ov because she says that she was seen at the ED and was just seen by PCP. Pt says today she is having SOB and feels weak.   Patient was seen at the hospital 11/12. Patient was diagnosed with flu and URI. Patient was treated with cough medication- tessalon 200 mg, Toradol 10 mg. Patient states she was so dizzy she could not drive- she called EMS and they had to take her to [facility] because [facility] was full.   Patient is reporting a productive cough- she has phelm moving around.Patient wants antibiotic for her sinus and chest. Patient is eating potato soap and trying to push fluids. Patient had low potasium level-she was given medication at the hospital for that. Patient has had frequent diarrhea. Patient states at this point she can not drive. Patient reports she has no help- she is requesting an antibiotic.    Reason for Disposition . [1] HIGH RISK (e.g., age > 70 years, pregnant, HIV+, or chronic medical condition) AND [2]  > 72 hours (3 days) since evaluated by HCP AND [3] symptoms not improved  Answer Assessment - Initial Assessment Questions 1. DIAGNOSIS CONFIRMATION: "When was the influenza diagnosed?" "By whom?" "Did you get a test for it?"     [facility] 2. MEDICINES: "Were you prescribed any medications for the influenza?"  (e.g., zanamivir [Relenza], oseltamavir [Tamiflu]).      no 3. ONSET of SYMPTOMS: "When did your symptoms start?"     Over the week end- patient started coughing and she got worse 4. SYMPTOMS: "What symptoms are you most concerned about?" (e.g., runny nose, stuffy nose, sore throat, cough, breathing difficulty, fever)     Patient is not taking tylenol for fever 5. COUGH: "How bad is the cough?"     Patient is coughing- has SOB 6. FEVER: "Do you have a fever?" If so, ask: "What is your temperature, how  was it measured, and when did it start?"     99.6 today- ear  7. RESPIRATORY DISTRESS: "Are you having any trouble breathing?" If yes, ask: "Describe your breathing."      Patient is coughing 8. FLU VACCINE: "Did you receive a flu shot this year?" (e.g., seasonal influenza, H1N1)     yes 9. PREGNANCY: "Is there any chance you are pregnant?" "When was your last menstrual period?"     n/a 10. HIGH RISK for COMPLICATIONS: "Do you have any heart or lung problems? Do you have a weakened immune system?" (e.g., CHF, COPD, asthma, HIV positive, chemotherapy, renal failure, diabetes mellitus, sickle cell anemia) History of lung disease  Protocols used: INFLUENZA FOLLOW-UP CALL-A-AH

## 2018-09-11 NOTE — Telephone Encounter (Signed)
rx sent

## 2018-09-11 NOTE — Telephone Encounter (Signed)
Ok - pls call in a Zpac Thx

## 2018-09-11 NOTE — Telephone Encounter (Signed)
Please advise 

## 2018-09-16 ENCOUNTER — Other Ambulatory Visit: Payer: Self-pay | Admitting: Family Medicine

## 2018-09-19 ENCOUNTER — Ambulatory Visit: Payer: Self-pay

## 2018-09-19 NOTE — Telephone Encounter (Signed)
Pt. Finished her Z-pak 09/15/18. Still has a cough and sinus congestion. No fever today. Feels weak. Trying to stay hydrated. Requesting Dr. Alain Marion send another refill of her Z-pak. Refuses OV - "I can't get a ride to an appointment."  Please advise pt.  Answer Assessment - Initial Assessment Questions 1. DESCRIPTION: "Describe how you are feeling."     I feel weak 2. SEVERITY: "How bad is it?"  "Can you stand and walk?"   - MILD - Feels weak or tired, but does not interfere with work, school or normal activities   - [place] to stand and walk; weakness interferes with work, school, or normal activities   - SEVERE - Unable to stand or walk     Moderate 3. ONSET:  "When did the weakness begin?"     Last week 4. CAUSE: "What do you think is causing the weakness?"     The cough is making me weak 5. MEDICINES: "Have you recently started a new medicine or had a change in the amount of a medicine?"     Finished her Z-PAK  09/15/18 6. OTHER SYMPTOMS: "Do you have any other symptoms?" (e.g., chest pain, fever, cough, SOB, vomiting, diarrhea, bleeding, other areas of pain)     Cough,sinus congestion 7. PREGNANCY: "Is there any chance you are pregnant?" "When was your last menstrual period?"     No  Protocols used: WEAKNESS (GENERALIZED) AND FATIGUE-A-AH

## 2018-09-20 NOTE — Telephone Encounter (Signed)
Please advise 

## 2018-09-22 ENCOUNTER — Telehealth: Payer: Self-pay | Admitting: Internal Medicine

## 2018-09-22 ENCOUNTER — Other Ambulatory Visit: Payer: Self-pay | Admitting: Internal Medicine

## 2018-09-22 NOTE — Telephone Encounter (Signed)
Ok to ref Z pac OV if not better Thx

## 2018-09-22 NOTE — Telephone Encounter (Signed)
Pt notified I sent message sent this am

## 2018-09-22 NOTE — Telephone Encounter (Signed)
Patient called back today regarding nurse triage message from 09/19/2018. She would like a call back as soon as possible.

## 2018-09-23 NOTE — Telephone Encounter (Signed)
No VM, unable to reach pt, RX sent this AM

## 2018-09-24 ENCOUNTER — Other Ambulatory Visit: Payer: Self-pay | Admitting: Pulmonary Disease

## 2018-09-24 MED ORDER — ALBUTEROL SULFATE HFA 108 (90 BASE) MCG/ACT IN AERS: 2.0000 | INHALATION_SPRAY | Freq: Four times a day (QID) | RESPIRATORY_TRACT | 6 refills | Status: DC | PRN

## 2018-09-30 ENCOUNTER — Emergency Department (HOSPITAL_COMMUNITY): Payer: Medicare Other

## 2018-09-30 ENCOUNTER — Inpatient Hospital Stay (HOSPITAL_COMMUNITY)
Admission: EM | Admit: 2018-09-30 | Discharge: 2018-10-02 | DRG: 194 | Disposition: A | Discharge status: Home / Self Care | Payer: Medicare Other | Attending: Internal Medicine | Admitting: Internal Medicine

## 2018-09-30 ENCOUNTER — Other Ambulatory Visit: Payer: Self-pay

## 2018-09-30 ENCOUNTER — Ambulatory Visit: Payer: Self-pay

## 2018-09-30 ENCOUNTER — Encounter (HOSPITAL_COMMUNITY): Payer: Self-pay

## 2018-09-30 DIAGNOSIS — Z881 Allergy status to other antibiotic agents status: Secondary | ICD-10-CM

## 2018-09-30 DIAGNOSIS — R0602 Shortness of breath: Secondary | ICD-10-CM | POA: Diagnosis not present

## 2018-09-30 DIAGNOSIS — Z888 Allergy status to other drugs, medicaments and biological substances status: Secondary | ICD-10-CM | POA: Diagnosis not present

## 2018-09-30 DIAGNOSIS — Z8249 Family history of ischemic heart disease and other diseases of the circulatory system: Secondary | ICD-10-CM | POA: Diagnosis not present

## 2018-09-30 DIAGNOSIS — J189 Pneumonia, unspecified organism: Secondary | ICD-10-CM

## 2018-09-30 DIAGNOSIS — D696 Thrombocytopenia, unspecified: Secondary | ICD-10-CM | POA: Diagnosis not present

## 2018-09-30 DIAGNOSIS — R05 Cough: Secondary | ICD-10-CM | POA: Diagnosis not present

## 2018-09-30 DIAGNOSIS — J181 Lobar pneumonia, unspecified organism: Principal | ICD-10-CM | POA: Diagnosis present

## 2018-09-30 DIAGNOSIS — Z90710 Acquired absence of both cervix and uterus: Secondary | ICD-10-CM | POA: Diagnosis not present

## 2018-09-30 DIAGNOSIS — M797 Fibromyalgia: Secondary | ICD-10-CM | POA: Diagnosis present

## 2018-09-30 DIAGNOSIS — Z800 Family history of malignant neoplasm of digestive organs: Secondary | ICD-10-CM | POA: Diagnosis not present

## 2018-09-30 DIAGNOSIS — Z885 Allergy status to narcotic agent status: Secondary | ICD-10-CM

## 2018-09-30 DIAGNOSIS — R531 Weakness: Secondary | ICD-10-CM | POA: Diagnosis not present

## 2018-09-30 DIAGNOSIS — Z882 Allergy status to sulfonamides status: Secondary | ICD-10-CM | POA: Diagnosis not present

## 2018-09-30 DIAGNOSIS — Z79890 Hormone replacement therapy (postmenopausal): Secondary | ICD-10-CM

## 2018-09-30 DIAGNOSIS — E039 Hypothyroidism, unspecified: Secondary | ICD-10-CM | POA: Diagnosis not present

## 2018-09-30 DIAGNOSIS — E876 Hypokalemia: Secondary | ICD-10-CM | POA: Diagnosis present

## 2018-09-30 DIAGNOSIS — R069 Unspecified abnormalities of breathing: Secondary | ICD-10-CM | POA: Diagnosis not present

## 2018-09-30 DIAGNOSIS — R079 Chest pain, unspecified: Secondary | ICD-10-CM | POA: Diagnosis not present

## 2018-09-30 DIAGNOSIS — Z87891 Personal history of nicotine dependence: Secondary | ICD-10-CM | POA: Diagnosis not present

## 2018-09-30 DIAGNOSIS — Z803 Family history of malignant neoplasm of breast: Secondary | ICD-10-CM | POA: Diagnosis not present

## 2018-09-30 DIAGNOSIS — Z801 Family history of malignant neoplasm of trachea, bronchus and lung: Secondary | ICD-10-CM

## 2018-09-30 DIAGNOSIS — J440 Chronic obstructive pulmonary disease with acute lower respiratory infection: Secondary | ICD-10-CM | POA: Diagnosis not present

## 2018-09-30 LAB — COMPREHENSIVE METABOLIC PANEL
ALK PHOS: 69 U/L (ref 38–126)
ALT: 10 U/L (ref 0–44)
AST: 14 U/L — ABNORMAL LOW (ref 15–41)
Albumin: 4.2 g/dL (ref 3.5–5.0)
Anion gap: 8 (ref 5–15)
BILIRUBIN TOTAL: 1 mg/dL (ref 0.3–1.2)
BUN: 12 mg/dL (ref 8–23)
CALCIUM: 9.1 mg/dL (ref 8.9–10.3)
CO2: 24 mmol/L (ref 22–32)
CREATININE: 0.74 mg/dL (ref 0.44–1.00)
Chloride: 108 mmol/L (ref 98–111)
Glucose, Bld: 92 mg/dL (ref 70–99)
Potassium: 3 mmol/L — ABNORMAL LOW (ref 3.5–5.1)
Sodium: 140 mmol/L (ref 135–145)
TOTAL PROTEIN: 7.5 g/dL (ref 6.5–8.1)

## 2018-09-30 LAB — CBC WITH DIFFERENTIAL/PLATELET
Abs Immature Granulocytes: 0.03 10*3/uL (ref 0.00–0.07)
BASOS PCT: 0 %
Basophils Absolute: 0 10*3/uL (ref 0.0–0.1)
Eosinophils Absolute: 0 10*3/uL (ref 0.0–0.5)
Eosinophils Relative: 0 %
HEMATOCRIT: 40.2 % (ref 36.0–46.0)
HEMOGLOBIN: 13.1 g/dL (ref 12.0–15.0)
Immature Granulocytes: 0 %
LYMPHS ABS: 0.7 10*3/uL (ref 0.7–4.0)
Lymphocytes Relative: 6 %
MCH: 28.6 pg (ref 26.0–34.0)
MCHC: 32.6 g/dL (ref 30.0–36.0)
MCV: 87.8 fL (ref 80.0–100.0)
MONO ABS: 0.6 10*3/uL (ref 0.1–1.0)
MONOS PCT: 5 %
NEUTROS ABS: 9.9 10*3/uL — AB (ref 1.7–7.7)
Neutrophils Relative %: 89 %
PLATELETS: 156 10*3/uL (ref 150–400)
RBC: 4.58 MIL/uL (ref 3.87–5.11)
RDW: 12.6 % (ref 11.5–15.5)
WBC: 11.3 10*3/uL — ABNORMAL HIGH (ref 4.0–10.5)
nRBC: 0 % (ref 0.0–0.2)

## 2018-09-30 LAB — URINALYSIS, ROUTINE W REFLEX MICROSCOPIC
BILIRUBIN URINE: NEGATIVE
Bacteria, UA: NONE SEEN
Glucose, UA: NEGATIVE mg/dL
Ketones, ur: NEGATIVE mg/dL
Leukocytes, UA: NEGATIVE
NITRITE: NEGATIVE
PH: 6 (ref 5.0–8.0)
Protein, ur: NEGATIVE mg/dL
SPECIFIC GRAVITY, URINE: 1.016 (ref 1.005–1.030)

## 2018-09-30 LAB — MAGNESIUM: Magnesium: 2 mg/dL (ref 1.7–2.4)

## 2018-09-30 LAB — TROPONIN I: Troponin I: <0.03 ng/mL (ref <0.03)

## 2018-09-30 MED ORDER — POTASSIUM CHLORIDE CRYS ER 20 MEQ PO TBCR
40.0000 meq | EXTENDED_RELEASE_TABLET | ORAL | Status: AC
  Administered 2018-09-30 (×2): 40 meq via ORAL
  Filled 2018-09-30 (×2): qty 2

## 2018-09-30 MED ORDER — ENOXAPARIN SODIUM 40 MG/0.4ML ~~LOC~~ SOLN
40.0000 mg | SUBCUTANEOUS | Status: DC
  Administered 2018-09-30 – 2018-10-01 (×2): 40 mg via SUBCUTANEOUS
  Filled 2018-09-30 (×2): qty 0.4

## 2018-09-30 MED ORDER — VANCOMYCIN HCL 10 G IV SOLR
1250.0000 mg | Freq: Once | INTRAVENOUS | Status: AC
  Administered 2018-09-30: 1250 mg via INTRAVENOUS
  Filled 2018-09-30: qty 1250

## 2018-09-30 MED ORDER — SODIUM CHLORIDE 0.9 % IV SOLN
1.0000 g | Freq: Once | INTRAVENOUS | Status: AC
  Administered 2018-09-30: 1 g via INTRAVENOUS
  Filled 2018-09-30: qty 1

## 2018-09-30 MED ORDER — DM-GUAIFENESIN ER 30-600 MG PO TB12
1.0000 | ORAL_TABLET | Freq: Two times a day (BID) | ORAL | Status: DC
  Administered 2018-09-30 – 2018-10-02 (×4): 1 via ORAL
  Filled 2018-09-30 (×4): qty 1

## 2018-09-30 MED ORDER — ONDANSETRON HCL 4 MG PO TABS: 4.0000 mg | ORAL_TABLET | Freq: Four times a day (QID) | ORAL | Status: DC | PRN

## 2018-09-30 MED ORDER — KETOROLAC TROMETHAMINE 30 MG/ML IJ SOLN
15.0000 mg | Freq: Once | INTRAMUSCULAR | Status: AC
  Administered 2018-09-30: 15 mg via INTRAVENOUS
  Filled 2018-09-30: qty 1

## 2018-09-30 MED ORDER — BUDESONIDE 0.5 MG/2ML IN SUSP
0.5000 mg | Freq: Two times a day (BID) | RESPIRATORY_TRACT | Status: DC
  Administered 2018-09-30: 0.5 mg via RESPIRATORY_TRACT
  Filled 2018-09-30: qty 2

## 2018-09-30 MED ORDER — METHYLPREDNISOLONE SODIUM SUCC 125 MG IJ SOLR
125.0000 mg | INTRAMUSCULAR | Status: AC
  Administered 2018-09-30: 125 mg via INTRAVENOUS
  Filled 2018-09-30: qty 2

## 2018-09-30 MED ORDER — SODIUM CHLORIDE 0.9 % IV BOLUS
1000.0000 mL | Freq: Once | INTRAVENOUS | Status: AC
  Administered 2018-09-30: 1000 mL via INTRAVENOUS

## 2018-09-30 MED ORDER — MORPHINE SULFATE (PF) 2 MG/ML IV SOLN
2.0000 mg | Freq: Once | INTRAVENOUS | Status: AC
  Administered 2018-09-30: 2 mg via INTRAVENOUS
  Filled 2018-09-30: qty 1

## 2018-09-30 MED ORDER — SODIUM CHLORIDE 0.9 % IV SOLN
1.0000 g | Freq: Three times a day (TID) | INTRAVENOUS | Status: DC
  Administered 2018-09-30 – 2018-10-02 (×5): 1 g via INTRAVENOUS
  Filled 2018-09-30 (×10): qty 1

## 2018-09-30 MED ORDER — PANTOPRAZOLE SODIUM 40 MG PO TBEC
40.0000 mg | DELAYED_RELEASE_TABLET | Freq: Every day | ORAL | Status: DC
  Administered 2018-10-01 – 2018-10-02 (×2): 40 mg via ORAL
  Filled 2018-09-30 (×2): qty 1

## 2018-09-30 MED ORDER — FLUTICASONE FUROATE-VILANTEROL 100-25 MCG/INH IN AEPB
1.0000 | INHALATION_SPRAY | Freq: Every day | RESPIRATORY_TRACT | Status: DC
  Filled 2018-09-30: qty 28

## 2018-09-30 MED ORDER — ACETAMINOPHEN 650 MG RE SUPP: 650.0000 mg | Freq: Four times a day (QID) | RECTAL | Status: DC | PRN

## 2018-09-30 MED ORDER — METHYLPHENIDATE HCL 5 MG PO TABS: 20.0000 mg | ORAL_TABLET | Freq: Every day | ORAL | Status: DC

## 2018-09-30 MED ORDER — IPRATROPIUM-ALBUTEROL 0.5-2.5 (3) MG/3ML IN SOLN: 3.0000 mL | RESPIRATORY_TRACT | Status: DC | PRN

## 2018-09-30 MED ORDER — VANCOMYCIN HCL IN DEXTROSE 750-5 MG/150ML-% IV SOLN
750.0000 mg | Freq: Two times a day (BID) | INTRAVENOUS | Status: DC
  Filled 2018-09-30: qty 150

## 2018-09-30 MED ORDER — POLYETHYLENE GLYCOL 3350 17 G PO PACK: 17.0000 g | PACK | Freq: Every day | ORAL | Status: DC | PRN

## 2018-09-30 MED ORDER — POTASSIUM CHLORIDE IN NACL 40-0.9 MEQ/L-% IV SOLN
INTRAVENOUS | Status: AC
  Administered 2018-09-30: 100 mL/h via INTRAVENOUS

## 2018-09-30 MED ORDER — ALPRAZOLAM 0.5 MG PO TABS
0.5000 mg | ORAL_TABLET | Freq: Three times a day (TID) | ORAL | Status: DC | PRN
  Administered 2018-09-30 – 2018-10-02 (×4): 0.5 mg via ORAL
  Filled 2018-09-30 (×5): qty 1

## 2018-09-30 MED ORDER — FLUTICASONE PROPIONATE HFA 110 MCG/ACT IN AERO: 1.0000 | INHALATION_SPRAY | Freq: Two times a day (BID) | RESPIRATORY_TRACT | Status: DC

## 2018-09-30 MED ORDER — ONDANSETRON HCL 4 MG/2ML IJ SOLN
4.0000 mg | Freq: Four times a day (QID) | INTRAMUSCULAR | Status: DC | PRN
  Administered 2018-10-02: 4 mg via INTRAVENOUS
  Filled 2018-09-30: qty 2

## 2018-09-30 MED ORDER — ALBUTEROL SULFATE (2.5 MG/3ML) 0.083% IN NEBU
5.0000 mg | INHALATION_SOLUTION | Freq: Once | RESPIRATORY_TRACT | Status: AC
  Administered 2018-09-30: 5 mg via RESPIRATORY_TRACT
  Filled 2018-09-30: qty 6

## 2018-09-30 MED ORDER — FLUTICASONE FUROATE-VILANTEROL 100-25 MCG/INH IN AEPB
1.0000 | INHALATION_SPRAY | Freq: Every day | RESPIRATORY_TRACT | Status: DC
  Administered 2018-10-01 – 2018-10-02 (×2): 1 via RESPIRATORY_TRACT
  Filled 2018-09-30: qty 28

## 2018-09-30 MED ORDER — LEVOTHYROXINE SODIUM 50 MCG PO TABS
50.0000 ug | ORAL_TABLET | Freq: Every day | ORAL | Status: DC
  Administered 2018-10-01 – 2018-10-02 (×2): 50 ug via ORAL
  Filled 2018-09-30 (×2): qty 1

## 2018-09-30 MED ORDER — QUETIAPINE FUMARATE 25 MG PO TABS
25.0000 mg | ORAL_TABLET | Freq: Every day | ORAL | Status: DC
  Administered 2018-09-30 – 2018-10-01 (×2): 25 mg via ORAL
  Filled 2018-09-30 (×2): qty 1

## 2018-09-30 MED ORDER — ACETAMINOPHEN 325 MG PO TABS
650.0000 mg | ORAL_TABLET | Freq: Four times a day (QID) | ORAL | Status: DC | PRN
  Administered 2018-09-30 – 2018-10-02 (×2): 650 mg via ORAL
  Filled 2018-09-30 (×3): qty 2

## 2018-09-30 MED ORDER — VANCOMYCIN HCL IN DEXTROSE 750-5 MG/150ML-% IV SOLN
750.0000 mg | Freq: Two times a day (BID) | INTRAVENOUS | Status: DC
  Filled 2018-09-30 (×3): qty 150

## 2018-09-30 MED ORDER — SODIUM CHLORIDE 0.9 % IV SOLN
INTRAVENOUS | Status: AC
  Filled 2018-09-30: qty 1

## 2018-09-30 NOTE — ED Triage Notes (Signed)
Pt brought to ED today via [facility]. Pt states she has been sick for 3 weeks. Pt states she has a production cough and congestion for 3 weeks. Pt states she feels weak. Pt was seen at [facility] on 09/07/18 for URI, Flu and Sinus infection.

## 2018-09-30 NOTE — Telephone Encounter (Signed)
Pt at ED.

## 2018-09-30 NOTE — ED Provider Notes (Signed)
[facility] EMERGENCY DEPARTMENT Provider Note   CSN: [id number] Arrival date & time: 09/30/18  1320     History   Chief Complaint Chief Complaint  Patient presents with  . Generalized Weakness    HPI Melody [name] is a 62 y.o. female.  HPI Patient presents with concern of ongoing cough, fever, weakness. She notes that she is a former smoker, with a 35-pack-year history of cigarette use. She states that she was generally well until about 3 weeks ago. At that time, she developed URI-like illness, and was seen at another facility. She was diagnosed with influenza via a positive swab, and URI, started on azithromycin. Since that time she is completed 2 courses of azithromycin, but has persistent cough, weakness, wheezing. She complains of generalized weakness without focality, denies confusion, acknowledges fever, headache as well. No relief with OTC medication, and as above, she is completed 2 courses of antibiotics without relief. Today, with worsening weakness, she called her physician and was referred here for evaluation. Past Medical History:  Diagnosis Date  . Anxiety   . Bronchiectasis ([place])   . Chronic back pain   . Chronic neck pain   . Depression    bipolar- Dr [name]  . Fatigue   . Fibromyalgia   . GERD (gastroesophageal reflux disease)   . Headache   . Hypothyroidism   . Low back pain    Dr Arnoldo Morale  . 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([place])   . Osteoarthritis   . Pneumonia   . Pulmonary nodule   . Sinus congestion   . Thyroid disease    hypothyroidsm  . Vertigo   . Vitamin B12 deficiency     Patient Active Problem List   Diagnosis Date Noted  . Meteorism 05/16/2018  . Emotional stress 12/25/2017  . 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([place]) 10/16/2017  . Bursitis 05/21/2017  . Knee pain, chronic 05/21/2017  . Cough 04/04/2017  . Multiple lung nodules on CT 04/04/2017  . Change in voice 04/04/2017  . Fatigue 02/08/2016  . Complete  uterovaginal prolapse 01/10/2016  . Status post total abdominal hysterectomy and bilateral salpingo-oophorectomy 01/10/2016  . Urinary incontinence 11/09/2015  . Vaginal discomfort 08/10/2015  . Rectocele 08/10/2015  . Well adult exam 09/03/2014  . Chronic obstructive airway disease with asthma ([place]) 09/03/2014  . UTI (urinary tract infection) 06/09/2014  . Dysplastic toenail 09/02/2013  . Aphthous ulcer 06/11/2013  . Dark urine 03/18/2013  . Rash 06/08/2012  . Contact dermatitis 04/13/2011  . PRURITUS 12/01/2010  . EXPOSURE TO HAZARDOUS BODY FLUID, HX OF 05/29/2010  . HIP PAIN 12/05/2009  . RUQ PAIN 02/04/2009  . WRIST PAIN 05/24/2008  . CERVICAL STRAIN 05/24/2008  . WARTS, VIRAL, UNSPECIFIED 04/06/2008  . NEOPLASM, SKIN, UNCERTAIN BEHAVIOR 34/19/6222  . Vitamin D deficiency 11/13/2007  . Hypothyroidism 08/05/2007  . Bipolar I disorder ([place]) 08/05/2007  . GERD 08/05/2007  . Anxiety state 07/23/2007  . Lumbar pain 07/23/2007  . Myalgia and myositis 07/23/2007  . B12 deficiency 05/22/2007  . IRON DEFICIENCY 05/22/2007    Past Surgical History:  Procedure Laterality Date  . ABDOMINAL HYSTERECTOMY N/A 01/10/2016   Procedure: HYSTERECTOMY ABDOMINAL;  Surgeon: Nunzio Cobbs, MD;  Location: [facility];  Service: Gynecology;  Laterality: N/A;  . ABDOMINAL SACROCOLPOPEXY N/A 01/10/2016   Procedure: ABDOMINO SACROCOLPOPEXY ;  Surgeon: Nunzio Cobbs, MD;  Location: [facility];  Service: Gynecology;  Laterality: N/A;  . ANTERIOR AND POSTERIOR REPAIR N/A 01/10/2016   Procedure:  POSTERIOR REPAIR (RECTOCELE);  Surgeon: Nunzio Cobbs, MD;  Location: [facility];  Service: Gynecology;  Laterality: N/A;  . BLADDER SUSPENSION N/A 01/10/2016   Procedure: TRANSVAGINAL TAPE (TVT) PROCEDURE exact midurethral sling;  Surgeon: Nunzio Cobbs, MD;  Location: [facility];  Service: Gynecology;  Laterality: N/A;  . CERVICAL LAMINECTOMY  2001 and 2995   Dr Arnoldo Morale  . CYSTO N/A  01/10/2016   Procedure: CYSTOSCOPY;  Surgeon: Nunzio Cobbs, MD;  Location: [facility];  Service: Gynecology;  Laterality: N/A;  . SALPINGOOPHORECTOMY Bilateral 01/10/2016   Procedure: BILATERAL SALPINGO OOPHORECTOMY;  Surgeon: Nunzio Cobbs, MD;  Location: [facility];  Service: Gynecology;  Laterality: Bilateral;  . TUBAL LIGATION  1980     OB History    Gravida  2   Para  2   Term  2   Preterm      AB      Living  2     SAB      TAB      Ectopic      Multiple      Live Births               Home Medications    Prior to Admission medications   Medication Sig Start Date End Date Taking? Authorizing Provider  albuterol (PROVENTIL HFA;VENTOLIN HFA) 108 (90 Base) MCG/ACT inhaler Inhale 2 puffs into the lungs every 6 (six) hours as needed for wheezing or shortness of breath. 09/24/18   Juanito Doom, MD  albuterol (PROVENTIL) (2.5 MG/3ML) 0.083% nebulizer solution Take 3 mLs (2.5 mg total) by nebulization every 6 (six) hours as needed for wheezing or shortness of breath. 01/30/18   Juanito Doom, MD  ALPRAZolam Duanne Moron) 1 MG tablet Take 1 mg by mouth 4 (four) times daily.     [provider]  Azelastine-Fluticasone (DYMISTA) 137-50 MCG/ACT SUSP Place 1 spray 2 (two) times daily into the nose. 09/12/17   Javier Glazier, MD  azithromycin (ZITHROMAX) 250 MG tablet TAKE 2 TABLETS BY MOUTH ON DAY 1, THEN TAKE 1 TABLET DAILY ON DAYS 2-5 09/23/18   Plotnikov, Evie Lacks, MD  BREO ELLIPTA 100-25 MCG/INH AEPB inhale ONE PUFF into THE lungs DAILY 01/20/18   Plotnikov, Evie Lacks, MD  cephALEXin (KEFLEX) 500 MG capsule Take 1 capsule (500 mg total) by mouth 4 (four) times daily. 06/12/18   Triplett, Tammy, PA-C  cholecalciferol (VITAMIN D) 1000 units tablet Take 1,000 Units by mouth once a week.    [provider]  cyanocobalamin (,VITAMIN B-12,) 1000 MCG/ML injection INJECT 1 ML INTO THE MUSCLE EVERY 14 DAYS. 07/21/18   Plotnikov, Evie Lacks, MD    fluticasone (FLONASE) 50 MCG/ACT nasal spray USE 2 SPRAYS IN BOTH NOSTRILS DAILY. 02/28/18   Plotnikov, Evie Lacks, MD  lamoTRIgine (LAMICTAL) 25 MG tablet Take 25 mg by mouth daily. 03/10/18   [provider]  levocetirizine (XYZAL) 5 MG tablet TAKE 1 TABLET BY MOUTH EVERY EVENING. 06/26/18   Plotnikov, Evie Lacks, MD  levothyroxine (SYNTHROID, LEVOTHROID) 50 MCG tablet TAKE ONE TABLET BY MOUTH DAILY BEFORE breakfast 02/11/18   Plotnikov, Evie Lacks, MD  methylphenidate (RITALIN) 20 MG tablet TAKE ONE TABLET BY MOUTH EVERY MORNING AND AT NOON 10/03/17   [provider]  methylPREDNISolone (MEDROL DOSEPAK) 4 MG TBPK tablet As directed 05/11/18   Plotnikov, Evie Lacks, MD  metroNIDAZOLE (FLAGYL) 500 MG tablet Take 1 tablet (500 mg total) by  mouth 3 (three) times daily. 05/16/18   Plotnikov, Evie Lacks, MD  oxyCODONE-acetaminophen (PERCOCET/ROXICET) 5-325 MG tablet Take 1 tablet by mouth 2 (two) times daily as needed for severe pain. Fill on or after 10/08/18 08/06/18   Plotnikov, Evie Lacks, MD  oxyCODONE-acetaminophen (PERCOCET/ROXICET) 5-325 MG tablet Take 1 tablet by mouth 2 (two) times daily as needed for severe pain. Fill on or after 09/08/18 08/06/18   Plotnikov, Evie Lacks, MD  oxyCODONE-acetaminophen (PERCOCET/ROXICET) 5-325 MG tablet Take 1 tablet by mouth 2 (two) times daily as needed for severe pain. Fill on or after 08/09/18 08/06/18   Plotnikov, Evie Lacks, MD  pantoprazole (PROTONIX) 40 MG tablet TAKE 1 TABLET BY MOUTH DAILY 06/26/18   Plotnikov, Evie Lacks, MD  Probiotic Product (ALIGN) 4 MG CAPS Take 1 capsule (4 mg total) by mouth daily. Start after you finish Flagyl 05/16/18   Plotnikov, Evie Lacks, MD  QUEtiapine (SEROQUEL) 25 MG tablet Take 25 mg by mouth at bedtime.     [provider]  Respiratory Therapy Supplies (FLUTTER) DEVI Use as directed 05/21/17   Javier Glazier, MD  SUMAtriptan (IMITREX) 100 MG tablet Take 1 tablet (100 mg total) by mouth every 2 (two) hours as  needed for migraine. May repeat in 2 hours if headache persists or recurs. 02/11/18   Plotnikov, Evie Lacks, MD  triamcinolone ointment (KENALOG) 0.1 % Apply 1 application topically 2 (two) times daily. 05/11/18   Plotnikov, Evie Lacks, MD  Vitamin D, Ergocalciferol, (DRISDOL) 1.25 MG (50000 UT) CAPS capsule TAKE ONE CAPSULE BY MOUTH EVERY SEVEN DAYS 09/16/18   Lyndal Pulley, DO    Family History Family History  Problem Relation Age of Onset  . Heart attack Father        dec heart disease age 2  . Hypertension Father   . Lung disease Father   . Cancer Mother        dec stomach ca--age 50  . Breast cancer Cousin   . Arthritis Other   . Breast cancer Paternal Aunt     Social History Social History   Tobacco Use  . Smoking status: Former Smoker    Packs/day: 1.00    Years: 35.00    Pack years: 35.00    Types: Cigarettes    Start date: 11/21/1962    Last attempt to quit: 05/06/2011    Years since quitting: 7.4  . Smokeless tobacco: Never Used  . Tobacco comment: stopped periodically - peak rate of 1 ppd  Substance Use Topics  . Alcohol use: No    Alcohol/week: 0.0 standard drinks  . Drug use: No     Allergies   Doxycycline hyclate; Flagyl [metronidazole]; Gabapentin; Nabumetone; Sulfa antibiotics; Tetracycline hcl; and Tramadol hcl   Review of Systems Review of Systems  Constitutional:       Per HPI, otherwise negative  HENT:       Per HPI, otherwise negative  Respiratory:       Per HPI, otherwise negative  Cardiovascular:       Per HPI, otherwise negative  Gastrointestinal: Positive for nausea. Negative for vomiting.  Endocrine:       Negative aside from HPI  Genitourinary:       Neg aside from HPI   Musculoskeletal:       Per HPI, otherwise negative  Skin: Negative.   Neurological: Positive for weakness and headaches. Negative for syncope.     Physical Exam Updated Vital Signs BP 130/65 (BP Location: Right Arm)  Pulse 95   Temp (!) 100.9 F (38.3 C)  (Oral)   Resp 18   Ht 5\' 6"  (1.676 m)   Wt 61.7 kg   LMP 10/29/2006 (Approximate)   SpO2 98%   BMI 21.95 kg/m   Physical Exam  Constitutional: She is oriented to person, place, and time. She has a sickly appearance.  HENT:  Head: Normocephalic and atraumatic.  Eyes: Conjunctivae and EOM are normal.  Cardiovascular: Normal rate and regular rhythm.  Pulmonary/Chest: She has decreased breath sounds.  Abdominal: She exhibits no distension.  Musculoskeletal: She exhibits no edema.  Neurological: She is alert and oriented to person, place, and time. No cranial nerve deficit.  Skin: Skin is warm and dry.  Psychiatric: She has a normal mood and affect.  Nursing note and vitals reviewed.    ED Treatments / Results  Labs (all labs ordered are listed, but only abnormal results are displayed) Labs Reviewed  CBC WITH DIFFERENTIAL/PLATELET - Abnormal; Notable for the following components:      Result Value   WBC 11.3 (*)    Neutro Abs 9.9 (*)    All other components within normal limits  COMPREHENSIVE METABOLIC PANEL - Abnormal; Notable for the following components:   Potassium 3.0 (*)    AST 14 (*)    All other components within normal limits  URINALYSIS, ROUTINE W REFLEX MICROSCOPIC - Abnormal; Notable for the following components:   Hgb urine dipstick MODERATE (*)    All other components within normal limits    Radiology Dg Chest 2 View  Result Date: 09/30/2018 CLINICAL DATA:  Chest pain and cough. EXAM: CHEST - 2 VIEW COMPARISON:  September 09, 2018 FINDINGS: Patchy opacities in the lungs, right greater than left, particularly towards the right base. These findings are worse when compared to the chest x-ray from September 09, 2018. The heart, hila, mediastinum, lungs, and pleura are otherwise unremarkable. IMPRESSION: Patchy bilateral pulmonary opacities, right greater than left. The findings are worsened compared to the November 2019 study and are most consistent with an infectious  process. Recommend short-term follow-up to ensure resolution. Electronically Signed   By: Dorise Bullion III M.D   On: 09/30/2018 14:54    Procedures Procedures (including critical care time)  Medications Ordered in ED Medications  sodium chloride 0.9 % bolus 1,000 mL (1,000 mLs Intravenous New Bag/Given 09/30/18 1502)  albuterol (PROVENTIL) (2.5 MG/3ML) 0.083% nebulizer solution 5 mg (has no administration in time range)  ceFEPIme (MAXIPIME) 1 g in sodium chloride 0.9 % 100 mL IVPB (has no administration in time range)  ketorolac (TORADOL) 30 MG/ML injection 15 mg (15 mg Intravenous Given 09/30/18 1456)  methylPREDNISolone sodium succinate (SOLU-MEDROL) 125 mg/2 mL injection 125 mg (125 mg Intravenous Given 09/30/18 1455)     Initial Impression / Assessment and Plan / ED Course  I have reviewed the triage vital signs and the nursing notes.  Pertinent labs & imaging results that were available during my care of the patient were reviewed by me and considered in my medical decision making (see chart for details).     3:14 PM On repeat exam the patient is awake and alert. She is receiving IV fluids, has had minimal change in her condition. I reviewed the x-ray, and then discussed it with her. With bilateral pulmonary opacities, suspicion for persistent pneumonia, typically given the patient's fever, leukocytosis. Patient has completed 2 courses of azithromycin, has intolerance to doxycycline, will receive broad-spectrum antibiotic coverage.  With history of smoking,  though no formal diagnosis of COPD, there is some suspicion for obstructive disease, particular after reviewing patient's x-ray.  When she has received Solu-Medrol, and is receiving albuterol bronchodilator as well.   Final Clinical Impressions(s) / ED Diagnoses   Final diagnoses:  Pneumonia of both lower lobes due to infectious organism [facility])     Carmin Muskrat, MD 09/30/18 [phone]

## 2018-09-30 NOTE — Progress Notes (Signed)
Late Entry: Patient refused to give home medications and narcotics to primary nurse. Patient educated on safety precautions regarding home medications at bedside. Patient stated "Im 62 years old and I know what Im taking. Im not giving you anything." Charge nurse, Victorino Sparrow RN, informed and spoke with patient. Medications have been collected and sent to pharmacy.

## 2018-09-30 NOTE — Telephone Encounter (Signed)
Nurse spoke with Me not Gareth Eagle

## 2018-09-30 NOTE — Progress Notes (Signed)
Pharmacy Antibiotic Note  Melody [name] is a 62 y.o. female admitted on 09/30/2018 with pneumonia.  Pharmacy has been consulted for Vancomycin and Cefepime dosing.  Plan: Vancomycin 1250mg  loading dose, then 750mg  IV every 12 hours.  Goal trough 15-20 mcg/mL.  Cefepime 1gm IV q8h F/U cxs and clinical progress Monitor V/S, labs and levels as indicated  Height: 5\' 6"  (167.6 cm) Weight: 136 lb (61.7 kg) IBW/kg (Calculated) : 59.3  Temp (24hrs), Avg:99.6 F (37.6 C), Min:98.2 F (36.8 C), Max:100.9 F (38.3 C)  Recent Labs  Lab 09/30/18 1406  WBC 11.3*  CREATININE 0.74    Estimated Creatinine Clearance: 67.4 mL/min (by C-G formula based on SCr of 0.74 mg/dL).    Allergies  Allergen Reactions  . Flagyl [Metronidazole] Shortness Of Breath    Made tongue turn dark red, was not able to breath.  . Doxycycline Hyclate Itching  . Gabapentin     Headaches: Patient is not aware of the reaction to this medication  . Nabumetone Other (See Comments)    Headache  . Sulfa Antibiotics Other (See Comments)    Reaction is unknown  . Tetracycline Hcl Itching  . Tramadol Hcl Other (See Comments)    Reaction is unknown    Antimicrobials this admission: Vancomycin 12/3 >> Cefepime 12/3 >>   Dose adjustments this admission: N/A  Microbiology results: 12/3 BCx: pending  MRSA PCR:   Thank you for allowing pharmacy to be a part of this patient's care.  Isac Sarna, BS [place], [place] Clinical Pharmacist Pager [phone] 09/30/2018 5:14 PM

## 2018-09-30 NOTE — Progress Notes (Signed)
Pharmacy Note:  Initial antibiotic(s) regimen of Vancomycin and Cefepime ordered by EDP to treat PNA.  Estimated Creatinine Clearance: 67.4 mL/min (by C-G formula based on SCr of 0.74 mg/dL).   Allergies  Allergen Reactions  . Doxycycline Hyclate Itching  . Flagyl [Metronidazole]     Made tongue turn dark red, was not able to breath.  . Gabapentin     HAs  . Nabumetone Other (See Comments)    Headache  . Sulfa Antibiotics Other (See Comments)    Reaction is unknown  . Tetracycline Hcl Itching  . Tramadol Hcl Other (See Comments)    Reaction is unknown    Vitals:   09/30/18 1344  BP: 130/65  Pulse: 95  Resp: 18  Temp: (!) 100.9 F (38.3 C)  SpO2: 98%    Anti-infectives (From admission, onward)   Start     Dose/Rate Route Frequency Ordered Stop   10/01/18 0400  vancomycin (VANCOCIN) IVPB 750 mg/150 ml premix  Status:  Discontinued     750 mg 150 mL/hr over 60 Minutes Intravenous Every 12 hours 09/30/18 1527 09/30/18 1541   09/30/18 1600  vancomycin (VANCOCIN) 1,250 mg in sodium chloride 0.9 % 250 mL IVPB     1,250 mg 166.7 mL/hr over 90 Minutes Intravenous  Once 09/30/18 1527     09/30/18 1515  ceFEPIme (MAXIPIME) 1 g in sodium chloride 0.9 % 100 mL IVPB     1 g 200 mL/hr over 30 Minutes Intravenous  Once 09/30/18 1512        Plan: Initial dose(s) of Vancomycin 1250mg  IV and Cefepime 1gm IV X 1 ordered. F/U admission orders for further dosing if therapy continued.  Cristy Friedlander, [facility] 09/30/2018 3:42 PM

## 2018-09-30 NOTE — H&P (Signed)
History and Physical    Melody [name]:[id number] DOB: 12-05-1955 DOA: 09/30/2018  PCP: Cassandria Anger, MD   Patient coming from: Home  Chief Complaint: SOB, Cough  HPI: Melody [name] is a 62 y.o. female with medical history significant for thyroidism, COPD, BPD, Mycobacterium avium intra-cellulare, who presented to the ED with complaints of generalized weakness shortness of breath with exertion and cough productive of pinkish sputum over the past month.  With increasing weakness, subjective fevers and chills.  Recent diagnosis of flu at another facility- 11/10. Patient has completed 2 courses of azithromycin over the past month, still with persistence of symptoms.  No recent admissions in the past 3 months. And also complained of pain over the past few days to her upper epigastric/lower sternal area, described as a " beating", related to activity.  Father had a heart attack but he was in his 26s.  No personal or family history of blood clots.  Chest pain without associated palpitations nausea vomiting or dizziness.  Nonradiating.  No dysuria frequency, no vomiting no loose stools no abdominal pain. She reports a headache without neck pain.  ED Course: 100.9.  O2 sats greater than 95% on room air.  CBC -11.3.  Two-view chest x-ray patchy bilateral pulmonary opacities right greater than left, findings worsened compared to November 2019 study, most consistent with an infectious process.  Blood cultures were obtained patient was started on broad-spectrum antibiotics IV vancomycin and cefepime.  Review of Systems: As per HPI all other systems reviewed and are negative.  Past Medical History:  Diagnosis Date  . Anxiety   . Bronchiectasis ([place])   . Chronic back pain   . Chronic neck pain   . Depression    bipolar- Dr [name]  . Fatigue   . Fibromyalgia   . GERD (gastroesophageal reflux disease)   . Headache   . Hypothyroidism   . Low back pain    Dr Arnoldo Morale  . MAI (mycobacterium  avium-intracellulare) ([place])   . Osteoarthritis   . Pneumonia   . Pulmonary nodule   . Sinus congestion   . Thyroid disease    hypothyroidsm  . Vertigo   . Vitamin B12 deficiency     Past Surgical History:  Procedure Laterality Date  . ABDOMINAL HYSTERECTOMY N/A 01/10/2016   Procedure: HYSTERECTOMY ABDOMINAL;  Surgeon: Nunzio Cobbs, MD;  Location: [facility];  Service: Gynecology;  Laterality: N/A;  . ABDOMINAL SACROCOLPOPEXY N/A 01/10/2016   Procedure: ABDOMINO SACROCOLPOPEXY ;  Surgeon: Nunzio Cobbs, MD;  Location: [facility];  Service: Gynecology;  Laterality: N/A;  . ANTERIOR AND POSTERIOR REPAIR N/A 01/10/2016   Procedure:  POSTERIOR REPAIR (RECTOCELE);  Surgeon: Nunzio Cobbs, MD;  Location: [facility];  Service: Gynecology;  Laterality: N/A;  . BLADDER SUSPENSION N/A 01/10/2016   Procedure: TRANSVAGINAL TAPE (TVT) PROCEDURE exact midurethral sling;  Surgeon: Nunzio Cobbs, MD;  Location: [facility];  Service: Gynecology;  Laterality: N/A;  . CERVICAL LAMINECTOMY  2001 and 2995   Dr Arnoldo Morale  . CYSTO N/A 01/10/2016   Procedure: CYSTOSCOPY;  Surgeon: Nunzio Cobbs, MD;  Location: [facility];  Service: Gynecology;  Laterality: N/A;  . SALPINGOOPHORECTOMY Bilateral 01/10/2016   Procedure: BILATERAL SALPINGO OOPHORECTOMY;  Surgeon: Nunzio Cobbs, MD;  Location: [facility];  Service: Gynecology;  Laterality: Bilateral;  . TUBAL LIGATION  1980     reports that she quit smoking about 7  years ago. Her smoking use included cigarettes. She started smoking about 55 years ago. She has a 35.00 pack-year smoking history. She has never used smokeless tobacco. She reports that she does not drink alcohol or use drugs.  Allergies  Allergen Reactions  . Flagyl [Metronidazole] Shortness Of Breath    Made tongue turn dark red, was not able to breath.  . Doxycycline Hyclate Itching  . Gabapentin     Headaches: Patient is not aware of the reaction to this  medication  . Nabumetone Other (See Comments)    Headache  . Sulfa Antibiotics Other (See Comments)    Reaction is unknown  . Tetracycline Hcl Itching  . Tramadol Hcl Other (See Comments)    Reaction is unknown    Family History  Problem Relation Age of Onset  . Heart attack Father        dec heart disease age 45  . Hypertension Father   . Lung disease Father   . Cancer Mother        dec stomach ca--age 92  . Breast cancer Cousin   . Arthritis Other   . Breast cancer Paternal Aunt     Prior to Admission medications   Medication Sig Start Date End Date Taking? Authorizing Provider  albuterol (PROVENTIL HFA;VENTOLIN HFA) 108 (90 Base) MCG/ACT inhaler Inhale 2 puffs into the lungs every 6 (six) hours as needed for wheezing or shortness of breath. 09/24/18  Yes Juanito Doom, MD  ALPRAZolam Duanne Moron) 1 MG tablet Take 1 mg by mouth 4 (four) times daily.    Yes [provider]  benzonatate (TESSALON) 200 MG capsule Take 200 mg by mouth every 8 (eight) hours as needed for cough.  09/09/18  Yes [provider]  BREO ELLIPTA 100-25 MCG/INH AEPB inhale ONE PUFF into THE lungs DAILY 01/20/18  Yes Plotnikov, Evie Lacks, MD  cyanocobalamin (,VITAMIN B-12,) 1000 MCG/ML injection INJECT 1 ML INTO THE MUSCLE EVERY 14 DAYS. Patient taking differently: 1,000 mcg every 14 (fourteen) days.  07/21/18  Yes Plotnikov, Evie Lacks, MD  fluticasone (FLONASE) 50 MCG/ACT nasal spray USE 2 SPRAYS IN BOTH NOSTRILS DAILY. Patient taking differently: Place 2 sprays into both nostrils daily.  02/28/18  Yes Plotnikov, Evie Lacks, MD  ketorolac (TORADOL) 10 MG tablet Take 10 mg by mouth every 6 (six) hours as needed. for pain 09/09/18  Yes [provider]  levocetirizine (XYZAL) 5 MG tablet TAKE 1 TABLET BY MOUTH EVERY EVENING. Patient taking differently: Take 5 mg by mouth every evening.  06/26/18  Yes Plotnikov, Evie Lacks, MD  levothyroxine (SYNTHROID, LEVOTHROID) 50 MCG tablet TAKE ONE  TABLET BY MOUTH DAILY BEFORE breakfast Patient taking differently: Take 50 mcg by mouth daily before breakfast.  02/11/18  Yes Plotnikov, Evie Lacks, MD  methylphenidate (RITALIN) 20 MG tablet Take 20 mg by mouth 2 (two) times daily.  10/03/17  Yes [provider]  oxyCODONE-acetaminophen (PERCOCET/ROXICET) 5-325 MG tablet Take 1 tablet by mouth 2 (two) times daily as needed for severe pain. Fill on or after 10/08/18 08/06/18  Yes Plotnikov, Evie Lacks, MD  pantoprazole (PROTONIX) 40 MG tablet TAKE 1 TABLET BY MOUTH DAILY Patient taking differently: Take 40 mg by mouth daily.  06/26/18  Yes Plotnikov, Evie Lacks, MD  QUEtiapine (SEROQUEL) 25 MG tablet Take 25 mg by mouth at bedtime.    Yes [provider]  triamcinolone ointment (KENALOG) 0.1 % Apply 1 application topically 2 (two) times daily. Patient taking differently: Apply 1 application  topically 2 (two) times daily as needed.  05/11/18  Yes Plotnikov, Evie Lacks, MD  Vitamin D, Ergocalciferol, (DRISDOL) 1.25 MG (50000 UT) CAPS capsule TAKE ONE CAPSULE BY MOUTH EVERY SEVEN DAYS Patient taking differently: Take 50,000 Units by mouth every 7 (seven) days.  09/16/18  Yes Hulan Saas M, DO  albuterol (PROVENTIL) (2.5 MG/3ML) 0.083% nebulizer solution Take 3 mLs (2.5 mg total) by nebulization every 6 (six) hours as needed for wheezing or shortness of breath. Patient not taking: Reported on 09/30/2018 01/30/18   Juanito Doom, MD  Azelastine-Fluticasone [facility]) 137-50 MCG/ACT SUSP Place 1 spray 2 (two) times daily into the nose. 09/12/17   Javier Glazier, MD  azithromycin (ZITHROMAX) 250 MG tablet TAKE 2 TABLETS BY MOUTH ON DAY 1, THEN TAKE 1 TABLET DAILY ON DAYS 2-5 Patient not taking: Reported on 09/30/2018 09/23/18   Plotnikov, Evie Lacks, MD  cephALEXin (KEFLEX) 500 MG capsule Take 1 capsule (500 mg total) by mouth 4 (four) times daily. Patient not taking: Reported on 09/30/2018 06/12/18   Kem Parkinson, PA-C    oxyCODONE-acetaminophen (PERCOCET/ROXICET) 5-325 MG tablet Take 1 tablet by mouth 2 (two) times daily as needed for severe pain. Fill on or after 09/08/18 08/06/18   Plotnikov, Evie Lacks, MD  oxyCODONE-acetaminophen (PERCOCET/ROXICET) 5-325 MG tablet Take 1 tablet by mouth 2 (two) times daily as needed for severe pain. Fill on or after 08/09/18 08/06/18   Plotnikov, Evie Lacks, MD  Probiotic Product (ALIGN) 4 MG CAPS Take 1 capsule (4 mg total) by mouth daily. Start after you finish Flagyl Patient not taking: Reported on 09/30/2018 05/16/18   Plotnikov, Evie Lacks, MD  Respiratory Therapy Supplies (FLUTTER) DEVI Use as directed 05/21/17   Javier Glazier, MD  SUMAtriptan (IMITREX) 100 MG tablet Take 1 tablet (100 mg total) by mouth every 2 (two) hours as needed for migraine. May repeat in 2 hours if headache persists or recurs. Patient not taking: Reported on 09/30/2018 02/11/18   Cassandria Anger, MD    Physical Exam: Vitals:   09/30/18 1345 09/30/18 1430 09/30/18 1602 09/30/18 1615  BP:  (!) 144/74  131/69  Pulse:  87  88  Resp:    15  Temp:    98.2 F (36.8 C)  TempSrc:    Oral  SpO2:  97% 96% 98%  Weight: 61.7 kg     Height: _0  (1.676 m)       Constitutional: NAD, calm, comfortable Vitals:   09/30/18 1345 09/30/18 1430 09/30/18 1602 09/30/18 1615  BP:  (!) 144/74  131/69  Pulse:  87  88  Resp:    15  Temp:    98.2 F (36.8 C)  TempSrc:    Oral  SpO2:  97% 96% 98%  Weight: 61.7 kg     Height: _1  (1.676 m)      Eyes: PERRL, lids and conjunctivae normal ENMT: Mucous membranes are moist. Posterior pharynx clear of any exudate or lesions.Normal dentition.  Neck: normal, supple, no masses, no thyromegaly Respiratory: clear to auscultation bilaterally, no wheezing, no crackles. Normal respiratory effort. No accessory muscle use.  Cardiovascular: Regular rate and rhythm, no murmurs / rubs / gallops. No extremity edema. 2+ pedal pulses. No carotid bruits.  Abdomen: no  tenderness, no masses palpated. No hepatosplenomegaly. Bowel sounds positive.  Musculoskeletal: no clubbing / cyanosis. No joint deformity upper and lower extremities. Good ROM, no contractures. Normal muscle tone.  Skin: no rashes, lesions, ulcers. No induration Neurologic: CN  2-12 grossly intact.  Strength 5/5 in all 4.  Psychiatric: Normal judgment and insight. Alert and oriented x 3. Normal mood.   Labs on Admission: I have personally reviewed following labs and imaging studies  CBC: Recent Labs  Lab 09/30/18 1406  WBC 11.3*  NEUTROABS 9.9*  HGB 13.1  HCT 40.2  MCV 87.8  PLT 277   Basic Metabolic Panel: Recent Labs  Lab 09/30/18 1406  NA 140  K 3.0*  CL 108  CO2 24  GLUCOSE 92  BUN 12  CREATININE 0.74  CALCIUM 9.1   Liver Function Tests: Recent Labs  Lab 09/30/18 1406  AST 14*  ALT 10  ALKPHOS 69  BILITOT 1.0  PROT 7.5  ALBUMIN 4.2   Urine analysis:    Component Value Date/Time   COLORURINE YELLOW 09/30/2018 1424   APPEARANCEUR CLEAR 09/30/2018 1424   LABSPEC 1.016 09/30/2018 1424   PHURINE 6.0 09/30/2018 1424   GLUCOSEU NEGATIVE 09/30/2018 1424   GLUCOSEU NEGATIVE 05/16/2018 1454   HGBUR MODERATE (A) 09/30/2018 1424   BILIRUBINUR NEGATIVE 09/30/2018 1424   BILIRUBINUR n 04/27/2016 1513   KETONESUR NEGATIVE 09/30/2018 1424   PROTEINUR NEGATIVE 09/30/2018 1424   UROBILINOGEN 0.2 05/16/2018 1454   NITRITE NEGATIVE 09/30/2018 1424   LEUKOCYTESUR NEGATIVE 09/30/2018 1424    Radiological Exams on Admission: Dg Chest 2 View  Result Date: 09/30/2018 CLINICAL DATA:  Chest pain and cough. EXAM: CHEST - 2 VIEW COMPARISON:  September 09, 2018 FINDINGS: Patchy opacities in the lungs, right greater than left, particularly towards the right base. These findings are worse when compared to the chest x-ray from September 09, 2018. The heart, hila, mediastinum, lungs, and pleura are otherwise unremarkable. IMPRESSION: Patchy bilateral pulmonary opacities, right  greater than left. The findings are worsened compared to the November 2019 study and are most consistent with an infectious process. Recommend short-term follow-up to ensure resolution. Electronically Signed   By: Dorise Bullion III M.D   On: 09/30/2018 14:54    EKG: Independently reviewed.   Assessment/Plan Active Problems:   Pneumonia  Pneumonia-persistent cough and dyspnea with exertion despite outpatient treatment with oral antibiotics.  Febrile to 100.9.  WBC 11.2.  Two-view chest x-ray patchy opacities worse when compared to prior x-ray, consistent with infectious process. -Continue broad-spectrum IV antibiotics cefepime and vancomycin, pharmacy to dose -CBC, BMP in a.m. -Urine strep/urine Legionella antigen - IVF n/s + 40 KCL 100cc/hr X 10 hrs -Kaleab 6, DuoNebs as needed  Chest pain-atypical.  Risk factors smoking history.  Recent stress test 01/2018-read as low risk study.  Without significant ischemic territories.  EF 62%.  Possibly GI related - EKG - Trops x 3 -Continue Protonix  Hypokalemia- K- 3.  - Check Mag - Replete - BMP a.m  Colonization with MAC, Bronchiectasis hx- f/u with Dr. Arlyn Dunning.  Some cultures 2018 grew MAC.  There was initial plan for brochoscopy but this was deferred as patient symptoms had improved, and the patient was colonized and not infected.   COPD.  Quit smoking 8 years ago. -Continue bronchodilators  Bipolar disorder -Continue Seroquel, as needed Ativan,   DVT prophylaxis: Lovenox Code Status: Full Family Communication: None at bedside Disposition Plan: Per rounding team  consults called: None Admission status: Inpt, tele   Bethena Roys MD [facility] Pager [phone] From 3PM-11PM.  Otherwise please contact night-coverage [email or web address] Password [email or web address]  09/30/2018, 8:39 PM

## 2018-09-30 NOTE — Telephone Encounter (Signed)
Pt c/o severe weakness and fatigue. Pt very short of breath especially with exertion. Pt has a h/o positive for the flu and URI at [facility]. Pt c/o chest pain with coughing, chills, body aches, lack of appetite. Pt has been sick for 3 weeks. Pt very tearful during call. Emotional support provided.  Pt finished 2 courses of Zithromax. Pt c/o sinus drainage and pressure. Pt stated she is "hacking up blood." Pt believes it is from her sinuses. Pt sounds very sick and weak. Pt stated that she was not sure she was going to "make it until tomorrow." Advised pt that she needs to go to the hospital via EMS. Pt stated that she didn't want to go. Called PCP office and informed Gareth Eagle who agreed to call EMS and to send her to [facility].  Pt advised that I was calling 911 for transportation. (911 notified and they are sending help now.) Pt asked NT to call her husband and inform him that she is going to [facility]. Called Shelena Castelluccio [phone] as per pt request and updated on pt's condition.  Reason for Disposition . Patient sounds very sick or weak to the triager  Answer Assessment - Initial Assessment Questions 1. DESCRIPTION: "Describe how you are feeling."     Weak and fatigued  2. SEVERITY: "How bad is it?"  "Can you stand and walk?"   - MILD - Feels weak or tired, but does not interfere with work, school or normal activities   - [place] to stand and walk; weakness interferes with work, school, or normal activities   - SEVERE - Unable to stand or walk     moderate 3. ONSET:  "When did the weakness begin?"     The duration of sickness 4. CAUSE: "What do you think is causing the weakness?"     Flu upper respiratory infection 5. MEDICINES: "Have you recently started a new medicine or had a change in the amount of a medicine?" Finished 2 rounds of Zithromax 6. OTHER SYMPTOMS: "Do you have any other symptoms?" (e.g., chest pain, fever, cough, SOB, vomiting, diarrhea, bleeding, other areas of  pain)     Headache, cough, SOB, weakness, dizziness, poor appetite, chest pain 7. PREGNANCY: "Is there any chance you are pregnant?" "When was your last menstrual period?"     n/a  Protocols used: WEAKNESS (GENERALIZED) AND FATIGUE-A-AH

## 2018-10-01 ENCOUNTER — Ambulatory Visit: Payer: Self-pay | Admitting: Internal Medicine

## 2018-10-01 DIAGNOSIS — J181 Lobar pneumonia, unspecified organism: Principal | ICD-10-CM

## 2018-10-01 DIAGNOSIS — D696 Thrombocytopenia, unspecified: Secondary | ICD-10-CM

## 2018-10-01 LAB — MRSA PCR SCREENING: MRSA by PCR: NEGATIVE

## 2018-10-01 LAB — CBC
HCT: 36.9 % (ref 36.0–46.0)
Hemoglobin: 11.8 g/dL — ABNORMAL LOW (ref 12.0–15.0)
MCH: 28.2 pg (ref 26.0–34.0)
MCHC: 32 g/dL (ref 30.0–36.0)
MCV: 88.3 fL (ref 80.0–100.0)
PLATELETS: 148 10*3/uL — AB (ref 150–400)
RBC: 4.18 MIL/uL (ref 3.87–5.11)
RDW: 12.8 % (ref 11.5–15.5)
WBC: 8.7 10*3/uL (ref 4.0–10.5)
nRBC: 0 % (ref 0.0–0.2)

## 2018-10-01 LAB — RESPIRATORY PANEL BY PCR

## 2018-10-01 LAB — PROCALCITONIN: Procalcitonin: <0.1 ng/mL

## 2018-10-01 LAB — INFLUENZA PANEL BY PCR (TYPE A & B)
INFLBPCR: NEGATIVE
Influenza A By PCR: NEGATIVE

## 2018-10-01 LAB — BASIC METABOLIC PANEL
Anion gap: 3 — ABNORMAL LOW (ref 5–15)
BUN: 10 mg/dL (ref 8–23)
CO2: 21 mmol/L — ABNORMAL LOW (ref 22–32)
Calcium: 8.9 mg/dL (ref 8.9–10.3)
Chloride: 116 mmol/L — ABNORMAL HIGH (ref 98–111)
Creatinine, Ser: 0.64 mg/dL (ref 0.44–1.00)
GFR calc Af Amer: >60 mL/min (ref >60)
GFR calc non Af Amer: >60 mL/min (ref >60)
Glucose, Bld: 154 mg/dL — ABNORMAL HIGH (ref 70–99)
Potassium: 4.9 mmol/L (ref 3.5–5.1)
Sodium: 140 mmol/L (ref 135–145)

## 2018-10-01 LAB — STREP PNEUMONIAE URINARY ANTIGEN: Strep Pneumo Urinary Antigen: NEGATIVE

## 2018-10-01 LAB — TROPONIN I
Troponin I: <0.03 ng/mL (ref <0.03)
Troponin I: <0.03 ng/mL (ref <0.03)

## 2018-10-01 LAB — FOLATE: Folate: 6.8 ng/mL (ref >5.9)

## 2018-10-01 LAB — VITAMIN B12: Vitamin B-12: 370 pg/mL (ref 180–914)

## 2018-10-01 MED ORDER — MAGNESIUM SULFATE 2 GM/50ML IV SOLN
2.0000 g | Freq: Once | INTRAVENOUS | Status: AC
  Administered 2018-10-01: 2 g via INTRAVENOUS
  Filled 2018-10-01: qty 50

## 2018-10-01 NOTE — Progress Notes (Signed)
PROGRESS NOTE  Melody [name] QQV:[id number] DOB: 1956-01-29 DOA: 09/30/2018 PCP: Cassandria Anger, MD  Brief History:  62 year old female with a history of hypothyroidism, bronchiectasis, COPD, MAI colonization presenting with 3 weeks coughing, shortness of breath, and generalized weakness.  The patient states that approximately 1 month prior to this admission, the patient visited the emergency department at [facility]  Apparently, the patient was told that she may have had influenza and was discharged in stable condition from the emergency department.  Since then, her symptoms persisted with subjective fevers and chills at home.  She called her primary care provider, the patient was given 2 consecutive courses of azithromycin without improvement.  The patient denies any hemoptysis, nausea, vomiting, diarrhea.  Because of her continued and persistent symptoms, the patient presented for further evaluation.  Upon presentation, the patient was noted to have a temperature 100.9 F with WBC 11.3.  Chest x-ray showed bilateral patchy opacities, right greater than left.  Patient was started vancomycin and cefepime.  Assessment/Plan: Lobar pneumonia -Check influenza PCR -Viral respiratory panel -Start duo nebs -Continue cefepime -Discontinue vancomycin -Follow blood cultures  COPD -Stable presently on room air -Start duo nebs -Continue Breo  Atypical chest pain -Related to patient's coughing -Personally reviewed EKG--sinus rhythm, no ST ST wave changes -Personally reviewed chest x-ray--bilateral patchy opacities, regarding the left -Troponins negative x 2  Hypokalemia -Repleted -A.m. BMP  MAI colonization -The patient follows Dr. [place] Bells at [facility] pulmonary  Hypothyroidism -Continue Synthroid  Thrombocytopenia -This appears to be chronic and stable dating back to 3329 -Serum J18 -Folic acid -Monitor for signs of bleeding      Disposition Plan:   Home in 1-2 days    Family Communication:   No Family at bedside  Consultants:  none Code Status:  FULL   DVT Prophylaxis:    Lovenox   Procedures: As Listed in Progress Note Above  Antibiotics: vanco 12/3>>12/4 Cefepime 12/3>>>       Subjective: Patient continues to complain some shortness of breath and coughing although it is low but better than yesterday.  She denies any nausea, vomiting or diarrhea, fevers, chills, headache, neck pain.  There is no abdominal pain, dysuria, hematuria.  There is no hematochezia melena.  There is no hemoptysis.  Objective: Vitals:   09/30/18 1930 09/30/18 2030 09/30/18 2056 10/01/18 0633  BP: 132/65 123/70  120/73  Pulse: 81 81  65  Resp:  18  18  Temp:  98.3 F (36.8 C)  97.9 F (36.6 C)  TempSrc:  Oral  Oral  SpO2: 96% 97% 97% 98%  Weight:  62.7 kg    Height:  5\' 6"  (1.676 m)      Intake/Output Summary (Last 24 hours) at 10/01/2018 0838 Last data filed at 10/01/2018 0600 Gross per 24 hour  Intake 1177.25 ml  Output 800 ml  Net 377.25 ml   Weight change:  Exam:   General:  Pt is alert, follows commands appropriately, not in acute distress  HEENT: No icterus, No thrush, No neck mass, [place]/AT  Cardiovascular: RRR, S1/S2, no rubs, no gallops  Respiratory: Right-sided rales.  Left with auscultation.  No wheezing.  Abdomen: Soft/+BS, non tender, non distended, no guarding  Extremities: No edema, No lymphangitis, No petechiae, No rashes, no synovitis   Data Reviewed: I have personally reviewed following labs and imaging studies Basic Metabolic Panel: Recent Labs  Lab 09/30/18 1406 10/01/18 0307  NA 140 140  K 3.0* 4.9  CL 108 116*  CO2 24 21*  GLUCOSE 92 154*  BUN 12 10  CREATININE 0.74 0.64  CALCIUM 9.1 8.9  MG 2.0  --    Liver Function Tests: Recent Labs  Lab 09/30/18 1406  AST 14*  ALT 10  ALKPHOS 69  BILITOT 1.0  PROT 7.5  ALBUMIN 4.2   No results for input(s): LIPASE, AMYLASE in the last 168 hours. No results  for input(s): AMMONIA in the last 168 hours. Coagulation Profile: No results for input(s): INR, PROTIME in the last 168 hours. CBC: Recent Labs  Lab 09/30/18 1406 10/01/18 0307  WBC 11.3* 8.7  NEUTROABS 9.9*  --   HGB 13.1 11.8*  HCT 40.2 36.9  MCV 87.8 88.3  PLT 156 148*   Cardiac Enzymes: Recent Labs  Lab 09/30/18 2110 10/01/18 0307  TROPONINI <0.03 <0.03   BNP: Invalid input(s): POCBNP CBG: No results for input(s): GLUCAP in the last 168 hours. HbA1C: No results for input(s): HGBA1C in the last 72 hours. Urine analysis:    Component Value Date/Time   COLORURINE YELLOW 09/30/2018 1424   APPEARANCEUR CLEAR 09/30/2018 1424   LABSPEC 1.016 09/30/2018 1424   PHURINE 6.0 09/30/2018 1424   GLUCOSEU NEGATIVE 09/30/2018 1424   GLUCOSEU NEGATIVE 05/16/2018 1454   HGBUR MODERATE (A) 09/30/2018 1424   BILIRUBINUR NEGATIVE 09/30/2018 1424   BILIRUBINUR n 04/27/2016 1513   KETONESUR NEGATIVE 09/30/2018 1424   PROTEINUR NEGATIVE 09/30/2018 1424   UROBILINOGEN 0.2 05/16/2018 1454   NITRITE NEGATIVE 09/30/2018 1424   LEUKOCYTESUR NEGATIVE 09/30/2018 1424   Sepsis Labs: @LABRCNTIP (procalcitonin:4,lacticidven:4) ) Recent Results (from the past 240 hour(s))  Culture, blood (routine x 2)     Status: None (Preliminary result)   Collection Time: 09/30/18  2:10 PM  Result Value Ref Range Status   Specimen Description BLOOD LEFT ANTECUBITAL  Final   Special Requests   Final    BOTTLES DRAWN AEROBIC AND ANAEROBIC Blood Culture adequate volume   Culture   Final    NO GROWTH < 24 HOURS Performed at [facility], [address], [place], [place] [address]    Report Status PENDING  Incomplete  Culture, blood (routine x 2)     Status: None (Preliminary result)   Collection Time: 09/30/18  3:24 PM  Result Value Ref Range Status   Specimen Description BLOOD RIGHT ANTECUBITAL  Final   Special Requests   Final    BOTTLES DRAWN AEROBIC AND ANAEROBIC Blood Culture adequate volume    Culture   Final    NO GROWTH < 24 HOURS Performed at [facility], [address], [place], [place] [address]    Report Status PENDING  Incomplete  MRSA PCR Screening     Status: None   Collection Time: 09/30/18  8:26 PM  Result Value Ref Range Status   MRSA by PCR NEGATIVE NEGATIVE Final    Comment:        The GeneXpert MRSA Assay (FDA approved for NASAL specimens only), is one component of a comprehensive MRSA colonization surveillance program. It is not intended to diagnose MRSA infection nor to guide or monitor treatment for MRSA infections. Performed at [facility], [address], [place], [place] [address]      Scheduled Meds: . dextromethorphan-guaiFENesin  1 tablet Oral BID  . enoxaparin (LOVENOX) injection  40 mg Subcutaneous Q24H  . fluticasone furoate-vilanterol  1 puff Inhalation Daily  . levothyroxine  50 mcg Oral Q0600  . pantoprazole  40  mg Oral Daily  . QUEtiapine  25 mg Oral QHS   Continuous Infusions: . ceFEPime (MAXIPIME) IV 1 g (09/30/18 2319)  . vancomycin      Procedures/Studies: Dg Chest 2 View  Result Date: 09/30/2018 CLINICAL DATA:  Chest pain and cough. EXAM: CHEST - 2 VIEW COMPARISON:  September 09, 2018 FINDINGS: Patchy opacities in the lungs, right greater than left, particularly towards the right base. These findings are worse when compared to the chest x-ray from September 09, 2018. The heart, hila, mediastinum, lungs, and pleura are otherwise unremarkable. IMPRESSION: Patchy bilateral pulmonary opacities, right greater than left. The findings are worsened compared to the November 2019 study and are most consistent with an infectious process. Recommend short-term follow-up to ensure resolution. Electronically Signed   By: Dorise Bullion III M.D   On: 09/30/2018 14:54    Orson Eva, DO  [facility] Pager [phone]  If 7PM-7AM, please contact night-coverage [email or web address] Password [email or web address] 10/01/2018, 8:38 AM   LOS: 1 day
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
## 2018-10-02 LAB — BASIC METABOLIC PANEL
Anion gap: 6 (ref 5–15)
BUN: 14 mg/dL (ref 8–23)
CHLORIDE: 111 mmol/L (ref 98–111)
CO2: 25 mmol/L (ref 22–32)
CREATININE: 0.75 mg/dL (ref 0.44–1.00)
Calcium: 8.8 mg/dL — ABNORMAL LOW (ref 8.9–10.3)
GFR calc Af Amer: >60 mL/min (ref >60)
GFR calc non Af Amer: >60 mL/min (ref >60)
Glucose, Bld: 83 mg/dL (ref 70–99)
POTASSIUM: 4 mmol/L (ref 3.5–5.1)
Sodium: 142 mmol/L (ref 135–145)

## 2018-10-02 LAB — TSH: TSH: 3.055 u[IU]/mL (ref 0.350–4.500)

## 2018-10-02 LAB — HIV ANTIBODY (ROUTINE TESTING W REFLEX): HIV Screen 4th Generation wRfx: NONREACTIVE

## 2018-10-02 LAB — T4, FREE: Free T4: 0.88 ng/dL (ref 0.82–1.77)

## 2018-10-02 MED ORDER — LEVOFLOXACIN 750 MG PO TABS: 750.0000 mg | ORAL_TABLET | Freq: Every day | ORAL | Status: DC

## 2018-10-02 MED ORDER — LEVOFLOXACIN 750 MG PO TABS: 750.0000 mg | ORAL_TABLET | Freq: Every day | ORAL | 0 refills | Status: DC

## 2018-10-02 NOTE — Progress Notes (Signed)
Pt IV removed, WNL. D/C instructions given to pt. Verbalized understanding. Pt medications returned to pt. Pt waiting on family member to transport home.

## 2018-10-02 NOTE — Discharge Summary (Signed)
Physician Discharge Summary  Melody [name] QZR:[id number] DOB: 1956/09/28 DOA: 09/30/2018  PCP: Cassandria Anger, MD  Admit date: 09/30/2018 Discharge date: 10/02/2018  Admitted From: Home Disposition:  Home   Recommendations for Outpatient Follow-up:  1. Follow up with PCP in 1-2 weeks 2. Please obtain BMP/CBC in one week     Discharge Condition: Stable CODE STATUS: FULL Diet recommendation: Heart Healthy   Brief/Interim Summary: 62 year old female with a history of hypothyroidism, bronchiectasis, COPD, MAI colonization presenting with 3 weeks coughing, shortness of breath, and generalized weakness.  The patient states that approximately 1 month prior to this admission, the patient visited the emergency department at [facility].  Apparently, the patient was told that she may have had influenza and was discharged in stable condition from the emergency department.  Since then, her symptoms persisted with subjective fevers and chills at home.  She called her primary care provider, the patient was given 2 consecutive courses of azithromycin without improvement.  The patient denies any hemoptysis, nausea, vomiting, diarrhea.  Because of her continued and persistent symptoms, the patient presented for further evaluation.  Upon presentation, the patient was noted to have a temperature 100.9 F with WBC 11.3.  Chest x-ray showed bilateral patchy opacities, right greater than left.  Patient was started vancomycin and cefepime.  Discharge Diagnoses:  Lobar pneumonia -Check influenza PCR--neg -Viral respiratory panel---neg -Start duo nebs -Continue cefepime>>>home with levofloxacin x 4 more days to complete 1 week of tx -Discontinue vancomycin -Follow blood cultures--neg at time of d/c  COPD -Stable presently on room air -Start duo nebs -Continue Breo  Atypical chest pain -Related to patient's coughing -Personally reviewed EKG--sinus rhythm, no ST ST wave changes -Personally reviewed  chest x-ray--bilateral patchy opacities, regarding the left -Troponins negative x 3  Hypokalemia -Repleted  MAI colonization -The patient follows Dr. Lake Bells at [facility] pulmonary  Hypothyroidism -Continue Synthroid  Thrombocytopenia -This appears to be chronic and stable dating back to 2017 -Serum H54--562 -Folic BWLS-9.3 -Monitor for signs of bleeding-->none      Discharge Instructions   Allergies as of 10/02/2018      Reactions   Flagyl [metronidazole] Shortness Of Breath   Made tongue turn dark red, was not able to breath.   Doxycycline Hyclate Itching   Gabapentin    Headaches: Patient is not aware of the reaction to this medication   Nabumetone Other (See Comments)   Headache   Sulfa Antibiotics Other (See Comments)   Reaction is unknown   Tetracycline Hcl Itching   Tramadol Hcl Other (See Comments)   Reaction is unknown      Medication List    STOP taking these medications   ALIGN 4 MG Caps   azithromycin 250 MG tablet Commonly known as:  ZITHROMAX   cephALEXin 500 MG capsule Commonly known as:  KEFLEX   SUMAtriptan 100 MG tablet Commonly known as:  IMITREX     TAKE these medications   albuterol 108 (90 Base) MCG/ACT inhaler Commonly known as:  PROVENTIL HFA;VENTOLIN HFA Inhale 2 puffs into the lungs every 6 (six) hours as needed for wheezing or shortness of breath. What changed:  Another medication with the same name was removed. Continue taking this medication, and follow the directions you see here.   ALPRAZolam 1 MG tablet Commonly known as:  XANAX Take 1 mg by mouth 4 (four) times daily.   Azelastine-Fluticasone 137-50 MCG/ACT Susp Place 1 spray 2 (two) times daily into the nose.   benzonatate 200 MG capsule  Commonly known as:  TESSALON Take 200 mg by mouth every 8 (eight) hours as needed for cough.   BREO ELLIPTA 100-25 MCG/INH Aepb Generic drug:  fluticasone furoate-vilanterol inhale ONE PUFF into THE lungs DAILY     cyanocobalamin 1000 MCG/ML injection Commonly known as:  (VITAMIN B-12) INJECT 1 ML INTO THE MUSCLE EVERY 14 DAYS. What changed:  See the new instructions.   fluticasone 50 MCG/ACT nasal spray Commonly known as:  FLONASE USE 2 SPRAYS IN BOTH NOSTRILS DAILY. What changed:  See the new instructions.   FLUTTER Devi Use as directed   ketorolac 10 MG tablet Commonly known as:  TORADOL Take 10 mg by mouth every 6 (six) hours as needed. for pain   levocetirizine 5 MG tablet Commonly known as:  XYZAL TAKE 1 TABLET BY MOUTH EVERY EVENING.   levofloxacin 750 MG tablet Commonly known as:  LEVAQUIN Take 1 tablet (750 mg total) by mouth daily. Start taking on:  10/03/2018   levothyroxine 50 MCG tablet Commonly known as:  SYNTHROID, LEVOTHROID TAKE ONE TABLET BY MOUTH DAILY BEFORE breakfast What changed:    how much to take  how to take this  when to take this  additional instructions   methylphenidate 20 MG tablet Commonly known as:  RITALIN Take 20 mg by mouth 2 (two) times daily.   oxyCODONE-acetaminophen 5-325 MG tablet Commonly known as:  PERCOCET/ROXICET Take 1 tablet by mouth 2 (two) times daily as needed for severe pain. Fill on or after 10/08/18   oxyCODONE-acetaminophen 5-325 MG tablet Commonly known as:  PERCOCET/ROXICET Take 1 tablet by mouth 2 (two) times daily as needed for severe pain. Fill on or after 09/08/18   oxyCODONE-acetaminophen 5-325 MG tablet Commonly known as:  PERCOCET/ROXICET Take 1 tablet by mouth 2 (two) times daily as needed for severe pain. Fill on or after 08/09/18   pantoprazole 40 MG tablet Commonly known as:  PROTONIX TAKE 1 TABLET BY MOUTH DAILY   QUEtiapine 25 MG tablet Commonly known as:  SEROQUEL Take 25 mg by mouth at bedtime.   triamcinolone ointment 0.1 % Commonly known as:  KENALOG Apply 1 application topically 2 (two) times daily. What changed:    when to take this  reasons to take this   Vitamin D  (Ergocalciferol) 1.25 MG (50000 UT) Caps capsule Commonly known as:  DRISDOL TAKE ONE CAPSULE BY MOUTH EVERY SEVEN DAYS What changed:  See the new instructions.       Allergies  Allergen Reactions  . Flagyl [Metronidazole] Shortness Of Breath    Made tongue turn dark red, was not able to breath.  . Doxycycline Hyclate Itching  . Gabapentin     Headaches: Patient is not aware of the reaction to this medication  . Nabumetone Other (See Comments)    Headache  . Sulfa Antibiotics Other (See Comments)    Reaction is unknown  . Tetracycline Hcl Itching  . Tramadol Hcl Other (See Comments)    Reaction is unknown    Consultations:  none   Procedures/Studies: Dg Chest 2 View  Result Date: 09/30/2018 CLINICAL DATA:  Chest pain and cough. EXAM: CHEST - 2 VIEW COMPARISON:  September 09, 2018 FINDINGS: Patchy opacities in the lungs, right greater than left, particularly towards the right base. These findings are worse when compared to the chest x-ray from September 09, 2018. The heart, hila, mediastinum, lungs, and pleura are otherwise unremarkable. IMPRESSION: Patchy bilateral pulmonary opacities, right greater than left. The findings are worsened compared  to the November 2019 study and are most consistent with an infectious process. Recommend short-term follow-up to ensure resolution. Electronically Signed   By: Dorise Bullion III M.D   On: 09/30/2018 14:54        Discharge Exam: Vitals:   10/02/18 0643 10/02/18 0835  BP: 125/77   Pulse: 61   Resp:    Temp: 98.1 F (36.7 C)   SpO2: 97% 98%   Vitals:   10/01/18 1656 10/01/18 2159 10/02/18 0643 10/02/18 0835  BP: (!) 141/73 117/66 125/77   Pulse: 66 76 61   Resp:  20    Temp:  98.1 F (36.7 C) 98.1 F (36.7 C)   TempSrc:  Oral Oral   SpO2:  98% 97% 98%  Weight:      Height:        General: Pt is alert, awake, not in acute distress Cardiovascular: RRR, S1/S2 +, no rubs, no gallops Respiratory: CTA bilaterally, no  wheezing, no rhonchi Abdominal: Soft, NT, ND, bowel sounds + Extremities: no edema, no cyanosis   The results of significant diagnostics from this hospitalization (including imaging, microbiology, ancillary and laboratory) are listed below for reference.    Significant Diagnostic Studies: Dg Chest 2 View  Result Date: 09/30/2018 CLINICAL DATA:  Chest pain and cough. EXAM: CHEST - 2 VIEW COMPARISON:  September 09, 2018 FINDINGS: Patchy opacities in the lungs, right greater than left, particularly towards the right base. These findings are worse when compared to the chest x-ray from September 09, 2018. The heart, hila, mediastinum, lungs, and pleura are otherwise unremarkable. IMPRESSION: Patchy bilateral pulmonary opacities, right greater than left. The findings are worsened compared to the November 2019 study and are most consistent with an infectious process. Recommend short-term follow-up to ensure resolution. Electronically Signed   By: Dorise Bullion III M.D   On: 09/30/2018 14:54     Microbiology: Recent Results (from the past 240 hour(s))  Culture, blood (routine x 2)     Status: None (Preliminary result)   Collection Time: 09/30/18  2:10 PM  Result Value Ref Range Status   Specimen Description BLOOD LEFT ANTECUBITAL  Final   Special Requests   Final    BOTTLES DRAWN AEROBIC AND ANAEROBIC Blood Culture adequate volume   Culture   Final    NO GROWTH 2 DAYS Performed at [facility], [address] [place], [place] [address]    Report Status PENDING  Incomplete  Culture, blood (routine x 2)     Status: None (Preliminary result)   Collection Time: 09/30/18  3:24 PM  Result Value Ref Range Status   Specimen Description BLOOD RIGHT ANTECUBITAL  Final   Special Requests   Final    BOTTLES DRAWN AEROBIC AND ANAEROBIC Blood Culture adequate volume   Culture   Final    NO GROWTH 2 DAYS Performed at [facility], [address], [place], [place] [address]    Report Status PENDING   Incomplete  MRSA PCR Screening     Status: None   Collection Time: 09/30/18  8:26 PM  Result Value Ref Range Status   MRSA by PCR NEGATIVE NEGATIVE Final    Comment:        The GeneXpert MRSA Assay (FDA approved for NASAL specimens only), is one component of a comprehensive MRSA colonization surveillance program. It is not intended to diagnose MRSA infection nor to guide or monitor treatment for MRSA infections. Performed at [facility], [address] [place], [place] [address]  Respiratory Panel by PCR     Status: None   Collection Time: 10/01/18  8:44 AM  Result Value Ref Range Status   Adenovirus NOT DETECTED NOT DETECTED Final   Coronavirus 229E NOT DETECTED NOT DETECTED Final   Coronavirus HKU1 NOT DETECTED NOT DETECTED Final   Coronavirus NL63 NOT DETECTED NOT DETECTED Final   Coronavirus OC43 NOT DETECTED NOT DETECTED Final   Metapneumovirus NOT DETECTED NOT DETECTED Final   Rhinovirus / Enterovirus NOT DETECTED NOT DETECTED Final   Influenza A NOT DETECTED NOT DETECTED Final   Influenza B NOT DETECTED NOT DETECTED Final   Parainfluenza Virus 1 NOT DETECTED NOT DETECTED Final   Parainfluenza Virus 2 NOT DETECTED NOT DETECTED Final   Parainfluenza Virus 3 NOT DETECTED NOT DETECTED Final   Parainfluenza Virus 4 NOT DETECTED NOT DETECTED Final   Respiratory Syncytial Virus NOT DETECTED NOT DETECTED Final   Bordetella pertussis NOT DETECTED NOT DETECTED Final   Chlamydophila pneumoniae NOT DETECTED NOT DETECTED Final   Mycoplasma pneumoniae NOT DETECTED NOT DETECTED Final    Comment: Performed at [facility], [place] [address] [place], [place] [address]     Labs: Basic Metabolic Panel: Recent Labs  Lab 09/30/18 1406 10/01/18 0307 10/02/18 0532  NA 140 140 142  K 3.0* 4.9 4.0  CL 108 116* 111  CO2 24 21* 25  GLUCOSE 92 154* 83  BUN 12 10 14   CREATININE 0.74 0.64 0.75  CALCIUM 9.1 8.9 8.8*  MG 2.0  --   --    Liver Function Tests: Recent Labs  Lab  09/30/18 1406  AST 14*  ALT 10  ALKPHOS 69  BILITOT 1.0  PROT 7.5  ALBUMIN 4.2   No results for input(s): LIPASE, AMYLASE in the last 168 hours. No results for input(s): AMMONIA in the last 168 hours. CBC: Recent Labs  Lab 09/30/18 1406 10/01/18 0307  WBC 11.3* 8.7  NEUTROABS 9.9*  --   HGB 13.1 11.8*  HCT 40.2 36.9  MCV 87.8 88.3  PLT 156 148*   Cardiac Enzymes: Recent Labs  Lab 09/30/18 2110 10/01/18 0307 10/01/18 0930  TROPONINI <0.03 <0.03 <0.03   BNP: Invalid input(s): POCBNP CBG: No results for input(s): GLUCAP in the last 168 hours.  Time coordinating discharge:  36 minutes  Signed:  Orson Eva, DO [facility] Pager: [phone] 10/02/2018, 12:24 PM

## 2018-10-03 ENCOUNTER — Telehealth: Payer: Self-pay | Admitting: *Deleted

## 2018-10-03 LAB — LEGIONELLA PNEUMOPHILA SEROGP 1 UR AG: L. pneumophila Serogp 1 Ur Ag: NEGATIVE

## 2018-10-03 NOTE — Telephone Encounter (Signed)
Tried calling pt to make hosp f/u appt no answer, and can't leave msg due to vm not set-up. Will retry later.Marland KitchenJohny Chess

## 2018-10-03 NOTE — Telephone Encounter (Signed)
Pt return the call back completed TCM call below.Melody [name]  Transition Care Management Follow-up Telephone Call   Date discharged? 10/02/18   How have you been since you were released from the hospital? Pt states she is ok, but is tired. Also have this nagging headache. She states she have some headache medicine that was rx when she was in the hospital before will try to take and get some rest   Do you understand why you were in the hospital? YES   Do you understand the discharge instructions? YES   Where were you discharged to? Home   Items Reviewed:  Medications reviewed: YES  Allergies reviewed: YES  Dietary changes reviewed: YES  Referrals reviewed: No referral needed   Functional Questionnaire:   Activities of Daily Living (ADLs):   She states she are independent in the following: ambulation, bathing and hygiene, feeding, continence, grooming, toileting and dressing States she doesn't require assistance    Any transportation issues/concerns?: YES, but she states she will get someone to bring her   Any patient concerns? NO   Confirmed importance and date/time of follow-up visits scheduled YES, appt 10/14/18  Provider Appointment booked with Dr. Alain Marion  Confirmed with patient if condition begins to worsen call PCP or go to the ER.  Patient was given the office number and encouraged to call back with question or concerns.  : YES

## 2018-10-05 LAB — CULTURE, BLOOD (ROUTINE X 2)
Culture: NO GROWTH
Culture: NO GROWTH
Special Requests: ADEQUATE
Special Requests: ADEQUATE

## 2018-10-14 ENCOUNTER — Inpatient Hospital Stay: Payer: Self-pay | Admitting: Internal Medicine

## 2018-10-17 ENCOUNTER — Encounter: Payer: Self-pay | Admitting: Internal Medicine

## 2018-10-17 ENCOUNTER — Ambulatory Visit (INDEPENDENT_AMBULATORY_CARE_PROVIDER_SITE_OTHER): Payer: Medicare Other | Admitting: Internal Medicine

## 2018-10-17 ENCOUNTER — Other Ambulatory Visit (INDEPENDENT_AMBULATORY_CARE_PROVIDER_SITE_OTHER): Payer: Medicare Other

## 2018-10-17 VITALS — BP 124/76 | HR 82 | Temp 97.8°F | Ht 66.0 in | Wt 140.0 lb

## 2018-10-17 DIAGNOSIS — E034 Atrophy of thyroid (acquired): Secondary | ICD-10-CM

## 2018-10-17 DIAGNOSIS — E538 Deficiency of other specified B group vitamins: Secondary | ICD-10-CM | POA: Diagnosis not present

## 2018-10-17 DIAGNOSIS — M5440 Lumbago with sciatica, unspecified side: Secondary | ICD-10-CM | POA: Diagnosis not present

## 2018-10-17 DIAGNOSIS — J189 Pneumonia, unspecified organism: Secondary | ICD-10-CM | POA: Diagnosis not present

## 2018-10-17 DIAGNOSIS — G8929 Other chronic pain: Secondary | ICD-10-CM

## 2018-10-17 DIAGNOSIS — F319 Bipolar disorder, unspecified: Secondary | ICD-10-CM | POA: Diagnosis not present

## 2018-10-17 LAB — CBC WITH DIFFERENTIAL/PLATELET
Basophils Absolute: 0.1 10*3/uL (ref 0.0–0.1)
Basophils Relative: 1 % (ref 0.0–3.0)
Eosinophils Absolute: 0.1 10*3/uL (ref 0.0–0.7)
Eosinophils Relative: 1.1 % (ref 0.0–5.0)
HCT: 41 % (ref 36.0–46.0)
Hemoglobin: 13.6 g/dL (ref 12.0–15.0)
Lymphocytes Relative: 19.1 % (ref 12.0–46.0)
Lymphs Abs: 1 10*3/uL (ref 0.7–4.0)
MCHC: 33.2 g/dL (ref 30.0–36.0)
MCV: 86.9 fl (ref 78.0–100.0)
MONO ABS: 0.4 10*3/uL (ref 0.1–1.0)
Monocytes Relative: 6.8 % (ref 3.0–12.0)
Neutro Abs: 3.8 10*3/uL (ref 1.4–7.7)
Neutrophils Relative %: 72 % (ref 43.0–77.0)
Platelets: 174 10*3/uL (ref 150.0–400.0)
RBC: 4.72 Mil/uL (ref 3.87–5.11)
RDW: 13.5 % (ref 11.5–15.5)
WBC: 5.2 10*3/uL (ref 4.0–10.5)

## 2018-10-17 LAB — BASIC METABOLIC PANEL
BUN: 16 mg/dL (ref 6–23)
CO2: 30 mEq/L (ref 19–32)
Calcium: 9.5 mg/dL (ref 8.4–10.5)
Chloride: 106 mEq/L (ref 96–112)
Creatinine, Ser: 0.76 mg/dL (ref 0.40–1.20)
GFR: 81.46 mL/min (ref >60.00)
Glucose, Bld: 80 mg/dL (ref 70–99)
Potassium: 3.6 mEq/L (ref 3.5–5.1)
Sodium: 143 mEq/L (ref 135–145)

## 2018-10-17 MED ORDER — OXYCODONE-ACETAMINOPHEN 5-325 MG PO TABS: 1.0000 | ORAL_TABLET | Freq: Two times a day (BID) | ORAL | 0 refills | Status: DC | PRN
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
## 2018-10-17 NOTE — Assessment & Plan Note (Signed)
On levothroid 

## 2018-10-17 NOTE — Assessment & Plan Note (Signed)
On B12 

## 2018-10-17 NOTE — Assessment & Plan Note (Signed)
Treated inpatient

## 2018-10-17 NOTE — Assessment & Plan Note (Signed)
on Percocet   Potential benefits of a long term opioids use as well as potential risks (i.e. addiction risk, apnea etc) and complications (i.e. Somnolence, constipation and others) were explained to the patient and were aknowledged.

## 2018-10-17 NOTE — Progress Notes (Signed)
Subjective:  Patient ID: Melody [name], female    DOB: July 25, 1956  Age: 62 y.o. MRN: [id number]  CC: No chief complaint on file.   HPI Melody [name] presents for post-hosp f/u for CAP d/c'd on 12/5  Per hx:   "Brief/Interim Summary: 62 year old female with a history of hypothyroidism, bronchiectasis, COPD, MAI colonization presenting with 3 weeks coughing, shortness of breath, and generalized weakness. The patient states that approximately 1 month prior to this admission, the patient visited the emergency department at [facility] Apparently, the patient was told that she may have had influenza and was discharged in stable condition from the emergency department. Since then, her symptoms persisted with subjective fevers and chills at home. She called her primary care provider, the patient was given 2 consecutive courses of azithromycin without improvement. The patient denies any hemoptysis, nausea, vomiting, diarrhea. Because of her continued and persistent symptoms, the patient presented for further evaluation. Upon presentation, the patient was noted to have a temperature 100.9 F with WBC 11.3. Chest x-ray showed bilateral patchy opacities, right greater than left. Patient was started vancomycin and cefepime.  Discharge Diagnoses:  Lobar pneumonia -Check influenza PCR--neg -Viral respiratory panel---neg -Start duo nebs -Continue cefepime>>>home with levofloxacin x 4 more days to complete 1 week of tx -Discontinue vancomycin -Follow blood cultures--neg at time of d/c  COPD -Stable presently on room air -Start duo nebs -Continue Breo  Atypical chest pain -Related to patient's coughing -Personally reviewed EKG--sinus rhythm, no ST ST wave changes -Personally reviewed chest x-ray--bilateral patchy opacities, regarding the left -Troponins negativex 3  Hypokalemia -Repleted  MAIcolonization -The patient follows Dr. Elfredia Nevins  pulmonary  Hypothyroidism -Continue Synthroid  Thrombocytopenia -This appears to be chronic and stable dating back to 2017 -Serum G62--694 -Folic WNIO-2.7 -Monitor for signs of bleeding-->none"     Outpatient Medications Prior to Visit  Medication Sig Dispense Refill  . albuterol (PROVENTIL HFA;VENTOLIN HFA) 108 (90 Base) MCG/ACT inhaler Inhale 2 puffs into the lungs every 6 (six) hours as needed for wheezing or shortness of breath. 1 Inhaler 6  . ALPRAZolam (XANAX) 1 MG tablet Take 1 mg by mouth 4 (four) times daily.     . Azelastine-Fluticasone (DYMISTA) 137-50 MCG/ACT SUSP Place 1 spray 2 (two) times daily into the nose. 1 Bottle 3  . benzonatate (TESSALON) 200 MG capsule Take 200 mg by mouth every 8 (eight) hours as needed for cough.   0  . BREO ELLIPTA 100-25 MCG/INH AEPB inhale ONE PUFF into THE lungs DAILY 60 each 11  . cyanocobalamin (,VITAMIN B-12,) 1000 MCG/ML injection INJECT 1 ML INTO THE MUSCLE EVERY 14 DAYS. (Patient taking differently: 1,000 mcg every 14 (fourteen) days. ) 2 mL 5  . fluticasone (FLONASE) 50 MCG/ACT nasal spray USE 2 SPRAYS IN BOTH NOSTRILS DAILY. (Patient taking differently: Place 2 sprays into both nostrils daily. ) 16 g 11  . ketorolac (TORADOL) 10 MG tablet Take 10 mg by mouth every 6 (six) hours as needed. for pain  0  . levocetirizine (XYZAL) 5 MG tablet TAKE 1 TABLET BY MOUTH EVERY EVENING. (Patient taking differently: Take 5 mg by mouth every evening. ) 30 tablet 11  . levofloxacin (LEVAQUIN) 750 MG tablet Take 1 tablet (750 mg total) by mouth daily. 4 tablet 0  . levothyroxine (SYNTHROID, LEVOTHROID) 50 MCG tablet TAKE ONE TABLET BY MOUTH DAILY BEFORE breakfast (Patient taking differently: Take 50 mcg by mouth daily before breakfast. ) 30 tablet 11  . methylphenidate (RITALIN) 20 MG  tablet Take 20 mg by mouth 2 (two) times daily.   0  . oxyCODONE-acetaminophen (PERCOCET/ROXICET) 5-325 MG tablet Take 1 tablet by mouth 2 (two) times daily as  needed for severe pain. Fill on or after 10/08/18 60 tablet 0  . oxyCODONE-acetaminophen (PERCOCET/ROXICET) 5-325 MG tablet Take 1 tablet by mouth 2 (two) times daily as needed for severe pain. Fill on or after 09/08/18 60 tablet 0  . oxyCODONE-acetaminophen (PERCOCET/ROXICET) 5-325 MG tablet Take 1 tablet by mouth 2 (two) times daily as needed for severe pain. Fill on or after 08/09/18 60 tablet 0  . pantoprazole (PROTONIX) 40 MG tablet TAKE 1 TABLET BY MOUTH DAILY (Patient taking differently: Take 40 mg by mouth daily. ) 30 tablet 5  . QUEtiapine (SEROQUEL) 25 MG tablet Take 25 mg by mouth at bedtime.     Marland Kitchen Respiratory Therapy Supplies (FLUTTER) DEVI Use as directed 1 each 0  . triamcinolone ointment (KENALOG) 0.1 % Apply 1 application topically 2 (two) times daily. (Patient taking differently: Apply 1 application topically 2 (two) times daily as needed. ) 80 g 3  . Vitamin D, Ergocalciferol, (DRISDOL) 1.25 MG (50000 UT) CAPS capsule TAKE ONE CAPSULE BY MOUTH EVERY SEVEN DAYS (Patient taking differently: Take 50,000 Units by mouth every 7 (seven) days. ) 12 capsule 0   No facility-administered medications prior to visit.     ROS: Review of Systems  Constitutional: Positive for fatigue. Negative for activity change, appetite change, chills, diaphoresis, fever and unexpected weight change.  HENT: Negative for congestion, ear pain, facial swelling, hearing loss, mouth sores, nosebleeds, postnasal drip, rhinorrhea, sinus pressure, sneezing, sore throat, tinnitus and trouble swallowing.   Eyes: Negative for pain, discharge, redness, itching and visual disturbance.  Respiratory: Positive for cough. Negative for chest tightness, shortness of breath, wheezing and stridor.   Cardiovascular: Negative for chest pain, palpitations and leg swelling.  Gastrointestinal: Negative for abdominal distention, anal bleeding, blood in stool, constipation, diarrhea, nausea and rectal pain.  Genitourinary: Negative  for difficulty urinating, dysuria, flank pain, frequency, genital sores, hematuria, pelvic pain, urgency, vaginal bleeding and vaginal discharge.  Musculoskeletal: Positive for back pain. Negative for arthralgias, gait problem, joint swelling, neck pain and neck stiffness.  Skin: Negative.  Negative for rash.  Neurological: Negative for dizziness, tremors, seizures, syncope, speech difficulty, weakness, numbness and headaches.  Hematological: Negative for adenopathy. Does not bruise/bleed easily.  Psychiatric/Behavioral: Positive for dysphoric mood. Negative for behavioral problems, decreased concentration, sleep disturbance and suicidal ideas. The patient is not nervous/anxious.     Objective:  BP 124/76 (BP Location: Left Arm, Patient Position: Sitting, Cuff Size: Normal)   Pulse 82   Temp 97.8 F (36.6 C) (Oral)   Ht 5\' 6"  (1.676 m)   Wt 140 lb (63.5 kg)   LMP 10/29/2006 (Approximate)   SpO2 98%   BMI 22.60 kg/m   BP Readings from Last 3 Encounters:  10/17/18 124/76  10/02/18 125/77  08/06/18 128/76    Wt Readings from Last 3 Encounters:  10/17/18 140 lb (63.5 kg)  09/30/18 138 lb 3.7 oz (62.7 kg)  08/06/18 140 lb 9.6 oz (63.8 kg)    Physical Exam Constitutional:      General: She is not in acute distress.    Appearance: She is well-developed.  HENT:     Head: Normocephalic.     Right Ear: External ear normal.     Left Ear: External ear normal.     Nose: Nose normal.  Eyes:  General:        Right eye: No discharge.        Left eye: No discharge.     Conjunctiva/sclera: Conjunctivae normal.     Pupils: Pupils are equal, round, and reactive to [place].  Neck:     Musculoskeletal: Normal range of motion and neck supple.     Thyroid: No thyromegaly.     Vascular: No JVD.     Trachea: No tracheal deviation.  Cardiovascular:     Rate and Rhythm: Normal rate and regular rhythm.     Heart sounds: Normal heart sounds.  Pulmonary:     Effort: No respiratory distress.      Breath sounds: No stridor. No wheezing.  Abdominal:     General: Bowel sounds are normal. There is no distension.     Palpations: Abdomen is soft. There is no mass.     Tenderness: There is no abdominal tenderness. There is no guarding or rebound.  Musculoskeletal:        General: No tenderness.  Lymphadenopathy:     Cervical: No cervical adenopathy.  Skin:    Findings: No erythema or rash.  Neurological:     Cranial Nerves: No cranial nerve deficit.     Motor: No abnormal muscle tone.     Coordination: Coordination normal.     Deep Tendon Reflexes: Reflexes normal.  Psychiatric:        Behavior: Behavior normal.        Thought Content: Thought content normal.        Judgment: Judgment normal.    Stressed w/her son   Lab Results  Component Value Date   WBC 8.7 10/01/2018   HGB 11.8 (L) 10/01/2018   HCT 36.9 10/01/2018   PLT 148 (L) 10/01/2018   GLUCOSE 83 10/02/2018   CHOL 180 05/16/2018   TRIG 67.0 05/16/2018   HDL 69.80 05/16/2018   LDLCALC 96 05/16/2018   ALT 10 09/30/2018   AST 14 (L) 09/30/2018   NA 142 10/02/2018   K 4.0 10/02/2018   CL 111 10/02/2018   CREATININE 0.75 10/02/2018   BUN 14 10/02/2018   CO2 25 10/02/2018   TSH 3.055 10/02/2018    Dg Chest 2 View  Result Date: 09/30/2018 CLINICAL DATA:  Chest pain and cough. EXAM: CHEST - 2 VIEW COMPARISON:  September 09, 2018 FINDINGS: Patchy opacities in the lungs, right greater than left, particularly towards the right base. These findings are worse when compared to the chest x-ray from September 09, 2018. The heart, hila, mediastinum, lungs, and pleura are otherwise unremarkable. IMPRESSION: Patchy bilateral pulmonary opacities, right greater than left. The findings are worsened compared to the November 2019 study and are most consistent with an infectious process. Recommend short-term follow-up to ensure resolution. Electronically Signed   By: Dorise Bullion III M.D   On: 09/30/2018 14:54    Assessment  & Plan:   There are no diagnoses linked to this encounter.   No orders of the defined types were placed in this encounter.    Follow-up: No follow-ups on file.  Walker Kehr, MD

## 2018-11-06 ENCOUNTER — Telehealth: Payer: Self-pay | Admitting: Internal Medicine

## 2018-11-06 NOTE — Telephone Encounter (Signed)
Copied from [place] [id number]. Topic: Medicare AWV >> Nov 06, 2018  3:43 PM Melody [name] wrote: Reason for CRM: Patient called to schedule her AWV on the same day as her appointment with her PCP on 12/23/2018.  Please advise and let patient know if this will be possible.  CB# [phone]

## 2018-11-07 ENCOUNTER — Telehealth: Payer: Self-pay | Admitting: Internal Medicine

## 2018-11-07 ENCOUNTER — Encounter (HOSPITAL_COMMUNITY): Payer: Self-pay

## 2018-11-07 ENCOUNTER — Emergency Department (HOSPITAL_COMMUNITY): Payer: Medicare Other

## 2018-11-07 ENCOUNTER — Inpatient Hospital Stay (HOSPITAL_COMMUNITY)
Admission: EM | Admit: 2018-11-07 | Discharge: 2018-11-10 | DRG: 885 | Disposition: A | Discharge status: Home / Self Care | Payer: Medicare Other | Attending: Family Medicine | Admitting: Family Medicine

## 2018-11-07 ENCOUNTER — Other Ambulatory Visit: Payer: Self-pay

## 2018-11-07 DIAGNOSIS — E034 Atrophy of thyroid (acquired): Secondary | ICD-10-CM | POA: Diagnosis not present

## 2018-11-07 DIAGNOSIS — A310 Pulmonary mycobacterial infection: Secondary | ICD-10-CM | POA: Diagnosis present

## 2018-11-07 DIAGNOSIS — J440 Chronic obstructive pulmonary disease with acute lower respiratory infection: Secondary | ICD-10-CM | POA: Diagnosis not present

## 2018-11-07 DIAGNOSIS — Z881 Allergy status to other antibiotic agents status: Secondary | ICD-10-CM

## 2018-11-07 DIAGNOSIS — K219 Gastro-esophageal reflux disease without esophagitis: Secondary | ICD-10-CM | POA: Diagnosis present

## 2018-11-07 DIAGNOSIS — Z87891 Personal history of nicotine dependence: Secondary | ICD-10-CM

## 2018-11-07 DIAGNOSIS — J189 Pneumonia, unspecified organism: Secondary | ICD-10-CM | POA: Diagnosis present

## 2018-11-07 DIAGNOSIS — E538 Deficiency of other specified B group vitamins: Secondary | ICD-10-CM | POA: Diagnosis not present

## 2018-11-07 DIAGNOSIS — Z7951 Long term (current) use of inhaled steroids: Secondary | ICD-10-CM | POA: Diagnosis not present

## 2018-11-07 DIAGNOSIS — E860 Dehydration: Secondary | ICD-10-CM | POA: Diagnosis not present

## 2018-11-07 DIAGNOSIS — Z888 Allergy status to other drugs, medicaments and biological substances status: Secondary | ICD-10-CM | POA: Diagnosis not present

## 2018-11-07 DIAGNOSIS — E039 Hypothyroidism, unspecified: Secondary | ICD-10-CM | POA: Diagnosis present

## 2018-11-07 DIAGNOSIS — R05 Cough: Secondary | ICD-10-CM | POA: Diagnosis not present

## 2018-11-07 DIAGNOSIS — R110 Nausea: Secondary | ICD-10-CM | POA: Diagnosis not present

## 2018-11-07 DIAGNOSIS — Z8249 Family history of ischemic heart disease and other diseases of the circulatory system: Secondary | ICD-10-CM

## 2018-11-07 DIAGNOSIS — Z885 Allergy status to narcotic agent status: Secondary | ICD-10-CM

## 2018-11-07 DIAGNOSIS — E876 Hypokalemia: Secondary | ICD-10-CM | POA: Diagnosis not present

## 2018-11-07 DIAGNOSIS — M797 Fibromyalgia: Secondary | ICD-10-CM | POA: Diagnosis present

## 2018-11-07 DIAGNOSIS — E559 Vitamin D deficiency, unspecified: Secondary | ICD-10-CM | POA: Diagnosis present

## 2018-11-07 DIAGNOSIS — G8929 Other chronic pain: Secondary | ICD-10-CM | POA: Diagnosis not present

## 2018-11-07 DIAGNOSIS — M545 Low back pain: Secondary | ICD-10-CM | POA: Diagnosis present

## 2018-11-07 DIAGNOSIS — R42 Dizziness and giddiness: Secondary | ICD-10-CM | POA: Diagnosis not present

## 2018-11-07 DIAGNOSIS — R5383 Other fatigue: Secondary | ICD-10-CM | POA: Diagnosis not present

## 2018-11-07 DIAGNOSIS — F319 Bipolar disorder, unspecified: Principal | ICD-10-CM | POA: Diagnosis present

## 2018-11-07 DIAGNOSIS — M1990 Unspecified osteoarthritis, unspecified site: Secondary | ICD-10-CM | POA: Diagnosis not present

## 2018-11-07 DIAGNOSIS — R030 Elevated blood-pressure reading, without diagnosis of hypertension: Secondary | ICD-10-CM | POA: Diagnosis not present

## 2018-11-07 DIAGNOSIS — Z803 Family history of malignant neoplasm of breast: Secondary | ICD-10-CM | POA: Diagnosis not present

## 2018-11-07 DIAGNOSIS — J449 Chronic obstructive pulmonary disease, unspecified: Secondary | ICD-10-CM | POA: Diagnosis not present

## 2018-11-07 DIAGNOSIS — Y95 Nosocomial condition: Secondary | ICD-10-CM | POA: Diagnosis present

## 2018-11-07 DIAGNOSIS — Z90710 Acquired absence of both cervix and uterus: Secondary | ICD-10-CM

## 2018-11-07 DIAGNOSIS — T68XXXA Hypothermia, initial encounter: Secondary | ICD-10-CM | POA: Diagnosis not present

## 2018-11-07 DIAGNOSIS — Z882 Allergy status to sulfonamides status: Secondary | ICD-10-CM

## 2018-11-07 DIAGNOSIS — Z79890 Hormone replacement therapy (postmenopausal): Secondary | ICD-10-CM

## 2018-11-07 DIAGNOSIS — I10 Essential (primary) hypertension: Secondary | ICD-10-CM | POA: Diagnosis not present

## 2018-11-07 DIAGNOSIS — R509 Fever, unspecified: Secondary | ICD-10-CM | POA: Diagnosis not present

## 2018-11-07 MED ORDER — KETOROLAC TROMETHAMINE 30 MG/ML IJ SOLN
30.0000 mg | Freq: Once | INTRAMUSCULAR | Status: AC
  Administered 2018-11-07: 30 mg via INTRAVENOUS
  Filled 2018-11-07: qty 1

## 2018-11-07 MED ORDER — ALBUTEROL SULFATE (2.5 MG/3ML) 0.083% IN NEBU
2.5000 mg | INHALATION_SOLUTION | Freq: Once | RESPIRATORY_TRACT | Status: AC
  Administered 2018-11-08: 2.5 mg via RESPIRATORY_TRACT
  Filled 2018-11-07: qty 3

## 2018-11-07 MED ORDER — IPRATROPIUM-ALBUTEROL 0.5-2.5 (3) MG/3ML IN SOLN
3.0000 mL | Freq: Once | RESPIRATORY_TRACT | Status: AC
  Administered 2018-11-08: 3 mL via RESPIRATORY_TRACT
  Filled 2018-11-07: qty 3

## 2018-11-07 MED ORDER — SODIUM CHLORIDE 0.9 % IV BOLUS (SEPSIS)
1000.0000 mL | Freq: Once | INTRAVENOUS | Status: AC
  Administered 2018-11-07: 1000 mL via INTRAVENOUS

## 2018-11-07 MED ORDER — ALBUTEROL SULFATE (2.5 MG/3ML) 0.083% IN NEBU: 5.0000 mg | INHALATION_SOLUTION | Freq: Once | RESPIRATORY_TRACT | Status: DC

## 2018-11-07 MED ORDER — IPRATROPIUM BROMIDE 0.02 % IN SOLN: 0.5000 mg | Freq: Once | RESPIRATORY_TRACT | Status: DC

## 2018-11-07 NOTE — ED Triage Notes (Addendum)
Pt reports having PNA and upper respiratory infection recently. Pt has completed antibiotics and reports her son has "been sick and thinks she got it again" from him. Pt was given Tylenol by EMS in route. EMS reported AMS and inability to state time and date.  Pt highest temp reported was 103.8 at home. Pt is alert and oriented x 4 at this time. Pt stated her name, birthday, date, where she is and what city, and current president.

## 2018-11-07 NOTE — Telephone Encounter (Signed)
Spoke with Melody [name] today. She stated that she is feeling sick, she is coughing up green mucus, and that she feels cold. She would like to know if Dr. Camila Li can prescribe her some antibiotics. She would also like for someone to give her a call if possible. SF

## 2018-11-08 ENCOUNTER — Encounter (HOSPITAL_COMMUNITY): Payer: Self-pay | Admitting: Family Medicine

## 2018-11-08 DIAGNOSIS — Y95 Nosocomial condition: Secondary | ICD-10-CM | POA: Diagnosis present

## 2018-11-08 DIAGNOSIS — Z885 Allergy status to narcotic agent status: Secondary | ICD-10-CM | POA: Diagnosis not present

## 2018-11-08 DIAGNOSIS — R5383 Other fatigue: Secondary | ICD-10-CM | POA: Diagnosis not present

## 2018-11-08 DIAGNOSIS — E876 Hypokalemia: Secondary | ICD-10-CM

## 2018-11-08 DIAGNOSIS — E559 Vitamin D deficiency, unspecified: Secondary | ICD-10-CM | POA: Diagnosis present

## 2018-11-08 DIAGNOSIS — Z803 Family history of malignant neoplasm of breast: Secondary | ICD-10-CM | POA: Diagnosis not present

## 2018-11-08 DIAGNOSIS — E039 Hypothyroidism, unspecified: Secondary | ICD-10-CM | POA: Diagnosis present

## 2018-11-08 DIAGNOSIS — Z881 Allergy status to other antibiotic agents status: Secondary | ICD-10-CM | POA: Diagnosis not present

## 2018-11-08 DIAGNOSIS — M545 Low back pain: Secondary | ICD-10-CM | POA: Diagnosis present

## 2018-11-08 DIAGNOSIS — J440 Chronic obstructive pulmonary disease with acute lower respiratory infection: Secondary | ICD-10-CM | POA: Diagnosis present

## 2018-11-08 DIAGNOSIS — M1990 Unspecified osteoarthritis, unspecified site: Secondary | ICD-10-CM | POA: Diagnosis present

## 2018-11-08 DIAGNOSIS — E538 Deficiency of other specified B group vitamins: Secondary | ICD-10-CM | POA: Diagnosis present

## 2018-11-08 DIAGNOSIS — G8929 Other chronic pain: Secondary | ICD-10-CM | POA: Diagnosis present

## 2018-11-08 DIAGNOSIS — Z90710 Acquired absence of both cervix and uterus: Secondary | ICD-10-CM | POA: Diagnosis not present

## 2018-11-08 DIAGNOSIS — Z8249 Family history of ischemic heart disease and other diseases of the circulatory system: Secondary | ICD-10-CM | POA: Diagnosis not present

## 2018-11-08 DIAGNOSIS — Z7951 Long term (current) use of inhaled steroids: Secondary | ICD-10-CM | POA: Diagnosis not present

## 2018-11-08 DIAGNOSIS — Z888 Allergy status to other drugs, medicaments and biological substances status: Secondary | ICD-10-CM | POA: Diagnosis not present

## 2018-11-08 DIAGNOSIS — J189 Pneumonia, unspecified organism: Secondary | ICD-10-CM | POA: Diagnosis not present

## 2018-11-08 DIAGNOSIS — R05 Cough: Secondary | ICD-10-CM | POA: Diagnosis not present

## 2018-11-08 DIAGNOSIS — Z87891 Personal history of nicotine dependence: Secondary | ICD-10-CM | POA: Diagnosis not present

## 2018-11-08 DIAGNOSIS — K219 Gastro-esophageal reflux disease without esophagitis: Secondary | ICD-10-CM | POA: Diagnosis not present

## 2018-11-08 DIAGNOSIS — J449 Chronic obstructive pulmonary disease, unspecified: Secondary | ICD-10-CM | POA: Diagnosis not present

## 2018-11-08 DIAGNOSIS — Z79890 Hormone replacement therapy (postmenopausal): Secondary | ICD-10-CM | POA: Diagnosis not present

## 2018-11-08 DIAGNOSIS — Z882 Allergy status to sulfonamides status: Secondary | ICD-10-CM | POA: Diagnosis not present

## 2018-11-08 DIAGNOSIS — F319 Bipolar disorder, unspecified: Secondary | ICD-10-CM | POA: Diagnosis present

## 2018-11-08 DIAGNOSIS — M797 Fibromyalgia: Secondary | ICD-10-CM | POA: Diagnosis present

## 2018-11-08 DIAGNOSIS — A310 Pulmonary mycobacterial infection: Secondary | ICD-10-CM | POA: Diagnosis present

## 2018-11-08 LAB — COMPREHENSIVE METABOLIC PANEL
ALT: 9 U/L (ref 0–44)
AST: 13 U/L — ABNORMAL LOW (ref 15–41)
Albumin: 3.2 g/dL — ABNORMAL LOW (ref 3.5–5.0)
Alkaline Phosphatase: 49 U/L (ref 38–126)
Anion gap: 6 (ref 5–15)
BUN: 10 mg/dL (ref 8–23)
CO2: 24 mmol/L (ref 22–32)
Calcium: 8.4 mg/dL — ABNORMAL LOW (ref 8.9–10.3)
Chloride: 112 mmol/L — ABNORMAL HIGH (ref 98–111)
Creatinine, Ser: 0.7 mg/dL (ref 0.44–1.00)
GFR calc Af Amer: >60 mL/min (ref >60)
GFR calc non Af Amer: >60 mL/min (ref >60)
Glucose, Bld: 95 mg/dL (ref 70–99)
POTASSIUM: 3.7 mmol/L (ref 3.5–5.1)
Sodium: 142 mmol/L (ref 135–145)
Total Bilirubin: 0.8 mg/dL (ref 0.3–1.2)
Total Protein: 6 g/dL — ABNORMAL LOW (ref 6.5–8.1)

## 2018-11-08 LAB — CBC WITH DIFFERENTIAL/PLATELET
Abs Immature Granulocytes: 0.03 10*3/uL (ref 0.00–0.07)
BASOS PCT: 0 %
Basophils Absolute: 0 10*3/uL (ref 0.0–0.1)
EOS ABS: 0 10*3/uL (ref 0.0–0.5)
Eosinophils Relative: 0 %
HCT: 36.7 % (ref 36.0–46.0)
Hemoglobin: 11.8 g/dL — ABNORMAL LOW (ref 12.0–15.0)
Immature Granulocytes: 0 %
Lymphocytes Relative: 10 %
Lymphs Abs: 1.1 10*3/uL (ref 0.7–4.0)
MCH: 28.6 pg (ref 26.0–34.0)
MCHC: 32.2 g/dL (ref 30.0–36.0)
MCV: 88.9 fL (ref 80.0–100.0)
Monocytes Absolute: 0.6 10*3/uL (ref 0.1–1.0)
Monocytes Relative: 6 %
Neutro Abs: 9.1 10*3/uL — ABNORMAL HIGH (ref 1.7–7.7)
Neutrophils Relative %: 84 %
Platelets: 142 10*3/uL — ABNORMAL LOW (ref 150–400)
RBC: 4.13 MIL/uL (ref 3.87–5.11)
RDW: 12.9 % (ref 11.5–15.5)
WBC: 10.9 10*3/uL — ABNORMAL HIGH (ref 4.0–10.5)
nRBC: 0 % (ref 0.0–0.2)

## 2018-11-08 LAB — BASIC METABOLIC PANEL
Anion gap: 10 (ref 5–15)
BUN: 12 mg/dL (ref 8–23)
CO2: 22 mmol/L (ref 22–32)
Calcium: 8.7 mg/dL — ABNORMAL LOW (ref 8.9–10.3)
Chloride: 106 mmol/L (ref 98–111)
Creatinine, Ser: 0.81 mg/dL (ref 0.44–1.00)
GFR calc Af Amer: >60 mL/min (ref >60)
GFR calc non Af Amer: >60 mL/min (ref >60)
GLUCOSE: 118 mg/dL — AB (ref 70–99)
Potassium: 2.7 mmol/L — CL (ref 3.5–5.1)
Sodium: 138 mmol/L (ref 135–145)

## 2018-11-08 LAB — INFLUENZA PANEL BY PCR (TYPE A & B)
Influenza A By PCR: NEGATIVE
Influenza B By PCR: NEGATIVE

## 2018-11-08 LAB — MAGNESIUM: Magnesium: 2.1 mg/dL (ref 1.7–2.4)

## 2018-11-08 LAB — CBC
HCT: 34.8 % — ABNORMAL LOW (ref 36.0–46.0)
Hemoglobin: 11.2 g/dL — ABNORMAL LOW (ref 12.0–15.0)
MCH: 29.1 pg (ref 26.0–34.0)
MCHC: 32.2 g/dL (ref 30.0–36.0)
MCV: 90.4 fL (ref 80.0–100.0)
NRBC: 0 % (ref 0.0–0.2)
PLATELETS: 133 10*3/uL — AB (ref 150–400)
RBC: 3.85 MIL/uL — ABNORMAL LOW (ref 3.87–5.11)
RDW: 13.1 % (ref 11.5–15.5)
WBC: 9.3 10*3/uL (ref 4.0–10.5)

## 2018-11-08 MED ORDER — POTASSIUM CHLORIDE CRYS ER 20 MEQ PO TBCR
40.0000 meq | EXTENDED_RELEASE_TABLET | Freq: Once | ORAL | Status: AC
  Administered 2018-11-08: 40 meq via ORAL
  Filled 2018-11-08: qty 2

## 2018-11-08 MED ORDER — FLUTICASONE FUROATE-VILANTEROL 100-25 MCG/INH IN AEPB
1.0000 | INHALATION_SPRAY | Freq: Every day | RESPIRATORY_TRACT | Status: DC
  Administered 2018-11-08 – 2018-11-10 (×3): 1 via RESPIRATORY_TRACT
  Filled 2018-11-08: qty 28

## 2018-11-08 MED ORDER — LEVOFLOXACIN IN D5W 750 MG/150ML IV SOLN
750.0000 mg | Freq: Once | INTRAVENOUS | Status: AC
  Administered 2018-11-08: 750 mg via INTRAVENOUS
  Filled 2018-11-08: qty 150

## 2018-11-08 MED ORDER — ONDANSETRON HCL 4 MG/2ML IJ SOLN: 4.0000 mg | Freq: Four times a day (QID) | INTRAMUSCULAR | Status: DC | PRN

## 2018-11-08 MED ORDER — METHYLPHENIDATE HCL 5 MG PO TABS
20.0000 mg | ORAL_TABLET | Freq: Every day | ORAL | Status: DC
  Administered 2018-11-09 – 2018-11-10 (×2): 20 mg via ORAL
  Filled 2018-11-08 (×2): qty 4

## 2018-11-08 MED ORDER — LEVOTHYROXINE SODIUM 50 MCG PO TABS
50.0000 ug | ORAL_TABLET | Freq: Every day | ORAL | Status: DC
  Administered 2018-11-08 – 2018-11-10 (×3): 50 ug via ORAL
  Filled 2018-11-08 (×3): qty 1

## 2018-11-08 MED ORDER — ALPRAZOLAM 1 MG PO TABS
1.0000 mg | ORAL_TABLET | Freq: Four times a day (QID) | ORAL | Status: DC
  Administered 2018-11-08 (×2): 1 mg via ORAL
  Filled 2018-11-08 (×2): qty 2

## 2018-11-08 MED ORDER — POTASSIUM CHLORIDE 10 MEQ/100ML IV SOLN
10.0000 meq | Freq: Once | INTRAVENOUS | Status: AC
  Administered 2018-11-08: 10 meq via INTRAVENOUS
  Filled 2018-11-08: qty 100

## 2018-11-08 MED ORDER — LORATADINE 10 MG PO TABS
10.0000 mg | ORAL_TABLET | Freq: Every day | ORAL | Status: DC
  Administered 2018-11-08 – 2018-11-10 (×3): 10 mg via ORAL
  Filled 2018-11-08 (×3): qty 1

## 2018-11-08 MED ORDER — LEVOCETIRIZINE DIHYDROCHLORIDE 5 MG PO TABS: 5.0000 mg | ORAL_TABLET | Freq: Every evening | ORAL | Status: DC

## 2018-11-08 MED ORDER — METHYLPHENIDATE HCL 5 MG PO TABS
20.0000 mg | ORAL_TABLET | Freq: Two times a day (BID) | ORAL | Status: DC
  Administered 2018-11-08: 20 mg via ORAL
  Filled 2018-11-08: qty 4

## 2018-11-08 MED ORDER — LEVOFLOXACIN IN D5W 750 MG/150ML IV SOLN
750.0000 mg | INTRAVENOUS | Status: DC
  Administered 2018-11-09 – 2018-11-10 (×2): 750 mg via INTRAVENOUS
  Filled 2018-11-08 (×2): qty 150

## 2018-11-08 MED ORDER — OXYCODONE-ACETAMINOPHEN 5-325 MG PO TABS
1.0000 | ORAL_TABLET | Freq: Two times a day (BID) | ORAL | Status: DC | PRN
  Administered 2018-11-08 – 2018-11-09 (×3): 1 via ORAL
  Filled 2018-11-08 (×6): qty 1

## 2018-11-08 MED ORDER — QUETIAPINE FUMARATE 25 MG PO TABS
25.0000 mg | ORAL_TABLET | Freq: Every day | ORAL | Status: DC
  Administered 2018-11-08 – 2018-11-09 (×2): 25 mg via ORAL
  Filled 2018-11-08 (×2): qty 1

## 2018-11-08 MED ORDER — PANTOPRAZOLE SODIUM 40 MG PO TBEC
40.0000 mg | DELAYED_RELEASE_TABLET | Freq: Every day | ORAL | Status: DC
  Administered 2018-11-08 – 2018-11-09 (×2): 40 mg via ORAL
  Filled 2018-11-08 (×3): qty 1

## 2018-11-08 MED ORDER — ENOXAPARIN SODIUM 40 MG/0.4ML ~~LOC~~ SOLN
40.0000 mg | SUBCUTANEOUS | Status: DC
  Administered 2018-11-08 – 2018-11-10 (×3): 40 mg via SUBCUTANEOUS
  Filled 2018-11-08 (×3): qty 0.4

## 2018-11-08 MED ORDER — ONDANSETRON HCL 4 MG PO TABS: 4.0000 mg | ORAL_TABLET | Freq: Four times a day (QID) | ORAL | Status: DC | PRN

## 2018-11-08 MED ORDER — POTASSIUM CHLORIDE 10 MEQ/100ML IV SOLN
10.0000 meq | INTRAVENOUS | Status: AC
  Administered 2018-11-08 (×3): 10 meq via INTRAVENOUS
  Filled 2018-11-08 (×3): qty 100

## 2018-11-08 MED ORDER — GUAIFENESIN ER 600 MG PO TB12
1200.0000 mg | ORAL_TABLET | Freq: Two times a day (BID) | ORAL | Status: DC
  Administered 2018-11-08 – 2018-11-10 (×5): 1200 mg via ORAL
  Filled 2018-11-08 (×9): qty 2

## 2018-11-08 MED ORDER — IPRATROPIUM BROMIDE 0.02 % IN SOLN: 0.5000 mg | Freq: Four times a day (QID) | RESPIRATORY_TRACT | Status: DC

## 2018-11-08 MED ORDER — IPRATROPIUM-ALBUTEROL 0.5-2.5 (3) MG/3ML IN SOLN
3.0000 mL | Freq: Four times a day (QID) | RESPIRATORY_TRACT | Status: DC
  Administered 2018-11-08 (×3): 3 mL via RESPIRATORY_TRACT
  Filled 2018-11-08 (×3): qty 3

## 2018-11-08 MED ORDER — ALPRAZOLAM 1 MG PO TABS
1.0000 mg | ORAL_TABLET | Freq: Three times a day (TID) | ORAL | Status: DC | PRN
  Administered 2018-11-08 – 2018-11-10 (×6): 1 mg via ORAL
  Filled 2018-11-08 (×6): qty 1

## 2018-11-08 MED ORDER — SODIUM CHLORIDE 0.9 % IV SOLN
INTRAVENOUS | Status: DC
  Administered 2018-11-08 – 2018-11-10 (×5): via INTRAVENOUS

## 2018-11-08 MED ORDER — BENZONATATE 100 MG PO CAPS
200.0000 mg | ORAL_CAPSULE | Freq: Three times a day (TID) | ORAL | Status: DC | PRN
  Administered 2018-11-09: 200 mg via ORAL
  Filled 2018-11-08: qty 2

## 2018-11-08 MED ORDER — METHYLPREDNISOLONE SODIUM SUCC 125 MG IJ SOLR
60.0000 mg | Freq: Four times a day (QID) | INTRAMUSCULAR | Status: DC
  Administered 2018-11-08 – 2018-11-10 (×10): 60 mg via INTRAVENOUS
  Filled 2018-11-08 (×10): qty 2

## 2018-11-08 MED ORDER — ALBUTEROL SULFATE (2.5 MG/3ML) 0.083% IN NEBU: 2.5000 mg | INHALATION_SOLUTION | Freq: Four times a day (QID) | RESPIRATORY_TRACT | Status: DC

## 2018-11-08 NOTE — Progress Notes (Signed)
PROGRESS NOTE    Melody [name]  IWP:[id number]  DOB: 1956/04/14  DOA: 11/07/2018 PCP: Cassandria Anger, MD   Brief Admission Hx: 63 y.o. female, with history of bronchiectasis, depression, GERD, hypothyroidism, MAI came to hospital with complaint of shortness of breath, cough.  MDM/Assessment & Plan:     1. Healthcare associated pneumonia-given that she was hospitalized last month, seen on chest x-ray, patient started on IV Levaquin.  If no improvement would broaden coverage.  Continue levofloxacin for now.  Follow blood culture results.  Influenza panel is negative  2. Bronchiectasis/COPD-continue DuoNebs every 6 hours, Solu-Medrol 60 mg IV every 6 hours, Mucinex 1200 mg p.o. twice daily  3. Hypokalemia-repleted, follow BMP in a.m.  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  5. Hypothyroidism-continue Synthroid  DVT prophylaxis: lovenox  Code Status: Full  Family Communication: patient  Disposition Plan: Home when medically stabilized    Consultants:  N/a   Procedures:  n/a  Antimicrobials:  Levofloxacin 1/11 >   Subjective: Pt says she feels very weak and tired.  She has a persistent cough.   Objective: Vitals:   11/08/18 0700 11/08/18 0800 11/08/18 0830 11/08/18 0900  BP: 115/62 (!) 101/53 (!) 111/54 109/65  Pulse: 74 72 71 77  Resp: 20  (!) 22 19  Temp:      TempSrc:      SpO2: 95% 94% 96% 94%  Weight:      Height:       No intake or output data in the 24 hours ending 11/08/18 0927 Filed Weights   11/07/18 2311  Weight: 62.6 kg     REVIEW OF SYSTEMS  As per history otherwise all reviewed and reported negative  Exam:  General exam: chronically ill appearing, lying in bed, no apparent distress.  Respiratory system: diffuse rales heard. No increased work of breathing. Cardiovascular system: S1 & S2 heard, RRR. No JVD, murmurs, gallops, clicks or pedal edema. Gastrointestinal system: Abdomen is  nondistended, soft and nontender. Normal bowel sounds heard. Central nervous system: Alert and oriented. No focal neurological deficits. Extremities: no CCE.  Data Reviewed: Basic Metabolic Panel: Recent Labs  Lab 11/08/18 0049 11/08/18 0521  NA 138 142  K 2.7* 3.7  CL 106 112*  CO2 22 24  GLUCOSE 118* 95  BUN 12 10  CREATININE 0.81 0.70  CALCIUM 8.7* 8.4*  MG 2.1  --    Liver Function Tests: Recent Labs  Lab 11/08/18 0521  AST 13*  ALT 9  ALKPHOS 49  BILITOT 0.8  PROT 6.0*  ALBUMIN 3.2*   No results for input(s): LIPASE, AMYLASE in the last 168 hours. No results for input(s): AMMONIA in the last 168 hours. CBC: Recent Labs  Lab 11/08/18 0049 11/08/18 0521  WBC 10.9* 9.3  NEUTROABS 9.1*  --   HGB 11.8* 11.2*  HCT 36.7 34.8*  MCV 88.9 90.4  PLT 142* 133*   Cardiac Enzymes: No results for input(s): CKTOTAL, CKMB, CKMBINDEX, TROPONINI in the last 168 hours. CBG (last 3)  No results for input(s): GLUCAP in the last 72 hours. Recent Results (from the past 240 hour(s))  Culture, blood (Routine X 2) w Reflex to ID Panel     Status: None (Preliminary result)   Collection Time: 11/08/18  5:21 AM  Result Value Ref Range Status   Specimen Description   Final    LEFT ANTECUBITAL BOTTLES DRAWN AEROBIC AND ANAEROBIC   Special Requests   Final    Blood  Culture adequate volume Performed at [facility], [address] [place], [place] [address]    Culture PENDING  Incomplete   Report Status PENDING  Incomplete  Culture, blood (Routine X 2) w Reflex to ID Panel     Status: None (Preliminary result)   Collection Time: 11/08/18  5:28 AM  Result Value Ref Range Status   Specimen Description   Final    BLOOD LEFT HAND BOTTLES DRAWN AEROBIC AND ANAEROBIC   Special Requests   Final    Blood Culture adequate volume Performed at [facility], [address] [place], [place] [address]    Culture PENDING  Incomplete   Report Status PENDING  Incomplete     Studies: Dg  Chest 2 View  Result Date: 11/08/2018 CLINICAL DATA:  Cough EXAM: CHEST - 2 VIEW COMPARISON:  09/30/2018, 07/03/2017, CT 07/22/2017 FINDINGS: Increased bilateral interstitial opacity with mild nodularity on the right. No consolidation or effusion. Normal cardiomediastinal silhouette. No pneumothorax. IMPRESSION: Diffusely increased interstitial opacity with mild nodularity on the right, suspect for acute interstitial inflammatory or infectious process superimposed on underlying chronic changes. Electronically Signed   By: Donavan Foil M.D.   On: 11/08/2018 00:18   Scheduled Meds: . ALPRAZolam  1 mg Oral QID  . enoxaparin (LOVENOX) injection  40 mg Subcutaneous Q24H  . fluticasone furoate-vilanterol  1 puff Inhalation Daily  . guaiFENesin  1,200 mg Oral BID  . ipratropium-albuterol  3 mL Nebulization Q6H  . levothyroxine  50 mcg Oral QAC breakfast  . loratadine  10 mg Oral Daily  . methylphenidate  20 mg Oral BID  . methylPREDNISolone (SOLU-MEDROL) injection  60 mg Intravenous Q6H  . pantoprazole  40 mg Oral Daily  . QUEtiapine  25 mg Oral QHS   Continuous Infusions: . sodium chloride 125 mL/hr at 11/08/18 0521  . [START ON 11/09/2018] levofloxacin (LEVAQUIN) IV    . potassium chloride 10 mEq (11/08/18 5361)    Active Problems:   CAP (community acquired pneumonia)   Time spent:   Irwin Brakeman, MD [facility]  If 7PM-7AM, please contact night-coverage [email or web address] Password [email or web address] 11/08/2018, 9:27 AM    LOS: 0 days

## 2018-11-08 NOTE — ED Provider Notes (Signed)
[facility] EMERGENCY DEPARTMENT Provider Note   CSN: [id number] Arrival date & time: 11/07/18  2249     History   Chief Complaint Chief Complaint  Patient presents with  . Fever    cough  . Generalized Body Aches    HPI Melody [name] is a 63 y.o. female.  The history is provided by the patient.  Cough  Cough characteristics:  Productive Sputum characteristics:  Yellow and green Severity:  Moderate Onset quality:  Gradual Timing:  Intermittent Progression:  Worsening Chronicity:  Recurrent Smoker: no   Relieved by:  Nothing Worsened by:  Nothing Associated symptoms: chills, fever, headaches and shortness of breath    Patient with history of anxiety, bronchiectasis, chronic pain presents with fever and cough.  This has been ongoing for the past several days.  She reports multiple bouts of pneumonia earlier in the fall and was admitted in December with some improvement.  The symptoms started several days ago  she reports cough, fever, shortness of breath.  She has tried albuterol without improvement.  She is not on home oxygen. Past Medical History:  Diagnosis Date  . Anxiety   . Bronchiectasis ([place])   . Chronic back pain   . Chronic neck pain   . Depression    bipolar- Dr [name]  . Fatigue   . Fibromyalgia   . GERD (gastroesophageal reflux disease)   . Headache   . Hypothyroidism   . Low back pain    Dr Arnoldo Morale  . 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([place])   . Osteoarthritis   . Pneumonia   . Pulmonary nodule   . Sinus congestion   . Thyroid disease    hypothyroidsm  . Vertigo   . Vitamin B12 deficiency     Patient Active Problem List   Diagnosis Date Noted  . Lobar pneumonia ([place]) 10/01/2018  . Thrombocytopenia ([place]) 10/01/2018  . Pneumonia 09/30/2018  . Meteorism 05/16/2018  . Emotional stress 12/25/2017  . 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([place]) 10/16/2017  . Bursitis 05/21/2017  . Knee pain, chronic 05/21/2017  . Cough 04/04/2017    . Multiple lung nodules on CT 04/04/2017  . Change in voice 04/04/2017  . Fatigue 02/08/2016  . Complete uterovaginal prolapse 01/10/2016  . Status post total abdominal hysterectomy and bilateral salpingo-oophorectomy 01/10/2016  . Urinary incontinence 11/09/2015  . Vaginal discomfort 08/10/2015  . Rectocele 08/10/2015  . Well adult exam 09/03/2014  . Chronic obstructive airway disease with asthma ([place]) 09/03/2014  . UTI (urinary tract infection) 06/09/2014  . Dysplastic toenail 09/02/2013  . Aphthous ulcer 06/11/2013  . Dark urine 03/18/2013  . Rash 06/08/2012  . Contact dermatitis 04/13/2011  . PRURITUS 12/01/2010  . EXPOSURE TO HAZARDOUS BODY FLUID, HX OF 05/29/2010  . HIP PAIN 12/05/2009  . RUQ PAIN 02/04/2009  . WRIST PAIN 05/24/2008  . CERVICAL STRAIN 05/24/2008  . WARTS, VIRAL, UNSPECIFIED 04/06/2008  . NEOPLASM, SKIN, UNCERTAIN BEHAVIOR 15/40/0867  . Vitamin D deficiency 11/13/2007  . Hypothyroidism 08/05/2007  . Bipolar I disorder ([place]) 08/05/2007  . GERD 08/05/2007  . Anxiety state 07/23/2007  . Low back pain 07/23/2007  . Myalgia and myositis 07/23/2007  . B12 deficiency 05/22/2007  . IRON DEFICIENCY 05/22/2007    Past Surgical History:  Procedure Laterality Date  . ABDOMINAL HYSTERECTOMY N/A 01/10/2016   Procedure: HYSTERECTOMY ABDOMINAL;  Surgeon: Nunzio Cobbs, MD;  Location: [facility];  Service: Gynecology;  Laterality: N/A;  . ABDOMINAL SACROCOLPOPEXY N/A 01/10/2016   Procedure: ABDOMINO SACROCOLPOPEXY ;  Surgeon: Nunzio Cobbs, MD;  Location: [facility];  Service: Gynecology;  Laterality: N/A;  . ANTERIOR AND POSTERIOR REPAIR N/A 01/10/2016   Procedure:  POSTERIOR REPAIR (RECTOCELE);  Surgeon: Nunzio Cobbs, MD;  Location: [facility];  Service: Gynecology;  Laterality: N/A;  . BLADDER SUSPENSION N/A 01/10/2016   Procedure: TRANSVAGINAL TAPE (TVT) PROCEDURE exact midurethral sling;  Surgeon: Nunzio Cobbs, MD;  Location: [facility];  Service: Gynecology;  Laterality: N/A;  . CERVICAL LAMINECTOMY  2001 and 2995   Dr Arnoldo Morale  . CYSTO N/A 01/10/2016   Procedure: CYSTOSCOPY;  Surgeon: Nunzio Cobbs, MD;  Location: [facility];  Service: Gynecology;  Laterality: N/A;  . SALPINGOOPHORECTOMY Bilateral 01/10/2016   Procedure: BILATERAL SALPINGO OOPHORECTOMY;  Surgeon: Nunzio Cobbs, MD;  Location: [facility];  Service: Gynecology;  Laterality: Bilateral;  . TUBAL LIGATION  1980     OB History    Gravida  2   Para  2   Term  2   Preterm      AB      Living  2     SAB      TAB      Ectopic      Multiple      Live Births               Home Medications    Prior to Admission medications   Medication Sig Start Date End Date Taking? Authorizing Provider  albuterol (PROVENTIL HFA;VENTOLIN HFA) 108 (90 Base) MCG/ACT inhaler Inhale 2 puffs into the lungs every 6 (six) hours as needed for wheezing or shortness of breath. 09/24/18   Juanito Doom, MD  ALPRAZolam Duanne Moron) 1 MG tablet Take 1 mg by mouth 4 (four) times daily.     [provider]  Azelastine-Fluticasone (DYMISTA) 137-50 MCG/ACT SUSP Place 1 spray 2 (two) times daily into the nose. 09/12/17   Javier Glazier, MD  benzonatate (TESSALON) 200 MG capsule Take 200 mg by mouth every 8 (eight) hours as needed for cough.  09/09/18   [provider]  BREO ELLIPTA 100-25 MCG/INH AEPB inhale ONE PUFF into THE lungs DAILY 01/20/18   Plotnikov, Evie Lacks, MD  cyanocobalamin (,VITAMIN B-12,) 1000 MCG/ML injection INJECT 1 ML INTO THE MUSCLE EVERY 14 DAYS. Patient taking differently: 1,000 mcg every 14 (fourteen) days.  07/21/18   Plotnikov, Evie Lacks, MD  fluticasone (FLONASE) 50 MCG/ACT nasal spray USE 2 SPRAYS IN BOTH NOSTRILS DAILY. Patient taking differently: Place 2 sprays into both nostrils daily.  02/28/18   Plotnikov, Evie Lacks, MD  ketorolac (TORADOL) 10 MG tablet Take 10 mg by mouth every 6 (six) hours as needed. for  pain 09/09/18   [provider]  levocetirizine (XYZAL) 5 MG tablet TAKE 1 TABLET BY MOUTH EVERY EVENING. Patient taking differently: Take 5 mg by mouth every evening.  06/26/18   Plotnikov, Evie Lacks, MD  levofloxacin (LEVAQUIN) 750 MG tablet Take 1 tablet (750 mg total) by mouth daily. 10/03/18   Orson Eva, MD  levothyroxine (SYNTHROID, LEVOTHROID) 50 MCG tablet TAKE ONE TABLET BY MOUTH DAILY BEFORE breakfast Patient taking differently: Take 50 mcg by mouth daily before breakfast.  02/11/18   Plotnikov, Evie Lacks, MD  methylphenidate (RITALIN) 20 MG tablet Take 20 mg by mouth 2 (two) times daily.  10/03/17   [provider]  oxyCODONE-acetaminophen (PERCOCET/ROXICET) 5-325 MG tablet Take 1 tablet by mouth 2 (two)  times daily as needed for severe pain. Fill on or after 12/09/18 10/17/18   Plotnikov, Evie Lacks, MD  pantoprazole (PROTONIX) 40 MG tablet TAKE 1 TABLET BY MOUTH DAILY Patient taking differently: Take 40 mg by mouth daily.  06/26/18   Plotnikov, Evie Lacks, MD  QUEtiapine (SEROQUEL) 25 MG tablet Take 25 mg by mouth at bedtime.     [provider]  Respiratory Therapy Supplies (FLUTTER) DEVI Use as directed 05/21/17   Javier Glazier, MD  triamcinolone ointment (KENALOG) 0.1 % Apply 1 application topically 2 (two) times daily. Patient taking differently: Apply 1 application topically 2 (two) times daily as needed.  05/11/18   Plotnikov, Evie Lacks, MD  Vitamin D, Ergocalciferol, (DRISDOL) 1.25 MG (50000 UT) CAPS capsule TAKE ONE CAPSULE BY MOUTH EVERY SEVEN DAYS Patient taking differently: Take 50,000 Units by mouth every 7 (seven) days.  09/16/18   Lyndal Pulley, DO    Family History Family History  Problem Relation Age of Onset  . Heart attack Father        dec heart disease age 2  . Hypertension Father   . Lung disease Father   . Cancer Mother        dec stomach ca--age 33  . Breast cancer Cousin   . Arthritis Other   . Breast cancer Paternal Aunt       Social History Social History   Tobacco Use  . Smoking status: Former Smoker    Packs/day: 1.00    Years: 35.00    Pack years: 35.00    Types: Cigarettes    Start date: 11/21/1962    Last attempt to quit: 05/06/2011    Years since quitting: 7.5  . Smokeless tobacco: Never Used  . Tobacco comment: stopped periodically - peak rate of 1 ppd  Substance Use Topics  . Alcohol use: No    Alcohol/week: 0.0 standard drinks  . Drug use: No     Allergies   Flagyl [metronidazole]; Doxycycline hyclate; Gabapentin; Nabumetone; Sulfa antibiotics; Tetracycline hcl; and Tramadol hcl   Review of Systems Review of Systems  Constitutional: Positive for chills and fever.  Respiratory: Positive for cough and shortness of breath.   Neurological: Positive for headaches.  All other systems reviewed and are negative.    Physical Exam Updated Vital Signs BP 135/68 (BP Location: Left Arm)   Pulse 96   Temp (!) 100.5 F (38.1 C) (Oral)   Resp 17   Ht 1.676 m (5\' 6" )   Wt 62.6 kg   LMP 10/29/2006 (Approximate)   SpO2 96%   BMI 22.27 kg/m   Physical Exam CONSTITUTIONAL: Well developed/well nourished HEAD: Normocephalic/atraumatic EYES: EOMI/PERRL ENMT: Mucous membranes moist, uvula midline without erythema or exudates NECK: supple no meningeal signs SPINE/BACK:entire spine nontender CV: S1/S2 noted, no murmurs/rubs/gallops noted LUNGS: Coarse breath sounds bilaterally with scattered wheeze ABDOMEN: soft, nontender, no rebound or guarding, bowel sounds noted throughout abdomen GU:no cva tenderness NEURO: Pt is awake/alert/appropriate, moves all extremitiesx4.  No facial droop.   EXTREMITIES: pulses normal/equal, full ROM SKIN: warm, color normal PSYCH: no abnormalities of mood noted, alert and oriented to situation   ED Treatments / Results  Labs (all labs ordered are listed, but only abnormal results are displayed) Labs Reviewed  BASIC METABOLIC PANEL - Abnormal; Notable for  the following components:      Result Value   Potassium 2.7 (*)    Glucose, Bld 118 (*)    Calcium 8.7 (*)  All other components within normal limits  CBC WITH DIFFERENTIAL/PLATELET - Abnormal; Notable for the following components:   WBC 10.9 (*)    Hemoglobin 11.8 (*)    Platelets 142 (*)    Neutro Abs 9.1 (*)    All other components within normal limits  INFLUENZA PANEL BY PCR (TYPE A & B)  MAGNESIUM    EKG EKG Interpretation  Date/Time:  Saturday November 08 2018 02:20:08 EST Ventricular Rate:  89 PR Interval:    QRS Duration: 81 QT Interval:  376 QTC Calculation: 458 R Axis:   32 Text Interpretation:  Sinus rhythm Low voltage, precordial leads Anteroseptal infarct, old Nonspecific T abnormalities, lateral leads Confirmed by Ripley Fraise [phone]) on 11/08/2018 2:23:49 AM   Radiology Dg Chest 2 View  Result Date: 11/08/2018 CLINICAL DATA:  Cough EXAM: CHEST - 2 VIEW COMPARISON:  09/30/2018, 07/03/2017, CT 07/22/2017 FINDINGS: Increased bilateral interstitial opacity with mild nodularity on the right. No consolidation or effusion. Normal cardiomediastinal silhouette. No pneumothorax. IMPRESSION: Diffusely increased interstitial opacity with mild nodularity on the right, suspect for acute interstitial inflammatory or infectious process superimposed on underlying chronic changes. Electronically Signed   By: Donavan Foil M.D.   On: 11/08/2018 00:18    Procedures Procedures  Medications Ordered in ED Medications  levofloxacin (LEVAQUIN) IVPB 750 mg (750 mg Intravenous New Bag/Given 11/08/18 0132)  potassium chloride 10 mEq in 100 mL IVPB (10 mEq Intravenous New Bag/Given 11/08/18 0228)  ketorolac (TORADOL) 30 MG/ML injection 30 mg (30 mg Intravenous Given 11/07/18 2352)  sodium chloride 0.9 % bolus 1,000 mL (0 mLs Intravenous Stopped 11/08/18 0124)  ipratropium-albuterol (DUONEB) 0.5-2.5 (3) MG/3ML nebulizer solution 3 mL (3 mLs Nebulization Given 11/08/18 0009)  albuterol  (PROVENTIL) (2.5 MG/3ML) 0.083% nebulizer solution 2.5 mg (2.5 mg Nebulization Given 11/08/18 0009)  potassium chloride SA (K-DUR,KLOR-CON) CR tablet 40 mEq (40 mEq Oral Given 11/08/18 0229)     Initial Impression / Assessment and Plan / ED Course  I have reviewed the triage vital signs and the nursing notes.  Pertinent labs & imaging results that were available during my care of the patient were reviewed by me and considered in my medical decision making (see chart for details).     2:41 AM Patient presents for recurrent episode of cough/fever.  X-ray again is consistent with infectious etiology. He was also found to be hypokalemic.  She would benefit from admission for IV antibiotics as well as potassium replacement.  Discussed with Dr. Darrick Meigs for admission  Final Clinical Impressions(s) / ED Diagnoses   Final diagnoses:  Community acquired pneumonia, unspecified laterality  Hypokalemia    ED Discharge Orders    None       Ripley Fraise, MD 11/08/18 [phone]

## 2018-11-08 NOTE — ED Notes (Signed)
Notified pharmacy pyxis out of IV potassium

## 2018-11-08 NOTE — H&P (Signed)
TRH H&P    Patient Demographics:    Melody [name], is a 63 y.o. female  MRN: [id number]  DOB - July 26, 1956  Admit Date - 11/07/2018  Referring MD/NP/PA: Dr. Christy Gentles  Outpatient Primary MD for the patient is Plotnikov, Evie Lacks, MD  Patient coming from: Home  Chief complaint-cough shortness of breath   HPI:    Melody [name]  is a 63 y.o. female, with history of bronchiectasis, depression, GERD, hypothyroidism, MAI came to hospital with complaint of shortness of breath, cough.  Patient was recently discharged from the hospital on 10/02/2018.  Patient says that she has experienced worsening shortness of breath, coughing up phlegm.  She denies coughing up blood. Denies chest pain, no abdominal pain. Denies dysuria. In the ED chest x-ray showed diffusely increased interstitial opacity with mild nodularity on the right suspect for acute interstitial inflammatory infectious process superimposed on underlying chronic changes Patient started on IV Levaquin.    Review of systems:    In addition to the HPI above,    All other systems reviewed and are negative.    Past History of the following :    Past Medical History:  Diagnosis Date  . Anxiety   . Bronchiectasis ([place])   . Chronic back pain   . Chronic neck pain   . Depression    bipolar- Dr [name]  . Fatigue   . Fibromyalgia   . GERD (gastroesophageal reflux disease)   . Headache   . Hypothyroidism   . Low back pain    Dr Arnoldo Morale  . 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([place])   . Osteoarthritis   . Pneumonia   . Pulmonary nodule   . Sinus congestion   . Thyroid disease    hypothyroidsm  . Vertigo   . Vitamin B12 deficiency       Past Surgical History:  Procedure Laterality Date  . ABDOMINAL HYSTERECTOMY N/A 01/10/2016   Procedure: HYSTERECTOMY ABDOMINAL;  Surgeon: Nunzio Cobbs, MD;  Location: [facility];  Service: Gynecology;   Laterality: N/A;  . ABDOMINAL SACROCOLPOPEXY N/A 01/10/2016   Procedure: ABDOMINO SACROCOLPOPEXY ;  Surgeon: Nunzio Cobbs, MD;  Location: [facility];  Service: Gynecology;  Laterality: N/A;  . ANTERIOR AND POSTERIOR REPAIR N/A 01/10/2016   Procedure:  POSTERIOR REPAIR (RECTOCELE);  Surgeon: Nunzio Cobbs, MD;  Location: [facility];  Service: Gynecology;  Laterality: N/A;  . BLADDER SUSPENSION N/A 01/10/2016   Procedure: TRANSVAGINAL TAPE (TVT) PROCEDURE exact midurethral sling;  Surgeon: Nunzio Cobbs, MD;  Location: [facility];  Service: Gynecology;  Laterality: N/A;  . CERVICAL LAMINECTOMY  2001 and 2995   Dr Arnoldo Morale  . CYSTO N/A 01/10/2016   Procedure: CYSTOSCOPY;  Surgeon: Nunzio Cobbs, MD;  Location: [facility];  Service: Gynecology;  Laterality: N/A;  . SALPINGOOPHORECTOMY Bilateral 01/10/2016   Procedure: BILATERAL SALPINGO OOPHORECTOMY;  Surgeon: Nunzio Cobbs, MD;  Location: [facility];  Service: Gynecology;  Laterality: Bilateral;  . [place]  Social History:      Social History   Tobacco Use  . Smoking status: Former Smoker    Packs/day: 1.00    Years: 35.00    Pack years: 35.00    Types: Cigarettes    Start date: 11/21/1962    Last attempt to quit: 05/06/2011    Years since quitting: 7.5  . Smokeless tobacco: Never Used  . Tobacco comment: stopped periodically - peak rate of 1 ppd  Substance Use Topics  . Alcohol use: No    Alcohol/week: 0.0 standard drinks       Family History :     Family History  Problem Relation Age of Onset  . Heart attack Father        dec heart disease age 72  . Hypertension Father   . Lung disease Father   . Cancer Mother        dec stomach ca--age 11  . Breast cancer Cousin   . Arthritis Other   . Breast cancer Paternal Aunt       Home Medications:   Prior to Admission medications   Medication Sig Start Date End Date Taking? Authorizing Provider  albuterol (PROVENTIL  HFA;VENTOLIN HFA) 108 (90 Base) MCG/ACT inhaler Inhale 2 puffs into the lungs every 6 (six) hours as needed for wheezing or shortness of breath. 09/24/18   Juanito Doom, MD  ALPRAZolam Duanne Moron) 1 MG tablet Take 1 mg by mouth 4 (four) times daily.     [provider]  Azelastine-Fluticasone (DYMISTA) 137-50 MCG/ACT SUSP Place 1 spray 2 (two) times daily into the nose. 09/12/17   Javier Glazier, MD  benzonatate (TESSALON) 200 MG capsule Take 200 mg by mouth every 8 (eight) hours as needed for cough.  09/09/18   [provider]  BREO ELLIPTA 100-25 MCG/INH AEPB inhale ONE PUFF into THE lungs DAILY 01/20/18   Plotnikov, Evie Lacks, MD  cyanocobalamin (,VITAMIN B-12,) 1000 MCG/ML injection INJECT 1 ML INTO THE MUSCLE EVERY 14 DAYS. Patient taking differently: 1,000 mcg every 14 (fourteen) days.  07/21/18   Plotnikov, Evie Lacks, MD  fluticasone (FLONASE) 50 MCG/ACT nasal spray USE 2 SPRAYS IN BOTH NOSTRILS DAILY. Patient taking differently: Place 2 sprays into both nostrils daily.  02/28/18   Plotnikov, Evie Lacks, MD  ketorolac (TORADOL) 10 MG tablet Take 10 mg by mouth every 6 (six) hours as needed. for pain 09/09/18   [provider]  levocetirizine (XYZAL) 5 MG tablet TAKE 1 TABLET BY MOUTH EVERY EVENING. Patient taking differently: Take 5 mg by mouth every evening.  06/26/18   Plotnikov, Evie Lacks, MD  levofloxacin (LEVAQUIN) 750 MG tablet Take 1 tablet (750 mg total) by mouth daily. 10/03/18   Orson Eva, MD  levothyroxine (SYNTHROID, LEVOTHROID) 50 MCG tablet TAKE ONE TABLET BY MOUTH DAILY BEFORE breakfast Patient taking differently: Take 50 mcg by mouth daily before breakfast.  02/11/18   Plotnikov, Evie Lacks, MD  methylphenidate (RITALIN) 20 MG tablet Take 20 mg by mouth 2 (two) times daily.  10/03/17   [provider]  oxyCODONE-acetaminophen (PERCOCET/ROXICET) 5-325 MG tablet Take 1 tablet by mouth 2 (two) times daily as needed for severe pain. Fill on or after  12/09/18 10/17/18   Plotnikov, Evie Lacks, MD  pantoprazole (PROTONIX) 40 MG tablet TAKE 1 TABLET BY MOUTH DAILY Patient taking differently: Take 40 mg by mouth daily.  06/26/18   Plotnikov, Evie Lacks, MD  QUEtiapine (SEROQUEL) 25 MG tablet Take 25 mg by mouth at  bedtime.     [provider]  Respiratory Therapy Supplies (FLUTTER) DEVI Use as directed 05/21/17   Javier Glazier, MD  triamcinolone ointment (KENALOG) 0.1 % Apply 1 application topically 2 (two) times daily. Patient taking differently: Apply 1 application topically 2 (two) times daily as needed.  05/11/18   Plotnikov, Evie Lacks, MD  Vitamin D, Ergocalciferol, (DRISDOL) 1.25 MG (50000 UT) CAPS capsule TAKE ONE CAPSULE BY MOUTH EVERY SEVEN DAYS Patient taking differently: Take 50,000 Units by mouth every 7 (seven) days.  09/16/18   Lyndal Pulley, DO     Allergies:     Allergies  Allergen Reactions  . Flagyl [Metronidazole] Shortness Of Breath    Made tongue turn dark red, was not able to breath.  . Doxycycline Hyclate Itching  . Gabapentin     Headaches: Patient is not aware of the reaction to this medication  . Nabumetone Other (See Comments)    Headache  . Sulfa Antibiotics Other (See Comments)    Reaction is unknown  . Tetracycline Hcl Itching  . Tramadol Hcl Other (See Comments)    Reaction is unknown     Physical Exam:   Vitals  Blood pressure (!) 99/56, pulse 89, temperature (!) 100.5 F (38.1 C), temperature source Oral, resp. rate 18, height 5\' 6"  (1.676 m), weight 62.6 kg, last menstrual period 10/29/2006, SpO2 96 %.  1.  General: Appears in no acute distress  2. Psychiatric:  Intact judgement and  insight, awake alert, oriented x 3.  3. Neurologic: No focal neurological deficits, all cranial nerves intact.Strength 5/5 all 4 extremities, sensation intact all 4 extremities, plantars down going.  4. Eyes :  anicteric sclerae, moist conjunctivae with no lid lag. PERRLA.  5. ENMT:    Oropharynx clear with moist mucous membranes and good dentition  6. Neck:  supple, no cervical lymphadenopathy appriciated, No thyromegaly  7. Respiratory : Normal respiratory effort, bilateral rhonchi auscultated  8. Cardiovascular : RRR, no gallops, rubs or murmurs, no leg edema  9. Gastrointestinal:  Positive bowel sounds, abdomen soft, non-tender to palpation,no hepatosplenomegaly, no rigidity or guarding       10. Skin:  No cyanosis, normal texture and turgor, no rash, lesions or ulcers  11.Musculoskeletal:  Good muscle tone,  joints appear normal , no effusions,  normal range of motion    Data Review:    CBC Recent Labs  Lab 11/08/18 0049  WBC 10.9*  HGB 11.8*  HCT 36.7  PLT 142*  MCV 88.9  MCH 28.6  MCHC 32.2  RDW 12.9  LYMPHSABS 1.1  MONOABS 0.6  EOSABS 0.0  BASOSABS 0.0   ------------------------------------------------------------------------------------------------------------------  Results for orders placed or performed during the hospital encounter of 11/07/18 (from the past 48 hour(s))  Basic metabolic panel     Status: Abnormal   Collection Time: 11/08/18 12:49 AM  Result Value Ref Range   Sodium 138 135 - 145 mmol/L   Potassium 2.7 (LL) 3.5 - 5.1 mmol/L    Comment: CRITICAL RESULT CALLED TO, READ BACK BY AND VERIFIED WITH: LONG,H @ 0174 ON 11/08/18 BY JUW    Chloride 106 98 - 111 mmol/L   CO2 22 22 - 32 mmol/L   Glucose, Bld 118 (H) 70 - 99 mg/dL   BUN 12 8 - 23 mg/dL   Creatinine, Ser 0.81 0.44 - 1.00 mg/dL   Calcium 8.7 (L) 8.9 - 10.3 mg/dL   GFR calc non Af Amer >60 >60 mL/min   GFR  calc Af Amer >60 >60 mL/min   Anion gap 10 5 - 15    Comment: Performed at [facility], [address] [place], [place] [address]  CBC with Differential/Platelet     Status: Abnormal   Collection Time: 11/08/18 12:49 AM  Result Value Ref Range   WBC 10.9 (H) 4.0 - 10.5 K/uL   RBC 4.13 3.87 - 5.11 MIL/uL   Hemoglobin 11.8 (L) 12.0 - 15.0 g/dL   HCT  36.7 36.0 - 46.0 %   MCV 88.9 80.0 - 100.0 fL   MCH 28.6 26.0 - 34.0 pg   MCHC 32.2 30.0 - 36.0 g/dL   RDW 12.9 11.5 - 15.5 %   Platelets 142 (L) 150 - 400 K/uL   nRBC 0.0 0.0 - 0.2 %   Neutrophils Relative % 84 %   Neutro Abs 9.1 (H) 1.7 - 7.7 K/uL   Lymphocytes Relative 10 %   Lymphs Abs 1.1 0.7 - 4.0 K/uL   Monocytes Relative 6 %   Monocytes Absolute 0.6 0.1 - 1.0 K/uL   Eosinophils Relative 0 %   Eosinophils Absolute 0.0 0.0 - 0.5 K/uL   Basophils Relative 0 %   Basophils Absolute 0.0 0.0 - 0.1 K/uL   Immature Granulocytes 0 %   Abs Immature Granulocytes 0.03 0.00 - 0.07 K/uL    Comment: Performed at [facility], [address] [place], [place] [address]  Influenza panel by PCR (type A & B)     Status: None   Collection Time: 11/08/18 12:49 AM  Result Value Ref Range   Influenza A By PCR NEGATIVE NEGATIVE   Influenza B By PCR NEGATIVE NEGATIVE    Comment: (NOTE) The Xpert Xpress Flu assay is intended as an aid in the diagnosis of  influenza and should not be used as a sole basis for treatment.  This  assay is FDA approved for nasopharyngeal swab specimens only. Nasal  washings and aspirates are unacceptable for Xpert Xpress Flu testing. Performed at [facility], [address] [place], [place] [address]   Magnesium     Status: None   Collection Time: 11/08/18 12:49 AM  Result Value Ref Range   Magnesium 2.1 1.7 - 2.4 mg/dL    Comment: Performed at [facility], [address] [place], [place] [address]    Chemistries  Recent Labs  Lab 11/08/18 0049  NA 138  K 2.7*  CL 106  CO2 22  GLUCOSE 118*  BUN 12  CREATININE 0.81  CALCIUM 8.7*  MG 2.1    ------------------------------------------------------------------------------------------------------------------  ------------------------------------------------------------------------------------------------------------------  --------------------------------------------------------------------------------------------------------------- Urine analysis:    Component Value Date/Time   COLORURINE YELLOW 09/30/2018 1424   APPEARANCEUR CLEAR 09/30/2018 1424   LABSPEC 1.016 09/30/2018 1424   PHURINE 6.0 09/30/2018 1424   GLUCOSEU NEGATIVE 09/30/2018 1424   GLUCOSEU NEGATIVE 05/16/2018 1454   HGBUR MODERATE (A) 09/30/2018 1424   BILIRUBINUR NEGATIVE 09/30/2018 1424   BILIRUBINUR n 04/27/2016 1513   KETONESUR NEGATIVE 09/30/2018 1424   PROTEINUR NEGATIVE 09/30/2018 1424   UROBILINOGEN 0.2 05/16/2018 1454   NITRITE NEGATIVE 09/30/2018 1424   LEUKOCYTESUR NEGATIVE 09/30/2018 1424      Imaging Results:    Dg Chest 2 View  Result Date: 11/08/2018 CLINICAL DATA:  Cough EXAM: CHEST - 2 VIEW COMPARISON:  09/30/2018, 07/03/2017, CT 07/22/2017 FINDINGS: Increased bilateral interstitial opacity with mild nodularity on the right. No consolidation or effusion. Normal cardiomediastinal silhouette. No pneumothorax. IMPRESSION: Diffusely increased interstitial opacity with mild nodularity on the right,  suspect for acute interstitial inflammatory or infectious process superimposed on underlying chronic changes. Electronically Signed   By: Donavan Foil M.D.   On: 11/08/2018 00:18    My personal review of EKG: Rhythm NSR   Assessment & Plan:    Active Problems:   CAP (community acquired pneumonia)   1. Community-acquired pneumonia-seen on chest x-ray, patient started on IV Levaquin.  Follow blood culture results.  Influenza panel is negative  2. Bronchiectasis/COPD-we will start DuoNebs every 6 hours, Solu-Medrol 60 mg IV every 6 hours, Mucinex 1200 mg p.o. twice  daily  3. Hypokalemia-potassium is 2.7, will replace potassium and check BMP in a.m.  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  5. Hypothyroidism-continue Synthroid    DVT Prophylaxis-   Lovenox   AM Labs Ordered, also please review Full Orders  Family Communication: Admission, patients condition and plan of care including tests being ordered have been discussed with the patient who indicate understanding and agree with the plan and Code Status.  Code Status: Full code  Admission status: Observation/Inpatient: Based on patients clinical presentation and evaluation of above clinical data, I have made determination that patient meets Inpatient criteria at this time.  Time spent in minutes : 60 minutes   Oswald Hillock M.D on 11/08/2018 at 4:13 AM  Between 7am to 7pm - Pager - [phone]. After 7pm go to [email or web address] - password [facility]   [facility] - Office  [phone]

## 2018-11-08 NOTE — Progress Notes (Signed)
Pt is eating. Will return later to administer DPI and neb

## 2018-11-08 NOTE — ED Notes (Signed)
CRITICAL VALUE ALERT  Critical Value: potassium 2.7  Date & Time Notied:  11/08/18 0210  Provider Notified: Christy Gentles EDP  Orders Received/Actions taken: No orders at this time

## 2018-11-08 NOTE — Progress Notes (Signed)
Patient admitted to 313. Vitals are stable at this time and she denies pain.  She is alert and oriented and able to move all extremities.   Will continue to monitor.

## 2018-11-09 DIAGNOSIS — A310 Pulmonary mycobacterial infection: Secondary | ICD-10-CM

## 2018-11-09 DIAGNOSIS — E034 Atrophy of thyroid (acquired): Secondary | ICD-10-CM

## 2018-11-09 DIAGNOSIS — K219 Gastro-esophageal reflux disease without esophagitis: Secondary | ICD-10-CM

## 2018-11-09 DIAGNOSIS — F319 Bipolar disorder, unspecified: Principal | ICD-10-CM

## 2018-11-09 DIAGNOSIS — J449 Chronic obstructive pulmonary disease, unspecified: Secondary | ICD-10-CM

## 2018-11-09 LAB — BASIC METABOLIC PANEL
Anion gap: 4 — ABNORMAL LOW (ref 5–15)
BUN: 11 mg/dL (ref 8–23)
CHLORIDE: 114 mmol/L — AB (ref 98–111)
CO2: 22 mmol/L (ref 22–32)
Calcium: 9 mg/dL (ref 8.9–10.3)
Creatinine, Ser: 0.63 mg/dL (ref 0.44–1.00)
GFR calc Af Amer: >60 mL/min (ref >60)
GFR calc non Af Amer: >60 mL/min (ref >60)
Glucose, Bld: 149 mg/dL — ABNORMAL HIGH (ref 70–99)
Potassium: 3.8 mmol/L (ref 3.5–5.1)
Sodium: 140 mmol/L (ref 135–145)

## 2018-11-09 LAB — MAGNESIUM: Magnesium: 2.1 mg/dL (ref 1.7–2.4)

## 2018-11-09 MED ORDER — KETOROLAC TROMETHAMINE 30 MG/ML IJ SOLN
30.0000 mg | Freq: Once | INTRAMUSCULAR | Status: AC
  Administered 2018-11-09: 30 mg via INTRAVENOUS
  Filled 2018-11-09: qty 1

## 2018-11-09 MED ORDER — HYDROCOD POLST-CPM POLST ER 10-8 MG/5ML PO SUER
5.0000 mL | Freq: Two times a day (BID) | ORAL | Status: DC | PRN
  Administered 2018-11-09 – 2018-11-10 (×2): 5 mL via ORAL
  Filled 2018-11-09 (×2): qty 5

## 2018-11-09 MED ORDER — IPRATROPIUM-ALBUTEROL 0.5-2.5 (3) MG/3ML IN SOLN
3.0000 mL | Freq: Four times a day (QID) | RESPIRATORY_TRACT | Status: DC
  Administered 2018-11-09 – 2018-11-10 (×5): 3 mL via RESPIRATORY_TRACT
  Filled 2018-11-09 (×5): qty 3

## 2018-11-09 MED ORDER — OXYCODONE-ACETAMINOPHEN 5-325 MG PO TABS: 1.0000 | ORAL_TABLET | Freq: Once | ORAL | Status: DC

## 2018-11-09 NOTE — Progress Notes (Signed)
Patient c/o headache, stated that Percocet does not help her headache. MD paged and orders given for Toradol x 1 dose. Patient stated that Toradol did not help her headache. MD paged again and orders given to give extra dose of Percocet. Medication taken to patient. Patient refused Percocet at this time. Medication returned to [facility]. Will continue to monitor patient.

## 2018-11-09 NOTE — Progress Notes (Addendum)
PROGRESS NOTE    Melody [name]  MCN:[id number]  DOB: 1956-03-24  DOA: 11/07/2018 PCP: Cassandria Anger, MD   Brief Admission Hx: 63 y.o. female, with history of bronchiectasis, depression, GERD, hypothyroidism, MAI came to hospital with complaint of shortness of breath, cough.  MDM/Assessment & Plan:    1. Healthcare associated pneumonia-given that she was hospitalized last month, seen on chest x-ray, patient started on IV Levaquin.  If no improvement would broaden coverage.  Continue levofloxacin for now as she is reporting some improvement.  Follow blood culture results.  Influenza panel is negative  2. Bronchiectasis/COPD-continue DuoNebs every 6 hours, Solu-Medrol 60 mg IV every 6 hours, Mucinex 1200 mg p.o. twice daily.  Add cough medication.   3. Hypokalemia-repleted.   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  5. Hypothyroidism-continue Synthroid 6. Bipolar Disorder - Resumed home medications.   DVT prophylaxis: lovenox  Code Status: Full  Family Communication: patient  Disposition Plan: Home when medically stabilized   Consultants:  N/a   Procedures:  n/a  Antimicrobials:  Levofloxacin 1/11 >   Subjective: Pt says she has been ambulating in room, still SOB but complains mostly of cough.   Objective: Vitals:   11/08/18 1655 11/08/18 2129 11/08/18 2322 11/09/18 0730  BP: 139/64  (!) 125/57   Pulse: 89  86   Resp: 20  18   Temp: 98.3 F (36.8 C)  98.2 F (36.8 C)   TempSrc: Oral  Oral   SpO2: 98% 97% 97% 98%  Weight: 62.6 kg     Height: 5\' 6"  (1.676 m)       Intake/Output Summary (Last 24 hours) at 11/09/2018 1242 Last data filed at 11/09/2018 0900 Gross per 24 hour  Intake 3053.32 ml  Output 1 ml  Net 3052.32 ml   Filed Weights   11/07/18 2311 11/08/18 1655  Weight: 62.6 kg 62.6 kg     REVIEW OF SYSTEMS  As per history otherwise all reviewed and reported negative  Exam:  General exam:  chronically ill appearing, lying in bed, no apparent distress.  Respiratory system: improved BS, less rales heard. No increased work of breathing. Cardiovascular system: S1 & S2 heard, RRR. No JVD, murmurs, gallops, clicks or pedal edema. Gastrointestinal system: Abdomen is nondistended, soft and nontender. Normal bowel sounds heard. Central nervous system: Alert and oriented. No focal neurological deficits. Extremities: no CCE.  Data Reviewed: Basic Metabolic Panel: Recent Labs  Lab 11/08/18 0049 11/08/18 0521 11/09/18 0640  NA 138 142 140  K 2.7* 3.7 3.8  CL 106 112* 114*  CO2 22 24 22   GLUCOSE 118* 95 149*  BUN 12 10 11   CREATININE 0.81 0.70 0.63  CALCIUM 8.7* 8.4* 9.0  MG 2.1  --  2.1   Liver Function Tests: Recent Labs  Lab 11/08/18 0521  AST 13*  ALT 9  ALKPHOS 49  BILITOT 0.8  PROT 6.0*  ALBUMIN 3.2*   No results for input(s): LIPASE, AMYLASE in the last 168 hours. No results for input(s): AMMONIA in the last 168 hours. CBC: Recent Labs  Lab 11/08/18 0049 11/08/18 0521  WBC 10.9* 9.3  NEUTROABS 9.1*  --   HGB 11.8* 11.2*  HCT 36.7 34.8*  MCV 88.9 90.4  PLT 142* 133*   Cardiac Enzymes: No results for input(s): CKTOTAL, CKMB, CKMBINDEX, TROPONINI in the last 168 hours. CBG (last 3)  No results for input(s): GLUCAP in the last 72 hours. Recent Results (from the past 240 hour(s))  Culture, blood (Routine X 2) w Reflex to ID Panel     Status: None (Preliminary result)   Collection Time: 11/08/18  5:21 AM  Result Value Ref Range Status   Specimen Description   Final    LEFT ANTECUBITAL BOTTLES DRAWN AEROBIC AND ANAEROBIC   Special Requests Blood Culture adequate volume  Final   Culture   Final    NO GROWTH 1 DAY Performed at [facility], [address], [place], [place] [address]    Report Status PENDING  Incomplete  Culture, blood (Routine X 2) w Reflex to ID Panel     Status: None (Preliminary result)   Collection Time: 11/08/18  5:28 AM  Result  Value Ref Range Status   Specimen Description   Final    BLOOD LEFT HAND BOTTLES DRAWN AEROBIC AND ANAEROBIC   Special Requests Blood Culture adequate volume  Final   Culture   Final    NO GROWTH 1 DAY Performed at [facility], [address], [place], [place] [address]    Report Status PENDING  Incomplete     Studies: Dg Chest 2 View  Result Date: 11/08/2018 CLINICAL DATA:  Cough EXAM: CHEST - 2 VIEW COMPARISON:  09/30/2018, 07/03/2017, CT 07/22/2017 FINDINGS: Increased bilateral interstitial opacity with mild nodularity on the right. No consolidation or effusion. Normal cardiomediastinal silhouette. No pneumothorax. IMPRESSION: Diffusely increased interstitial opacity with mild nodularity on the right, suspect for acute interstitial inflammatory or infectious process superimposed on underlying chronic changes. Electronically Signed   By: Donavan Foil M.D.   On: 11/08/2018 00:18   Scheduled Meds: . enoxaparin (LOVENOX) injection  40 mg Subcutaneous Q24H  . fluticasone furoate-vilanterol  1 puff Inhalation Daily  . guaiFENesin  1,200 mg Oral BID  . ipratropium-albuterol  3 mL Nebulization Q6H WA  . levothyroxine  50 mcg Oral QAC breakfast  . loratadine  10 mg Oral Daily  . methylphenidate  20 mg Oral Daily  . methylPREDNISolone (SOLU-MEDROL) injection  60 mg Intravenous Q6H  . pantoprazole  40 mg Oral Daily  . QUEtiapine  25 mg Oral QHS   Continuous Infusions: . sodium chloride 125 mL/hr at 11/09/18 0104  . levofloxacin (LEVAQUIN) IV Stopped (11/09/18 0231)    Principal Problem:   HCAP (healthcare-associated pneumonia) Active Problems:   Hypothyroidism   Bipolar I disorder (HCC)   GERD   Chronic obstructive airway disease with asthma (HCC)   Fatigue   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([place])   Hypokalemia  Time spent:   Irwin Brakeman, MD [facility]  If 7PM-7AM, please contact night-coverage [email or web address] Password [email or web address] 11/09/2018, 12:42 PM    LOS:  1 day

## 2018-11-09 NOTE — Progress Notes (Signed)
Patient requested to sleep at 2 am neb. Stated she will call.

## 2018-11-10 DIAGNOSIS — R5383 Other fatigue: Secondary | ICD-10-CM

## 2018-11-10 MED ORDER — GUAIFENESIN ER 600 MG PO TB12: 1200.0000 mg | ORAL_TABLET | Freq: Two times a day (BID) | ORAL | 0 refills | Status: AC

## 2018-11-10 MED ORDER — PANTOPRAZOLE SODIUM 40 MG PO TBEC
40.0000 mg | DELAYED_RELEASE_TABLET | Freq: Every day | ORAL | Status: DC
  Administered 2018-11-10: 40 mg via ORAL

## 2018-11-10 MED ORDER — METHYLPHENIDATE HCL 20 MG PO TABS: 20.0000 mg | ORAL_TABLET | Freq: Every day | ORAL | 0 refills | Status: AC

## 2018-11-10 MED ORDER — PREDNISONE 20 MG PO TABS: ORAL_TABLET | ORAL | 0 refills | Status: DC

## 2018-11-10 MED ORDER — LEVOFLOXACIN 750 MG PO TABS: 750.0000 mg | ORAL_TABLET | Freq: Every day | ORAL | 0 refills | Status: AC

## 2018-11-10 NOTE — Progress Notes (Signed)
Patient is refusing discharge at this time. MD has been E-Paged.

## 2018-11-10 NOTE — Care Management (Signed)
Pt refusing to DC. CM listened to pt's concerns, acknowledged her struggle with ongoing HCAP. Provided copy of IM and explained DC appeal process. CM awaiting pt's decision on appeal.

## 2018-11-10 NOTE — Discharge Instructions (Signed)
Seek medical care or return to emergency department if symptoms come back, worsen or new problems develop. Please make an appointment to establish care with pulmonologist Dr. Luan Pulling as soon as possible.

## 2018-11-10 NOTE — Clinical Social Work Note (Signed)
Clinical Social Work Assessment  Patient Details  Name: Melody [name] MRN: [id number] Date of Birth: 1956/08/09  Date of referral:  11/10/18               Reason for consult:  Other (Comment Required), Abuse/Neglect                Permission sought to share information with:    Permission granted to share information::     Name::        Agency::     Relationship::     Contact Information:     Housing/Transportation Living arrangements for the past 2 months:  Single Family Home Source of Information:  Patient Patient Interpreter Needed:  None Criminal Activity/Legal Involvement Pertinent to Current Situation/Hospitalization:  No - Comment as needed Significant Relationships:  Adult Children Lives with:  Adult Children Do you feel safe going back to the place where you live?  Yes Need for family participation in patient care:  Yes (Comment)  Care giving concerns:  No care giving concerns.    Social Worker assessment / plan:  Patient stated her "son has gotten a little better." She indicated that he is still not helping with bills.  She indicated that her current issues are: her home has mold that she cannot get rid of, her foundation is getting washed away, her home has no crawl space and she uses wood heating.  LCSW provided some resources related to patient possible agencies that may have resources for home repair (ADTS). Patient then stated "I don't want to be in my home. I want to be in a clean environment." LCSW also made a referral to [facility] to assist patient with her chronic issues.   LCSW signing off.   Employment status:  Disabled (Comment on whether or not currently receiving Disability) Insurance information:  Programmer, applications, Medicaid In [place] PT Recommendations:  Not assessed at this time Information / Referral to community resources:  Other (Comment Required)([facility] order placed. ADTS info added to discharge paperwork)  Patient/Family's Response to care: Patient is upset  about her living situation.   Patient/Family's Understanding of and Emotional Response to Diagnosis, Current Treatment, and Prognosis:  Patient understands her diagnosis, treatment and prognosis.   Emotional Assessment Appearance:  Appears stated age Attitude/Demeanor/Rapport:  Complaining Affect (typically observed):  Agitated Orientation:  Oriented to Self, Oriented to Place, Oriented to  Time, Oriented to Situation Alcohol / Substance use:  Not Applicable Psych involvement (Current and /or in the community):  No (Comment)  Discharge Needs  Concerns to be addressed:  Discharge Planning Concerns Readmission within the last 30 days:  No Current discharge risk:  Inadequate Financial Supports Barriers to Discharge:  No Barriers Identified   Ihor Gully, LCSW 11/10/2018, 11:52 AM

## 2018-11-10 NOTE — Telephone Encounter (Signed)
Pt went to ED

## 2018-11-10 NOTE — Progress Notes (Signed)
Patient's IV catheter removed and intact. [name] IV site clean dry and intact. Discharge instructions including medications and follow up appointments were reviewed and discussed with patient. All questions were answered and no further questions at this time. Pt in stable condition and in no acute distress at time of discharge. Pt escorted by RN.

## 2018-11-10 NOTE — Discharge Summary (Signed)
Physician Discharge Summary  Melody [name] OMB:[id number] DOB: November 19, 1955 DOA: 11/07/2018  PCP: Cassandria Anger, MD  Admit date: 11/07/2018 Discharge date: 11/10/2018  Admitted From: Home  Disposition: Home   Recommendations for Outpatient Follow-up:  1. Follow up with PCP in 1 weeks 2. Follow up with pulmonology Dr. Luan Pulling in 2 weeks  Discharge Condition: STABLE   CODE STATUS: FULL    Brief Hospitalization Summary: Please see all hospital notes, images, labs for full details of the hospitalization. Melody [name]  is a 63 y.o. female, with history of bronchiectasis, depression, GERD, hypothyroidism, MAI came to hospital with complaint of shortness of breath, cough.  Patient was recently discharged from the hospital on 10/02/2018.  Patient says that she has experienced worsening shortness of breath, coughing up phlegm.  She denies coughing up blood. Denies chest pain, no abdominal pain. Denies dysuria. In the ED chest x-ray showed diffusely increased interstitial opacity with mild nodularity on the right suspect for acute interstitial inflammatory infectious process superimposed on underlying chronic changes Patient started on IV Levaquin.  Brief Admission Hx: 63 y.o.female,with history of bronchiectasis, depression, GERD, hypothyroidism, MAI came to hospital with complaint of shortness of breath, cough.  MDM/Assessment & Plan:   1. Healthcare associated pneumonia-given that she was hospitalized last month, seen on chest x-ray, patient started on IV Levaquin. She is clinically improving and feeling better, ambulating in the room. Continue levofloxacin.  She is on room air.    Blood culture no growth to date. Influenza panel is negative  2. Bronchiectasis/COPD-treated with DuoNebs every 6 hours, Solu-Medrol 60 mg IV every 6 hours, Mucinex 1200 mg p.o. twice daily.  Add cough medication.  Discharged home on a prednisone taper.  3. Hypokalemia-repleted.   4. 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  Because of difficulty with transportation, the patient has requested to follow-up and establish care with Dr. Luan Pulling so that she can be seen by a local pulmonologist.  She understands that he is retiring at the end of the year but another pulmonologist is going to be taking over that practice.  5. Hypothyroidism-continue Synthroid 6. Bipolar Disorder - Resumed home medications.   DVT prophylaxis: lovenox  Code Status: Full  Family Communication: patient  Disposition Plan: Home    Consultants:  N/a   Procedures:  n/a  Antimicrobials:  Levofloxacin 1/11 >   Discharge Diagnoses:  Principal Problem:   HCAP (healthcare-associated pneumonia) Active Problems:   Hypothyroidism   Bipolar I disorder ([place])   GERD   Chronic obstructive airway disease with asthma ([place])   Fatigue   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(HCC)   Hypokalemia  Discharge Instructions: Discharge Instructions    Call MD for:  difficulty breathing, headache or visual disturbances   Complete by:  As directed    Call MD for:  extreme fatigue   Complete by:  As directed    Call MD for:  persistant dizziness or Krikorian-headedness   Complete by:  As directed    Call MD for:  persistant nausea and vomiting   Complete by:  As directed    Call MD for:  severe uncontrolled pain   Complete by:  As directed    Increase activity slowly   Complete by:  As directed      Allergies as of 11/10/2018      Reactions   Flagyl [metronidazole] Shortness Of Breath   Made tongue turn dark red, was not able to breath.   Doxycycline Hyclate Itching  Gabapentin    Headaches: Patient is not aware of the reaction to this medication   Nabumetone Other (See Comments)   Headache   Sulfa Antibiotics Other (See Comments)   Reaction is unknown   Tetracycline Hcl Itching   Tramadol Hcl Other (See Comments)   Reaction is unknown      Medication List     STOP taking these medications   fluticasone 50 MCG/ACT nasal spray Commonly known as:  FLONASE     TAKE these medications   albuterol 108 (90 Base) MCG/ACT inhaler Commonly known as:  PROVENTIL HFA;VENTOLIN HFA Inhale 2 puffs into the lungs every 6 (six) hours as needed for wheezing or shortness of breath.   ALPRAZolam 1 MG tablet Commonly known as:  XANAX Take 1 mg by mouth 4 (four) times daily.   Azelastine-Fluticasone 137-50 MCG/ACT Susp Commonly known as:  DYMISTA Place 1 spray 2 (two) times daily into the nose.   BREO ELLIPTA 100-25 MCG/INH Aepb Generic drug:  fluticasone furoate-vilanterol inhale ONE PUFF into THE lungs DAILY   cyanocobalamin 1000 MCG/ML injection Commonly known as:  (VITAMIN B-12) INJECT 1 ML INTO THE MUSCLE EVERY 14 DAYS. What changed:  See the new instructions.   FLUTTER Devi Use as directed   guaiFENesin 600 MG 12 hr tablet Commonly known as:  MUCINEX Take 2 tablets (1,200 mg total) by mouth 2 (two) times daily for 7 days.   ketorolac 10 MG tablet Commonly known as:  TORADOL Take 10 mg by mouth every 6 (six) hours as needed. for pain   levocetirizine 5 MG tablet Commonly known as:  XYZAL TAKE 1 TABLET BY MOUTH EVERY EVENING.   levofloxacin 750 MG tablet Commonly known as:  LEVAQUIN Take 1 tablet (750 mg total) by mouth daily for 3 days.   levothyroxine 50 MCG tablet Commonly known as:  SYNTHROID, LEVOTHROID TAKE ONE TABLET BY MOUTH DAILY BEFORE breakfast What changed:    how much to take  how to take this  when to take this  additional instructions   methylphenidate 20 MG tablet Commonly known as:  RITALIN Take 1 tablet (20 mg total) by mouth daily. What changed:  when to take this   oxyCODONE-acetaminophen 5-325 MG tablet Commonly known as:  PERCOCET/ROXICET Take 1 tablet by mouth 2 (two) times daily as needed for severe pain. Fill on or after 12/09/18   pantoprazole 40 MG tablet Commonly known as:  PROTONIX TAKE 1  TABLET BY MOUTH DAILY   predniSONE 20 MG tablet Commonly known as:  DELTASONE Take 3 PO QAM x3days, 2 PO QAM x3days, 1 PO QAM x3days Start taking on:  November 11, 2018   QUEtiapine 25 MG tablet Commonly known as:  SEROQUEL Take 25 mg by mouth at bedtime.   triamcinolone ointment 0.1 % Commonly known as:  KENALOG Apply 1 application topically 2 (two) times daily. What changed:    when to take this  reasons to take this   Vitamin D (Ergocalciferol) 1.25 MG (50000 UT) Caps capsule Commonly known as:  DRISDOL TAKE ONE CAPSULE BY MOUTH EVERY SEVEN DAYS What changed:  See the new instructions.      Follow-up Information    [facility]. Call.   Why:  Contact this agency to determine if they have resources related to housing repair Contact information: [address] [place], [place] [address] [phone]       [facility] Follow up.   Why:  A referral was made to [facility]. They will contact you. Their number is [phone] (please keep for your records).         Sinda Du, MD. Schedule an appointment as soon as possible for a visit in 2 week(s).   Specialty:  Pulmonary Disease Why:  Establish Pulmonary Care for bronchiectasis Contact information: [address] [place] [address] [phone]          Allergies  Allergen Reactions  . Flagyl [Metronidazole] Shortness Of Breath    Made tongue turn dark red, was not able to breath.  . Doxycycline Hyclate Itching  . Gabapentin     Headaches: Patient is not aware of the reaction to this medication  . Nabumetone Other (See Comments)    Headache  . Sulfa Antibiotics Other (See Comments)    Reaction is unknown  . Tetracycline Hcl Itching  . Tramadol Hcl Other (See Comments)    Reaction is unknown   Allergies as of 11/10/2018      Reactions   Flagyl [metronidazole] Shortness Of Breath   Made tongue turn dark red, was not able to breath.    Doxycycline Hyclate Itching   Gabapentin    Headaches: Patient is not aware of the reaction to this medication   Nabumetone Other (See Comments)   Headache   Sulfa Antibiotics Other (See Comments)   Reaction is unknown   Tetracycline Hcl Itching   Tramadol Hcl Other (See Comments)   Reaction is unknown      Medication List    STOP taking these medications   fluticasone 50 MCG/ACT nasal spray Commonly known as:  FLONASE     TAKE these medications   albuterol 108 (90 Base) MCG/ACT inhaler Commonly known as:  PROVENTIL HFA;VENTOLIN HFA Inhale 2 puffs into the lungs every 6 (six) hours as needed for wheezing or shortness of breath.   ALPRAZolam 1 MG tablet Commonly known as:  XANAX Take 1 mg by mouth 4 (four) times daily.   Azelastine-Fluticasone 137-50 MCG/ACT Susp Commonly known as:  DYMISTA Place 1 spray 2 (two) times daily into the nose.   BREO ELLIPTA 100-25 MCG/INH Aepb Generic drug:  fluticasone furoate-vilanterol inhale ONE PUFF into THE lungs DAILY   cyanocobalamin 1000 MCG/ML injection Commonly known as:  (VITAMIN B-12) INJECT 1 ML INTO THE MUSCLE EVERY 14 DAYS. What changed:  See the new instructions.   FLUTTER Devi Use as directed   guaiFENesin 600 MG 12 hr tablet Commonly known as:  MUCINEX Take 2 tablets (1,200 mg total) by mouth 2 (two) times daily for 7 days.   ketorolac 10 MG tablet Commonly known as:  TORADOL Take 10 mg by mouth every 6 (six) hours as needed. for pain   levocetirizine 5 MG tablet Commonly known as:  XYZAL TAKE 1 TABLET BY MOUTH EVERY EVENING.   levofloxacin 750 MG tablet Commonly known as:  LEVAQUIN Take 1 tablet (750 mg total) by mouth daily for 3 days.   levothyroxine 50 MCG tablet Commonly known as:  SYNTHROID, LEVOTHROID TAKE ONE TABLET BY MOUTH DAILY BEFORE breakfast What changed:    how much to take  how to take this  when to take this  additional instructions   methylphenidate 20 MG tablet Commonly  known as:  RITALIN Take 1 tablet (20 mg total) by mouth daily. What changed:  when to take this   oxyCODONE-acetaminophen 5-325 MG tablet Commonly known as:  PERCOCET/ROXICET Take 1 tablet by mouth 2 (two) times daily as needed for severe pain.  Fill on or after 12/09/18   pantoprazole 40 MG tablet Commonly known as:  PROTONIX TAKE 1 TABLET BY MOUTH DAILY   predniSONE 20 MG tablet Commonly known as:  DELTASONE Take 3 PO QAM x3days, 2 PO QAM x3days, 1 PO QAM x3days Start taking on:  November 11, 2018   QUEtiapine 25 MG tablet Commonly known as:  SEROQUEL Take 25 mg by mouth at bedtime.   triamcinolone ointment 0.1 % Commonly known as:  KENALOG Apply 1 application topically 2 (two) times daily. What changed:    when to take this  reasons to take this   Vitamin D (Ergocalciferol) 1.25 MG (50000 UT) Caps capsule Commonly known as:  DRISDOL TAKE ONE CAPSULE BY MOUTH EVERY SEVEN DAYS What changed:  See the new instructions.       Procedures/Studies: Dg Chest 2 View  Result Date: 11/08/2018 CLINICAL DATA:  Cough EXAM: CHEST - 2 VIEW COMPARISON:  09/30/2018, 07/03/2017, CT 07/22/2017 FINDINGS: Increased bilateral interstitial opacity with mild nodularity on the right. No consolidation or effusion. Normal cardiomediastinal silhouette. No pneumothorax. IMPRESSION: Diffusely increased interstitial opacity with mild nodularity on the right, suspect for acute interstitial inflammatory or infectious process superimposed on underlying chronic changes. Electronically Signed   By: Donavan Foil M.D.   On: 11/08/2018 00:18     Subjective: Pt denies SOB and chest pain.   Pt ambulating in room.    Discharge Exam: Vitals:   11/10/18 0747 11/10/18 0755  BP:    Pulse:    Resp:    Temp:    SpO2: 98% 100%   Vitals:   11/09/18 2111 11/10/18 0600 11/10/18 0747 11/10/18 0755  BP: 139/76 122/67    Pulse: 91 67    Resp:      Temp: 98.5 F (36.9 C) 98.3 F (36.8 C)    TempSrc: Oral Oral     SpO2: 96% 96% 98% 100%  Weight:      Height:       General exam: chronically ill appearing, lying in bed, no apparent distress.  Respiratory system: BS clear bilateral. No increased work of breathing. Cardiovascular system: S1 & S2 heard, RRR. No JVD, murmurs, gallops, clicks or pedal edema. Gastrointestinal system: Abdomen is nondistended, soft and nontender. Normal bowel sounds heard. Central nervous system: Alert and oriented. No focal neurological deficits. Extremities: no CCE.   The results of significant diagnostics from this hospitalization (including imaging, microbiology, ancillary and laboratory) are listed below for reference.     Microbiology: Recent Results (from the past 240 hour(s))  Culture, blood (Routine X 2) w Reflex to ID Panel     Status: None (Preliminary result)   Collection Time: 11/08/18  5:21 AM  Result Value Ref Range Status   Specimen Description   Final    LEFT ANTECUBITAL BOTTLES DRAWN AEROBIC AND ANAEROBIC   Special Requests Blood Culture adequate volume  Final   Culture   Final    NO GROWTH 2 DAYS Performed at [facility], [address], [place], [place] [address]    Report Status PENDING  Incomplete  Culture, blood (Routine X 2) w Reflex to ID Panel     Status: None (Preliminary result)   Collection Time: 11/08/18  5:28 AM  Result Value Ref Range Status   Specimen Description   Final    BLOOD LEFT HAND BOTTLES DRAWN AEROBIC AND ANAEROBIC   Special Requests Blood Culture adequate volume  Final   Culture   Final    NO  GROWTH 2 DAYS Performed at [facility], [address], [place], [place] [address]    Report Status PENDING  Incomplete     Labs: BNP (last 3 results) No results for input(s): BNP in the last 8760 hours. Basic Metabolic Panel: Recent Labs  Lab 11/08/18 0049 11/08/18 0521 11/09/18 0640  NA 138 142 140  K 2.7* 3.7 3.8  CL 106 112* 114*  CO2 22 24 22   GLUCOSE 118* 95 149*  BUN 12 10 11   CREATININE 0.81 0.70 0.63   CALCIUM 8.7* 8.4* 9.0  MG 2.1  --  2.1   Liver Function Tests: Recent Labs  Lab 11/08/18 0521  AST 13*  ALT 9  ALKPHOS 49  BILITOT 0.8  PROT 6.0*  ALBUMIN 3.2*   No results for input(s): LIPASE, AMYLASE in the last 168 hours. No results for input(s): AMMONIA in the last 168 hours. CBC: Recent Labs  Lab 11/08/18 0049 11/08/18 0521  WBC 10.9* 9.3  NEUTROABS 9.1*  --   HGB 11.8* 11.2*  HCT 36.7 34.8*  MCV 88.9 90.4  PLT 142* 133*   Cardiac Enzymes: No results for input(s): CKTOTAL, CKMB, CKMBINDEX, TROPONINI in the last 168 hours. BNP: Invalid input(s): POCBNP CBG: No results for input(s): GLUCAP in the last 168 hours. D-Dimer No results for input(s): DDIMER in the last 72 hours. Hgb A1c No results for input(s): HGBA1C in the last 72 hours. Lipid Profile No results for input(s): CHOL, HDL, LDLCALC, TRIG, CHOLHDL, LDLDIRECT in the last 72 hours. Thyroid function studies No results for input(s): TSH, T4TOTAL, T3FREE, THYROIDAB in the last 72 hours.  Invalid input(s): FREET3 Anemia work up No results for input(s): VITAMINB12, FOLATE, FERRITIN, TIBC, IRON, RETICCTPCT in the last 72 hours. Urinalysis    Component Value Date/Time   COLORURINE YELLOW 09/30/2018 1424   APPEARANCEUR CLEAR 09/30/2018 1424   LABSPEC 1.016 09/30/2018 1424   PHURINE 6.0 09/30/2018 1424   GLUCOSEU NEGATIVE 09/30/2018 1424   GLUCOSEU NEGATIVE 05/16/2018 1454   HGBUR MODERATE (A) 09/30/2018 1424   BILIRUBINUR NEGATIVE 09/30/2018 1424   BILIRUBINUR n 04/27/2016 1513   KETONESUR NEGATIVE 09/30/2018 1424   PROTEINUR NEGATIVE 09/30/2018 1424   UROBILINOGEN 0.2 05/16/2018 1454   NITRITE NEGATIVE 09/30/2018 1424   LEUKOCYTESUR NEGATIVE 09/30/2018 1424   Sepsis Labs Invalid input(s): PROCALCITONIN,  WBC,  LACTICIDVEN Microbiology Recent Results (from the past 240 hour(s))  Culture, blood (Routine X 2) w Reflex to ID Panel     Status: None (Preliminary result)   Collection Time: 11/08/18   5:21 AM  Result Value Ref Range Status   Specimen Description   Final    LEFT ANTECUBITAL BOTTLES DRAWN AEROBIC AND ANAEROBIC   Special Requests Blood Culture adequate volume  Final   Culture   Final    NO GROWTH 2 DAYS Performed at [facility], [address], [place], [place] [address]    Report Status PENDING  Incomplete  Culture, blood (Routine X 2) w Reflex to ID Panel     Status: None (Preliminary result)   Collection Time: 11/08/18  5:28 AM  Result Value Ref Range Status   Specimen Description   Final    BLOOD LEFT HAND BOTTLES DRAWN AEROBIC AND ANAEROBIC   Special Requests Blood Culture adequate volume  Final   Culture   Final    NO GROWTH 2 DAYS Performed at [facility], [address], [place], [place] [address]    Report Status PENDING  Incomplete    Time  coordinating discharge: 33 mins  SIGNED:  Irwin Brakeman, MD  [facility] 11/10/2018, 12:51 PM

## 2018-11-11 ENCOUNTER — Telehealth: Payer: Self-pay | Admitting: *Deleted

## 2018-11-11 ENCOUNTER — Ambulatory Visit: Payer: Self-pay | Admitting: Internal Medicine

## 2018-11-11 NOTE — Telephone Encounter (Signed)
Transition Care Management Follow-up Telephone Call   Date discharged? 11/10/18   How have you been since you were released from the hospital? Pt states she is doing alright. Still have the cough   Do you understand why you were in the hospital? YES   Do you understand the discharge instructions? YES   Where were you discharged to? Home   Items Reviewed:  Medications reviewed: YES  Allergies reviewed: YES  Dietary changes reviewed: YES  Referrals reviewed: YES, pt states she is not sure if she will need a referral to see Dr. Luan Pulling. Inform pt when she come in for her appt if Dr. Alain Marion think she need to see him he can do the referral.    Functional Questionnaire:   Activities of Daily Living (ADLs):   She states she are independent in the following: ambulation, bathing and hygiene, feeding, continence, grooming, toileting and dressing States she doesn't require assistance    Any transportation issues/concerns?: NO   Any patient concerns? YES, pt goes on to say that she has been going back and fourth to the hospital since Thanksgiving. She states her house has mold, and this is what causing her to be sick w/this infection. She states now she has pneumonia, and wanted to ask what she can do to get a income base apartment. Inform pt that will be something that she may have to ask MD about or look up some type assistant w/th her county   Confirmed importance and date/time of follow-up visits scheduled YES, 11/17/18  Provider Appointment booked with Dr. Alain Marion  Confirmed with patient if condition begins to worsen call PCP or go to the ER.  Patient was given the office number and encouraged to call back with question or concerns.  : YES

## 2018-11-12 ENCOUNTER — Encounter: Payer: Self-pay | Admitting: *Deleted

## 2018-11-12 ENCOUNTER — Other Ambulatory Visit: Payer: Self-pay | Admitting: *Deleted

## 2018-11-12 DIAGNOSIS — J449 Chronic obstructive pulmonary disease, unspecified: Secondary | ICD-10-CM

## 2018-11-12 NOTE — Patient Outreach (Addendum)
Referral received from inpatient LCSW at [facility] (pt hospitalized 1/10-1/13/20 for community acquired pneumonia) MD provides transition of care, reason for referral states for social issues, anxiety and issues with patient's home, mold, foundation washing away, heats with wood and home needs repairs, no answer to telephone [phone] and received " voicemail not set up".  RN CM mailed unsuccessful outreach letter to pt home. Pt called back and requested return phone call and requested CSW services. RNCM called pt, HIPAA verified,  Screening completed, pt reports her house " is a disaster and should be condemned" but with this process pt states she would lose her SSI benefit. Pt reports " I have disability and SSI and can't afford to lose anything"  Pt states she has 2 adult children that do not help her, pt lives alone and reports she has no one to assist, pt states there is long list of things that are wrong with house, wood heat, visible mold, musty smell, wiring needs to be redone, no insulation, pt states " doctor says my pneumonia and respiratory issues will never get better as long as I live here"  Pt clearly has anxiety and is emotional and tearful during conversation citing she feels she is never going to be able to leave this home. RN CM faxed barrier letter and today's visit note to primary MD Dr. Alain Marion.  [facility] CM Care Plan Problem One     Most Recent Value  Care Plan Problem One  Difficulty coping with and having resolution of recent diagnosis pneumonia due to environmental factors  Role Documenting the Problem One  Care Management [place] for Problem One  Active  [facility] Long Term Goal   Pt will demonstrate improved understanding of self care for pneumonia and working with social work for resources/ solutions related to environmental factors in the home within 60 days  [place] Term Goal Start Date  11/12/18  Interventions for Problem One Long Term Goal  RN CM gave pt  contact number and 24 hour nurse line number, placed referral for [facility] CSW for housing issues (mold, foundation issues, wiring, no insulation, wood heat), resources and guidance for depression , anxiety  [facility] CM Short Term Goal #1   Pt will show improvement of respiratory condition (pneumonia, chronic airway disease with asthma) within 30 days  [facility] CM Short Term Goal #1 Start Date  11/12/18  Interventions for Short Term Goal #1  RN CM completed screening, reviewed medications over the phone, importance of using inhaler as needed, taking all antibiotic and following with primary MD on 11/17/18, pt is to make appointment with pulmonologist Dr. Luan Pulling and is able to do so, reviewed environmental triggers that must be resolved for pt to feel better, ask pt to avoid sick persons and wash hands    [facility] CM Care Plan Problem Two     Most Recent Value  Care Plan Problem Two  Difficulty coping due to anxiety and depression  Role Documenting the Problem Two  Care Management Coordinator  Care Plan for Problem Two  Active  Interventions for Problem Two Long Term Goal   RN CM spoke with [facility] CSW Raynaldo Opitz and reported pt has anxiety and depression and does see psychiatrist, pt has name of 3 counselors/ psychologists and states she will make appointment, reported pt feels anxiety is worsened due to housing situation,  RN CM talked wtih pt about necessity to leave the home as per pt the home needs to be  condemned but pt states this is most likely not an option  [facility] Long Term Goal  Pt will demonstrate improved coping mechanisms related to anxiety and depression within 60 days  [facility] Long Term Goal Start Date  11/12/18      Outpatient Encounter Medications as of 11/12/2018  Medication Sig  . albuterol (PROVENTIL HFA;VENTOLIN HFA) 108 (90 Base) MCG/ACT inhaler Inhale 2 puffs into the lungs every 6 (six) hours as needed for wheezing or shortness of breath.  . ALPRAZolam (XANAX) 1 MG tablet Take 1 mg by mouth 4 (four)  times daily.   . Azelastine-Fluticasone (DYMISTA) 137-50 MCG/ACT SUSP Place 1 spray 2 (two) times daily into the nose.  Marland Kitchen BREO ELLIPTA 100-25 MCG/INH AEPB inhale ONE PUFF into THE lungs DAILY  . cyanocobalamin (,VITAMIN B-12,) 1000 MCG/ML injection INJECT 1 ML INTO THE MUSCLE EVERY 14 DAYS. (Patient taking differently: 1,000 mcg every 14 (fourteen) days. )  . guaiFENesin (MUCINEX) 600 MG 12 hr tablet Take 2 tablets (1,200 mg total) by mouth 2 (two) times daily for 7 days.  Marland Kitchen levocetirizine (XYZAL) 5 MG tablet TAKE 1 TABLET BY MOUTH EVERY EVENING. (Patient taking differently: Take 5 mg by mouth every evening. )  . levofloxacin (LEVAQUIN) 750 MG tablet Take 1 tablet (750 mg total) by mouth daily for 3 days.  Marland Kitchen levothyroxine (SYNTHROID, LEVOTHROID) 50 MCG tablet TAKE ONE TABLET BY MOUTH DAILY BEFORE breakfast (Patient taking differently: Take 50 mcg by mouth daily before breakfast. )  . methylphenidate (RITALIN) 20 MG tablet Take 1 tablet (20 mg total) by mouth daily.  Marland Kitchen oxyCODONE-acetaminophen (PERCOCET/ROXICET) 5-325 MG tablet Take 1 tablet by mouth 2 (two) times daily as needed for severe pain. Fill on or after 12/09/18  . pantoprazole (PROTONIX) 40 MG tablet TAKE 1 TABLET BY MOUTH DAILY (Patient taking differently: Take 40 mg by mouth daily. )  . predniSONE (DELTASONE) 20 MG tablet Take 3 PO QAM x3days, 2 PO QAM x3days, 1 PO QAM x3days  . QUEtiapine (SEROQUEL) 25 MG tablet Take 25 mg by mouth at bedtime.   Marland Kitchen Respiratory Therapy Supplies (FLUTTER) DEVI Use as directed  . triamcinolone ointment (KENALOG) 0.1 % Apply 1 application topically 2 (two) times daily. (Patient taking differently: Apply 1 application topically 2 (two) times daily as needed. )  . Vitamin D, Ergocalciferol, (DRISDOL) 1.25 MG (50000 UT) CAPS capsule TAKE ONE CAPSULE BY MOUTH EVERY SEVEN DAYS (Patient taking differently: Take 50,000 Units by mouth every 7 (seven) days. )  . ketorolac (TORADOL) 10 MG tablet Take 10 mg by mouth every  6 (six) hours as needed. for pain   No facility-administered encounter medications on file as of 11/12/2018.    PLAN Collaborate with [facility] CSW as needed Call pt within 2 weeks (after she sees primary care MD on 11/17/18)  [name] [facility], [place] Coordinator [phone]

## 2018-11-12 NOTE — Addendum Note (Signed)
Addended by: [name] A on: 11/12/2018 03:02 PM   Modules accepted: Orders

## 2018-11-13 ENCOUNTER — Encounter: Payer: Self-pay | Admitting: Internal Medicine

## 2018-11-13 LAB — CULTURE, BLOOD (ROUTINE X 2)
Culture: NO GROWTH
Culture: NO GROWTH
SPECIAL REQUESTS: ADEQUATE
Special Requests: ADEQUATE

## 2018-11-13 NOTE — Progress Notes (Signed)
Abstracted and sent to scan  

## 2018-11-17 ENCOUNTER — Ambulatory Visit (INDEPENDENT_AMBULATORY_CARE_PROVIDER_SITE_OTHER): Payer: Medicare Other | Admitting: Internal Medicine

## 2018-11-17 ENCOUNTER — Encounter: Payer: Self-pay | Admitting: Internal Medicine

## 2018-11-17 ENCOUNTER — Ambulatory Visit: Payer: Self-pay | Admitting: *Deleted

## 2018-11-17 ENCOUNTER — Other Ambulatory Visit: Payer: Self-pay | Admitting: *Deleted

## 2018-11-17 DIAGNOSIS — E538 Deficiency of other specified B group vitamins: Secondary | ICD-10-CM | POA: Diagnosis not present

## 2018-11-17 DIAGNOSIS — F319 Bipolar disorder, unspecified: Secondary | ICD-10-CM | POA: Diagnosis not present

## 2018-11-17 DIAGNOSIS — J449 Chronic obstructive pulmonary disease, unspecified: Secondary | ICD-10-CM

## 2018-11-17 DIAGNOSIS — N760 Acute vaginitis: Secondary | ICD-10-CM | POA: Insufficient documentation

## 2018-11-17 DIAGNOSIS — E034 Atrophy of thyroid (acquired): Secondary | ICD-10-CM | POA: Diagnosis not present

## 2018-11-17 DIAGNOSIS — J189 Pneumonia, unspecified organism: Secondary | ICD-10-CM

## 2018-11-17 DIAGNOSIS — M25512 Pain in left shoulder: Secondary | ICD-10-CM

## 2018-11-17 DIAGNOSIS — M25519 Pain in unspecified shoulder: Secondary | ICD-10-CM | POA: Insufficient documentation

## 2018-11-17 MED ORDER — FLUCONAZOLE 150 MG PO TABS: 150.0000 mg | ORAL_TABLET | Freq: Once | ORAL | 1 refills | Status: AC

## 2018-11-17 NOTE — Assessment & Plan Note (Signed)
Levothroid 

## 2018-11-17 NOTE — Patient Outreach (Signed)
Pt with 2 Red EMMI alerts on 11/15/18 for anxious, lost interest, telephone call, spoke with pt, HIPAA verified, pt reports she saw primary care MD today and discussed all issues with him, no medication changes made, pt states MD will be writing her a letter stating her home is detrimental to her health with the mold, wood heat, etc.  Pt states her anxiety and depression is something she has had for years and MD aware.  Pt reports she did talk with [facility] CSW about housing.   [name] [facility], [place] Coordinator [phone]

## 2018-11-17 NOTE — Assessment & Plan Note (Signed)
Massage  And heat

## 2018-11-17 NOTE — Assessment & Plan Note (Addendum)
Pulm f/u w/Dr Newman Nip abx

## 2018-11-17 NOTE — Assessment & Plan Note (Signed)
F/u w/Dr Kaur ?

## 2018-11-17 NOTE — Assessment & Plan Note (Signed)
1/20 post-abx Diflucan

## 2018-11-17 NOTE — Assessment & Plan Note (Signed)
Pulm f/u w/Dr Luan Pulling

## 2018-11-17 NOTE — Progress Notes (Signed)
Subjective:  Patient ID: Melody [name], female    DOB: Mar 13, 1956  Age: 63 y.o. MRN: [id number]  CC: No chief complaint on file.   HPI Melody [name] presents for a complicated pneumonia hospital stay f/u. D/c'd on 11/10/18 C/o stress, depression "I'm dying in this house - mold, burning wood for heat.Marland Kitchen.!"  Per hx: "Brief Hospitalization Summary: Please see all hospital notes, images, labs for full details of the hospitalization. Melody [name] a62 y.o.female,with history of bronchiectasis, depression, GERD, hypothyroidism, MAI came to hospital with complaint of shortness of breath, cough. Patient was recently discharged from the hospital on 10/02/2018. Patient says that she has experienced worsening shortness of breath, coughing up phlegm. She denies coughing up blood. Denies chest pain, no abdominal pain. Denies dysuria. In the ED chest x-ray showed diffusely increased interstitial opacity with mild nodularity on the right suspect for acute interstitial inflammatory infectious process superimposed on underlying chronic changes Patient started on IV Levaquin.  Brief Admission Hx: 63 y.o.female,with history of bronchiectasis, depression, GERD, hypothyroidism, MAI came to hospital with complaint of shortness of breath, cough.  MDM/Assessment & Plan:  1. Healthcare associated pneumonia-given that she was hospitalized last month, seen on chest x-ray, patient started on IV Levaquin. She is clinically improving and feeling better, ambulating in the room.Continue levofloxacin.  She is on room air.   Blood culture no growth to date. Influenza panel is negative  2. Bronchiectasis/COPD-treated with DuoNebs every 6 hours, Solu-Medrol 60 mg IV every 6 hours, Mucinex 1200 mg p.o. twice daily. Add cough medication.  Discharged home on a prednisone taper.  3. Hypokalemia-repleted.  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  Because of difficulty  with transportation, the patient has requested to follow-up and establish care with Dr. Luan Pulling so that she can be seen by a local pulmonologist.  She understands that he is retiring at the end of the year but another pulmonologist is going to be taking over that practice.  5. Hypothyroidism-continue Synthroid 6. Bipolar Disorder - Resumed home medications.  DVT prophylaxis:lovenox  Code Status:Full  Family Communication:patient  Disposition Plan:Home    Consultants:  N/a   Procedures:  n/a  Antimicrobials:  Levofloxacin 1/11 >  Discharge Diagnoses:  Principal Problem:   HCAP (healthcare-associated pneumonia) Active Problems:   Hypothyroidism   Bipolar I disorder ([place])   GERD   Chronic obstructive airway disease with asthma ([place])   Fatigue   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([place])   Hypokalemia"   Outpatient Medications Prior to Visit  Medication Sig Dispense Refill  . albuterol (PROVENTIL HFA;VENTOLIN HFA) 108 (90 Base) MCG/ACT inhaler Inhale 2 puffs into the lungs every 6 (six) hours as needed for wheezing or shortness of breath. 1 Inhaler 6  . ALPRAZolam (XANAX) 1 MG tablet Take 1 mg by mouth 4 (four) times daily.     . Azelastine-Fluticasone (DYMISTA) 137-50 MCG/ACT SUSP Place 1 spray 2 (two) times daily into the nose. 1 Bottle 3  . BREO ELLIPTA 100-25 MCG/INH AEPB inhale ONE PUFF into THE lungs DAILY 60 each 11  . cyanocobalamin (,VITAMIN B-12,) 1000 MCG/ML injection INJECT 1 ML INTO THE MUSCLE EVERY 14 DAYS. (Patient taking differently: 1,000 mcg every 14 (fourteen) days. ) 2 mL 5  . guaiFENesin (MUCINEX) 600 MG 12 hr tablet Take 2 tablets (1,200 mg total) by mouth 2 (two) times daily for 7 days. 28 tablet 0  . ketorolac (TORADOL) 10 MG tablet Take 10 mg by mouth every 6 (six) hours as needed.  for pain  0  . levocetirizine (XYZAL) 5 MG tablet TAKE 1 TABLET BY MOUTH EVERY EVENING. (Patient taking differently: Take 5 mg by mouth every evening. ) 30  tablet 11  . levothyroxine (SYNTHROID, LEVOTHROID) 50 MCG tablet TAKE ONE TABLET BY MOUTH DAILY BEFORE breakfast (Patient taking differently: Take 50 mcg by mouth daily before breakfast. ) 30 tablet 11  . methylphenidate (RITALIN) 20 MG tablet Take 1 tablet (20 mg total) by mouth daily.  0  . oxyCODONE-acetaminophen (PERCOCET/ROXICET) 5-325 MG tablet Take 1 tablet by mouth 2 (two) times daily as needed for severe pain. Fill on or after 12/09/18 60 tablet 0  . pantoprazole (PROTONIX) 40 MG tablet TAKE 1 TABLET BY MOUTH DAILY (Patient taking differently: Take 40 mg by mouth daily. ) 30 tablet 5  . predniSONE (DELTASONE) 20 MG tablet Take 3 PO QAM x3days, 2 PO QAM x3days, 1 PO QAM x3days 18 tablet 0  . QUEtiapine (SEROQUEL) 25 MG tablet Take 25 mg by mouth at bedtime.     Marland Kitchen Respiratory Therapy Supplies (FLUTTER) DEVI Use as directed 1 each 0  . triamcinolone ointment (KENALOG) 0.1 % Apply 1 application topically 2 (two) times daily. (Patient taking differently: Apply 1 application topically 2 (two) times daily as needed. ) 80 g 3  . Vitamin D, Ergocalciferol, (DRISDOL) 1.25 MG (50000 UT) CAPS capsule TAKE ONE CAPSULE BY MOUTH EVERY SEVEN DAYS (Patient taking differently: Take 50,000 Units by mouth every 7 (seven) days. ) 12 capsule 0   No facility-administered medications prior to visit.     ROS: Review of Systems  Constitutional: Positive for fatigue. Negative for activity change, appetite change, chills and unexpected weight change.  HENT: Positive for congestion. Negative for mouth sores and sinus pressure.   Eyes: Negative for visual disturbance.  Respiratory: Positive for cough and shortness of breath. Negative for chest tightness.   Gastrointestinal: Negative for abdominal pain and nausea.  Genitourinary: Negative for difficulty urinating, frequency and vaginal pain.  Musculoskeletal: Negative for back pain and gait problem.  Skin: Negative for pallor and rash.  Neurological: Negative for  dizziness, tremors, weakness, numbness and headaches.  Psychiatric/Behavioral: Positive for dysphoric mood. Negative for confusion, sleep disturbance and suicidal ideas. The patient is nervous/anxious.     Objective:  BP (!) 156/92 (BP Location: Left Arm, Patient Position: Sitting, Cuff Size: Normal)   Pulse 83   Temp 98.4 F (36.9 C) (Oral)   Ht 5\' 6"  (1.676 m)   Wt 147 lb (66.7 kg)   LMP 10/29/2006 (Approximate)   SpO2 98%   BMI 23.73 kg/m   BP Readings from Last 3 Encounters:  11/17/18 (!) 156/92  11/10/18 (!) 148/78  10/17/18 124/76    Wt Readings from Last 3 Encounters:  11/17/18 147 lb (66.7 kg)  11/08/18 138 lb 0.1 oz (62.6 kg)  10/17/18 140 lb (63.5 kg)    Physical Exam Constitutional:      General: She is not in acute distress.    Appearance: She is well-developed.  HENT:     Head: Normocephalic.     Right Ear: External ear normal.     Left Ear: External ear normal.     Nose: Nose normal.  Eyes:     General:        Right eye: No discharge.        Left eye: No discharge.     Conjunctiva/sclera: Conjunctivae normal.     Pupils: Pupils are equal, round, and reactive to Elena.  Neck:     Musculoskeletal: Normal range of motion and neck supple.     Thyroid: No thyromegaly.     Vascular: No JVD.     Trachea: No tracheal deviation.  Cardiovascular:     Rate and Rhythm: Normal rate and regular rhythm.     Heart sounds: Normal heart sounds.  Pulmonary:     Effort: No respiratory distress.     Breath sounds: No stridor. No wheezing.  Abdominal:     General: Bowel sounds are normal. There is no distension.     Palpations: Abdomen is soft. There is no mass.     Tenderness: There is no abdominal tenderness. There is no guarding or rebound.  Musculoskeletal:        General: No tenderness.  Lymphadenopathy:     Cervical: No cervical adenopathy.  Skin:    Findings: No erythema or rash.  Neurological:     Cranial Nerves: No cranial nerve deficit.     Motor:  No abnormal muscle tone.     Coordination: Coordination normal.     Deep Tendon Reflexes: Reflexes normal.  Psychiatric:        Thought Content: Thought content normal.        Judgment: Judgment normal.   tearful, upset - much better by the end of the visit L shoulder is tender  Lab Results  Component Value Date   WBC 9.3 11/08/2018   HGB 11.2 (L) 11/08/2018   HCT 34.8 (L) 11/08/2018   PLT 133 (L) 11/08/2018   GLUCOSE 149 (H) 11/09/2018   CHOL 180 05/16/2018   TRIG 67.0 05/16/2018   HDL 69.80 05/16/2018   LDLCALC 96 05/16/2018   ALT 9 11/08/2018   AST 13 (L) 11/08/2018   NA 140 11/09/2018   K 3.8 11/09/2018   CL 114 (H) 11/09/2018   CREATININE 0.63 11/09/2018   BUN 11 11/09/2018   CO2 22 11/09/2018   TSH 3.055 10/02/2018    Dg Chest 2 View  Result Date: 11/08/2018 CLINICAL DATA:  Cough EXAM: CHEST - 2 VIEW COMPARISON:  09/30/2018, 07/03/2017, CT 07/22/2017 FINDINGS: Increased bilateral interstitial opacity with mild nodularity on the right. No consolidation or effusion. Normal cardiomediastinal silhouette. No pneumothorax. IMPRESSION: Diffusely increased interstitial opacity with mild nodularity on the right, suspect for acute interstitial inflammatory or infectious process superimposed on underlying chronic changes. Electronically Signed   By: Donavan Foil M.D.   On: 11/08/2018 00:18    Assessment & Plan:   There are no diagnoses linked to this encounter.   No orders of the defined types were placed in this encounter.    Follow-up: No follow-ups on file.  Walker Kehr, MD

## 2018-11-17 NOTE — Assessment & Plan Note (Signed)
On B12 

## 2018-11-18 ENCOUNTER — Telehealth: Payer: Self-pay | Admitting: *Deleted
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
## 2018-11-23 DIAGNOSIS — M79605 Pain in left leg: Secondary | ICD-10-CM | POA: Diagnosis not present

## 2018-11-23 DIAGNOSIS — Z79899 Other long term (current) drug therapy: Secondary | ICD-10-CM | POA: Diagnosis not present

## 2018-11-23 DIAGNOSIS — W06XXXA Fall from bed, initial encounter: Secondary | ICD-10-CM | POA: Diagnosis not present

## 2018-11-23 DIAGNOSIS — S00212A Abrasion of left eyelid and periocular area, initial encounter: Secondary | ICD-10-CM | POA: Diagnosis not present

## 2018-11-23 DIAGNOSIS — Z87891 Personal history of nicotine dependence: Secondary | ICD-10-CM | POA: Diagnosis not present

## 2018-11-23 DIAGNOSIS — S4992XA Unspecified injury of left shoulder and upper arm, initial encounter: Secondary | ICD-10-CM | POA: Diagnosis not present

## 2018-11-23 DIAGNOSIS — S40012A Contusion of left shoulder, initial encounter: Secondary | ICD-10-CM | POA: Diagnosis not present

## 2018-11-23 DIAGNOSIS — S199XXA Unspecified injury of neck, initial encounter: Secondary | ICD-10-CM | POA: Diagnosis not present

## 2018-11-23 DIAGNOSIS — S0083XA Contusion of other part of head, initial encounter: Secondary | ICD-10-CM | POA: Diagnosis not present

## 2018-11-23 DIAGNOSIS — M79602 Pain in left arm: Secondary | ICD-10-CM | POA: Diagnosis not present

## 2018-11-24 ENCOUNTER — Ambulatory Visit: Payer: Self-pay | Admitting: *Deleted

## 2018-11-26 ENCOUNTER — Other Ambulatory Visit: Payer: Self-pay | Admitting: *Deleted

## 2018-11-26 NOTE — Patient Outreach (Signed)
Telephone call to pt for follow up, no answer to telephone and no option to leave voicemail. RN CM spoke with [facility] CSW Raynaldo Opitz who reports she has spoken with pt and has also given pt multiple resources.    PLAN Call pt next month for telephone assessment  [name] [facility], [place] Coordinator [phone]

## 2018-11-28 ENCOUNTER — Other Ambulatory Visit: Payer: Self-pay | Admitting: *Deleted

## 2018-11-28 NOTE — Patient Outreach (Signed)
Pt called RN CM and left voicemail requesting return phone call,  RN CM called pt back, HIPAA verified, pt reports she asked her primary care MD to write her a letter to take to social security and states " I ask for the letter but not sure what to do with it"  Pt rambles and talks fast, jumps from subject to subject. Pt states she is going to need transportation for 2 appointments 2/18 in [place] and 2/25 in [place].  Pt states she "thinks she has medicaid" and asks " if I do, who is my case worker at social services" RN CM gave pt the phone number to social services, pt states she will call.  Pt requests to speak with [facility] CSW. RN CM called [facility] CSW Raynaldo Opitz and ask her to call pt.  [name] [facility], [place] Coordinator [phone]

## 2018-12-10 ENCOUNTER — Ambulatory Visit (INDEPENDENT_AMBULATORY_CARE_PROVIDER_SITE_OTHER): Payer: Medicare Other | Admitting: Internal Medicine

## 2018-12-10 ENCOUNTER — Encounter: Payer: Self-pay | Admitting: Internal Medicine

## 2018-12-10 VITALS — BP 140/76 | HR 71 | Temp 98.0°F | Ht 66.0 in | Wt 149.0 lb

## 2018-12-10 DIAGNOSIS — M7522 Bicipital tendinitis, left shoulder: Secondary | ICD-10-CM | POA: Diagnosis not present

## 2018-12-10 DIAGNOSIS — M25512 Pain in left shoulder: Secondary | ICD-10-CM

## 2018-12-10 DIAGNOSIS — M7520 Bicipital tendinitis, unspecified shoulder: Secondary | ICD-10-CM | POA: Insufficient documentation

## 2018-12-10 NOTE — Progress Notes (Signed)
Subjective:  Patient ID: Melody [name], female    DOB: [birth year]  Age: 63 y.o. MRN: [id number]  CC: No chief complaint on file.   HPI Melody [name] presents for L shoulder pain x 1 month C/o a fall a couple weeks ago and went to ER (X ray - no fx). Pain is worse  Outpatient Medications Prior to Visit  Medication Sig Dispense Refill  . albuterol (PROVENTIL HFA;VENTOLIN HFA) 108 (90 Base) MCG/ACT inhaler Inhale 2 puffs into the lungs every 6 (six) hours as needed for wheezing or shortness of breath. 1 Inhaler 6  . ALPRAZolam (XANAX) 1 MG tablet Take 1 mg by mouth 4 (four) times daily.     . Azelastine-Fluticasone (DYMISTA) 137-50 MCG/ACT SUSP Place 1 spray 2 (two) times daily into the nose. 1 Bottle 3  . BREO ELLIPTA 100-25 MCG/INH AEPB inhale ONE PUFF into THE lungs DAILY 60 each 11  . cyanocobalamin (,VITAMIN B-12,) 1000 MCG/ML injection INJECT 1 ML INTO THE MUSCLE EVERY 14 DAYS. (Patient taking differently: 1,000 mcg every 14 (fourteen) days. ) 2 mL 5  . ketorolac (TORADOL) 10 MG tablet Take 10 mg by mouth every 6 (six) hours as needed. for pain  0  . levocetirizine (XYZAL) 5 MG tablet TAKE 1 TABLET BY MOUTH EVERY EVENING. (Patient taking differently: Take 5 mg by mouth every evening. ) 30 tablet 11  . levothyroxine (SYNTHROID, LEVOTHROID) 50 MCG tablet TAKE ONE TABLET BY MOUTH DAILY BEFORE breakfast (Patient taking differently: Take 50 mcg by mouth daily before breakfast. ) 30 tablet 11  . methylphenidate (RITALIN) 20 MG tablet Take 1 tablet (20 mg total) by mouth daily.  0  . oxyCODONE-acetaminophen (PERCOCET/ROXICET) 5-325 MG tablet Take 1 tablet by mouth 2 (two) times daily as needed for severe pain. Fill on or after 12/09/18 60 tablet 0  . pantoprazole (PROTONIX) 40 MG tablet TAKE 1 TABLET BY MOUTH DAILY (Patient taking differently: Take 40 mg by mouth daily. ) 30 tablet 5  . predniSONE (DELTASONE) 20 MG tablet Take 3 PO QAM x3days, 2 PO QAM x3days, 1 PO QAM x3days 18 tablet 0  .  QUEtiapine (SEROQUEL) 25 MG tablet Take 25 mg by mouth at bedtime.     Marland Kitchen Respiratory Therapy Supplies (FLUTTER) DEVI Use as directed 1 each 0  . triamcinolone ointment (KENALOG) 0.1 % Apply 1 application topically 2 (two) times daily. (Patient taking differently: Apply 1 application topically 2 (two) times daily as needed. ) 80 g 3  . Vitamin D, Ergocalciferol, (DRISDOL) 1.25 MG (50000 UT) CAPS capsule TAKE ONE CAPSULE BY MOUTH EVERY SEVEN DAYS (Patient taking differently: Take 50,000 Units by mouth every 7 (seven) days. ) 12 capsule 0   No facility-administered medications prior to visit.     ROS: Review of Systems  Constitutional: Positive for fatigue. Negative for activity change, appetite change, chills and unexpected weight change.  HENT: Negative for congestion, mouth sores and sinus pressure.   Eyes: Negative for visual disturbance.  Respiratory: Negative for cough and chest tightness.   Gastrointestinal: Negative for abdominal pain and nausea.  Genitourinary: Negative for difficulty urinating, frequency and vaginal pain.  Musculoskeletal: Positive for arthralgias and back pain. Negative for gait problem.  Skin: Negative for pallor and rash.  Neurological: Negative for dizziness, tremors, weakness, numbness and headaches.  Psychiatric/Behavioral: Positive for dysphoric mood. Negative for confusion, sleep disturbance and suicidal ideas. The patient is nervous/anxious.     Objective:  BP 140/76 (BP Location: Right Arm,  Patient Position: Sitting, Cuff Size: Normal)   Pulse 71   Temp 98 F (36.7 C) (Oral)   Ht 5\' 6"  (1.676 m)   Wt 149 lb (67.6 kg)   LMP 10/29/2006 (Approximate)   SpO2 96%   BMI 24.05 kg/m   BP Readings from Last 3 Encounters:  12/10/18 140/76  11/17/18 (!) 156/92  11/10/18 (!) 148/78    Wt Readings from Last 3 Encounters:  12/10/18 149 lb (67.6 kg)  11/17/18 147 lb (66.7 kg)  11/08/18 138 lb 0.1 oz (62.6 kg)    Physical Exam Constitutional:       General: She is not in acute distress.    Appearance: She is well-developed.  HENT:     Head: Normocephalic.     Right Ear: External ear normal.     Left Ear: External ear normal.     Nose: Nose normal.  Eyes:     General:        Right eye: No discharge.        Left eye: No discharge.     Conjunctiva/sclera: Conjunctivae normal.     Pupils: Pupils are equal, round, and reactive to Schrum.  Neck:     Musculoskeletal: Normal range of motion and neck supple.     Thyroid: No thyromegaly.     Vascular: No JVD.     Trachea: No tracheal deviation.  Cardiovascular:     Rate and Rhythm: Normal rate and regular rhythm.     Heart sounds: Normal heart sounds.  Pulmonary:     Effort: No respiratory distress.     Breath sounds: No stridor. No wheezing.  Abdominal:     General: Bowel sounds are normal. There is no distension.     Palpations: Abdomen is soft. There is no mass.     Tenderness: There is no abdominal tenderness. There is no guarding or rebound.  Musculoskeletal:        General: Tenderness present.  Lymphadenopathy:     Cervical: No cervical adenopathy.  Skin:    Findings: No erythema or rash.  Neurological:     Cranial Nerves: No cranial nerve deficit.     Motor: No abnormal muscle tone.     Coordination: Coordination normal.     Deep Tendon Reflexes: Reflexes normal.  Psychiatric:        Behavior: Behavior normal.        Thought Content: Thought content normal.        Judgment: Judgment normal.   L shoulder w/pain on palpation and w/ROM L biceps tendon - painful   Procedure :Joint Injection, L shoulder   Indication:  Subacromial bursitis with refractory  chronic pain.   Risks including unsuccessful procedure , bleeding, infection, bruising, skin atrophy, "steroid flare-up" and others were explained to the patient in detail as well as the benefits. Informed consent was obtained and signed.   Tthe patient was placed in a comfortable position. Lateral approach was  used. Skin was prepped with Betadine and alcohol  and anesthetized with a cooling spray. Then, a 5 cc syringe with a 2 inch long 24-gauge needle was used for a joint injection.. The needle was advanced  Into the subacromial space.The bursa was injected with 3 mL of 2% lidocaine and 40 mg of Depo-Medrol .  Band-Aid was applied.   Tolerated well. Complications: None. Good pain relief following the procedure.      Procedure :  Biceps tendon injection L   Indication:  Bicipital tendonitis with refractory  chronic pain.   Risks including unsuccessful procedure , bleeding, infection, bruising, skin atrophy and others were explained to the patient in detail as well as the benefits. Informed consent was obtained and signed.   Tthe patient was placed in a comfortable position. Skin was prepped with Betadine and alcohol  and anesthetized with a cooling spray. Then, a 5 cc syringe with a 1.5 inch long 25-gauge needle was used for a tendon injection with 2 mL of 2% lidocaine and 20 mg of Depo-Medrol .  Band-Aid was applied.     Tolerated well. Complications: None. Good pain relief following the procedure.     Lab Results  Component Value Date   WBC 9.3 11/08/2018   HGB 11.2 (L) 11/08/2018   HCT 34.8 (L) 11/08/2018   PLT 133 (L) 11/08/2018   GLUCOSE 149 (H) 11/09/2018   CHOL 180 05/16/2018   TRIG 67.0 05/16/2018   HDL 69.80 05/16/2018   LDLCALC 96 05/16/2018   ALT 9 11/08/2018   AST 13 (L) 11/08/2018   NA 140 11/09/2018   K 3.8 11/09/2018   CL 114 (H) 11/09/2018   CREATININE 0.63 11/09/2018   BUN 11 11/09/2018   CO2 22 11/09/2018   TSH 3.055 10/02/2018    Dg Chest 2 View  Result Date: 11/08/2018 CLINICAL DATA:  Cough EXAM: CHEST - 2 VIEW COMPARISON:  09/30/2018, 07/03/2017, CT 07/22/2017 FINDINGS: Increased bilateral interstitial opacity with mild nodularity on the right. No consolidation or effusion. Normal cardiomediastinal silhouette. No pneumothorax. IMPRESSION: Diffusely  increased interstitial opacity with mild nodularity on the right, suspect for acute interstitial inflammatory or infectious process superimposed on underlying chronic changes. Electronically Signed   By: Donavan Foil M.D.   On: 11/08/2018 00:18    Assessment & Plan:   There are no diagnoses linked to this encounter.   No orders of the defined types were placed in this encounter.    Follow-up: No follow-ups on file.  Walker Kehr, MD

## 2018-12-10 NOTE — Assessment & Plan Note (Signed)
Will inject °

## 2018-12-10 NOTE — Patient Instructions (Addendum)
Postprocedure instructions :    A Band-Aid should be left on for 12 hours. Injection therapy is not a cure itself. It is used in conjunction with other modalities. You can use nonsteroidal anti-inflammatories like ibuprofen , hot and cold compresses. Rest is recommended in the next 24 hours. You need to report immediately  if fever, chills or any signs of infection develop.  Shoulder Range of Motion Exercises Shoulder range of motion (ROM) exercises are done to keep the shoulder moving freely or to increase movement. They are often recommended for people who have shoulder pain or stiffness or who are recovering from a shoulder surgery. Phase 1 exercises When you are able, do this exercise 1-2 times per day for 30-60 seconds in each direction, or as directed by your health care provider. Pendulum exercise To do this exercise while sitting: 1. Sit in a chair or at the edge of your bed with your feet flat on the floor. 2. Let your affected arm hang down in front of you over the edge of the bed or chair. 3. Relax your shoulder, arm, and hand. Tama your body so your arm gently swings in small circles. You can also use your unaffected arm to start the motion. 5. Repeat changing the direction of the circles, swinging your arm left and right, and swinging your arm forward and back. To do this exercise while standing: 1. Stand next to a sturdy chair or table, and hold on to it with your hand on your unaffected side. 2. Bend forward at the waist. 3. Bend your knees slightly. 4. Relax your shoulder, arm, and hand. 5. While keeping your shoulder relaxed, use body motion to swing your arm in small circles. 6. Repeat changing the direction of the circles, swinging your arm left and right, and swinging your arm forward and back. 7. Between exercises, stand up tall and take a short break to relax your lower back.  Phase 2 exercises Do these exercises 1-2 times per day or as told by your health care  provider. Hold each stretch for 30 seconds, and repeat 3 times. Do the exercises with one or both arms as instructed by your health care provider. For these exercises, sit at a table with your hand and arm supported by the table. A chair that slides easily or has wheels can be helpful. External rotation 1. Turn your chair so that your affected side is nearest to the table. 2. Place your forearm on the table to your side. Bend your elbow about 90 at the elbow (right angle) and place your hand palm facing down on the table. Your elbow should be about 6 inches away from your side. 3. Keeping your arm on the table, lean your body forward. Abduction 1. Turn your chair so that your affected side is nearest to the table. 2. Place your forearm and hand on the table so that your thumb points toward the ceiling and your arm is straight out to your side. 3. Slide your hand out to the side and away from you, using your unaffected arm to do the work. 4. To increase the stretch, you can slide your chair away from the table. Flexion: forward stretch 1. Sit facing the table. Place your hand and elbow on the table in front of you. 2. Slide your hand forward and away from you, using your unaffected arm to do the work. 3. To increase the stretch, you can slide your chair backward. Phase 3 exercises Do these  exercises 1-2 times per day or as told by your health care provider. Hold each stretch for 30 seconds, and repeat 3 times. Do the exercises with one or both arms as instructed by your health care provider. Cross-body stretch: posterior capsule stretch 1. Lift your arm straight out in front of you. 2. Bend your arm 90 at the elbow (right angle) so your forearm moves across your body. 3. Use your other arm to gently pull the elbow across your body, toward your other shoulder. Wall climbs 1. Stand with your affected arm extended out to the side with your hand resting on a door frame. 2. Slide your hand slowly  up the door frame. 3. To increase the stretch, step through the door frame. Keep your body upright and do not lean. Wand exercises You will need a cane, a piece of PVC pipe, or a sturdy wooden dowel for wand exercises. Flexion To do this exercise while standing: 1. Hold the wand with both of your hands, palms down. 2. Using the other arm to help, lift your arms up and over your head, if able. 3. Push upward with your other arm to gently increase the stretch. To do this exercise while lying down: 1. Lie on your back with your elbows resting on the floor and the wand in both your hands. Your hands will be palm down, or pointing toward your feet. 2. Lift your hands toward the ceiling, using your unaffected arm to help if needed. 3. Bring your arms overhead as able, using your unaffected arm to help if needed. Internal rotation 1. Stand while holding the wand behind you with both hands. Your unaffected arm should be extended above your head with the arm of the affected side extended behind you at the level of your waist. The wand should be pointing straight up and down as you hold it. 2. Slowly pull the wand up behind your back by straightening the elbow of your unaffected arm and bending the elbow of your affected arm. External rotation 1. Lie on your back with your affected upper arm supported on a small pillow or rolled towel. When you first do this exercise, keep your upper arm close to your body. Over time, bring your arm up to a 90 angle out to the side. 2. Hold the wand across your stomach and with both hands palm up. Your elbow on your affected side should be bent at a 90 angle. 3. Use your unaffected side to help push your forearm away from you and toward the floor. Keep your elbow on your affected side bent at a 90 angle. Contact a health care provider if you have:  New or increasing pain.  New numbness, tingling, weakness, or discoloration in your arm or hand. This information is  not intended to replace advice given to you by your health care provider. Make sure you discuss any questions you have with your health care provider. Document Released: 07/14/2003 Document Revised: 11/27/2017 Document Reviewed: 11/27/2017 Elsevier Interactive Patient Education  2019 [employer].

## 2018-12-10 NOTE — Assessment & Plan Note (Signed)
See procedure 

## 2018-12-14 ENCOUNTER — Other Ambulatory Visit: Payer: Self-pay | Admitting: Internal Medicine

## 2018-12-14 ENCOUNTER — Other Ambulatory Visit: Payer: Self-pay | Admitting: Family Medicine

## 2018-12-15 ENCOUNTER — Encounter: Payer: Self-pay | Admitting: *Deleted

## 2018-12-15 ENCOUNTER — Other Ambulatory Visit: Payer: Self-pay | Admitting: *Deleted

## 2018-12-15 NOTE — Patient Outreach (Signed)
Outreach call to pt for telephone assessment, spoke with pt who states "my situation is the same, still stuck in this moldy house"  RN CM attempted to review resources with pt and pt gets agitated and gives reasons as to why she has not checked into resources, for every scenario such as condemning the house, walking away from the house, checking into [place] resources for housing, living with a relative for a short while, etc. Pt gives a reason of why she cannot do any of it.  Pt mentions a complex situation with taxes, SSI being taken away, legal aspects, (pt has no money for a Chief Executive Officer), RN CM reported today's issues to [place].    [facility] CM Care Plan Problem One     Most Recent Value  Care Plan Problem One  Difficulty coping with and having resolution of recent diagnosis pneumonia due to environmental factors  Role Documenting the Problem One  Care Management [place] for Problem One  Active  [facility] Long Term Goal   Pt will demonstrate improved understanding of self care for pneumonia and working with social work for resources/ solutions related to environmental factors in the home within 60 days  [place] Term Goal Start Date  11/12/18  Interventions for Problem One Long Term Goal  RN CM reviewed plan of care with pt, unable to discuss very few medical/ health issues today due to pt discussing her housing situation,  RN CM called [facility] LCSW Raynaldo Opitz and gave update,  RN CM called [employer] and was told pt needs to go through city [place] due to she is [place] in the city [place],  Therapist, sports CM called city of [place] and was on hold and unable to leave voicemail or speak with anyone,  RN CM gave pt the contact information and phone number for city of [place] and urged pt to go in and talk with someone about having house condemned and getting a demolition permit (as advised by person at the county)  [facility] CM Short Term Goal #1   Pt will show improvement of respiratory condition  (pneumonia, chronic airway disease with asthma) within 30 days  [facility] CM Short Term Goal #1 Start Date  11/12/18  [facility] CM Short Term Goal #1 Met Date  12/15/18  Interventions for Short Term Goal #1  pt reports symptoms are "better" but pt still lives in fear that due to mold she will be back in hospital.    [facility] CM Care Plan Problem Two     Most Recent Value  Care Plan Problem Two  Difficulty coping due to anxiety and depression  Role Documenting the Problem Two  Care Management Coordinator  Care Plan for Problem Two  Active  Interventions for Problem Two Long Term Goal   RN CM allowed pt to ventilate, pt feels her "mind is all over the place" and she is not checking into resoures that she has on hand.  RN CM reviewed list of housing with pt and pt had a reason for every place as to why she could not live there (e.g. drug infested, high ceilings, murder occured, etc.)  [facility] Long Term Goal  Pt will demonstrate improved coping mechanisms related to anxiety and depression within 60 days  [facility] Long Term Goal Start Date  11/12/18      PLAN Outreach pt next month for telephone assessment Collaborate with [facility] CSW as needed  [name] [facility] [place]  Coordinator [phone]

## 2018-12-16 DIAGNOSIS — R002 Palpitations: Secondary | ICD-10-CM | POA: Diagnosis not present

## 2018-12-16 DIAGNOSIS — R918 Other nonspecific abnormal finding of lung field: Secondary | ICD-10-CM | POA: Diagnosis not present

## 2018-12-16 DIAGNOSIS — J449 Chronic obstructive pulmonary disease, unspecified: Secondary | ICD-10-CM | POA: Diagnosis not present

## 2018-12-16 DIAGNOSIS — J189 Pneumonia, unspecified organism: Secondary | ICD-10-CM | POA: Diagnosis not present

## 2018-12-23 ENCOUNTER — Encounter: Payer: Self-pay | Admitting: Internal Medicine

## 2018-12-23 ENCOUNTER — Ambulatory Visit (INDEPENDENT_AMBULATORY_CARE_PROVIDER_SITE_OTHER): Payer: Medicare Other | Admitting: Internal Medicine

## 2018-12-23 DIAGNOSIS — M25512 Pain in left shoulder: Secondary | ICD-10-CM | POA: Diagnosis not present

## 2018-12-23 DIAGNOSIS — E538 Deficiency of other specified B group vitamins: Secondary | ICD-10-CM

## 2018-12-23 DIAGNOSIS — R457 State of emotional shock and stress, unspecified: Secondary | ICD-10-CM

## 2018-12-23 MED ORDER — OXYCODONE-ACETAMINOPHEN 5-325 MG PO TABS: 1.0000 | ORAL_TABLET | Freq: Two times a day (BID) | ORAL | 0 refills | Status: DC | PRN

## 2018-12-23 NOTE — Progress Notes (Signed)
Subjective:  Patient ID: Melody [name], female    DOB: 1956/05/10  Age: 63 y.o. MRN: [id number]  CC: No chief complaint on file.   HPI ANALEIGH ARIES presents for L shoulder pain - it is better in some ways, not better in the others... percocet helps a little - she had to take 1.5 tabs at times F/u anxiety, hypothyroidism  Outpatient Medications Prior to Visit  Medication Sig Dispense Refill  . albuterol (PROVENTIL HFA;VENTOLIN HFA) 108 (90 Base) MCG/ACT inhaler Inhale 2 puffs into the lungs every 6 (six) hours as needed for wheezing or shortness of breath. 1 Inhaler 6  . ALPRAZolam (XANAX) 1 MG tablet Take 1 mg by mouth 4 (four) times daily.     . Azelastine-Fluticasone (DYMISTA) 137-50 MCG/ACT SUSP Place 1 spray 2 (two) times daily into the nose. 1 Bottle 3  . BREO ELLIPTA 100-25 MCG/INH AEPB inhale ONE PUFF into THE lungs DAILY 60 each 11  . cyanocobalamin (,VITAMIN B-12,) 1000 MCG/ML injection INJECT 1 ML INTO THE MUSCLE EVERY 14 DAYS. (Patient taking differently: 1,000 mcg every 14 (fourteen) days. ) 2 mL 5  . ketorolac (TORADOL) 10 MG tablet Take 10 mg by mouth every 6 (six) hours as needed. for pain  0  . levocetirizine (XYZAL) 5 MG tablet TAKE 1 TABLET BY MOUTH EVERY EVENING. (Patient taking differently: Take 5 mg by mouth every evening. ) 30 tablet 11  . levothyroxine (SYNTHROID, LEVOTHROID) 50 MCG tablet TAKE ONE TABLET BY MOUTH DAILY BEFORE breakfast (Patient taking differently: Take 50 mcg by mouth daily before breakfast. ) 30 tablet 11  . methylphenidate (RITALIN) 20 MG tablet Take 1 tablet (20 mg total) by mouth daily.  0  . oxyCODONE-acetaminophen (PERCOCET/ROXICET) 5-325 MG tablet Take 1 tablet by mouth 2 (two) times daily as needed for severe pain. Fill on or after 12/09/18 60 tablet 0  . pantoprazole (PROTONIX) 40 MG tablet TAKE 1 TABLET BY MOUTH DAILY 30 tablet 5  . predniSONE (DELTASONE) 20 MG tablet Take 3 PO QAM x3days, 2 PO QAM x3days, 1 PO QAM x3days 18 tablet 0  .  QUEtiapine (SEROQUEL) 25 MG tablet Take 25 mg by mouth at bedtime.     Marland Kitchen Respiratory Therapy Supplies (FLUTTER) DEVI Use as directed 1 each 0  . triamcinolone ointment (KENALOG) 0.1 % Apply 1 application topically 2 (two) times daily. (Patient taking differently: Apply 1 application topically 2 (two) times daily as needed. ) 80 g 3  . Vitamin D, Ergocalciferol, (DRISDOL) 1.25 MG (50000 UT) CAPS capsule TAKE ONE CAPSULE BY MOUTH EVERY SEVEN DAYS 12 capsule 0   No facility-administered medications prior to visit.     ROS: Review of Systems  Constitutional: Positive for fatigue. Negative for activity change, appetite change, chills and unexpected weight change.  HENT: Negative for congestion, mouth sores and sinus pressure.   Eyes: Negative for visual disturbance.  Respiratory: Negative for cough and chest tightness.   Gastrointestinal: Negative for abdominal pain and nausea.  Genitourinary: Negative for difficulty urinating, frequency and vaginal pain.  Musculoskeletal: Positive for arthralgias. Negative for back pain and gait problem.  Skin: Negative for pallor and rash.  Neurological: Negative for dizziness, tremors, weakness, numbness and headaches.  Psychiatric/Behavioral: Positive for dysphoric mood. Negative for confusion, sleep disturbance and suicidal ideas. The patient is nervous/anxious.     Objective:  LMP 10/29/2006 (Approximate)   BP Readings from Last 3 Encounters:  12/10/18 140/76  11/17/18 (!) 156/92  11/10/18 (!) 148/78  Wt Readings from Last 3 Encounters:  12/10/18 149 lb (67.6 kg)  11/17/18 147 lb (66.7 kg)  11/08/18 138 lb 0.1 oz (62.6 kg)    Physical Exam Constitutional:      General: She is not in acute distress.    Appearance: She is well-developed.  HENT:     Head: Normocephalic.     Right Ear: External ear normal.     Left Ear: External ear normal.     Nose: Nose normal.  Eyes:     General:        Right eye: No discharge.        Left eye: No  discharge.     Conjunctiva/sclera: Conjunctivae normal.     Pupils: Pupils are equal, round, and reactive to [place].  Neck:     Musculoskeletal: Normal range of motion and neck supple.     Thyroid: No thyromegaly.     Vascular: No JVD.     Trachea: No tracheal deviation.  Cardiovascular:     Rate and Rhythm: Normal rate and regular rhythm.     Heart sounds: Normal heart sounds.  Pulmonary:     Effort: No respiratory distress.     Breath sounds: No stridor. No wheezing.  Abdominal:     General: Bowel sounds are normal. There is no distension.     Palpations: Abdomen is soft. There is no mass.     Tenderness: There is no abdominal tenderness. There is no guarding or rebound.  Musculoskeletal:        General: Tenderness present.  Lymphadenopathy:     Cervical: No cervical adenopathy.  Skin:    Findings: No erythema or rash.  Neurological:     Cranial Nerves: No cranial nerve deficit.     Motor: No abnormal muscle tone.     Coordination: Coordination normal.     Deep Tendon Reflexes: Reflexes normal.  Psychiatric:        Behavior: Behavior normal.        Thought Content: Thought content normal.        Judgment: Judgment normal.    L shoulder - less painful w/ better ROM than 3 wks ago  Lab Results  Component Value Date   WBC 9.3 11/08/2018   HGB 11.2 (L) 11/08/2018   HCT 34.8 (L) 11/08/2018   PLT 133 (L) 11/08/2018   GLUCOSE 149 (H) 11/09/2018   CHOL 180 05/16/2018   TRIG 67.0 05/16/2018   HDL 69.80 05/16/2018   LDLCALC 96 05/16/2018   ALT 9 11/08/2018   AST 13 (L) 11/08/2018   NA 140 11/09/2018   K 3.8 11/09/2018   CL 114 (H) 11/09/2018   CREATININE 0.63 11/09/2018   BUN 11 11/09/2018   CO2 22 11/09/2018   TSH 3.055 10/02/2018    Dg Chest 2 View  Result Date: 11/08/2018 CLINICAL DATA:  Cough EXAM: CHEST - 2 VIEW COMPARISON:  09/30/2018, 07/03/2017, CT 07/22/2017 FINDINGS: Increased bilateral interstitial opacity with mild nodularity on the right. No  consolidation or effusion. Normal cardiomediastinal silhouette. No pneumothorax. IMPRESSION: Diffusely increased interstitial opacity with mild nodularity on the right, suspect for acute interstitial inflammatory or infectious process superimposed on underlying chronic changes. Electronically Signed   By: Donavan Foil M.D.   On: 11/08/2018 00:18    Assessment & Plan:   There are no diagnoses linked to this encounter.   No orders of the defined types were placed in this encounter.    Follow-up: No follow-ups on file.  Walker Kehr, MD

## 2018-12-23 NOTE — Assessment & Plan Note (Signed)
Discussed - no change

## 2018-12-23 NOTE — Assessment & Plan Note (Addendum)
L shoulder - less painful w/ better ROM than 3 wks ago The pt was  referred to [facility]

## 2018-12-23 NOTE — Assessment & Plan Note (Signed)
B 12

## 2018-12-23 NOTE — Patient Instructions (Addendum)
Rice sock for [place]

## 2018-12-29 ENCOUNTER — Telehealth: Payer: Self-pay

## 2018-12-29 DIAGNOSIS — M25512 Pain in left shoulder: Secondary | ICD-10-CM

## 2018-12-29 NOTE — Telephone Encounter (Signed)
Copied from [place] [phone]. Topic: Referral - Question >> Dec 29, 2018  8:43 AM Virl Axe D wrote: Reason for CRM: Pt called and stated that she is uncomfortable seeing the providers at [facility]. She stated she has heard/read that there have been multiple lawsuits against the providers there. She is requesting that Dr. Alain Marion refer her to another location that takes her insurance. A [place] location is okay. Please contact pt with questions or once a new referral has been placed.

## 2018-12-31 ENCOUNTER — Ambulatory Visit: Payer: Medicare Other | Admitting: Orthopaedic Surgery

## 2018-12-31 NOTE — Telephone Encounter (Signed)
Patient called back with her insurance representative on the line. They stated that they will not need the referral for [facility] because her insurance is not accepted there but she will be seen this doctor that does not require a referral. Name of ortho doctor is Tania Ade Ph# [phone]

## 2019-01-01 NOTE — Telephone Encounter (Signed)
I do not understand.  I need to place a referral for her to see Dr. Tamera Punt? Thank you

## 2019-01-01 NOTE — Telephone Encounter (Signed)
No referral needed, pt seen at GI office

## 2019-01-06 DIAGNOSIS — M67912 Unspecified disorder of synovium and tendon, left shoulder: Secondary | ICD-10-CM | POA: Diagnosis not present

## 2019-01-06 DIAGNOSIS — M25512 Pain in left shoulder: Secondary | ICD-10-CM | POA: Diagnosis not present

## 2019-01-09 ENCOUNTER — Other Ambulatory Visit: Payer: Self-pay | Admitting: Internal Medicine

## 2019-01-11 ENCOUNTER — Other Ambulatory Visit: Payer: Self-pay | Admitting: Internal Medicine

## 2019-01-12 ENCOUNTER — Other Ambulatory Visit: Payer: Self-pay

## 2019-01-12 ENCOUNTER — Other Ambulatory Visit: Payer: Self-pay | Admitting: *Deleted

## 2019-01-12 NOTE — Patient Outreach (Signed)
Outreach call to pt for telephone assessment, spoke with pt, HIPAA verified, pt reports she has all medications and taking as prescribed, saw pulmonologist Dr. Luan Pulling and bloodwork has been ordered and pt states "I put it off and now I don't know when I'll go because of this corona virus" Pt saw MD and had cortisone put in right shoulder and scheduled for outpatient PT in [place], pt reports " breathing ok, no worse"  Pt states she is being careful and not leaving home much due to this virus and practicing good handwashing.  Pt reports she is "still in my moldy house and will have to meet with disability lawyer"  RN CM discussed plan of care and no further nursing needs identified,  RN CM sent in basket to [place] of RN case closure, mailed discipline case closure to pt home.  [facility] CM Care Plan Problem One     Most Recent Value  Care Plan Problem One  Difficulty coping with and having resolution of recent diagnosis pneumonia due to environmental factors  Role Documenting the Problem One  Care Management [place] for Problem One  Active  [facility] Long Term Goal   Pt will demonstrate improved understanding of self care for pneumonia and working with social work for resources/ solutions related to environmental factors in the home within 60 days  [place] Term Goal Start Date  11/12/18  [facility] Long Term Goal Met Date  01/12/19  Interventions for Problem One Long Term Goal  Pneumonia has resolved, pt is no longer on antibiotics, pt is working with [facility] LCSW and does have resources  [facility] CM Short Term Goal #1   Pt will show improvement of respiratory condition (pneumonia, chronic airway disease with asthma) within 30 days  [facility] CM Short Term Goal #1 Start Date  11/12/18  [facility] CM Short Term Goal #1 Met Date  12/15/18    [facility] CM Care Plan Problem Two     Most Recent Value  Care Plan Problem Two  Difficulty coping due to anxiety and depression  Role Documenting the Problem Two   Care Management Coordinator  Care Plan for Problem Two  Active  Interventions for Problem Two Long Term Goal   Pt seems to be coping better, has made some progress with getting to recent appointments (pulmonary and having shoulder assessed), pt is driving, pt did not seem upset or distraught over the phone, pt has resources lined up and is checking into options  [facility] Long Term Goal  Pt will demonstrate improved coping mechanisms related to anxiety and depression within 60 days  [facility] Long Term Goal Start Date  11/12/18  [facility] Long Term Goal Met Date  01/12/19      PLAN Close case for RN care manager CSW continues to assist pt  [name] [facility], BSN Industry Coordinator [phone]

## 2019-01-13 DIAGNOSIS — J189 Pneumonia, unspecified organism: Secondary | ICD-10-CM | POA: Diagnosis not present

## 2019-01-13 DIAGNOSIS — R918 Other nonspecific abnormal finding of lung field: Secondary | ICD-10-CM | POA: Diagnosis not present

## 2019-01-13 DIAGNOSIS — J449 Chronic obstructive pulmonary disease, unspecified: Secondary | ICD-10-CM | POA: Diagnosis not present

## 2019-01-13 DIAGNOSIS — R002 Palpitations: Secondary | ICD-10-CM | POA: Diagnosis not present

## 2019-01-21 ENCOUNTER — Ambulatory Visit: Payer: Self-pay | Admitting: Internal Medicine

## 2019-01-21 ENCOUNTER — Ambulatory Visit: Payer: Self-pay | Admitting: *Deleted

## 2019-01-21 DIAGNOSIS — R002 Palpitations: Secondary | ICD-10-CM | POA: Diagnosis not present

## 2019-01-21 DIAGNOSIS — R42 Dizziness and giddiness: Secondary | ICD-10-CM | POA: Diagnosis not present

## 2019-01-21 DIAGNOSIS — J449 Chronic obstructive pulmonary disease, unspecified: Secondary | ICD-10-CM | POA: Diagnosis not present

## 2019-01-21 DIAGNOSIS — J019 Acute sinusitis, unspecified: Secondary | ICD-10-CM | POA: Diagnosis not present

## 2019-01-21 NOTE — Telephone Encounter (Signed)
Pt called with complaints of dizziness for the past 2 days; she says this happens when she gets sinus congestion; the pt says that she has sinus drainage that is "going into her chest and making me cough" which started 3/242/2020';  the pt says that she does not think she has a fever; dizziness is her worst symptom; the pt also says that she has a lung disease but she does not know the name of it; the pt answers "no" to all questions on the [facility] Virus Evaluation; nurse triage initiated and recommendations made per protocol; the pt verbalizes understanding and she would like to do a phone visit with Dr Alain Marion today; she also says that when she has sinus problems Dr Alain Marion normally calls something in for her; pt offered and scheduled telephone visit with Dr Alain Marion, [facility], 01/21/2019 at 1100; she verbalizes understanding and can be contacted at [phone]; will route to office for notification; also notified [place]  Reason for Disposition . [1] MODERATE dizziness (e.g., interferes with normal activities) AND [2] has NOT been evaluated by physician for this  (Exception: dizziness caused by heat exposure, sudden standing, or poor fluid intake)  Answer Assessment - Initial Assessment Questions 1. DESCRIPTION: "Describe your dizziness."     "just dizzy" 2. LIGHTHEADED: "Do you feel lightheaded?" (e.g., somewhat faint, woozy, weak upon standing)    "just dizzy" 3. VERTIGO: "Do you feel like either you or the room is spinning or tilting?" (i.e. vertigo)    yes 4. SEVERITY: "How bad is it?"  "Do you feel like you are going to faint?" "Can you stand and walk?"   - MILD - walking normally   - MODERATE - interferes with normal activities (e.g., work, school)    - SEVERE - unable to stand, requires support to walk, feels like passing out now.      Moderate; pt says that she staggers when she wals 5. ONSET:  "When did the dizziness begin?"     01/19/2019 6. AGGRAVATING FACTORS: "Does anything  make it worse?" (e.g., standing, change in head position)     Changing position of her head and when she stands up 7. HEART RATE: "Can you tell me your heart rate?" "How many beats in 15 seconds?"  (Note: not all patients can do this)       Pt unable to complete this task; pt's has an irregular heartbeat 8. CAUSE: "What do you think is causing the dizziness?"     Sinus problem 9. RECURRENT SYMPTOM: "Have you had dizziness before?" If so, ask: "When was the last time?" "What happened that time?"    Yes when she has problems with her sinuses 10. OTHER SYMPTOMS: "Do you have any other symptoms?" (e.g., fever, chest pain, vomiting, diarrhea, bleeding)       Nausea, sinus drainage and congestion, non productive cough 11. PREGNANCY: "Is there any chance you are pregnant?" "When was your last menstrual period?"       No,hysterectomy  Protocols used: DIZZINESS [facility]

## 2019-01-21 NOTE — Telephone Encounter (Signed)
Spoke with [place] at [employer], and she states that the pt needs to be seen for a virtual visit, or Dr Alain Marion can approve her to come into the office for a visit; notified pt and she says that she has an iPhone and she is having difficulty with it, and she is afraid to come into the office because of her lung disease; will cancel previously scheduled appointment, and route to office for final disposition; notified [place].

## 2019-01-21 NOTE — Telephone Encounter (Signed)
Pt complains of dizziness, nausea, sinus drainage, chest congestion, and cough. Pt would like to know if a medication can be sent in to her pharmacy.  Please advise.

## 2019-01-22 ENCOUNTER — Other Ambulatory Visit (HOSPITAL_COMMUNITY): Payer: Self-pay | Admitting: Pulmonary Disease

## 2019-01-22 ENCOUNTER — Other Ambulatory Visit: Payer: Self-pay | Admitting: Pulmonary Disease

## 2019-01-22 DIAGNOSIS — R0602 Shortness of breath: Secondary | ICD-10-CM

## 2019-01-25 NOTE — Telephone Encounter (Signed)
Please use over-the-counter cold meds Delsym as needed Use Antivert for dizziness. Office visit or virtual visit if not better Thank you

## 2019-01-26 NOTE — Telephone Encounter (Signed)
Unable to reach pt

## 2019-01-27 NOTE — Telephone Encounter (Signed)
Reason for CRM: pt returning bettys call unable to edit nurse triage note. Pt stated her pulmonary dr prescribed abx for her.

## 2019-01-28 DIAGNOSIS — M25512 Pain in left shoulder: Secondary | ICD-10-CM | POA: Diagnosis not present

## 2019-02-12 ENCOUNTER — Telehealth: Payer: Self-pay | Admitting: Pulmonary Disease

## 2019-02-19 ENCOUNTER — Other Ambulatory Visit: Payer: Self-pay | Admitting: Internal Medicine

## 2019-02-22 ENCOUNTER — Encounter: Payer: Self-pay | Admitting: Orthopaedic Surgery

## 2019-02-23 ENCOUNTER — Ambulatory Visit (HOSPITAL_COMMUNITY): Payer: Medicare Other

## 2019-02-23 ENCOUNTER — Encounter (HOSPITAL_COMMUNITY): Payer: Self-pay

## 2019-02-27 ENCOUNTER — Ambulatory Visit: Payer: Medicare Other | Admitting: Obstetrics and Gynecology

## 2019-03-11 ENCOUNTER — Other Ambulatory Visit: Payer: Self-pay | Admitting: Internal Medicine

## 2019-03-11 ENCOUNTER — Other Ambulatory Visit: Payer: Self-pay | Admitting: Family Medicine

## 2019-03-11 NOTE — Telephone Encounter (Signed)
Attempted to call to set up virtual visit. Voicemail not set up.

## 2019-03-24 ENCOUNTER — Encounter: Payer: Self-pay | Admitting: Internal Medicine

## 2019-03-24 ENCOUNTER — Ambulatory Visit (INDEPENDENT_AMBULATORY_CARE_PROVIDER_SITE_OTHER): Payer: Medicare Other | Admitting: Internal Medicine

## 2019-03-24 DIAGNOSIS — M25512 Pain in left shoulder: Secondary | ICD-10-CM | POA: Diagnosis not present

## 2019-03-24 DIAGNOSIS — F411 Generalized anxiety disorder: Secondary | ICD-10-CM | POA: Diagnosis not present

## 2019-03-24 DIAGNOSIS — M5440 Lumbago with sciatica, unspecified side: Secondary | ICD-10-CM | POA: Diagnosis not present

## 2019-03-24 DIAGNOSIS — G8929 Other chronic pain: Secondary | ICD-10-CM | POA: Diagnosis not present

## 2019-03-24 DIAGNOSIS — J189 Pneumonia, unspecified organism: Secondary | ICD-10-CM | POA: Diagnosis not present

## 2019-03-24 MED ORDER — OXYCODONE-ACETAMINOPHEN 5-325 MG PO TABS: 1.0000 | ORAL_TABLET | Freq: Two times a day (BID) | ORAL | 0 refills | Status: DC | PRN

## 2019-03-24 NOTE — Assessment & Plan Note (Signed)
   better on Percocet   Potential benefits of a long term opioids use as well as potential risks (i.e. addiction risk, apnea etc) and complications (i.e. Somnolence, constipation and others) were explained to the patient and were aknowledged.  Potential benefits of a long term NSAID  use as well as potential risks  and complications were explained to the patient and were aknowledged.

## 2019-03-24 NOTE — Assessment & Plan Note (Signed)
Seroquel, Xanax prn

## 2019-03-24 NOTE — Assessment & Plan Note (Signed)
F/u ct chest - pending

## 2019-03-24 NOTE — Progress Notes (Signed)
Virtual Visit via Telephone Note  I connected with Melody [name] on 03/24/19 at  1:40 PM EDT by telephone and verified that I am speaking with the correct person using two identifiers.   I discussed the limitations, risks, security and privacy concerns of performing an evaluation and management service by telephone and the availability of in person appointments. I also discussed with the patient that there may be a patient responsible charge related to this service. The patient expressed understanding and agreed to proceed.   History of Present Illness: C/o L shoulder pain following a fall form bed in Jan 2020. haron needs to tart PT later. F/u abn CXR - another CT is pending. C/o LBP. C/o sinusitis sx's x 2 weeks   Observations/Objective:  NAD. Assessment and Plan:  See plan Follow Up Instructions:    I discussed the assessment and treatment plan with the patient. The patient was provided an opportunity to ask questions and all were answered. The patient agreed with the plan and demonstrated an understanding of the instructions.   The patient was advised to call back or seek an in-person evaluation if the symptoms worsen or if the condition fails to improve as anticipated.  I provided 22 minutes of non-face-to-face time during this encounter.   Walker Kehr, MD

## 2019-03-24 NOTE — Assessment & Plan Note (Signed)
PT is planned to start after COVID 19

## 2019-04-03 ENCOUNTER — Encounter (HOSPITAL_COMMUNITY): Payer: Self-pay

## 2019-04-03 ENCOUNTER — Ambulatory Visit (HOSPITAL_COMMUNITY)
Admission: RE | Admit: 2019-04-03 | Discharge: 2019-04-03 | Disposition: A | Discharge status: Home / Self Care | Payer: Medicare Other | Source: Ambulatory Visit | Attending: Pulmonary Disease | Admitting: Pulmonary Disease

## 2019-04-03 ENCOUNTER — Other Ambulatory Visit: Payer: Self-pay

## 2019-04-03 DIAGNOSIS — R0602 Shortness of breath: Secondary | ICD-10-CM

## 2019-04-03 DIAGNOSIS — J479 Bronchiectasis, uncomplicated: Secondary | ICD-10-CM | POA: Diagnosis not present

## 2019-04-03 DIAGNOSIS — J181 Lobar pneumonia, unspecified organism: Secondary | ICD-10-CM | POA: Diagnosis not present

## 2019-04-03 DIAGNOSIS — J984 Other disorders of lung: Secondary | ICD-10-CM | POA: Diagnosis not present

## 2019-04-03 LAB — POCT I-STAT CREATININE: Creatinine, Ser: 0.8 mg/dL (ref 0.44–1.00)

## 2019-04-03 MED ORDER — IOHEXOL 300 MG/ML  SOLN
75.0000 mL | Freq: Once | INTRAMUSCULAR | Status: AC | PRN
  Administered 2019-04-03: 16:00:00 75 mL via INTRAVENOUS

## 2019-04-09 DIAGNOSIS — R918 Other nonspecific abnormal finding of lung field: Secondary | ICD-10-CM | POA: Diagnosis not present

## 2019-04-09 DIAGNOSIS — J449 Chronic obstructive pulmonary disease, unspecified: Secondary | ICD-10-CM | POA: Diagnosis not present

## 2019-04-12 ENCOUNTER — Other Ambulatory Visit: Payer: Self-pay | Admitting: Pulmonary Disease

## 2019-04-17 ENCOUNTER — Telehealth: Payer: Self-pay

## 2019-04-17 DIAGNOSIS — M25512 Pain in left shoulder: Secondary | ICD-10-CM

## 2019-04-17 DIAGNOSIS — G8929 Other chronic pain: Secondary | ICD-10-CM

## 2019-04-17 NOTE — Telephone Encounter (Signed)
Does she want me to refer her to [facility]?  Thanks

## 2019-04-17 NOTE — Telephone Encounter (Signed)
Copied from [place] [id number]. Topic: General - Other >> Apr 16, 2019 11:07 AM Jodie Echevaria wrote: Reason for CRM: Patient called to say that she have seen a Dr Jacquiline Doe at [facility] orthopedic but due to them not being consistent she will not go back to them. She states that she is having lots of pain in her left shoulder, left side of head from fall back in march. Also patient is requesting a call back from Dr Alain Marion or his assistant to discuss her next step if she can not get an appointment with [facility]. Patient can be reached at Ph# [phone]

## 2019-04-20 NOTE — Telephone Encounter (Signed)
Yes.  That is where we originally placed referral to but she called [facility] before we could complete referral process.

## 2019-04-21 ENCOUNTER — Other Ambulatory Visit: Payer: Self-pay | Admitting: Internal Medicine

## 2019-04-21 DIAGNOSIS — M25512 Pain in left shoulder: Secondary | ICD-10-CM | POA: Diagnosis not present

## 2019-04-21 DIAGNOSIS — M7542 Impingement syndrome of left shoulder: Secondary | ICD-10-CM | POA: Diagnosis not present

## 2019-05-04 ENCOUNTER — Ambulatory Visit (INDEPENDENT_AMBULATORY_CARE_PROVIDER_SITE_OTHER): Payer: Medicare Other

## 2019-05-04 DIAGNOSIS — R002 Palpitations: Secondary | ICD-10-CM

## 2019-05-04 NOTE — Telephone Encounter (Signed)
Ok Thx 

## 2019-05-04 NOTE — Addendum Note (Signed)
Addended by: Cassandria Anger on: 05/04/2019 10:05 PM   Modules accepted: Orders

## 2019-05-05 ENCOUNTER — Other Ambulatory Visit: Payer: Self-pay

## 2019-05-05 DIAGNOSIS — R002 Palpitations: Secondary | ICD-10-CM

## 2019-05-19 ENCOUNTER — Other Ambulatory Visit: Payer: Self-pay | Admitting: Internal Medicine

## 2019-05-19 NOTE — Telephone Encounter (Signed)
[place] Controlled Database Checked Last filled: Oxy/APAP  04/22/19 # 90 LOV w/you: 03/24/19 Next appt w/you: 06/24/19

## 2019-05-26 DIAGNOSIS — J301 Allergic rhinitis due to pollen: Secondary | ICD-10-CM | POA: Diagnosis not present

## 2019-05-26 DIAGNOSIS — R9389 Abnormal findings on diagnostic imaging of other specified body structures: Secondary | ICD-10-CM | POA: Diagnosis not present

## 2019-05-26 DIAGNOSIS — R002 Palpitations: Secondary | ICD-10-CM | POA: Diagnosis not present

## 2019-05-26 DIAGNOSIS — J449 Chronic obstructive pulmonary disease, unspecified: Secondary | ICD-10-CM | POA: Diagnosis not present

## 2019-06-01 ENCOUNTER — Telehealth: Payer: Self-pay | Admitting: Internal Medicine

## 2019-06-01 NOTE — Telephone Encounter (Signed)
Called patient to schedule AWV; no answer and no vm set up. Will try to call back at later time. SF

## 2019-06-03 DIAGNOSIS — M25512 Pain in left shoulder: Secondary | ICD-10-CM | POA: Diagnosis not present

## 2019-06-08 ENCOUNTER — Other Ambulatory Visit: Payer: Self-pay | Admitting: Internal Medicine

## 2019-06-17 ENCOUNTER — Other Ambulatory Visit: Payer: Self-pay | Admitting: Internal Medicine

## 2019-06-17 NOTE — Telephone Encounter (Signed)
Control database checked last refill: 05/20/2019 90 tabs LOV: 03/24/2019 NOV: 06/24/2019

## 2019-06-18 ENCOUNTER — Other Ambulatory Visit: Payer: Self-pay | Admitting: Internal Medicine

## 2019-06-24 ENCOUNTER — Other Ambulatory Visit (INDEPENDENT_AMBULATORY_CARE_PROVIDER_SITE_OTHER): Payer: Medicare Other

## 2019-06-24 ENCOUNTER — Ambulatory Visit (INDEPENDENT_AMBULATORY_CARE_PROVIDER_SITE_OTHER): Payer: Medicare Other | Admitting: Internal Medicine

## 2019-06-24 ENCOUNTER — Encounter: Payer: Self-pay | Admitting: Internal Medicine

## 2019-06-24 ENCOUNTER — Other Ambulatory Visit: Payer: Self-pay

## 2019-06-24 VITALS — BP 130/90 | HR 74 | Temp 97.8°F | Ht 66.0 in | Wt 135.0 lb

## 2019-06-24 DIAGNOSIS — F411 Generalized anxiety disorder: Secondary | ICD-10-CM

## 2019-06-24 DIAGNOSIS — Z23 Encounter for immunization: Secondary | ICD-10-CM | POA: Diagnosis not present

## 2019-06-24 DIAGNOSIS — E559 Vitamin D deficiency, unspecified: Secondary | ICD-10-CM

## 2019-06-24 DIAGNOSIS — G8929 Other chronic pain: Secondary | ICD-10-CM | POA: Diagnosis not present

## 2019-06-24 DIAGNOSIS — J449 Chronic obstructive pulmonary disease, unspecified: Secondary | ICD-10-CM

## 2019-06-24 DIAGNOSIS — A310 Pulmonary mycobacterial infection: Secondary | ICD-10-CM

## 2019-06-24 DIAGNOSIS — M5440 Lumbago with sciatica, unspecified side: Secondary | ICD-10-CM

## 2019-06-24 DIAGNOSIS — K219 Gastro-esophageal reflux disease without esophagitis: Secondary | ICD-10-CM

## 2019-06-24 DIAGNOSIS — E538 Deficiency of other specified B group vitamins: Secondary | ICD-10-CM

## 2019-06-24 DIAGNOSIS — M7542 Impingement syndrome of left shoulder: Secondary | ICD-10-CM | POA: Diagnosis not present

## 2019-06-24 LAB — HEPATIC FUNCTION PANEL
ALT: 9 U/L (ref 0–35)
AST: 13 U/L (ref 0–37)
Albumin: 4.3 g/dL (ref 3.5–5.2)
Alkaline Phosphatase: 60 U/L (ref 39–117)
Bilirubin, Direct: 0.1 mg/dL (ref 0.0–0.3)
Total Bilirubin: 0.9 mg/dL (ref 0.2–1.2)
Total Protein: 6.7 g/dL (ref 6.0–8.3)

## 2019-06-24 LAB — CBC WITH DIFFERENTIAL/PLATELET
Basophils Absolute: 0 10*3/uL (ref 0.0–0.1)
Basophils Relative: 0.7 % (ref 0.0–3.0)
Eosinophils Absolute: 0.1 10*3/uL (ref 0.0–0.7)
Eosinophils Relative: 1.6 % (ref 0.0–5.0)
HCT: 41.5 % (ref 36.0–46.0)
Hemoglobin: 13.9 g/dL (ref 12.0–15.0)
Lymphocytes Relative: 18.5 % (ref 12.0–46.0)
Lymphs Abs: 1.2 10*3/uL (ref 0.7–4.0)
MCHC: 33.6 g/dL (ref 30.0–36.0)
MCV: 87.6 fl (ref 78.0–100.0)
Monocytes Absolute: 0.3 10*3/uL (ref 0.1–1.0)
Monocytes Relative: 5.2 % (ref 3.0–12.0)
Neutro Abs: 4.7 10*3/uL (ref 1.4–7.7)
Neutrophils Relative %: 74 % (ref 43.0–77.0)
Platelets: 163 10*3/uL (ref 150.0–400.0)
RBC: 4.74 Mil/uL (ref 3.87–5.11)
RDW: 13.2 % (ref 11.5–15.5)
WBC: 6.4 10*3/uL (ref 4.0–10.5)

## 2019-06-24 LAB — BASIC METABOLIC PANEL
BUN: 13 mg/dL (ref 6–23)
CO2: 29 mEq/L (ref 19–32)
Calcium: 9.5 mg/dL (ref 8.4–10.5)
Chloride: 105 mEq/L (ref 96–112)
Creatinine, Ser: 0.78 mg/dL (ref 0.40–1.20)
GFR: 74.22 mL/min (ref >60.00)
Glucose, Bld: 105 mg/dL — ABNORMAL HIGH (ref 70–99)
Potassium: 3.7 mEq/L (ref 3.5–5.1)
Sodium: 143 mEq/L (ref 135–145)

## 2019-06-24 LAB — VITAMIN D 25 HYDROXY (VIT D DEFICIENCY, FRACTURES): VITD: 46.59 ng/mL (ref 30.00–100.00)

## 2019-06-24 LAB — TSH: TSH: 1.99 u[IU]/mL (ref 0.35–4.50)

## 2019-06-24 MED ORDER — VITAMIN D3 1.25 MG (50000 UT) PO CAPS: 1.0000 | ORAL_CAPSULE | ORAL | 1 refills | Status: DC

## 2019-06-24 MED ORDER — SUMATRIPTAN SUCCINATE 100 MG PO TABS: ORAL_TABLET | ORAL | 3 refills | Status: DC

## 2019-06-24 MED ORDER — LEVOCETIRIZINE DIHYDROCHLORIDE 5 MG PO TABS: 5.0000 mg | ORAL_TABLET | Freq: Every evening | ORAL | 3 refills | Status: DC

## 2019-06-24 MED ORDER — CYANOCOBALAMIN 1000 MCG/ML IJ SOLN: INTRAMUSCULAR | 3 refills | Status: DC

## 2019-06-24 MED ORDER — PANTOPRAZOLE SODIUM 40 MG PO TBEC: 40.0000 mg | DELAYED_RELEASE_TABLET | Freq: Every day | ORAL | 3 refills | Status: DC

## 2019-06-24 MED ORDER — BREO ELLIPTA 100-25 MCG/INH IN AEPB: 1.0000 | INHALATION_SPRAY | Freq: Every day | RESPIRATORY_TRACT | 3 refills | Status: DC

## 2019-06-24 MED ORDER — TRIAMCINOLONE ACETONIDE 0.1 % EX OINT: 1.0000 "application " | TOPICAL_OINTMENT | Freq: Two times a day (BID) | CUTANEOUS | 3 refills | Status: DC

## 2019-06-24 MED ORDER — FLUTICASONE PROPIONATE 50 MCG/ACT NA SUSP: 2.0000 | Freq: Every day | NASAL | 3 refills | Status: DC

## 2019-06-24 MED ORDER — OXYCODONE-ACETAMINOPHEN 5-325 MG PO TABS: ORAL_TABLET | ORAL | 0 refills | Status: DC

## 2019-06-24 MED ORDER — LEVOTHYROXINE SODIUM 50 MCG PO TABS: 50.0000 ug | ORAL_TABLET | Freq: Every day | ORAL | 3 refills | Status: DC

## 2019-06-24 NOTE — Assessment & Plan Note (Signed)
F/u w/Pulmonary

## 2019-06-24 NOTE — Assessment & Plan Note (Addendum)
Proair prn Breo qd  Pt refused a flu shot, then agreed

## 2019-06-24 NOTE — Assessment & Plan Note (Signed)
on Percocet   Potential benefits of a long term opioids use as well as potential risks (i.e. addiction risk, apnea etc) and complications (i.e. Somnolence, constipation and others) were explained to the patient and were aknowledged.  Potential benefits of a long term NSAID  use as well as potential risks  and complications were explained to the patient and were aknowledged.

## 2019-06-24 NOTE — Patient Instructions (Signed)
If you have medicare related insurance (such as traditional Medicare, Blue Cross Medicare, United HealthCare Medicare, or similar), Please make an appointment at the scheduling desk with Jill, the Wellness Health Coach, for your Wellness visit in this office, which is a benefit with your insurance.  

## 2019-06-24 NOTE — Assessment & Plan Note (Signed)
Dr [name] Seroquel, Xanax prn

## 2019-06-24 NOTE — Assessment & Plan Note (Signed)
Protonix.  ?

## 2019-06-24 NOTE — Progress Notes (Signed)
Subjective:  Patient ID: Melody [name], female    DOB: 01/07/1956  Age: 63 y.o. MRN: [id number]  CC: No chief complaint on file.   HPI Melody [name] presents for chronic pain, bipolar depression, COPD f/u C/o stress C/o L shoulder pain - pt had an MRI Pt refused a flu shot, then agreed  Outpatient Medications Prior to Visit  Medication Sig Dispense Refill  . albuterol (VENTOLIN HFA) 108 (90 Base) MCG/ACT inhaler INHALE TWO PUFFS INTO LUNGS EVERY 6 HOURS AS NEEDED FOR WHEEZING OR SHORTNESS OF BREATH 8.5 g 1  . ALPRAZolam (XANAX) 1 MG tablet Take 1 mg by mouth 4 (four) times daily.     . Azelastine-Fluticasone (DYMISTA) 137-50 MCG/ACT SUSP Place 1 spray 2 (two) times daily into the nose. 1 Bottle 3  . BREO ELLIPTA 100-25 MCG/INH AEPB INHALE ONE PUFF INTO THE LUNGS DAILY 1 each 11  . cyanocobalamin (,VITAMIN B-12,) 1000 MCG/ML injection INJECT 1 ML INTO THE MUSCLE EVERY 14 DAYS. 2 mL 11  . fluticasone (FLONASE) 50 MCG/ACT nasal spray USE 2 SPRAYS IN BOTH NOSTRILS DAILY. 16 g 11  . ketorolac (TORADOL) 10 MG tablet Take 10 mg by mouth every 6 (six) hours as needed. for pain  0  . levocetirizine (XYZAL) 5 MG tablet TAKE 1 TABLET BY MOUTH EVERY EVENING. 30 tablet 11  . levothyroxine (SYNTHROID) 50 MCG tablet Take 1 tablet (50 mcg total) by mouth daily before breakfast. 90 tablet 3  . methylphenidate (RITALIN) 20 MG tablet Take 1 tablet (20 mg total) by mouth daily.  0  . oxyCODONE-acetaminophen (PERCOCET/ROXICET) 5-325 MG tablet TAKE 1 TO 1 & 1/2 TABLETS BY MOUTH TWICE DAILY AS NEEDED FOR SEVERE PAIN 90 tablet 0  . pantoprazole (PROTONIX) 40 MG tablet TAKE 1 TABLET BY MOUTH DAILY 30 tablet 5  . predniSONE (DELTASONE) 20 MG tablet Take 3 PO QAM x3days, 2 PO QAM x3days, 1 PO QAM x3days 18 tablet 0  . QUEtiapine (SEROQUEL) 25 MG tablet Take 25 mg by mouth at bedtime.     Marland Kitchen Respiratory Therapy Supplies (FLUTTER) DEVI Use as directed 1 each 0  . SUMAtriptan (IMITREX) 100 MG tablet TAKE 1 TABLET  BY MOUTH EVERY 2 HOURS AS NEEDED FOR MIGRAINE - MAY REPEAT in TWO HOURS if HEADACHE persists OR recurs 10 tablet 3  . triamcinolone ointment (KENALOG) 0.1 % Apply 1 application topically 2 (two) times daily. (Patient taking differently: Apply 1 application topically 2 (two) times daily as needed. ) 80 g 3  . Vitamin D, Ergocalciferol, (DRISDOL) 1.25 MG (50000 UT) CAPS capsule TAKE 1 CAPSULE BY MOUTH EVERY SEVEN DAYS 12 capsule 0   No facility-administered medications prior to visit.     ROS: Review of Systems  Constitutional: Positive for fatigue. Negative for activity change, appetite change, chills, fever and unexpected weight change.  HENT: Negative for congestion, mouth sores and sinus pressure.   Eyes: Negative for visual disturbance.  Respiratory: Positive for shortness of breath. Negative for cough and chest tightness.   Gastrointestinal: Negative for abdominal pain and nausea.  Genitourinary: Negative for difficulty urinating, frequency and vaginal pain.  Musculoskeletal: Positive for arthralgias and back pain. Negative for gait problem.  Skin: Negative for pallor and rash.  Neurological: Negative for dizziness, tremors, weakness, numbness and headaches.  Psychiatric/Behavioral: Positive for decreased concentration and dysphoric mood. Negative for confusion, sleep disturbance and suicidal ideas. The patient is nervous/anxious.     Objective:  Ht 5\' 6"  (1.676 m)  Wt 135 lb (61.2 kg)   LMP 10/29/2006 (Approximate)   BMI 21.79 kg/m   BP Readings from Last 3 Encounters:  12/23/18 126/80  12/10/18 140/76  11/17/18 (!) 156/92    Wt Readings from Last 3 Encounters:  06/24/19 135 lb (61.2 kg)  12/23/18 142 lb (64.4 kg)  12/10/18 149 lb (67.6 kg)    Physical Exam Constitutional:      General: She is not in acute distress.    Appearance: She is well-developed.  HENT:     Head: Normocephalic.     Right Ear: External ear normal.     Left Ear: External ear normal.      Nose: Nose normal.  Eyes:     General:        Right eye: No discharge.        Left eye: No discharge.     Conjunctiva/sclera: Conjunctivae normal.     Pupils: Pupils are equal, round, and reactive to Radke.  Neck:     Musculoskeletal: Normal range of motion and neck supple.     Thyroid: No thyromegaly.     Vascular: No JVD.     Trachea: No tracheal deviation.  Cardiovascular:     Rate and Rhythm: Normal rate and regular rhythm.     Heart sounds: Normal heart sounds.  Pulmonary:     Effort: No respiratory distress.     Breath sounds: No stridor. No wheezing or rhonchi.  Abdominal:     General: Bowel sounds are normal. There is no distension.     Palpations: Abdomen is soft. There is no mass.     Tenderness: There is no abdominal tenderness. There is no guarding or rebound.  Musculoskeletal:        General: Tenderness present.  Lymphadenopathy:     Cervical: No cervical adenopathy.  Skin:    Findings: No erythema or rash.  Neurological:     Cranial Nerves: No cranial nerve deficit.     Motor: No abnormal muscle tone.     Coordination: Coordination normal.     Deep Tendon Reflexes: Reflexes normal.  Psychiatric:        Behavior: Behavior normal.        Thought Content: Thought content normal.        Judgment: Judgment normal.   LS, L shoulder tender  Lab Results  Component Value Date   WBC 9.3 11/08/2018   HGB 11.2 (L) 11/08/2018   HCT 34.8 (L) 11/08/2018   PLT 133 (L) 11/08/2018   GLUCOSE 149 (H) 11/09/2018   CHOL 180 05/16/2018   TRIG 67.0 05/16/2018   HDL 69.80 05/16/2018   LDLCALC 96 05/16/2018   ALT 9 11/08/2018   AST 13 (L) 11/08/2018   NA 140 11/09/2018   K 3.8 11/09/2018   CL 114 (H) 11/09/2018   CREATININE 0.80 04/03/2019   BUN 11 11/09/2018   CO2 22 11/09/2018   TSH 3.055 10/02/2018    Ct Chest W Contrast  Result Date: 04/03/2019 CLINICAL DATA:  Recurrent pneumonia EXAM: CT CHEST WITH CONTRAST TECHNIQUE: Multidetector CT imaging of the chest was  performed during intravenous contrast administration. CONTRAST:  63mL OMNIPAQUE IOHEXOL 300 MG/ML  SOLN COMPARISON:  Chest radiograph, 11/07/2018, CT chest, 02/26/2017 FINDINGS: Cardiovascular: No significant vascular findings. Normal heart size. No pericardial effusion. Mediastinum/Nodes: No enlarged mediastinal, hilar, or axillary lymph nodes. Thyroid gland, trachea, and esophagus demonstrate no significant findings. Lungs/Pleura: There is extensive bilateral, right greater than left clustered tree-in-bud and centrilobular nodularity  with bandlike scarring, consolidation, and bronchiectasis of the right middle lobe and lingula. These findings are generally improved compared to prior CT dated 02/26/2017, although there are areas of new and fluctuant nodularity, for example in the right lung base (series 4, image 122). No pleural effusion or pneumothorax. Upper Abdomen: No acute abnormality. Musculoskeletal: No chest wall mass or suspicious bone lesions identified. IMPRESSION: There is extensive bilateral, right greater than left clustered tree-in-bud and centrilobular nodularity with bandlike scarring, consolidation, and bronchiectasis of the right middle lobe and lingula. These findings are generally improved compared to prior CT dated 02/26/2017, although there are areas of new and fluctuant nodularity, for example in the right lung base (series 4, image 122). 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 Electronically Signed   By: Eddie Candle M.D.   On: 04/03/2019 20:58    Assessment & Plan:   There are no diagnoses linked to this encounter.   No orders of the defined types were placed in this encounter.    Follow-up: No follow-ups on file.  Walker Kehr, MD

## 2019-06-24 NOTE — Assessment & Plan Note (Signed)
On B12 

## 2019-06-30 ENCOUNTER — Telehealth: Payer: Self-pay | Admitting: *Deleted

## 2019-06-30 NOTE — Telephone Encounter (Signed)
It could have been a ruptured vessel or a cyst Thx

## 2019-06-30 NOTE — Telephone Encounter (Signed)
She wants to know why her right palm "turned black" and was painful last month and lasted x 1 week. She forgot to mention this at her recent OV. There was no injury to the area. She wants to know what you think could cause this. Please advise.

## 2019-06-30 NOTE — Telephone Encounter (Signed)
Tried contacting pt, unable to LVM 

## 2019-07-01 NOTE — Telephone Encounter (Signed)
Best contact: [phone] Seeking relief for possible yeast infection. Please advise

## 2019-07-03 MED ORDER — FLUCONAZOLE 150 MG PO TABS: 150.0000 mg | ORAL_TABLET | Freq: Once | ORAL | 1 refills | Status: DC

## 2019-07-03 NOTE — Telephone Encounter (Signed)
Pt informed of below.  

## 2019-07-03 NOTE — Telephone Encounter (Signed)
Diflucan Rx emailed Thx

## 2019-07-10 DIAGNOSIS — M25512 Pain in left shoulder: Secondary | ICD-10-CM | POA: Diagnosis not present

## 2019-07-10 DIAGNOSIS — M7542 Impingement syndrome of left shoulder: Secondary | ICD-10-CM | POA: Diagnosis not present

## 2019-07-10 DIAGNOSIS — M7502 Adhesive capsulitis of left shoulder: Secondary | ICD-10-CM | POA: Diagnosis not present

## 2019-07-15 DIAGNOSIS — M7502 Adhesive capsulitis of left shoulder: Secondary | ICD-10-CM | POA: Diagnosis not present

## 2019-07-15 DIAGNOSIS — M6281 Muscle weakness (generalized): Secondary | ICD-10-CM | POA: Diagnosis not present

## 2019-07-15 DIAGNOSIS — M7542 Impingement syndrome of left shoulder: Secondary | ICD-10-CM | POA: Diagnosis not present

## 2019-07-15 DIAGNOSIS — M25612 Stiffness of left shoulder, not elsewhere classified: Secondary | ICD-10-CM | POA: Diagnosis not present

## 2019-07-17 DIAGNOSIS — M7542 Impingement syndrome of left shoulder: Secondary | ICD-10-CM | POA: Diagnosis not present

## 2019-07-17 DIAGNOSIS — M7502 Adhesive capsulitis of left shoulder: Secondary | ICD-10-CM | POA: Diagnosis not present

## 2019-07-17 DIAGNOSIS — M6281 Muscle weakness (generalized): Secondary | ICD-10-CM | POA: Diagnosis not present

## 2019-07-17 DIAGNOSIS — M25612 Stiffness of left shoulder, not elsewhere classified: Secondary | ICD-10-CM | POA: Diagnosis not present

## 2019-07-27 DIAGNOSIS — M7502 Adhesive capsulitis of left shoulder: Secondary | ICD-10-CM | POA: Diagnosis not present

## 2019-07-27 DIAGNOSIS — M25612 Stiffness of left shoulder, not elsewhere classified: Secondary | ICD-10-CM | POA: Diagnosis not present

## 2019-07-27 DIAGNOSIS — M7542 Impingement syndrome of left shoulder: Secondary | ICD-10-CM | POA: Diagnosis not present

## 2019-07-27 DIAGNOSIS — M6281 Muscle weakness (generalized): Secondary | ICD-10-CM | POA: Diagnosis not present

## 2019-07-31 DIAGNOSIS — M6281 Muscle weakness (generalized): Secondary | ICD-10-CM | POA: Diagnosis not present

## 2019-07-31 DIAGNOSIS — M25612 Stiffness of left shoulder, not elsewhere classified: Secondary | ICD-10-CM | POA: Diagnosis not present

## 2019-07-31 DIAGNOSIS — M7502 Adhesive capsulitis of left shoulder: Secondary | ICD-10-CM | POA: Diagnosis not present

## 2019-07-31 DIAGNOSIS — M7542 Impingement syndrome of left shoulder: Secondary | ICD-10-CM | POA: Diagnosis not present

## 2019-08-03 DIAGNOSIS — M7502 Adhesive capsulitis of left shoulder: Secondary | ICD-10-CM | POA: Diagnosis not present

## 2019-08-03 DIAGNOSIS — M7542 Impingement syndrome of left shoulder: Secondary | ICD-10-CM | POA: Diagnosis not present

## 2019-08-03 DIAGNOSIS — M25612 Stiffness of left shoulder, not elsewhere classified: Secondary | ICD-10-CM | POA: Diagnosis not present

## 2019-08-03 DIAGNOSIS — M6281 Muscle weakness (generalized): Secondary | ICD-10-CM | POA: Diagnosis not present

## 2019-08-05 ENCOUNTER — Other Ambulatory Visit: Payer: Self-pay | Admitting: Internal Medicine

## 2019-08-07 DIAGNOSIS — M6281 Muscle weakness (generalized): Secondary | ICD-10-CM | POA: Diagnosis not present

## 2019-08-07 DIAGNOSIS — M25612 Stiffness of left shoulder, not elsewhere classified: Secondary | ICD-10-CM | POA: Diagnosis not present

## 2019-08-07 DIAGNOSIS — M7542 Impingement syndrome of left shoulder: Secondary | ICD-10-CM | POA: Diagnosis not present

## 2019-08-07 DIAGNOSIS — M7502 Adhesive capsulitis of left shoulder: Secondary | ICD-10-CM | POA: Diagnosis not present

## 2019-08-10 ENCOUNTER — Telehealth: Payer: Self-pay | Admitting: *Deleted

## 2019-08-10 NOTE — Telephone Encounter (Signed)
Copied from [phone]. Topic: General - Other >> Aug 10, 2019 12:50 PM Melody [name] wrote: Pt said Dr Onnie Graham at [facility] wanted her to ask if it will be ok for pt to take DICLOFENAC SODIUM 75mg  in pill form, want to make sure it will not interfere with her other medication

## 2019-08-11 NOTE — Telephone Encounter (Signed)
Pt called in , gave her Plot response.  She stated she would Call Dr supple back and try this med

## 2019-08-11 NOTE — Telephone Encounter (Signed)
Yes, she can try it. She reported HAs w/Nabumetone in the past, otherwise no issues w/NSAIDs in the past. Thx

## 2019-08-12 ENCOUNTER — Other Ambulatory Visit: Payer: Self-pay | Admitting: Internal Medicine

## 2019-08-13 ENCOUNTER — Other Ambulatory Visit: Payer: Self-pay | Admitting: Internal Medicine

## 2019-08-20 DIAGNOSIS — M7502 Adhesive capsulitis of left shoulder: Secondary | ICD-10-CM | POA: Insufficient documentation

## 2019-08-21 DIAGNOSIS — M7542 Impingement syndrome of left shoulder: Secondary | ICD-10-CM | POA: Diagnosis not present

## 2019-08-21 DIAGNOSIS — M7502 Adhesive capsulitis of left shoulder: Secondary | ICD-10-CM | POA: Diagnosis not present

## 2019-08-21 DIAGNOSIS — M25512 Pain in left shoulder: Secondary | ICD-10-CM | POA: Diagnosis not present

## 2019-08-26 DIAGNOSIS — J449 Chronic obstructive pulmonary disease, unspecified: Secondary | ICD-10-CM | POA: Diagnosis not present

## 2019-08-26 DIAGNOSIS — R002 Palpitations: Secondary | ICD-10-CM | POA: Diagnosis not present

## 2019-08-26 DIAGNOSIS — R9389 Abnormal findings on diagnostic imaging of other specified body structures: Secondary | ICD-10-CM | POA: Diagnosis not present

## 2019-08-28 ENCOUNTER — Ambulatory Visit (INDEPENDENT_AMBULATORY_CARE_PROVIDER_SITE_OTHER): Payer: Medicare Other | Admitting: Internal Medicine

## 2019-08-28 ENCOUNTER — Ambulatory Visit: Payer: Self-pay | Admitting: *Deleted

## 2019-08-28 DIAGNOSIS — B002 Herpesviral gingivostomatitis and pharyngotonsillitis: Secondary | ICD-10-CM

## 2019-08-28 DIAGNOSIS — F411 Generalized anxiety disorder: Secondary | ICD-10-CM | POA: Diagnosis not present

## 2019-08-28 DIAGNOSIS — J449 Chronic obstructive pulmonary disease, unspecified: Secondary | ICD-10-CM | POA: Diagnosis not present

## 2019-08-28 MED ORDER — VALACYCLOVIR HCL 1 G PO TABS: 1000.0000 mg | ORAL_TABLET | Freq: Three times a day (TID) | ORAL | 0 refills | Status: DC

## 2019-08-28 NOTE — Patient Instructions (Signed)
Please take all new medication as prescribed - the valtrex for the cold sores to the left upper lip  Please continue all other medications as before, and refills have been done if requested.  Please have the pharmacy call with any other refills you may need.  Please keep your appointments with your specialists as you may have planned

## 2019-08-28 NOTE — Telephone Encounter (Signed)
Pt called in c/o her lip on the upper left side is swollen almost twice its normal size.   When she woke up she had 2 blisters there.    She rubbed them and now it's a "big red area that hurts".     I live in [place] so I can't drive all the way in there.   Dr. Alain Marion usually just calls in whatever I need.   I asked if she were open to doing a virtual visit and she was agreeable to that.  See triage notes.  I warm transferred her call into the office to [facility] for scheduling. Reason for Disposition . [1] Looks infected AND [2] large red area (> 2 in. or 5 cm)  Answer Assessment - Initial Assessment Questions 1. ONSET: "When did the swelling start?" (e.g., minutes, hours, days)     My top lip on left side is swollen.   It has 2 blisters on it that is now a big red spot.    Don't know what this is.    2. SEVERITY: "How swollen is it?"     It's red almost twice a big as my lip normally 3. ITCHING: "Is there any itching?" If so, ask: "How much?"   (Scale 1-10; mild, moderate or severe)     No itching 4. PAIN: "Is the swelling painful to touch?" If so, ask: "How painful is it?"   (Scale 1-10; mild, moderate or severe)     It hurts and red 5. CAUSE: "What do you think is causing the lip swelling?"     I have no idea. 6. RECURRENT SYMPTOM: "Have you had lip swelling before?" If so, ask: "When was the last time?" "What happened that time?"     No 7. OTHER SYMPTOMS: "Do you have any other symptoms?" (e.g., toothache)     No swelling inside mouth of tongue or throat. 8. PREGNANCY: "Is there any chance you are pregnant?" "When was your last menstrual period?"     N/A due to age  Protocols used: LIP [facility]

## 2019-08-28 NOTE — Progress Notes (Signed)
Patient ID: Melody [name], female   DOB: [birth year], 63 y.o.   MRN: [id number]  Virtual Visit via Video Note  I connected with [name] on 08/28/19 at  1:00 PM EDT by a video enabled telemedicine application and verified that I am speaking with the correct person using two identifiers.  Location: Patient: at home Provider: at office   I discussed the limitations of evaluation and management by telemedicine and the availability of in person appointments. The patient expressed understanding and agreed to proceed.  History of Present Illness: Here with 2 days acute onset painful group of sores to the left upper lateral lip, quite painful to touch, sometimes to even talk, without fever or trauma.  No ear, HA, sinus or teeth or mouth pain or congestion.  Pt denies chest pain, increased sob or doe, wheezing, orthopnea, PND, increased LE swelling, palpitations, dizziness or syncope.   Pt denies polydipsia, polyuria  Denies worsening depressive symptoms, suicidal ideation, or panic  No other new complaints Past Medical History:  Diagnosis Date  . Anxiety   . Bronchiectasis ([place])   . Chronic back pain   . Chronic neck pain   . Depression    bipolar- Dr [name]  . Fatigue   . Fibromyalgia   . GERD (gastroesophageal reflux disease)   . Headache   . Hypothyroidism   . Low back pain    Dr Arnoldo Morale  . 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([place])   . Osteoarthritis   . Pneumonia   . Pulmonary nodule   . Sinus congestion   . Thyroid disease    hypothyroidsm  . Vertigo   . Vitamin B12 deficiency    Past Surgical History:  Procedure Laterality Date  . ABDOMINAL HYSTERECTOMY N/A 01/10/2016   Procedure: HYSTERECTOMY ABDOMINAL;  Surgeon: Nunzio Cobbs, MD;  Location: [facility];  Service: Gynecology;  Laterality: N/A;  . ABDOMINAL SACROCOLPOPEXY N/A 01/10/2016   Procedure: ABDOMINO SACROCOLPOPEXY ;  Surgeon: Nunzio Cobbs, MD;  Location: [facility];  Service: Gynecology;   Laterality: N/A;  . ANTERIOR AND POSTERIOR REPAIR N/A 01/10/2016   Procedure:  POSTERIOR REPAIR (RECTOCELE);  Surgeon: Nunzio Cobbs, MD;  Location: [facility];  Service: Gynecology;  Laterality: N/A;  . BLADDER SUSPENSION N/A 01/10/2016   Procedure: TRANSVAGINAL TAPE (TVT) PROCEDURE exact midurethral sling;  Surgeon: Nunzio Cobbs, MD;  Location: [facility];  Service: Gynecology;  Laterality: N/A;  . CERVICAL LAMINECTOMY  2001 and 2995   Dr Arnoldo Morale  . CYSTO N/A 01/10/2016   Procedure: CYSTOSCOPY;  Surgeon: Nunzio Cobbs, MD;  Location: [facility];  Service: Gynecology;  Laterality: N/A;  . SALPINGOOPHORECTOMY Bilateral 01/10/2016   Procedure: BILATERAL SALPINGO OOPHORECTOMY;  Surgeon: Nunzio Cobbs, MD;  Location: [facility];  Service: Gynecology;  Laterality: Bilateral;  . TUBAL LIGATION  1980    reports that she quit smoking about 8 years ago. Her smoking use included cigarettes. She started smoking about 56 years ago. She has a 35.00 pack-year smoking history. She has never used smokeless tobacco. She reports that she does not drink alcohol or use drugs. family history includes Arthritis in an other family member; Breast cancer in her cousin and paternal aunt; Cancer in her mother; Heart attack in her father; Hypertension in her father; Lung disease in her father. Allergies  Allergen Reactions  . Flagyl [Metronidazole] Shortness Of Breath    Made tongue turn dark red, was not  able to breath.  . Doxycycline Hyclate Itching  . Gabapentin     Headaches: Patient is not aware of the reaction to this medication  . Nabumetone Other (See Comments)    Headache  . Sulfa Antibiotics Other (See Comments)    Reaction is unknown  . Tetracycline Hcl Itching  . Tramadol Hcl Other (See Comments)    Reaction is unknown   Current Outpatient Medications on File Prior to Visit  Medication Sig Dispense Refill  . albuterol (VENTOLIN HFA) 108 (90 Base) MCG/ACT inhaler INHALE TWO  PUFFS INTO LUNGS EVERY 6 HOURS AS NEEDED FOR WHEEZING OR SHORTNESS OF BREATH 8.5 g 1  . ALPRAZolam (XANAX) 1 MG tablet Take 1 mg by mouth 4 (four) times daily.     . Azelastine-Fluticasone (DYMISTA) 137-50 MCG/ACT SUSP Place 1 spray 2 (two) times daily into the nose. 1 Bottle 3  . Cholecalciferol (VITAMIN D3) 1.25 MG (50000 UT) CAPS Take 1 capsule by mouth once a week. 12 capsule 1  . cyanocobalamin (,VITAMIN B-12,) 1000 MCG/ML injection INJECT 1 ML INTO THE MUSCLE EVERY 14 DAYS. 6 mL 3  . fluconazole (DIFLUCAN) 150 MG tablet TAKE 1 TABLET BY MOUTH FOR ONE DOSE 1 tablet 1  . fluticasone (FLONASE) 50 MCG/ACT nasal spray Place 2 sprays into both nostrils daily. 48 g 3  . fluticasone furoate-vilanterol (BREO ELLIPTA) 100-25 MCG/INH AEPB Inhale 1 puff into the lungs daily. 3 each 3  . levocetirizine (XYZAL) 5 MG tablet Take 1 tablet (5 mg total) by mouth every evening. 90 tablet 3  . levothyroxine (SYNTHROID) 50 MCG tablet Take 1 tablet (50 mcg total) by mouth daily before breakfast. 90 tablet 3  . methylphenidate (RITALIN) 20 MG tablet Take 1 tablet (20 mg total) by mouth daily.  0  . oxyCODONE-acetaminophen (PERCOCET/ROXICET) 5-325 MG tablet TAKE ONE TO ONE & 1/2 TABLETS BY MOUTH TWICE DAILY AS NEEDED FOR SEVERE pain 90 tablet 0  . pantoprazole (PROTONIX) 40 MG tablet Take 1 tablet (40 mg total) by mouth daily. 90 tablet 3  . QUEtiapine (SEROQUEL) 25 MG tablet Take 25 mg by mouth at bedtime.     Marland Kitchen Respiratory Therapy Supplies (FLUTTER) DEVI Use as directed 1 each 0  . SUMAtriptan (IMITREX) 100 MG tablet TAKE 1 TABLET BY MOUTH EVERY 2 HOURS AS NEEDED FOR MIGRAINE - MAY REPEAT in TWO HOURS if HEADACHE persists OR recurs 10 tablet 3  . triamcinolone ointment (KENALOG) 0.1 % Apply 1 application topically 2 (two) times daily. 80 g 3  . Vitamin D, Ergocalciferol, (DRISDOL) 1.25 MG (50000 UT) CAPS capsule TAKE 1 CAPSULE BY MOUTH EVERY SEVEN DAYS 12 capsule 0   No current facility-administered medications  on file prior to visit.    Observations/Objective: Alert, NAD, appropriate mood and affect, resps normal, cn 2-12 intact, moves all 4s, no visible rash or swelling Lab Results  Component Value Date   WBC 6.4 06/24/2019   HGB 13.9 06/24/2019   HCT 41.5 06/24/2019   PLT 163.0 06/24/2019   GLUCOSE 105 (H) 06/24/2019   CHOL 180 05/16/2018   TRIG 67.0 05/16/2018   HDL 69.80 05/16/2018   LDLCALC 96 05/16/2018   ALT 9 06/24/2019   AST 13 06/24/2019   NA 143 06/24/2019   K 3.7 06/24/2019   CL 105 06/24/2019   CREATININE 0.78 06/24/2019   BUN 13 06/24/2019   CO2 29 06/24/2019   TSH 1.99 06/24/2019   Assessment and Plan: See notes  Follow Up Instructions: See notes  I discussed the assessment and treatment plan with the patient. The patient was provided an opportunity to ask questions and all were answered. The patient agreed with the plan and demonstrated an understanding of the instructions.   The patient was advised to call back or seek an in-person evaluation if the symptoms worsen or if the condition fails to improve as anticipated.  Cathlean Cower, MD

## 2019-08-29 ENCOUNTER — Encounter: Payer: Self-pay | Admitting: Internal Medicine

## 2019-08-29 DIAGNOSIS — B002 Herpesviral gingivostomatitis and pharyngotonsillitis: Secondary | ICD-10-CM | POA: Insufficient documentation

## 2019-08-29 NOTE — Assessment & Plan Note (Signed)
stable overall by history and exam, recent data reviewed with pt, and pt to continue medical treatment as before,  to f/u any worsening symptoms or concerns  

## 2019-08-29 NOTE — Assessment & Plan Note (Signed)
D/w pt natural history of this outbreak, for valtrex asd,  to f/u any worsening symptoms or concerns

## 2019-09-10 ENCOUNTER — Other Ambulatory Visit: Payer: Self-pay | Admitting: Internal Medicine

## 2019-09-17 ENCOUNTER — Encounter: Payer: Self-pay | Admitting: Internal Medicine

## 2019-09-17 ENCOUNTER — Ambulatory Visit (INDEPENDENT_AMBULATORY_CARE_PROVIDER_SITE_OTHER): Payer: Medicare Other | Admitting: Internal Medicine

## 2019-09-17 ENCOUNTER — Other Ambulatory Visit: Payer: Self-pay

## 2019-09-17 DIAGNOSIS — F439 Reaction to severe stress, unspecified: Secondary | ICD-10-CM | POA: Diagnosis not present

## 2019-09-17 DIAGNOSIS — E034 Atrophy of thyroid (acquired): Secondary | ICD-10-CM | POA: Diagnosis not present

## 2019-09-17 DIAGNOSIS — E559 Vitamin D deficiency, unspecified: Secondary | ICD-10-CM

## 2019-09-17 DIAGNOSIS — E538 Deficiency of other specified B group vitamins: Secondary | ICD-10-CM | POA: Diagnosis not present

## 2019-09-17 DIAGNOSIS — F411 Generalized anxiety disorder: Secondary | ICD-10-CM | POA: Diagnosis not present

## 2019-09-17 MED ORDER — BREO ELLIPTA 100-25 MCG/INH IN AEPB: 1.0000 | INHALATION_SPRAY | Freq: Every day | RESPIRATORY_TRACT | 3 refills | Status: DC

## 2019-09-17 MED ORDER — TRIAMCINOLONE ACETONIDE 0.1 % EX OINT: 1.0000 "application " | TOPICAL_OINTMENT | Freq: Two times a day (BID) | CUTANEOUS | 3 refills | Status: DC

## 2019-09-17 MED ORDER — OXYCODONE-ACETAMINOPHEN 5-325 MG PO TABS: 1.0000 | ORAL_TABLET | Freq: Three times a day (TID) | ORAL | 0 refills | Status: DC | PRN

## 2019-09-17 MED ORDER — VITAMIN D3 1.25 MG (50000 UT) PO CAPS: 1.0000 | ORAL_CAPSULE | ORAL | 3 refills | Status: DC

## 2019-09-17 MED ORDER — FLUTICASONE PROPIONATE 50 MCG/ACT NA SUSP: 2.0000 | Freq: Every day | NASAL | 3 refills | Status: DC

## 2019-09-17 MED ORDER — PANTOPRAZOLE SODIUM 40 MG PO TBEC: 40.0000 mg | DELAYED_RELEASE_TABLET | Freq: Every day | ORAL | 3 refills | Status: DC

## 2019-09-17 MED ORDER — CYANOCOBALAMIN 1000 MCG/ML IJ SOLN: INTRAMUSCULAR | 3 refills | Status: DC

## 2019-09-17 MED ORDER — LEVOCETIRIZINE DIHYDROCHLORIDE 5 MG PO TABS: 5.0000 mg | ORAL_TABLET | Freq: Every evening | ORAL | 3 refills | Status: DC

## 2019-09-17 MED ORDER — LEVOTHYROXINE SODIUM 50 MCG PO TABS: 50.0000 ug | ORAL_TABLET | Freq: Every day | ORAL | 3 refills | Status: DC

## 2019-09-17 MED ORDER — ALBUTEROL SULFATE HFA 108 (90 BASE) MCG/ACT IN AERS: INHALATION_SPRAY | RESPIRATORY_TRACT | 1 refills | Status: DC

## 2019-09-17 NOTE — Assessment & Plan Note (Signed)
On Levothroid 

## 2019-09-17 NOTE — Assessment & Plan Note (Signed)
On Vit D Rx

## 2019-09-17 NOTE — Progress Notes (Signed)
Virtual Visit via Video Note  I connected with Melody [name] on 09/17/19 at  2:20 PM EST by a video enabled telemedicine application and verified that I am speaking with the correct person using two identifiers.   I discussed the limitations of evaluation and management by telemedicine and the availability of in person appointments. The patient expressed understanding and agreed to proceed.  History of Present Illness: We need to follow-up on stress w/son - she moved out of the house, slept at the motel. Seeing a legal representative at "[employer]" today. F/u LBP, B12 def, Vit D def  There has been no  chest pain,  abdominal pain, diarrhea, constipation.   Observations/Objective: The patient appears to be in no acute distress, looks sad.  Assessment and Plan:  See my Assessment and Plan. Follow Up Instructions:    I discussed the assessment and treatment plan with the patient. The patient was provided an opportunity to ask questions and all were answered. The patient agreed with the plan and demonstrated an understanding of the instructions.   The patient was advised to call back or seek an in-person evaluation if the symptoms worsen or if the condition fails to improve as anticipated.  I provided face-to-face time during this encounter. We were at different locations.   Walker Kehr, MD

## 2019-09-17 NOTE — Assessment & Plan Note (Signed)
Percocet Potential benefits of a long term opioids use as well as potential risks (i.e. addiction risk, apnea etc) and complications (i.e. Somnolence, constipation and others) were explained to the patient and were aknowledged. 

## 2019-09-17 NOTE — Assessment & Plan Note (Signed)
Worsening stress w/son - she moved out of the house, slept at the motel. Seeing a legal representative at "[employer]" today. Discussed

## 2019-09-17 NOTE — Assessment & Plan Note (Signed)
On Vit B12 inj

## 2019-10-02 DIAGNOSIS — M7542 Impingement syndrome of left shoulder: Secondary | ICD-10-CM | POA: Diagnosis not present

## 2019-10-02 DIAGNOSIS — M25512 Pain in left shoulder: Secondary | ICD-10-CM | POA: Diagnosis not present

## 2019-10-02 DIAGNOSIS — M7502 Adhesive capsulitis of left shoulder: Secondary | ICD-10-CM | POA: Diagnosis not present

## 2019-10-19 DIAGNOSIS — M7542 Impingement syndrome of left shoulder: Secondary | ICD-10-CM | POA: Diagnosis not present

## 2019-10-19 DIAGNOSIS — M7502 Adhesive capsulitis of left shoulder: Secondary | ICD-10-CM | POA: Diagnosis not present

## 2019-10-19 DIAGNOSIS — M6281 Muscle weakness (generalized): Secondary | ICD-10-CM | POA: Diagnosis not present

## 2019-10-19 DIAGNOSIS — M25612 Stiffness of left shoulder, not elsewhere classified: Secondary | ICD-10-CM | POA: Diagnosis not present

## 2019-10-25 DIAGNOSIS — J449 Chronic obstructive pulmonary disease, unspecified: Secondary | ICD-10-CM | POA: Diagnosis not present

## 2019-10-25 DIAGNOSIS — R05 Cough: Secondary | ICD-10-CM | POA: Diagnosis not present

## 2019-10-25 DIAGNOSIS — Z79899 Other long term (current) drug therapy: Secondary | ICD-10-CM | POA: Diagnosis not present

## 2019-10-25 DIAGNOSIS — R0789 Other chest pain: Secondary | ICD-10-CM | POA: Diagnosis not present

## 2019-10-25 DIAGNOSIS — K219 Gastro-esophageal reflux disease without esophagitis: Secondary | ICD-10-CM | POA: Diagnosis not present

## 2019-10-25 DIAGNOSIS — Z888 Allergy status to other drugs, medicaments and biological substances status: Secondary | ICD-10-CM | POA: Diagnosis not present

## 2019-10-25 DIAGNOSIS — Z87891 Personal history of nicotine dependence: Secondary | ICD-10-CM | POA: Diagnosis not present

## 2019-10-25 DIAGNOSIS — E079 Disorder of thyroid, unspecified: Secondary | ICD-10-CM | POA: Diagnosis not present

## 2019-10-25 DIAGNOSIS — R519 Headache, unspecified: Secondary | ICD-10-CM | POA: Diagnosis not present

## 2019-10-25 DIAGNOSIS — R042 Hemoptysis: Secondary | ICD-10-CM | POA: Diagnosis not present

## 2019-10-25 DIAGNOSIS — R55 Syncope and collapse: Secondary | ICD-10-CM | POA: Diagnosis not present

## 2019-10-25 DIAGNOSIS — R42 Dizziness and giddiness: Secondary | ICD-10-CM | POA: Diagnosis not present

## 2019-10-25 DIAGNOSIS — R001 Bradycardia, unspecified: Secondary | ICD-10-CM | POA: Diagnosis not present

## 2019-10-28 DIAGNOSIS — M6281 Muscle weakness (generalized): Secondary | ICD-10-CM | POA: Diagnosis not present

## 2019-10-28 DIAGNOSIS — M25612 Stiffness of left shoulder, not elsewhere classified: Secondary | ICD-10-CM | POA: Diagnosis not present

## 2019-10-28 DIAGNOSIS — M7542 Impingement syndrome of left shoulder: Secondary | ICD-10-CM | POA: Diagnosis not present

## 2019-10-28 DIAGNOSIS — M7502 Adhesive capsulitis of left shoulder: Secondary | ICD-10-CM | POA: Diagnosis not present

## 2019-11-03 ENCOUNTER — Other Ambulatory Visit: Payer: Self-pay | Admitting: Internal Medicine

## 2019-11-12 ENCOUNTER — Other Ambulatory Visit: Payer: Self-pay | Admitting: Internal Medicine

## 2019-11-12 NOTE — Telephone Encounter (Signed)
Check [place] registry last filled 10/14/2019../lmb  

## 2019-11-25 ENCOUNTER — Telehealth: Payer: Self-pay

## 2019-11-25 NOTE — Telephone Encounter (Signed)
Called patient to reschedule her 12/23/2019 appointment. Unable to leave voicemail due to not being set up. Please reschedule if she calls back

## 2019-11-27 ENCOUNTER — Other Ambulatory Visit: Payer: Self-pay | Admitting: Internal Medicine

## 2019-12-14 ENCOUNTER — Other Ambulatory Visit: Payer: Self-pay | Admitting: Internal Medicine

## 2019-12-23 ENCOUNTER — Encounter: Payer: Medicare Other | Admitting: Internal Medicine

## 2019-12-29 ENCOUNTER — Other Ambulatory Visit: Payer: Self-pay | Admitting: Internal Medicine

## 2019-12-30 ENCOUNTER — Other Ambulatory Visit: Payer: Self-pay

## 2019-12-30 ENCOUNTER — Encounter: Payer: Self-pay | Admitting: Internal Medicine

## 2019-12-30 ENCOUNTER — Ambulatory Visit (INDEPENDENT_AMBULATORY_CARE_PROVIDER_SITE_OTHER): Payer: Medicare Other | Admitting: Internal Medicine

## 2019-12-30 DIAGNOSIS — R42 Dizziness and giddiness: Secondary | ICD-10-CM | POA: Diagnosis not present

## 2019-12-30 DIAGNOSIS — G8929 Other chronic pain: Secondary | ICD-10-CM | POA: Diagnosis not present

## 2019-12-30 DIAGNOSIS — F419 Anxiety disorder, unspecified: Secondary | ICD-10-CM | POA: Diagnosis not present

## 2019-12-30 DIAGNOSIS — M5440 Lumbago with sciatica, unspecified side: Secondary | ICD-10-CM

## 2019-12-30 DIAGNOSIS — E538 Deficiency of other specified B group vitamins: Secondary | ICD-10-CM

## 2019-12-30 DIAGNOSIS — R002 Palpitations: Secondary | ICD-10-CM

## 2019-12-30 DIAGNOSIS — E034 Atrophy of thyroid (acquired): Secondary | ICD-10-CM

## 2019-12-30 LAB — URINALYSIS
Bilirubin Urine: NEGATIVE
Hgb urine dipstick: NEGATIVE
Ketones, ur: NEGATIVE
Leukocytes,Ua: NEGATIVE
Nitrite: NEGATIVE
Specific Gravity, Urine: 1.02 (ref 1.000–1.030)
Total Protein, Urine: NEGATIVE
Urine Glucose: NEGATIVE
Urobilinogen, UA: 0.2 (ref 0.0–1.0)
pH: 7.5 (ref 5.0–8.0)

## 2019-12-30 LAB — BASIC METABOLIC PANEL
BUN: 12 mg/dL (ref 6–23)
CO2: 33 mEq/L — ABNORMAL HIGH (ref 19–32)
Calcium: 9.7 mg/dL (ref 8.4–10.5)
Chloride: 103 mEq/L (ref 96–112)
Creatinine, Ser: 0.71 mg/dL (ref 0.40–1.20)
GFR: 82.59 mL/min (ref >60.00)
Glucose, Bld: 83 mg/dL (ref 70–99)
Potassium: 3.4 mEq/L — ABNORMAL LOW (ref 3.5–5.1)
Sodium: 141 mEq/L (ref 135–145)

## 2019-12-30 LAB — CBC WITH DIFFERENTIAL/PLATELET
Basophils Absolute: 0.1 10*3/uL (ref 0.0–0.1)
Basophils Relative: 0.8 % (ref 0.0–3.0)
Eosinophils Absolute: 0 10*3/uL (ref 0.0–0.7)
Eosinophils Relative: 0.5 % (ref 0.0–5.0)
HCT: 40.6 % (ref 36.0–46.0)
Hemoglobin: 13.8 g/dL (ref 12.0–15.0)
Lymphocytes Relative: 16.5 % (ref 12.0–46.0)
Lymphs Abs: 1.1 10*3/uL (ref 0.7–4.0)
MCHC: 34 g/dL (ref 30.0–36.0)
MCV: 88.3 fl (ref 78.0–100.0)
Monocytes Absolute: 0.3 10*3/uL (ref 0.1–1.0)
Monocytes Relative: 4.4 % (ref 3.0–12.0)
Neutro Abs: 5.4 10*3/uL (ref 1.4–7.7)
Neutrophils Relative %: 77.8 % — ABNORMAL HIGH (ref 43.0–77.0)
Platelets: 162 10*3/uL (ref 150.0–400.0)
RBC: 4.6 Mil/uL (ref 3.87–5.11)
RDW: 14.1 % (ref 11.5–15.5)
WBC: 6.9 10*3/uL (ref 4.0–10.5)

## 2019-12-30 LAB — HEPATIC FUNCTION PANEL
ALT: 9 U/L (ref 0–35)
AST: 14 U/L (ref 0–37)
Albumin: 4.1 g/dL (ref 3.5–5.2)
Alkaline Phosphatase: 65 U/L (ref 39–117)
Bilirubin, Direct: 0.1 mg/dL (ref 0.0–0.3)
Total Bilirubin: 0.8 mg/dL (ref 0.2–1.2)
Total Protein: 6.9 g/dL (ref 6.0–8.3)

## 2019-12-30 LAB — TSH: TSH: 3.51 u[IU]/mL (ref 0.35–4.50)

## 2019-12-30 LAB — VITAMIN B12: Vitamin B-12: 350 pg/mL (ref 211–911)

## 2019-12-30 NOTE — Patient Instructions (Addendum)
Get a carbon monoxide detector  Carbon Monoxide Poisoning Carbon monoxide poisoning is sickness from breathing in carbon monoxide gas. This gas has no color or odor. When you breathe in the gas, it lowers the amount of oxygen in your blood. This condition is an emergency. What are the causes? This condition is caused by breathing in carbon monoxide gas. This can happen when fumes from gas, coal, charcoal, or wood build up in a closed area without enough air flow.  This can happen by: ? Using a chimney that is not clear. ? Using a camping stove indoors. ? Leaving a car running in a closed garage or near an open window.  Sources of carbon monoxide fumes include: ? Motor exhaust from cars, motorcycles, and boats. ? Cigarette smoke. ? Propane-powered tools and vehicles. ? Gas-powered tools and other equipment. ? Gas stove tops and grills. ? Fireplaces. ? Furnaces. What are the signs or symptoms? Common symptoms are:  Headache.  Dizziness.  Sleepiness.  Weakness and tiredness (fatigue).  Feeling like you may vomit (nausea). Other symptoms:  Vomiting.  Fainting.  Confusion.  Chest pain.  Fast heart rate and fast breathing.  Having a hard time breathing (shortness of breath).  Trouble walking.  Jerky movements that you cannot control (seizures).  Coma. People who are older or who have heart or lung problems are more likely to have worse symptoms. How is this treated?   Moving to fresh air right away and removing yourself from the dangerous area.  Having oxygen delivered through: ? A face mask. ? Breathing tubes that fit under your nose.  In very bad cases, treatment may include: ? A breathing tube that is placed down your throat. ? A hyperbaric chamber. You lie inside of the chamber and oxygen is pumped into the chamber. Follow these instructions at home:  Do not go back to the area where you breathed in the gas. It must be aired out before it is safe to  go back.  Take over-the-counter and prescription medicines only as told by your doctor.  Do not use any products that contain nicotine or tobacco, such as cigarettes, e-cigarettes, and chewing tobacco. If you need help quitting, ask your doctor.  Return to your normal activities as told by your doctor. Ask your doctor what activities are safe for you.  Keep all follow-up visits as told by your doctor. This is important. How is this prevented?   Put a carbon monoxide detector in your home. Change the batteries every 6 months.  Have gas stoves and furnaces checked one time each year.  Clean all chimneys and flues at least one time each year.  Have the exhaust system in your car checked one time each year.  Do not keep a car's motor running in a closed garage.  Do not sleep in a car with the motor on.  Make sure all rooms that are heated with gas, coal, charcoal, or wood have good air flow. Get help right away if:  You think you or someone else has breathed in carbon monoxide gas. If this happens, get away from the area.Get medical help right away. Call your local emergency services (911 in the [place] Do not drive yourself to the hospital. Summary  Carbon monoxide poisoning is sickness from breathing in carbon monoxide gas.  It is an emergency. You may need oxygen through a mask or breathing tube.  Put a carbon monoxide detector in your home. Change the batteries every 6 months.  Do not go back to the area where you breathed in the gas. It must be aired out before it is safe to go back.  If you think you or someone else has breathed in carbon monoxide gas, get away from the area. Get help right away. This information is not intended to replace advice given to you by your health care provider. Make sure you discuss any questions you have with your health care provider. Document Revised: 02/16/2019 Document Reviewed: 02/16/2019 Elsevier Patient Education  [place].

## 2019-12-30 NOTE — Assessment & Plan Note (Signed)
Percocet prn  Potential benefits of a long term opioids use as well as potential risks (i.e. addiction risk, apnea etc) and complications (i.e. Somnolence, constipation and others) were explained to the patient and were aknowledged.  Potential benefits of a long term NSAID  use as well as potential risks  and complications were explained to the patient and were aknowledged. 

## 2019-12-30 NOTE — Assessment & Plan Note (Signed)
Seroquel, Xanax

## 2019-12-30 NOTE — Assessment & Plan Note (Signed)
?  BPV vs other Get a carbon monoxide detector

## 2019-12-30 NOTE — Assessment & Plan Note (Signed)
On B12 

## 2019-12-30 NOTE — Assessment & Plan Note (Signed)
levothroid  labs

## 2019-12-30 NOTE — Progress Notes (Signed)
Subjective:  Patient ID: Melody [name], female    DOB: August 14, 1956  Age: 64 y.o. MRN: [id number]  CC: No chief complaint on file.   HPI Melody [name] presents for chronic pain, GERD C/o wood stove stove smoke made her dizzy, HA  Outpatient Medications Prior to Visit  Medication Sig Dispense Refill  . albuterol (VENTOLIN HFA) 108 (90 Base) MCG/ACT inhaler INHALE TWO PUFFS INTO LUNGS EVERY 6 HOURS AS NEEDED FOR WHEEZING OR SHORTNESS OF BREATH 8.5 g 1  . ALPRAZolam (XANAX) 1 MG tablet Take 1 mg by mouth 4 (four) times daily.     . Azelastine-Fluticasone (DYMISTA) 137-50 MCG/ACT SUSP Place 1 spray 2 (two) times daily into the nose. 1 Bottle 3  . Cholecalciferol (VITAMIN D3) 1.25 MG (50000 UT) CAPS Take 1 capsule by mouth every 14 (fourteen) days. 6 capsule 3  . cyanocobalamin (,VITAMIN B-12,) 1000 MCG/ML injection INJECT 1 ML SQ EVERY 14 DAYS. 6 mL 3  . fluticasone (FLONASE) 50 MCG/ACT nasal spray Place 2 sprays into both nostrils daily. 48 g 3  . fluticasone furoate-vilanterol (BREO ELLIPTA) 100-25 MCG/INH AEPB Inhale 1 puff into the lungs daily. 3 each 3  . levocetirizine (XYZAL) 5 MG tablet Take 1 tablet (5 mg total) by mouth every evening. 90 tablet 3  . levothyroxine (SYNTHROID) 50 MCG tablet Take 1 tablet (50 mcg total) by mouth daily before breakfast. 90 tablet 3  . methylphenidate (RITALIN) 20 MG tablet Take 1 tablet (20 mg total) by mouth daily.  0  . oxyCODONE-acetaminophen (PERCOCET/ROXICET) 5-325 MG tablet TAKE 1 TABLET BY MOUTH EVERY 8 HOURS AS NEEDED FOR SEVERE PAIN 90 tablet 0  . pantoprazole (PROTONIX) 40 MG tablet Take 1 tablet (40 mg total) by mouth daily. 90 tablet 3  . QUEtiapine (SEROQUEL) 25 MG tablet Take 25 mg by mouth at bedtime.     Marland Kitchen Respiratory Therapy Supplies (FLUTTER) DEVI Use as directed 1 each 0  . SUMAtriptan (IMITREX) 100 MG tablet TAKE 1 TABLET BY MOUTH EVERY 2 HOURS AS NEEDED FOR MIGRAINE - MAY REPEAT in TWO hours if HEADACHE persists OR recurs 10 tablet  2  . triamcinolone ointment (KENALOG) 0.1 % Apply 1 application topically 2 (two) times daily. 80 g 3  . valACYclovir (VALTREX) 1000 MG tablet Take 1 tablet (1,000 mg total) by mouth 3 (three) times daily. 21 tablet 0  . fluconazole (DIFLUCAN) 150 MG tablet TAKE 1 TABLET BY MOUTH FOR ONE DOSE 1 tablet 1   No facility-administered medications prior to visit.    ROS: Review of Systems  Constitutional: Positive for fatigue. Negative for activity change, appetite change, chills and unexpected weight change.  HENT: Negative for congestion, mouth sores and sinus pressure.   Eyes: Negative for visual disturbance.  Respiratory: Negative for cough and chest tightness.   Gastrointestinal: Negative for abdominal pain and nausea.  Genitourinary: Negative for difficulty urinating, frequency and vaginal pain.  Musculoskeletal: Positive for arthralgias, back pain and neck stiffness. Negative for gait problem.  Skin: Negative for pallor and rash.  Neurological: Negative for dizziness, tremors, weakness, numbness and headaches.  Psychiatric/Behavioral: Positive for decreased concentration and dysphoric mood. Negative for confusion, sleep disturbance and suicidal ideas. The patient is nervous/anxious.     Objective:  BP 140/84 (BP Location: Left Arm, Patient Position: Sitting, Cuff Size: Normal)   Pulse 68   Temp 98.5 F (36.9 C) (Oral)   Ht 5\' 6"  (1.676 m)   Wt 132 lb (59.9 kg)   LMP  10/29/2006 (Approximate)   SpO2 97%   BMI 21.31 kg/m   BP Readings from Last 3 Encounters:  12/30/19 140/84  06/24/19 130/90  12/23/18 126/80    Wt Readings from Last 3 Encounters:  12/30/19 132 lb (59.9 kg)  06/24/19 135 lb (61.2 kg)  12/23/18 142 lb (64.4 kg)    Physical Exam Constitutional:      General: She is not in acute distress.    Appearance: Normal appearance. She is well-developed.  HENT:     Head: Normocephalic.     Right Ear: External ear normal.     Left Ear: External ear normal.      Nose: Nose normal.  Eyes:     General:        Right eye: No discharge.        Left eye: No discharge.     Conjunctiva/sclera: Conjunctivae normal.     Pupils: Pupils are equal, round, and reactive to Lefeber.  Neck:     Thyroid: No thyromegaly.     Vascular: No JVD.     Trachea: No tracheal deviation.  Cardiovascular:     Rate and Rhythm: Normal rate and regular rhythm.     Heart sounds: Normal heart sounds.  Pulmonary:     Effort: No respiratory distress.     Breath sounds: No stridor. No wheezing.  Abdominal:     General: Bowel sounds are normal. There is no distension.     Palpations: Abdomen is soft. There is no mass.     Tenderness: There is no abdominal tenderness. There is no guarding or rebound.  Musculoskeletal:        General: Tenderness present.     Cervical back: Normal range of motion and neck supple.  Lymphadenopathy:     Cervical: No cervical adenopathy.  Skin:    Findings: No erythema or rash.  Neurological:     Mental Status: She is oriented to person, place, and time.     Cranial Nerves: No cranial nerve deficit.     Motor: No abnormal muscle tone.     Coordination: Coordination normal.     Gait: Gait abnormal.     Deep Tendon Reflexes: Reflexes normal.  Psychiatric:        Behavior: Behavior normal.        Thought Content: Thought content normal.        Judgment: Judgment normal.    LS spine w/pain   Lab Results  Component Value Date   WBC 6.4 06/24/2019   HGB 13.9 06/24/2019   HCT 41.5 06/24/2019   PLT 163.0 06/24/2019   GLUCOSE 105 (H) 06/24/2019   CHOL 180 05/16/2018   TRIG 67.0 05/16/2018   HDL 69.80 05/16/2018   LDLCALC 96 05/16/2018   ALT 9 06/24/2019   AST 13 06/24/2019   NA 143 06/24/2019   K 3.7 06/24/2019   CL 105 06/24/2019   CREATININE 0.78 06/24/2019   BUN 13 06/24/2019   CO2 29 06/24/2019   TSH 1.99 06/24/2019    CT CHEST W CONTRAST  Result Date: 04/03/2019 CLINICAL DATA:  Recurrent pneumonia EXAM: CT CHEST WITH  CONTRAST TECHNIQUE: Multidetector CT imaging of the chest was performed during intravenous contrast administration. CONTRAST:  74mL OMNIPAQUE IOHEXOL 300 MG/ML  SOLN COMPARISON:  Chest radiograph, 11/07/2018, CT chest, 02/26/2017 FINDINGS: Cardiovascular: No significant vascular findings. Normal heart size. No pericardial effusion. Mediastinum/Nodes: No enlarged mediastinal, hilar, or axillary lymph nodes. Thyroid gland, trachea, and esophagus demonstrate no significant findings. Lungs/Pleura:  There is extensive bilateral, right greater than left clustered tree-in-bud and centrilobular nodularity with bandlike scarring, consolidation, and bronchiectasis of the right middle lobe and lingula. These findings are generally improved compared to prior CT dated 02/26/2017, although there are areas of new and fluctuant nodularity, for example in the right lung base (series 4, image 122). No pleural effusion or pneumothorax. Upper Abdomen: No acute abnormality. Musculoskeletal: No chest wall mass or suspicious bone lesions identified. IMPRESSION: There is extensive bilateral, right greater than left clustered tree-in-bud and centrilobular nodularity with bandlike scarring, consolidation, and bronchiectasis of the right middle lobe and lingula. These findings are generally improved compared to prior CT dated 02/26/2017, although there are areas of new and fluctuant nodularity, for example in the right lung base (series 4, image 122). 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 Electronically Signed   By: Eddie Candle M.D.   On: 04/03/2019 20:58    Assessment & Plan:    Walker Kehr, MD

## 2019-12-30 NOTE — Assessment & Plan Note (Addendum)
EKG- refused Get a carbon monoxide detector Pt saw a heart dr and given a monitor

## 2020-01-12 ENCOUNTER — Other Ambulatory Visit: Payer: Self-pay | Admitting: Internal Medicine

## 2020-01-13 ENCOUNTER — Telehealth: Payer: Self-pay | Admitting: Internal Medicine

## 2020-01-13 NOTE — Telephone Encounter (Signed)
New message:   1.Medication Requested: oxyCODONE-acetaminophen (PERCOCET/ROXICET) 5-325 MG tablet 2. Pharmacy (Name, Street, [place]): [place], [place] 3. On Med List: yes  4. Last Visit with PCP: 12/30/19  5. Next visit date with PCP:03/31/20   Agent: Please be advised that RX refills may take up to 3 business days. We ask that you follow-up with your pharmacy.

## 2020-01-14 NOTE — Telephone Encounter (Signed)
° °  Patient calling for status of refill. Advised patient to allow more time Requesting call when complete

## 2020-01-15 MED ORDER — OXYCODONE-ACETAMINOPHEN 5-325 MG PO TABS: ORAL_TABLET | ORAL | 0 refills | Status: DC

## 2020-01-15 NOTE — Telephone Encounter (Signed)
Okay.  Thanks.

## 2020-01-15 NOTE — Telephone Encounter (Signed)
Sent in today 

## 2020-01-27 ENCOUNTER — Other Ambulatory Visit: Payer: Self-pay | Admitting: Internal Medicine

## 2020-02-04 ENCOUNTER — Other Ambulatory Visit: Payer: Self-pay | Admitting: Internal Medicine

## 2020-02-16 ENCOUNTER — Other Ambulatory Visit: Payer: Self-pay | Admitting: Internal Medicine

## 2020-03-11 ENCOUNTER — Other Ambulatory Visit: Payer: Self-pay | Admitting: Internal Medicine

## 2020-03-15 ENCOUNTER — Encounter: Payer: Self-pay | Admitting: Internal Medicine

## 2020-03-15 ENCOUNTER — Ambulatory Visit (INDEPENDENT_AMBULATORY_CARE_PROVIDER_SITE_OTHER): Payer: Medicare Other | Admitting: Internal Medicine

## 2020-03-15 ENCOUNTER — Other Ambulatory Visit: Payer: Self-pay

## 2020-03-15 ENCOUNTER — Ambulatory Visit (HOSPITAL_COMMUNITY)
Admission: RE | Admit: 2020-03-15 | Discharge: 2020-03-15 | Disposition: A | Discharge status: Home / Self Care | Payer: Medicare Other | Source: Ambulatory Visit | Attending: Internal Medicine | Admitting: Internal Medicine

## 2020-03-15 DIAGNOSIS — R918 Other nonspecific abnormal finding of lung field: Secondary | ICD-10-CM

## 2020-03-15 DIAGNOSIS — R05 Cough: Secondary | ICD-10-CM | POA: Diagnosis not present

## 2020-03-15 DIAGNOSIS — J449 Chronic obstructive pulmonary disease, unspecified: Secondary | ICD-10-CM | POA: Insufficient documentation

## 2020-03-15 MED ORDER — DULERA 100-5 MCG/ACT IN AERO: INHALATION_SPRAY | RESPIRATORY_TRACT | 11 refills | Status: DC

## 2020-03-15 NOTE — Assessment & Plan Note (Addendum)
Quit smoking 2012  - 03/15/2020  Very poor hfa but cough / throat irritation from BREO > try dulera 100 2bid     Main concern is cough which is not typical of copd or bronchiectasis and may just be from the upper airway effects of DPI > rec change to low dose ICS/LABA and regroup in 3 months, sooner if needed   Pt informed of the seriousness of COVID 19 infection as a direct risk to lung health  and safey and to close contacts and should continue to wear a facemask in public and minimize exposure to public locations but especially avoid any area or activity where non-close contacts are not observing distancing or wearing an appropriate face mask.  I strongly recommended vaccine asap and she said she would consider based on risk /benefit discussion today.

## 2020-03-15 NOTE — Patient Instructions (Addendum)
Plan A = Automatic = Always=   Stop Breo (powder) Dulera 100 Take 2 puffs first thing in am and then another 2 puffs about 12 hours later.   Work on inhaler technique:  relax and gently blow all the way out then take a nice smooth deep breath back in, triggering the inhaler at same time you start breathing in.  Hold for up to 5 seconds if you can. Blow out thru nose. Rinse and gargle with water when done- ok to use to use arm and hammer toothpaste    Plan B = Backup (to supplement plan A, not to replace it) Only use your albuterol inhaler as a rescue medication to be used if you can't catch your breath by resting or doing a relaxed purse lip breathing pattern.  - The less you use it, the better it will work when you need it. - Ok to use the inhaler up to 2 puffs  every 4 hours if you must but call for appointment if use goes up over your usual need - Don't leave home without it !!  (think of it like the spare tire for your car)   Plan C = Crisis (instead of Plan B but only if Plan B stops working) - only use your albuterol nebulizer if you first try Plan B and it fails to help > ok to use the nebulizer up to every 4 hours but if start needing it regularly call for immediate appointment    I strongly recommend you get Pfizer or Moderna vaccine asap   Please remember to go to the  x-ray department  for your tests - we will call you with the results when they are available    Please schedule a follow up visit in 3 months but call sooner if needed - please bring inhalers and nebulizer solution

## 2020-03-15 NOTE — Progress Notes (Signed)
Melody [name], female    DOB: 08/07/1956,     MRN: [id number]   Brief patient profile:  17 yowf quit smoking  04/2011  prev followed by [facility] then [place] with dx of bronchiectasis/ MAC colonization with GOLD I criteria for copd 04/2017 self referred to pulmonary clinic in [place]  03/15/2020     LABS 04/04/17 IgG: 1066 IgA: 135 IgM: 153 IgE: 15 RAST panel: Negative ANA:  1:80 RF:  <14 Anti-CCP:  <16 SSA:  <1.0 SSB:  <1.0    08/17/16 ANA: Negative Rheumatoid factor:  <14    Admit date: 11/07/2018 Discharge date: 11/10/2018   Brief Hospitalization Summary: Please see all hospital notes, images, labs for full details of the hospitalization. [name] a62 y.o.female,with history of bronchiectasis, depression, GERD, hypothyroidism, MAI came to hospital with complaint of shortness of breath, cough. Patient was recently discharged from the hospital on 10/02/2018. Patient says that she has experienced worsening shortness of breath, coughing up phlegm.   In the ED chest x-ray showed diffusely increased interstitial opacity with mild nodularity on the right suspect for acute interstitial inflammatory infectious process superimposed on underlying chronic changes Patient started on IV Levaquin.  Brief Admission Hx: 64 y.o.female,with history of bronchiectasis, depression, GERD, hypothyroidism, MAI came to hospital with complaint of shortness of breath, cough.  MDM/Assessment & Plan:  1. Healthcare associated pneumonia-given that she was hospitalized last month, seen on chest x-ray, patient started on IV Levaquin. She is clinically improving and feeling better, ambulating in the room.Continue levofloxacin.  She is on room air.   Blood culture no growth to date. Influenza panel is negative  2. Bronchiectasis/COPD-treated with DuoNebs every 6 hours, Solu-Medrol 60 mg IV every 6 hours, Mucinex 1200 mg p.o. twice daily. Add cough medication.  Discharged home on a  prednisone taper.  3. Hypokalemia-repleted.  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  Because of difficulty with transportation, the patient has requested to follow-up and establish [name]. Luan Pulling so that she can be seen by a local pulmonologist.  She understands that he is retiring at the end of the year but another pulmonologist is going to be taking over that practice.  5. Hypothyroidism-continue Synthroid   6  Bipolar Disorder - Resumed home medications.   History of Present Illness  03/15/2020  Pulmonary/ 1st office eval/Melody [name] / Melody [name]  Chief Complaint  Patient presents with  . Pulmonary Consult    Former pt of Melody Luan Pulling. She c/o occ non prod cough.    Living in same house since around 2008 with neg allergy profile for mold as above   Dyspnea:  MMRC2 = can't walk a nl pace on a flat grade s sob but does fine slow and flat eg Cough: some worse p mowed grass one week prior to OV on Breo which "makes her cough" like something tickling her throat    Sleep: great p sleep med per Melody [name] s resp complaints SABA use: one daily/ rarely   No obvious day to day or daytime variability or assoc excess/ purulent sputum or mucus plugs or hemoptysis or cp or chest tightness, subjective wheeze or overt sinus or hb symptoms.   Sleeping  without nocturnal  or early am exacerbation  of respiratory  c/o's or need for noct saba. Also denies any obvious fluctuation of symptoms with weather or environmental changes or other aggravating or alleviating factors except as outlined above   No unusual exposure hx or h/o childhood pna/ asthma  or knowledge of premature birth.  Current Allergies, Complete Past Medical History, Past Surgical History, Family History, and Social History were reviewed in [employer] record.  ROS  The following are not active complaints unless bolded Hoarseness, sore throat, dysphagia= globus sensation , dental  problems, itching, sneezing,  nasal congestion or discharge of excess mucus or purulent secretions, ear ache,   fever, chills, sweats, unintended wt loss or wt gain, classically pleuritic or exertional cp,  orthopnea pnd or arm/hand swelling  or leg swelling, presyncope, palpitations, abdominal pain, anorexia, nausea, vomiting, diarrhea  or change in bowel habits or change in bladder habits, change in stools or change in urine, dysuria, hematuria,  rash, arthralgias, visual complaints, headache, numbness, weakness or ataxia or problems with walking or coordination,  change in mood or  memory.           Past Medical History:  Diagnosis Date  . Anxiety   . Bronchiectasis ([place])   . Chronic back pain   . Chronic neck pain   . Depression    bipolar- Melody Melody [name]  . Fatigue   . Fibromyalgia   . GERD (gastroesophageal reflux disease)   . Headache   . Hypothyroidism   . Low back pain    Melody [name]  . 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([place])   . Osteoarthritis   . Pneumonia   . Pulmonary nodule   . Sinus congestion   . Thyroid disease    hypothyroidsm  . Vertigo   . Vitamin B12 deficiency     Outpatient Medications Prior to Visit  Medication Sig Dispense Refill  . albuterol (VENTOLIN HFA) 108 (90 Base) MCG/ACT inhaler INHALE TWO PUFFS INTO LUNGS EVERY 6 HOURS AS NEEDED FOR WHEEZING OR SHORTNESS OF BREATH 8.5 g 5  . ALPRAZolam (XANAX) 1 MG tablet Take 1 mg by mouth 4 (four) times daily.     . Azelastine-Fluticasone (DYMISTA) 137-50 MCG/ACT SUSP Place 1 spray 2 (two) times daily into the nose. 1 Bottle 3  . Cholecalciferol (VITAMIN D3) 1.25 MG (50000 UT) CAPS Take 1 capsule by mouth every 14 (fourteen) days. 6 capsule 3  . cyanocobalamin (,VITAMIN B-12,) 1000 MCG/ML injection INJECT 1 ML SQ EVERY 14 DAYS. 6 mL 3  . fluticasone (FLONASE) 50 MCG/ACT nasal spray Place 2 sprays into both nostrils daily. 48 g 3  . fluticasone furoate-vilanterol (BREO ELLIPTA) 100-25 MCG/INH AEPB Inhale 1 puff  into the lungs daily. 3 each 3  . levocetirizine (XYZAL) 5 MG tablet Take 1 tablet (5 mg total) by mouth every evening. 90 tablet 3  . levothyroxine (SYNTHROID) 50 MCG tablet Take 1 tablet (50 mcg total) by mouth daily before breakfast. 90 tablet 3  . methylphenidate (RITALIN) 20 MG tablet Take 1 tablet (20 mg total) by mouth daily.  0  . oxyCODONE-acetaminophen (PERCOCET/ROXICET) 5-325 MG tablet TAKE 1 TABLET BY MOUTH EVERY 8 HOURS AS NEEDED FOR PAIN 90 tablet 0  . pantoprazole (PROTONIX) 40 MG tablet Take 1 tablet (40 mg total) by mouth daily. 90 tablet 3  . QUEtiapine (SEROQUEL) 25 MG tablet Take 25 mg by mouth at bedtime.     Marland Kitchen Respiratory Therapy Supplies (FLUTTER) DEVI Use as directed 1 each 0  . SUMAtriptan (IMITREX) 100 MG tablet TAKE 1 TABLET BY MOUTH EVERY 2 HOURS AS NEEDED FOR MIGRAINE - MAY REPEAT in TWO hours if HEADACHE persists OR recurs 10 tablet 2  . triamcinolone ointment (KENALOG) 0.1 % Apply 1 application topically 2 (two) times daily.  80 g 3  . valACYclovir (VALTREX) 1000 MG tablet Take 1 tablet (1,000 mg total) by mouth 3 (three) times daily. 21 tablet 0   No facility-administered medications prior to visit.     Objective:     BP (!) 142/78 (BP Location: Left Arm, Cuff Size: Normal)   Pulse 65   Temp (!) 97 F (36.1 C) (Temporal)   Ht _0  (1.676 m)   Wt 130 lb (59 kg)   LMP 10/29/2006 (Approximate)   SpO2 100%   BMI 20.98 kg/m   SpO2: 100 %   Anxious wf nad   Few squeaks/ pops on insp/ no wheeze    HEENT : pt wearing mask not removed for exam due to covid -19 concerns.    NECK :  without JVD/Nodes/TM/ nl carotid upstrokes bilaterally   LUNGS: no acc muscle use,  Nl contour chest with a few pops/squeaks on insp bilaterally without wheeze or cough on insp or exp maneuvers   CV:  RRR  no s3 or murmur or increase in P2, and no edema   ABD:  soft and nontender with nl inspiratory excursion in the supine position. No bruits or organomegaly appreciated,  bowel sounds nl  MS:  Nl gait/ ext warm without deformities, calf tenderness, cyanosis or clubbing No obvious joint restrictions   SKIN: warm and dry without lesions    NEURO:  alert, approp, nl sensorium with  no motor or cerebellar deficits apparent.    CXR PA and Lateral:   03/15/2020 :    I personally reviewed images and  impression as follows:   L lung less opacities/ R is about the same      Assessment   COPD GOLD I  Quit smoking 2012  - 03/15/2020  Very poor hfa but cough / throat irritation from BREO > try dulera 100 2bid     Main concern is cough which is not typical of copd or bronchiectasis and may just be from the upper airway effects of DPI > rec change to low dose ICS/LABA and regroup in 3 months, sooner if needed   Pt informed of the seriousness of COVID 19 infection as a direct risk to lung health  and safey and to close contacts and should continue to wear a facemask in public and minimize exposure to public locations but especially avoid any area or activity where non-close contacts are not observing distancing or wearing an appropriate face mask.  I strongly recommended vaccine asap and she said she would consider based on risk /benefit discussion today.      Multiple lung nodules on CT 08/18/17  MAI isolated from sputum, sensitive to Biaxin   This is an extremely common benign condition in the elderly and does not warrant aggressive eval/ rx at this point unless there is a clinical correlation suggesting unaddressed pulmonary infection (purulent sputum, night sweats, unintended wt loss, doe) or evolution of  obvious changes on plain cxr (as opposed to serial CT, which is way over sensitive to make clinical decisions re intervention and treatment in the elderly, who tend to tolerate both dx and treatment poorly) .   For now nothing that sounds like either typical bronchiectasis or copd cough so rx airways x 3 months then regroup with all meds in hand using a trust  but verify approach to confirm accurate Medication  Reconciliation The principal here is that until we are certain that the  patients are doing what we've asked, it makes no  sense to ask them to do more.       Each maintenance medication was reviewed in detail including emphasizing most importantly the difference between maintenance and prns and under what circumstances the prns are to be triggered using an action plan format where appropriate.  Total time for H and P, chart review, counseling, teaching device and generating customized AVS unique to this office visit / charting = 40 min with pt new to me          Christinia Gully, MD 03/15/2020

## 2020-03-15 NOTE — Assessment & Plan Note (Signed)
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   For now nothing that sounds like either typical bronchiectasis or copd cough so rx airways x 3 months then regroup with all meds in hand using a trust but verify approach to confirm accurate Medication  Reconciliation The principal here is that until we are certain that the  patients are doing what we've asked, it makes no sense to ask them to do more.           Each maintenance medication was reviewed in detail including emphasizing most importantly the difference between maintenance and prns and under what circumstances the prns are to be triggered using an action plan format where appropriate.  Total time for H and P, chart review, counseling, teaching device and generating customized AVS unique to this office visit / charting = 40 min with pt new to me

## 2020-03-16 NOTE — Progress Notes (Signed)
Attempted to call CXR result.  Pt. Has no VM set up.

## 2020-03-17 ENCOUNTER — Telehealth: Payer: Self-pay | Admitting: Internal Medicine

## 2020-03-17 NOTE — Telephone Encounter (Signed)
Spoke with pt and advised that per ov note from Dr Melvyn Novas on 03/15/2020 he advised pt to get either Pfizer or Maderna covid vaccine. Pt verbalized understanding. Nothing further needed.

## 2020-03-22 ENCOUNTER — Telehealth: Payer: Self-pay | Admitting: Internal Medicine

## 2020-03-22 NOTE — Telephone Encounter (Signed)
This is most likely due to your underlying bronchiectasis. Can send in a Z-Pak #1 take as directed with hold on that this and only take if symptoms worsen with discolored mucus that persist. Would take Mucinex twice daily as needed for cough and congestion if she has a flutter valve use flutter valve 2-3 times a day If symptoms or not improving or worsen please contact us for sooner follow-up or go to the emergency room

## 2020-03-22 NOTE — Telephone Encounter (Signed)
Spoke with the pt, who was seen by Dr Melvyn Novas 03/15/20  She states having cough with green sputum x 2 days  She states she typically does not cough up any sputum at all  She wonders if this is bc she has been spending more time outdoors   She was seen by Dr Melvyn Novas on 03/15/20 and he prescribed Dulera but she has not picked this up yet and has instead been using her albuterol bid  She states that she is using xyzal and flose also  She denies any fever (temp today 98.6) and no increased SOB or wheezing  She had recent cxr 03/15/20 and here are the results:   CLINICAL DATA:  Cough.  EXAM: CHEST - 2 VIEW  COMPARISON:  04/03/2019 CT chest.  11/08/2018 chest radiograph.  FINDINGS: Redemonstration of bilateral micronodular opacities demonstrating mid to lower lung predominance, more conspicuous within the right mid and left lower lung fields.  No pneumothorax or pleural effusion. Cardiomediastinal silhouette is unchanged. Sequela of ACDF.  IMPRESSION: Increased right mid and left lower lung micronodularity is concerning for interval progression of chronic disease. However cannot exclude superimposed acute infectious/inflammatory process.   Electronically Signed   By: Primitivo Gauze M.D.   On: 03/15/2020 19:10  Please advise any recs thanks!

## 2020-03-22 NOTE — Telephone Encounter (Signed)
Called and spoke with pt letting her know that TP said she needed to be seen and pt stated she has not coughed up any mucus today so wants to hold off on scheduling an appt at this time. Pt stated if she began to cough up more discolored phlegm, she would then call us for an appt. Nothing further needed.

## 2020-03-22 NOTE — Progress Notes (Signed)
Pt notified of results

## 2020-03-22 NOTE — Telephone Encounter (Signed)
Per TP- needs appt to be seen

## 2020-03-22 NOTE — Telephone Encounter (Signed)
Spoke with patient. She is aware of TP's recs. She declined the zpak because she just finished a 10 day round of antibiotics. She could not remember the name. After searching for the antibiotic in her purse, she stated that it was Keflex 500mg . She stated that Dr. Luan Pulling had called it in for her last year with several refills. She does not want to use the antibiotic because they have not helped in the past.   I called [facility] to see if she has filled any antibiotics within the last month. The last antibiotic refill was back in October 2020 for Keflex 500mg .

## 2020-03-24 ENCOUNTER — Telehealth: Payer: Self-pay | Admitting: Adult Health

## 2020-03-24 ENCOUNTER — Encounter: Payer: Self-pay | Admitting: Adult Health

## 2020-03-24 ENCOUNTER — Ambulatory Visit (INDEPENDENT_AMBULATORY_CARE_PROVIDER_SITE_OTHER): Payer: Medicare Other | Admitting: Adult Health

## 2020-03-24 ENCOUNTER — Other Ambulatory Visit: Payer: Self-pay

## 2020-03-24 DIAGNOSIS — J479 Bronchiectasis, uncomplicated: Secondary | ICD-10-CM

## 2020-03-24 DIAGNOSIS — J449 Chronic obstructive pulmonary disease, unspecified: Secondary | ICD-10-CM | POA: Diagnosis not present

## 2020-03-24 MED ORDER — AZITHROMYCIN 250 MG PO TABS
ORAL_TABLET | ORAL | 0 refills | Status: AC
Start: 2020-03-24 — End: 2020-03-29

## 2020-03-24 NOTE — Patient Instructions (Signed)
Z-Pak take as directed Add Mucinex twice daily as needed for cough congestion Use flutter valve 2-3 times a day Use Dulera 2 puffs every 12 hours, rinse after use Follow-up with Dr. Work in 3 months and as needed Please contact office for sooner follow up if symptoms do not improve or worsen or seek emergency care

## 2020-03-24 NOTE — Telephone Encounter (Signed)
Spoke with pt and clarified that [facility] was 2 puffs in am then 2 puffs 12 hrs later and rinse mouth after each use. Pt verbalized understanding. Nothing further needed.

## 2020-03-24 NOTE — Progress Notes (Signed)
Virtual Visit via Telephone Note  I connected with Melody [name] on 03/24/20 at 11:30 AM EDT by telephone and verified that I am speaking with the correct person using two identifiers.  Location: Patient: Home  Provider: Home Office    I discussed the limitations, risks, security and privacy concerns of performing an evaluation and management service by telephone and the availability of in person appointments. I also discussed with the patient that there may be a patient responsible charge related to this service. The patient expressed understanding and agreed to proceed.   History of Present Illness: A [age]-year-old female former smoker followed for mild COPD and bronchiectasis with MAC colonization  Today's televisit is an acute office visit.  Patient complains of 2-3 days of increased cough, congestion with thick green mucus.  Says that she feels like she got this from being outside so much lately.  She has flutter valve but has not been using it much. Started [facility] yesterday ,says she took it twice yesterday (read the instructions "every 2hr" .  She denies any fever, chest pain, orthopnea, edema.  Has chronic rhinitis Xyzal and Flonase .  No antibiotics since 07/2019.    Observations/Objective: 04/04/17 IgG: 1066 IgA: 135 IgM: 153 IgE: 15 RAST panel: Negative ANA: 1:80 RF: <14 Anti-CCP: <16 SSA: <1.0 SSB: <1.0    08/17/16 ANA: Negative Rheumatoid factor: <14  03/24/2020 speaks in full sentences with no audible distress.  No audible wheezing.   Assessment and Plan: COPD/bronchiectatic flare.  Will treat with short course of prednisone.  Increase pulmonary hygiene.  Would recommend Mucinex and flutter valve.  Also continue on Dulera.  Discussed correct dosing as 2 puffs every 12 hours.  Plan  Patient Instructions  Z-Pak take as directed Add Mucinex twice daily as needed for cough congestion Use flutter valve 2-3 times a day Use Dulera 2 puffs every 12  hours, rinse after use Follow-up with Dr. Work in 3 months and as needed Please contact office for sooner follow up if symptoms do not improve or worsen or seek emergency care       Follow Up Instructions:    I discussed the assessment and treatment plan with the patient. The patient was provided an opportunity to ask questions and all were answered. The patient agreed with the plan and demonstrated an understanding of the instructions.   The patient was advised to call back or seek an in-person evaluation if the symptoms worsen or if the condition fails to improve as anticipated.  I provided 22  minutes of non-face-to-face time during this encounter.   Rexene Edison, NP

## 2020-03-31 ENCOUNTER — Ambulatory Visit (INDEPENDENT_AMBULATORY_CARE_PROVIDER_SITE_OTHER): Payer: Medicare Other | Admitting: Internal Medicine

## 2020-03-31 ENCOUNTER — Encounter: Payer: Self-pay | Admitting: Internal Medicine

## 2020-03-31 ENCOUNTER — Telehealth: Payer: Self-pay

## 2020-03-31 ENCOUNTER — Other Ambulatory Visit: Payer: Self-pay

## 2020-03-31 DIAGNOSIS — A310 Pulmonary mycobacterial infection: Secondary | ICD-10-CM | POA: Diagnosis not present

## 2020-03-31 DIAGNOSIS — L600 Ingrowing nail: Secondary | ICD-10-CM | POA: Diagnosis not present

## 2020-03-31 DIAGNOSIS — J449 Chronic obstructive pulmonary disease, unspecified: Secondary | ICD-10-CM

## 2020-03-31 DIAGNOSIS — E559 Vitamin D deficiency, unspecified: Secondary | ICD-10-CM

## 2020-03-31 DIAGNOSIS — F319 Bipolar disorder, unspecified: Secondary | ICD-10-CM

## 2020-03-31 DIAGNOSIS — E538 Deficiency of other specified B group vitamins: Secondary | ICD-10-CM

## 2020-03-31 MED ORDER — DULERA 100-5 MCG/ACT IN AERO: 2.0000 | INHALATION_SPRAY | Freq: Two times a day (BID) | RESPIRATORY_TRACT | 11 refills | Status: DC

## 2020-03-31 MED ORDER — CICLOPIROX 8 % EX SOLN: Freq: Every day | CUTANEOUS | 0 refills | Status: DC

## 2020-03-31 MED ORDER — OXYCODONE-ACETAMINOPHEN 5-325 MG PO TABS: 1.0000 | ORAL_TABLET | Freq: Three times a day (TID) | ORAL | 0 refills | Status: DC | PRN

## 2020-03-31 NOTE — Telephone Encounter (Signed)
New message    Seen 6.3.21   The patient on antibiotics and was advised by Dr. Alain Marion to get the COVID vaccine.   The patient did not mention to Dr.Plotnikov when she was in the office, asking for a callback today to discuss her options.

## 2020-03-31 NOTE — Assessment & Plan Note (Signed)
Ref to Dr Melody [name] Rx for onychomycosis

## 2020-03-31 NOTE — Assessment & Plan Note (Signed)
On B12 
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
## 2020-03-31 NOTE — Assessment & Plan Note (Signed)
F/u w/dr Wert °

## 2020-03-31 NOTE — Assessment & Plan Note (Signed)
Vit d 
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
## 2020-03-31 NOTE — Assessment & Plan Note (Signed)
Advised to get COVID19 vaccine ASAP

## 2020-04-02 NOTE — Telephone Encounter (Signed)
Melody [name] need to schedule her vaccine appointment after she is finished with the antibiotic.  Thanks

## 2020-04-05 NOTE — Telephone Encounter (Signed)
Pt informed of below. She states she had her first dose on 04/04/20.

## 2020-04-18 ENCOUNTER — Ambulatory Visit (INDEPENDENT_AMBULATORY_CARE_PROVIDER_SITE_OTHER): Payer: Medicare Other | Admitting: Podiatry

## 2020-04-18 ENCOUNTER — Other Ambulatory Visit: Payer: Self-pay

## 2020-04-18 DIAGNOSIS — M7752 Other enthesopathy of left foot: Secondary | ICD-10-CM

## 2020-04-18 DIAGNOSIS — L600 Ingrowing nail: Secondary | ICD-10-CM | POA: Diagnosis not present

## 2020-04-18 DIAGNOSIS — B351 Tinea unguium: Secondary | ICD-10-CM

## 2020-04-18 MED ORDER — TERBINAFINE HCL 250 MG PO TABS: 250.0000 mg | ORAL_TABLET | Freq: Every day | ORAL | 0 refills | Status: DC

## 2020-04-18 NOTE — Progress Notes (Signed)
Subjective: Patient presents today for evaluation of pain to the medial border of the left great toe. Patient is concerned for possible ingrown nail. Patient presents today for further treatment and evaluation.  She does have a history of partial permanent nail avulsions to the right hallux with good results. Patient also states that she has pain to the plantar aspect of the left fourth toe especially with walking.  She states that after walking for half a mile she is unable to walk due to the pain despite good conservative shoe gear. Finally the patient states that she has had a discolored nail to the left hallux nail plate for several months now.  It is discolored and thickened and she is concerned for possible toenail fungal infection.  She has seen her primary care physician who prescribed a topical antifungal which did not help.  Past Medical History:  Diagnosis Date  . Anxiety   . Bronchiectasis ([place])   . Chronic back pain   . Chronic neck pain   . Depression    bipolar- Dr [name]  . Fatigue   . Fibromyalgia   . GERD (gastroesophageal reflux disease)   . Headache   . Hypothyroidism   . Low back pain    Dr Arnoldo Morale  . 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([place])   . Osteoarthritis   . Pneumonia   . Pulmonary nodule   . Sinus congestion   . Thyroid disease    hypothyroidsm  . Vertigo   . Vitamin B12 deficiency     Objective:  General: Well developed, nourished, in no acute distress, alert and oriented x3   Dermatology: Skin is warm, dry and supple bilateral.  Medial border left hallux appears to be erythematous with evidence of an ingrowing nail. Pain on palpation noted to the border of the nail fold. The remaining nails appear unremarkable at this time. There are no open sores, lesions. Dystrophic, hyperkeratotic, discolored nail noted to the lateral aspect of the left hallux nail plate.  Vascular: Dorsalis Pedis artery and Posterior Tibial artery pedal pulses palpable.  No lower extremity edema noted.   Neruologic: Grossly intact via Mcculley touch bilateral.  Musculoskeletal: Muscular strength within normal limits in all groups bilateral. Normal range of motion noted to all pedal and ankle joints.  There is also pain on palpation to the PIPJ and IPJ of the left fourth toe plantarly.  Assesement: #1 Paronychia with ingrowing nail medial border left hallux #2  Capsulitis left fourth digit plantar 3.  Onychomycosis left hallux nail plate  Plan of Care:  1. Patient evaluated.  2. Discussed treatment alternatives and plan of care. Explained nail avulsion procedure and post procedure course to patient. 3. Patient opted for permanent partial nail avulsion of the medial border left hallux.  4. Prior to procedure, local anesthesia infiltration utilized using 3 ml of a 50:50 mixture of 2% plain lidocaine and 0.5% plain marcaine in a normal hallux block fashion and a betadine prep performed.  5. Partial permanent nail avulsion with chemical matrixectomy performed using [id number] applications of phenol followed by alcohol flush.  6. Jemmott dressing applied. 7.  Triple antibiotic ointment provided 8.  Injection of 0.5 cc Celestone Soluspan injection to the plantar aspect of the left fourth toe to address the toe capsulitis.  Continue good conservative shoes 9.  Prescription for Lamisil turned 50 mg #90.  Patient denies liver pathology.  Liver function panel performed on 12/30/2019 within normal limits. 10.  Return to clinic in 2 weeks  Edrick Kins, DPM [facility]  Dr. Edrick Kins, [address]                                        [place], [place] [address]                Office [phone]  Fax [phone]

## 2020-04-18 NOTE — Patient Instructions (Signed)

## 2020-05-09 ENCOUNTER — Other Ambulatory Visit: Payer: Self-pay

## 2020-05-09 ENCOUNTER — Ambulatory Visit (INDEPENDENT_AMBULATORY_CARE_PROVIDER_SITE_OTHER): Payer: Medicare Other | Admitting: Podiatry

## 2020-05-09 VITALS — Temp 96.0°F

## 2020-05-09 DIAGNOSIS — M7752 Other enthesopathy of left foot: Secondary | ICD-10-CM | POA: Diagnosis not present

## 2020-05-09 DIAGNOSIS — M7989 Other specified soft tissue disorders: Secondary | ICD-10-CM

## 2020-05-09 DIAGNOSIS — L600 Ingrowing nail: Secondary | ICD-10-CM | POA: Diagnosis not present

## 2020-05-10 ENCOUNTER — Other Ambulatory Visit: Payer: Self-pay | Admitting: Internal Medicine

## 2020-05-10 NOTE — Progress Notes (Signed)
   Subjective: 64 y.o. female presents today status post permanent nail avulsion procedure of the medial border of the left great toe that was performed on 04/18/2020.  Patient states that the toe is doing much better.  She continues to have pain and sensitivity to the left fourth toe however despite anti-inflammatory injection.  She continues to experience pain with walking to the left fourth toe.  Today she is more concerned regarding the left fourth toe and she states that she feels there is a mass on the plantar aspect of the toe.  It is very painful and symptomatic.Marland Kitchen   Past Medical History:  Diagnosis Date  . Anxiety   . Bronchiectasis ([place])   . Chronic back pain   . Chronic neck pain   . Depression    bipolar- Dr [name]  . Fatigue   . Fibromyalgia   . GERD (gastroesophageal reflux disease)   . Headache   . Hypothyroidism   . Low back pain    Dr Arnoldo Morale  . 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([place])   . Osteoarthritis   . Pneumonia   . Pulmonary nodule   . Sinus congestion   . Thyroid disease    hypothyroidsm  . Vertigo   . Vitamin B12 deficiency     Objective: Skin is warm, dry and supple. Nail and respective nail fold appears to be healing appropriately. Open wound to the associated nail fold with a granular wound base and moderate amount of fibrotic tissue. Minimal drainage noted. Mild erythema around the periungual region likely due to phenol chemical matricectomy.  There continues to be significant pain on palpation and range of motion to the left fourth toe.  With palpation there is a nodule on the plantar aspect of the toe.  This nodule appears to be nonadhered and approximately 0.5 cm in diameter.  It is very symptomatic to palpation  Assessment: #1 postop permanent partial nail avulsion left great toe medial border #2 open wound periungual nail fold of respective digit.  #3 soft tissue mass plantar aspect of the left fourth toe  Plan of care: #1 patient was  evaluated  #2 debridement of open wound was performed to the periungual border of the respective toe using a currette. Antibiotic ointment and Band-Aid was applied. #3  Today were going to order an MRI of the left fourth toe to rule out any soft tissue pathology.  Patient states the toe is incredibly painful despite conservative treatment.  She is dealing with this pain for several months now with no alleviation of symptoms.  She feels as though there is a lump or mass directly underneath the toe.  Order placed for MRI today #4 return to clinic in 3 weeks to review MRI results and discuss different treatment options   Edrick Kins, DPM [facility]  Dr. Edrick Kins, [address]                                        [place], [place] [address]                Office [phone]  Fax [phone]

## 2020-05-12 ENCOUNTER — Telehealth: Payer: Self-pay | Admitting: *Deleted

## 2020-05-12 DIAGNOSIS — M7989 Other specified soft tissue disorders: Secondary | ICD-10-CM

## 2020-05-12 DIAGNOSIS — M7752 Other enthesopathy of left foot: Secondary | ICD-10-CM

## 2020-05-12 NOTE — Telephone Encounter (Signed)
-----   Message from Edrick Kins, DPM sent at 05/10/2020  1:20 PM EDT ----- Regarding: MRI left fourth toe Please order MRI left forefoot without contrast  Diagnosis: Soft tissue mass left fourth toe  Thanks, Dr. Amalia Hailey

## 2020-05-13 NOTE — Telephone Encounter (Signed)
Faxed orders, clinicals and demographics to [facility] for pre-cert and scheduling. 

## 2020-05-24 ENCOUNTER — Telehealth: Payer: Self-pay | Admitting: *Deleted

## 2020-05-24 NOTE — Telephone Encounter (Signed)
Pt states she received a call from [facility] to have the MRI at their facility and she had told Dr. Amalia Hailey she had wanted the MRI at [facility].

## 2020-06-06 ENCOUNTER — Other Ambulatory Visit: Payer: Self-pay | Admitting: Internal Medicine

## 2020-06-17 ENCOUNTER — Ambulatory Visit: Payer: Medicare Other | Admitting: Internal Medicine

## 2020-06-20 ENCOUNTER — Encounter: Payer: Self-pay | Admitting: Internal Medicine

## 2020-06-20 ENCOUNTER — Ambulatory Visit (INDEPENDENT_AMBULATORY_CARE_PROVIDER_SITE_OTHER): Payer: Medicare Other | Admitting: Internal Medicine

## 2020-06-20 ENCOUNTER — Other Ambulatory Visit: Payer: Self-pay

## 2020-06-20 DIAGNOSIS — J449 Chronic obstructive pulmonary disease, unspecified: Secondary | ICD-10-CM

## 2020-06-20 DIAGNOSIS — R918 Other nonspecific abnormal finding of lung field: Secondary | ICD-10-CM

## 2020-06-20 MED ORDER — AZITHROMYCIN 250 MG PO TABS
ORAL_TABLET | ORAL | 11 refills | Status: DC
Start: 2020-06-20 — End: 2020-07-06

## 2020-06-20 NOTE — Assessment & Plan Note (Addendum)
See CT chest 07/22/17 -  08/18/17  MAI isolated from sputum, sensitive to Biaxin  > 06/20/2020 rec zpak prn purulent sputum  She has minimal flare in purulent bronchitis > rx zpak prn          Each maintenance medication was reviewed in detail including emphasizing most importantly the difference between maintenance and prns and under what circumstances the prns are to be triggered using an action plan format where appropriate.  Total time for H and P, chart review, counseling, teaching device and generating customized AVS unique to this office visit / charting = 35 min

## 2020-06-20 NOTE — Progress Notes (Signed)
Melody [name], female    DOB: 18-Nov-1955,     MRN: [id number]   Brief patient profile:  84 yowf quit smoking  04/2011  prev followed by [facility] then [place] with dx of bronchiectasis/ MAC colonization with GOLD I criteria for copd 04/2017 self referred to pulmonary clinic in [place]  03/15/2020      LABS 04/04/17 IgG: 1066 IgA: 135 IgM: 153 IgE: 15 RAST panel: Negative ANA:  1:80 RF:  <14 Anti-CCP:  <16 SSA:  <1.0 SSB:  <1.0    08/17/16 ANA: Negative Rheumatoid factor:  <14    Admit date: 11/07/2018 Discharge date: 11/10/2018   Brief Hospitalization Summary: Please see all hospital notes, images, labs for full details of the hospitalization. [name] a62 y.o.female,with history of bronchiectasis, depression, GERD, hypothyroidism, MAI came to hospital with complaint of shortness of breath, cough. Patient was recently discharged from the hospital on 10/02/2018. Patient says that she has experienced worsening shortness of breath, coughing up phlegm.   In the ED chest x-ray showed diffusely increased interstitial opacity with mild nodularity on the right suspect for acute interstitial inflammatory infectious process superimposed on underlying chronic changes Patient started on IV Levaquin.  Brief Admission Hx: 64 y.o.female,with history of bronchiectasis, depression, GERD, hypothyroidism, MAI came to hospital with complaint of shortness of breath, cough.  MDM/Assessment & Plan:  1. Healthcare associated pneumonia-given that she was hospitalized last month, seen on chest x-ray, patient started on IV Levaquin. She is clinically improving and feeling better, ambulating in the room.Continue levofloxacin.  She is on room air.   Blood culture no growth to date. Influenza panel is negative  2. Bronchiectasis/COPD-treated with DuoNebs every 6 hours, Solu-Medrol 60 mg IV every 6 hours, Mucinex 1200 mg p.o. twice daily. Add cough medication.  Discharged home on  a prednisone taper.  3. Hypokalemia-repleted.  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  Because of difficulty with transportation, the patient has requested to follow-up and establish [name] with Dr. Luan Pulling so that she can be seen by a local pulmonologist.  She understands that he is retiring at the end of the year but another pulmonologist is going to be taking over that practice.  5. Hypothyroidism-continue Synthroid   6  Bipolar Disorder - Resumed home medications.   History of Present Illness  03/15/2020  Pulmonary/ 1st office eval/Melody [name] / Melody [name]  Chief Complaint  Patient presents with  . Pulmonary Consult    Former pt of Dr Luan Pulling. She c/o occ non prod cough.    Living in same house since around 2008 with neg allergy profile for mold as above   Dyspnea:  MMRC2 = can't walk a nl pace on a flat grade s sob but does fine slow and flat   Cough: some worse p mowed grass one week prior to OV on Breo which "makes her cough" like something tickling her throat    Sleep: great p sleep med per Melody [name] s resp complaints SABA use: one daily/ rarely  rec Plan A = Automatic = Always=   Stop Breo (powder) Dulera 100 Take 2 puffs first thing in am and then another 2 puffs about 12 hours later.  Work on inhaler technique:  Plan B = Backup (to supplement plan A, not to replace it) Only use your albuterol inhaler as a rescue medication Plan C = Crisis (instead of Plan B but only if Plan B stops working) - only use your albuterol nebulizer if you first try Plan B  and it fails to help > ok to use the nebulizer up to every 4 hours but if start needing it regularly call for immediate appointment Please schedule a follow up visit in 3 months but call sooner if needed - please bring inhalers and nebulizer solution    06/20/2020  f/u ov/[place] office/Melody [name] re:  Copd I/ bronchiectasis better on dulera  100 2bid / had covid vaccines x 2 / did not bring inhalers Chief  Complaint  Patient presents with  . Follow-up    Pt c/o cough with green sputum off and on for the past few months.    Dyspnea:  MMRC2 = can't walk a nl pace on a flat grade s sob but does fine slow and flat  Cough: variably purulent esp in am / does not have prn abx Sleeping: flat in bed/ on one pillow  SABA use: hasn't needed at all "you told me to stop these"  (note Plan B / C above)  02: none    No obvious patterns  day to day or daytime variability or assoc   mucus plugs or hemoptysis or cp or chest tightness, subjective wheeze or overt sinus or hb symptoms.   Sleep without nocturnal  or early am exacerbation  of respiratory  c/o's or need for noct saba. Also denies any obvious fluctuation of symptoms with weather or environmental changes or other aggravating or alleviating factors except as outlined above   No unusual exposure hx or h/o childhood pna/ asthma or knowledge of premature birth.  Current Allergies, Complete Past Medical History, Past Surgical History, Family History, and Social History were reviewed in [employer] record.  ROS  The following are not active complaints unless bolded Hoarseness, sore throat, dysphagia, dental problems, itching, sneezing,  nasal congestion or discharge of excess mucus or purulent secretions, ear ache,   fever, chills, sweats, unintended wt loss or wt gain, classically pleuritic or exertional cp,  orthopnea pnd or arm/hand swelling  or leg swelling, presyncope, palpitations, abdominal pain, anorexia, nausea, vomiting, diarrhea  or change in bowel habits or change in bladder habits, change in stools or change in urine, dysuria, hematuria,  rash, arthralgias, visual complaints, headache, numbness, weakness or ataxia or problems with walking or coordination,  change in mood or  memory.        Current Meds  Medication Sig  . albuterol (VENTOLIN HFA) 108 (90 Base) MCG/ACT inhaler INHALE TWO PUFFS INTO LUNGS EVERY 6 HOURS AS  NEEDED FOR WHEEZING OR SHORTNESS OF BREATH  . ALPRAZolam (XANAX) 1 MG tablet Take 1 mg by mouth 4 (four) times daily.   . Azelastine-Fluticasone (DYMISTA) 137-50 MCG/ACT SUSP Place 1 spray 2 (two) times daily into the nose.  . Cholecalciferol (VITAMIN D3) 1.25 MG (50000 UT) CAPS Take 1 capsule by mouth every 14 (fourteen) days.  . ciclopirox (PENLAC) 8 % solution Apply topically at bedtime. Apply over nail and surrounding skin. Apply daily over previous coat. After seven (7) days, may remove with alcohol and continue cycle.  . cyanocobalamin (,VITAMIN B-12,) 1000 MCG/ML injection INJECT 1 ML SQ EVERY 14 DAYS.  . fluticasone (FLONASE) 50 MCG/ACT nasal spray Place 2 sprays into both nostrils daily.  Marland Kitchen levocetirizine (XYZAL) 5 MG tablet Take 1 tablet (5 mg total) by mouth every evening.  Marland Kitchen levothyroxine (SYNTHROID) 50 MCG tablet Take 1 tablet (50 mcg total) by mouth daily before breakfast.  . methylphenidate (RITALIN) 20 MG tablet Take 1 tablet (20 mg total) by mouth  daily.  . mometasone-formoterol (DULERA) 100-5 MCG/ACT AERO Inhale 2 puffs into the lungs in the morning and at bedtime. Take 2 puffs first thing in am and then another 2 puffs about 12 hours later.  Marland Kitchen oxyCODONE-acetaminophen (PERCOCET/ROXICET) 5-325 MG tablet TAKE 1 TABLET BY MOUTH EVERY 8 HOURS prrn FOR SEVERE pain FOR pain  . pantoprazole (PROTONIX) 40 MG tablet Take 1 tablet (40 mg total) by mouth daily.  . QUEtiapine (SEROQUEL) 25 MG tablet Take 25 mg by mouth at bedtime.   Marland Kitchen Respiratory Therapy Supplies (FLUTTER) DEVI Use as directed  . SUMAtriptan (IMITREX) 100 MG tablet TAKE 1 TABLET BY MOUTH EVERY 2 HOURS AS NEEDED FOR MIGRAINE - MAY REPEAT in TWO hours if HEADACHE persists OR recurs  . terbinafine (LAMISIL) 250 MG tablet Take 1 tablet (250 mg total) by mouth daily.                Past Medical History:  Diagnosis Date  . Anxiety   . Bronchiectasis ([place])   . Chronic back pain   . Chronic neck pain   . Depression     bipolar- Dr Melody [name]  . Fatigue   . Fibromyalgia   . GERD (gastroesophageal reflux disease)   . Headache   . Hypothyroidism   . Low back pain    Dr Arnoldo Morale  . 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([place])   . Osteoarthritis   . Pneumonia   . Pulmonary nodule   . Sinus congestion   . Thyroid disease    hypothyroidsm  . Vertigo   . Vitamin B12 deficiency        Objective:    Somber amb wf nad  Wt Readings from Last 3 Encounters:  06/20/20 129 lb (58.5 kg)  03/31/20 129 lb (58.5 kg)  03/15/20 130 lb (59 kg)     Vital signs reviewed - Note on arrival 06/20/2020  02 sats  100% on RA       HEENT : pt wearing mask not removed for exam due to covid - 19 concerns.   NECK :  without JVD/Nodes/TM/ nl carotid upstrokes bilaterally   LUNGS: no acc muscle use,  Min barrel  contour chest wall with bilateral  slightly decreased bs s audible wheeze and  without cough on insp or exp maneuvers and min  Hyperresonant  to  percussion bilaterally     CV:  RRR  no s3 or murmur or increase in P2, and no edema   ABD:  soft and nontender with pos end  insp Hoover's  in the supine position. No bruits or organomegaly appreciated, bowel sounds nl  MS:   Nl gait/  ext warm without deformities, calf tenderness, cyanosis or clubbing No obvious joint restrictions   SKIN: warm and dry without lesions    NEURO:  alert, approp, nl sensorium with  no motor or cerebellar deficits apparent.             Assessment

## 2020-06-20 NOTE — Patient Instructions (Addendum)
For nasty mucus  >  zpak   Plan A = Automatic = Always=    dulera 100 Take 2 puffs first thing in am and then another 2 puffs about 12 hours later.     Plan B = Backup (to supplement plan A, not to replace it) Only use your albuterol inhaler as a rescue medication to be used if you can't catch your breath by resting or doing a relaxed purse lip breathing pattern.  - The less you use it, the better it will work when you need it. - Ok to use the inhaler up to 2 puffs  every 4 hours if you must but call for appointment if use goes up over your usual need - Don't leave home without it !!  (think of it like the spare tire for your car)   Plan C = Crisis (instead of Plan B but only if Plan B stops working) - only use your albuterol nebulizer if you first try Plan B and it fails to help > ok to use the nebulizer up to every 4 hours but if start needing it regularly call for immediate appointment   Please schedule a follow up visit in 6 months but call sooner if needed

## 2020-06-20 NOTE — Assessment & Plan Note (Addendum)
Quit smoking 2012  - 03/15/2020  Very poor hfa but cough / throat irritation from BREO > try dulera 100 2bid   > improved   Mild ab component well controlled on present rx   - The proper method of use, as well as anticipated side effects, of a metered-dose inhaler are discussed and demonstrated to the patient. Improved effectiveness after extensive coaching during this visit to a level of approximately 75%

## 2020-07-06 ENCOUNTER — Other Ambulatory Visit: Payer: Self-pay

## 2020-07-06 ENCOUNTER — Ambulatory Visit (INDEPENDENT_AMBULATORY_CARE_PROVIDER_SITE_OTHER): Payer: Medicare Other | Admitting: Internal Medicine

## 2020-07-06 ENCOUNTER — Encounter: Payer: Self-pay | Admitting: Internal Medicine

## 2020-07-06 DIAGNOSIS — J449 Chronic obstructive pulmonary disease, unspecified: Secondary | ICD-10-CM

## 2020-07-06 DIAGNOSIS — E034 Atrophy of thyroid (acquired): Secondary | ICD-10-CM

## 2020-07-06 DIAGNOSIS — G8929 Other chronic pain: Secondary | ICD-10-CM

## 2020-07-06 DIAGNOSIS — M5440 Lumbago with sciatica, unspecified side: Secondary | ICD-10-CM | POA: Diagnosis not present

## 2020-07-06 DIAGNOSIS — F439 Reaction to severe stress, unspecified: Secondary | ICD-10-CM | POA: Diagnosis not present

## 2020-07-06 LAB — CBC WITH DIFFERENTIAL/PLATELET
Absolute Monocytes: 296 cells/uL (ref 200–950)
Basophils Absolute: 32 cells/uL (ref 0–200)
Basophils Relative: 0.5 %
Eosinophils Absolute: 57 cells/uL (ref 15–500)
Eosinophils Relative: 0.9 %
HCT: 40.7 % (ref 35.0–45.0)
Hemoglobin: 13.5 g/dL (ref 11.7–15.5)
Lymphs Abs: 1134 cells/uL (ref 850–3900)
MCH: 29 pg (ref 27.0–33.0)
MCHC: 33.2 g/dL (ref 32.0–36.0)
MCV: 87.5 fL (ref 80.0–100.0)
MPV: 11.3 fL (ref 7.5–12.5)
Monocytes Relative: 4.7 %
Neutro Abs: 4782 cells/uL (ref 1500–7800)
Neutrophils Relative %: 75.9 %
Platelets: 168 10*3/uL (ref 140–400)
RBC: 4.65 10*6/uL (ref 3.80–5.10)
RDW: 13 % (ref 11.0–15.0)
Total Lymphocyte: 18 %
WBC: 6.3 10*3/uL (ref 3.8–10.8)

## 2020-07-06 LAB — TSH: TSH: 3.21 mIU/L (ref 0.40–4.50)

## 2020-07-06 MED ORDER — OXYCODONE-ACETAMINOPHEN 5-325 MG PO TABS: 1.0000 | ORAL_TABLET | Freq: Three times a day (TID) | ORAL | 0 refills | Status: DC | PRN

## 2020-07-06 NOTE — Progress Notes (Signed)
Subjective:  Patient ID: Melody [name], female    DOB: 01/26/1956  Age: 64 y.o. MRN: [id number]  CC: No chief complaint on file.   HPI SHAMEKIA TIPPETS presents for LBP, stress, HAs C/o stress at home w/son Dr Melvyn Novas gave a Z pac for congestion C/o poor appetite  Outpatient Medications Prior to Visit  Medication Sig Dispense Refill  . albuterol (VENTOLIN HFA) 108 (90 Base) MCG/ACT inhaler INHALE TWO PUFFS INTO LUNGS EVERY 6 HOURS AS NEEDED FOR WHEEZING OR SHORTNESS OF BREATH 8.5 g 5  . ALPRAZolam (XANAX) 1 MG tablet Take 1 mg by mouth 4 (four) times daily.     . Azelastine-Fluticasone (DYMISTA) 137-50 MCG/ACT SUSP Place 1 spray 2 (two) times daily into the nose. 1 Bottle 3  . azithromycin (ZITHROMAX) 250 MG tablet Take 2 on day one then 1 daily x 4 days 6 tablet 11  . Cholecalciferol (VITAMIN D3) 1.25 MG (50000 UT) CAPS Take 1 capsule by mouth every 14 (fourteen) days. 6 capsule 3  . ciclopirox (PENLAC) 8 % solution Apply topically at bedtime. Apply over nail and surrounding skin. Apply daily over previous coat. After seven (7) days, may remove with alcohol and continue cycle. 6.6 mL 0  . cyanocobalamin (,VITAMIN B-12,) 1000 MCG/ML injection INJECT 1 ML SQ EVERY 14 DAYS. 6 mL 3  . fluticasone (FLONASE) 50 MCG/ACT nasal spray Place 2 sprays into both nostrils daily. 48 g 3  . levocetirizine (XYZAL) 5 MG tablet Take 1 tablet (5 mg total) by mouth every evening. 90 tablet 3  . levothyroxine (SYNTHROID) 50 MCG tablet Take 1 tablet (50 mcg total) by mouth daily before breakfast. 90 tablet 3  . methylphenidate (RITALIN) 20 MG tablet Take 1 tablet (20 mg total) by mouth daily.  0  . mometasone-formoterol (DULERA) 100-5 MCG/ACT AERO Inhale 2 puffs into the lungs in the morning and at bedtime. Take 2 puffs first thing in am and then another 2 puffs about 12 hours later. 13 g 11  . oxyCODONE-acetaminophen (PERCOCET/ROXICET) 5-325 MG tablet TAKE 1 TABLET BY MOUTH EVERY 8 HOURS prrn FOR SEVERE pain FOR  pain 90 tablet 0  . pantoprazole (PROTONIX) 40 MG tablet Take 1 tablet (40 mg total) by mouth daily. 90 tablet 3  . QUEtiapine (SEROQUEL) 25 MG tablet Take 25 mg by mouth at bedtime.     Marland Kitchen Respiratory Therapy Supplies (FLUTTER) DEVI Use as directed 1 each 0  . SUMAtriptan (IMITREX) 100 MG tablet TAKE 1 TABLET BY MOUTH EVERY 2 HOURS AS NEEDED FOR MIGRAINE - MAY REPEAT in TWO hours if HEADACHE persists OR recurs 10 tablet 2  . terbinafine (LAMISIL) 250 MG tablet Take 1 tablet (250 mg total) by mouth daily. 90 tablet 0   No facility-administered medications prior to visit.    ROS: Review of Systems  Constitutional: Positive for fatigue. Negative for activity change, appetite change, chills and unexpected weight change.  HENT: Negative for congestion, mouth sores and sinus pressure.   Eyes: Negative for visual disturbance.  Respiratory: Negative for cough and chest tightness.   Gastrointestinal: Negative for abdominal pain and nausea.  Genitourinary: Negative for difficulty urinating, frequency and vaginal pain.  Musculoskeletal: Positive for arthralgias and back pain. Negative for gait problem.  Skin: Negative for pallor and rash.  Neurological: Negative for dizziness, tremors, weakness, numbness and headaches.  Psychiatric/Behavioral: Positive for decreased concentration, dysphoric mood and sleep disturbance. Negative for agitation, confusion, hallucinations and suicidal ideas. The patient is nervous/anxious.  Objective:  BP 120/78 (BP Location: Left Arm, Patient Position: Sitting, Cuff Size: Normal)   Pulse 72   Temp 98.8 F (37.1 C) (Oral)   Ht 5\' 6"  (1.676 m)   Wt 129 lb (58.5 kg)   LMP 10/29/2006 (Approximate)   SpO2 97%   BMI 20.82 kg/m   BP Readings from Last 3 Encounters:  07/06/20 120/78  06/20/20 120/76  03/31/20 130/82    Wt Readings from Last 3 Encounters:  07/06/20 129 lb (58.5 kg)  06/20/20 129 lb (58.5 kg)  03/31/20 129 lb (58.5 kg)    Physical  Exam Constitutional:      General: She is not in acute distress.    Appearance: She is well-developed.  HENT:     Head: Normocephalic.     Right Ear: External ear normal.     Left Ear: External ear normal.     Nose: Nose normal.  Eyes:     General:        Right eye: No discharge.        Left eye: No discharge.     Conjunctiva/sclera: Conjunctivae normal.     Pupils: Pupils are equal, round, and reactive to Reali.  Neck:     Thyroid: No thyromegaly.     Vascular: No JVD.     Trachea: No tracheal deviation.  Cardiovascular:     Rate and Rhythm: Normal rate and regular rhythm.     Heart sounds: Normal heart sounds.  Pulmonary:     Effort: No respiratory distress.     Breath sounds: No stridor. No wheezing.  Abdominal:     General: Bowel sounds are normal. There is no distension.     Palpations: Abdomen is soft. There is no mass.     Tenderness: There is no abdominal tenderness. There is no guarding or rebound.  Musculoskeletal:        General: Tenderness present.     Cervical back: Normal range of motion and neck supple.  Lymphadenopathy:     Cervical: No cervical adenopathy.  Skin:    Findings: No erythema or rash.  Neurological:     Mental Status: She is oriented to person, place, and time.     Cranial Nerves: No cranial nerve deficit.     Motor: No abnormal muscle tone.     Coordination: Coordination normal.     Deep Tendon Reflexes: Reflexes normal.  Psychiatric:        Behavior: Behavior normal.        Thought Content: Thought content normal.        Judgment: Judgment normal.    LS w/pain   Lab Results  Component Value Date   WBC 6.9 12/30/2019   HGB 13.8 12/30/2019   HCT 40.6 12/30/2019   PLT 162.0 12/30/2019   GLUCOSE 83 12/30/2019   CHOL 180 05/16/2018   TRIG 67.0 05/16/2018   HDL 69.80 05/16/2018   LDLCALC 96 05/16/2018   ALT 9 12/30/2019   AST 14 12/30/2019   NA 141 12/30/2019   K 3.4 (L) 12/30/2019   CL 103 12/30/2019   CREATININE 0.71  12/30/2019   BUN 12 12/30/2019   CO2 33 (H) 12/30/2019   TSH 3.51 12/30/2019    DG Chest 2 View  Result Date: 03/15/2020 CLINICAL DATA:  Cough. EXAM: CHEST - 2 VIEW COMPARISON:  04/03/2019 CT chest.  11/08/2018 chest radiograph. FINDINGS: Redemonstration of bilateral micronodular opacities demonstrating mid to lower lung predominance, more conspicuous within the right mid and  left lower lung fields. No pneumothorax or pleural effusion. Cardiomediastinal silhouette is unchanged. Sequela of ACDF. IMPRESSION: Increased right mid and left lower lung micronodularity is concerning for interval progression of chronic disease. However cannot exclude superimposed acute infectious/inflammatory process. Electronically Signed   By: Primitivo Gauze M.D.   On: 03/15/2020 19:10    Assessment & Plan:   There are no diagnoses linked to this encounter.   No orders of the defined types were placed in this encounter.    Follow-up: No follow-ups on file.  Walker Kehr, MD

## 2020-07-06 NOTE — Assessment & Plan Note (Signed)
Finished a Z pac

## 2020-07-06 NOTE — Assessment & Plan Note (Signed)
Percocet prn  Potential benefits of a long term opioids use as well as potential risks (i.e. addiction risk, apnea etc) and complications (i.e. Somnolence, constipation and others) were explained to the patient and were aknowledged.  

## 2020-07-06 NOTE — Assessment & Plan Note (Signed)
Discussed  No change

## 2020-07-06 NOTE — Assessment & Plan Note (Signed)
On Levothroid 

## 2020-07-06 NOTE — Addendum Note (Signed)
Addended by: Cresenciano Lick on: 07/06/2020 02:41 PM   Modules accepted: Orders

## 2020-07-07 LAB — COMPLETE METABOLIC PANEL WITH GFR
AG Ratio: 1.8 (calc) (ref 1.0–2.5)
ALT: 7 U/L (ref 6–29)
AST: 12 U/L (ref 10–35)
Albumin: 4.2 g/dL (ref 3.6–5.1)
Alkaline phosphatase (APISO): 78 U/L (ref 37–153)
BUN: 9 mg/dL (ref 7–25)
CO2: 30 mmol/L (ref 20–32)
Calcium: 9.6 mg/dL (ref 8.6–10.4)
Chloride: 103 mmol/L (ref 98–110)
Creat: 0.91 mg/dL (ref 0.50–0.99)
GFR, Est African American: 77 mL/min/{1.73_m2} (ref >=60)
GFR, Est Non African American: 66 mL/min/{1.73_m2} (ref >=60)
Globulin: 2.4 g/dL (calc) (ref 1.9–3.7)
Glucose, Bld: 76 mg/dL (ref 65–99)
Potassium: 3.7 mmol/L (ref 3.5–5.3)
Sodium: 142 mmol/L (ref 135–146)
Total Bilirubin: 0.6 mg/dL (ref 0.2–1.2)
Total Protein: 6.6 g/dL (ref 6.1–8.1)

## 2020-07-12 ENCOUNTER — Telehealth: Payer: Self-pay

## 2020-07-12 NOTE — Telephone Encounter (Signed)
Pt notified of result note & states that she forgot to mention that she has been feeling nausea after eating x 3 weeks.  Denies vomiting.  Is requesting Phenergan.  Pharmacy verified.  Instructed pt that I will call her back with Dr Plotnikov's response.

## 2020-07-20 MED ORDER — PROMETHAZINE HCL 12.5 MG PO TABS: 12.5000 mg | ORAL_TABLET | Freq: Three times a day (TID) | ORAL | 0 refills | Status: DC | PRN

## 2020-07-20 NOTE — Telephone Encounter (Signed)
Ok Thx 

## 2020-07-20 NOTE — Addendum Note (Signed)
Addended by: Cassandria Anger on: 07/20/2020 10:00 PM   Modules accepted: Orders

## 2020-07-21 ENCOUNTER — Other Ambulatory Visit: Payer: Self-pay | Admitting: Internal Medicine

## 2020-07-21 NOTE — Telephone Encounter (Signed)
Pt aware of phenergan prescription.

## 2020-08-11 ENCOUNTER — Telehealth: Payer: Self-pay | Admitting: Internal Medicine

## 2020-08-11 MED ORDER — LEVOFLOXACIN 500 MG PO TABS: 500.0000 mg | ORAL_TABLET | Freq: Every day | ORAL | 0 refills | Status: DC

## 2020-08-11 NOTE — Telephone Encounter (Signed)
Try Levaquin 500 mg daily x 10 days then ov

## 2020-08-11 NOTE — Telephone Encounter (Signed)
Spoke with the pt  She is c/o increased cough over the past month  She states also has some increased SOB  Her cough is prod with green to yellow sputum  She states it's hard to produce and small amounts when she does  She took zpack on 06/21/20 and then again on 07/21/20 and still has same symptoms  She denies any f/c/s, body aches  Has had her covid vaccines  She states using her albuterol inhaler maybe once per wk and uses Dulera 2 puffs bid  Please advise, thanks  Last avs: Instructions  For nasty mucus  >  zpak    Plan A = Automatic = Always=    dulera 100 Take 2 puffs first thing in am and then another 2 puffs about 12 hours later.      Plan B = Backup (to supplement plan A, not to replace it) Only use your albuterol inhaler as a rescue medication to be used if you can't catch your breath by resting or doing a relaxed purse lip breathing pattern.  - The less you use it, the better it will work when you need it. - Ok to use the inhaler up to 2 puffs  every 4 hours if you must but call for appointment if use goes up over your usual need - Don't leave home without it !!  (think of it like the spare tire for your car)    Plan C = Crisis (instead of Plan B but only if Plan B stops working) - only use your albuterol nebulizer if you first try Plan B and it fails to help > ok to use the nebulizer up to every 4 hours but if start needing it regularly call for immediate appointment     Please schedule a follow up visit in 6 months but call sooner if needed

## 2020-08-11 NOTE — Telephone Encounter (Signed)
Called and spoke with pt letting her know that MW wanted [place] to send Rx for levaquin to pharmacy for her to take and she verbalized understanding.  Pt has also been scheduled for a f/u with MW 11/3 in [place] to reassess after abx. While speaking with pt, pt stated that she  Had someone at her house a few days ago and she found out that this person just tested positive for covid. Stated to pt since she is still having symptoms that she needs to go get a covid test done and then needs to call us with the results as if they do come back positive, we may need to reschedule her appt. Pt verbalized understanding. Nothing further needed.

## 2020-08-12 ENCOUNTER — Telehealth: Payer: Self-pay | Admitting: Internal Medicine

## 2020-08-12 NOTE — Telephone Encounter (Signed)
Called and spoke with pt who stated she did get a covid test done and received a call that the results were negative. Nothing further needed.

## 2020-08-31 ENCOUNTER — Encounter: Payer: Self-pay | Admitting: Internal Medicine

## 2020-08-31 ENCOUNTER — Other Ambulatory Visit: Payer: Self-pay

## 2020-08-31 ENCOUNTER — Ambulatory Visit (INDEPENDENT_AMBULATORY_CARE_PROVIDER_SITE_OTHER): Payer: Medicare Other | Admitting: Internal Medicine

## 2020-08-31 VITALS — BP 134/70 | HR 74 | Temp 97.0°F | Ht 66.0 in | Wt 133.0 lb

## 2020-08-31 DIAGNOSIS — R918 Other nonspecific abnormal finding of lung field: Secondary | ICD-10-CM

## 2020-08-31 DIAGNOSIS — Z23 Encounter for immunization: Secondary | ICD-10-CM | POA: Diagnosis not present

## 2020-08-31 DIAGNOSIS — J449 Chronic obstructive pulmonary disease, unspecified: Secondary | ICD-10-CM

## 2020-08-31 MED ORDER — MAGIC MOUTHWASH: 5.0000 mL | Freq: Four times a day (QID) | ORAL | 11 refills | Status: DC | PRN

## 2020-08-31 MED ORDER — LEVOFLOXACIN 500 MG PO TABS: 500.0000 mg | ORAL_TABLET | Freq: Every day | ORAL | 11 refills | Status: DC

## 2020-08-31 NOTE — Progress Notes (Signed)
[name] [name], female    DOB: 1955-12-07,     MRN: [id number]   Brief patient profile:  60 yowf quit smoking  04/2011  prev followed by [facility] then [facility] with dx of bronchiectasis/ MAC colonization with GOLD I criteria for copd 04/2017 self referred to pulmonary clinic in [place]  03/15/2020      LABS 04/04/17 IgG: 1066 IgA: 135 IgM: 153 IgE: 15 RAST panel: Negative ANA:  1:80 RF:  <14 Anti-CCP:  <16 SSA:  <1.0 SSB:  <1.0    08/17/16 ANA: Negative Rheumatoid factor:  <14    Admit date: 11/07/2018 Discharge date: 11/10/2018   Brief Hospitalization Summary: Please see all hospital notes, images, labs for full details of the hospitalization. [name] [name] a62 y.o.female,with history of bronchiectasis, depression, GERD, hypothyroidism, MAI came to hospital with complaint of shortness of breath, cough. Patient was recently discharged from the hospital on 10/02/2018. Patient says that she has experienced worsening shortness of breath, coughing up phlegm.   In the ED chest x-ray showed diffusely increased interstitial opacity with mild nodularity on the right suspect for acute interstitial inflammatory infectious process superimposed on underlying chronic changes Patient started on IV Levaquin.  Brief Admission Hx: 64 y.o.female,with history of bronchiectasis, depression, GERD, hypothyroidism, MAI came to hospital with complaint of shortness of breath, cough.  MDM/Assessment & Plan:  1. Healthcare associated pneumonia-given that she was hospitalized last month, seen on chest x-ray, patient started on IV Levaquin. She is clinically improving and feeling better, ambulating in the room.Continue levofloxacin.  She is on room air.   Blood culture no growth to date. Influenza panel is negative  2. Bronchiectasis/COPD-treated with DuoNebs every 6 hours, Solu-Medrol 60 mg IV every 6 hours, Mucinex 1200 mg p.o. twice daily. Add cough medication.  Discharged home on  a prednisone taper.  3. Hypokalemia-repleted.  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  Because of difficulty with transportation, the patient has requested to follow-up and establish [name] [name]. [name] [name] so that she can be seen by a local pulmonologist.  She understands that he is retiring at the end of the year but another pulmonologist is going to be taking over that practice.  5. Hypothyroidism-continue Synthroid   6  Bipolar Disorder - Resumed home medications.   History of Present Illness  03/15/2020  Pulmonary/ 1st office eval/[name] [name] / [name] [name]  Chief Complaint  Patient presents with  . Pulmonary Consult    Former pt of [name] [name]. She c/o occ non prod cough.    Living in same house since around 2008 with neg allergy profile for mold as above   Dyspnea:  MMRC2 = can't walk a nl pace on a flat grade s sob but does fine slow and flat   Cough: some worse p mowed grass one week prior to OV on Breo which "makes her cough" like something tickling her throat    Sleep: great p sleep med per [name] [name] s resp complaints SABA use: one daily/ rarely  rec Plan A = Automatic = Always=   Stop Breo (powder) Dulera 100 Take 2 puffs first thing in am and then another 2 puffs about 12 hours later.  Work on inhaler technique:  Plan B = Backup (to supplement plan A, not to replace it) Only use your albuterol inhaler as a rescue medication Plan C = Crisis (instead of Plan B but only if Plan B stops working) - only use your albuterol nebulizer if you first try Plan B  and it fails to help > ok to use the nebulizer up to every 4 hours but if start needing it regularly call for immediate appointment Please schedule a follow up visit in 3 months but call sooner if needed - please bring inhalers and nebulizer solution    06/20/2020  f/u ov/[name] [name] re:  Copd I/ bronchiectasis better on dulera  100 2bid / had covid vaccines x 2 / did not bring inhalers Chief  Complaint  Patient presents with  . Follow-up    Pt c/o cough with green sputum off and on for the past few months.    Dyspnea:  MMRC2 = can't walk a nl pace on a flat grade s sob but does fine slow and flat  Cough: variably purulent esp in am / does not have prn abx Sleeping: flat in bed/ on one pillow  SABA use: hasn't needed at all "you told me to stop these"  (note Plan B / C above)  02: none  rec For nasty mucus  >  zpak  Plan A = Automatic = Always=    dulera 100 Take 2 puffs first thing in am and then another 2 puffs about 12 hours later.  Plan B = Backup (to supplement plan A, not to replace it) Only use your albuterol inhaler as a rescue medication   Plan C = Crisis (instead of Plan B but only if Plan B stops working) - only use your albuterol nebulizer if you first try Plan B and it fails to help > ok to use the nebulizer up to every 4 hours      08/31/2020  f/u ov/[name] [name] re: had flare of green mucus   07/12/20 finished zpak then levaquin cleared the mucus and cough with neg covd testing 08/12/20 but asked to come in to regroup re longterm rx  Chief Complaint  Patient presents with  . Follow-up    sore throat that started this morning  Dyspnea:  No change doe  Cough: none now  Sleeping: ok  SABA use: dulera 100 2bid / none extra  02: no   No obvious day to day or daytime variability or assoc ongoing excess/ purulent sputum or mucus plugs or hemoptysis or cp or chest tightness, subjective wheeze or overt sinus or hb symptoms.   Sleeping  without nocturnal  or early am exacerbation  of respiratory  c/o's or need for noct saba. Also denies any obvious fluctuation of symptoms with weather or environmental changes or other aggravating or alleviating factors except as outlined above   No unusual exposure hx or h/o childhood pna/ asthma or knowledge of premature birth.  Current Allergies, Complete Past Medical History, Past Surgical History, Family History, and  Social History were reviewed in [employer] record.  ROS  The following are not active complaints unless bolded Hoarseness, sore throat, dysphagia, dental problems, itching, sneezing,  nasal congestion or discharge of excess mucus or purulent secretions, ear ache,   fever, chills, sweats, unintended wt loss or wt gain, classically pleuritic or exertional cp,  orthopnea pnd or arm/hand swelling  or leg swelling, presyncope, palpitations, abdominal pain, anorexia, nausea, vomiting, diarrhea  or change in bowel habits or change in bladder habits, change in stools or change in urine, dysuria, hematuria,  rash, arthralgias, visual complaints, headache, numbness, weakness or ataxia or problems with walking or coordination,  change in mood/anxiety  or  memory.        Current Meds  Medication  Sig  . albuterol (VENTOLIN HFA) 108 (90 Base) MCG/ACT inhaler INHALE TWO PUFFS INTO LUNGS EVERY 6 HOURS AS NEEDED FOR WHEEZING OR SHORTNESS OF BREATH  . ALPRAZolam (XANAX) 1 MG tablet Take 1 mg by mouth 4 (four) times daily.   . Azelastine-Fluticasone (DYMISTA) 137-50 MCG/ACT SUSP Place 1 spray 2 (two) times daily into the nose.  . Cholecalciferol (VITAMIN D3) 1.25 MG (50000 UT) CAPS Take 1 capsule by mouth every 14 (fourteen) days.  . ciclopirox (PENLAC) 8 % solution Apply topically at bedtime. Apply over nail and surrounding skin. Apply daily over previous coat. After seven (7) days, may remove with alcohol and continue cycle.  . cyanocobalamin (,VITAMIN B-12,) 1000 MCG/ML injection INJECT 1 ML SQ EVERY 14 DAYS.  . fluticasone (FLONASE) 50 MCG/ACT nasal spray Place 2 sprays into both nostrils daily.  Marland Kitchen levocetirizine (XYZAL) 5 MG tablet Take 1 tablet (5 mg total) by mouth every evening.  Marland Kitchen levothyroxine (SYNTHROID) 50 MCG tablet Take 1 tablet (50 mcg total) by mouth daily before breakfast.  . methylphenidate (RITALIN) 20 MG tablet Take 1 tablet (20 mg total) by mouth daily.  .  mometasone-formoterol (DULERA) 100-5 MCG/ACT AERO Inhale 2 puffs into the lungs in the morning and at bedtime. Take 2 puffs first thing in am and then another 2 puffs about 12 hours later.  Marland Kitchen oxyCODONE-acetaminophen (PERCOCET/ROXICET) 5-325 MG tablet Take 1 tablet by mouth every 8 (eight) hours as needed for severe pain.  . pantoprazole (PROTONIX) 40 MG tablet Take 1 tablet (40 mg total) by mouth daily.  . QUEtiapine (SEROQUEL) 25 MG tablet Take 25 mg by mouth at bedtime.   Marland Kitchen Respiratory Therapy Supplies (FLUTTER) DEVI Use as directed  . SUMAtriptan (IMITREX) 100 MG tablet TAKE 1 TABLET BY MOUTH EVERY 2 HOURS AS NEEDED FOR MIGRAINE - MAY REPEAT in TWO hours if HEADACHE persists OR recurs  . terbinafine (LAMISIL) 250 MG tablet Take 1 tablet (250 mg total) by mouth daily.                        Past Medical History:  Diagnosis Date  . Anxiety   . Bronchiectasis ([place])   . Chronic back pain   . Chronic neck pain   . Depression    bipolar- [name] [name] [name]  . Fatigue   . Fibromyalgia   . GERD (gastroesophageal reflux disease)   . Headache   . Hypothyroidism   . Low back pain    [name] Arnoldo Morale  . 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([place])   . Osteoarthritis   . Pneumonia   . Pulmonary nodule   . Sinus congestion   . Thyroid disease    hypothyroidsm  . Vertigo   . Vitamin B12 deficiency        Objective:       08/31/2020       133  06/20/20 129 lb (58.5 kg)  03/31/20 129 lb (58.5 kg)  03/15/20 130 lb (59 kg)      Vital signs reviewed  08/31/2020  - Note at rest 02 sats  99% on RA     HEENT : oropharynx clear, no erythema, no nodes   NECK :  without JVD/Nodes/TM/ nl carotid upstrokes bilaterally   LUNGS: no acc muscle use,  Min barrel  contour chest wall with bilateral  slightly decreased bs s audible wheeze and  without cough on insp or exp maneuvers and min  Hyperresonant  to  percussion bilaterally  CV:  RRR  no s3 or murmur or increase in P2, and no edema   ABD:   soft and nontender with pos end  insp Hoover's  in the supine position. No bruits or organomegaly appreciated, bowel sounds nl  MS:   Nl gait/  ext warm without deformities, calf tenderness, cyanosis or clubbing No obvious joint restrictions   SKIN: warm and dry without lesions    NEURO:  alert, approp, nl sensorium with  no motor or cerebellar deficits apparent.               Assessment

## 2020-08-31 NOTE — Patient Instructions (Signed)
Salt water warm gargles   If worse ok to use magic mouthwash swish and swallow four times daily   As long as you have cough or sorethroat add pepcid 20 mg (famotidine) after supper   For nasty mucus >   Levaquin 500 mg daily x 7 days   We will get you a new nebulzer   Keep your previous appt

## 2020-08-31 NOTE — Assessment & Plan Note (Addendum)
Quit smoking 2012  - 03/15/2020  Very poor hfa but cough / throat irritation from BREO > try dulera 100 2bid   > improved 08/31/2020 so no change rx needed   No evidence of thrush on low dose dulera but if becomes a problem will consider stiolto or bevespi options.  Reports non-specific sore throat today with no findings on exam or assoc nasal flares so rx with warm salt water and prn MMW if not better

## 2020-09-01 ENCOUNTER — Encounter: Payer: Self-pay | Admitting: Internal Medicine

## 2020-09-01 NOTE — Assessment & Plan Note (Signed)
See CT chest 07/22/17 -  08/18/17  MAI isolated from sputum, sensitive to Biaxin  > 06/20/2020 rec zpak prn purulent sputum  -   08/13/20 reported refractory green mucus so changed to levaquin and cleared it up  -  rec for flares rx levaquin 500 x 7 days          Each maintenance medication was reviewed in detail including emphasizing most importantly the difference between maintenance and prns and under what circumstances the prns are to be triggered using an action plan format where appropriate.  Total time for H and P, chart review, counseling, teaching device and generating customized AVS unique to this office visit / charting > 30 min

## 2020-09-08 ENCOUNTER — Other Ambulatory Visit: Payer: Self-pay | Admitting: Internal Medicine

## 2020-09-23 ENCOUNTER — Other Ambulatory Visit: Payer: Self-pay | Admitting: Internal Medicine

## 2020-09-25 ENCOUNTER — Other Ambulatory Visit: Payer: Self-pay | Admitting: Internal Medicine

## 2020-09-27 DIAGNOSIS — J449 Chronic obstructive pulmonary disease, unspecified: Secondary | ICD-10-CM | POA: Diagnosis not present

## 2020-10-06 ENCOUNTER — Ambulatory Visit (INDEPENDENT_AMBULATORY_CARE_PROVIDER_SITE_OTHER): Payer: Medicare Other

## 2020-10-06 ENCOUNTER — Ambulatory Visit (INDEPENDENT_AMBULATORY_CARE_PROVIDER_SITE_OTHER): Payer: Medicare Other | Admitting: Internal Medicine

## 2020-10-06 ENCOUNTER — Encounter: Payer: Self-pay | Admitting: Internal Medicine

## 2020-10-06 ENCOUNTER — Other Ambulatory Visit: Payer: Self-pay

## 2020-10-06 VITALS — BP 138/80 | HR 66 | Temp 98.7°F | Ht 66.0 in | Wt 135.8 lb

## 2020-10-06 DIAGNOSIS — R5383 Other fatigue: Secondary | ICD-10-CM

## 2020-10-06 DIAGNOSIS — E538 Deficiency of other specified B group vitamins: Secondary | ICD-10-CM

## 2020-10-06 DIAGNOSIS — F439 Reaction to severe stress, unspecified: Secondary | ICD-10-CM

## 2020-10-06 DIAGNOSIS — E559 Vitamin D deficiency, unspecified: Secondary | ICD-10-CM

## 2020-10-06 DIAGNOSIS — G8929 Other chronic pain: Secondary | ICD-10-CM

## 2020-10-06 DIAGNOSIS — Z0000 Encounter for general adult medical examination without abnormal findings: Secondary | ICD-10-CM | POA: Diagnosis not present

## 2020-10-06 DIAGNOSIS — M5440 Lumbago with sciatica, unspecified side: Secondary | ICD-10-CM

## 2020-10-06 DIAGNOSIS — E034 Atrophy of thyroid (acquired): Secondary | ICD-10-CM

## 2020-10-06 MED ORDER — OXYCODONE-ACETAMINOPHEN 5-325 MG PO TABS: ORAL_TABLET | ORAL | 0 refills | Status: DC

## 2020-10-06 MED ORDER — PROMETHAZINE HCL 12.5 MG PO TABS: 12.5000 mg | ORAL_TABLET | Freq: Three times a day (TID) | ORAL | 1 refills | Status: DC | PRN

## 2020-10-06 MED ORDER — OXYCODONE-ACETAMINOPHEN 5-325 MG PO TABS: 1.0000 | ORAL_TABLET | Freq: Three times a day (TID) | ORAL | 0 refills | Status: DC | PRN

## 2020-10-06 NOTE — Assessment & Plan Note (Signed)
CFS - not better

## 2020-10-06 NOTE — Progress Notes (Addendum)
Subjective:   Melody [name] is a 64 y.o. female who presents for Medicare Annual (Subsequent) preventive examination.  Review of Systems    No ROS. Medicare Wellness Visit. Additional risk factors are reflected in social history.  Cardiac Risk Factors include: advanced age (>51men, >62 women);family history of premature cardiovascular disease     Objective:    Today's Vitals   10/06/20 1640  BP: 138/80  Pulse: 66  Temp: 98.7 F (37.1 C)  SpO2: 98%  Weight: 135 lb 12.9 oz (61.6 kg)  Height: 5\' 6"  (1.676 m)  PainSc: 0-No pain   Body mass index is 21.92 kg/m.  Advanced Directives 10/06/2020 11/08/2018 11/07/2018 09/30/2018 09/30/2018 06/11/2018 07/03/2017  Does Patient Have a Medical Advance Directive? No No No No No No No  Would patient like information on creating a medical advance directive? No - Patient declined No - Patient declined No - Patient declined No - Patient declined No - Patient declined - -    Current Medications (verified) Outpatient Encounter Medications as of 10/06/2020  Medication Sig   albuterol (VENTOLIN HFA) 108 (90 Base) MCG/ACT inhaler INHALE TWO PUFFS INTO LUNGS EVERY 6 HOURS AS NEEDED FOR WHEEZING OR SHORTNESS OF BREATH   ALPRAZolam (XANAX) 1 MG tablet Take 1 mg by mouth 4 (four) times daily.    Azelastine-Fluticasone (DYMISTA) 137-50 MCG/ACT SUSP Place 1 spray 2 (two) times daily into the nose.   ciclopirox (PENLAC) 8 % solution Apply topically at bedtime. Apply over nail and surrounding skin. Apply daily over previous coat. After seven (7) days, may remove with alcohol and continue cycle.   cyanocobalamin (,VITAMIN B-12,) 1000 MCG/ML injection INJECT 1ML INTO THE SKIN EVERY 14 DAYS   fluticasone (FLONASE) 50 MCG/ACT nasal spray INSTILL TWO SPRAYS IN EACH NOSTRIL DAILY   levocetirizine (XYZAL) 5 MG tablet TAKE 1 TABLET BY MOUTH EVERY EVENING   levothyroxine (SYNTHROID) 50 MCG tablet TAKE 1 TABLET BY MOUTH DAILY BEFORE BREAKfast   methylphenidate  (RITALIN) 20 MG tablet Take 1 tablet (20 mg total) by mouth daily.   mometasone-formoterol (DULERA) 100-5 MCG/ACT AERO Inhale 2 puffs into the lungs in the morning and at bedtime. Take 2 puffs first thing in am and then another 2 puffs about 12 hours later.   pantoprazole (PROTONIX) 40 MG tablet TAKE 1 TABLET BY MOUTH DAILY   promethazine (PHENERGAN) 12.5 MG tablet Take 1 tablet (12.5 mg total) by mouth every 8 (eight) hours as needed for up to 7 days for nausea or vomiting.   QUEtiapine (SEROQUEL) 25 MG tablet Take 25 mg by mouth at bedtime.    Respiratory Therapy Supplies (FLUTTER) DEVI Use as directed   SUMAtriptan (IMITREX) 100 MG tablet TAKE 1 TABLET BY MOUTH EVERY 2 HOURS AS NEEDED FOR MIGRAINE - MAY REPEAT in TWO hours if HEADACHE persists OR recurs   terbinafine (LAMISIL) 250 MG tablet Take 1 tablet (250 mg total) by mouth daily.   No facility-administered encounter medications on file as of 10/06/2020.    Allergies (verified) Flagyl [metronidazole], Doxycycline hyclate, Gabapentin, Nabumetone, Sulfa antibiotics, Tetracycline hcl, and Tramadol hcl   History: Past Medical History:  Diagnosis Date   Anxiety    Bronchiectasis (HCC)    Chronic back pain    Chronic neck pain    Depression    bipolar- Dr [name]   Fatigue    Fibromyalgia    GERD (gastroesophageal reflux disease)    Headache    Hypothyroidism    Low back pain  Dr Arnoldo Morale   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[facility])    Osteoarthritis    Pneumonia    Pulmonary nodule    Sinus congestion    Thyroid disease    hypothyroidsm   Vertigo    Vitamin B12 deficiency    Past Surgical History:  Procedure Laterality Date   ABDOMINAL HYSTERECTOMY N/A 01/10/2016   Procedure: HYSTERECTOMY ABDOMINAL;  Surgeon: Nunzio Cobbs, MD;  Location: [facility];  Service: Gynecology;  Laterality: N/A;   ABDOMINAL SACROCOLPOPEXY N/A 01/10/2016   Procedure: ABDOMINO SACROCOLPOPEXY ;  Surgeon: Nunzio Cobbs, MD;   Location: [facility];  Service: Gynecology;  Laterality: N/A;   ANTERIOR AND POSTERIOR REPAIR N/A 01/10/2016   Procedure:  POSTERIOR REPAIR (RECTOCELE);  Surgeon: Nunzio Cobbs, MD;  Location: [facility];  Service: Gynecology;  Laterality: N/A;   BLADDER SUSPENSION N/A 01/10/2016   Procedure: TRANSVAGINAL TAPE (TVT) PROCEDURE exact midurethral sling;  Surgeon: Nunzio Cobbs, MD;  Location: [facility];  Service: Gynecology;  Laterality: N/A;   CERVICAL LAMINECTOMY  2001 and 2995   Dr Harmon Pier N/A 01/10/2016   Procedure: CYSTOSCOPY;  Surgeon: Nunzio Cobbs, MD;  Location: [facility];  Service: Gynecology;  Laterality: N/A;   SALPINGOOPHORECTOMY Bilateral 01/10/2016   Procedure: BILATERAL SALPINGO OOPHORECTOMY;  Surgeon: Nunzio Cobbs, MD;  Location: [facility];  Service: Gynecology;  Laterality: Bilateral;   TUBAL LIGATION  1980   Family History  Problem Relation Age of Onset   Heart attack Father        dec heart disease age 75   Hypertension Father    Lung disease Father    Cancer Mother        dec stomach ca--age 60   Breast cancer Cousin    Arthritis Other    Breast cancer Paternal Aunt    Social History   Socioeconomic History   Marital status: Legally Separated    Spouse name: Not on file   Number of children: Not on file   Years of education: Not on file   Highest education level: Not on file  Occupational History   Not on file  Tobacco Use   Smoking status: Former Smoker    Packs/day: 1.00    Years: 35.00    Pack years: 35.00    Types: Cigarettes    Start date: 11/21/1962    Quit date: 05/06/2011    Years since quitting: 9.4   Smokeless tobacco: Never Used   Tobacco comment: stopped periodically - peak rate of 1 ppd  Vaping Use   Vaping Use: Never used  Substance and Sexual Activity   Alcohol use: No    Alcohol/week: 0.0 standard drinks   Drug use: No   Sexual activity: Not Currently    Birth control/protection: Post-menopausal,  Surgical    Comment: Tubal/Hyst/BSO  Other Topics Concern   Not on file  Social History Narrative   [facility] (04/04/17):   Originally from [place], [place]. 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 Recently had a persian cat that passed. No bird exposure. She denies any indoor plants currently. She does use wood burning for heat. She reports there is water getting under her foundation. She has mold in her home that she reports is in the 2nd floor of her home. She has recently started seeing mold under her bed. She reports  her home smells musty & damp. She has always lived in [place].    Social Determinants of Health   Financial Resource Strain: Low Risk    Difficulty of Paying Living Expenses: Not hard at all  Food Insecurity: No Food Insecurity   Worried About Charity fundraiser in the Last Year: Never true   [place] in the Last Year: Never true  Transportation Needs: No Transportation Needs   Lack of Transportation (Medical): No   Lack of Transportation (Non-Medical): No  Physical Activity: Inactive   Days of Exercise per Week: 0 days   Minutes of Exercise per Session: 0 min  Stress: Stress Concern Present   Feeling of Stress : Very much  Social Connections: Socially Isolated   Frequency of Communication with Friends and Family: More than three times a week   Frequency of Social Gatherings with Friends and Family: Never   Attends Religious Services: Never   Marine scientist or Organizations: No   Attends Music therapist: Never   Marital Status: Separated    Tobacco Counseling Counseling given: Not Answered Comment: stopped periodically - peak rate of 1 ppd   Clinical Intake:  Pre-visit preparation completed: Yes  Pain : No/denies pain Pain Score: 0-No pain     BMI - recorded: 21.92 Nutritional Status: BMI of 19-24  Normal Nutritional Risks: None Diabetes: No  How often do you need to  have someone help you when you read instructions, pamphlets, or other written materials from your doctor or pharmacy?: 1 - Never What is the last grade level you completed in school?: HSG  Diabetic? no  Interpreter Needed?: No  Information entered by :: Lisette Abu, LPN   Activities of Daily Living In your present state of health, do you have any difficulty performing the following activities: 10/06/2020 07/06/2020  Hearing? N Y  Vision? N N  Difficulty concentrating or making decisions? N Y  Walking or climbing stairs? N N  Dressing or bathing? N N  Doing errands, shopping? N N  Preparing Food and eating ? N -  Using the Toilet? N -  In the past six months, have you accidently leaked urine? N -  Do you have problems with loss of bowel control? N -  Managing your Medications? N -  Managing your Finances? N -  Housekeeping or managing your Housekeeping? N -  Some recent data might be hidden    Patient Care Team: Plotnikov, Evie Lacks, MD as PCP - General Nunzio Cobbs, MD as Consulting Physician (Obstetrics and Gynecology) Chucky May, MD as Consulting Physician (Psychiatry) Standley Brooking, LCSW as [place] (Licensed Clinical Social Worker)  Indicate any recent [place] you may have received from other than [facility] providers in the past year (date may be approximate).     Assessment:   This is a routine wellness examination for Melody [name].  Hearing/Vision screen No exam data present  Dietary issues and exercise activities discussed: Current Exercise Habits: The patient does not participate in regular exercise at present, Exercise limited by: psychological condition(s);respiratory conditions(s)  Goals   None    Depression Screen PHQ 2/9 Scores 10/06/2020 11/12/2018 11/12/2018 12/25/2017 10/16/2017 05/13/2015  PHQ - 2 Score 6 2 2  0 1 1  PHQ- 9 Score 12 9 - - - -    Fall Risk Fall Risk  10/06/2020 07/06/2020 11/12/2018  12/25/2017 10/16/2017  Falls in the past year? 0 1 0  Yes Yes  Number falls in past yr: 0 0 - 1 1  Injury with Fall? 0 0 - No No  Risk for fall due to : No Fall Risks - - - -  Follow up Falls evaluation completed - - - -    FALL RISK PREVENTION PERTAINING TO THE HOME:  Any stairs in or around the home? Yes  If so, are there any without handrails? No  Home free of loose throw rugs in walkways, pet beds, electrical cords, etc? Yes  Adequate lighting in your home to reduce risk of falls? Yes   ASSISTIVE DEVICES UTILIZED TO PREVENT FALLS:  Life alert? No  Use of a cane, walker or w/c? No  Grab bars in the bathroom? No  Shower chair or bench in shower? No  Elevated toilet seat or a handicapped toilet? No   TIMED UP AND GO:  Was the test performed? No .  Length of time to ambulate 10 feet: 0 sec.   Gait steady and fast without use of assistive device  Cognitive Function: Normal cognitive status assessed by direct observation by this Nurse Health Advisor. No abnormalities found.          Immunizations Immunization History  Administered Date(s) Administered   Fluad Quad(high Dose 65+) 08/31/2020   Influenza Split 08/05/2012   Influenza Whole 09/01/2008, 09/02/2009   Influenza,inj,Quad PF,6+ Mos 08/10/2016, 08/23/2017, 08/06/2018, 06/24/2019   Influenza-Unspecified 08/29/2013, 07/30/2014, 08/01/2015   Moderna SARS-COVID-2 Vaccination 04/04/2020, 05/02/2020   Pneumococcal Conjugate-13 08/01/2015   Pneumococcal Polysaccharide-23 05/16/2018   Tdap 08/01/2015   Zoster 08/01/2015    TDAP status: Up to date  Flu Vaccine status: Up to date  Pneumococcal vaccine status: Up to date  Covid-19 vaccine status: Completed vaccines  Qualifies for Shingles Vaccine? Yes   Zostavax completed Yes   Shingrix Completed?: No.    Education has been provided regarding the importance of this vaccine. Patient has been advised to call insurance company to determine out of pocket expense if  they have not yet received this vaccine. Advised may also receive vaccine at local pharmacy or Health Dept. Verbalized acceptance and understanding.  Screening Tests Health Maintenance  Topic Date Due   COLONOSCOPY  Never done   MAMMOGRAM  01/31/2019   DEXA SCAN  Never done   COVID-19 Vaccine (3 - Moderna risk 4-dose series) 05/30/2020   PNA vac Low Risk Adult (2 of 2 - PPSV23) 05/17/2023   TETANUS/TDAP  07/31/2025   INFLUENZA VACCINE  Completed   Hepatitis C Screening  Completed   HIV Screening  Completed    Health Maintenance  Health Maintenance Due  Topic Date Due   COLONOSCOPY  Never done   MAMMOGRAM  01/31/2019   DEXA SCAN  Never done   COVID-19 Vaccine (3 - Moderna risk 4-dose series) 05/30/2020    Colorectal cancer screening: Type of screening: FOBT/FIT. Completed 08/29/2018. Repeat every 3 years  Mammogram status: Completed 02/12/2017. Repeat every year   Bone density status: never done  Lung Cancer Screening: (Low Dose CT Chest recommended if Age 61-80 years, 30 pack-year currently smoking OR have quit w/in 15years.) does not qualify.   Lung Cancer Screening Referral: no  Additional Screening:  Hepatitis C Screening: does not qualify; Completed no  Vision Screening: Recommended annual ophthalmology exams for early detection of glaucoma and other disorders of the eye. Is the patient up to date with their annual eye exam?  Yes  Who is the provider or what is the name  of the office in which the patient attends annual eye exams? [place], [place] If pt is not established with a provider, would they like to be referred to a provider to establish care? No .   Dental Screening: Recommended annual dental exams for proper oral hygiene  Community Resource Referral / Chronic Care Management: CRR required this visit?  No   CCM required this visit?  No      Plan:     I have personally reviewed and noted the following in the patient's chart:   Medical and social  history Use of alcohol, tobacco or illicit drugs  Current medications and supplements Functional ability and status Nutritional status Physical activity Advanced directives List of other physicians Hospitalizations, surgeries, and ER visits in previous 12 months Vitals Screenings to include cognitive, depression, and falls Referrals and appointments  In addition, I have reviewed and discussed with patient certain preventive protocols, quality metrics, and best practice recommendations. A written personalized care plan for preventive services as well as general preventive health recommendations were provided to patient.     Sheral Flow, LPN   12/03/4268   Nurse Notes: n/a   Medical screening examination/treatment/procedure(s) were performed by non-physician practitioner and as supervising physician I was immediately available for consultation/collaboration.  I agree with above. Lew Dawes, MD

## 2020-10-06 NOTE — Assessment & Plan Note (Signed)
Percocet prn  Potential benefits of a long term opioids use as well as potential risks (i.e. addiction risk, apnea etc) and complications (i.e. Somnolence, constipation and others) were explained to the patient and were aknowledged.  
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
## 2020-10-06 NOTE — Assessment & Plan Note (Signed)
On Levothroid 

## 2020-10-06 NOTE — Assessment & Plan Note (Signed)
On B12 

## 2020-10-06 NOTE — Patient Instructions (Signed)
[employer] [id number] Silent Electric Oil-Filled Radiator Heater with 360-Degree Swivel Casters, Gray Visit the [employer] Store 4.4 out of 5 stars    501 ratings List Price: $64.95 Details Price: $60.10

## 2020-10-07 ENCOUNTER — Telehealth: Payer: Self-pay

## 2020-10-07 NOTE — Telephone Encounter (Cosign Needed)
Just wanted to check up on patient after our visit from yesterday.  Patient stated that she is feeling much better today and will be spending time with her grandsons this weekend.  Advised patient to call office if she needs anything.

## 2020-10-08 NOTE — Assessment & Plan Note (Signed)
We discussed stress.  She has been using electric heaters at home for sleep.

## 2020-10-08 NOTE — Assessment & Plan Note (Signed)
Continue with vitamin D 

## 2020-11-02 IMAGING — CT CT CHEST WITH CONTRAST
2 of 3 series · 15 of 36 positions shown, 18 images · IV contrast (omnipaque)
Comparison: Chest radiograph, 11/07/2018, CT chest, 02/26/2017

CLINICAL DATA: Recurrent pneumonia

EXAM:
CT CHEST WITH CONTRAST
TECHNIQUE: Multidetector CT imaging of the chest was performed during
intravenous contrast administration.
CONTRAST:  75mL OMNIPAQUE IOHEXOL 300 MG/ML  SOLN

[Series 2: axial st · axial · 0.66mm/px · z∈[+1163,+1457]mm · 12 of 173 slices shown, 15 images]
[im 13/173  mediastinal]
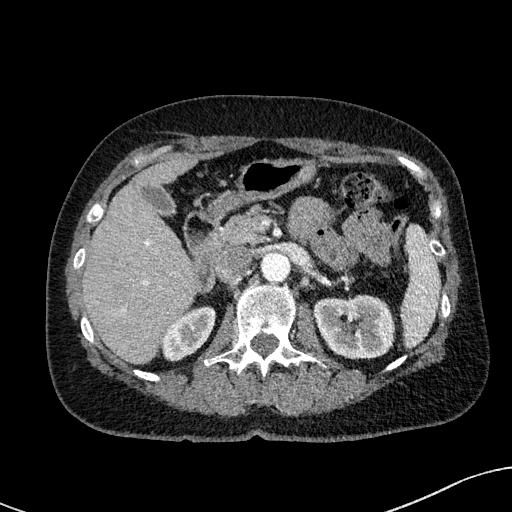
[im 13/173  lung]
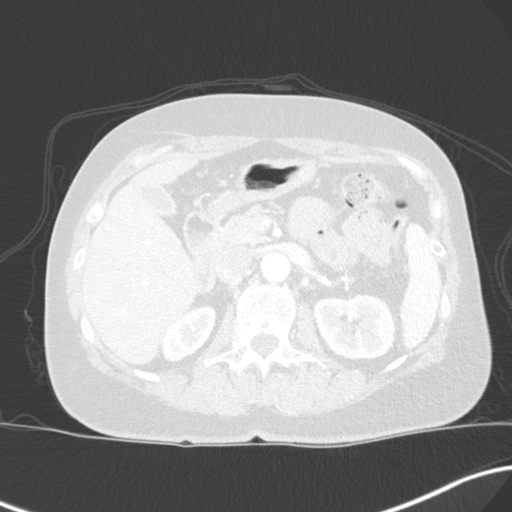
[im 26/173  lung]
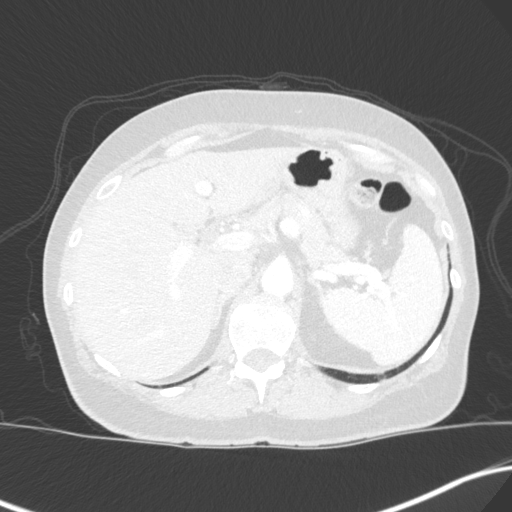
[im 39/173  lung]
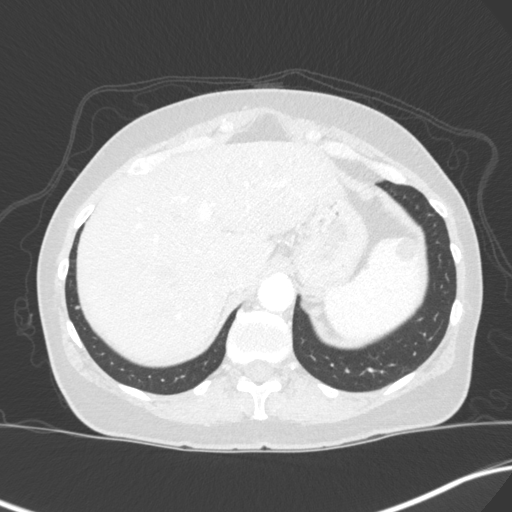
[im 51/173  lung]
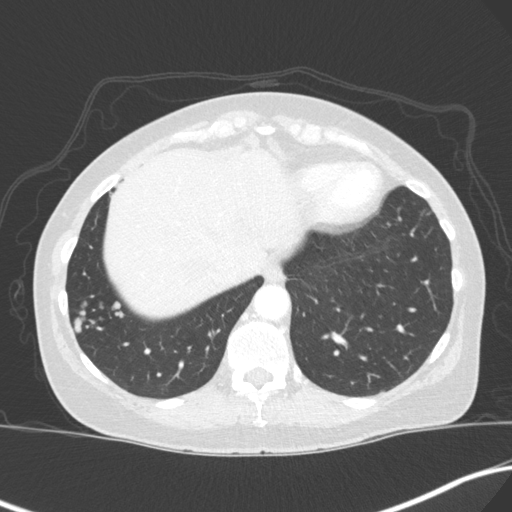
[im 64/173  mediastinal]
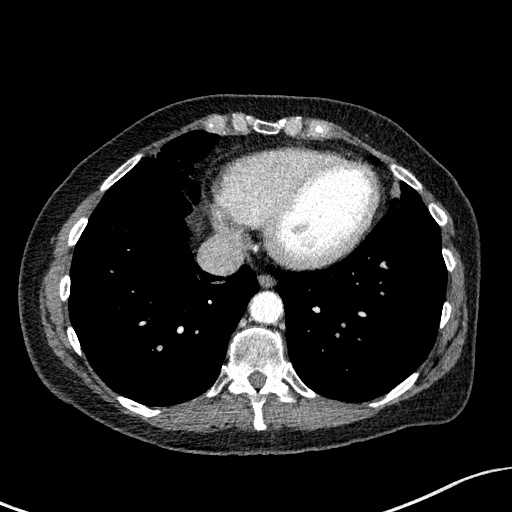
[im 64/173  lung]
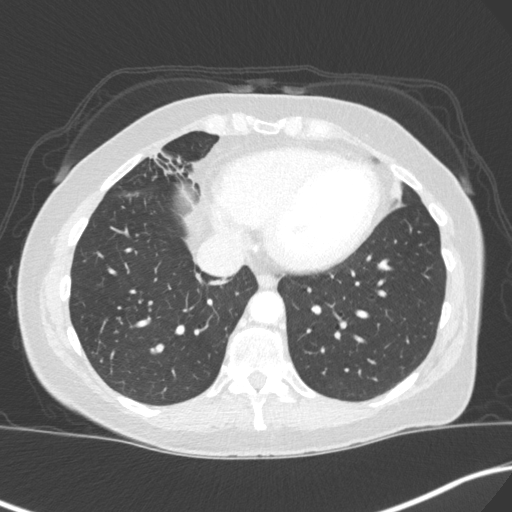
[im 77/173  lung]
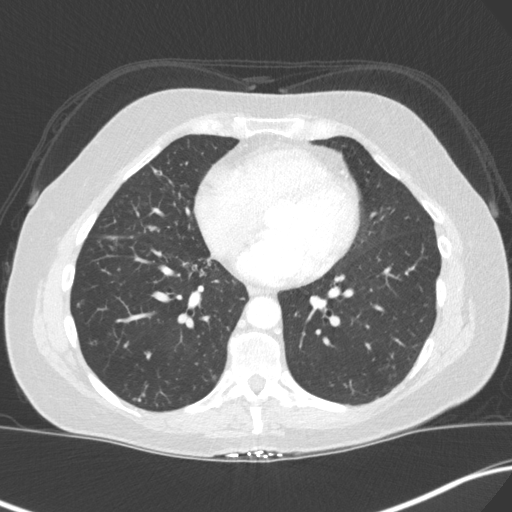
[im 96/173  lung]
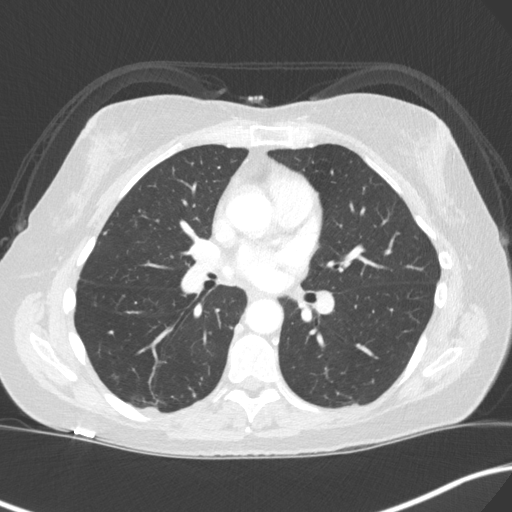
[im 109/173  lung]
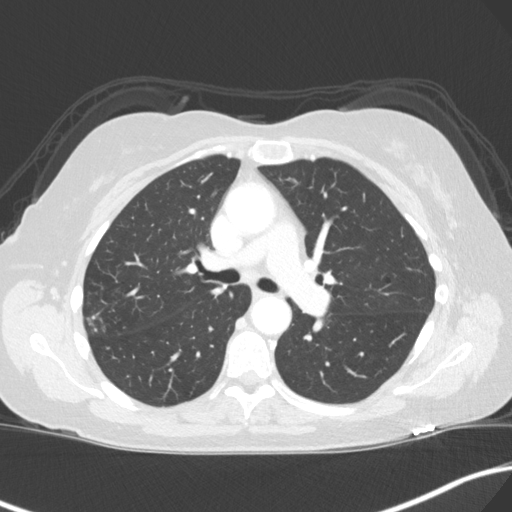
[im 122/173  mediastinal]
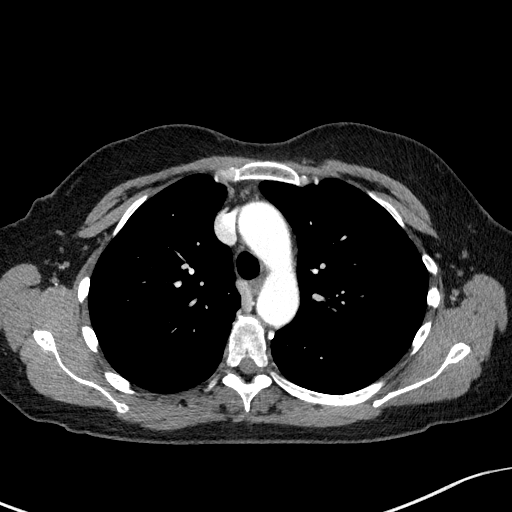
[im 122/173  lung]
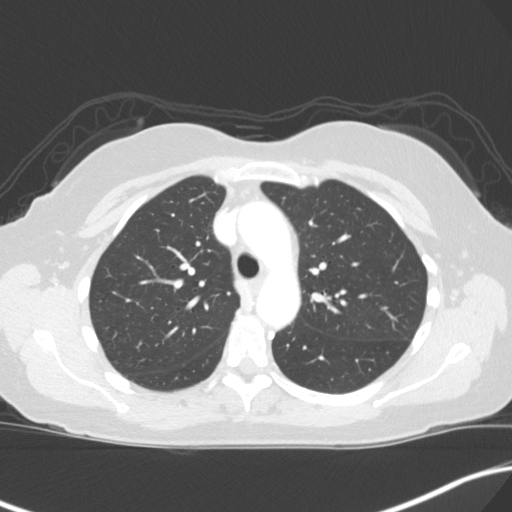
[im 134/173  lung]
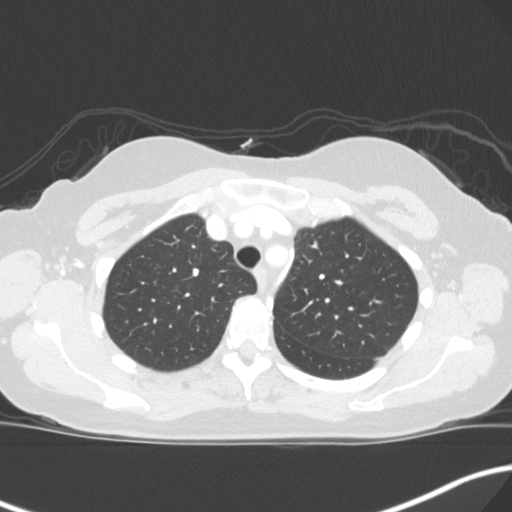
[im 147/173  lung]
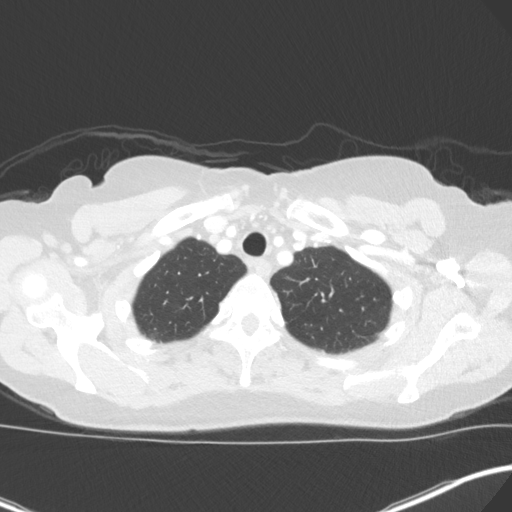
[im 160/173  lung]
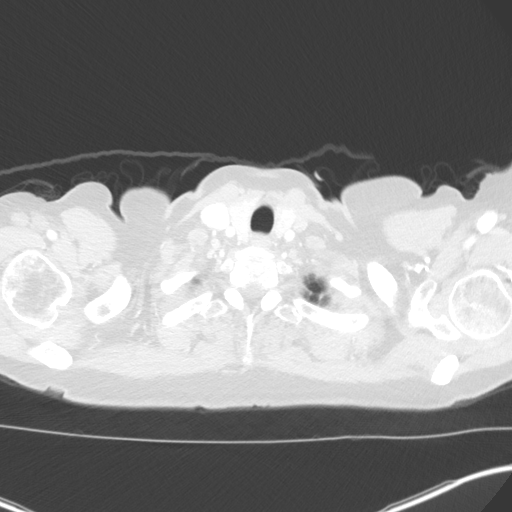

[Series 5: coronal · coronal · 0.70mm/px · 3 of 116 slices shown]
[im 24/116  lung]
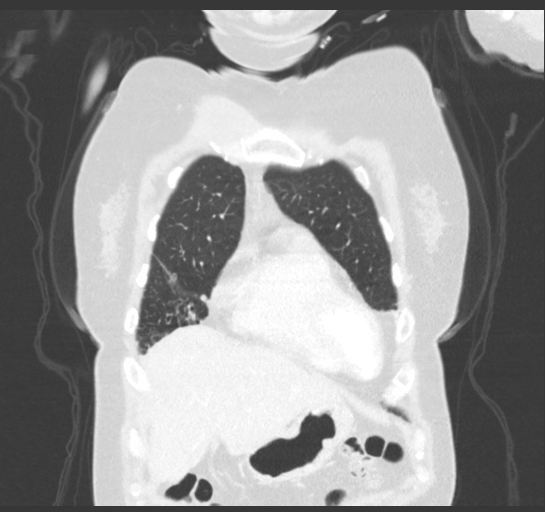
[im 47/116  lung]
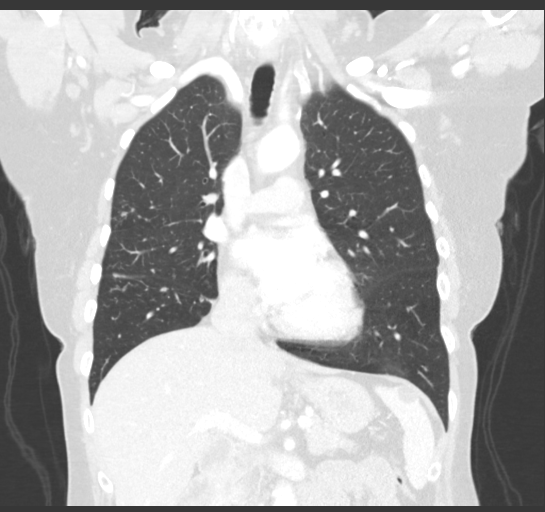
[im 70/116  lung]
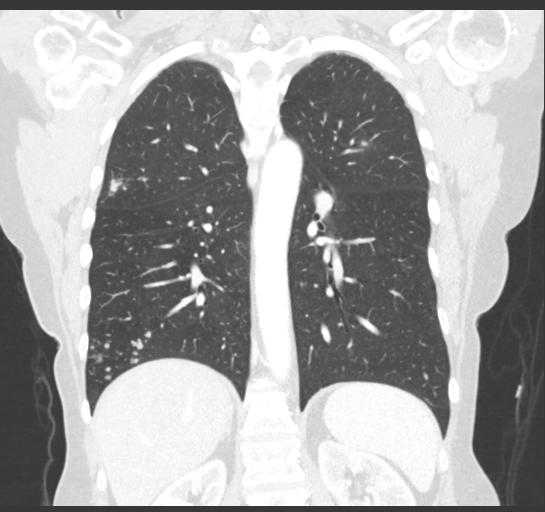

[15 of 36 positions shown; findings below may reference images not displayed]

FINDINGS: Cardiovascular: No significant vascular findings. Normal heart size.
No pericardial effusion.

Mediastinum/Nodes: No enlarged mediastinal, hilar, or axillary lymph
nodes. Thyroid gland, trachea, and esophagus demonstrate no
significant findings.

Lungs/Pleura: There is extensive bilateral, right greater than left
clustered tree-in-bud and centrilobular nodularity with bandlike
scarring, consolidation, and bronchiectasis of the right middle lobe
and lingula. These findings are generally improved compared to prior
CT dated 02/26/2017, although there are areas of new and fluctuant
nodularity, for example in the right lung base (series 4, image
122). No pleural effusion or pneumothorax.

Upper Abdomen: No acute abnormality.

Musculoskeletal: No chest wall mass or suspicious bone lesions
identified.
IMPRESSION: There is extensive bilateral, right greater than left clustered
tree-in-bud and centrilobular nodularity with bandlike scarring,
consolidation, and bronchiectasis of the right middle lobe and
lingula. These findings are generally improved compared to prior CT
dated 02/26/2017, although there are areas of new and fluctuant
nodularity, for example in the right lung base (series 4, image
122). Findings are consistent with chronic, ongoing atypical
infection, particularly atypical mycobacterium.

## 2020-12-06 DIAGNOSIS — M7502 Adhesive capsulitis of left shoulder: Secondary | ICD-10-CM | POA: Diagnosis not present

## 2021-01-05 ENCOUNTER — Encounter: Payer: Self-pay | Admitting: Internal Medicine

## 2021-01-05 ENCOUNTER — Telehealth: Payer: Medicare Other | Admitting: Internal Medicine

## 2021-01-05 DIAGNOSIS — R059 Cough, unspecified: Secondary | ICD-10-CM

## 2021-01-05 DIAGNOSIS — J069 Acute upper respiratory infection, unspecified: Secondary | ICD-10-CM

## 2021-01-05 MED ORDER — BENZONATATE 200 MG PO CAPS: 200.0000 mg | ORAL_CAPSULE | Freq: Three times a day (TID) | ORAL | 1 refills | Status: DC | PRN

## 2021-01-05 NOTE — Assessment & Plan Note (Signed)
Tessalon prn

## 2021-01-05 NOTE — Assessment & Plan Note (Addendum)
Just finished Levaquin Start Tessalon prn

## 2021-01-05 NOTE — Progress Notes (Signed)
Virtual Visit via Telephone Note  I connected with Melody [name] on 01/05/21 at  2:20 PM EST by telephone and verified that I am speaking with the correct person using two identifiers.  Location: Patient: Home Provider: [place] office No one else is present at the visit   I discussed the limitations, risks, security and privacy concerns of performing an evaluation and management service by telephone and the availability of in person appointments. I also discussed with the patient that there may be a patient responsible charge related to this service. The patient expressed understanding and agreed to proceed.   History of Present Illness: Melody [name] just had a cold with cough.  She was treated with Levaquin.  She finished her 7-day prescription.  She continues to have dry cough.  No fever.  No shortness of breath.  No chest pain   Observations/Objective: The patient sounds a little congested on the phone  Assessment and Plan:  See plan Follow Up Instructions:    I discussed the assessment and treatment plan with the patient. The patient was provided an opportunity to ask questions and all were answered. The patient agreed with the plan and demonstrated an understanding of the instructions.   The patient was advised to call back or seek an in-person evaluation if the symptoms worsen or if the condition fails to improve as anticipated.  I provided 15 minutes of non-face-to-face time during this encounter.   Walker Kehr, MD

## 2021-01-06 ENCOUNTER — Other Ambulatory Visit: Payer: Self-pay | Admitting: Internal Medicine

## 2021-01-10 ENCOUNTER — Other Ambulatory Visit: Payer: Self-pay | Admitting: Internal Medicine

## 2021-01-11 NOTE — Telephone Encounter (Signed)
Check [place] registry last filled 12/19/2020. MD is out of the office pls advise on refill.Marland KitchenJohny Chess

## 2021-01-18 ENCOUNTER — Ambulatory Visit (INDEPENDENT_AMBULATORY_CARE_PROVIDER_SITE_OTHER): Payer: Medicare Other | Admitting: Internal Medicine

## 2021-01-18 ENCOUNTER — Encounter: Payer: Self-pay | Admitting: Internal Medicine

## 2021-01-18 ENCOUNTER — Ambulatory Visit (HOSPITAL_COMMUNITY)
Admission: RE | Admit: 2021-01-18 | Discharge: 2021-01-18 | Disposition: A | Discharge status: Home / Self Care | Payer: Medicare Other | Source: Ambulatory Visit | Attending: Internal Medicine | Admitting: Internal Medicine

## 2021-01-18 ENCOUNTER — Other Ambulatory Visit: Payer: Self-pay

## 2021-01-18 DIAGNOSIS — J329 Chronic sinusitis, unspecified: Secondary | ICD-10-CM | POA: Diagnosis not present

## 2021-01-18 DIAGNOSIS — R918 Other nonspecific abnormal finding of lung field: Secondary | ICD-10-CM | POA: Insufficient documentation

## 2021-01-18 DIAGNOSIS — J984 Other disorders of lung: Secondary | ICD-10-CM | POA: Diagnosis not present

## 2021-01-18 DIAGNOSIS — J449 Chronic obstructive pulmonary disease, unspecified: Secondary | ICD-10-CM | POA: Diagnosis not present

## 2021-01-18 MED ORDER — CLOTRIMAZOLE 10 MG MT TROC: 10.0000 mg | Freq: Every day | OROMUCOSAL | 11 refills | Status: DC

## 2021-01-18 MED ORDER — LEVOFLOXACIN 500 MG PO TABS: 500.0000 mg | ORAL_TABLET | Freq: Every day | ORAL | 11 refills | Status: DC

## 2021-01-18 NOTE — Progress Notes (Signed)
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 Melody [name] a62 y.o.female,with history of bronchiectasis, depression, GERD, hypothyroidism, MAI came to hospital with complaint of shortness of breath, cough. Patient was recently discharged from the hospital on 10/02/2018. Patient says that she has experienced worsening shortness of breath, coughing up phlegm.   In the ED chest x-ray showed diffusely increased interstitial opacity with mild nodularity on the right suspect for acute interstitial inflammatory infectious process superimposed on underlying chronic changes Patient started on IV Levaquin.  Brief Admission Hx: 65 y.o.female,with history of bronchiectasis, depression, GERD, hypothyroidism, MAI came to hospital with complaint of shortness of breath, cough.  MDM/Assessment & Plan:  1. Healthcare associated pneumonia-given that she was hospitalized last month, seen on chest x-ray, patient started on IV Levaquin. She is clinically improving and feeling better, ambulating in the room.Continue levofloxacin.  She is on room air.   Blood culture no growth to date. Influenza panel is negative  2. Bronchiectasis/COPD-treated with DuoNebs every 6 hours, Solu-Medrol 60 mg IV every 6 hours, Mucinex 1200 mg p.o. twice daily. Add cough medication.  Discharged home on  a prednisone taper.  3. Hypokalemia-repleted.  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  Because of difficulty with transportation, the patient has requested to follow-up and establish [name] with Dr. Luan Pulling so that she can be seen by a local pulmonologist.  She understands that he is retiring at the end of the year but another pulmonologist is going to be taking over that practice.  5. Hypothyroidism-continue Synthroid   6  Bipolar Disorder - Resumed home medications.   History of Present Illness  03/15/2020  Pulmonary/ 1st office eval/Melody [name] / Melody [name]  Chief Complaint  Patient presents with  . Pulmonary Consult    Former pt of Dr Luan Pulling. She c/o occ non prod cough.    Living in same house since around 2008 with neg allergy profile for mold as above   Dyspnea:  MMRC2 = can't walk a nl pace on a flat grade s sob but does fine slow and flat   Cough: some worse p mowed grass one week prior to OV on Breo which "makes her cough" like something tickling her throat    Sleep: great p sleep med per Melody [name] s resp complaints SABA use: one daily/ rarely  rec Plan A = Automatic = Always=   Stop Breo (powder) Dulera 100 Take 2 puffs first thing in am and then another 2 puffs about 12 hours later.  Work on inhaler technique:  Plan B = Backup (to supplement plan A, not to replace it) Only use your albuterol inhaler as a rescue medication Plan C = Crisis (instead of Plan B but only if Plan B stops working) - only use your albuterol nebulizer if you first try Plan B  and it fails to help > ok to use the nebulizer up to every 4 hours but if start needing it regularly call for immediate appointment Please schedule a follow up visit in 3 months but call sooner if needed - please bring inhalers and nebulizer solution    06/20/2020  f/u ov/Melody [name] office/Melody [name] re:  Copd I/ bronchiectasis better on dulera  100 2bid / had covid vaccines x 2 / did not bring inhalers Chief  Complaint  Patient presents with  . Follow-up    Pt c/o cough with green sputum off and on for the past few months.    Dyspnea:  MMRC2 = can't walk a nl pace on a flat grade s sob but does fine slow and flat  Cough: variably purulent esp in am / does not have prn abx Sleeping: flat in bed/ on one pillow  SABA use: hasn't needed at all "you told me to stop these"  (note Plan B / C above)  02: none  rec For nasty mucus  >  zpak  Plan A = Automatic = Always=    dulera 100 Take 2 puffs first thing in am and then another 2 puffs about 12 hours later.  Plan B = Backup (to supplement plan A, not to replace it) Only use your albuterol inhaler as a rescue medication   Plan C = Crisis (instead of Plan B but only if Plan B stops working) - only use your albuterol nebulizer if you first try Plan B and it fails to help > ok to use the nebulizer up to every 4 hours      08/31/2020  f/u ov/Melody [name] office/Melody [name] re: had flare of green mucus   07/12/20 finished zpak then levaquin cleared the mucus and cough with neg covd testing 08/12/20 but asked to come in to regroup re longterm rx  Chief Complaint  Patient presents with  . Follow-up    sore throat that started this morning  Dyspnea:  No change doe  Cough: none now  Sleeping: ok  SABA use: dulera 100 2bid / none extra  02: no rec Salt water warm gargles  If worse ok to use magic mouthwash swish and swallow four times daily  As long as you have cough or sorethroat add pepcid 20 mg (famotidine) after supper  For nasty mucus >   Levaquin 500 mg daily x 7 days  We will get you a new nebulzer  Keep your previous appt     01/18/2021  f/u ov/Melody [name] office/Melody [name] re: COPD GOLD I/ bronchiecatasis / poor insight into meds on dulera 100/ flutter valve Chief Complaint  Patient presents with  . Follow-up    Breathing is "ok". She states that she has been having palpitations "for a while". She states that her "chest feels raw" x 3 days- neb tx helps  but she is not sure what it is. Her cough is prod with clear sputum.   Dyspnea:  MMRC1 = can walk nl pace, flat grade, can't hurry or go uphills or steps s sob  Cough: more freq x one month but  not worse in am / clear mucus only, no change p took levaquin which she was not supposed so use s change in sputum color/ assoc with nasal congestion which is much worse than usual despite flonase/xyzal/dymista Sleeping: flat bed / one pillow  SABA use: once a month/ used neb night prior to OV   02: none  Covid status: vax x 2 last 05/02/20  No obvious day to day or daytime variability or assoc  purulent sputum or mucus plugs or hemoptysis or cp or chest tightness, subjective wheeze or overt sinus or hb symptoms.   Sleeping as above without nocturnal  or early am exacerbation  of respiratory  c/o's or need for noct saba. Also denies any obvious fluctuation of symptoms with weather or environmental changes or other aggravating or alleviating factors except as outlined above   No unusual exposure hx or h/o childhood pna/ asthma or knowledge of premature birth.  Current Allergies, Complete Past Medical History, Past Surgical History, Family History, and Social History were reviewed in [employer] record.  ROS  The following are not active complaints unless bolded Hoarseness, sore throat, dysphagia, dental problems, itching, sneezing,  nasal congestion or discharge of excess mucus or purulent secretions, ear ache,   fever, chills, sweats, unintended wt loss or wt gain, classically pleuritic or exertional cp,  orthopnea pnd or arm/hand swelling  or leg swelling, presyncope, palpitations, abdominal pain, anorexia, nausea, vomiting, diarrhea  or change in bowel habits or change in bladder habits, change in stools or change in urine, dysuria, hematuria,  rash, arthralgias, visual complaints, headache, numbness, weakness or ataxia or problems with walking or coordination,  change in mood or   memory.        Current Meds  Medication Sig  . albuterol (VENTOLIN HFA) 108 (90 Base) MCG/ACT inhaler INHALE TWO PUFFS INTO LUNGS EVERY 6 HOURS AS NEEDED FOR WHEEZING OR SHORTNESS OF BREATH  . ALPRAZolam (XANAX) 1 MG tablet Take 1 mg by mouth 4 (four) times daily.  . Azelastine-Fluticasone (DYMISTA) 137-50 MCG/ACT SUSP Place 1 spray 2 (two) times daily into the nose.  . ciclopirox (PENLAC) 8 % solution Apply topically at bedtime. Apply over nail and surrounding skin. Apply daily over previous coat. After seven (7) days, may remove with alcohol and continue cycle.  . cyanocobalamin (,VITAMIN B-12,) 1000 MCG/ML injection INJECT 1ML INTO THE SKIN EVERY 14 DAYS  . fluticasone (FLONASE) 50 MCG/ACT nasal spray INSTILL TWO SPRAYS IN EACH NOSTRIL DAILY  . levocetirizine (XYZAL) 5 MG tablet TAKE 1 TABLET BY MOUTH EVERY EVENING  . levothyroxine (SYNTHROID) 50 MCG tablet TAKE 1 TABLET BY MOUTH DAILY BEFORE BREAKfast  . methylphenidate (RITALIN) 20 MG tablet Take 1 tablet (20 mg total) by mouth daily.  . mometasone-formoterol (DULERA) 100-5 MCG/ACT AERO Inhale 2 puffs into the lungs in the morning and at bedtime. Take 2 puffs first thing in am and then another 2 puffs about 12 hours later.  Marland Kitchen oxyCODONE-acetaminophen (PERCOCET/ROXICET) 5-325 MG tablet TAKE 1 TABLET BY MOUTH EVERY 8 HOURS AS NEEDED FOR SEVERE pain  . oxyCODONE-acetaminophen (PERCOCET/ROXICET) 5-325 MG tablet TAKE 1 TABLET BY MOUTH EVERY 8 HOURS prrn FOR SEVERE pain FOR pain  . oxyCODONE-acetaminophen (PERCOCET/ROXICET) 5-325 MG tablet TAKE 1 TABLET BY MOUTH EVERY 8 HOURS AS NEEDED FOR SEVERE PAIN  . pantoprazole (PROTONIX) 40 MG tablet TAKE 1 TABLET BY MOUTH DAILY  . promethazine (PHENERGAN) 12.5 MG tablet Take 1 tablet (12.5 mg total) by mouth every 8 (eight) hours as needed for up to 7 days for nausea or vomiting.  Marland Kitchen QUEtiapine (SEROQUEL) 25 MG tablet Take 25 mg by mouth at bedtime.  Marland Kitchen Respiratory Therapy Supplies (FLUTTER) DEVI Use as  directed  . SUMAtriptan (IMITREX) 100 MG tablet TAKE 1 TABLET BY MOUTH EVERY 2 HOURS AS NEEDED FOR MIGRAINE - MAY REPEAT in TWO hours if HEADACHE persists OR recurs  .  triamcinolone ointment (KENALOG) 0.1 % APPLY TO THE AFFECTED AREA(S) TWICE DAILY                                   Past Medical History:  Diagnosis Date  . Anxiety   . Bronchiectasis ([place])   . Chronic back pain   . Chronic neck pain   . Depression    bipolar- Dr Melody [name]  . Fatigue   . Fibromyalgia   . GERD (gastroesophageal reflux disease)   . Headache   . Hypothyroidism   . Low back pain    Dr Arnoldo Morale  . 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([place])   . Osteoarthritis   . Pneumonia   . Pulmonary nodule   . Sinus congestion   . Thyroid disease    hypothyroidsm  . Vertigo   . Vitamin B12 deficiency        Objective:      01/18/2021       131 08/31/2020       133  06/20/20 129 lb (58.5 kg)  03/31/20 129 lb (58.5 kg)  03/15/20 130 lb (59 kg)     Vital signs reviewed  01/18/2021  - Note at rest 02 sats  99% on RA   General appearance:   amb anxious wf/ confused with details of [name], does not remember any her action plan from last ov (given written copy also)    HEENT : pt wearing mask not removed for exam due to covid - 19 concerns.   NECK :  without JVD/Nodes/TM/ nl carotid upstrokes bilaterally   LUNGS: no acc muscle use,  Min barrel  contour chest wall with bilateral  slightly decreased bs s audible wheeze and  without cough on insp or exp maneuvers and min  Hyperresonant  to  percussion bilaterally     CV:  RRR  no s3 or murmur or increase in P2, and no edema   ABD:  soft and nontender with pos end  insp Hoover's  in the supine position. No bruits or organomegaly appreciated, bowel sounds nl  MS:   Nl gait/  ext warm without deformities, calf tenderness, cyanosis or clubbing No obvious joint restrictions   SKIN: warm and dry without lesions    NEURO:  alert, approp, nl sensorium  with  no motor or cerebellar deficits apparent.        CXR PA and Lateral:   01/18/2021 :    I personally reviewed images and agree with radiology impression as follows:    some areas of nodularity a bit worse, some better / mod copd           Assessment

## 2021-01-18 NOTE — Patient Instructions (Addendum)
I very strongly recommend you get the 3rd  moderna vaccine as soon as possible based on your risk of dying from the virus  and the proven safety and benefit of these vaccines against even the delta and omicron variants.     For nasty mucus >   Levaquin 500 mg daily x 7 days  For cough/ congestion > mucinex dm 1200 mg every 12 hours as needed with flutter valve as much as possible   For thrush > clotrimazole troche 10 mg up to 5 x daily   After using dulera,   try arm and hammer toothpaste, make a slurry/ gargle   I will be referring you to a sinus specialist for your nasal congestion   Please remember to go to the [facility]-ray department at [facility]   for your tests - we will call you with the results when they are available.      Please schedule a follow up visit in 12  months but call sooner if needed

## 2021-01-18 NOTE — Assessment & Plan Note (Signed)
Referred to ENT 01/18/2021 >>>  - 03/2017  Allergy profile gE: 15 RAST panel: Negative  She  Has already used all of the usual meds s success and unimpressive allergy panel so next step is ent eval

## 2021-01-19 ENCOUNTER — Encounter: Payer: Self-pay | Admitting: Internal Medicine

## 2021-01-19 NOTE — Assessment & Plan Note (Signed)
Quit smoking 2012  - 03/15/2020  Very poor hfa but cough / throat irritation from BREO > try dulera 100 2bid   > improved  - 01/18/2021  After extensive coaching inhaler device,  effectiveness =    90%   Adequate control on present rx, reviewed in detail with pt > no change in rx needed    Action plan again reviewed;  Re saba: I spent extra time with pt today reviewing appropriate use of albuterol for prn use on exertion with the following points: 1) saba is for relief of sob that does not improve by walking a slower pace or resting but rather if the pt does not improve after trying this first. 2) If the pt is convinced, as many are, that saba helps recover from activity faster then it's easy to tell if this is the case by re-challenging : ie stop, take the inhaler, then p 5 minutes try the exact same activity (intensity of workload) that just caused the symptoms and see if they are substantially diminished or not after saba 3) if there is an activity that reproducibly causes the symptoms, try the saba 15 min before the activity on alternate days   If in fact the saba really does help, then fine to continue to use it prn but advised may need to look closer at the maintenance regimen being used to achieve better control of airways disease with exertion.

## 2021-01-19 NOTE — Assessment & Plan Note (Signed)
See CT chest 07/22/17 -  08/18/17  MAI isolated from sputum, sensitive to Biaxin  > 06/20/2020 rec zpak prn purulent sputum  -   08/13/20 reported refractory green mucus so changed to levaquin and cleared it up  -  rec for flares rx levaquin 500 x 7 days   No purulent mucus, no evolving changes on cxr and cough now likely due to rhinitis/ pnds and not flare of MAC > no change rx needed.  Discussed in detail all the  indications, usual  risks and alternatives  relative to the benefits with patient who agrees to proceed with Rx as outlined.             Each maintenance medication was reviewed in detail including emphasizing most importantly the difference between maintenance and prns and under what circumstances the prns are to be triggered using an action plan format where appropriate.  Total time for H and P, chart review, counseling, reviewing hfa device(s) and generating customized AVS unique to this office visit / same day charting > 30 min      hfa

## 2021-01-20 ENCOUNTER — Encounter: Payer: Self-pay | Admitting: *Deleted

## 2021-02-03 ENCOUNTER — Telehealth: Payer: Self-pay | Admitting: Internal Medicine

## 2021-02-03 NOTE — Telephone Encounter (Signed)
Patient called and said that her pharmacist wants to speak with Dr. Alain Marion about  Budesonide nasal irrigation. She said that the pharmacist can be reached at [phone]. She said that his name is Laurey Arrow. Please advise

## 2021-02-09 NOTE — Telephone Encounter (Signed)
Patient called and is requesting a call back in regards to the patient using Budesonide nasal irrigation. She can be reached at [phone]

## 2021-02-09 NOTE — Telephone Encounter (Signed)
Called pt she sttes her pharmacist advise her to get the Budesonide nasal Irrigation since she is taking a lot of sinus medications. She states rx can be sent to [facility].Marland KitchenJohny Chess

## 2021-02-12 NOTE — Telephone Encounter (Signed)
I never prescribed budesonide for nasal irrigation.  I do not have this done.  Her pharmacist can send me a prescription/instructions to sign. Thanks

## 2021-02-13 NOTE — Telephone Encounter (Signed)
Notified pt w/MD response.../lmb 

## 2021-02-19 ENCOUNTER — Other Ambulatory Visit: Payer: Self-pay | Admitting: Internal Medicine

## 2021-02-19 DIAGNOSIS — J449 Chronic obstructive pulmonary disease, unspecified: Secondary | ICD-10-CM

## 2021-04-01 ENCOUNTER — Other Ambulatory Visit: Payer: Self-pay | Admitting: Internal Medicine

## 2021-04-10 ENCOUNTER — Encounter: Payer: Self-pay | Admitting: Internal Medicine

## 2021-04-10 ENCOUNTER — Ambulatory Visit (INDEPENDENT_AMBULATORY_CARE_PROVIDER_SITE_OTHER): Payer: Medicare Other | Admitting: Internal Medicine

## 2021-04-10 ENCOUNTER — Other Ambulatory Visit: Payer: Self-pay

## 2021-04-10 DIAGNOSIS — R202 Paresthesia of skin: Secondary | ICD-10-CM

## 2021-04-10 DIAGNOSIS — Z0000 Encounter for general adult medical examination without abnormal findings: Secondary | ICD-10-CM | POA: Diagnosis not present

## 2021-04-10 DIAGNOSIS — I700 Atherosclerosis of aorta: Secondary | ICD-10-CM | POA: Diagnosis not present

## 2021-04-10 DIAGNOSIS — J069 Acute upper respiratory infection, unspecified: Secondary | ICD-10-CM | POA: Diagnosis not present

## 2021-04-10 DIAGNOSIS — F419 Anxiety disorder, unspecified: Secondary | ICD-10-CM

## 2021-04-10 NOTE — Progress Notes (Signed)
Virtual Visit via Telephone Note  I connected with Melody [name] on 04/10/21 at  2:20 PM EDT by telephone and verified that I am speaking with the correct person using two identifiers.  Location: Patient - home Provider: [place] office No one else is present    I discussed the limitations, risks, security and privacy concerns of performing an evaluation and management service by telephone and the availability of in person appointments. I also discussed with the patient that there may be a patient responsible charge related to this service. The patient expressed understanding and agreed to proceed.   History of Present Illness:   C/o stress, LBP, anxiety   C/o cough, T 99.12F for 4-5 d   Observations/Objective:  NAD, anxious  Assessment and Plan: See plan  Follow Up Instructions:    I discussed the assessment and treatment plan with the patient. The patient was provided an opportunity to ask questions and all were answered. The patient agreed with the plan and demonstrated an understanding of the instructions.   The patient was advised to call back or seek an in-person evaluation if the symptoms worsen or if the condition fails to improve as anticipated.  I provided 15 minutes of non-face-to-face time during this encounter.   Walker Kehr, MD

## 2021-04-10 NOTE — Assessment & Plan Note (Signed)
New:  R side of the tongue and R lips yesterday off and on - "tingling" - no sx's today ASA 81 mg/d Carotid Duplex US

## 2021-04-10 NOTE — Progress Notes (Deleted)
I called x 4 - no reply; VM is not set up  Subjective:  Patient ID: Melody [name], female    DOB: [birth year]  Age: 65 y.o. MRN: [id number]  CC: Follow-up (3 month f/u)   HPI Melody [name] presents for a well exam  C/o R side of the tongue and R lips yesterday off and on - "tingling" - no sx's today  Outpatient Medications Prior to Visit  Medication Sig Dispense Refill   albuterol (VENTOLIN HFA) 108 (90 Base) MCG/ACT inhaler INHALE TWO PUFFS INTO LUNGS EVERY 6 HOURS AS NEEDED FOR WHEEZING OR SHORTNESS OF BREATH 8.5 g 5   ALPRAZolam (XANAX) 1 MG tablet Take 1 mg by mouth 4 (four) times daily.     Azelastine-Fluticasone (DYMISTA) 137-50 MCG/ACT SUSP Place 1 spray 2 (two) times daily into the nose. 1 Bottle 3   ciclopirox (PENLAC) 8 % solution Apply topically at bedtime. Apply over nail and surrounding skin. Apply daily over previous coat. After seven (7) days, may remove with alcohol and continue cycle. 6.6 mL 0   clotrimazole (MYCELEX) 10 MG troche Take 1 tablet (10 mg total) by mouth 5 (five) times daily. 35 Troche 11   cyanocobalamin (,VITAMIN B-12,) 1000 MCG/ML injection INJECT 1ML INTO THE SKIN EVERY 14 DAYS 6 mL 3   DULERA 100-5 MCG/ACT AERO inhale TWO puffs first thing in THE morning THEN another TWO puffs 12 hours LATER 13 g 11   fluticasone (FLONASE) 50 MCG/ACT nasal spray INSTILL TWO SPRAYS IN EACH NOSTRIL DAILY 48 g 3   levocetirizine (XYZAL) 5 MG tablet TAKE 1 TABLET BY MOUTH EVERY EVENING 90 tablet 3   levofloxacin (LEVAQUIN) 500 MG tablet Take 1 tablet (500 mg total) by mouth daily. 7 tablet 11   levothyroxine (SYNTHROID) 50 MCG tablet TAKE 1 TABLET BY MOUTH DAILY BEFORE BREAKfast 90 tablet 3   methylphenidate (RITALIN) 20 MG tablet Take 1 tablet (20 mg total) by mouth daily.  0   oxyCODONE-acetaminophen (PERCOCET/ROXICET) 5-325 MG tablet TAKE 1 TABLET BY MOUTH EVERY 8 HOURS AS NEEDED FOR SEVERE pain 90 tablet 0   oxyCODONE-acetaminophen (PERCOCET/ROXICET) 5-325 MG tablet TAKE  1 TABLET BY MOUTH EVERY 8 HOURS prrn FOR SEVERE pain FOR pain 90 tablet 0   oxyCODONE-acetaminophen (PERCOCET/ROXICET) 5-325 MG tablet TAKE 1 TABLET BY MOUTH EVERY 8 HOURS AS NEEDED FOR SEVERE PAIN 90 tablet 0   pantoprazole (PROTONIX) 40 MG tablet TAKE 1 TABLET BY MOUTH DAILY 90 tablet 3   promethazine (PHENERGAN) 12.5 MG tablet Take 1 tablet (12.5 mg total) by mouth every 8 (eight) hours as needed for up to 7 days for nausea or vomiting. 24 tablet 1   QUEtiapine (SEROQUEL) 25 MG tablet Take 25 mg by mouth at bedtime.     Respiratory Therapy Supplies (FLUTTER) DEVI Use as directed 1 each 0   SUMAtriptan (IMITREX) 100 MG tablet TAKE 1 TABLET BY MOUTH EVERY 2 HOURS AS NEEDED FOR MIGRAINE - MAY REPEAT in TWO hours if HEADACHE persists OR recurs 10 tablet 2   triamcinolone ointment (KENALOG) 0.1 % APPLY TO THE AFFECTED AREA(S) TWICE DAILY 80 g 3   No facility-administered medications prior to visit.    ROS: Review of Systems  Constitutional:  Negative for activity change, appetite change, chills, fatigue and unexpected weight change.  HENT:  Negative for congestion, mouth sores and sinus pressure.   Eyes:  Negative for visual disturbance.  Respiratory:  Negative for cough and chest tightness.   Gastrointestinal:  Negative  for abdominal pain and nausea.  Genitourinary:  Negative for difficulty urinating, frequency and vaginal pain.  Musculoskeletal:  Negative for back pain and gait problem.  Skin:  Negative for pallor and rash.  Neurological:  Positive for numbness. Negative for dizziness, tremors, weakness and headaches.  Psychiatric/Behavioral:  Negative for confusion and sleep disturbance.    Objective:  LMP 10/29/2006 (Approximate)   BP Readings from Last 3 Encounters:  01/18/21 128/80  10/06/20 138/80  10/06/20 138/80    Wt Readings from Last 3 Encounters:  01/18/21 131 lb (59.4 kg)  10/06/20 135 lb 12.9 oz (61.6 kg)  10/06/20 135 lb 12.8 oz (61.6 kg)    Physical  Exam Constitutional:      General: She is not in acute distress.    Appearance: She is well-developed.  HENT:     Head: Normocephalic.     Right Ear: External ear normal.     Left Ear: External ear normal.     Nose: Nose normal.  Eyes:     General:        Right eye: No discharge.        Left eye: No discharge.     Conjunctiva/sclera: Conjunctivae normal.     Pupils: Pupils are equal, round, and reactive to [place].  Neck:     Thyroid: No thyromegaly.     Vascular: No JVD.     Trachea: No tracheal deviation.  Cardiovascular:     Rate and Rhythm: Normal rate and regular rhythm.     Heart sounds: Normal heart sounds.  Pulmonary:     Effort: No respiratory distress.     Breath sounds: No stridor. No wheezing.  Abdominal:     General: Bowel sounds are normal. There is no distension.     Palpations: Abdomen is soft. There is no mass.     Tenderness: There is no abdominal tenderness. There is no guarding or rebound.  Musculoskeletal:        General: No tenderness.     Cervical back: Normal range of motion and neck supple. No rigidity.  Lymphadenopathy:     Cervical: No cervical adenopathy.  Skin:    Findings: No erythema or rash.  Neurological:     Mental Status: She is oriented to person, place, and time.     Cranial Nerves: No cranial nerve deficit.     Motor: No abnormal muscle tone.     Coordination: Coordination normal.     Deep Tendon Reflexes: Reflexes normal.  Psychiatric:        Behavior: Behavior normal.        Thought Content: Thought content normal.        Judgment: Judgment normal.    Lab Results  Component Value Date   WBC 6.3 07/06/2020   HGB 13.5 07/06/2020   HCT 40.7 07/06/2020   PLT 168 07/06/2020   GLUCOSE 76 07/06/2020   CHOL 180 05/16/2018   TRIG 67.0 05/16/2018   HDL 69.80 05/16/2018   LDLCALC 96 05/16/2018   ALT 7 07/06/2020   AST 12 07/06/2020   NA 142 07/06/2020   K 3.7 07/06/2020   CL 103 07/06/2020   CREATININE 0.91 07/06/2020   BUN 9  07/06/2020   CO2 30 07/06/2020   TSH 3.21 07/06/2020    DG Chest 2 View  Result Date: 01/20/2021 CLINICAL DATA:  65 year old female with history of bronchiectasis. Follow-up for nodules noted on prior CT examination. EXAM: CHEST - 2 VIEW COMPARISON:  Chest x-ray 01/14/2020.  FINDINGS: There continues to be some clustered micronodules in the lungs bilaterally, most severe in the right mid lung, very similar to the prior examination. Lung volumes are normal. Chronic lingular scarring, similar to prior studies. No consolidative airspace disease. No pleural effusions. No pneumothorax. Pulmonary vasculature and the cardiomediastinal silhouette are within normal limits. IMPRESSION: 1. 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 Electronically Signed   By: Vinnie Langton M.D.   On: 01/20/2021 09:43    Assessment & Plan:   There are no diagnoses linked to this encounter.   No orders of the defined types were placed in this encounter.    Follow-up: No follow-ups on file.  Walker Kehr, MD

## 2021-04-10 NOTE — Assessment & Plan Note (Signed)
On diet, exercise 

## 2021-04-10 NOTE — Assessment & Plan Note (Signed)
On Levaquin since 04/07/21 Do a COVID test today

## 2021-04-10 NOTE — Assessment & Plan Note (Signed)
Stress discussed 

## 2021-05-21 ENCOUNTER — Encounter (HOSPITAL_COMMUNITY): Payer: Self-pay | Admitting: *Deleted

## 2021-05-21 ENCOUNTER — Ambulatory Visit (HOSPITAL_COMMUNITY)
Admission: EM | Admit: 2021-05-21 | Discharge: 2021-05-21 | Disposition: A | Discharge status: Home / Self Care | Payer: Medicare Other

## 2021-05-21 ENCOUNTER — Other Ambulatory Visit: Payer: Self-pay

## 2021-05-21 DIAGNOSIS — R519 Headache, unspecified: Secondary | ICD-10-CM | POA: Diagnosis not present

## 2021-05-21 NOTE — ED Provider Notes (Signed)
[place]    CSN: [id number] Arrival date & time: 05/21/21  1334      History   Chief Complaint Chief Complaint  Patient presents with   Emesis   Abdominal Pain   Headache    HPI Melody [name] is a 65 y.o. female.   Patient here for evaluation of headache, nausea vomiting, and abdominal pain.  Reports taking pain medications with no relief.  Denies any history of migraines.  Reports headache to right side of her head.  States headache is worse headache of her life. Denies any trauma, injury, or other precipitating event.  Denies any specific alleviating or aggravating factors.  Denies any fevers, chest pain, shortness of breath, numbness, tingling, weakness.     The history is provided by the patient.  Emesis Associated symptoms: abdominal pain and headaches   Abdominal Pain Associated symptoms: vomiting   Headache Associated symptoms: abdominal pain and vomiting    Past Medical History:  Diagnosis Date   Anxiety    Bronchiectasis (HCC)    Chronic back pain    Chronic neck pain    Depression    bipolar- Dr [name]   Fatigue    Fibromyalgia    GERD (gastroesophageal reflux disease)    Headache    Hypothyroidism    Low back pain    Dr Arnoldo Morale   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[facility])    Osteoarthritis    Pneumonia    Pulmonary nodule    Sinus congestion    Thyroid disease    hypothyroidsm   Vertigo    Vitamin B12 deficiency     Patient Active Problem List   Diagnosis Date Noted   Atherosclerosis of aorta ([place]) 04/10/2021   Chronic sinusitis 01/18/2021   Ingrown toenail 03/31/2020   Vertigo 12/30/2019   Palpitations 12/30/2019   Stress at home 09/17/2019   Recurrent oral herpes simplex 08/29/2019   Biceps tendonitis 12/10/2018   Vaginitis 11/17/2018   Shoulder pain 11/17/2018   Hypokalemia    HCAP (healthcare-associated pneumonia)    Thrombocytopenia ([place]) 10/01/2018   Meteorism 05/16/2018   MAI (mycobacterium  avium-intracellulare) ([place]) 10/16/2017   Bursitis 05/21/2017   Knee pain, chronic 05/21/2017   Cough 04/04/2017   Multiple lung nodules on CT assoc with cylindrical bronchiectasis  04/04/2017   Change in voice 04/04/2017   Fatigue 02/08/2016   Complete uterovaginal prolapse 01/10/2016   Status post total abdominal hysterectomy and bilateral salpingo-oophorectomy 01/10/2016   Urinary incontinence 11/09/2015   Vaginal discomfort 08/10/2015   Rectocele 08/10/2015   Well adult exam 09/03/2014   COPD GOLD I  09/03/2014   UTI (urinary tract infection) 06/09/2014   Dysplastic toenail 09/02/2013   Aphthous ulcer 06/11/2013   Dark urine 03/18/2013   Rash 06/08/2012   Contact dermatitis 04/13/2011   PRURITUS 12/01/2010   EXPOSURE TO HAZARDOUS BODY FLUID, HX OF 05/29/2010   HIP PAIN 12/05/2009   RUQ PAIN 02/04/2009   WRIST PAIN 05/24/2008   CERVICAL STRAIN 05/24/2008   WARTS, VIRAL, UNSPECIFIED 04/06/2008   NEOPLASM, SKIN, UNCERTAIN BEHAVIOR 0000000   Vitamin D deficiency 11/13/2007   URI (upper respiratory infection) 11/11/2007   Hypothyroidism 08/05/2007   Bipolar I disorder ([place]) 08/05/2007   Anxiety disorder 07/23/2007   Low back pain 07/23/2007   Myalgia and myositis 07/23/2007   B12 deficiency 05/22/2007   IRON DEFICIENCY 05/22/2007    Past Surgical History:  Procedure Laterality Date   ABDOMINAL HYSTERECTOMY N/A 01/10/2016   Procedure: HYSTERECTOMY ABDOMINAL;  Surgeon:  Brook Oletta Lamas, MD;  Location: [facility];  Service: Gynecology;  Laterality: N/A;   ABDOMINAL SACROCOLPOPEXY N/A 01/10/2016   Procedure: ABDOMINO SACROCOLPOPEXY ;  Surgeon: Nunzio Cobbs, MD;  Location: [facility];  Service: Gynecology;  Laterality: N/A;   ANTERIOR AND POSTERIOR REPAIR N/A 01/10/2016   Procedure:  POSTERIOR REPAIR (RECTOCELE);  Surgeon: Nunzio Cobbs, MD;  Location: [facility];  Service: Gynecology;  Laterality: N/A;   BLADDER SUSPENSION N/A 01/10/2016   Procedure:  TRANSVAGINAL TAPE (TVT) PROCEDURE exact midurethral sling;  Surgeon: Nunzio Cobbs, MD;  Location: [facility];  Service: Gynecology;  Laterality: N/A;   CERVICAL LAMINECTOMY  2001 and 2995   Dr Harmon Pier N/A 01/10/2016   Procedure: CYSTOSCOPY;  Surgeon: Nunzio Cobbs, MD;  Location: [facility];  Service: Gynecology;  Laterality: N/A;   SALPINGOOPHORECTOMY Bilateral 01/10/2016   Procedure: BILATERAL SALPINGO OOPHORECTOMY;  Surgeon: Nunzio Cobbs, MD;  Location: [facility];  Service: Gynecology;  Laterality: Bilateral;   TUBAL LIGATION  1980    OB History     Gravida  2   Para  2   Term  2   Preterm      AB      Living  2      SAB      IAB      Ectopic      Multiple      Live Births               Home Medications    Prior to Admission medications   Medication Sig Start Date End Date Taking? Authorizing Provider  albuterol (VENTOLIN HFA) 108 (90 Base) MCG/ACT inhaler INHALE TWO PUFFS INTO LUNGS EVERY 6 HOURS AS NEEDED FOR WHEEZING OR SHORTNESS OF BREATH 01/28/20   Plotnikov, Evie Lacks, MD  ALPRAZolam Duanne Moron) 1 MG tablet Take 1 mg by mouth 4 (four) times daily.    [provider]  Azelastine-Fluticasone (DYMISTA) 137-50 MCG/ACT SUSP Place 1 spray 2 (two) times daily into the nose. 09/12/17   Javier Glazier, MD  ciclopirox (PENLAC) 8 % solution Apply topically at bedtime. Apply over nail and surrounding skin. Apply daily over previous coat. After seven (7) days, may remove with alcohol and continue cycle. 03/31/20   Plotnikov, Evie Lacks, MD  clotrimazole (MYCELEX) 10 MG troche Take 1 tablet (10 mg total) by mouth 5 (five) times daily. 01/18/21   Tanda Rockers, MD  cyanocobalamin (,VITAMIN B-12,) 1000 MCG/ML injection INJECT 1ML INTO THE SKIN EVERY 14 DAYS 09/26/20   Plotnikov, Evie Lacks, MD  DULERA 100-5 MCG/ACT AERO inhale TWO puffs first thing in THE morning THEN another TWO puffs 12 hours LATER 02/20/21   Tanda Rockers, MD   fluticasone (FLONASE) 50 MCG/ACT nasal spray INSTILL TWO SPRAYS IN EACH NOSTRIL DAILY 09/26/20   Plotnikov, Evie Lacks, MD  levocetirizine (XYZAL) 5 MG tablet TAKE 1 TABLET BY MOUTH EVERY EVENING 09/26/20   Plotnikov, Evie Lacks, MD  levofloxacin (LEVAQUIN) 500 MG tablet Take 1 tablet (500 mg total) by mouth daily. 01/18/21   Tanda Rockers, MD  levothyroxine (SYNTHROID) 50 MCG tablet TAKE 1 TABLET BY MOUTH DAILY BEFORE BREAKfast 09/26/20   Plotnikov, Evie Lacks, MD  methylphenidate (RITALIN) 20 MG tablet Take 1 tablet (20 mg total) by mouth daily. 11/10/18   Johnson, Clanford L, MD  oxyCODONE-acetaminophen (PERCOCET/ROXICET) 5-325 MG tablet TAKE 1 TABLET BY MOUTH EVERY  8 HOURS AS NEEDED FOR SEVERE pain 10/06/20   Plotnikov, Evie Lacks, MD  oxyCODONE-acetaminophen (PERCOCET/ROXICET) 5-325 MG tablet TAKE 1 TABLET BY MOUTH EVERY 8 HOURS prrn FOR SEVERE pain FOR pain 10/06/20   Plotnikov, Evie Lacks, MD  oxyCODONE-acetaminophen (PERCOCET/ROXICET) 5-325 MG tablet TAKE 1 TABLET BY MOUTH EVERY 8 HOURS AS NEEDED FOR SEVERE PAIN 04/03/21   Plotnikov, Evie Lacks, MD  pantoprazole (PROTONIX) 40 MG tablet TAKE 1 TABLET BY MOUTH DAILY 09/26/20   Plotnikov, Evie Lacks, MD  promethazine (PHENERGAN) 12.5 MG tablet Take 1 tablet (12.5 mg total) by mouth every 8 (eight) hours as needed for up to 7 days for nausea or vomiting. 10/06/20 10/13/20  Plotnikov, Evie Lacks, MD  QUEtiapine (SEROQUEL) 25 MG tablet Take 25 mg by mouth at bedtime.    [provider]  Respiratory Therapy Supplies (FLUTTER) DEVI Use as directed 05/21/17   Javier Glazier, MD  SUMAtriptan (IMITREX) 100 MG tablet TAKE 1 TABLET BY MOUTH EVERY 2 HOURS AS NEEDED FOR MIGRAINE - MAY REPEAT in TWO hours if HEADACHE persists OR recurs 12/29/19   Plotnikov, Evie Lacks, MD  triamcinolone ointment (KENALOG) 0.1 % APPLY TO THE AFFECTED AREA(S) TWICE DAILY 01/09/21   Plotnikov, Evie Lacks, MD    Family History Family History  Problem Relation Age of Onset   Heart  attack Father        dec heart disease age 22   Hypertension Father    Lung disease Father    Cancer Mother        dec stomach ca--age 37   Breast cancer Cousin    Arthritis Other    Breast cancer Paternal Aunt     Social History Social History   Tobacco Use   Smoking status: Former    Packs/day: 1.00    Years: 35.00    Pack years: 35.00    Types: Cigarettes    Start date: 11/21/1962    Quit date: 05/06/2011    Years since quitting: 10.0   Smokeless tobacco: Never   Tobacco comments:    stopped periodically - peak rate of 1 ppd  Vaping Use   Vaping Use: Never used  Substance Use Topics   Alcohol use: No    Alcohol/week: 0.0 standard drinks   Drug use: No     Allergies   Flagyl [metronidazole], Doxycycline hyclate, Gabapentin, Nabumetone, Sulfa antibiotics, Tetracycline hcl, and Tramadol hcl   Review of Systems Review of Systems  Gastrointestinal:  Positive for abdominal pain and vomiting.  Neurological:  Positive for headaches.  All other systems reviewed and are negative.   Physical Exam Triage Vital Signs ED Triage Vitals  Enc Vitals Group     BP 05/21/21 1445 130/75     Pulse Rate 05/21/21 1445 71     Resp 05/21/21 1445 20     Temp 05/21/21 1445 98.7 F (37.1 C)     Temp Source 05/21/21 1445 Oral     SpO2 05/21/21 1445 100 %     Weight --      Height --      Head Circumference --      Peak Flow --      Pain Score 05/21/21 1446 10     Pain Loc --      Pain Edu? --      Excl. in [place]? --    No data found.  Updated Vital Signs BP 130/75   Pulse 71   Temp 98.7 F (37.1 C) (Oral)  Resp 20   LMP 10/29/2006 (Approximate)   SpO2 100%   Visual Acuity Right Eye Distance:   Left Eye Distance:   Bilateral Distance:    Right Eye Near:   Left Eye Near:    Bilateral Near:     Physical Exam   UC Treatments / Results  Labs (all labs ordered are listed, but only abnormal results are displayed) Labs Reviewed - No data to  display  EKG   Radiology No results found.  Procedures Procedures (including critical care time)  Medications Ordered in UC Medications - No data to display  Initial Impression / Assessment and Plan / UC Course  I have reviewed the triage vital signs and the nursing notes.  Pertinent labs & imaging results that were available during my care of the patient were reviewed by me and considered in my medical decision making (see chart for details).    As patient reports this is the worst headache of her life it was recommended that she go to the emergency room for further evaluation and possible CT scan to rule out intracranial abnormalities.  Patient may provide to ED via private vehicle.   Final Clinical Impressions(s) / UC Diagnoses   Final diagnoses:  Acute intractable headache, unspecified headache type   Discharge Instructions   None    ED Prescriptions   None    PDMP not reviewed this encounter.   Pearson Forster, NP 05/21/21 [phone]

## 2021-05-21 NOTE — ED Triage Notes (Signed)
PT reports HA for 2 days with N/V and ABD pain.

## 2021-05-22 ENCOUNTER — Telehealth: Payer: Self-pay | Admitting: Internal Medicine

## 2021-05-22 NOTE — Telephone Encounter (Signed)
[facility] FYI 7.24.22:  ---Caller states she has a bad headache in the crown of her head. Hasn't felt well since dinner last night. Feels nauseous. No fever. Headache has been there for two days, claims she has sinus problems  Advised to go to ED now

## 2021-06-01 ENCOUNTER — Ambulatory Visit: Payer: Medicare Other | Admitting: Internal Medicine

## 2021-06-08 ENCOUNTER — Ambulatory Visit (INDEPENDENT_AMBULATORY_CARE_PROVIDER_SITE_OTHER): Payer: Medicare Other | Admitting: Internal Medicine

## 2021-06-08 ENCOUNTER — Encounter: Payer: Self-pay | Admitting: Internal Medicine

## 2021-06-08 ENCOUNTER — Other Ambulatory Visit: Payer: Self-pay

## 2021-06-08 DIAGNOSIS — G8929 Other chronic pain: Secondary | ICD-10-CM | POA: Diagnosis not present

## 2021-06-08 DIAGNOSIS — R5383 Other fatigue: Secondary | ICD-10-CM | POA: Diagnosis not present

## 2021-06-08 DIAGNOSIS — F419 Anxiety disorder, unspecified: Secondary | ICD-10-CM | POA: Diagnosis not present

## 2021-06-08 DIAGNOSIS — I700 Atherosclerosis of aorta: Secondary | ICD-10-CM | POA: Diagnosis not present

## 2021-06-08 DIAGNOSIS — M5440 Lumbago with sciatica, unspecified side: Secondary | ICD-10-CM | POA: Diagnosis not present

## 2021-06-08 DIAGNOSIS — G43909 Migraine, unspecified, not intractable, without status migrainosus: Secondary | ICD-10-CM | POA: Insufficient documentation

## 2021-06-08 DIAGNOSIS — G43019 Migraine without aura, intractable, without status migrainosus: Secondary | ICD-10-CM | POA: Diagnosis not present

## 2021-06-08 MED ORDER — LEVOTHYROXINE SODIUM 50 MCG PO TABS: 50.0000 ug | ORAL_TABLET | Freq: Every day | ORAL | 3 refills | Status: DC

## 2021-06-08 MED ORDER — RIZATRIPTAN BENZOATE 10 MG PO TABS: 10.0000 mg | ORAL_TABLET | Freq: Once | ORAL | 5 refills | Status: DC | PRN

## 2021-06-08 MED ORDER — ALBUTEROL SULFATE HFA 108 (90 BASE) MCG/ACT IN AERS: 2.0000 | INHALATION_SPRAY | RESPIRATORY_TRACT | 5 refills | Status: DC | PRN

## 2021-06-08 NOTE — Assessment & Plan Note (Addendum)
Dr [name] Seroquel, Xanax prn  Potential benefits of a long term benzodiazepines  use as well as potential risks  and complications were explained to the patient and were aknowledged. Melody [name] continues to have a lot of stress with her situation at home, her husband, her house and her son.  She spends a fair amount of time at her cousin's house in [place].  This is a relief.

## 2021-06-08 NOTE — Progress Notes (Signed)
Subjective:  Patient ID: Melody [name], female    DOB: 03-31-1956  Age: 65 y.o. MRN: [place]:[id number]  CC: Follow-up (3 month f/u) and Headache (Pt states a week ago she had a pain in the back her head.. still have some discomfort. Went to [facility] but they was not able to do anything)   HPI Melody [name] presents for HAs, LBP, OA, depression C/o severe HA w/vomiting on 7/24 - pt went to ER - Imitrex helped. Onye continues to have a lot of stress with her situation at home, her husband, her house and her son.  She spends a fair amount of time at her cousin's house in [place].  This is a relief.  Outpatient Medications Prior to Visit  Medication Sig Dispense Refill   albuterol (VENTOLIN HFA) 108 (90 Base) MCG/ACT inhaler INHALE TWO PUFFS INTO LUNGS EVERY 6 HOURS AS NEEDED FOR WHEEZING OR SHORTNESS OF BREATH 8.5 g 5   ALPRAZolam (XANAX) 1 MG tablet Take 1 mg by mouth 4 (four) times daily.     Azelastine-Fluticasone (DYMISTA) 137-50 MCG/ACT SUSP Place 1 spray 2 (two) times daily into the nose. 1 Bottle 3   clotrimazole (MYCELEX) 10 MG troche Take 1 tablet (10 mg total) by mouth 5 (five) times daily. 35 Troche 11   cyanocobalamin (,VITAMIN B-12,) 1000 MCG/ML injection INJECT 1ML INTO THE SKIN EVERY 14 DAYS 6 mL 3   DULERA 100-5 MCG/ACT AERO inhale TWO puffs first thing in THE morning THEN another TWO puffs 12 hours LATER 13 g 11   fluticasone (FLONASE) 50 MCG/ACT nasal spray INSTILL TWO SPRAYS IN EACH NOSTRIL DAILY 48 g 3   levocetirizine (XYZAL) 5 MG tablet TAKE 1 TABLET BY MOUTH EVERY EVENING 90 tablet 3   levothyroxine (SYNTHROID) 50 MCG tablet TAKE 1 TABLET BY MOUTH DAILY BEFORE BREAKfast 90 tablet 3   methylphenidate (RITALIN) 20 MG tablet Take 1 tablet (20 mg total) by mouth daily.  0   oxyCODONE-acetaminophen (PERCOCET/ROXICET) 5-325 MG tablet TAKE 1 TABLET BY MOUTH EVERY 8 HOURS AS NEEDED FOR SEVERE PAIN 90 tablet 0   pantoprazole (PROTONIX) 40 MG tablet TAKE 1 TABLET BY MOUTH  DAILY 90 tablet 3   QUEtiapine (SEROQUEL) 25 MG tablet Take 25 mg by mouth at bedtime.     Respiratory Therapy Supplies (FLUTTER) DEVI Use as directed 1 each 0   SUMAtriptan (IMITREX) 100 MG tablet TAKE 1 TABLET BY MOUTH EVERY 2 HOURS AS NEEDED FOR MIGRAINE - MAY REPEAT in TWO hours if HEADACHE persists OR recurs 10 tablet 2   triamcinolone ointment (KENALOG) 0.1 % APPLY TO THE AFFECTED AREA(S) TWICE DAILY 80 g 3   ciclopirox (PENLAC) 8 % solution Apply topically at bedtime. Apply over nail and surrounding skin. Apply daily over previous coat. After seven (7) days, may remove with alcohol and continue cycle. (Patient not taking: Reported on 06/08/2021) 6.6 mL 0   levofloxacin (LEVAQUIN) 500 MG tablet Take 1 tablet (500 mg total) by mouth daily. (Patient not taking: Reported on 06/08/2021) 7 tablet 11   oxyCODONE-acetaminophen (PERCOCET/ROXICET) 5-325 MG tablet TAKE 1 TABLET BY MOUTH EVERY 8 HOURS AS NEEDED FOR SEVERE pain 90 tablet 0   oxyCODONE-acetaminophen (PERCOCET/ROXICET) 5-325 MG tablet TAKE 1 TABLET BY MOUTH EVERY 8 HOURS prrn FOR SEVERE pain FOR pain 90 tablet 0   promethazine (PHENERGAN) 12.5 MG tablet Take 1 tablet (12.5 mg total) by mouth every 8 (eight) hours as needed for up to 7 days for nausea or  vomiting. 24 tablet 1   No facility-administered medications prior to visit.    ROS: Review of Systems  Constitutional:  Positive for fatigue. Negative for activity change, appetite change, chills and unexpected weight change.  HENT:  Negative for congestion, mouth sores and sinus pressure.   Eyes:  Negative for visual disturbance.  Respiratory:  Negative for cough and chest tightness.   Gastrointestinal:  Negative for abdominal pain and nausea.  Genitourinary:  Negative for difficulty urinating, frequency and vaginal pain.  Musculoskeletal:  Positive for arthralgias, back pain and neck pain. Negative for gait problem.  Skin:  Negative for pallor and rash.  Neurological:  Positive for  headaches. Negative for dizziness, tremors, weakness and numbness.  Psychiatric/Behavioral:  Positive for decreased concentration and dysphoric mood. Negative for behavioral problems, confusion, self-injury, sleep disturbance and suicidal ideas. The patient is nervous/anxious.    Objective:  BP (!) 148/82 (BP Location: Left Arm)   Pulse (!) 59   Temp 98.1 F (36.7 C) (Oral)   Ht '5\' 6"'$  (1.676 m)   Wt 132 lb (59.9 kg)   LMP 10/29/2006 (Approximate)   SpO2 97%   BMI 21.31 kg/m   BP Readings from Last 3 Encounters:  06/08/21 (!) 148/82  05/21/21 130/75  01/18/21 128/80    Wt Readings from Last 3 Encounters:  06/08/21 132 lb (59.9 kg)  01/18/21 131 lb (59.4 kg)  10/06/20 135 lb 12.9 oz (61.6 kg)    Physical Exam Constitutional:      General: She is not in acute distress.    Appearance: She is well-developed.  HENT:     Head: Normocephalic.     Right Ear: External ear normal.     Left Ear: External ear normal.     Nose: Nose normal.  Eyes:     General:        Right eye: No discharge.        Left eye: No discharge.     Conjunctiva/sclera: Conjunctivae normal.     Pupils: Pupils are equal, round, and reactive to [place].  Neck:     Thyroid: No thyromegaly.     Vascular: No JVD.     Trachea: No tracheal deviation.  Cardiovascular:     Rate and Rhythm: Normal rate and regular rhythm.     Heart sounds: Normal heart sounds.  Pulmonary:     Effort: No respiratory distress.     Breath sounds: No stridor. No wheezing.  Abdominal:     General: Bowel sounds are normal. There is no distension.     Palpations: Abdomen is soft. There is no mass.     Tenderness: There is no abdominal tenderness. There is no guarding or rebound.  Musculoskeletal:        General: No tenderness.     Cervical back: Normal range of motion and neck supple. No rigidity.  Lymphadenopathy:     Cervical: No cervical adenopathy.  Skin:    Findings: No erythema or rash.  Neurological:     Cranial Nerves:  No cranial nerve deficit.     Motor: No abnormal muscle tone.     Coordination: Coordination normal.     Deep Tendon Reflexes: Reflexes normal.  Psychiatric:        Behavior: Behavior normal.        Thought Content: Thought content normal.        Judgment: Judgment normal.   SKs on face   Lab Results  Component Value Date   WBC 6.3  07/06/2020   HGB 13.5 07/06/2020   HCT 40.7 07/06/2020   PLT 168 07/06/2020   GLUCOSE 76 07/06/2020   CHOL 180 05/16/2018   TRIG 67.0 05/16/2018   HDL 69.80 05/16/2018   LDLCALC 96 05/16/2018   ALT 7 07/06/2020   AST 12 07/06/2020   NA 142 07/06/2020   K 3.7 07/06/2020   CL 103 07/06/2020   CREATININE 0.91 07/06/2020   BUN 9 07/06/2020   CO2 30 07/06/2020   TSH 3.21 07/06/2020    No results found.  Assessment & Plan:     Walker Kehr, MD

## 2021-06-08 NOTE — Assessment & Plan Note (Signed)
Percocet prn  Potential benefits of a long term opioids use as well as potential risks (i.e. addiction risk, apnea etc) and complications (i.e. Somnolence, constipation and others) were explained to the patient and were aknowledged.  Potential benefits of a long term NSAID  use as well as potential risks  and complications were explained to the patient and were aknowledged. 

## 2021-06-08 NOTE — Assessment & Plan Note (Signed)
  On diet  

## 2021-06-08 NOTE — Assessment & Plan Note (Signed)
CFS - Multifactorial Discussed. Rest more

## 2021-06-08 NOTE — Assessment & Plan Note (Addendum)
Recent severe headache, likely triggered by stress.  Try Maxalt prn - Rx given

## 2021-06-13 ENCOUNTER — Other Ambulatory Visit: Payer: Self-pay

## 2021-06-13 DIAGNOSIS — J449 Chronic obstructive pulmonary disease, unspecified: Secondary | ICD-10-CM

## 2021-06-13 DIAGNOSIS — E034 Atrophy of thyroid (acquired): Secondary | ICD-10-CM

## 2021-06-13 DIAGNOSIS — G43019 Migraine without aura, intractable, without status migrainosus: Secondary | ICD-10-CM

## 2021-06-13 MED ORDER — RIZATRIPTAN BENZOATE 10 MG PO TABS: 10.0000 mg | ORAL_TABLET | Freq: Once | ORAL | 5 refills | Status: DC | PRN

## 2021-06-13 MED ORDER — LEVOTHYROXINE SODIUM 50 MCG PO TABS: 50.0000 ug | ORAL_TABLET | Freq: Every day | ORAL | 3 refills | Status: DC

## 2021-06-13 MED ORDER — ALBUTEROL SULFATE HFA 108 (90 BASE) MCG/ACT IN AERS: 2.0000 | INHALATION_SPRAY | RESPIRATORY_TRACT | 5 refills | Status: DC | PRN

## 2021-08-20 ENCOUNTER — Other Ambulatory Visit: Payer: Self-pay | Admitting: Internal Medicine

## 2021-09-11 ENCOUNTER — Ambulatory Visit (INDEPENDENT_AMBULATORY_CARE_PROVIDER_SITE_OTHER): Payer: Medicare Other | Admitting: Internal Medicine

## 2021-09-11 ENCOUNTER — Encounter: Payer: Self-pay | Admitting: Internal Medicine

## 2021-09-11 ENCOUNTER — Other Ambulatory Visit: Payer: Self-pay

## 2021-09-11 DIAGNOSIS — M545 Low back pain, unspecified: Secondary | ICD-10-CM

## 2021-09-11 DIAGNOSIS — L570 Actinic keratosis: Secondary | ICD-10-CM | POA: Diagnosis not present

## 2021-09-11 DIAGNOSIS — R918 Other nonspecific abnormal finding of lung field: Secondary | ICD-10-CM | POA: Diagnosis not present

## 2021-09-11 DIAGNOSIS — F419 Anxiety disorder, unspecified: Secondary | ICD-10-CM

## 2021-09-11 DIAGNOSIS — G8929 Other chronic pain: Secondary | ICD-10-CM | POA: Diagnosis not present

## 2021-09-11 DIAGNOSIS — J449 Chronic obstructive pulmonary disease, unspecified: Secondary | ICD-10-CM | POA: Diagnosis not present

## 2021-09-11 DIAGNOSIS — J069 Acute upper respiratory infection, unspecified: Secondary | ICD-10-CM

## 2021-09-11 NOTE — Assessment & Plan Note (Signed)
F/u w/Dr Melvyn Novas Just finished a Z pack

## 2021-09-11 NOTE — Patient Instructions (Signed)
Get a thumb splint

## 2021-09-11 NOTE — Assessment & Plan Note (Signed)
Cont on Percocet prn   Potential benefits of a long term opioids use as well as potential risks (i.e. addiction risk, apnea etc) and complications (i.e. Somnolence, constipation and others) were explained to the patient and were aknowledged.  Potential benefits of a long term NSAID  use as well as potential risks  and complications were explained to the patient and were aknowledged.

## 2021-09-11 NOTE — Assessment & Plan Note (Addendum)
Cryo x4

## 2021-09-11 NOTE — Progress Notes (Signed)
Subjective:  Patient ID: Melody [name], female    DOB: 1956-10-16  Age: 65 y.o. MRN: [id number]  CC: Follow-up (3 month f/u)   HPI Melody [name] presents for LBP, anxiety/depression, bronchiectases C/o L cheek lesions  Outpatient Medications Prior to Visit  Medication Sig Dispense Refill   albuterol (VENTOLIN HFA) 108 (90 Base) MCG/ACT inhaler Inhale 2 puffs into the lungs every 4 (four) hours as needed for wheezing or shortness of breath. 8.5 g 5   ALPRAZolam (XANAX) 1 MG tablet Take 1 mg by mouth 4 (four) times daily.     Azelastine-Fluticasone (DYMISTA) 137-50 MCG/ACT SUSP Place 1 spray 2 (two) times daily into the nose. 1 Bottle 3   clotrimazole (MYCELEX) 10 MG troche Take 1 tablet (10 mg total) by mouth 5 (five) times daily. 35 Troche 11   cyanocobalamin (,VITAMIN B-12,) 1000 MCG/ML injection INJECT 1ML INTO THE SKIN EVERY 14 DAYS 6 mL 3   DULERA 100-5 MCG/ACT AERO inhale TWO puffs first thing in THE morning THEN another TWO puffs 12 hours LATER 13 g 11   fluticasone (FLONASE) 50 MCG/ACT nasal spray INSTILL TWO SPRAYS IN EACH NOSTRIL DAILY 48 g 3   gabapentin (NEURONTIN) 100 MG capsule Take 100 mg by mouth 4 (four) times daily. Take 100 mg by mouth 4 (four) times daily.     levocetirizine (XYZAL) 5 MG tablet TAKE 1 TABLET BY MOUTH EVERY EVENING 90 tablet 3   levofloxacin (LEVAQUIN) 500 MG tablet Take 500 mg by mouth daily. Take 1 by mouth daily     levothyroxine (SYNTHROID) 50 MCG tablet Take 1 tablet (50 mcg total) by mouth daily before breakfast. 90 tablet 3   methylphenidate (RITALIN) 20 MG tablet Take 1 tablet (20 mg total) by mouth daily.  0   oxyCODONE-acetaminophen (PERCOCET/ROXICET) 5-325 MG tablet TAKE 1 TABLET BY MOUTH EVERY 8 HOURS AS NEEDED FOR SEVERE PAIN 90 tablet 0   pantoprazole (PROTONIX) 40 MG tablet TAKE 1 TABLET BY MOUTH DAILY 90 tablet 3   QUEtiapine (SEROQUEL) 25 MG tablet Take 25 mg by mouth at bedtime.     Respiratory Therapy Supplies (FLUTTER) DEVI Use as  directed 1 each 0   rizatriptan (MAXALT) 10 MG tablet Take 1 tablet (10 mg total) by mouth once as needed for up to 1 dose for migraine. May repeat in 2 hours if needed 12 tablet 5   triamcinolone ointment (KENALOG) 0.1 % APPLY TO THE AFFECTED AREA(S) TWICE DAILY 80 g 3   No facility-administered medications prior to visit.    ROS: Review of Systems  Constitutional:  Negative for activity change, appetite change, chills, fatigue and unexpected weight change.  HENT:  Negative for congestion, mouth sores and sinus pressure.   Eyes:  Negative for visual disturbance.  Respiratory:  Positive for cough. Negative for chest tightness.   Gastrointestinal:  Negative for abdominal pain and nausea.  Genitourinary:  Negative for difficulty urinating, frequency and vaginal pain.  Musculoskeletal:  Positive for back pain. Negative for gait problem.  Skin:  Negative for pallor and rash.  Neurological:  Negative for dizziness, tremors, weakness, numbness and headaches.  Psychiatric/Behavioral:  Positive for dysphoric mood. Negative for confusion, sleep disturbance and suicidal ideas. The patient is nervous/anxious.    Objective:  BP (!) 148/82 (BP Location: Left Arm)   Pulse 64   Temp 98.1 F (36.7 C) (Oral)   Ht 5\' 6"  (1.676 m)   Wt 137 lb 6.4 oz (62.3 kg)   LMP  10/29/2006 (Approximate)   SpO2 97%   BMI 22.18 kg/m   BP Readings from Last 3 Encounters:  09/11/21 (!) 148/82  06/08/21 (!) 148/82  05/21/21 130/75    Wt Readings from Last 3 Encounters:  09/11/21 137 lb 6.4 oz (62.3 kg)  06/08/21 132 lb (59.9 kg)  01/18/21 131 lb (59.4 kg)    Physical Exam Constitutional:      General: She is not in acute distress.    Appearance: She is well-developed.  HENT:     Head: Normocephalic.     Right Ear: External ear normal.     Left Ear: External ear normal.     Nose: Nose normal.  Eyes:     General:        Right eye: No discharge.        Left eye: No discharge.     Conjunctiva/sclera:  Conjunctivae normal.     Pupils: Pupils are equal, round, and reactive to [place].  Neck:     Thyroid: No thyromegaly.     Vascular: No JVD.     Trachea: No tracheal deviation.  Cardiovascular:     Rate and Rhythm: Normal rate and regular rhythm.     Heart sounds: Normal heart sounds.  Pulmonary:     Effort: No respiratory distress.     Breath sounds: No stridor. No wheezing.  Abdominal:     General: Bowel sounds are normal. There is no distension.     Palpations: Abdomen is soft. There is no mass.     Tenderness: There is no abdominal tenderness. There is no guarding or rebound.  Musculoskeletal:        General: Tenderness present.     Cervical back: Normal range of motion and neck supple. No rigidity.  Lymphadenopathy:     Cervical: No cervical adenopathy.  Skin:    Findings: No erythema or rash.  Neurological:     Cranial Nerves: No cranial nerve deficit.     Motor: No abnormal muscle tone.     Coordination: Coordination normal.     Deep Tendon Reflexes: Reflexes normal.  Psychiatric:        Behavior: Behavior normal.        Thought Content: Thought content normal.        Judgment: Judgment normal.  LS w/pain on ROM   Procedure Note :     Procedure : Cryosurgery   Indication:   Actinic keratosis(es)   Risks including unsuccessful procedure , bleeding, infection, bruising, scar, a need for a repeat  procedure and others were explained to the patient in detail as well as the benefits. Informed consent was obtained verbally.    4 lesion(s)  on  L cheek was/were treated with liquid nitrogen on a Q-tip in a usual fasion . Band-Aid was applied and antibiotic ointment was given for a later use.   Tolerated well. Complications none.   Postprocedure instructions :     Keep the wounds clean. You can wash them with liquid soap and water. Pat dry with gauze or a Kleenex tissue  Before applying antibiotic ointment and a Band-Aid.   You need to report immediately  if  any signs  of infection develop.    Lab Results  Component Value Date   WBC 6.3 07/06/2020   HGB 13.5 07/06/2020   HCT 40.7 07/06/2020   PLT 168 07/06/2020   GLUCOSE 76 07/06/2020   CHOL 180 05/16/2018   TRIG 67.0 05/16/2018   HDL 69.80 05/16/2018  LDLCALC 96 05/16/2018   ALT 7 07/06/2020   AST 12 07/06/2020   NA 142 07/06/2020   K 3.7 07/06/2020   CL 103 07/06/2020   CREATININE 0.91 07/06/2020   BUN 9 07/06/2020   CO2 30 07/06/2020   TSH 3.21 07/06/2020    No results found.  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 addiction risk, apnea etc) and complications (i.e. Somnolence, constipation and others) were explained to the patient and were aknowledged.  Potential benefits of a long term NSAID  use as well as potential risks  and complications were explained to the patient and were aknowledged.        URI (upper respiratory infection)    F/u w/Dr Melvyn Novas Just finished a Z pack         No orders of the defined types were placed in this encounter.     Follow-up: Return in about 6 weeks (around 10/23/2021) for a follow-up visit.  Walker Kehr, MD

## 2021-09-11 NOTE — Assessment & Plan Note (Signed)
Cont w/Seroquel, Xanax prn

## 2021-09-20 ENCOUNTER — Other Ambulatory Visit: Payer: Self-pay | Admitting: Internal Medicine

## 2021-09-26 ENCOUNTER — Other Ambulatory Visit: Payer: Self-pay | Admitting: Internal Medicine

## 2021-09-27 ENCOUNTER — Other Ambulatory Visit: Payer: Self-pay | Admitting: Internal Medicine

## 2021-10-25 ENCOUNTER — Ambulatory Visit: Payer: Medicare Other | Admitting: Internal Medicine

## 2021-11-01 DIAGNOSIS — J441 Chronic obstructive pulmonary disease with (acute) exacerbation: Secondary | ICD-10-CM | POA: Diagnosis not present

## 2021-11-01 DIAGNOSIS — J209 Acute bronchitis, unspecified: Secondary | ICD-10-CM | POA: Diagnosis not present

## 2021-11-07 ENCOUNTER — Other Ambulatory Visit: Payer: Self-pay

## 2021-11-07 ENCOUNTER — Encounter: Payer: Self-pay | Admitting: Internal Medicine

## 2021-11-07 ENCOUNTER — Telehealth (INDEPENDENT_AMBULATORY_CARE_PROVIDER_SITE_OTHER): Payer: Medicare Other | Admitting: Internal Medicine

## 2021-11-07 DIAGNOSIS — J209 Acute bronchitis, unspecified: Secondary | ICD-10-CM

## 2021-11-07 DIAGNOSIS — J449 Chronic obstructive pulmonary disease, unspecified: Secondary | ICD-10-CM | POA: Diagnosis not present

## 2021-11-07 MED ORDER — PROMETHAZINE-DM 6.25-15 MG/5ML PO SYRP: 5.0000 mL | ORAL_SOLUTION | Freq: Four times a day (QID) | ORAL | 0 refills | Status: DC | PRN

## 2021-11-07 NOTE — Assessment & Plan Note (Signed)
Melody [name] has finished Z-Pak. She is some better but still coughing up stuff and not able to sleep at night. Start Levaquin 500 mg daily for 7 days Prescribed cough syrup with promethazine.

## 2021-11-07 NOTE — Progress Notes (Signed)
Virtual Visit via Telephone Note  I connected with Melody [name] on 11/22/20 at  9:30 AM EST by telephone and verified that I am speaking with the correct person using two identifiers.  Location:  Persons participating in the virtual visit: patient, provider Patient: home Provider: [facility]   I discussed the limitations, risks, security and privacy concerns of performing an evaluation and management service by telephone and the availability of in person appointments. I also discussed with the patient that there may be a patient responsible charge related to this service. The patient expressed understanding and agreed to proceed.  No chief complaint on file.    History of Present Illness:  Melody [name] tells me that she has been sick for 3 weeks coughing up green sputum.  [facility] and a half 2 shots in the, was prescribed prednisone, Z-Pak and perles for cough.  She is some better but still coughing up stuff and not able to sleep at night.  She filled her past prescription for Levaquin prescribed by Dr. Melvyn Novas.  She is asking me if she needs to take it. Review of Systems  Constitutional:  Positive for chills.  HENT:  Positive for congestion.   Respiratory:  Positive for cough and sputum production. Negative for wheezing.   Cardiovascular:  Positive for chest pain.  Musculoskeletal:  Positive for back pain. Negative for joint pain.  Skin:  Negative for rash.    Observations/Objective:  She sounds tired and congestion on the phone Assessment and Plan:  Problem List Items Addressed This Visit     BRONCHITIS, ACUTE    Suad has finished Z-Pak. She is some better but still coughing up stuff and not able to sleep at night. Start Levaquin 500 mg daily for 7 days Prescribed cough syrup with promethazine.      COPD GOLD I     Finish prednisone pack.      Relevant Medications   promethazine-dextromethorphan (PROMETHAZINE-DM) 6.25-15 MG/5ML syrup     Meds  ordered this encounter  Medications   promethazine-dextromethorphan (PROMETHAZINE-DM) 6.25-15 MG/5ML syrup    Sig: Take 5 mLs by mouth 4 (four) times daily as needed for cough.    Dispense:  240 mL    Refill:  0      Follow Up Instructions:    I discussed the assessment and treatment plan with the patient. The patient was provided an opportunity to ask questions and all were answered. The patient agreed with the plan and demonstrated an understanding of the instructions.   The patient was advised to call back or seek an in-person evaluation if the symptoms worsen or if the condition fails to improve as anticipated.  I provided 21 minutes of non-face-to-face time during this encounter.   Walker Kehr, MD

## 2021-11-07 NOTE — Assessment & Plan Note (Signed)
Finish prednisone pack.

## 2021-11-23 ENCOUNTER — Other Ambulatory Visit: Payer: Self-pay | Admitting: Internal Medicine

## 2021-12-25 ENCOUNTER — Ambulatory Visit (INDEPENDENT_AMBULATORY_CARE_PROVIDER_SITE_OTHER): Payer: Medicare Other | Admitting: Internal Medicine

## 2021-12-25 ENCOUNTER — Other Ambulatory Visit: Payer: Self-pay

## 2021-12-25 ENCOUNTER — Encounter: Payer: Self-pay | Admitting: Internal Medicine

## 2021-12-25 ENCOUNTER — Ambulatory Visit (INDEPENDENT_AMBULATORY_CARE_PROVIDER_SITE_OTHER): Payer: Medicare Other

## 2021-12-25 VITALS — BP 122/78 | HR 70 | Temp 98.5°F | Resp 16 | Ht 66.0 in | Wt 141.0 lb

## 2021-12-25 DIAGNOSIS — R053 Chronic cough: Secondary | ICD-10-CM | POA: Diagnosis not present

## 2021-12-25 DIAGNOSIS — R21 Rash and other nonspecific skin eruption: Secondary | ICD-10-CM

## 2021-12-25 DIAGNOSIS — F419 Anxiety disorder, unspecified: Secondary | ICD-10-CM

## 2021-12-25 DIAGNOSIS — J449 Chronic obstructive pulmonary disease, unspecified: Secondary | ICD-10-CM

## 2021-12-25 DIAGNOSIS — M5440 Lumbago with sciatica, unspecified side: Secondary | ICD-10-CM | POA: Diagnosis not present

## 2021-12-25 DIAGNOSIS — Z0000 Encounter for general adult medical examination without abnormal findings: Secondary | ICD-10-CM | POA: Diagnosis not present

## 2021-12-25 DIAGNOSIS — G8929 Other chronic pain: Secondary | ICD-10-CM

## 2021-12-25 MED ORDER — PROMETHAZINE-DM 6.25-15 MG/5ML PO SYRP: 5.0000 mL | ORAL_SOLUTION | Freq: Four times a day (QID) | ORAL | 0 refills | Status: DC | PRN

## 2021-12-25 NOTE — Assessment & Plan Note (Signed)
F/u w/Dr Kaur Cont on Seroquel, Xanax prn 

## 2021-12-25 NOTE — Assessment & Plan Note (Signed)
Use Aquaphore and Triamc 0.1 % prn

## 2021-12-25 NOTE — Patient Instructions (Signed)
Melody [name] , Thank you for taking time to come for your Medicare Wellness Visit. I appreciate your ongoing commitment to your health goals. Please review the following plan we discussed and let me know if I can assist you in the future.   Screening recommendations/referrals: Colonoscopy: never done Mammogram: 02/12/2017 Bone Density: never done Recommended yearly ophthalmology/optometry visit for glaucoma screening and checkup Recommended yearly dental visit for hygiene and checkup  Vaccinations:  Influenza vaccine: 09/05/2021 Pneumococcal vaccine: 08/01/2015, 05/16/2018 Tdap vaccine: 08/01/2015 Shingles vaccine: never done   Covid-19: 04/04/2020, 05/02/2020  Advanced directives: Advance directive discussed with you today. Even though you declined this today please call our office should you change your mind and we can give you the proper paperwork for you to fill out.  Conditions/risks identified: Yes; Client understands the importance of follow-up appointments with providers by attending scheduled visits and discussed goals to eat healthier, increase physical activity 5 times a week for 30 minutes each, exercise the brain by doing stimulating brain exercises (reading, adult coloring, crafting, listening to music, puzzles, etc.), socialize and enjoy life more, get enough sleep at least 8-9 hours average per night and make time for laughter.  Next appointment: Please schedule your next Medicare Wellness Visit with your Nurse Health Advisor in 1 year by calling [phone].   Preventive Care 21 Years and Older, Female Preventive care refers to lifestyle choices and visits with your health care provider that can promote health and wellness. What does preventive care include? A yearly physical exam. This is also called an annual well check. Dental exams once or twice a year. Routine eye exams. Ask your health care provider how often you should have your eyes checked. Personal lifestyle choices,  including: Daily care of your teeth and gums. Regular physical activity. Eating a healthy diet. Avoiding tobacco and drug use. Limiting alcohol use. Practicing safe sex. Taking low-dose aspirin every day. Taking vitamin and mineral supplements as recommended by your health care provider. What happens during an annual well check? The services and screenings done by your health care provider during your annual well check will depend on your age, overall health, lifestyle risk factors, and family history of disease. Counseling  Your health care provider may ask you questions about your: Alcohol use. Tobacco use. Drug use. Emotional well-being. Home and relationship well-being. Sexual activity. Eating habits. History of falls. Memory and ability to understand (cognition). Work and work Statistician. Reproductive health. Screening  You may have the following tests or measurements: Height, weight, and BMI. Blood pressure. Lipid and cholesterol levels. These may be checked every 5 years, or more frequently if you are over 75 years old. Skin check. Lung cancer screening. You may have this screening every year starting at age 36 if you have a 30-pack-year history of smoking and currently smoke or have quit within the past 15 years. Fecal occult blood test (FOBT) of the stool. You may have this test every year starting at age 63. Flexible sigmoidoscopy or colonoscopy. You may have a sigmoidoscopy every 5 years or a colonoscopy every 10 years starting at age 92. Hepatitis C blood test. Hepatitis B blood test. Sexually transmitted disease (STD) testing. Diabetes screening. This is done by checking your blood sugar (glucose) after you have not eaten for a while (fasting). You may have this done every 1-3 years. Bone density scan. This is done to screen for osteoporosis. You may have this done starting at age 6. Mammogram. This may be done every 1-2 years.  Talk to your health care provider  about how often you should have regular mammograms. Talk with your health care provider about your test results, treatment options, and if necessary, the need for more tests. Vaccines  Your health care provider may recommend certain vaccines, such as: Influenza vaccine. This is recommended every year. Tetanus, diphtheria, and acellular pertussis (Tdap, Td) vaccine. You may need a Td booster every 10 years. Zoster vaccine. You may need this after age 92. Pneumococcal 13-valent conjugate (PCV13) vaccine. One dose is recommended after age 64. Pneumococcal polysaccharide (PPSV23) vaccine. One dose is recommended after age 27. Talk to your health care provider about which screenings and vaccines you need and how often you need them. This information is not intended to replace advice given to you by your health care provider. Make sure you discuss any questions you have with your health care provider. Document Released: 11/11/2015 Document Revised: 07/04/2016 Document Reviewed: 08/16/2015 Elsevier Interactive Patient Education  2017 [employer] Prevention in the Home Falls can cause injuries. They can happen to people of all ages. There are many things you can do to make your home safe and to help prevent falls. What can I do on the outside of my home? Regularly fix the edges of walkways and driveways and fix any cracks. Remove anything that might make you trip as you walk through a door, such as a raised step or threshold. Trim any bushes or trees on the path to your home. Use bright outdoor lighting. Clear any walking paths of anything that might make someone trip, such as rocks or tools. Regularly check to see if handrails are loose or broken. Make sure that both sides of any steps have handrails. Any raised decks and porches should have guardrails on the edges. Have any leaves, snow, or ice cleared regularly. Use sand or salt on walking paths during winter. Clean up any spills in  your garage right away. This includes oil or grease spills. What can I do in the bathroom? Use night lights. Install grab bars by the toilet and in the tub and shower. Do not use towel bars as grab bars. Use non-skid mats or decals in the tub or shower. If you need to sit down in the shower, use a plastic, non-slip stool. Keep the floor dry. Clean up any water that spills on the floor as soon as it happens. Remove soap buildup in the tub or shower regularly. Attach bath mats securely with double-sided non-slip rug tape. Do not have throw rugs and other things on the floor that can make you trip. What can I do in the bedroom? Use night lights. Make sure that you have a Nin by your bed that is easy to reach. Do not use any sheets or blankets that are too big for your bed. They should not hang down onto the floor. Have a firm chair that has side arms. You can use this for support while you get dressed. Do not have throw rugs and other things on the floor that can make you trip. What can I do in the kitchen? Clean up any spills right away. Avoid walking on wet floors. Keep items that you use a lot in easy-to-reach places. If you need to reach something above you, use a strong step stool that has a grab bar. Keep electrical cords out of the way. Do not use floor polish or wax that makes floors slippery. If you must use wax, use non-skid floor wax.  Do not have throw rugs and other things on the floor that can make you trip. What can I do with my stairs? Do not leave any items on the stairs. Make sure that there are handrails on both sides of the stairs and use them. Fix handrails that are broken or loose. Make sure that handrails are as long as the stairways. Check any carpeting to make sure that it is firmly attached to the stairs. Fix any carpet that is loose or worn. Avoid having throw rugs at the top or bottom of the stairs. If you do have throw rugs, attach them to the floor with carpet  tape. Make sure that you have a Bulnes switch at the top of the stairs and the bottom of the stairs. If you do not have them, ask someone to add them for you. What else can I do to help prevent falls? Wear shoes that: Do not have high heels. Have rubber bottoms. Are comfortable and fit you well. Are closed at the toe. Do not wear sandals. If you use a stepladder: Make sure that it is fully opened. Do not climb a closed stepladder. Make sure that both sides of the stepladder are locked into place. Ask someone to hold it for you, if possible. Clearly mark and make sure that you can see: Any grab bars or handrails. First and last steps. Where the edge of each step is. Use tools that help you move around (mobility aids) if they are needed. These include: Canes. Walkers. Scooters. Crutches. Turn on the lights when you go into a dark area. Replace any Fiero bulbs as soon as they burn out. Set up your furniture so you have a clear path. Avoid moving your furniture around. If any of your floors are uneven, fix them. If there are any pets around you, be aware of where they are. Review your medicines with your doctor. Some medicines can make you feel dizzy. This can increase your chance of falling. Ask your doctor what other things that you can do to help prevent falls. This information is not intended to replace advice given to you by your health care provider. Make sure you discuss any questions you have with your health care provider. Document Released: 08/11/2009 Document Revised: 03/22/2016 Document Reviewed: 11/19/2014 Elsevier Interactive Patient Education  2017 [employer].

## 2021-12-25 NOTE — Assessment & Plan Note (Signed)
F/u w/dr Melvyn Novas

## 2021-12-25 NOTE — Progress Notes (Addendum)
Subjective:   Melody [name] is a 66 y.o. female who presents for Medicare Annual (Subsequent) preventive examination.  Review of Systems     Cardiac Risk Factors include: advanced age (>47men, >54 women);family history of premature cardiovascular disease     Objective:    Today's Vitals   12/25/21 1640  BP: 122/78  Pulse: 70  Resp: 16  Temp: 98.5 F (36.9 C)  SpO2: 94%  Weight: 141 lb (64 kg)  Height: 5\' 6"  (1.676 m)  PainSc: 0-No pain   Body mass index is 22.76 kg/m.  Advanced Directives 12/25/2021 10/06/2020 11/08/2018 11/07/2018 09/30/2018 09/30/2018 06/11/2018  Does Patient Have a Medical Advance Directive? No No No No No No No  Would patient like information on creating a medical advance directive? No - Patient declined No - Patient declined No - Patient declined No - Patient declined No - Patient declined No - Patient declined -    Current Medications (verified) Outpatient Encounter Medications as of 12/25/2021  Medication Sig   albuterol (VENTOLIN HFA) 108 (90 Base) MCG/ACT inhaler Inhale 2 puffs into the lungs every 4 (four) hours as needed for wheezing or shortness of breath.   ALPRAZolam (XANAX) 1 MG tablet Take 1 mg by mouth 4 (four) times daily.   Azelastine-Fluticasone (DYMISTA) 137-50 MCG/ACT SUSP Place 1 spray 2 (two) times daily into the nose.   Cholecalciferol (VITAMIN D3) 1.25 MG (50000 UT) CAPS TAKE ONE CAPSULE BY MOUTH EVERY 14 DAYS   clotrimazole (MYCELEX) 10 MG troche Take 1 tablet (10 mg total) by mouth 5 (five) times daily.   cyanocobalamin (,VITAMIN B-12,) 1000 MCG/ML injection INJECT 1ML INTO THE SKIN EVERY 14 DAYS   DULERA 100-5 MCG/ACT AERO inhale TWO puffs first thing in THE morning THEN another TWO puffs 12 hours LATER   FLUAD QUADRIVALENT 0.5 ML injection    fluticasone (FLONASE) 50 MCG/ACT nasal spray INSTILL TWO SPRAYS IN EACH NOSTRIL DAILY   gabapentin (NEURONTIN) 100 MG capsule Take 100 mg by mouth 4 (four) times daily. Take 100 mg by mouth 4  (four) times daily.   levocetirizine (XYZAL) 5 MG tablet TAKE 1 TABLET BY MOUTH EVERY EVENING (FOR ALLERGY)   levofloxacin (LEVAQUIN) 500 MG tablet Take 500 mg by mouth daily. Take 1 by mouth daily   levothyroxine (SYNTHROID) 50 MCG tablet Take 1 tablet (50 mcg total) by mouth daily before breakfast.   methylphenidate (RITALIN) 20 MG tablet Take 1 tablet (20 mg total) by mouth daily.   oxyCODONE-acetaminophen (PERCOCET/ROXICET) 5-325 MG tablet TAKE 1 TABLET BY MOUTH EVERY 8 HOURS AS NEEDED FOR SEVERE PAIN   pantoprazole (PROTONIX) 40 MG tablet TAKE 1 TABLET BY MOUTH DAILY (REFLUX)   QUEtiapine (SEROQUEL) 25 MG tablet Take 25 mg by mouth at bedtime.   Respiratory Therapy Supplies (FLUTTER) DEVI Use as directed   rizatriptan (MAXALT) 10 MG tablet Take 1 tablet (10 mg total) by mouth once as needed for up to 1 dose for migraine. May repeat in 2 hours if needed   triamcinolone ointment (KENALOG) 0.1 % APPLY TO THE AFFECTED AREA(S) TWICE DAILY   No facility-administered encounter medications on file as of 12/25/2021.    Allergies (verified) Flagyl [metronidazole], Doxycycline hyclate, Gabapentin, Nabumetone, Sulfa antibiotics, Tetracycline hcl, and Tramadol hcl   History: Past Medical History:  Diagnosis Date   Anxiety    Bronchiectasis (HCC)    Chronic back pain    Chronic neck pain    Depression    bipolar- Dr [name]  Fatigue    Fibromyalgia    GERD (gastroesophageal reflux disease)    Headache    Hypothyroidism    Low back pain    Dr Arnoldo Morale   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[facility])    Osteoarthritis    Pneumonia    Pulmonary nodule    Sinus congestion    Thyroid disease    hypothyroidsm   Vertigo    Vitamin B12 deficiency    Past Surgical History:  Procedure Laterality Date   ABDOMINAL HYSTERECTOMY N/A 01/10/2016   Procedure: HYSTERECTOMY ABDOMINAL;  Surgeon: Nunzio Cobbs, MD;  Location: [facility];  Service: Gynecology;  Laterality: N/A;   ABDOMINAL  SACROCOLPOPEXY N/A 01/10/2016   Procedure: ABDOMINO SACROCOLPOPEXY ;  Surgeon: Nunzio Cobbs, MD;  Location: [facility];  Service: Gynecology;  Laterality: N/A;   ANTERIOR AND POSTERIOR REPAIR N/A 01/10/2016   Procedure:  POSTERIOR REPAIR (RECTOCELE);  Surgeon: Nunzio Cobbs, MD;  Location: [facility];  Service: Gynecology;  Laterality: N/A;   BLADDER SUSPENSION N/A 01/10/2016   Procedure: TRANSVAGINAL TAPE (TVT) PROCEDURE exact midurethral sling;  Surgeon: Nunzio Cobbs, MD;  Location: [facility];  Service: Gynecology;  Laterality: N/A;   CERVICAL LAMINECTOMY  2001 and 2995   Dr Harmon Pier N/A 01/10/2016   Procedure: CYSTOSCOPY;  Surgeon: Nunzio Cobbs, MD;  Location: [facility];  Service: Gynecology;  Laterality: N/A;   SALPINGOOPHORECTOMY Bilateral 01/10/2016   Procedure: BILATERAL SALPINGO OOPHORECTOMY;  Surgeon: Nunzio Cobbs, MD;  Location: [facility];  Service: Gynecology;  Laterality: Bilateral;   TUBAL LIGATION  1980   Family History  Problem Relation Age of Onset   Heart attack Father        dec heart disease age 67   Hypertension Father    Lung disease Father    Cancer Mother        dec stomach ca--age 62   Breast cancer Cousin    Arthritis Other    Breast cancer Paternal Aunt    Social History   Socioeconomic History   Marital status: Legally Separated    Spouse name: Not on file   Number of children: Not on file   Years of education: Not on file   Highest education level: Not on file  Occupational History   Not on file  Tobacco Use   Smoking status: Former    Packs/day: 1.00    Years: 35.00    Pack years: 35.00    Types: Cigarettes    Start date: 11/21/1962    Quit date: 05/06/2011    Years since quitting: 10.6   Smokeless tobacco: Never   Tobacco comments:    stopped periodically - peak rate of 1 ppd  Vaping Use   Vaping Use: Never used  Substance and Sexual Activity   Alcohol use: No    Alcohol/week: 0.0 standard  drinks   Drug use: No   Sexual activity: Not Currently    Birth control/protection: Post-menopausal, Surgical    Comment: Tubal/Hyst/BSO  Other Topics Concern   Not on file  Social History Narrative   [facility] (04/04/17):   Originally from [place], [place]. 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 Recently had a persian cat that passed. No bird exposure. She denies any indoor plants currently. She does use wood burning for heat. She reports there is water getting under  her foundation. She has mold in her home that she reports is in the 2nd floor of her home. She has recently started seeing mold under her bed. She reports her home smells musty & damp. She has always lived in [place].    Social Determinants of Health   Financial Resource Strain: Not on file  Food Insecurity: Not on file  Transportation Needs: No Transportation Needs   Lack of Transportation (Medical): No   Lack of Transportation (Non-Medical): No  Physical Activity: Inactive   Days of Exercise per Week: 0 days   Minutes of Exercise per Session: 0 min  Stress: Not on file  Social Connections: Socially Isolated   Frequency of Communication with Friends and Family: More than three times a week   Frequency of Social Gatherings with Friends and Family: Never   Attends Religious Services: Never   Marine scientist or Organizations: No   Attends Music therapist: Never   Marital Status: Separated    Tobacco Counseling Counseling given: Not Answered Tobacco comments: stopped periodically - peak rate of 1 ppd   Clinical Intake:  Pre-visit preparation completed: Yes  Pain : No/denies pain Pain Score: 0-No pain     BMI - recorded: 22.76 Nutritional Status: BMI of 19-24  Normal Nutritional Risks: None Diabetes: No  How often do you need to have someone help you when you read instructions, pamphlets, or other written materials from your doctor or  pharmacy?: 1 - Never What is the last grade level you completed in school?: HSG  Diabetic? no  Interpreter Needed?: No  Information entered by :: Lisette Abu, LPN   Activities of Daily Living In your present state of health, do you have any difficulty performing the following activities: 12/25/2021  Hearing? N  Vision? N  Difficulty concentrating or making decisions? N  Walking or climbing stairs? N  Dressing or bathing? N  Doing errands, shopping? N  Preparing Food and eating ? N  Using the Toilet? N  In the past six months, have you accidently leaked urine? N  Do you have problems with loss of bowel control? N  Managing your Medications? N  Managing your Finances? N  Housekeeping or managing your Housekeeping? N  Some recent data might be hidden    Patient Care Team: Plotnikov, Evie Lacks, MD as PCP - General Nunzio Cobbs, MD as Consulting Physician (Obstetrics and Gynecology) Chucky May, MD as Consulting Physician (Psychiatry) Standley Brooking, LCSW as [place] [facility] (Licensed Clinical Social Worker) Melvyn Novas, Christena Deem, MD as Consulting Physician (Pulmonary Disease)  Indicate any recent Medical Services you may have received from other than [facility] providers in the past year (date may be approximate).     Assessment:   This is a routine wellness examination for [place].  Hearing/Vision screen Hearing Screening - Comments:: Patient denied any hearing difficulty.   No hearing aids.  Vision Screening - Comments:: Patient does not wear any corrective lenses/contacts.    Dietary issues and exercise activities discussed: Current Exercise Habits: The patient does not participate in regular exercise at present, Exercise limited by: psychological condition(s);respiratory conditions(s)   Goals Addressed             This Visit's Progress    Patient Stated       Patient declined healthcare goal at this time.      Depression  Screen PHQ 2/9 Scores 12/25/2021 10/06/2020 11/12/2018 11/12/2018 12/25/2017 10/16/2017 05/13/2015  PHQ -  2 Score 6 6 2 2  0 1 1  PHQ- 9 Score 12 12 9  - - - -    Fall Risk Fall Risk  12/25/2021 10/06/2020 07/06/2020 11/12/2018 12/25/2017  Falls in the past year? 0 0 1 0 Yes  Number falls in past yr: 0 0 0 - 1  Injury with Fall? 0 0 0 - No  Risk for fall due to : No Fall Risks No Fall Risks - - -  Follow up Falls evaluation completed Falls evaluation completed - - -    FALL RISK PREVENTION PERTAINING TO THE HOME:  Any stairs in or around the home? Yes  If so, are there any without handrails? No  Home free of loose throw rugs in walkways, pet beds, electrical cords, etc? Yes  Adequate lighting in your home to reduce risk of falls? Yes   ASSISTIVE DEVICES UTILIZED TO PREVENT FALLS:  Life alert? No  Use of a cane, walker or w/c? No  Grab bars in the bathroom? No  Shower chair or bench in shower? No  Elevated toilet seat or a handicapped toilet? No   TIMED UP AND GO:  Was the test performed? Yes .  Length of time to ambulate 10 feet: 6 sec.   Gait steady and fast without use of assistive device  Cognitive Function: Normal cognitive status assessed by direct observation by this Nurse Health Advisor. No abnormalities found.          Immunizations Immunization History  Administered Date(s) Administered   Fluad Quad(high Dose 65+) 08/31/2020   Influenza Split 08/05/2012   Influenza Whole 09/01/2008, 09/02/2009   Influenza, High Dose Seasonal PF 09/05/2021   Influenza,inj,Quad PF,6+ Mos 08/10/2016, 08/23/2017, 08/06/2018, 06/24/2019   Influenza,inj,quad, With Preservative 08/29/2020   Influenza-Unspecified 08/29/2013, 07/30/2014, 08/01/2015   Moderna Sars-Covid-2 Vaccination 04/04/2020, 05/02/2020   Pneumococcal Conjugate-13 08/01/2015   Pneumococcal Polysaccharide-23 05/16/2018   Tdap 08/01/2015   Zoster, Live 08/01/2015    TDAP status: Up to date  Flu Vaccine status: Up to  date  Pneumococcal vaccine status: Up to date  Covid-19 vaccine status: Completed vaccines  Qualifies for Shingles Vaccine? Yes   Zostavax completed Yes   Shingrix Completed?: No.    Education has been provided regarding the importance of this vaccine. Patient has been advised to call insurance company to determine out of pocket expense if they have not yet received this vaccine. Advised may also receive vaccine at local pharmacy or Health Dept. Verbalized acceptance and understanding.  Screening Tests Health Maintenance  Topic Date Due   Zoster Vaccines- Shingrix (1 of 2) Never done   COLONOSCOPY (Pts 45-62yrs Insurance coverage will need to be confirmed)  Never done   MAMMOGRAM  01/31/2019   DEXA SCAN  Never done   COVID-19 Vaccine (3 - Moderna risk series) 05/30/2020   Pneumonia Vaccine 27+ Years old (3) 05/17/2023   TETANUS/TDAP  07/31/2025   INFLUENZA VACCINE  Completed   Hepatitis C Screening  Completed   HPV VACCINES  Aged Out    Health Maintenance  Health Maintenance Due  Topic Date Due   Zoster Vaccines- Shingrix (1 of 2) Never done   COLONOSCOPY (Pts 45-76yrs Insurance coverage will need to be confirmed)  Never done   MAMMOGRAM  01/31/2019   DEXA SCAN  Never done   COVID-19 Vaccine (3 - Moderna risk series) 05/30/2020    Colorectal screening: never done  Mammogram status: Completed 02/12/2017. Repeat every year  Bone density: never done  Lung  Cancer Screening: (Low Dose CT Chest recommended if Age 31-80 years, 30 pack-year currently smoking OR have quit w/in 15years.) does not qualify.   Lung Cancer Screening Referral: no  Additional Screening:  Hepatitis C Screening: does qualify; Completed yes  Vision Screening: Recommended annual ophthalmology exams for early detection of glaucoma and other disorders of the eye. Is the patient up to date with their annual eye exam?  Yes  Who is the provider or what is the name of the office in which the patient attends  annual eye exams? [facility] If pt is not established with a provider, would they like to be referred to a provider to establish care? No .   Dental Screening: Recommended annual dental exams for proper oral hygiene  Community Resource Referral / Chronic Care Management: CRR required this visit?  Yes   CCM required this visit?  Yes      Plan:     I have personally reviewed and noted the following in the patients chart:   Medical and social history Use of alcohol, tobacco or illicit drugs  Current medications and supplements including opioid prescriptions.  Functional ability and status Nutritional status Physical activity Advanced directives List of other physicians Hospitalizations, surgeries, and ER visits in previous 12 months Vitals Screenings to include cognitive, depression, and falls Referrals and appointments  In addition, I have reviewed and discussed with patient certain preventive protocols, quality metrics, and best practice recommendations. A written personalized care plan for preventive services as well as general preventive health recommendations were provided to patient.     Sheral Flow, LPN   [id number]/82/[id number]   Nurse Notes:  Referral for Social Worker Referral for Housing Instability/Financial Need/Food Insecurity  Medical screening examination/treatment/procedure(s) were performed by non-physician practitioner and as supervising physician I was immediately available for consultation/collaboration.  I agree with above. Lew Dawes, MD

## 2021-12-25 NOTE — Progress Notes (Signed)
Subjective:  Patient ID: Melody [name], female    DOB: 11/18/1955  Age: 66 y.o. MRN: [id number]  CC: No chief complaint on file.   HPI RONIT CRANFIELD presents for sinusitis sx's, SOB, cough, anxiety, LBP   Outpatient Medications Prior to Visit  Medication Sig Dispense Refill   albuterol (VENTOLIN HFA) 108 (90 Base) MCG/ACT inhaler Inhale 2 puffs into the lungs every 4 (four) hours as needed for wheezing or shortness of breath. 8.5 g 5   ALPRAZolam (XANAX) 1 MG tablet Take 1 mg by mouth 4 (four) times daily.     Azelastine-Fluticasone (DYMISTA) 137-50 MCG/ACT SUSP Place 1 spray 2 (two) times daily into the nose. 1 Bottle 3   Cholecalciferol (VITAMIN D3) 1.25 MG (50000 UT) CAPS TAKE ONE CAPSULE BY MOUTH EVERY 14 DAYS 6 capsule 3   clotrimazole (MYCELEX) 10 MG troche Take 1 tablet (10 mg total) by mouth 5 (five) times daily. 35 Troche 11   cyanocobalamin (,VITAMIN B-12,) 1000 MCG/ML injection INJECT 1ML INTO THE SKIN EVERY 14 DAYS 6 mL 3   DULERA 100-5 MCG/ACT AERO inhale TWO puffs first thing in THE morning THEN another TWO puffs 12 hours LATER 13 g 11   FLUAD QUADRIVALENT 0.5 ML injection      fluticasone (FLONASE) 50 MCG/ACT nasal spray INSTILL TWO SPRAYS IN EACH NOSTRIL DAILY 48 g 3   gabapentin (NEURONTIN) 100 MG capsule Take 100 mg by mouth 4 (four) times daily. Take 100 mg by mouth 4 (four) times daily.     levocetirizine (XYZAL) 5 MG tablet TAKE 1 TABLET BY MOUTH EVERY EVENING (FOR ALLERGY) 90 tablet 3   levofloxacin (LEVAQUIN) 500 MG tablet Take 500 mg by mouth daily. Take 1 by mouth daily     levothyroxine (SYNTHROID) 50 MCG tablet Take 1 tablet (50 mcg total) by mouth daily before breakfast. 90 tablet 3   methylphenidate (RITALIN) 20 MG tablet Take 1 tablet (20 mg total) by mouth daily.  0   oxyCODONE-acetaminophen (PERCOCET/ROXICET) 5-325 MG tablet TAKE 1 TABLET BY MOUTH EVERY 8 HOURS AS NEEDED FOR SEVERE PAIN 90 tablet 0   pantoprazole (PROTONIX) 40 MG tablet TAKE 1 TABLET BY  MOUTH DAILY (REFLUX) 90 tablet 3   QUEtiapine (SEROQUEL) 25 MG tablet Take 25 mg by mouth at bedtime.     Respiratory Therapy Supplies (FLUTTER) DEVI Use as directed 1 each 0   rizatriptan (MAXALT) 10 MG tablet Take 1 tablet (10 mg total) by mouth once as needed for up to 1 dose for migraine. May repeat in 2 hours if needed 12 tablet 5   triamcinolone ointment (KENALOG) 0.1 % APPLY TO THE AFFECTED AREA(S) TWICE DAILY 80 g 3   promethazine-dextromethorphan (PROMETHAZINE-DM) 6.25-15 MG/5ML syrup Take 5 mLs by mouth 4 (four) times daily as needed for cough. 240 mL 0   No facility-administered medications prior to visit.    ROS: Review of Systems  Constitutional:  Negative for activity change, appetite change, chills, fatigue and unexpected weight change.  HENT:  Positive for congestion. Negative for mouth sores and sinus pressure.   Eyes:  Negative for visual disturbance.  Respiratory:  Positive for cough. Negative for chest tightness.   Gastrointestinal:  Negative for abdominal pain and nausea.  Genitourinary:  Negative for difficulty urinating, frequency and vaginal pain.  Musculoskeletal:  Negative for back pain and gait problem.  Skin:  Negative for pallor and rash.  Neurological:  Negative for dizziness, tremors, weakness, numbness and headaches.  Psychiatric/Behavioral:  Negative for confusion, sleep disturbance and suicidal ideas. The patient is nervous/anxious.    Objective:  BP 122/78 (BP Location: Left Arm, Patient Position: Sitting, Cuff Size: Large)    Pulse 70    Temp 98.5 F (36.9 C) (Oral)    Ht 5\' 6"  (1.676 m)    Wt 141 lb (64 kg)    LMP 10/29/2006 (Approximate)    SpO2 94%    BMI 22.76 kg/m   BP Readings from Last 3 Encounters:  12/25/21 122/78  09/11/21 (!) 148/82  06/08/21 (!) 148/82    Wt Readings from Last 3 Encounters:  12/25/21 141 lb (64 kg)  09/11/21 137 lb 6.4 oz (62.3 kg)  06/08/21 132 lb (59.9 kg)    Physical Exam Constitutional:      General: She is  not in acute distress.    Appearance: Normal appearance. She is well-developed.  HENT:     Head: Normocephalic.     Right Ear: External ear normal.     Left Ear: External ear normal.     Nose: Nose normal.  Eyes:     General:        Right eye: No discharge.        Left eye: No discharge.     Conjunctiva/sclera: Conjunctivae normal.     Pupils: Pupils are equal, round, and reactive to [place].  Neck:     Thyroid: No thyromegaly.     Vascular: No JVD.     Trachea: No tracheal deviation.  Cardiovascular:     Rate and Rhythm: Normal rate and regular rhythm.     Heart sounds: Normal heart sounds.  Pulmonary:     Effort: No respiratory distress.     Breath sounds: No stridor. No wheezing.  Abdominal:     General: Bowel sounds are normal. There is no distension.     Palpations: Abdomen is soft. There is no mass.     Tenderness: There is no abdominal tenderness. There is no guarding or rebound.  Musculoskeletal:        General: No tenderness.     Cervical back: Normal range of motion and neck supple. No rigidity.  Lymphadenopathy:     Cervical: No cervical adenopathy.  Skin:    Findings: No erythema or rash.  Neurological:     Mental Status: She is oriented to person, place, and time.     Cranial Nerves: No cranial nerve deficit.     Motor: No abnormal muscle tone.     Coordination: Coordination normal.     Deep Tendon Reflexes: Reflexes normal.  Psychiatric:        Behavior: Behavior normal.        Thought Content: Thought content normal.        Judgment: Judgment normal.  LE w/dry skin  Lab Results  Component Value Date   WBC 6.3 07/06/2020   HGB 13.5 07/06/2020   HCT 40.7 07/06/2020   PLT 168 07/06/2020   GLUCOSE 76 07/06/2020   CHOL 180 05/16/2018   TRIG 67.0 05/16/2018   HDL 69.80 05/16/2018   LDLCALC 96 05/16/2018   ALT 7 07/06/2020   AST 12 07/06/2020   NA 142 07/06/2020   K 3.7 07/06/2020   CL 103 07/06/2020   CREATININE 0.91 07/06/2020   BUN 9 07/06/2020    CO2 30 07/06/2020   TSH 3.21 07/06/2020    No results found.  Assessment & Plan:   Problem List Items Addressed This Visit  Anxiety disorder    F/u w/Dr [name] Cont on Seroquel, Xanax prn      COPD GOLD I     F/u w/dr Melvyn Novas      Relevant Medications   promethazine-dextromethorphan (PROMETHAZINE-DM) 6.25-15 MG/5ML syrup   Cough    Cough syr      Low back pain    Percocet prn   Potential benefits of a long term opioids use as well as potential risks (i.e. addiction risk, apnea etc) and complications (i.e. Somnolence, constipation and others) were explained to the patient and were aknowledged.  Potential benefits of a long term NSAID  use as well as potential risks  and complications were explained to the patient and were aknowledged.       Rash    Use Aquaphore and Triamc 0.1 % prn         Meds ordered this encounter  Medications   promethazine-dextromethorphan (PROMETHAZINE-DM) 6.25-15 MG/5ML syrup    Sig: Take 5 mLs by mouth 4 (four) times daily as needed for cough.    Dispense:  240 mL    Refill:  0      Follow-up: Return in about 2 months (around 02/22/2022) for a follow-up visit.  Walker Kehr, MD

## 2021-12-25 NOTE — Assessment & Plan Note (Signed)
Cough syr

## 2021-12-25 NOTE — Assessment & Plan Note (Signed)
Percocet prn  Potential benefits of a long term opioids use as well as potential risks (i.e. addiction risk, apnea etc) and complications (i.e. Somnolence, constipation and others) were explained to the patient and were aknowledged.  Potential benefits of a long term NSAID  use as well as potential risks  and complications were explained to the patient and were aknowledged. 

## 2022-02-21 ENCOUNTER — Other Ambulatory Visit: Payer: Self-pay | Admitting: Internal Medicine

## 2022-02-21 DIAGNOSIS — J449 Chronic obstructive pulmonary disease, unspecified: Secondary | ICD-10-CM

## 2022-02-21 NOTE — Telephone Encounter (Signed)
The original prescription was discontinued on 06/08/2021 by Earnstine Regal, RMA. Is this ok to refill.Marland KitchenJohny [name] ?

## 2022-02-22 ENCOUNTER — Ambulatory Visit (INDEPENDENT_AMBULATORY_CARE_PROVIDER_SITE_OTHER): Payer: Medicare Other | Admitting: Internal Medicine

## 2022-02-22 ENCOUNTER — Encounter: Payer: Self-pay | Admitting: Internal Medicine

## 2022-02-22 ENCOUNTER — Other Ambulatory Visit: Payer: Self-pay | Admitting: Internal Medicine

## 2022-02-22 DIAGNOSIS — M1811 Unilateral primary osteoarthritis of first carpometacarpal joint, right hand: Secondary | ICD-10-CM | POA: Diagnosis not present

## 2022-02-22 DIAGNOSIS — F419 Anxiety disorder, unspecified: Secondary | ICD-10-CM

## 2022-02-22 DIAGNOSIS — E034 Atrophy of thyroid (acquired): Secondary | ICD-10-CM | POA: Diagnosis not present

## 2022-02-22 DIAGNOSIS — M79644 Pain in right finger(s): Secondary | ICD-10-CM

## 2022-02-22 MED ORDER — PANTOPRAZOLE SODIUM 40 MG PO TBEC: 40.0000 mg | DELAYED_RELEASE_TABLET | Freq: Two times a day (BID) | ORAL | 3 refills | Status: DC

## 2022-02-22 MED ORDER — TRIAMCINOLONE ACETONIDE 0.1 % EX OINT: TOPICAL_OINTMENT | Freq: Two times a day (BID) | CUTANEOUS | 3 refills | Status: DC | PRN

## 2022-02-22 MED ORDER — LEVOTHYROXINE SODIUM 50 MCG PO TABS: 50.0000 ug | ORAL_TABLET | Freq: Every day | ORAL | 3 refills | Status: DC

## 2022-02-22 MED ORDER — DICLOFENAC SODIUM 1 % EX GEL: CUTANEOUS | 3 refills | Status: AC

## 2022-02-22 NOTE — Assessment & Plan Note (Signed)
stress w/son - she moved out of the house, slept at the motel. Seeing a legal representative at "[employer]" today. ?8/22 Ataya continues to have a lot of stress with her situation at home, her husband, her house and her son.  She spends a fair amount of time at her cousin's house in [place].  This is a relief. ?

## 2022-02-22 NOTE — Assessment & Plan Note (Signed)
Worse ?Pt asked for an injection ?Procedure: ?1st  MCP joint was marked and  the skin was prepped with Betadine and alcohol. 1 inch 25-gauge needle was used. The needle was advanced  into the joint. It  was injected with 0.5 mL of 2% lidocaine and 10 mg of Depo-Medrol in a usual fashion.  Band-Aid applied. ACE wrap 2 " applied ?

## 2022-02-22 NOTE — Assessment & Plan Note (Signed)
See Procedure 

## 2022-02-22 NOTE — Progress Notes (Signed)
? ?Subjective:  ?Patient ID: Melody [name], female    DOB: 12/29/1955  Age: 66 y.o. MRN: [id number] ? ?CC: Follow-up (2 month follow up ) ? ? ?HPI ?Melody [name] presents for depression, HAs, anxiety, LBP ?The patient is complaining of severe thumb pain.  No injury ? ?Outpatient Medications Prior to Visit  ?Medication Sig Dispense Refill  ? albuterol (VENTOLIN HFA) 108 (90 Base) MCG/ACT inhaler Inhale 2 puffs into the lungs every 4 (four) hours as needed for wheezing or shortness of breath. 8.5 g 5  ? ALPRAZolam (XANAX) 1 MG tablet Take 1 mg by mouth 4 (four) times daily.    ? Azelastine-Fluticasone (DYMISTA) 137-50 MCG/ACT SUSP Place 1 spray 2 (two) times daily into the nose. 1 Bottle 3  ? Cholecalciferol (VITAMIN D3) 1.25 MG (50000 UT) CAPS TAKE ONE CAPSULE BY MOUTH EVERY 14 DAYS 6 capsule 3  ? cyanocobalamin (,VITAMIN B-12,) 1000 MCG/ML injection INJECT 1ML INTO THE SKIN EVERY 14 DAYS 6 mL 3  ? FLUAD QUADRIVALENT 0.5 ML injection     ? fluticasone (FLONASE) 50 MCG/ACT nasal spray INSTILL TWO SPRAYS IN EACH NOSTRIL DAILY 48 g 3  ? levocetirizine (XYZAL) 5 MG tablet TAKE 1 TABLET BY MOUTH EVERY EVENING (FOR ALLERGY) 90 tablet 3  ? levofloxacin (LEVAQUIN) 500 MG tablet TAKE 1 TABLET BY MOUTH DAILY 7 tablet 0  ? methylphenidate (RITALIN) 20 MG tablet Take 1 tablet (20 mg total) by mouth daily.  0  ? mometasone-formoterol (DULERA) 100-5 MCG/ACT AERO inhale TWO puffs first thing in THE morning THEN another TWO puffs 12 hours LATER 13 g 0  ? oxyCODONE-acetaminophen (PERCOCET/ROXICET) 5-325 MG tablet TAKE 1 TABLET BY MOUTH EVERY 8 HOURS AS NEEDED FOR SEVERE PAIN 90 tablet 0  ? promethazine-dextromethorphan (PROMETHAZINE-DM) 6.25-15 MG/5ML syrup Take 5 mLs by mouth 4 (four) times daily as needed for cough. 240 mL 0  ? QUEtiapine (SEROQUEL) 25 MG tablet Take 25 mg by mouth at bedtime.    ? Respiratory Therapy Supplies (FLUTTER) DEVI Use as directed 1 each 0  ? rizatriptan (MAXALT) 10 MG tablet Take 1 tablet (10 mg total)  by mouth once as needed for up to 1 dose for migraine. May repeat in 2 hours if needed 12 tablet 5  ? diclofenac Sodium (VOLTAREN) 1 % GEL APPLY TWO GRAMS TO AFFECTED AREA(S) FOUR TIMES DAILY 100 g 3  ? gabapentin (NEURONTIN) 100 MG capsule Take 100 mg by mouth 4 (four) times daily. Take 100 mg by mouth 4 (four) times daily.    ? levothyroxine (SYNTHROID) 50 MCG tablet Take 1 tablet (50 mcg total) by mouth daily before breakfast. 90 tablet 3  ? pantoprazole (PROTONIX) 40 MG tablet TAKE 1 TABLET BY MOUTH DAILY (REFLUX) 90 tablet 3  ? triamcinolone ointment (KENALOG) 0.1 % APPLY TO THE AFFECTED AREA(S) TWICE DAILY 80 g 3  ? clotrimazole (MYCELEX) 10 MG troche Take 1 tablet (10 mg total) by mouth 5 (five) times daily. (Patient not taking: Reported on 02/22/2022) 35 Troche 11  ? ?No facility-administered medications prior to visit.  ? ? ?ROS: ?Review of Systems  ?Constitutional:  Positive for fatigue. Negative for activity change, appetite change, chills and unexpected weight change.  ?HENT:  Negative for congestion, mouth sores and sinus pressure.   ?Eyes:  Negative for visual disturbance.  ?Respiratory:  Negative for cough and chest tightness.   ?Gastrointestinal:  Negative for abdominal pain and nausea.  ?Genitourinary:  Negative for difficulty urinating, frequency and vaginal pain.  ?  Musculoskeletal:  Positive for arthralgias and back pain. Negative for gait problem.  ?Skin:  Negative for pallor and rash.  ?Neurological:  Negative for dizziness, tremors, weakness, numbness and headaches.  ?Psychiatric/Behavioral:  Positive for decreased concentration. Negative for confusion, sleep disturbance and suicidal ideas. The patient is nervous/anxious.   ? ?Objective:  ?BP 136/74 (BP Location: Left Arm, Patient Position: Sitting, Cuff Size: Normal)   Pulse 74   Temp 98 ?F (36.7 ?C) (Oral)   Ht '5\' 6"'$  (5.009 m)   Wt 143 lb 4.8 oz (65 kg)   LMP 10/29/2006 (Approximate)   SpO2 98%   BMI 23.13 kg/m?  ? ?BP Readings from  Last 3 Encounters:  ?02/22/22 136/74  ?12/25/21 122/78  ?12/25/21 122/78  ? ? ?Wt Readings from Last 3 Encounters:  ?02/22/22 143 lb 4.8 oz (65 kg)  ?12/25/21 141 lb (64 kg)  ?12/25/21 141 lb (64 kg)  ? ? ?Physical Exam ?Constitutional:   ?   General: She is not in acute distress. ?   Appearance: Normal appearance. She is well-developed.  ?HENT:  ?   Head: Normocephalic.  ?   Right Ear: External ear normal.  ?   Left Ear: External ear normal.  ?   Nose: Nose normal.  ?Eyes:  ?   General:     ?   Right eye: No discharge.     ?   Left eye: No discharge.  ?   Conjunctiva/sclera: Conjunctivae normal.  ?   Pupils: Pupils are equal, round, and reactive to Apostol.  ?Neck:  ?   Thyroid: No thyromegaly.  ?   Vascular: No JVD.  ?   Trachea: No tracheal deviation.  ?Cardiovascular:  ?   Rate and Rhythm: Normal rate and regular rhythm.  ?   Heart sounds: Normal heart sounds.  ?Pulmonary:  ?   Effort: No respiratory distress.  ?   Breath sounds: No stridor. No wheezing.  ?Abdominal:  ?   General: Bowel sounds are normal. There is no distension.  ?   Palpations: Abdomen is soft. There is no mass.  ?   Tenderness: There is no abdominal tenderness. There is no guarding or rebound.  ?Musculoskeletal:     ?   General: Tenderness present.  ?   Cervical back: Normal range of motion and neck supple. No rigidity.  ?Lymphadenopathy:  ?   Cervical: No cervical adenopathy.  ?Skin: ?   Findings: No erythema or rash.  ?Neurological:  ?   Mental Status: She is oriented to person, place, and time.  ?   Cranial Nerves: No cranial nerve deficit.  ?   Motor: No abnormal muscle tone.  ?   Coordination: Coordination normal.  ?   Deep Tendon Reflexes: Reflexes normal.  ?Psychiatric:     ?   Behavior: Behavior normal.     ?   Thought Content: Thought content normal.     ?   Judgment: Judgment normal.  ?R thumb MCP w/pain ? ?Procedure: ? ?1st  MCP joint was marked and  the skin was prepped with Betadine and alcohol. 1 inch 25-gauge needle was used. The  needle was advanced  into the joint. It  was injected with 0.5 mL of 2% lidocaine and 10 mg of Depo-Medrol in a usual fashion.  Band-Aid applied. ACE wrap 2 " applied ? ? ?Lab Results  ?Component Value Date  ? WBC 6.3 07/06/2020  ? HGB 13.5 07/06/2020  ? HCT 40.7 07/06/2020  ? PLT 168  07/06/2020  ? GLUCOSE 76 07/06/2020  ? CHOL 180 05/16/2018  ? TRIG 67.0 05/16/2018  ? HDL 69.80 05/16/2018  ? Tripp 96 05/16/2018  ? ALT 7 07/06/2020  ? AST 12 07/06/2020  ? NA 142 07/06/2020  ? K 3.7 07/06/2020  ? CL 103 07/06/2020  ? CREATININE 0.91 07/06/2020  ? BUN 9 07/06/2020  ? CO2 30 07/06/2020  ? TSH 3.21 07/06/2020  ? ? ?No results found. ? ?Assessment & Plan:  ? ?Problem List Items Addressed This Visit   ? ? Anxiety disorder  ?  stress w/son - she moved out of the house, slept at the motel. Seeing a legal representative at "[employer]" today. ?8/22 Camyla continues to have a lot of stress with her situation at home, her husband, her house and her son.  She spends a fair amount of time at her cousin's house in [place].  This is a relief. ?  ?  ? Thumb pain, right  ?  Worse ?Pt asked for an injection ?Procedure: ?1st  MCP joint was marked and  the skin was prepped with Betadine and alcohol. 1 inch 25-gauge needle was used. The needle was advanced  into the joint. It  was injected with 0.5 mL of 2% lidocaine and 10 mg of Depo-Medrol in a usual fashion.  Band-Aid applied. ACE wrap 2 " applied ?  ?  ? Osteoarthritis of right thumb  ?  See Procedure ? ?  ?  ? Hypothyroidism (Chronic)  ? Relevant Medications  ? levothyroxine (SYNTHROID) 50 MCG tablet  ?  ? ? ?Meds ordered this encounter  ?Medications  ? triamcinolone ointment (KENALOG) 0.1 %  ?  Sig: Apply topically 2 (two) times daily as needed. to affected area(s)  ?  Dispense:  80 g  ?  Refill:  3  ?  This prescription was filled on 11/03/2021. Any refills authorized will be placed on file.  ? diclofenac Sodium (VOLTAREN) 1 % GEL  ?  Sig: APPLY TWO GRAMS TO AFFECTED  AREA(S) FOUR TIMES DAILY  ?  Dispense:  200 g  ?  Refill:  3  ? levothyroxine (SYNTHROID) 50 MCG tablet  ?  Sig: Take 1 tablet (50 mcg total) by mouth daily before breakfast.  ?  Dispense:  90 tablet  ?  Refill:

## 2022-03-06 ENCOUNTER — Other Ambulatory Visit: Payer: Self-pay | Admitting: Internal Medicine

## 2022-03-06 ENCOUNTER — Telehealth: Payer: Self-pay

## 2022-03-06 DIAGNOSIS — J449 Chronic obstructive pulmonary disease, unspecified: Secondary | ICD-10-CM

## 2022-03-06 NOTE — Telephone Encounter (Signed)
Called patient to let her know she needs a 12 month f/u with Dr. Melvyn Novas after receiving refill request for [facility] inhaler.  ?Offered dates to patient to have scheduled and line was disconnected.  ?Refill refused since patient has not been seen in over a year and was unable to make an appt.  ?

## 2022-03-16 ENCOUNTER — Telehealth: Payer: Self-pay

## 2022-03-19 NOTE — Telephone Encounter (Signed)
[name], Please do.  It is 10 mg.  Thanks

## 2022-03-26 ENCOUNTER — Other Ambulatory Visit: Payer: Self-pay | Admitting: Internal Medicine

## 2022-03-26 DIAGNOSIS — J449 Chronic obstructive pulmonary disease, unspecified: Secondary | ICD-10-CM

## 2022-04-26 DIAGNOSIS — R350 Frequency of micturition: Secondary | ICD-10-CM | POA: Diagnosis not present

## 2022-04-26 DIAGNOSIS — N3000 Acute cystitis without hematuria: Secondary | ICD-10-CM | POA: Diagnosis not present

## 2022-05-16 ENCOUNTER — Other Ambulatory Visit: Payer: Self-pay | Admitting: Internal Medicine

## 2022-05-16 DIAGNOSIS — J449 Chronic obstructive pulmonary disease, unspecified: Secondary | ICD-10-CM

## 2022-05-25 ENCOUNTER — Other Ambulatory Visit: Payer: Self-pay | Admitting: Internal Medicine

## 2022-05-28 ENCOUNTER — Encounter: Payer: Self-pay | Admitting: Internal Medicine

## 2022-05-28 ENCOUNTER — Ambulatory Visit (INDEPENDENT_AMBULATORY_CARE_PROVIDER_SITE_OTHER): Payer: Medicare Other | Admitting: Internal Medicine

## 2022-05-28 VITALS — BP 142/72 | HR 70 | Temp 97.7°F | Ht 66.0 in | Wt 143.0 lb

## 2022-05-28 DIAGNOSIS — R5383 Other fatigue: Secondary | ICD-10-CM

## 2022-05-28 DIAGNOSIS — G8929 Other chronic pain: Secondary | ICD-10-CM

## 2022-05-28 DIAGNOSIS — N816 Rectocele: Secondary | ICD-10-CM | POA: Diagnosis not present

## 2022-05-28 DIAGNOSIS — E785 Hyperlipidemia, unspecified: Secondary | ICD-10-CM | POA: Diagnosis not present

## 2022-05-28 DIAGNOSIS — F419 Anxiety disorder, unspecified: Secondary | ICD-10-CM

## 2022-05-28 DIAGNOSIS — M5440 Lumbago with sciatica, unspecified side: Secondary | ICD-10-CM | POA: Diagnosis not present

## 2022-05-28 LAB — TSH: TSH: 2.34 u[IU]/mL (ref 0.35–5.50)

## 2022-05-28 LAB — LIPID PANEL
Cholesterol: 197 mg/dL (ref 0–200)
HDL: 59.9 mg/dL (ref >39.00)
LDL Cholesterol: 120 mg/dL — ABNORMAL HIGH (ref 0–99)
NonHDL: 137.58
Total CHOL/HDL Ratio: 3
Triglycerides: 86 mg/dL (ref 0.0–149.0)
VLDL: 17.2 mg/dL (ref 0.0–40.0)

## 2022-05-28 LAB — URINALYSIS, ROUTINE W REFLEX MICROSCOPIC
Bilirubin Urine: NEGATIVE
Ketones, ur: NEGATIVE
Nitrite: NEGATIVE
Specific Gravity, Urine: 1.025 (ref 1.000–1.030)
Total Protein, Urine: NEGATIVE
Urine Glucose: NEGATIVE
Urobilinogen, UA: 0.2 (ref 0.0–1.0)
pH: 5.5 (ref 5.0–8.0)

## 2022-05-28 LAB — COMPREHENSIVE METABOLIC PANEL
ALT: 9 U/L (ref 0–35)
AST: 14 U/L (ref 0–37)
Albumin: 4.3 g/dL (ref 3.5–5.2)
Alkaline Phosphatase: 75 U/L (ref 39–117)
BUN: 14 mg/dL (ref 6–23)
CO2: 27 mEq/L (ref 19–32)
Calcium: 9.7 mg/dL (ref 8.4–10.5)
Chloride: 105 mEq/L (ref 96–112)
Creatinine, Ser: 0.84 mg/dL (ref 0.40–1.20)
GFR: 71.86 mL/min (ref >60.00)
Glucose, Bld: 83 mg/dL (ref 70–99)
Potassium: 4 mEq/L (ref 3.5–5.1)
Sodium: 141 mEq/L (ref 135–145)
Total Bilirubin: 0.9 mg/dL (ref 0.2–1.2)
Total Protein: 7.3 g/dL (ref 6.0–8.3)

## 2022-05-28 LAB — CBC WITH DIFFERENTIAL/PLATELET
Basophils Absolute: 0 10*3/uL (ref 0.0–0.1)
Basophils Relative: 0.7 % (ref 0.0–3.0)
Eosinophils Absolute: 0 10*3/uL (ref 0.0–0.7)
Eosinophils Relative: 0.3 % (ref 0.0–5.0)
HCT: 40.5 % (ref 36.0–46.0)
Hemoglobin: 13.4 g/dL (ref 12.0–15.0)
Lymphocytes Relative: 21.6 % (ref 12.0–46.0)
Lymphs Abs: 1.2 10*3/uL (ref 0.7–4.0)
MCHC: 33.1 g/dL (ref 30.0–36.0)
MCV: 87.5 fl (ref 78.0–100.0)
Monocytes Absolute: 0.4 10*3/uL (ref 0.1–1.0)
Monocytes Relative: 6.8 % (ref 3.0–12.0)
Neutro Abs: 4 10*3/uL (ref 1.4–7.7)
Neutrophils Relative %: 70.6 % (ref 43.0–77.0)
Platelets: 160 10*3/uL (ref 150.0–400.0)
RBC: 4.63 Mil/uL (ref 3.87–5.11)
RDW: 14.4 % (ref 11.5–15.5)
WBC: 5.7 10*3/uL (ref 4.0–10.5)

## 2022-05-28 MED ORDER — LIDOCAINE 5 % EX PTCH: 1.0000 | MEDICATED_PATCH | CUTANEOUS | 3 refills | Status: DC

## 2022-05-28 NOTE — Assessment & Plan Note (Signed)
Stable F/u w/Dr [name] Cont on Seroquel, Xanax prn

## 2022-05-28 NOTE — Assessment & Plan Note (Signed)
Check UA 2017 s/p pelvic floor repair Dr Quincy Simmonds

## 2022-05-28 NOTE — Assessment & Plan Note (Signed)
Percocet prn  Potential benefits of a long term opioids use as well as potential risks (i.e. addiction risk, apnea etc) and complications (i.e. Somnolence, constipation and others) were explained to the patient and were aknowledged.  Potential benefits of a long term NSAID  use as well as potential risks  and complications were explained to the patient and were aknowledged.

## 2022-05-28 NOTE — Assessment & Plan Note (Signed)
CFS - Multifactorial

## 2022-05-28 NOTE — Progress Notes (Signed)
Subjective:  Patient ID: Melody [name], female    DOB: [birth year]  Age: 66 y.o. MRN: [id number]  CC: No chief complaint on file.   HPI Melody [name] presents for LBP, anxiety, bronchiectases C/o pain between shoulder blades  Outpatient Medications Prior to Visit  Medication Sig Dispense Refill   albuterol (VENTOLIN HFA) 108 (90 Base) MCG/ACT inhaler Inhale 2 puffs into the lungs every 4 (four) hours as needed for wheezing or shortness of breath. 8.5 g 5   ALPRAZolam (XANAX) 1 MG tablet Take 1 mg by mouth 4 (four) times daily.     Azelastine-Fluticasone (DYMISTA) 137-50 MCG/ACT SUSP Place 1 spray 2 (two) times daily into the nose. 1 Bottle 3   Cholecalciferol (VITAMIN D3) 1.25 MG (50000 UT) CAPS TAKE ONE CAPSULE BY MOUTH EVERY 14 DAYS 6 capsule 3   cyanocobalamin (,VITAMIN B-12,) 1000 MCG/ML injection INJECT 1ML INTO THE SKIN EVERY 14 DAYS 6 mL 3   diclofenac Sodium (VOLTAREN) 1 % GEL APPLY TWO GRAMS TO AFFECTED AREA(S) FOUR TIMES DAILY 200 g 3   DULERA 100-5 MCG/ACT AERO inhale TWO puffs first thing in THE morning THEN another TWO puffs 12 hours LATER 13 g 3   FLUAD QUADRIVALENT 0.5 ML injection      fluticasone (FLONASE) 50 MCG/ACT nasal spray INSTILL TWO SPRAYS IN EACH NOSTRIL DAILY 48 g 3   levocetirizine (XYZAL) 5 MG tablet TAKE 1 TABLET BY MOUTH EVERY EVENING (FOR ALLERGY) 90 tablet 3   levofloxacin (LEVAQUIN) 500 MG tablet TAKE 1 TABLET BY MOUTH DAILY 7 tablet 0   levothyroxine (SYNTHROID) 50 MCG tablet Take 1 tablet (50 mcg total) by mouth daily before breakfast. 90 tablet 3   methylphenidate (RITALIN) 20 MG tablet Take 1 tablet (20 mg total) by mouth daily.  0   oxyCODONE-acetaminophen (PERCOCET/ROXICET) 5-325 MG tablet TAKE 1 TABLET BY MOUTH EVERY 8 HOURS AS NEEDED FOR SEVERE PAIN 90 tablet 0   pantoprazole (PROTONIX) 40 MG tablet Take 1 tablet (40 mg total) by mouth 2 (two) times daily. 180 tablet 3   PNEUMOVAX 23 25 MCG/0.5ML injection      promethazine (PHENERGAN) 25 MG  tablet TAKE 1/2 TO 1 TABLET BY MOUTH EVERY 8 HOURS AS NEEDED FOR NAUSEA AND VOMITING 20 tablet 1   promethazine-dextromethorphan (PROMETHAZINE-DM) 6.25-15 MG/5ML syrup Take 5 mLs by mouth 4 (four) times daily as needed for cough. 240 mL 0   QUEtiapine (SEROQUEL) 25 MG tablet Take 25 mg by mouth at bedtime.     Respiratory Therapy Supplies (FLUTTER) DEVI Use as directed 1 each 0   rizatriptan (MAXALT) 10 MG tablet Take 1 tablet (10 mg total) by mouth once as needed for up to 1 dose for migraine. May repeat in 2 hours if needed 12 tablet 5   triamcinolone ointment (KENALOG) 0.1 % Apply topically 2 (two) times daily as needed. to affected area(s) 80 g 3   clotrimazole (MYCELEX) 10 MG troche Take 1 tablet (10 mg total) by mouth 5 (five) times daily. (Patient not taking: Reported on 02/22/2022) 35 Troche 11   No facility-administered medications prior to visit.    ROS: Review of Systems  Constitutional:  Negative for activity change, appetite change, chills, fatigue and unexpected weight change.  HENT:  Negative for congestion, mouth sores and sinus pressure.   Eyes:  Negative for visual disturbance.  Respiratory:  Negative for cough and chest tightness.   Gastrointestinal:  Negative for abdominal pain and nausea.  Genitourinary:  Negative for  difficulty urinating, frequency and vaginal pain.  Musculoskeletal:  Positive for arthralgias and back pain. Negative for gait problem.  Skin:  Negative for pallor and rash.  Neurological:  Negative for dizziness, tremors, weakness, numbness and headaches.  Psychiatric/Behavioral:  Positive for dysphoric mood. Negative for confusion, sleep disturbance and suicidal ideas. The patient is nervous/anxious.     Objective:  BP (!) 142/72 (BP Location: Left Arm, Patient Position: Sitting, Cuff Size: Normal)   Pulse 70   Temp 97.7 F (36.5 C) (Oral)   Ht '5\' 6"'$  (1.676 m)   Wt 143 lb (64.9 kg)   LMP 10/29/2006 (Approximate)   SpO2 98%   BMI 23.08 kg/m   BP  Readings from Last 3 Encounters:  05/28/22 (!) 142/72  02/22/22 136/74  12/25/21 122/78    Wt Readings from Last 3 Encounters:  05/28/22 143 lb (64.9 kg)  02/22/22 143 lb 4.8 oz (65 kg)  12/25/21 141 lb (64 kg)    Physical Exam Constitutional:      General: She is not in acute distress.    Appearance: Normal appearance. She is well-developed.  HENT:     Head: Normocephalic.     Right Ear: External ear normal.     Left Ear: External ear normal.     Nose: Nose normal.  Eyes:     General:        Right eye: No discharge.        Left eye: No discharge.     Conjunctiva/sclera: Conjunctivae normal.     Pupils: Pupils are equal, round, and reactive to Fessel.  Neck:     Thyroid: No thyromegaly.     Vascular: No JVD.     Trachea: No tracheal deviation.  Cardiovascular:     Rate and Rhythm: Normal rate and regular rhythm.     Heart sounds: Normal heart sounds.  Pulmonary:     Effort: No respiratory distress.     Breath sounds: No stridor. No wheezing.  Abdominal:     General: Bowel sounds are normal. There is no distension.     Palpations: Abdomen is soft. There is no mass.     Tenderness: There is no abdominal tenderness. There is no guarding or rebound.  Musculoskeletal:        General: Tenderness present.     Cervical back: Normal range of motion and neck supple. No rigidity.  Lymphadenopathy:     Cervical: No cervical adenopathy.  Skin:    Findings: No erythema or rash.  Neurological:     Mental Status: She is oriented to person, place, and time.     Cranial Nerves: No cranial nerve deficit.     Motor: No abnormal muscle tone.     Coordination: Coordination normal.     Gait: Gait normal.     Deep Tendon Reflexes: Reflexes normal.  Psychiatric:        Behavior: Behavior normal.        Thought Content: Thought content normal.        Judgment: Judgment normal.   LS w/pain  Lab Results  Component Value Date   WBC 6.3 07/06/2020   HGB 13.5 07/06/2020   HCT 40.7  07/06/2020   PLT 168 07/06/2020   GLUCOSE 76 07/06/2020   CHOL 180 05/16/2018   TRIG 67.0 05/16/2018   HDL 69.80 05/16/2018   LDLCALC 96 05/16/2018   ALT 7 07/06/2020   AST 12 07/06/2020   NA 142 07/06/2020   K 3.7 07/06/2020  CL 103 07/06/2020   CREATININE 0.91 07/06/2020   BUN 9 07/06/2020   CO2 30 07/06/2020   TSH 3.21 07/06/2020    No results found.  Assessment & Plan:   Problem List Items Addressed This Visit     Anxiety disorder    Stable F/u w/Dr [name] Cont on Seroquel, Xanax prn      Relevant Orders   TSH   CBC with Differential/Platelet   Lipid panel   Comprehensive metabolic panel   Fatigue    CFS - Multifactorial      Relevant Orders   TSH   CBC with Differential/Platelet   Lipid panel   Comprehensive metabolic panel   Low back pain     Percocet prn  Potential benefits of a long term opioids use as well as potential risks (i.e. addiction risk, apnea etc) and complications (i.e. Somnolence, constipation and others) were explained to the patient and were aknowledged.  Potential benefits of a long term NSAID  use as well as potential risks  and complications were explained to the patient and were aknowledged.      Rectocele    Check UA 2017 s/p pelvic floor repair Dr Quincy Simmonds      Relevant Orders   Urinalysis   Other Visit Diagnoses     Dyslipidemia    -  Primary   Relevant Orders   TSH   Lipid panel         Meds ordered this encounter  Medications   lidocaine (LIDODERM) 5 %    Sig: Place 1-2 patches onto the skin daily. Remove & Discard patch within 12 hours or as directed by MD    Dispense:  60 patch    Refill:  3      Follow-up: Return in about 3 months (around 08/28/2022) for a follow-up visit.  Walker Kehr, MD

## 2022-05-29 ENCOUNTER — Telehealth: Payer: Self-pay | Admitting: *Deleted

## 2022-05-29 NOTE — Telephone Encounter (Signed)
Rec'd determination med was APPROVED. Effective 05/29/22 through 10/28/2022. Faxed approval to [employer].Marland KitchenJohny Chess

## 2022-05-29 NOTE — Telephone Encounter (Signed)
Rec'd fax pt need PA on Lidocaine 5% Patches. Submitted vis cover-my-meds w/ (Key: [id number]). PA has been sent to [place].Marland KitchenJohny Chess

## 2022-06-17 ENCOUNTER — Other Ambulatory Visit: Payer: Self-pay | Admitting: Internal Medicine

## 2022-06-17 DIAGNOSIS — J449 Chronic obstructive pulmonary disease, unspecified: Secondary | ICD-10-CM

## 2022-06-26 DIAGNOSIS — H5213 Myopia, bilateral: Secondary | ICD-10-CM | POA: Diagnosis not present

## 2022-07-16 DIAGNOSIS — H524 Presbyopia: Secondary | ICD-10-CM | POA: Diagnosis not present

## 2022-07-16 DIAGNOSIS — H52223 Regular astigmatism, bilateral: Secondary | ICD-10-CM | POA: Diagnosis not present

## 2022-07-26 ENCOUNTER — Other Ambulatory Visit: Payer: Self-pay | Admitting: Internal Medicine

## 2022-08-22 ENCOUNTER — Other Ambulatory Visit: Payer: Self-pay | Admitting: Internal Medicine

## 2022-08-29 ENCOUNTER — Ambulatory Visit (INDEPENDENT_AMBULATORY_CARE_PROVIDER_SITE_OTHER): Payer: Medicare Other | Admitting: Internal Medicine

## 2022-08-29 ENCOUNTER — Encounter: Payer: Self-pay | Admitting: Internal Medicine

## 2022-08-29 VITALS — BP 132/72 | HR 67 | Temp 98.2°F | Ht 66.0 in | Wt 147.2 lb

## 2022-08-29 DIAGNOSIS — H9193 Unspecified hearing loss, bilateral: Secondary | ICD-10-CM | POA: Diagnosis not present

## 2022-08-29 DIAGNOSIS — A310 Pulmonary mycobacterial infection: Secondary | ICD-10-CM

## 2022-08-29 DIAGNOSIS — M25512 Pain in left shoulder: Secondary | ICD-10-CM | POA: Diagnosis not present

## 2022-08-29 DIAGNOSIS — E538 Deficiency of other specified B group vitamins: Secondary | ICD-10-CM | POA: Diagnosis not present

## 2022-08-29 DIAGNOSIS — H9190 Unspecified hearing loss, unspecified ear: Secondary | ICD-10-CM | POA: Insufficient documentation

## 2022-08-29 DIAGNOSIS — J449 Chronic obstructive pulmonary disease, unspecified: Secondary | ICD-10-CM | POA: Diagnosis not present

## 2022-08-29 DIAGNOSIS — G43019 Migraine without aura, intractable, without status migrainosus: Secondary | ICD-10-CM | POA: Diagnosis not present

## 2022-08-29 DIAGNOSIS — F419 Anxiety disorder, unspecified: Secondary | ICD-10-CM

## 2022-08-29 MED ORDER — SPIRIVA RESPIMAT 2.5 MCG/ACT IN AERS: 2.0000 | INHALATION_SPRAY | Freq: Every day | RESPIRATORY_TRACT | 11 refills | Status: DC

## 2022-08-29 NOTE — Assessment & Plan Note (Signed)
F/u w/Dr Kaur Cont on Seroquel, Xanax prn 

## 2022-08-29 NOTE — Assessment & Plan Note (Addendum)
Vaccinated - Prevnar 20, RSV, flu Recurrent thrush w/Dulera - d/c Rx Spiriva respimat qd and Albuterol prn

## 2022-08-29 NOTE — Assessment & Plan Note (Signed)
Chronic migraines.  8/22 - recent severe headache, likely triggered by stress.

## 2022-08-29 NOTE — Assessment & Plan Note (Signed)
On B12 sq q 2 wks

## 2022-08-29 NOTE — Assessment & Plan Note (Signed)
W/[facility] ref

## 2022-08-29 NOTE — Assessment & Plan Note (Signed)
Chronic. F/u w/Ortho

## 2022-08-29 NOTE — Assessment & Plan Note (Signed)
Doing well Vaccinated - Prevnar 20, RSV, flu

## 2022-08-29 NOTE — Progress Notes (Signed)
Subjective:  Patient ID: Melody [name], female    DOB: [birth year]  Age: 66 y.o. MRN: [id number]  CC: Follow-up (3 month f/u)   HPI THELDA GAGAN presents for anxiety, HA, LBP  Outpatient Medications Prior to Visit  Medication Sig Dispense Refill   albuterol (VENTOLIN HFA) 108 (90 Base) MCG/ACT inhaler INHALE TWO PUFFS BY MOUTH EVERY 4 HOURS AS NEEDED FOR wheezing OR SHORTNESS OF BREATH 8.5 g 5   ALPRAZolam (XANAX) 1 MG tablet Take 1 mg by mouth 4 (four) times daily.     Azelastine-Fluticasone (DYMISTA) 137-50 MCG/ACT SUSP Place 1 spray 2 (two) times daily into the nose. 1 Bottle 3   Cholecalciferol (VITAMIN D3) 1.25 MG (50000 UT) CAPS TAKE ONE CAPSULE BY MOUTH EVERY 14 DAYS 6 capsule 3   cyanocobalamin (VITAMIN B12) 1000 MCG/ML injection INJECT 1ML INTO THE SKIN EVERY 14 DAYS 6 mL 3   diclofenac Sodium (VOLTAREN) 1 % GEL APPLY TWO GRAMS TO AFFECTED AREA(S) FOUR TIMES DAILY 200 g 3   fluticasone (FLONASE) 50 MCG/ACT nasal spray INSTILL TWO SPRAYS IN EACH NOSTRIL DAILY 48 g 3   levocetirizine (XYZAL) 5 MG tablet TAKE 1 TABLET BY MOUTH EVERY EVENING (FOR ALLERGY) 90 tablet 3   levofloxacin (LEVAQUIN) 500 MG tablet TAKE 1 TABLET BY MOUTH DAILY 7 tablet 0   levothyroxine (SYNTHROID) 50 MCG tablet Take 1 tablet (50 mcg total) by mouth daily before breakfast. 90 tablet 3   lidocaine (LIDODERM) 5 % Place 1-2 patches onto the skin daily. Remove & Discard patch within 12 hours or as directed by MD 60 patch 3   methylphenidate (RITALIN) 20 MG tablet Take 1 tablet (20 mg total) by mouth daily.  0   oxyCODONE-acetaminophen (PERCOCET/ROXICET) 5-325 MG tablet TAKE 1 TABLET BY MOUTH EVERY 8 HOURS AS NEEDED FOR SEVERE PAIN 90 tablet 0   pantoprazole (PROTONIX) 40 MG tablet Take 1 tablet (40 mg total) by mouth 2 (two) times daily. 180 tablet 3   PNEUMOVAX 23 25 MCG/0.5ML injection      promethazine (PHENERGAN) 25 MG tablet TAKE 1/2 TO 1 TABLET BY MOUTH EVERY 8 HOURS AS NEEDED FOR NAUSEA AND VOMITING 20  tablet 1   promethazine-dextromethorphan (PROMETHAZINE-DM) 6.25-15 MG/5ML syrup Take 5 mLs by mouth 4 (four) times daily as needed for cough. 240 mL 0   QUEtiapine (SEROQUEL) 25 MG tablet Take 25 mg by mouth at bedtime.     Respiratory Therapy Supplies (FLUTTER) DEVI Use as directed 1 each 0   rizatriptan (MAXALT) 10 MG tablet Take 1 tablet (10 mg total) by mouth once as needed for up to 1 dose for migraine. May repeat in 2 hours if needed 12 tablet 5   triamcinolone ointment (KENALOG) 0.1 % Apply topically 2 (two) times daily as needed. to affected area(s) 80 g 3   DULERA 100-5 MCG/ACT AERO inhale TWO puffs first thing in THE morning THEN another TWO puffs 12 hours LATER 13 g 3   clotrimazole (MYCELEX) 10 MG troche Take 1 tablet (10 mg total) by mouth 5 (five) times daily. (Patient not taking: Reported on 02/22/2022) 35 Troche 11   FLUAD QUADRIVALENT 0.5 ML injection  (Patient not taking: Reported on 08/29/2022)     No facility-administered medications prior to visit.    ROS: Review of Systems  Constitutional:  Negative for activity change, appetite change, chills, fatigue and unexpected weight change.  HENT:  Negative for congestion, mouth sores and sinus pressure.   Eyes:  Negative for  visual disturbance.  Respiratory:  Positive for cough. Negative for chest tightness and wheezing.   Gastrointestinal:  Negative for abdominal pain and nausea.  Genitourinary:  Negative for difficulty urinating, frequency and vaginal pain.  Musculoskeletal:  Positive for arthralgias and back pain. Negative for gait problem.  Skin:  Negative for pallor and rash.  Neurological:  Positive for headaches. Negative for dizziness, tremors, weakness and numbness.  Psychiatric/Behavioral:  Negative for confusion, sleep disturbance and suicidal ideas.     Objective:  BP 132/72 (BP Location: Left Arm)   Pulse 67   Temp 98.2 F (36.8 C) (Oral)   Ht '5\' 6"'$  (1.676 m)   Wt 147 lb 3.2 oz (66.8 kg)   LMP 10/29/2006  (Approximate)   SpO2 96%   BMI 23.76 kg/m   BP Readings from Last 3 Encounters:  08/29/22 132/72  05/28/22 (!) 142/72  02/22/22 136/74    Wt Readings from Last 3 Encounters:  08/29/22 147 lb 3.2 oz (66.8 kg)  05/28/22 143 lb (64.9 kg)  02/22/22 143 lb 4.8 oz (65 kg)    Physical Exam Constitutional:      General: She is not in acute distress.    Appearance: Normal appearance. She is well-developed.  HENT:     Head: Normocephalic.     Right Ear: External ear normal.     Left Ear: External ear normal.     Nose: Nose normal.  Eyes:     General:        Right eye: No discharge.        Left eye: No discharge.     Conjunctiva/sclera: Conjunctivae normal.     Pupils: Pupils are equal, round, and reactive to [place].  Neck:     Thyroid: No thyromegaly.     Vascular: No JVD.     Trachea: No tracheal deviation.  Cardiovascular:     Rate and Rhythm: Normal rate and regular rhythm.     Heart sounds: Normal heart sounds.  Pulmonary:     Effort: No respiratory distress.     Breath sounds: No stridor. No wheezing.  Abdominal:     General: Bowel sounds are normal. There is no distension.     Palpations: Abdomen is soft. There is no mass.     Tenderness: There is no abdominal tenderness. There is no guarding or rebound.  Musculoskeletal:        General: Tenderness present.     Cervical back: Normal range of motion and neck supple. No rigidity.     Right lower leg: No edema.     Left lower leg: No edema.  Lymphadenopathy:     Cervical: No cervical adenopathy.  Skin:    Findings: No erythema or rash.  Neurological:     Mental Status: She is oriented to person, place, and time.     Cranial Nerves: No cranial nerve deficit.     Motor: No abnormal muscle tone.     Coordination: Coordination normal.     Deep Tendon Reflexes: Reflexes normal.  Psychiatric:        Behavior: Behavior normal.        Thought Content: Thought content normal.        Judgment: Judgment normal.    shoulder  Lab Results  Component Value Date   WBC 5.7 05/28/2022   HGB 13.4 05/28/2022   HCT 40.5 05/28/2022   PLT 160.0 05/28/2022   GLUCOSE 83 05/28/2022   CHOL 197 05/28/2022   TRIG 86.0 05/28/2022  HDL 59.90 05/28/2022   LDLCALC 120 (H) 05/28/2022   ALT 9 05/28/2022   AST 14 05/28/2022   NA 141 05/28/2022   K 4.0 05/28/2022   CL 105 05/28/2022   CREATININE 0.84 05/28/2022   BUN 14 05/28/2022   CO2 27 05/28/2022   TSH 2.34 05/28/2022    No results found.  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  8/22 - recent severe headache, likely triggered by stress.      Shoulder pain    Chronic. F/u w/Ortho         Meds ordered this encounter  Medications   Tiotropium Bromide Monohydrate (SPIRIVA RESPIMAT) 2.5 MCG/ACT AERS    Sig: Inhale 2 puffs into the lungs daily.    Dispense:  4 g    Refill:  11      Follow-up: Return in about 3 months (around 11/29/2022) for a follow-up visit.  Walker Kehr, MD

## 2022-09-10 ENCOUNTER — Ambulatory Visit (INDEPENDENT_AMBULATORY_CARE_PROVIDER_SITE_OTHER): Payer: Medicare Other | Admitting: Obstetrics and Gynecology

## 2022-09-10 ENCOUNTER — Encounter: Payer: Self-pay | Admitting: Obstetrics and Gynecology

## 2022-09-10 ENCOUNTER — Telehealth: Payer: Self-pay | Admitting: Obstetrics and Gynecology

## 2022-09-10 VITALS — BP 132/80 | HR 68 | Ht 66.0 in | Wt 146.0 lb

## 2022-09-10 DIAGNOSIS — N3281 Overactive bladder: Secondary | ICD-10-CM

## 2022-09-10 DIAGNOSIS — R102 Pelvic and perineal pain: Secondary | ICD-10-CM

## 2022-09-10 DIAGNOSIS — R197 Diarrhea, unspecified: Secondary | ICD-10-CM

## 2022-09-10 DIAGNOSIS — R140 Abdominal distension (gaseous): Secondary | ICD-10-CM | POA: Diagnosis not present

## 2022-09-10 MED ORDER — OXYBUTYNIN CHLORIDE ER 10 MG PO TB24: 10.0000 mg | ORAL_TABLET | Freq: Every day | ORAL | 1 refills | Status: DC

## 2022-09-10 NOTE — Progress Notes (Signed)
66 y.o. G2P2002 Legally Separated Caucasian female here for hematuria, pelvic pain, abdominal pain, bloated, gas, and diarrhea.  She is reporting hx vaginal discharge, has had some UTIs. No vaginal discharge today.  Has been going to urgent care.   Not sexually active since separating from her husband.   Is having urinary incontinence and difficulty controlling diarrhea.  No leakage of urine with cough or laugh.  Leakage occurs without warming.  Washing her hands prompts the urination.   Not using caffeine.   She is having difficulty with her home situation and not finding support through social services.   PCP:   Dr. Alain Marion.   Patient's last menstrual period was 10/29/2006 (approximate).           Sexually active: No.  The current method of family planning is post menopausal status.    Exercising: No.   Smoker:    Health Maintenance: Pap:  08/10/15 normal,:no HR HPV done   History of abnormal Pap:  no MMG:   01/30/17 BIRADS 4:Suspicious/density c; 02/12/17 Right breast biopsy -- see Epic for details.  Fibrocystic change with calcifications. Return to annual screening. Colonoscopy:  Thinks she had one with [place] and had benign colon polyps:unsure of date or doctor. Refuses to have another one. She does yearly hemoccults with PCP and it was negative 09/2016. Colonoscopy:   Thinks she had one with [facility] and had benign colon polyps:unsure of date or doctor. Refuses to have another one. She does yearly hemoccults with PCP and it was negative 09/2016.  BMD:   n/a Screening Labs:  PCP   reports that she quit smoking about 11 years ago. Her smoking use included cigarettes. She started smoking about 59 years ago. She has a 35.00 pack-year smoking history. She has never used smokeless tobacco. She reports that she does not drink alcohol and does not use drugs.  Past Medical History:  Diagnosis Date   Anxiety    Bronchiectasis (HCC)    Chronic back pain    Chronic neck pain     Depression    bipolar- Dr [name]   Fatigue    Fibromyalgia    GERD (gastroesophageal reflux disease)    Headache    Hypothyroidism    Low back pain    Dr Arnoldo Morale   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[facility])    Osteoarthritis    Pneumonia    Pulmonary nodule    Sinus congestion    Thyroid disease    hypothyroidsm   Vertigo    Vitamin B12 deficiency     Past Surgical History:  Procedure Laterality Date   ABDOMINAL HYSTERECTOMY N/A 01/10/2016   Procedure: HYSTERECTOMY ABDOMINAL;  Surgeon: Nunzio Cobbs, MD;  Location: [facility];  Service: Gynecology;  Laterality: N/A;   ABDOMINAL SACROCOLPOPEXY N/A 01/10/2016   Procedure: ABDOMINO SACROCOLPOPEXY ;  Surgeon: Nunzio Cobbs, MD;  Location: [facility];  Service: Gynecology;  Laterality: N/A;   ANTERIOR AND POSTERIOR REPAIR N/A 01/10/2016   Procedure:  POSTERIOR REPAIR (RECTOCELE);  Surgeon: Nunzio Cobbs, MD;  Location: [facility];  Service: Gynecology;  Laterality: N/A;   BLADDER SUSPENSION N/A 01/10/2016   Procedure: TRANSVAGINAL TAPE (TVT) PROCEDURE exact midurethral sling;  Surgeon: Nunzio Cobbs, MD;  Location: [facility];  Service: Gynecology;  Laterality: N/A;   CERVICAL LAMINECTOMY  2001 and 2995   Dr Harmon Pier N/A 01/10/2016   Procedure: CYSTOSCOPY;  Surgeon: Nunzio Cobbs, MD;  Location: [facility];  Service: Gynecology;  Laterality: N/A;   SALPINGOOPHORECTOMY Bilateral 01/10/2016   Procedure: BILATERAL SALPINGO OOPHORECTOMY;  Surgeon: Nunzio Cobbs, MD;  Location: [facility];  Service: Gynecology;  Laterality: Bilateral;   TUBAL LIGATION  1980    Current Outpatient Medications  Medication Sig Dispense Refill   albuterol (VENTOLIN HFA) 108 (90 Base) MCG/ACT inhaler INHALE TWO PUFFS BY MOUTH EVERY 4 HOURS AS NEEDED FOR wheezing OR SHORTNESS OF BREATH 8.5 g 5   ALPRAZolam (XANAX) 1 MG tablet Take 1 mg by mouth 4 (four) times daily.     Azelastine-Fluticasone (DYMISTA) 137-50  MCG/ACT SUSP Place 1 spray 2 (two) times daily into the nose. 1 Bottle 3   Cholecalciferol (VITAMIN D3) 1.25 MG (50000 UT) CAPS TAKE ONE CAPSULE BY MOUTH EVERY 14 DAYS 6 capsule 3   cyanocobalamin (VITAMIN B12) 1000 MCG/ML injection INJECT 1ML INTO THE SKIN EVERY 14 DAYS 6 mL 3   diclofenac Sodium (VOLTAREN) 1 % GEL APPLY TWO GRAMS TO AFFECTED AREA(S) FOUR TIMES DAILY 200 g 3   fluticasone (FLONASE) 50 MCG/ACT nasal spray INSTILL TWO SPRAYS IN EACH NOSTRIL DAILY 48 g 3   levocetirizine (XYZAL) 5 MG tablet TAKE 1 TABLET BY MOUTH EVERY EVENING (FOR ALLERGY) 90 tablet 3   levofloxacin (LEVAQUIN) 500 MG tablet TAKE 1 TABLET BY MOUTH DAILY 7 tablet 0   levothyroxine (SYNTHROID) 50 MCG tablet Take 1 tablet (50 mcg total) by mouth daily before breakfast. 90 tablet 3   lidocaine (LIDODERM) 5 % Place 1-2 patches onto the skin daily. Remove & Discard patch within 12 hours or as directed by MD 60 patch 3   methylphenidate (RITALIN) 20 MG tablet Take 1 tablet (20 mg total) by mouth daily.  0   oxyCODONE-acetaminophen (PERCOCET/ROXICET) 5-325 MG tablet TAKE 1 TABLET BY MOUTH EVERY 8 HOURS AS NEEDED FOR SEVERE PAIN 90 tablet 0   pantoprazole (PROTONIX) 40 MG tablet Take 1 tablet (40 mg total) by mouth 2 (two) times daily. 180 tablet 3   PNEUMOVAX 23 25 MCG/0.5ML injection      promethazine (PHENERGAN) 25 MG tablet TAKE 1/2 TO 1 TABLET BY MOUTH EVERY 8 HOURS AS NEEDED FOR NAUSEA AND VOMITING 20 tablet 1   promethazine-dextromethorphan (PROMETHAZINE-DM) 6.25-15 MG/5ML syrup Take 5 mLs by mouth 4 (four) times daily as needed for cough. 240 mL 0   QUEtiapine (SEROQUEL) 25 MG tablet Take 25 mg by mouth at bedtime.     Respiratory Therapy Supplies (FLUTTER) DEVI Use as directed 1 each 0   rizatriptan (MAXALT) 10 MG tablet Take 1 tablet (10 mg total) by mouth once as needed for up to 1 dose for migraine. May repeat in 2 hours if needed 12 tablet 5   Tiotropium Bromide Monohydrate (SPIRIVA RESPIMAT) 2.5 MCG/ACT AERS  Inhale 2 puffs into the lungs daily. 4 g 11   triamcinolone ointment (KENALOG) 0.1 % Apply topically 2 (two) times daily as needed. to affected area(s) 80 g 3   No current facility-administered medications for this visit.    Family History  Problem Relation Age of Onset   Heart attack Father        dec heart disease age 34   Hypertension Father    Lung disease Father    Cancer Mother        dec stomach ca--age 67   Breast cancer Cousin    Arthritis Other    Breast cancer Paternal Aunt     Review of Systems  Gastrointestinal:  Positive for abdominal pain.  Genitourinary:  Positive for hematuria and pelvic pain.    Exam:   BP 132/80 (BP Location: Right Arm, Patient Position: Sitting, Cuff Size: Normal)   Pulse 68   Ht '5\' 6"'$  (1.676 m) Comment: pt reported  Wt 146 lb (66.2 kg)   LMP 10/29/2006 (Approximate)   BMI 23.57 kg/m     General appearance: alert, cooperative and appears stated age Head: normocephalic, without obvious abnormality, atraumatic Lungs: clear to auscultation bilaterally Heart: regular rate and rhythm Abdomen: soft, non-tender; no masses, no organomegaly Extremities: extremities normal, atraumatic, no cyanosis or edema Skin: skin color, texture, turgor normal. No rashes or lesions Lymph nodes: cervical, supraclavicular, and axillary nodes normal. Neurologic: grossly normal  Pelvic: External genitalia:  no lesions              No abnormal inguinal nodes palpated.              Urethra:  normal appearing urethra with no masses, tenderness or lesions              Bartholins and Skenes: normal                 Vagina: normal appearing vagina with normal color and discharge, no lesions.  Good support of the vagina.  No mesh erosion or exposure.               Cervix: absent              Pap taken: No. Bimanual Exam:  Uterus:  absent.               Adnexa: no mass, fullness, tenderness              Rectal exam: yes.  Confirms.              Anus:  normal  sphincter tone, no lesions  Chaperone was present for exam:  Kimalexis  Assessment:   Status post TAH/BSO with sacrocolpopexy, anterior and posterior colporrhaphy, TVT sling and cystoscopy.  Pelvic pain.  Abdominal bloating.  Diarrhea.  Dysuria.  Overactive bladder.  Plan: Mammogram screening recommended.  [facility] Urinalysis sg 1.025, ph 6.0, 0 - 5 WBC, 3 - 10 RBC, 0 -5 squams, few bacteria. UC pending.  If UC is negative, then start Ditropan XL 10 mg daily and follow up 7 weeks from now.   Side effects discussed.  Referral to GI.  I recommend support from her spiritual community.   45 min  total time was spent for this patient encounter, including preparation, face-to-face counseling with the patient, coordination of care, and documentation of the encounter.

## 2022-09-10 NOTE — Telephone Encounter (Signed)
Please assist with scheduling an appointment with Dr. Laural Golden, GI, for abdominal bloating and diarrhea.

## 2022-09-11 NOTE — Telephone Encounter (Signed)
Referral placed by Dr. Quincy Simmonds.   Routing to [place] to f/u on referral.

## 2022-09-12 ENCOUNTER — Other Ambulatory Visit: Payer: Self-pay | Admitting: Internal Medicine

## 2022-09-12 LAB — URINALYSIS, COMPLETE W/RFL CULTURE
Glucose, UA: NEGATIVE
Hyaline Cast: NONE SEEN /LPF
Nitrites, Initial: NEGATIVE
Protein, ur: NEGATIVE
Specific Gravity, Urine: 1.025 (ref 1.001–1.035)
pH: 6 (ref 5.0–8.0)

## 2022-09-12 LAB — URINE CULTURE
MICRO NUMBER:: 14180102
Result:: NO GROWTH
SPECIMEN QUALITY:: ADEQUATE

## 2022-09-12 LAB — CULTURE INDICATED

## 2022-09-12 NOTE — Telephone Encounter (Signed)
Pls advise if pt should continue taking prescription Vitamin D../lm,b

## 2022-09-17 ENCOUNTER — Encounter (INDEPENDENT_AMBULATORY_CARE_PROVIDER_SITE_OTHER): Payer: Self-pay | Admitting: Gastroenterology

## 2022-09-17 ENCOUNTER — Ambulatory Visit (INDEPENDENT_AMBULATORY_CARE_PROVIDER_SITE_OTHER): Payer: Medicare Other | Admitting: Gastroenterology

## 2022-09-17 VITALS — BP 157/83 | HR 63 | Temp 97.5°F | Ht 66.0 in | Wt 149.3 lb

## 2022-09-17 DIAGNOSIS — R109 Unspecified abdominal pain: Secondary | ICD-10-CM | POA: Insufficient documentation

## 2022-09-17 DIAGNOSIS — K219 Gastro-esophageal reflux disease without esophagitis: Secondary | ICD-10-CM | POA: Diagnosis not present

## 2022-09-17 DIAGNOSIS — R197 Diarrhea, unspecified: Secondary | ICD-10-CM | POA: Diagnosis not present

## 2022-09-17 DIAGNOSIS — R1030 Lower abdominal pain, unspecified: Secondary | ICD-10-CM | POA: Diagnosis not present

## 2022-09-17 DIAGNOSIS — R1310 Dysphagia, unspecified: Secondary | ICD-10-CM | POA: Diagnosis not present

## 2022-09-17 DIAGNOSIS — R6881 Early satiety: Secondary | ICD-10-CM | POA: Diagnosis not present

## 2022-09-17 NOTE — Patient Instructions (Addendum)
Schedule EGD and colonoscopy Increase pantoprazole to 40 mg twice a day Perform blood workup Can perform stool testing on Friday if persistent diarrhea The patient was found to have elevated blood pressure when vital signs were checked in the office. The blood pressure was rechecked by the nursing staff and it was found be persistently elevated >140/90 mmHg. I personally advised to the patient to follow up closely with PCP for hypertension control.

## 2022-09-17 NOTE — Progress Notes (Unsigned)
Melody [name], M.D. Gastroenterology & Hepatology [facility] [address] [place], [place] [address] Primary Care Physician: Cassandria Anger, MD [place] [address]  Referring MD: PCP  Chief Complaint: Throat discomfort, chest and abdominal fullness, abdominal pain and changes in her bowel movements  History of Present Illness: Melody [name] is a 66 y.o. female with past medical history of anxiety, depression, fibromyalgia, GERD, hypothyroidism, respiratory MAI, who presents for evaluation of throat discomfort, chest and abdominal fullness, abdominal pain and changes in her bowel movements.  Patient reports that roughly a month ago she started feeling recurrent sensation of discomfort in her throat, which forces her to try to swallow. She also reports that it does not get any better if she tries to swallow but it eventually subsides. She reports that she has felt very "full up to the chest". She endorses having frequent regurgitation of acid contents in her mouth, especially as she has been eating more overnight - however, she denies frequent dysphagia.  When I asked her again about the presence of dysphagia, she states having dysphagia episodes with steak intake, which gets better when she swallows water.  She has been taking Protonix 40 mg qday compliantly for GERD. She states she has been on multiple PPIs, can only remember Prevacid as she was on this one for long time.  Has had recurrent episodes of abdominal pain in her lower abdomen, on and off for the last couple of months.  Patient reports that she was awake all night long yesteday as she had multiple episodes of bowel movements. She did not have too much sleep due to this and believes this has led to elevation of her BP. Did not have to much food intake. She states the diarrhea started yesterday, she reports having regular bowel movements prior to this.  The  patient denies having any nausea, vomiting, fever, chills, hematochezia, melena, hematemesis,  jaundice, pruritus . She has actually been gaining some weight as she has been eating more than usual - she has been eating sweet foods before going to sleep in fact.  Last [id number] - Dr. Erskine Emery, normal exam Last Colonoscopy:>10 years ago, normal per patient  FHx: neg for any gastrointestinal/liver disease, mother gastric cancer - seems she had H. Pylori, cousin had breast cancer, aunt breast cancer Social: quit smoking 10 years ago,neg  alcohol or illicit drug use Surgical: cystopexy?, Complete hysterectomy, has had multiple neck surgeries  Past Medical History: Past Medical History:  Diagnosis Date   Anxiety    Bronchiectasis ([facility])    Chronic back pain    Chronic neck pain    Depression    bipolar- Dr [name]   Fatigue    Fibromyalgia    GERD (gastroesophageal reflux disease)    Headache    Hypothyroidism    Low back pain    Dr Arnoldo Morale   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[facility])    Osteoarthritis    Pneumonia    Pulmonary nodule    Sinus congestion    Thyroid disease    hypothyroidsm   Vertigo    Vitamin B12 deficiency     Past Surgical History: Past Surgical History:  Procedure Laterality Date   ABDOMINAL HYSTERECTOMY N/A 01/10/2016   Procedure: HYSTERECTOMY ABDOMINAL;  Surgeon: Nunzio Cobbs, MD;  Location: [facility];  Service: Gynecology;  Laterality: N/A;   ABDOMINAL SACROCOLPOPEXY N/A 01/10/2016   Procedure: ABDOMINO SACROCOLPOPEXY ;  Surgeon: Everardo All  Yisroel Ramming, MD;  Location: [facility];  Service: Gynecology;  Laterality: N/A;   ANTERIOR AND POSTERIOR REPAIR N/A 01/10/2016   Procedure:  POSTERIOR REPAIR (RECTOCELE);  Surgeon: Nunzio Cobbs, MD;  Location: [facility];  Service: Gynecology;  Laterality: N/A;   BLADDER SUSPENSION N/A 01/10/2016   Procedure: TRANSVAGINAL TAPE (TVT) PROCEDURE exact midurethral sling;  Surgeon: Nunzio Cobbs,  MD;  Location: [facility];  Service: Gynecology;  Laterality: N/A;   CERVICAL LAMINECTOMY  2001 and 2995   Dr Harmon Pier N/A 01/10/2016   Procedure: CYSTOSCOPY;  Surgeon: Nunzio Cobbs, MD;  Location: [facility];  Service: Gynecology;  Laterality: N/A;   SALPINGOOPHORECTOMY Bilateral 01/10/2016   Procedure: BILATERAL SALPINGO OOPHORECTOMY;  Surgeon: Nunzio Cobbs, MD;  Location: [facility];  Service: Gynecology;  Laterality: Bilateral;   TUBAL LIGATION  1980    Family History: Family History  Problem Relation Age of Onset   Heart attack Father        dec heart disease age 80   Hypertension Father    Lung disease Father    Cancer Mother        dec stomach ca--age 86   Breast cancer Cousin    Arthritis Other    Breast cancer Paternal Aunt     Social History: Social History   Tobacco Use  Smoking Status Former   Packs/day: 1.00   Years: 35.00   Total pack years: 35.00   Types: Cigarettes   Start date: 11/21/1962   Quit date: 05/06/2011   Years since quitting: 11.3  Smokeless Tobacco Never  Tobacco Comments   stopped periodically - peak rate of 1 ppd   Social History   Substance and Sexual Activity  Alcohol Use No   Alcohol/week: 0.0 standard drinks of alcohol   Social History   Substance and Sexual Activity  Drug Use No    Allergies: Allergies  Allergen Reactions   Flagyl [Metronidazole] Shortness Of Breath    Made tongue turn dark red, was not able to breath.   Doxycycline Hyclate Itching   Dulera [Mometasone Furo-Formoterol Fum]     thrush   Gabapentin     Headaches: Patient is not aware of the reaction to this medication   Nabumetone Other (See Comments)    Headache   Sulfa Antibiotics Other (See Comments)    Reaction is unknown   Tetracycline Hcl Itching   Tramadol Hcl Other (See Comments)    Reaction is unknown    Medications: Current Outpatient Medications  Medication Sig Dispense Refill   albuterol (VENTOLIN HFA) 108 (90 Base)  MCG/ACT inhaler INHALE TWO PUFFS BY MOUTH EVERY 4 HOURS AS NEEDED FOR wheezing OR SHORTNESS OF BREATH 8.5 g 5   ALPRAZolam (XANAX) 1 MG tablet Take 1 mg by mouth 3 (three) times daily as needed.     Cholecalciferol (VITAMIN D3) 1.25 MG (50000 UT) CAPS TAKE ONE CAPSULE BY MOUTH EVERY 14 DAYS 6 capsule 3   cyanocobalamin (VITAMIN B12) 1000 MCG/ML injection INJECT 1ML INTO THE SKIN EVERY 14 DAYS 6 mL 3   diclofenac Sodium (VOLTAREN) 1 % GEL APPLY TWO GRAMS TO AFFECTED AREA(S) FOUR TIMES DAILY (Patient taking differently: APPLY TWO GRAMS TO AFFECTED AREA(S) FOUR TIMES DAILY PRN) 200 g 3   fluticasone (FLONASE) 50 MCG/ACT nasal spray INSTILL TWO SPRAYS IN EACH NOSTRIL DAILY 48 g 3   levocetirizine (XYZAL) 5 MG tablet TAKE 1 TABLET BY MOUTH  EVERY EVENING (FOR ALLERGY) 90 tablet 3   levothyroxine (SYNTHROID) 50 MCG tablet Take 1 tablet (50 mcg total) by mouth daily before breakfast. 90 tablet 3   lidocaine (LIDODERM) 5 % Place 1-2 patches onto the skin daily. Remove & Discard patch within 12 hours or as directed by MD 60 patch 3   methylphenidate (RITALIN) 20 MG tablet Take 1 tablet (20 mg total) by mouth daily.  0   oxybutynin (DITROPAN XL) 10 MG 24 hr tablet Take 1 tablet (10 mg total) by mouth at bedtime. 30 tablet 1   pantoprazole (PROTONIX) 40 MG tablet Take 1 tablet (40 mg total) by mouth 2 (two) times daily. (Patient taking differently: Take 40 mg by mouth daily.) 180 tablet 3   promethazine (PHENERGAN) 25 MG tablet TAKE 1/2 TO 1 TABLET BY MOUTH EVERY 8 HOURS AS NEEDED FOR NAUSEA AND VOMITING 20 tablet 1   QUEtiapine (SEROQUEL) 25 MG tablet Take 25 mg by mouth at bedtime.     rizatriptan (MAXALT) 10 MG tablet Take 1 tablet (10 mg total) by mouth once as needed for up to 1 dose for migraine. May repeat in 2 hours if needed 12 tablet 5   triamcinolone ointment (KENALOG) 0.1 % Apply topically 2 (two) times daily as needed. to affected area(s) 80 g 3   No current facility-administered medications for  this visit.    Review of Systems: GENERAL: negative for malaise, night sweats HEENT: No changes in hearing or vision, no nose bleeds or other nasal problems. NECK: Negative for lumps, goiter, pain and significant neck swelling RESPIRATORY: Negative for cough, wheezing CARDIOVASCULAR: Negative for chest pain, leg swelling, palpitations, orthopnea GI: SEE HPI MUSCULOSKELETAL: Negative for joint pain or swelling, back pain, and muscle pain. SKIN: Negative for lesions, rash PSYCH: Negative for sleep disturbance, mood disorder and recent psychosocial stressors. HEMATOLOGY Negative for prolonged bleeding, bruising easily, and swollen nodes. ENDOCRINE: Negative for cold or heat intolerance, polyuria, polydipsia and goiter. NEURO: negative for tremor, gait imbalance, syncope and seizures. The remainder of the review of systems is noncontributory.   Physical Exam: BP (!) 157/83 (BP Location: Left Arm, Patient Position: Sitting, Cuff Size: Small)   Pulse 63   Temp (!) 97.5 F (36.4 C) (Temporal)   Ht '5\' 6"'$  (1.676 m)   Wt 149 lb 4.8 oz (67.7 kg)   LMP 10/29/2006 (Approximate)   BMI 24.10 kg/m  GENERAL: The patient is AO x3, in no acute distress. HEENT: Head is normocephalic and atraumatic. EOMI are intact. Mouth is well hydrated and without lesions. NECK: Supple. No masses LUNGS: Clear to auscultation. No presence of rhonchi/wheezing/rales. Adequate chest expansion HEART: RRR, normal s1 and s2. ABDOMEN: Soft, nontender, no guarding, no peritoneal signs, and nondistended. BS +. No masses. EXTREMITIES: Without any cyanosis, clubbing, rash, lesions or edema. NEUROLOGIC: AOx3, no focal motor deficit. SKIN: no jaundice, no rashes   Imaging/Labs: as above  I personally reviewed and interpreted the available labs, imaging and endoscopic files.  Impression and Plan: BITHA FAUTEUX is a 66 y.o. female with past medical history of anxiety, depression, fibromyalgia, GERD, hypothyroidism,  respiratory MAI, who presents for evaluation of throat discomfort, chest and abdominal fullness, abdominal pain and changes in her bowel movements.  The patient has presented with recurrent episodes of thoracic discomfort and questionable dysphagia along with early satiety for the last few months.  Has not presented any other associated red flag signs.  No recent abdominal imaging is available.  Will start her  symptom evaluation with an EGD, but will also look for other reversible etiologies with serologies described below.  As it is possible that part of her symptoms are related to persistent GERD, I advised her to increase the pantoprazole intake to twice a day dosing for now.  Also, the patient has presented new onset diarrhea which started acutely yesterday.  Given the acute onset of her symptoms, this is likely related to an acute infectious process.  She may implement the brat diet for now but if her symptoms persist, I gave her an order to check GI pathogen panel and C. diff testing.  I advised to avoid antidiarrheals for now.  Currently, we will schedule her for a colonoscopy for screening purposes.  If she presents with diarrhea, we may obtain random colonic biopsies to rule out microscopic colitis.  The patient was found to have elevated blood pressure when vital signs were checked in the office. The blood pressure was rechecked by the nursing staff and it was found be persistently elevated >140/90 mmHg. I personally advised to the patient to follow up closely with PCP for hypertension control.  - Schedule EGD and colonoscopy - Increase pantoprazole to 40 mg twice a day -Check CBC, CMP, celiac disease panel and TSH - Can perform C. Diff and GI path panel testing on Friday if persistent diarrhea  All questions were answered.      Melody Peppers, MD Gastroenterology and Hepatology [facility]

## 2022-09-18 ENCOUNTER — Telehealth: Payer: Self-pay | Admitting: *Deleted

## 2022-09-18 NOTE — Telephone Encounter (Signed)
LMOVM to call back to schedule TCS/EGD/+/-ED with propofol, asa 1-2

## 2022-09-19 DIAGNOSIS — R6881 Early satiety: Secondary | ICD-10-CM | POA: Diagnosis not present

## 2022-09-19 DIAGNOSIS — R1030 Lower abdominal pain, unspecified: Secondary | ICD-10-CM | POA: Diagnosis not present

## 2022-09-19 MED ORDER — PEG 3350-KCL-NA BICARB-NACL 420 G PO SOLR: 4000.0000 mL | Freq: Once | ORAL | 0 refills | Status: AC

## 2022-09-19 NOTE — Telephone Encounter (Signed)
PA approved via [facility]. Auth# [id number], DOS: Oct 19, 2022 - Oct 28, 2022

## 2022-09-19 NOTE — Telephone Encounter (Signed)
Spoke with pt. She has been scheduled for 12/22 at 10:30am. Aware will send instructions to her. Rx for prep sent to pharmacy.

## 2022-09-21 ENCOUNTER — Other Ambulatory Visit: Payer: Self-pay | Admitting: Gastroenterology

## 2022-09-21 ENCOUNTER — Other Ambulatory Visit: Payer: Self-pay | Admitting: Internal Medicine

## 2022-09-23 ENCOUNTER — Emergency Department (HOSPITAL_COMMUNITY)
Admission: EM | Admit: 2022-09-23 | Discharge: 2022-09-23 | Disposition: A | Discharge status: Home / Self Care | Payer: Medicare Other | Attending: Emergency Medicine | Admitting: Emergency Medicine

## 2022-09-23 ENCOUNTER — Encounter (HOSPITAL_COMMUNITY): Payer: Self-pay | Admitting: *Deleted

## 2022-09-23 ENCOUNTER — Other Ambulatory Visit: Payer: Self-pay

## 2022-09-23 ENCOUNTER — Emergency Department (HOSPITAL_COMMUNITY): Payer: Medicare Other

## 2022-09-23 DIAGNOSIS — R002 Palpitations: Secondary | ICD-10-CM | POA: Diagnosis not present

## 2022-09-23 DIAGNOSIS — I10 Essential (primary) hypertension: Secondary | ICD-10-CM | POA: Insufficient documentation

## 2022-09-23 DIAGNOSIS — Z79899 Other long term (current) drug therapy: Secondary | ICD-10-CM | POA: Insufficient documentation

## 2022-09-23 DIAGNOSIS — S161XXA Strain of muscle, fascia and tendon at neck level, initial encounter: Secondary | ICD-10-CM | POA: Insufficient documentation

## 2022-09-23 DIAGNOSIS — X58XXXA Exposure to other specified factors, initial encounter: Secondary | ICD-10-CM | POA: Diagnosis not present

## 2022-09-23 DIAGNOSIS — E039 Hypothyroidism, unspecified: Secondary | ICD-10-CM | POA: Insufficient documentation

## 2022-09-23 DIAGNOSIS — S199XXA Unspecified injury of neck, initial encounter: Secondary | ICD-10-CM | POA: Diagnosis present

## 2022-09-23 DIAGNOSIS — R791 Abnormal coagulation profile: Secondary | ICD-10-CM | POA: Insufficient documentation

## 2022-09-23 DIAGNOSIS — R079 Chest pain, unspecified: Secondary | ICD-10-CM | POA: Diagnosis not present

## 2022-09-23 DIAGNOSIS — R400 Somnolence: Secondary | ICD-10-CM | POA: Insufficient documentation

## 2022-09-23 DIAGNOSIS — J439 Emphysema, unspecified: Secondary | ICD-10-CM | POA: Diagnosis not present

## 2022-09-23 HISTORY — DX: Essential (primary) hypertension: I10

## 2022-09-23 LAB — CBC WITH DIFFERENTIAL/PLATELET
Abs Immature Granulocytes: 0.02 10*3/uL (ref 0.00–0.07)
Basophils Absolute: 0 10*3/uL (ref 0.0–0.1)
Basophils Relative: 1 %
Eosinophils Absolute: 0.1 10*3/uL (ref 0.0–0.5)
Eosinophils Relative: 2 %
HCT: 41.9 % (ref 36.0–46.0)
Hemoglobin: 13.9 g/dL (ref 12.0–15.0)
Immature Granulocytes: 0 %
Lymphocytes Relative: 18 %
Lymphs Abs: 1.3 10*3/uL (ref 0.7–4.0)
MCH: 29.3 pg (ref 26.0–34.0)
MCHC: 33.2 g/dL (ref 30.0–36.0)
MCV: 88.4 fL (ref 80.0–100.0)
Monocytes Absolute: 0.4 10*3/uL (ref 0.1–1.0)
Monocytes Relative: 5 %
Neutro Abs: 5.5 10*3/uL (ref 1.7–7.7)
Neutrophils Relative %: 74 %
Platelets: 178 10*3/uL (ref 150–400)
RBC: 4.74 MIL/uL (ref 3.87–5.11)
RDW: 12.8 % (ref 11.5–15.5)
WBC: 7.4 10*3/uL (ref 4.0–10.5)
nRBC: 0 % (ref 0.0–0.2)

## 2022-09-23 LAB — TROPONIN I (HIGH SENSITIVITY)
Troponin I (High Sensitivity): 4 ng/L (ref <18)
Troponin I (High Sensitivity): 6 ng/L (ref <18)

## 2022-09-23 LAB — BASIC METABOLIC PANEL
Anion gap: 5 (ref 5–15)
BUN: 18 mg/dL (ref 8–23)
CO2: 29 mmol/L (ref 22–32)
Calcium: 9.1 mg/dL (ref 8.9–10.3)
Chloride: 106 mmol/L (ref 98–111)
Creatinine, Ser: 0.84 mg/dL (ref 0.44–1.00)
GFR, Estimated: >60 mL/min (ref >60)
Glucose, Bld: 86 mg/dL (ref 70–99)
Potassium: 3.6 mmol/L (ref 3.5–5.1)
Sodium: 140 mmol/L (ref 135–145)

## 2022-09-23 LAB — D-DIMER, QUANTITATIVE: D-Dimer, Quant: 0.53 ug/mL-FEU — ABNORMAL HIGH (ref 0.00–0.50)

## 2022-09-23 MED ORDER — LOSARTAN POTASSIUM 25 MG PO TABS: 25.0000 mg | ORAL_TABLET | Freq: Every day | ORAL | 0 refills | Status: DC

## 2022-09-23 MED ORDER — METHOCARBAMOL 500 MG PO TABS: 500.0000 mg | ORAL_TABLET | Freq: Three times a day (TID) | ORAL | 0 refills | Status: DC

## 2022-09-23 NOTE — ED Provider Notes (Signed)
[facility] EMERGENCY DEPARTMENT Provider Note   CSN: [id number] Arrival date & time: 09/23/22  1312     History  Chief Complaint  Patient presents with   Hypertension    LARYAH NEUSER is a 66 y.o. female.   Hypertension Associated symptoms include headaches and shortness of breath. Pertinent negatives include no chest pain and no abdominal pain.       THEORA VANKIRK is a 66 y.o. female with medical history of hypothyroidism, anxiety, iron deficient anemia, fibromyalgia hypertension and recurrent headache who presents to the Emergency Department with multiple complaints.  She is requesting evaluation for elevated blood pressure waxing and waning for 2 weeks.  She was seen in the beginning of November by PCP she was normotensive at that time.  She was later seen 2 weeks ago at her GI visit and blood pressure was in the 062B systolic.  She checked her blood pressure at home this morning and systolic blood pressure was 178.  She also notes waxing and waning right-sided headache x2 days.  She is having "soreness" along the right upper back and shoulder area.  This pain is worse with movement of her right arm and neck.  Denies any known injury.  Her current symptoms are also associated with fatigue and somnolence.  She endorses some intermittent shortness of breath as well.  Denies any recent illness, fever, chills, cough and chest pain. She recently started oxybutynin and is concerned her symptoms are related to this medication.  Home Medications Prior to Admission medications   Medication Sig Start Date End Date Taking? Authorizing Provider  albuterol (VENTOLIN HFA) 108 (90 Base) MCG/ACT inhaler INHALE TWO PUFFS BY MOUTH EVERY 4 HOURS AS NEEDED FOR wheezing OR SHORTNESS OF BREATH 06/18/22   Plotnikov, Evie Lacks, MD  ALPRAZolam Duanne Moron) 1 MG tablet Take 1 mg by mouth 3 (three) times daily as needed.    [provider]  Cholecalciferol (VITAMIN D3) 1.25 MG (50000 UT) CAPS TAKE ONE  CAPSULE BY MOUTH EVERY 14 DAYS 09/13/22   Plotnikov, Evie Lacks, MD  cyanocobalamin (VITAMIN B12) 1000 MCG/ML injection INJECT 1ML INTO THE SKIN EVERY 14 DAYS 08/22/22   Plotnikov, Evie Lacks, MD  diclofenac Sodium (VOLTAREN) 1 % GEL APPLY TWO GRAMS TO AFFECTED AREA(S) FOUR TIMES DAILY Patient taking differently: APPLY TWO GRAMS TO AFFECTED AREA(S) FOUR TIMES DAILY PRN 02/22/22   Plotnikov, Evie Lacks, MD  fluticasone (FLONASE) 50 MCG/ACT nasal spray INSTILL TWO SPRAYS IN EACH NOSTRIL DAILY 09/20/21   Plotnikov, Evie Lacks, MD  levocetirizine (XYZAL) 5 MG tablet TAKE 1 TABLET BY MOUTH EVERY EVENING (FOR ALLERGY) 09/20/21   Plotnikov, Evie Lacks, MD  levothyroxine (SYNTHROID) 50 MCG tablet Take 1 tablet (50 mcg total) by mouth daily before breakfast. 02/22/22   Plotnikov, Evie Lacks, MD  lidocaine (LIDODERM) 5 % Place 1-2 patches onto the skin daily. Remove & Discard patch within 12 hours or as directed by MD 05/28/22   Plotnikov, Evie Lacks, MD  methylphenidate (RITALIN) 20 MG tablet Take 1 tablet (20 mg total) by mouth daily. 11/10/18   Johnson, Clanford L, MD  oxybutynin (DITROPAN XL) 10 MG 24 hr tablet Take 1 tablet (10 mg total) by mouth at bedtime. 09/10/22   Nunzio Cobbs, MD  pantoprazole (PROTONIX) 40 MG tablet Take 1 tablet (40 mg total) by mouth 2 (two) times daily. Patient taking differently: Take 40 mg by mouth daily. 02/22/22   Plotnikov, Evie Lacks, MD  promethazine (PHENERGAN) 25 MG  tablet TAKE 1/2 TO 1 TABLET BY MOUTH EVERY 8 HOURS AS NEEDED FOR NAUSEA AND VOMITING 05/25/22   Plotnikov, Evie Lacks, MD  QUEtiapine (SEROQUEL) 25 MG tablet Take 25 mg by mouth at bedtime.    [provider]  rizatriptan (MAXALT) 10 MG tablet Take 1 tablet (10 mg total) by mouth once as needed for up to 1 dose for migraine. May repeat in 2 hours if needed 06/13/21   Plotnikov, Evie Lacks, MD  triamcinolone ointment (KENALOG) 0.1 % Apply topically 2 (two) times daily as needed. to affected area(s)  02/22/22   Plotnikov, Evie Lacks, MD      Allergies    Flagyl [metronidazole], Doxycycline hyclate, Dulera [mometasone furo-formoterol fum], Gabapentin, Nabumetone, Sulfa antibiotics, Tetracycline hcl, and Tramadol hcl    Review of Systems   Review of Systems  Constitutional:  Negative for appetite change, chills and fever.  Eyes:  Negative for photophobia, pain and visual disturbance.  Respiratory:  Positive for shortness of breath. Negative for chest tightness.   Cardiovascular:  Negative for chest pain.  Gastrointestinal:  Negative for abdominal pain, nausea and vomiting.  Genitourinary:  Negative for dysuria and flank pain.  Musculoskeletal:  Positive for back pain (right upper back pain).  Skin:  Negative for rash.  Neurological:  Positive for headaches. Negative for seizures, syncope, weakness and numbness.  Psychiatric/Behavioral:  Negative for confusion.     Physical Exam Updated Vital Signs BP 137/77   Pulse 69   Temp 98.2 F (36.8 C) (Oral)   Resp 20   LMP 10/29/2006 (Approximate)   SpO2 97%  Physical Exam Vitals and nursing note reviewed.  Constitutional:      General: She is not in acute distress.    Appearance: Normal appearance. She is not toxic-appearing.  HENT:     Right Ear: Tympanic membrane and ear canal normal.     Left Ear: Tympanic membrane and ear canal normal.  Eyes:     Extraocular Movements: Extraocular movements intact.     Conjunctiva/sclera: Conjunctivae normal.     Pupils: Pupils are equal, round, and reactive to Knecht.  Neck:     Meningeal: Kernig's sign absent.  Cardiovascular:     Rate and Rhythm: Normal rate and regular rhythm.     Pulses: Normal pulses.  Pulmonary:     Effort: Pulmonary effort is normal. No respiratory distress.  Chest:     Chest wall: No tenderness.  Abdominal:     Palpations: Abdomen is soft.     Tenderness: There is no abdominal tenderness.  Musculoskeletal:        General: Tenderness present.     Cervical  back: Normal range of motion. No rigidity or torticollis. Muscular tenderness present. No spinous process tenderness. Normal range of motion.     Right lower leg: No edema.     Left lower leg: No edema.     Comments: Ttp of the right trapiezus and right upper back.  She has FROM of the right arm and neck  Lymphadenopathy:     Cervical: No cervical adenopathy.  Skin:    General: Skin is warm.     Capillary Refill: Capillary refill takes less than 2 seconds.     Findings: No rash.  Neurological:     General: No focal deficit present.     Mental Status: She is alert.     GCS: GCS eye subscore is 4. GCS verbal subscore is 5. GCS motor subscore is 6.  Sensory: Sensation is intact. No sensory deficit.     Motor: Motor function is intact. No weakness.     Coordination: Coordination is intact.     Comments: CN II through XII intact.  Speech clear.  No facial droop.  No pronator drift.     ED Results / Procedures / Treatments   Labs (all labs ordered are listed, but only abnormal results are displayed) Labs Reviewed  D-DIMER, QUANTITATIVE - Abnormal; Notable for the following components:      Result Value   D-Dimer, Quant 0.53 (*)    All other components within normal limits  CBC WITH DIFFERENTIAL/PLATELET  BASIC METABOLIC PANEL  TROPONIN I (HIGH SENSITIVITY)  TROPONIN I (HIGH SENSITIVITY)    EKG None  Radiology DG Chest 2 View  Result Date: 09/23/2022 CLINICAL DATA:  Chest and back pain. EXAM: CHEST - 2 VIEW COMPARISON:  Chest x-ray 01/18/2021 and chest CT 04/03/2019 FINDINGS: The cardiac silhouette, mediastinal and hilar contours are within normal limits. Stable emphysematous changes and hyperinflation. Fairly extensive nodular opacities in both lungs progressive since the prior chest x-ray. Findings likely due to chronic inflammatory process or atypical infection/MAC. These findings were also present on the prior chest CT from 2020. No focal airspace consolidation or pleural  effusion. IMPRESSION: 1. Chronic lung disease with progressive nodular opacities since the prior chest x-ray. This is likely chronic inflammation or atypical infection such as MAC. 2. No definite infiltrates or effusions. Electronically Signed   By: Marijo Sanes M.D.   On: 09/23/2022 14:05    Procedures Procedures    Medications Ordered in ED Medications - No data to display  ED Course/ Medical Decision Making/ A&P                           Medical Decision Making Patient here for evaluation of elevated blood pressure has been waxing and waning x2 weeks.  Blood pressure 178 at home today.  She was normotensive beginning of November when seen by her PCP.  Notes right-sided headache waxing and waning in severity x2 days.  Headache has been associated with pain of her right shoulder and right upper back that is reproducible with palpation and movement.  Denies any visual changes, also reports increased somnolence and generalized weakness.  Denies any chest pain.  On exam, patient well-appearing nontoxic.  No focal neurodeficits on my exam, initial blood pressure 137/77, repeat vitals blood pressure now 183/89.  No tachycardia tachypnea or hypoxia.  Clinically, my suspicion for PE is low but considered as part of differential.  Also viral process considered.  No concerning symptoms for CVA or TIA.  Headache currently reported as 2 out of 10.  Amount and/or Complexity of Data Reviewed Labs: ordered.    Details: Labs interpreted by me, no evidence of leukocytosis, hemoglobin reassuring, chemistries without derangement.  Initial troponin of 4.  D-dimer mildly elevated at 0.53,  Radiology: ordered.    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    Details: EKG shows normal sinus rhythm, poor R wave progression.  Read by Dr. Hazle Nordmann. Discussion of management or test interpretation with external provider(s): Age-adjusted rule for VTE utilized, at 0.53, PE felt less likely. Right sided neck pain reproducible with palpation and movement, felt MSK.  No clinical concern or meningitis.  No focal neuro deficits  on her exam.  Repeat vital signs shows some elevation of her blood pressure.  Labs without evidence of endorgan damage.  Feel it is reasonable to start her on antihypertensive medication.  Will provided referral information for cardiology regarding her palpitations.  She may need Holter monitoring.  She is agreeable to this plan.Marland Kitchen  Appropriate for discharge home, all questions were answered.  Return precautions discussed.           Final Clinical Impression(s) / ED Diagnoses Final diagnoses:  Hypertension, unspecified type  Palpitations  Neck muscle strain, initial encounter    Rx / DC Orders ED Discharge Orders     None         Bufford Lope 09/23/22 2317    Noemi Chapel, MD 09/23/22 2336

## 2022-09-23 NOTE — ED Triage Notes (Signed)
Pt with c/o HTN.  + HA, denies any CP.  Upper right back pain that radiates down right arm.  Trouble with swallowing for weeks.  Palpitations at times.  + lightheadedness yesterday and today C/o thrush

## 2022-09-23 NOTE — Discharge Instructions (Signed)
You may alternate ice and heat to your neck.  Take the muscle relaxer as directed.  You have been started on a low-dose medication for your blood pressure.  Take this medication daily.  Also, I recommend that you keep a blood pressure diary.  Check your blood pressure once a day at the same time each day and record this.  Take this with you you follow-up with the cardiologist or your primary care provider.  1 from the cardiology office should contact you this week to arrange follow-up appointment.

## 2022-09-24 LAB — CBC WITH DIFFERENTIAL/PLATELET
Absolute Monocytes: 346 cells/uL (ref 200–950)
Basophils Absolute: 38 cells/uL (ref 0–200)
Basophils Relative: 0.7 %
Eosinophils Absolute: 92 cells/uL (ref 15–500)
Eosinophils Relative: 1.7 %
HCT: 40.2 % (ref 35.0–45.0)
Hemoglobin: 13.6 g/dL (ref 11.7–15.5)
Lymphs Abs: 1409 cells/uL (ref 850–3900)
MCH: 29.4 pg (ref 27.0–33.0)
MCHC: 33.8 g/dL (ref 32.0–36.0)
MCV: 86.8 fL (ref 80.0–100.0)
MPV: 11.6 fL (ref 7.5–12.5)
Monocytes Relative: 6.4 %
Neutro Abs: 3515 cells/uL (ref 1500–7800)
Neutrophils Relative %: 65.1 %
Platelets: 188 10*3/uL (ref 140–400)
RBC: 4.63 10*6/uL (ref 3.80–5.10)
RDW: 12.8 % (ref 11.0–15.0)
Total Lymphocyte: 26.1 %
WBC: 5.4 10*3/uL (ref 3.8–10.8)

## 2022-09-24 LAB — TSH: TSH: 4.73 mIU/L — ABNORMAL HIGH (ref 0.40–4.50)

## 2022-09-24 LAB — COMPREHENSIVE METABOLIC PANEL
AG Ratio: 1.6 (calc) (ref 1.0–2.5)
ALT: 20 U/L (ref 6–29)
AST: 19 U/L (ref 10–35)
Albumin: 4.1 g/dL (ref 3.6–5.1)
Alkaline phosphatase (APISO): 74 U/L (ref 37–153)
BUN: 17 mg/dL (ref 7–25)
CO2: 29 mmol/L (ref 20–32)
Calcium: 9.6 mg/dL (ref 8.6–10.4)
Chloride: 106 mmol/L (ref 98–110)
Creat: 0.91 mg/dL (ref 0.50–1.05)
Globulin: 2.5 g/dL (calc) (ref 1.9–3.7)
Glucose, Bld: 78 mg/dL (ref 65–99)
Potassium: 4 mmol/L (ref 3.5–5.3)
Sodium: 142 mmol/L (ref 135–146)
Total Bilirubin: 0.8 mg/dL (ref 0.2–1.2)
Total Protein: 6.6 g/dL (ref 6.1–8.1)

## 2022-09-24 LAB — CELIAC DISEASE PANEL
(tTG) Ab, IgA: <1 U/mL
(tTG) Ab, IgG: <1 U/mL
Gliadin IgA: <1 U/mL
Gliadin IgG: <1 U/mL
Immunoglobulin A: 108 mg/dL (ref 70–320)

## 2022-09-25 ENCOUNTER — Telehealth: Payer: Self-pay | Admitting: Internal Medicine

## 2022-09-25 NOTE — Telephone Encounter (Signed)
Patient called and said that her blood was high over the weekend - Sunday - 179/88.  Patient was seen at the ER.  ER told patient that she needed to watch her sodium and to let her primary care know.

## 2022-09-26 NOTE — Telephone Encounter (Signed)
Pt states she wants to know if she is needing to take the BP meds that was rx'd to her by the provider at the ED? Pt states she has an endoscopy and colonoscopy coming in and is wanting to know if she needs to stop taking meds?  Pt states her BP has in the 120's or lower and bottom number been in the 70's or 80's and was not taking the BP meds. She has not taken the meds prior from today. She states she took her 1st dose today at 1pm.  **Pt wants to know what to do?

## 2022-09-26 NOTE — Telephone Encounter (Signed)
Called pt no answer LMOM MD response.Marland KitchenJohny [name]

## 2022-09-26 NOTE — Telephone Encounter (Signed)
Called pt no answer LMOM yes she need to take her Losartan 25 mg everyday for BP. When she is schedule for her endoscopy/colonoscopy they will let you know what to take before procedure../l,mb

## 2022-09-26 NOTE — Telephone Encounter (Signed)
Noted Keep ROV pls Thx

## 2022-09-28 NOTE — Telephone Encounter (Signed)
Yes please take losartan every day.  Thank you

## 2022-10-01 NOTE — Telephone Encounter (Signed)
Spoke with the pt and was able to inform her of providers instructions.

## 2022-10-08 NOTE — Telephone Encounter (Signed)
Status of referral?

## 2022-10-09 NOTE — Telephone Encounter (Signed)
Per review of EPIC, referral closed, patient seen by GI on 09/17/22.    Routing to provider FYI.   Encounter closed.

## 2022-10-16 ENCOUNTER — Telehealth: Payer: Self-pay | Admitting: Internal Medicine

## 2022-10-16 MED ORDER — METHOCARBAMOL 500 MG PO TABS: 500.0000 mg | ORAL_TABLET | Freq: Three times a day (TID) | ORAL | 0 refills | Status: DC | PRN

## 2022-10-16 MED ORDER — LOSARTAN POTASSIUM 25 MG PO TABS: 25.0000 mg | ORAL_TABLET | Freq: Every day | ORAL | 2 refills | Status: DC

## 2022-10-16 NOTE — Telephone Encounter (Signed)
Refill protocol 24-78 hours sent to pof.Marland KitchenJohny [name]

## 2022-10-16 NOTE — Telephone Encounter (Signed)
Patient called and needs a refill on her losartan (cozaar) '25mg'$ , she has 9 pills left, and her methocarbamol (robaxin) '500mg'$ . Patient can be contacted at [phone].

## 2022-10-17 ENCOUNTER — Telehealth: Payer: Self-pay | Admitting: *Deleted

## 2022-10-17 NOTE — Telephone Encounter (Signed)
Patient scheduled on 10/31/22

## 2022-10-17 NOTE — Telephone Encounter (Signed)
Patient called in triage voicemail asking if she should continue taking oxybutynin 10 mg tablet, per note on 09/10/22 " If UC is negative, then start Ditropan XL 10 mg daily and follow up 7 weeks from now."  Urine culture was negative. I relayed the above to patient and explained she needs follow up in 7 weeks which would be Jan. Patient was asleep and said she would call back to schedule for Jan.

## 2022-10-18 ENCOUNTER — Telehealth: Payer: Self-pay | Admitting: *Deleted

## 2022-10-18 NOTE — Telephone Encounter (Signed)
error 

## 2022-10-19 ENCOUNTER — Ambulatory Visit (HOSPITAL_COMMUNITY): Payer: Medicare Other | Admitting: Certified Registered"

## 2022-10-19 ENCOUNTER — Encounter (HOSPITAL_COMMUNITY)
Admission: RE | Disposition: A | Discharge status: Home / Self Care | Payer: Self-pay | Source: Ambulatory Visit | Attending: Gastroenterology

## 2022-10-19 ENCOUNTER — Encounter (HOSPITAL_COMMUNITY): Payer: Self-pay | Admitting: Gastroenterology

## 2022-10-19 ENCOUNTER — Ambulatory Visit (HOSPITAL_COMMUNITY)
Admission: RE | Admit: 2022-10-19 | Discharge: 2022-10-19 | Disposition: A | Discharge status: Home / Self Care | Payer: Medicare Other | Source: Ambulatory Visit | Attending: Gastroenterology | Admitting: Gastroenterology

## 2022-10-19 ENCOUNTER — Other Ambulatory Visit: Payer: Self-pay

## 2022-10-19 ENCOUNTER — Ambulatory Visit (HOSPITAL_BASED_OUTPATIENT_CLINIC_OR_DEPARTMENT_OTHER): Payer: Medicare Other | Admitting: Certified Registered"

## 2022-10-19 DIAGNOSIS — R1310 Dysphagia, unspecified: Secondary | ICD-10-CM | POA: Insufficient documentation

## 2022-10-19 DIAGNOSIS — K219 Gastro-esophageal reflux disease without esophagitis: Secondary | ICD-10-CM | POA: Diagnosis not present

## 2022-10-19 DIAGNOSIS — R109 Unspecified abdominal pain: Secondary | ICD-10-CM | POA: Insufficient documentation

## 2022-10-19 DIAGNOSIS — I10 Essential (primary) hypertension: Secondary | ICD-10-CM | POA: Insufficient documentation

## 2022-10-19 DIAGNOSIS — K319 Disease of stomach and duodenum, unspecified: Secondary | ICD-10-CM | POA: Insufficient documentation

## 2022-10-19 DIAGNOSIS — F319 Bipolar disorder, unspecified: Secondary | ICD-10-CM | POA: Diagnosis not present

## 2022-10-19 DIAGNOSIS — D123 Benign neoplasm of transverse colon: Secondary | ICD-10-CM | POA: Diagnosis not present

## 2022-10-19 DIAGNOSIS — K648 Other hemorrhoids: Secondary | ICD-10-CM

## 2022-10-19 DIAGNOSIS — K449 Diaphragmatic hernia without obstruction or gangrene: Secondary | ICD-10-CM | POA: Insufficient documentation

## 2022-10-19 DIAGNOSIS — E039 Hypothyroidism, unspecified: Secondary | ICD-10-CM | POA: Insufficient documentation

## 2022-10-19 DIAGNOSIS — D649 Anemia, unspecified: Secondary | ICD-10-CM | POA: Insufficient documentation

## 2022-10-19 DIAGNOSIS — Z87891 Personal history of nicotine dependence: Secondary | ICD-10-CM | POA: Diagnosis not present

## 2022-10-19 DIAGNOSIS — K5730 Diverticulosis of large intestine without perforation or abscess without bleeding: Secondary | ICD-10-CM | POA: Insufficient documentation

## 2022-10-19 DIAGNOSIS — R197 Diarrhea, unspecified: Secondary | ICD-10-CM | POA: Insufficient documentation

## 2022-10-19 DIAGNOSIS — K529 Noninfective gastroenteritis and colitis, unspecified: Secondary | ICD-10-CM

## 2022-10-19 DIAGNOSIS — K6389 Other specified diseases of intestine: Secondary | ICD-10-CM | POA: Diagnosis not present

## 2022-10-19 DIAGNOSIS — R519 Headache, unspecified: Secondary | ICD-10-CM | POA: Insufficient documentation

## 2022-10-19 DIAGNOSIS — R6881 Early satiety: Secondary | ICD-10-CM | POA: Diagnosis not present

## 2022-10-19 DIAGNOSIS — M797 Fibromyalgia: Secondary | ICD-10-CM | POA: Insufficient documentation

## 2022-10-19 DIAGNOSIS — J449 Chronic obstructive pulmonary disease, unspecified: Secondary | ICD-10-CM | POA: Diagnosis not present

## 2022-10-19 DIAGNOSIS — K635 Polyp of colon: Secondary | ICD-10-CM | POA: Diagnosis not present

## 2022-10-19 DIAGNOSIS — F419 Anxiety disorder, unspecified: Secondary | ICD-10-CM | POA: Diagnosis not present

## 2022-10-19 HISTORY — PX: SAVORY DILATION: SHX5439

## 2022-10-19 HISTORY — PX: BIOPSY: SHX5522

## 2022-10-19 HISTORY — PX: ESOPHAGEAL DILATION: SHX303

## 2022-10-19 HISTORY — PX: COLONOSCOPY WITH PROPOFOL: SHX5780

## 2022-10-19 HISTORY — PX: POLYPECTOMY: SHX149

## 2022-10-19 HISTORY — PX: ESOPHAGOGASTRODUODENOSCOPY (EGD) WITH PROPOFOL: SHX5813

## 2022-10-19 LAB — HM COLONOSCOPY

## 2022-10-19 SURGERY — COLONOSCOPY WITH PROPOFOL
Anesthesia: General

## 2022-10-19 MED ORDER — PROPOFOL 10 MG/ML IV BOLUS
INTRAVENOUS | Status: DC | PRN
  Administered 2022-10-19: 100 mg via INTRAVENOUS

## 2022-10-19 MED ORDER — LACTATED RINGERS IV SOLN: INTRAVENOUS | Status: DC | PRN

## 2022-10-19 MED ORDER — LIDOCAINE HCL (CARDIAC) PF 100 MG/5ML IV SOSY
PREFILLED_SYRINGE | INTRAVENOUS | Status: DC | PRN
  Administered 2022-10-19: 50 mg via INTRATRACHEAL

## 2022-10-19 MED ORDER — PROPOFOL 500 MG/50ML IV EMUL
INTRAVENOUS | Status: DC | PRN
  Administered 2022-10-19: 200 ug/kg/min via INTRAVENOUS

## 2022-10-19 NOTE — Op Note (Signed)
[facility] Patient Name: Melody [name] Procedure Date: 10/19/2022 9:57 AM MRN: [id number] Date of Birth: 1956-09-01 Attending MD: Maylon Peppers , , [id number] CSN: [id number] Age: 66 Admit Type: Outpatient Procedure:                Colonoscopy Indications:              Clinically significant diarrhea of unexplained                            origin Providers:                Maylon Peppers, Crystal Page, Raphael Gibney,                            Technician Referring MD:              Medicines:                Monitored Anesthesia Care Complications:            No immediate complications. Estimated Blood Loss:     Estimated blood loss: none. Procedure:                Pre-Anesthesia Assessment:                           - Prior to the procedure, a History and Physical                            was performed, and patient medications, allergies                            and sensitivities were reviewed. The patient's                            tolerance of previous anesthesia was reviewed.                           - The risks and benefits of the procedure and the                            sedation options and risks were discussed with the                            patient. All questions were answered and informed                            consent was obtained.                           - ASA Grade Assessment: II - A patient with mild                            systemic disease.                           After obtaining informed consent, the colonoscope  was passed under direct vision. Throughout the                            procedure, the patient's blood pressure, pulse, and                            oxygen saturations were monitored continuously. The                            [id number]-HQ190L ([id number]) was introduced through the                            anus and advanced to the the cecum, identified by                            appendiceal  orifice and ileocecal valve. The                            colonoscopy was performed without difficulty. The                            patient tolerated the procedure well. The quality                            of the bowel preparation was excellent. Scope In: 10:31:04 AM Scope Out: 10:51:44 AM Scope Withdrawal Time: 0 hours 13 minutes 36 seconds  Total Procedure Duration: 0 hours 20 minutes 40 seconds  Findings:      The perianal and digital rectal examinations were normal.      A 2 mm polyp was found in the transverse colon. The polyp was sessile.       The polyp was removed with a cold snare. Resection and retrieval were       complete.      Multiple large-mouthed and small-mouthed diverticula were found in the       sigmoid colon, descending colon, transverse colon and ascending colon.       Biopsies for histology were taken from normal colon with a cold forceps       from the right colon and left colon for evaluation of microscopic       colitis.      Non-bleeding internal hemorrhoids were found during retroflexion. The       hemorrhoids were small. Impression:               - One 2 mm polyp in the transverse colon, removed                            with a cold snare. Resected and retrieved.                           - Diverticulosis in the sigmoid colon, in the                            descending colon, in the transverse colon and in  the ascending colon. normal colon biopsied.                           - Non-bleeding internal hemorrhoids. Moderate Sedation:      Per Anesthesia Care Recommendation:           - Discharge patient to home (ambulatory).                           - Resume previous diet.                           - Await pathology results.                           - Repeat colonoscopy for surveillance based on                            pathology results.                           - Take Imodium as needed if more than 4 bowel                             movements per day or if presenting fecal urgency Procedure Code(s):        --- Professional ---                           [phone], Colonoscopy, flexible; with removal of                            tumor(s), polyp(s), or other lesion(s) by snare                            technique                           45380, 72, Colonoscopy, flexible; with biopsy,                            single or multiple Diagnosis Code(s):        --- Professional ---                           D12.3, Benign neoplasm of transverse colon (hepatic                            flexure or splenic flexure)                           K64.8, Other hemorrhoids                           R19.7, Diarrhea, unspecified                           K57.30, Diverticulosis of large intestine without  perforation or abscess without bleeding CPT copyright 2022 American [employer] Association. All rights reserved. The codes documented in this report are preliminary and upon coder review may  be revised to meet current compliance requirements. Maylon Peppers, MD Maylon Peppers,  10/19/2022 10:59:52 AM This report has been signed electronically. Number of Addenda: 0

## 2022-10-19 NOTE — Op Note (Signed)
[facility] Patient Name: Melody [name] Procedure Date: 10/19/2022 9:58 AM MRN: [id number] Date of Birth: July 06, 1956 Attending MD: Maylon Peppers , , [id number] CSN: [id number] Age: 66 Admit Type: Outpatient Procedure:                Upper GI endoscopy Indications:              Abdominal pain, Dysphagia Providers:                Maylon Peppers, Crystal Page, Raphael Gibney,                            Technician Referring MD:              Medicines:                Monitored Anesthesia Care Complications:            No immediate complications. Estimated Blood Loss:     Estimated blood loss: none. Procedure:                Pre-Anesthesia Assessment:                           - Prior to the procedure, a History and Physical                            was performed, and patient medications, allergies                            and sensitivities were reviewed. The patient's                            tolerance of previous anesthesia was reviewed.                           - The risks and benefits of the procedure and the                            sedation options and risks were discussed with the                            patient. All questions were answered and informed                            consent was obtained.                           - ASA Grade Assessment: II - A patient with mild                            systemic disease.                           After obtaining informed consent, the endoscope was                            passed under direct vision. Throughout the  procedure, the patient's blood pressure, pulse, and                            oxygen saturations were monitored continuously. The                            [id number]-[id number] ([id number]) scope was introduced through the                            mouth, and advanced to the second part of duodenum.                            The upper GI endoscopy was accomplished without                             difficulty. The patient tolerated the procedure                            well. Scope In: 10:15:40 AM Scope Out: 10:23:59 AM Total Procedure Duration: 0 hours 8 minutes 19 seconds  Findings:      No endoscopic abnormality was evident in the esophagus to explain the       patient's complaint of dysphagia. It was decided, however, to proceed       with dilation of the entire esophagus. A guidewire was placed and the       scope was withdrawn. Dilation was performed with a Savary dilator with       no resistance at 18 mm. The dilation site was examined following       endoscope reinsertion and showed no change.      A 2 cm hiatal hernia was present.      The gastroesophageal flap valve was visualized endoscopically and       classified as Hill Grade III (minimal fold, loose to endoscope, hiatal       hernia likely).      The entire examined stomach was normal. Biopsies were taken with a cold       forceps for Helicobacter pylori testing.      The examined duodenum was normal. Biopsies were taken with a cold       forceps for histology. Impression:               - No endoscopic esophageal abnormality to explain                            patient's dysphagia. Esophagus dilated. Dilated.                           - 2 cm hiatal hernia.                           - Normal stomach. Biopsied.                           - Normal examined duodenum. Biopsied. Moderate Sedation:      Per Anesthesia Care Recommendation:           - Discharge patient to home (ambulatory).                           -  Resume previous diet.                           - Await pathology results.                           - Continue present medications. Procedure Code(s):        --- Professional ---                           [phone], Esophagogastroduodenoscopy, flexible,                            transoral; with insertion of guide wire followed by                            passage of dilator(s) through  esophagus over guide                            wire                           43239, 87, Esophagogastroduodenoscopy, flexible,                            transoral; with biopsy, single or multiple Diagnosis Code(s):        --- Professional ---                           R13.10, Dysphagia, unspecified                           K44.9, Diaphragmatic hernia without obstruction or                            gangrene                           R10.9, Unspecified abdominal pain CPT copyright 2022 American Medical Association. All rights reserved. The codes documented in this report are preliminary and upon coder review may  be revised to meet current compliance requirements. Maylon Peppers, MD Maylon Peppers,  10/19/2022 10:54:51 AM This report has been signed electronically. Number of Addenda: 0

## 2022-10-19 NOTE — H&P (Signed)
Melody [name] is an 66 y.o. female.   Chief Complaint: abdominal pain, dysphagia, diarrhea, change in bowel movements. HPI: Melody [name] is a 66 y.o. female with past medical history of anxiety, depression, fibromyalgia, GERD, hypothyroidism, respiratory MAI, who presents for evaluation of throat discomfort, chest and abdominal fullness, abdominal pain, dysphagia and changes in her bowel movements.    Patient reports presented recurrent episodes of diarrhea intermittently every month and dysphagia.  She states that her abdominal pain has resolved.  Denies any fever or chills.  Past Medical History:  Diagnosis Date   Anxiety    Bronchiectasis (HCC)    Chronic back pain    Chronic neck pain    Depression    bipolar- Dr [name]   Fatigue    Fibromyalgia    GERD (gastroesophageal reflux disease)    Headache    Hypertension    Hypothyroidism    Low back pain    Dr Arnoldo Morale   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[facility])    Osteoarthritis    Pneumonia    Pulmonary nodule    Sinus congestion    Thyroid disease    hypothyroidsm   Vertigo    Vitamin B12 deficiency     Past Surgical History:  Procedure Laterality Date   ABDOMINAL HYSTERECTOMY N/A 01/10/2016   Procedure: HYSTERECTOMY ABDOMINAL;  Surgeon: Nunzio Cobbs, MD;  Location: [facility];  Service: Gynecology;  Laterality: N/A;   ABDOMINAL SACROCOLPOPEXY N/A 01/10/2016   Procedure: ABDOMINO SACROCOLPOPEXY ;  Surgeon: Nunzio Cobbs, MD;  Location: [facility];  Service: Gynecology;  Laterality: N/A;   ANTERIOR AND POSTERIOR REPAIR N/A 01/10/2016   Procedure:  POSTERIOR REPAIR (RECTOCELE);  Surgeon: Nunzio Cobbs, MD;  Location: [facility];  Service: Gynecology;  Laterality: N/A;   BLADDER SUSPENSION N/A 01/10/2016   Procedure: TRANSVAGINAL TAPE (TVT) PROCEDURE exact midurethral sling;  Surgeon: Nunzio Cobbs, MD;  Location: [facility];  Service: Gynecology;  Laterality: N/A;   CERVICAL LAMINECTOMY  2001  and 2995   Dr Harmon Pier N/A 01/10/2016   Procedure: CYSTOSCOPY;  Surgeon: Nunzio Cobbs, MD;  Location: [facility];  Service: Gynecology;  Laterality: N/A;   SALPINGOOPHORECTOMY Bilateral 01/10/2016   Procedure: BILATERAL SALPINGO OOPHORECTOMY;  Surgeon: Nunzio Cobbs, MD;  Location: [facility];  Service: Gynecology;  Laterality: Bilateral;   TUBAL LIGATION  1980    Family History  Problem Relation Age of Onset   Heart attack Father        dec heart disease age 42   Hypertension Father    Lung disease Father    Cancer Mother        dec stomach ca--age 57   Breast cancer Cousin    Arthritis Other    Breast cancer Paternal Aunt    Social History:  reports that she quit smoking about 11 years ago. Her smoking use included cigarettes. She started smoking about 59 years ago. She has a 35.00 pack-year smoking history. She has never used smokeless tobacco. She reports that she does not drink alcohol and does not use drugs.  Allergies:  Allergies  Allergen Reactions   Flagyl [Metronidazole] Shortness Of Breath    Made tongue turn dark red, was not able to breath.   Doxycycline Hyclate Itching   Dulera [Mometasone Furo-Formoterol Fum]     thrush   Gabapentin     Headaches: Patient is not aware of the  reaction to this medication   Nabumetone Other (See Comments)    Headache   Sulfa Antibiotics Other (See Comments)    Reaction is unknown   Tetracycline Hcl Itching   Tramadol Hcl Other (See Comments)    Reaction is unknown    Medications Prior to Admission  Medication Sig Dispense Refill   albuterol (VENTOLIN HFA) 108 (90 Base) MCG/ACT inhaler INHALE TWO PUFFS BY MOUTH EVERY 4 HOURS AS NEEDED FOR wheezing OR SHORTNESS OF BREATH 8.5 g 5   ALPRAZolam (XANAX) 1 MG tablet Take 1 mg by mouth 3 (three) times daily.     Cholecalciferol (VITAMIN D3) 1.25 MG (50000 UT) CAPS TAKE ONE CAPSULE BY MOUTH EVERY 14 DAYS 6 capsule 3   cyanocobalamin (VITAMIN B12) 1000 MCG/ML  injection INJECT 1ML INTO THE SKIN EVERY 14 DAYS 6 mL 3   diclofenac Sodium (VOLTAREN) 1 % GEL APPLY TWO GRAMS TO AFFECTED AREA(S) FOUR TIMES DAILY (Patient taking differently: Apply 2 g topically 4 (four) times daily as needed (shoulder pain).) 200 g 3   fluticasone (FLONASE) 50 MCG/ACT nasal spray INSTILL TWO SPRAYS IN EACH NOSTRIL DAILY 48 g 3   levocetirizine (XYZAL) 5 MG tablet TAKE 1 TABLET BY MOUTH EVERY EVENING (FOR ALLERGY) (Patient taking differently: Take 5 mg by mouth daily as needed for allergies.) 90 tablet 3   levothyroxine (SYNTHROID) 50 MCG tablet Take 1 tablet (50 mcg total) by mouth daily before breakfast. 90 tablet 3   lidocaine (LIDODERM) 5 % Place 1-2 patches onto the skin daily. Remove & Discard patch within 12 hours or as directed by MD (Patient taking differently: Place 1-2 patches onto the skin daily as needed (pain). Remove & Discard patch within 12 hours or as directed by MD) 60 patch 3   losartan (COZAAR) 25 MG tablet Take 1 tablet (25 mg total) by mouth daily. Follow-up appt due in Feb must see provider for future refills 30 tablet 2   methocarbamol (ROBAXIN) 500 MG tablet Take 1 tablet (500 mg total) by mouth 3 (three) times daily as needed for muscle spasms. 30 tablet 0   methylphenidate (RITALIN) 20 MG tablet Take 1 tablet (20 mg total) by mouth daily.  0   oxybutynin (DITROPAN XL) 10 MG 24 hr tablet Take 1 tablet (10 mg total) by mouth at bedtime. 30 tablet 1   pantoprazole (PROTONIX) 40 MG tablet TAKE 1 TABLET BY MOUTH TWICE DAILY 180 tablet 3   promethazine (PHENERGAN) 25 MG tablet TAKE 1/2 TO 1 TABLET BY MOUTH EVERY 8 HOURS AS NEEDED FOR NAUSEA AND VOMITING 20 tablet 1   QUEtiapine (SEROQUEL) 25 MG tablet Take 25 mg by mouth at bedtime.     Tiotropium Bromide Monohydrate (SPIRIVA RESPIMAT) 2.5 MCG/ACT AERS Inhale 1 each into the lungs daily.     rizatriptan (MAXALT) 10 MG tablet Take 1 tablet (10 mg total) by mouth once as needed for up to 1 dose for migraine. May  repeat in 2 hours if needed (Patient not taking: Reported on 10/16/2022) 12 tablet 5   triamcinolone ointment (KENALOG) 0.1 % Apply topically 2 (two) times daily as needed. to affected area(s) (Patient not taking: Reported on 10/16/2022) 80 g 3    No results found for this or any previous visit (from the past 48 hour(s)). No results found.  Review of Systems  Constitutional: Negative.   HENT:  Positive for trouble swallowing.   Eyes: Negative.   Respiratory: Negative.    Cardiovascular: Negative.   Gastrointestinal:  Positive for abdominal distention and diarrhea.  Endocrine: Negative.   Genitourinary: Negative.   Musculoskeletal: Negative.   Skin: Negative.   Allergic/Immunologic: Negative.   Neurological: Negative.   Hematological: Negative.   Psychiatric/Behavioral: Negative.      Blood pressure 138/77, temperature 97.9 F (36.6 C), temperature source Oral, resp. rate 20, height '5\' 6"'$  (1.676 m), weight 65.8 kg, last menstrual period 10/29/2006, SpO2 97 %. Physical Exam  GENERAL: The patient is AO x3, in no acute distress. HEENT: Head is normocephalic and atraumatic. EOMI are intact. Mouth is well hydrated and without lesions. NECK: Supple. No masses LUNGS: Clear to auscultation. No presence of rhonchi/wheezing/rales. Adequate chest expansion HEART: RRR, normal s1 and s2. ABDOMEN: Soft, nontender, no guarding, no peritoneal signs, and nondistended. BS +. No masses. EXTREMITIES: Without any cyanosis, clubbing, rash, lesions or edema. NEUROLOGIC: AOx3, no focal motor deficit. SKIN: no jaundice, no rashes  Assessment/Plan Melody [name] is a 66 y.o. female with past medical history of anxiety, depression, fibromyalgia, GERD, hypothyroidism, respiratory MAI, who presents for evaluation of throat discomfort, chest and abdominal fullness, abdominal pain, dysphagia and changes in her bowel movements.  Will proceed with EGD and colonoscopy  Harvel Quale, MD 10/19/2022,  10:10 AM

## 2022-10-19 NOTE — Progress Notes (Signed)
Dr. Briant Cedar in to talk to patient. Patient advised eye drops may help with comfort. Husband advised on discharge. Noted that patient has black eye makeup on with flakes and left side of pillow. Advised cool compresses.

## 2022-10-19 NOTE — Progress Notes (Deleted)
GYNECOLOGY  VISIT   HPI: 66 y.o.   Legally Separated  Caucasian  female   [phone] with Patient's last menstrual period was 10/29/2006 (approximate).   here for   7 week f/u  GYNECOLOGIC HISTORY: Patient's last menstrual period was 10/29/2006 (approximate). Contraception:  post menopausal Menopausal hormone therapy:  n/a Last mammogram:  01/30/17 BIR[facility] 4:Suspicious/density c  Last pap smear:   08/10/15 normal,:no HR HPV done           OB History     Gravida  2   Para  2   Term  2   Preterm      AB  0   Living  2      SAB  0   IAB      Ectopic  0   Multiple      Live Births                 Patient Active Problem List   Diagnosis Date Noted   Dysphagia 09/17/2022   Early satiety 09/17/2022   Abdominal pain 09/17/2022   Acute diarrhea 09/17/2022   Hearing loss 08/29/2022   Thumb pain, right 02/22/2022   Osteoarthritis of right thumb 02/22/2022   Actinic keratoses 09/11/2021   Migraine 06/08/2021   Atherosclerosis of aorta (HCC) 04/10/2021   Chronic sinusitis 01/18/2021   Ingrown toenail 03/31/2020   Vertigo 12/30/2019   Palpitations 12/30/2019   Stress at home 09/17/2019   Recurrent oral herpes simplex 08/29/2019   Biceps tendonitis 12/10/2018   Vaginitis 11/17/2018   Shoulder pain 11/17/2018   Hypokalemia    HCAP (healthcare-associated pneumonia)    Thrombocytopenia ([place]) 10/01/2018   Meteorism 05/16/2018   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   Bursitis 05/21/2017   Knee pain, chronic 05/21/2017   Cough 04/04/2017   Multiple lung nodules on CT assoc with cylindrical bronchiectasis  04/04/2017   Change in voice 04/04/2017   Fatigue 02/08/2016   Status post total abdominal hysterectomy and bilateral salpingo-oophorectomy 01/10/2016   Urinary incontinence 11/09/2015   Vaginal discomfort 08/10/2015   Rectocele 08/10/2015   Well adult exam 09/03/2014   COPD GOLD I  09/03/2014   UTI (urinary tract infection) 06/09/2014    Dysplastic toenail 09/02/2013   Aphthous ulcer 06/11/2013   Dark urine 03/18/2013   Rash 06/08/2012   Contact dermatitis 04/13/2011   PRURITUS 12/01/2010   EXPOSURE TO HAZARDOUS BODY FLUID, HX OF 05/29/2010   BRONCHITIS, ACUTE 12/05/2009   HIP PAIN 12/05/2009   RUQ PAIN 02/04/2009   WRIST PAIN 05/24/2008   CERVICAL STRAIN 05/24/2008   WARTS, VIRAL, UNSPECIFIED 04/06/2008   NEOPLASM, SKIN, UNCERTAIN BEHAVIOR 75/30/0511   Vitamin D deficiency 11/13/2007   URI (upper respiratory infection) 11/11/2007   Hypothyroidism 08/05/2007   Bipolar I disorder ([place]) 08/05/2007   GERD (gastroesophageal reflux disease) 08/05/2007   Anxiety disorder 07/23/2007   Low back pain 07/23/2007   Myalgia and myositis 07/23/2007   B12 deficiency 05/22/2007   IRON DEFICIENCY 05/22/2007    Past Medical History:  Diagnosis Date   Anxiety    Bronchiectasis (HCC)    Chronic back pain    Chronic neck pain    Depression    bipolar- Dr [name]   Fatigue    Fibromyalgia    GERD (gastroesophageal reflux disease)    Headache    Hypertension    Hypothyroidism    Low back pain    Dr Arnoldo Morale   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[facility])    Osteoarthritis  Pneumonia    Pulmonary nodule    Sinus congestion    Thyroid disease    hypothyroidsm   Vertigo    Vitamin B12 deficiency     Past Surgical History:  Procedure Laterality Date   ABDOMINAL HYSTERECTOMY N/A 01/10/2016   Procedure: HYSTERECTOMY ABDOMINAL;  Surgeon: Nunzio Cobbs, MD;  Location: [facility];  Service: Gynecology;  Laterality: N/A;   ABDOMINAL SACROCOLPOPEXY N/A 01/10/2016   Procedure: ABDOMINO SACROCOLPOPEXY ;  Surgeon: Nunzio Cobbs, MD;  Location: [facility];  Service: Gynecology;  Laterality: N/A;   ANTERIOR AND POSTERIOR REPAIR N/A 01/10/2016   Procedure:  POSTERIOR REPAIR (RECTOCELE);  Surgeon: Nunzio Cobbs, MD;  Location: [facility];  Service: Gynecology;  Laterality: N/A;   BLADDER SUSPENSION N/A 01/10/2016    Procedure: TRANSVAGINAL TAPE (TVT) PROCEDURE exact midurethral sling;  Surgeon: Nunzio Cobbs, MD;  Location: [facility];  Service: Gynecology;  Laterality: N/A;   CERVICAL LAMINECTOMY  2001 and 2995   Dr Harmon Pier N/A 01/10/2016   Procedure: CYSTOSCOPY;  Surgeon: Nunzio Cobbs, MD;  Location: [facility];  Service: Gynecology;  Laterality: N/A;   SALPINGOOPHORECTOMY Bilateral 01/10/2016   Procedure: BILATERAL SALPINGO OOPHORECTOMY;  Surgeon: Nunzio Cobbs, MD;  Location: [facility];  Service: Gynecology;  Laterality: Bilateral;   TUBAL LIGATION  1980    Current Outpatient Medications  Medication Sig Dispense Refill   albuterol (VENTOLIN HFA) 108 (90 Base) MCG/ACT inhaler INHALE TWO PUFFS BY MOUTH EVERY 4 HOURS AS NEEDED FOR wheezing OR SHORTNESS OF BREATH 8.5 g 5   ALPRAZolam (XANAX) 1 MG tablet Take 1 mg by mouth 3 (three) times daily.     Cholecalciferol (VITAMIN D3) 1.25 MG (50000 UT) CAPS TAKE ONE CAPSULE BY MOUTH EVERY 14 DAYS 6 capsule 3   cyanocobalamin (VITAMIN B12) 1000 MCG/ML injection INJECT 1ML INTO THE SKIN EVERY 14 DAYS 6 mL 3   diclofenac Sodium (VOLTAREN) 1 % GEL APPLY TWO GRAMS TO AFFECTED AREA(S) FOUR TIMES DAILY (Patient taking differently: Apply 2 g topically 4 (four) times daily as needed (shoulder pain).) 200 g 3   fluticasone (FLONASE) 50 MCG/ACT nasal spray INSTILL TWO SPRAYS IN EACH NOSTRIL DAILY 48 g 3   levocetirizine (XYZAL) 5 MG tablet TAKE 1 TABLET BY MOUTH EVERY EVENING (FOR ALLERGY) (Patient taking differently: Take 5 mg by mouth daily as needed for allergies.) 90 tablet 3   levothyroxine (SYNTHROID) 50 MCG tablet Take 1 tablet (50 mcg total) by mouth daily before breakfast. 90 tablet 3   lidocaine (LIDODERM) 5 % Place 1-2 patches onto the skin daily. Remove & Discard patch within 12 hours or as directed by MD (Patient taking differently: Place 1-2 patches onto the skin daily as needed (pain). Remove & Discard patch within 12 hours or as  directed by MD) 60 patch 3   losartan (COZAAR) 25 MG tablet Take 1 tablet (25 mg total) by mouth daily. Follow-up appt due in Feb must see provider for future refills 30 tablet 2   methocarbamol (ROBAXIN) 500 MG tablet Take 1 tablet (500 mg total) by mouth 3 (three) times daily as needed for muscle spasms. 30 tablet 0   methylphenidate (RITALIN) 20 MG tablet Take 1 tablet (20 mg total) by mouth daily.  0   oxybutynin (DITROPAN XL) 10 MG 24 hr tablet Take 1 tablet (10 mg total) by mouth at bedtime. 30 tablet 1   pantoprazole (  PROTONIX) 40 MG tablet TAKE 1 TABLET BY MOUTH TWICE DAILY 180 tablet 3   promethazine (PHENERGAN) 25 MG tablet TAKE 1/2 TO 1 TABLET BY MOUTH EVERY 8 HOURS AS NEEDED FOR NAUSEA AND VOMITING 20 tablet 1   QUEtiapine (SEROQUEL) 25 MG tablet Take 25 mg by mouth at bedtime.     rizatriptan (MAXALT) 10 MG tablet Take 1 tablet (10 mg total) by mouth once as needed for up to 1 dose for migraine. May repeat in 2 hours if needed (Patient not taking: Reported on 10/16/2022) 12 tablet 5   Tiotropium Bromide Monohydrate (SPIRIVA RESPIMAT) 2.5 MCG/ACT AERS Inhale 1 each into the lungs daily.     triamcinolone ointment (KENALOG) 0.1 % Apply topically 2 (two) times daily as needed. to affected area(s) (Patient not taking: Reported on 10/16/2022) 80 g 3   No current facility-administered medications for this visit.     ALLERGIES: Flagyl [metronidazole], Doxycycline hyclate, Dulera [mometasone furo-formoterol fum], Gabapentin, Nabumetone, Sulfa antibiotics, Tetracycline hcl, and Tramadol hcl  Family History  Problem Relation Age of Onset   Heart attack Father        dec heart disease age 23   Hypertension Father    Lung disease Father    Cancer Mother        dec stomach ca--age 102   Breast cancer Cousin    Arthritis Other    Breast cancer Paternal Aunt     Social History   Socioeconomic History   Marital status: Legally Separated    Spouse name: Not on file   Number of children:  Not on file   Years of education: Not on file   Highest education level: Not on file  Occupational History   Not on file  Tobacco Use   Smoking status: Former    Packs/day: 1.00    Years: 35.00    Total pack years: 35.00    Types: Cigarettes    Start date: 11/21/1962    Quit date: 05/06/2011    Years since quitting: 11.4   Smokeless tobacco: Never   Tobacco comments:    stopped periodically - peak rate of 1 ppd  Vaping Use   Vaping Use: Never used  Substance and Sexual Activity   Alcohol use: No    Alcohol/week: 0.0 standard drinks of alcohol   Drug use: No   Sexual activity: Not Currently    Birth control/protection: Post-menopausal, Surgical    Comment: Tubal/Hyst/BSO  Other Topics Concern   Not on file  Social History Narrative   [facility] (04/04/17):   Originally from [place], [place]. 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 Recently had a persian cat that passed. No bird exposure. She denies any indoor plants currently. She does use wood burning for heat. She reports there is water getting under her foundation. She has mold in her home that she reports is in the 2nd floor of her home. She has recently started seeing mold under her bed. She reports her home smells musty & damp. She has always lived in [place].    Social Determinants of Health   Financial Resource Strain: High Risk (12/25/2021)   Overall Financial Resource Strain (CARDIA)    Difficulty of Paying Living Expenses: Very hard  Food Insecurity: Food Insecurity Present (12/25/2021)   Hunger Vital Sign    Worried About Running Out of Food in the Last Year: Often true    Ran Out of  Food in the Last Year: Often true  Transportation Needs: No Transportation Needs (12/25/2021)   PRAPARE - Hydrologist (Medical): No    Lack of Transportation (Non-Medical): No  Physical Activity: Inactive (12/25/2021)   Exercise Vital Sign    Days of Exercise  per Week: 0 days    Minutes of Exercise per Session: 0 min  Stress: Stress Concern Present (12/25/2021)   [place]    Feeling of Stress : Very much  Social Connections: Socially Isolated (12/25/2021)   Social Connection and Isolation Panel [NHANES]    Frequency of Communication with Friends and Family: More than three times a week    Frequency of Social Gatherings with Friends and Family: Never    Attends Religious Services: Never    Marine scientist or Organizations: No    Attends Archivist Meetings: Never    Marital Status: Separated  Intimate Partner Violence: Not At Risk (12/25/2021)   Humiliation, Afraid, Rape, and Kick questionnaire    Fear of Current or Ex-Partner: No    Emotionally Abused: No    Physically Abused: No    Sexually Abused: No    Review of Systems  PHYSICAL EXAMINATION:    LMP 10/29/2006 (Approximate)     General appearance: alert, cooperative and appears stated age Head: Normocephalic, without obvious abnormality, atraumatic Neck: no adenopathy, supple, symmetrical, trachea midline and thyroid normal to inspection and palpation Lungs: clear to auscultation bilaterally Breasts: normal appearance, no masses or tenderness, No nipple retraction or dimpling, No nipple discharge or bleeding, No axillary or supraclavicular adenopathy Heart: regular rate and rhythm Abdomen: soft, non-tender, no masses,  no organomegaly Extremities: extremities normal, atraumatic, no cyanosis or edema Skin: Skin color, texture, turgor normal. No rashes or lesions Lymph nodes: Cervical, supraclavicular, and axillary nodes normal. No abnormal inguinal nodes palpated Neurologic: Grossly normal  Pelvic: External genitalia:  no lesions              Urethra:  normal appearing urethra with no masses, tenderness or lesions              Bartholins and Skenes: normal                 Vagina: normal appearing  vagina with normal color and discharge, no lesions              Cervix: no lesions                Bimanual Exam:  Uterus:  normal size, contour, position, consistency, mobility, non-tender              Adnexa: no mass, fullness, tenderness              Rectal exam: {yes no:[id number]}.  Confirms.              Anus:  normal sphincter tone, no lesions  Chaperone was present for exam:  ***  ASSESSMENT     PLAN     An After Visit Summary was printed and given to the patient.  ______ minutes face to face time of which over 50% was spent in counseling.

## 2022-10-19 NOTE — Transfer of Care (Signed)
Immediate Anesthesia Transfer of Care Note  Patient: Melody [name]  Procedure(s) Performed: COLONOSCOPY WITH PROPOFOL ESOPHAGOGASTRODUODENOSCOPY (EGD) WITH PROPOFOL ESOPHAGEAL DILATION BIOPSY SAVORY DILATION POLYPECTOMY INTESTINAL  Patient Location: Endoscopy Unit  Anesthesia Type:General  Level of Consciousness: awake, alert , oriented, and patient cooperative  Airway & Oxygen Therapy: Patient Spontanous Breathing  Post-op Assessment: Report given to RN, Post -op Vital signs reviewed and stable, and Patient moving all extremities  Post vital signs: Reviewed and stable  Last Vitals:  Vitals Value Taken Time  BP    Temp    Pulse    Resp    SpO2      Last Pain:  Vitals:   10/19/22 1012  TempSrc:   PainSc: 0-No pain      Patients Stated Pain Goal: 3 (55/73/22 0254)  Complications: No notable events documented.

## 2022-10-19 NOTE — Discharge Instructions (Addendum)
You are being discharged to home.  Resume your previous diet.  We are waiting for your pathology results.  Continue your present medications.  Your physician has recommended a repeat colonoscopy for surveillance based on pathology results.  Take Imodium as needed if more than 4 bowel movements per day or if presenting fecal urgency

## 2022-10-19 NOTE — Anesthesia Preprocedure Evaluation (Signed)
Anesthesia Evaluation  Patient identified by MRN, date of birth, ID band Patient awake    Reviewed: Allergy & Precautions, H&P , NPO status , Patient's Chart, lab work & pertinent test results, reviewed documented beta blocker date and time   Airway Mallampati: II  TM Distance: >3 FB Neck ROM: full    Dental no notable dental hx.    Pulmonary COPD, former smoker   Pulmonary exam normal breath sounds clear to auscultation       Cardiovascular Exercise Tolerance: Good hypertension, negative cardio ROS  Rhythm:regular Rate:Normal     Neuro/Psych  Headaches PSYCHIATRIC DISORDERS Anxiety Depression Bipolar Disorder    Neuromuscular disease    GI/Hepatic Neg liver ROS,GERD  Medicated,,  Endo/Other  Hypothyroidism    Renal/GU negative Renal ROS  negative genitourinary   Musculoskeletal   Abdominal   Peds  Hematology  (+) Blood dyscrasia, anemia   Anesthesia Other Findings   Reproductive/Obstetrics negative OB ROS                             Anesthesia Physical Anesthesia Plan  ASA: 2  Anesthesia Plan: General   Post-op Pain Management:    Induction:   PONV Risk Score and Plan: Propofol infusion  Airway Management Planned:   Additional Equipment:   Intra-op Plan:   Post-operative Plan:   Informed Consent: I have reviewed the patients History and Physical, chart, labs and discussed the procedure including the risks, benefits and alternatives for the proposed anesthesia with the patient or authorized representative who has indicated his/her understanding and acceptance.     Dental Advisory Given  Plan Discussed with: CRNA  Anesthesia Plan Comments:        Anesthesia Quick Evaluation

## 2022-10-19 NOTE — Anesthesia Postprocedure Evaluation (Signed)
Anesthesia Post Note  Patient: Melody [name]  Procedure(s) Performed: COLONOSCOPY WITH PROPOFOL ESOPHAGOGASTRODUODENOSCOPY (EGD) WITH PROPOFOL ESOPHAGEAL DILATION BIOPSY SAVORY DILATION POLYPECTOMY INTESTINAL  Patient location during evaluation: Phase II Anesthesia Type: General Level of consciousness: awake Pain management: pain level controlled Vital Signs Assessment: post-procedure vital signs reviewed and stable Respiratory status: spontaneous breathing and respiratory function stable Cardiovascular status: blood pressure returned to baseline and stable Postop Assessment: no headache and no apparent nausea or vomiting Anesthetic complications: no Comments: Late entry   No notable events documented.   Last Vitals:  Vitals:   10/19/22 1055 10/19/22 1059  BP: (!) 95/47 (!) 104/48  Pulse: 74 81  Resp: 20 20  Temp: 36.4 C   SpO2: 96% 98%    Last Pain:  Vitals:   10/19/22 1114  TempSrc:   PainSc: [place]

## 2022-10-23 LAB — SURGICAL PATHOLOGY

## 2022-10-24 ENCOUNTER — Encounter (INDEPENDENT_AMBULATORY_CARE_PROVIDER_SITE_OTHER): Payer: Self-pay | Admitting: *Deleted

## 2022-10-31 ENCOUNTER — Ambulatory Visit: Payer: Medicare Other | Admitting: Obstetrics and Gynecology

## 2022-10-31 ENCOUNTER — Encounter (HOSPITAL_COMMUNITY): Payer: Self-pay | Admitting: Gastroenterology

## 2022-11-12 ENCOUNTER — Other Ambulatory Visit: Payer: Self-pay | Admitting: Obstetrics and Gynecology

## 2022-11-12 DIAGNOSIS — N3281 Overactive bladder: Secondary | ICD-10-CM

## 2022-11-13 NOTE — Telephone Encounter (Signed)
Med refill request:Ditropan 10 mg 24 hr tab  Last AEX: 01/30/2018; OV 09/10/22 -BS Next AEX: Not scheduled Last MMG (if hormonal med) N/A  Per review of 09/10/22 OV note, need 7 week f/u. Patient cancelled OV scheduled for 10/30/22.   Call placed to patient, left detailed message, ok per dpr. Advised per last OV note, need 7 week f/u, please return call to office to schedule at [phone].   Dr. Quincy Simmonds - Refill authorized: Please Advise?

## 2022-11-26 ENCOUNTER — Encounter: Payer: Self-pay | Admitting: Cardiology

## 2022-11-26 ENCOUNTER — Ambulatory Visit: Payer: 59 | Attending: Cardiology | Admitting: Cardiology

## 2022-11-26 ENCOUNTER — Ambulatory Visit (INDEPENDENT_AMBULATORY_CARE_PROVIDER_SITE_OTHER): Payer: 59

## 2022-11-26 VITALS — BP 132/84 | HR 75 | Ht 66.0 in | Wt 147.0 lb

## 2022-11-26 DIAGNOSIS — R002 Palpitations: Secondary | ICD-10-CM

## 2022-11-26 DIAGNOSIS — I10 Essential (primary) hypertension: Secondary | ICD-10-CM

## 2022-11-26 NOTE — Progress Notes (Signed)
Cardiology Office Note  Date: 11/26/2022   ID: Melody [name], DOB 11-03-1955, MRN [id number]  PCP:  Cassandria Anger, MD  Cardiologist:  Rozann Lesches, MD Electrophysiologist:  None   Chief Complaint  Patient presents with   Palpitations    History of Present Illness: Melody [name] is an 67 y.o. female referred for cardiology consultation by Ms. Triplett PA-C for evaluation of palpitations per record review.  She states that she periodically senses heart skips and irregularity, sometimes more of a fast heartbeat.  This is not predictable, not associated with chest pain or syncope.  May happen once or twice in a week, but not necessarily.  This has been going on at least for a year.  I did review her chart, she actually wore a heart monitor per Dr. Luan Pulling back in 2020 that demonstrated isolated PVCs, no arrhythmias.  I reviewed her medications, she is currently on losartan for treatment of fairly recently diagnosed hypertension.  I went over her most recent lab work which is noted below.  Past Medical History:  Diagnosis Date   Anxiety    Bronchiectasis (HCC)    Chronic back pain    Chronic neck pain    Depression    bipolar- Dr [name]   Fibromyalgia    GERD (gastroesophageal reflux disease)    Headache    Hypertension    Hypothyroidism    Low back pain    Dr Arnoldo Morale   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[facility])    Osteoarthritis    Pneumonia    Pulmonary nodule    Vertigo    Vitamin B12 deficiency     Past Surgical History:  Procedure Laterality Date   ABDOMINAL HYSTERECTOMY N/A 01/10/2016   Procedure: HYSTERECTOMY ABDOMINAL;  Surgeon: Nunzio Cobbs, MD;  Location: [facility];  Service: Gynecology;  Laterality: N/A;   ABDOMINAL SACROCOLPOPEXY N/A 01/10/2016   Procedure: ABDOMINO SACROCOLPOPEXY ;  Surgeon: Nunzio Cobbs, MD;  Location: [facility] [facility];  Service: Gynecology;  Laterality: N/A;   ANTERIOR AND POSTERIOR REPAIR N/A 01/10/2016    Procedure:  POSTERIOR REPAIR (RECTOCELE);  Surgeon: Nunzio Cobbs, MD;  Location: [facility];  Service: Gynecology;  Laterality: N/A;   BIOPSY  10/19/2022   Procedure: BIOPSY;  Surgeon: Harvel Quale, MD;  [facility];  Service: Gastroenterology;;   BLADDER SUSPENSION N/A 01/10/2016   Procedure: TRANSVAGINAL TAPE (TVT) PROCEDURE exact midurethral sling;  Surgeon: Nunzio Cobbs, MD;  Location: [facility];  Service: Gynecology;  Laterality: N/A;   CERVICAL LAMINECTOMY  2001 and 2995   Dr Arnoldo Morale   COLONOSCOPY WITH PROPOFOL N/A 10/19/2022   Procedure: COLONOSCOPY WITH PROPOFOL;  Surgeon: Harvel Quale, MD;  [facility];  Service: Gastroenterology;  Laterality: N/A;  10:30am, asa 1-2   CYSTO N/A 01/10/2016   Procedure: CYSTOSCOPY;  Surgeon: Nunzio Cobbs, MD;  Location: [facility];  Service: Gynecology;  Laterality: N/A;   ESOPHAGEAL DILATION N/A 10/19/2022   Procedure: ESOPHAGEAL DILATION;  Surgeon: Harvel Quale, MD;  [facility];  Service: Gastroenterology;  Laterality: N/A;   ESOPHAGOGASTRODUODENOSCOPY (EGD) WITH PROPOFOL N/A 10/19/2022   Procedure: ESOPHAGOGASTRODUODENOSCOPY (EGD) WITH PROPOFOL;  Surgeon: Harvel Quale, MD;  [facility];  Service: Gastroenterology;  Laterality: N/A;   POLYPECTOMY  10/19/2022   Procedure: POLYPECTOMY INTESTINAL;  Surgeon: Harvel Quale, MD;  [facility];  Service: Gastroenterology;;   SALPINGOOPHORECTOMY  Bilateral 01/10/2016   Procedure: BILATERAL SALPINGO OOPHORECTOMY;  Surgeon: Nunzio Cobbs, MD;  Location: [facility];  Service: Gynecology;  Laterality: Bilateral;   SAVORY DILATION  10/19/2022   Procedure: SAVORY DILATION;  Surgeon: Montez Morita, Quillian Quince, MD;  [facility];  Service: Gastroenterology;;   TUBAL LIGATION  1980    Current Outpatient Medications  Medication Sig Dispense Refill    albuterol (VENTOLIN HFA) 108 (90 Base) MCG/ACT inhaler INHALE TWO PUFFS BY MOUTH EVERY 4 HOURS AS NEEDED FOR wheezing OR SHORTNESS OF BREATH 8.5 g 5   ALPRAZolam (XANAX) 1 MG tablet Take 1 mg by mouth 3 (three) times daily.     Cholecalciferol (VITAMIN D3) 1.25 MG (50000 UT) CAPS TAKE ONE CAPSULE BY MOUTH EVERY 14 DAYS 6 capsule 3   cyanocobalamin (VITAMIN B12) 1000 MCG/ML injection INJECT 1ML INTO THE SKIN EVERY 14 DAYS 6 mL 3   diclofenac Sodium (VOLTAREN) 1 % GEL APPLY TWO GRAMS TO AFFECTED AREA(S) FOUR TIMES DAILY (Patient taking differently: Apply 2 g topically 4 (four) times daily as needed (shoulder pain).) 200 g 3   fluticasone (FLONASE) 50 MCG/ACT nasal spray INSTILL TWO SPRAYS IN EACH NOSTRIL DAILY 48 g 3   levocetirizine (XYZAL) 5 MG tablet TAKE 1 TABLET BY MOUTH EVERY EVENING (FOR ALLERGY) (Patient taking differently: Take 5 mg by mouth daily as needed for allergies.) 90 tablet 3   levothyroxine (SYNTHROID) 50 MCG tablet Take 1 tablet (50 mcg total) by mouth daily before breakfast. 90 tablet 3   lidocaine (LIDODERM) 5 % Place 1-2 patches onto the skin daily. Remove & Discard patch within 12 hours or as directed by MD (Patient taking differently: Place 1-2 patches onto the skin daily as needed (pain). Remove & Discard patch within 12 hours or as directed by MD) 60 patch 3   losartan (COZAAR) 25 MG tablet Take 1 tablet (25 mg total) by mouth daily. Follow-up appt due in Feb must see provider for future refills 30 tablet 2   methocarbamol (ROBAXIN) 500 MG tablet Take 1 tablet (500 mg total) by mouth 3 (three) times daily as needed for muscle spasms. 30 tablet 0   methylphenidate (RITALIN) 20 MG tablet Take 1 tablet (20 mg total) by mouth daily.  0   oxybutynin (DITROPAN-XL) 10 MG 24 hr tablet Take 1 tablet (10 mg total) by mouth at bedtime. Needs office visit for any additional refills. 30 tablet 0   pantoprazole (PROTONIX) 40 MG tablet TAKE 1 TABLET BY MOUTH TWICE DAILY 180 tablet 3    promethazine (PHENERGAN) 25 MG tablet TAKE 1/2 TO 1 TABLET BY MOUTH EVERY 8 HOURS AS NEEDED FOR NAUSEA AND VOMITING 20 tablet 1   QUEtiapine (SEROQUEL) 25 MG tablet Take 25 mg by mouth at bedtime.     rizatriptan (MAXALT) 10 MG tablet Take 1 tablet (10 mg total) by mouth once as needed for up to 1 dose for migraine. May repeat in 2 hours if needed 12 tablet 5   Tiotropium Bromide Monohydrate (SPIRIVA RESPIMAT) 2.5 MCG/ACT AERS Inhale 1 each into the lungs daily.     triamcinolone ointment (KENALOG) 0.1 % Apply topically 2 (two) times daily as needed. to affected area(s) 80 g 3   No current facility-administered medications for this visit.   Allergies:  Flagyl [metronidazole], Doxycycline hyclate, Dulera [mometasone furo-formoterol fum], Gabapentin, Nabumetone, Sulfa antibiotics, Tetracycline hcl, and Tramadol hcl   Social History: The patient  reports that she quit smoking about 11 years ago.  Her smoking use included cigarettes. She started smoking about 60 years ago. She has a 35.00 pack-year smoking history. She has never used smokeless tobacco. She reports that she does not drink alcohol and does not use drugs.   Family History: The patient's family history includes Arthritis in an other family member; Breast cancer in her cousin and paternal aunt; Cancer in her mother; Heart attack in her father; Hypertension in her father; Lung disease in her father.   ROS: No syncope.  Physical Exam: VS:  BP 132/84   Pulse 75   Ht '5\' 6"'$  (1.676 m)   Wt 147 lb (66.7 kg)   LMP 10/29/2006 (Approximate)   SpO2 96%   BMI 23.73 kg/m , BMI Body mass index is 23.73 kg/m.  Wt Readings from Last 3 Encounters:  11/26/22 147 lb (66.7 kg)  10/19/22 145 lb (65.8 kg)  09/17/22 149 lb 4.8 oz (67.7 kg)    General: Patient appears comfortable at rest. HEENT: Conjunctiva and lids normal. Neck: Supple, no elevated JVP or carotid bruits. Lungs: Coarse breath sounds without wheezing, nonlabored breathing at  rest. Cardiac: Regular rate and rhythm, no S3 or significant systolic murmur, no pericardial rub. Abdomen: Soft, bowel sounds present. Extremities: No pitting edema, distal pulses 2+. Skin: Warm and dry. Musculoskeletal: No kyphosis. Neuropsychiatric: Alert and oriented x3, affect grossly appropriate.  ECG:  An ECG dated 09/23/2022 was personally reviewed today and demonstrated:  Sinus rhythm with nonspecific ST changes and lead motion artifact.  Recent Labwork: 09/19/2022: ALT 20; AST 19; TSH 4.73 09/23/2022: BUN 18; Creatinine, Ser 0.84; Hemoglobin 13.9; Platelets 178; Potassium 3.6; Sodium 140     Component Value Date/Time   CHOL 197 05/28/2022 1436   TRIG 86.0 05/28/2022 1436   HDL 59.90 05/28/2022 1436   CHOLHDL 3 05/28/2022 1436   VLDL 17.2 05/28/2022 1436   LDLCALC 120 (H) 05/28/2022 1436    Other Studies Reviewed Today:  Lexiscan Myoview 02/13/2018: Diffuse nonspecific ST-T abnormalities with stress. Defect 1: There is a medium defect of moderate severity present in the basal inferoseptal and mid inferoseptal location. As regional wall motion was normal, findings appeared to be consistent with soft tissue attenuation artifact. This is a low risk study. No significant ischemic territories. Nuclear stress EF: 62%.  Event recorder July 2020: 14 day event monitor Min HR 45, Max HR 125, Avg HR 73. No symptoms reported Telemetry tracings show sinus rhythm, isolated PVC No significant arrhythmias  Assessment and Plan:  1.  Longstanding history of intermittent palpitations without syncope or prior history of cardiac arrhythmia.  She did wear a cardiac monitor back in 2020 that demonstrated isolated PVCs.  I reviewed her recent lab work and ECG from [place] encounter.  We will plan a 14-day Zio patch for further investigation of heart rhythm.  2.  Essential hypertension, currently on Cozaar with follow-up by PCP.  Medication Adjustments/Labs and Tests Ordered: Current medicines  are reviewed at length with the patient today.  Concerns regarding medicines are outlined above.   Tests Ordered: Orders Placed This Encounter  Procedures   LONG TERM MONITOR (3-14 DAYS)    Medication Changes: No orders of the defined types were placed in this encounter.   Disposition:  Follow up  test results.  Signed, Satira Sark, MD, [facility] 11/26/2022 3:12 PM    [place] [facility] [address] [place], [place] [address] Phone: [phone]; Fax: [phone]

## 2022-11-26 NOTE — Patient Instructions (Signed)
Medication Instructions:  Your physician recommends that you continue on your current medications as directed. Please refer to the Current Medication list given to you today.   Labwork: None today  Testing/Procedures: ZIO XT- Long Term Monitor Instructions   Your physician has requested you wear your ZIO patch monitor_____14__days.   This is a single patch monitor.  [employer] supplies one patch monitor per enrollment.  Additional stickers are not available.   Please do not apply patch if you will be having a Nuclear Stress Test, Echocardiogram, Cardiac CT, MRI, or Chest Xray during the time frame you would be wearing the monitor. The patch cannot be worn during these tests.  You cannot remove and re-apply the ZIO XT patch monitor.   Your ZIO patch monitor will be sent USPS Priority mail from [facility] directly to your home address. The monitor may also be mailed to a PO BOX if home delivery is not available.   It may take 3-5 days to receive your monitor after you have been enrolled.   Do not shower for the first 24 hours.  You may shower after the first 24 hours.   Press button if you feel a symptom. You will hear a small click.  Record Date, Time and Symptom in the Patient Log Book.   When you are ready to remove patch, follow instructions on last 2 pages of Patient Log Book.  Stick patch monitor onto last page of Patient Log Book.   Place Patient Log Book in [place] box.  Use locking tab on box and tape box closed securely.  The [employer] has [employer] on it.  Please place in mailbox as soon as possible.  Your physician should have your test results approximately 7 days after the monitor has been mailed back to [facility].   Call [place] at [phone] if you have questions regarding your ZIO XT patch monitor.  Call them immediately if you see an orange [place] blinking on your monitor.   If your monitor falls off in less than 4 days  contact our Monitor department at [phone].  If your monitor becomes loose or falls off after 4 days call [employer] at [phone] for suggestions on securing your monitor.    Follow-Up: To be determined after monitor results are back.  Any Other Special Instructions Will Be Listed Below (If Applicable).  If you need a refill on your cardiac medications before your next appointment, please call your pharmacy.

## 2022-11-27 ENCOUNTER — Other Ambulatory Visit: Payer: Self-pay | Admitting: Internal Medicine

## 2022-11-27 DIAGNOSIS — J449 Chronic obstructive pulmonary disease, unspecified: Secondary | ICD-10-CM

## 2022-11-30 ENCOUNTER — Other Ambulatory Visit: Payer: Self-pay

## 2022-11-30 ENCOUNTER — Encounter (HOSPITAL_COMMUNITY): Payer: Self-pay

## 2022-11-30 ENCOUNTER — Emergency Department (HOSPITAL_COMMUNITY): Payer: 59

## 2022-11-30 ENCOUNTER — Emergency Department (HOSPITAL_COMMUNITY)
Admission: EM | Admit: 2022-11-30 | Discharge: 2022-12-01 | Disposition: A | Discharge status: Home / Self Care | Payer: 59 | Attending: Emergency Medicine | Admitting: Emergency Medicine

## 2022-11-30 DIAGNOSIS — F430 Acute stress reaction: Secondary | ICD-10-CM | POA: Diagnosis not present

## 2022-11-30 DIAGNOSIS — E039 Hypothyroidism, unspecified: Secondary | ICD-10-CM | POA: Insufficient documentation

## 2022-11-30 DIAGNOSIS — Z79890 Hormone replacement therapy (postmenopausal): Secondary | ICD-10-CM | POA: Diagnosis not present

## 2022-11-30 DIAGNOSIS — R002 Palpitations: Secondary | ICD-10-CM | POA: Diagnosis not present

## 2022-11-30 DIAGNOSIS — I493 Ventricular premature depolarization: Secondary | ICD-10-CM | POA: Insufficient documentation

## 2022-11-30 DIAGNOSIS — E876 Hypokalemia: Secondary | ICD-10-CM

## 2022-11-30 LAB — CBC
HCT: 38.6 % (ref 36.0–46.0)
Hemoglobin: 13 g/dL (ref 12.0–15.0)
MCH: 29.5 pg (ref 26.0–34.0)
MCHC: 33.7 g/dL (ref 30.0–36.0)
MCV: 87.7 fL (ref 80.0–100.0)
Platelets: 176 10*3/uL (ref 150–400)
RBC: 4.4 MIL/uL (ref 3.87–5.11)
RDW: 13.2 % (ref 11.5–15.5)
WBC: 6.6 10*3/uL (ref 4.0–10.5)
nRBC: 0 % (ref 0.0–0.2)

## 2022-11-30 LAB — BASIC METABOLIC PANEL
Anion gap: 7 (ref 5–15)
BUN: 18 mg/dL (ref 8–23)
CO2: 26 mmol/L (ref 22–32)
Calcium: 8.8 mg/dL — ABNORMAL LOW (ref 8.9–10.3)
Chloride: 105 mmol/L (ref 98–111)
Creatinine, Ser: 0.89 mg/dL (ref 0.44–1.00)
GFR, Estimated: >60 mL/min (ref >60)
Glucose, Bld: 116 mg/dL — ABNORMAL HIGH (ref 70–99)
Potassium: 3.2 mmol/L — ABNORMAL LOW (ref 3.5–5.1)
Sodium: 138 mmol/L (ref 135–145)

## 2022-11-30 NOTE — ED Notes (Signed)
EKG given to Dr. Zackowski  

## 2022-11-30 NOTE — ED Notes (Signed)
Patient says she had a panic attack earlier today and called the medics. Her BP was elevated at that time

## 2022-11-30 NOTE — ED Triage Notes (Signed)
Pt via POV c/o palpitations and high BP since earlier this afternoon. She was seen and treated last week for similar symptoms and has had a heart monitor on her chest since Monday or Tuesday of this week. Pt has also had increased anxiety and mild SOB intermittently.

## 2022-12-01 DIAGNOSIS — I493 Ventricular premature depolarization: Secondary | ICD-10-CM | POA: Diagnosis not present

## 2022-12-01 MED ORDER — POTASSIUM CHLORIDE CRYS ER 20 MEQ PO TBCR
40.0000 meq | EXTENDED_RELEASE_TABLET | Freq: Once | ORAL | Status: AC
  Administered 2022-12-01: 40 meq via ORAL
  Filled 2022-12-01: qty 2

## 2022-12-01 MED ORDER — MAGNESIUM OXIDE -MG SUPPLEMENT 400 (240 MG) MG PO TABS
800.0000 mg | ORAL_TABLET | ORAL | Status: AC
  Administered 2022-12-01: 800 mg via ORAL
  Filled 2022-12-01: qty 2

## 2022-12-01 MED ORDER — POTASSIUM CHLORIDE CRYS ER 20 MEQ PO TBCR: 20.0000 meq | EXTENDED_RELEASE_TABLET | Freq: Every day | ORAL | 0 refills | Status: DC

## 2022-12-01 MED ORDER — MAGNESIUM OXIDE 400 MG PO CAPS: 400.0000 mg | ORAL_CAPSULE | Freq: Every day | ORAL | 0 refills | Status: DC

## 2022-12-01 NOTE — ED Provider Notes (Signed)
[facility] Provider Note   CSN: [id number] Arrival date & time: 11/30/22  1916     History  Chief Complaint  Patient presents with   Hypertension   Palpitations    Melody [name] is a 67 y.o. female.  Patient presents to the emergency department with palpitations.  Patient has a long history of palpitations.  She was recently seen in cardiology for this and is currently wearing a Zio patch.  Patient reports that her palpitations seem to significantly increase whenever she is experiencing stress.  When this occurs she feels like she is having a "panic attack".  Patient had a very stressful event this afternoon and that precipitated palpitations.  Blood pressure was elevated as well.       Home Medications Prior to Admission medications   Medication Sig Start Date End Date Taking? Authorizing Provider  Magnesium Oxide 400 MG CAPS Take 1 capsule (400 mg total) by mouth daily. 12/01/22  Yes Cailen Mihalik, Gwenyth Allegra, MD  potassium chloride SA (KLOR-CON M) 20 MEQ tablet Take 1 tablet (20 mEq total) by mouth daily. 12/01/22  Yes Lynora Dymond, Gwenyth Allegra, MD  albuterol (VENTOLIN HFA) 108 (90 Base) MCG/ACT inhaler INHALE TWO PUFFS BY MOUTH EVERY 4 HOURS AS NEEDED FOR wheezing OR SHORTNESS OF BREATH 11/27/22   Plotnikov, Evie Lacks, MD  ALPRAZolam Duanne Moron) 1 MG tablet Take 1 mg by mouth 3 (three) times daily.    [provider]  Cholecalciferol (VITAMIN D3) 1.25 MG (50000 UT) CAPS TAKE ONE CAPSULE BY MOUTH EVERY 14 DAYS 09/13/22   Plotnikov, Evie Lacks, MD  cyanocobalamin (VITAMIN B12) 1000 MCG/ML injection INJECT 1ML INTO THE SKIN EVERY 14 DAYS 08/22/22   Plotnikov, Evie Lacks, MD  diclofenac Sodium (VOLTAREN) 1 % GEL APPLY TWO GRAMS TO AFFECTED AREA(S) FOUR TIMES DAILY Patient taking differently: Apply 2 g topically 4 (four) times daily as needed (shoulder pain). 02/22/22   Plotnikov, Evie Lacks, MD  fluticasone (FLONASE) 50 MCG/ACT nasal spray  INSTILL TWO SPRAYS IN EACH NOSTRIL DAILY 09/24/22   Plotnikov, Evie Lacks, MD  levocetirizine (XYZAL) 5 MG tablet TAKE 1 TABLET BY MOUTH EVERY EVENING (FOR ALLERGY) Patient taking differently: Take 5 mg by mouth daily as needed for allergies. 09/20/21   Plotnikov, Evie Lacks, MD  levothyroxine (SYNTHROID) 50 MCG tablet Take 1 tablet (50 mcg total) by mouth daily before breakfast. 02/22/22   Plotnikov, Evie Lacks, MD  lidocaine (LIDODERM) 5 % Place 1-2 patches onto the skin daily. Remove & Discard patch within 12 hours or as directed by MD Patient taking differently: Place 1-2 patches onto the skin daily as needed (pain). Remove & Discard patch within 12 hours or as directed by MD 05/28/22   Plotnikov, Evie Lacks, MD  losartan (COZAAR) 25 MG tablet Take 1 tablet (25 mg total) by mouth daily. Follow-up appt due in Feb must see provider for future refills 10/16/22   Plotnikov, Evie Lacks, MD  methocarbamol (ROBAXIN) 500 MG tablet Take 1 tablet (500 mg total) by mouth 3 (three) times daily as needed for muscle spasms. 10/16/22   Plotnikov, Evie Lacks, MD  methylphenidate (RITALIN) 20 MG tablet Take 1 tablet (20 mg total) by mouth daily. 11/10/18   Johnson, Clanford L, MD  oxybutynin (DITROPAN-XL) 10 MG 24 hr tablet Take 1 tablet (10 mg total) by mouth at bedtime. Needs office visit for any additional refills. 11/13/22   Nunzio Cobbs, MD  pantoprazole (PROTONIX) 40 MG tablet  TAKE 1 TABLET BY MOUTH TWICE DAILY 09/24/22   Carlan, Chelsea L, NP  promethazine (PHENERGAN) 25 MG tablet TAKE 1/2 TO 1 TABLET BY MOUTH EVERY 8 HOURS AS NEEDED FOR NAUSEA AND VOMITING 05/25/22   Plotnikov, Evie Lacks, MD  QUEtiapine (SEROQUEL) 25 MG tablet Take 25 mg by mouth at bedtime.    [provider]  rizatriptan (MAXALT) 10 MG tablet Take 1 tablet (10 mg total) by mouth once as needed for up to 1 dose for migraine. May repeat in 2 hours if needed 06/13/21   Plotnikov, Evie Lacks, MD  Tiotropium Bromide Monohydrate  (SPIRIVA RESPIMAT) 2.5 MCG/ACT AERS Inhale 1 each into the lungs daily.    [provider]  triamcinolone ointment (KENALOG) 0.1 % Apply topically 2 (two) times daily as needed. to affected area(s) 02/22/22   Plotnikov, Evie Lacks, MD      Allergies    Flagyl [metronidazole], Doxycycline hyclate, Dulera [mometasone furo-formoterol fum], Gabapentin, Nabumetone, Sulfa antibiotics, Tetracycline hcl, and Tramadol hcl    Review of Systems   Review of Systems  Physical Exam Updated Vital Signs BP (!) 145/77   Pulse (!) 59   Temp 98.3 F (36.8 C) (Oral)   Resp 15   Ht '5\' 6"'$  (1.676 m)   Wt 66.2 kg   LMP 10/29/2006 (Approximate)   SpO2 96%   BMI 23.57 kg/m  Physical Exam Vitals and nursing note reviewed.  Constitutional:      General: She is not in acute distress.    Appearance: She is well-developed.  HENT:     Head: Normocephalic and atraumatic.     Mouth/Throat:     Mouth: Mucous membranes are moist.  Eyes:     General: Vision grossly intact. Gaze aligned appropriately.     Extraocular Movements: Extraocular movements intact.     Conjunctiva/sclera: Conjunctivae normal.  Cardiovascular:     Rate and Rhythm: Normal rate and regular rhythm. Frequent Extrasystoles are present.    Pulses: Normal pulses.     Heart sounds: Normal heart sounds, S1 normal and S2 normal. No murmur heard.    No friction rub. No gallop.  Pulmonary:     Effort: Pulmonary effort is normal. No respiratory distress.     Breath sounds: Normal breath sounds.  Abdominal:     General: Bowel sounds are normal.     Palpations: Abdomen is soft.     Tenderness: There is no abdominal tenderness. There is no guarding or rebound.     Hernia: No hernia is present.  Musculoskeletal:        General: No swelling.     Cervical back: Full passive range of motion without pain, normal range of motion and neck supple. No spinous process tenderness or muscular tenderness. Normal range of motion.     Right lower leg:  No edema.     Left lower leg: No edema.  Skin:    General: Skin is warm and dry.     Capillary Refill: Capillary refill takes less than 2 seconds.     Findings: No ecchymosis, erythema, rash or wound.  Neurological:     General: No focal deficit present.     Mental Status: She is alert and oriented to person, place, and time.     GCS: GCS eye subscore is 4. GCS verbal subscore is 5. GCS motor subscore is 6.     Cranial Nerves: Cranial nerves 2-12 are intact.     Sensory: Sensation is intact.  Motor: Motor function is intact.     Coordination: Coordination is intact.  Psychiatric:        Attention and Perception: Attention normal.        Mood and Affect: Mood normal.        Speech: Speech normal.        Behavior: Behavior normal.     ED Results / Procedures / Treatments   Labs (all labs ordered are listed, but only abnormal results are displayed) Labs Reviewed  BASIC METABOLIC PANEL - Abnormal; Notable for the following components:      Result Value   Potassium 3.2 (*)    Glucose, Bld 116 (*)    Calcium 8.8 (*)    All other components within normal limits  CBC    EKG None  Radiology DG Chest 2 View  Result Date: 11/30/2022 CLINICAL DATA:  Palpitations. EXAM: CHEST - 2 VIEW COMPARISON:  09/23/2022. FINDINGS: The heart size and mediastinal contours are within normal limits. Scattered nodular opacities are noted in the lungs bilaterally, similar in appearance to the prior exam. No consolidation, effusion, or pneumothorax. Cervical spinal fusion hardware is present. No acute osseous abnormality. IMPRESSION: Stable nodular opacities in the lungs bilaterally, possibly associated with atypical infection and unchanged from the prior exam. Electronically Signed   By: Brett Fairy M.D.   On: 11/30/2022 20:56    Procedures Procedures    Medications Ordered in ED Medications  magnesium oxide (MAG-OX) tablet 800 mg (has no administration in time range)  potassium chloride SA  (KLOR-CON M) CR tablet 40 mEq (has no administration in time range)    ED Course/ Medical Decision Making/ A&P                             Medical Decision Making Amount and/or Complexity of Data Reviewed External Data Reviewed: labs, ECG and notes. Labs: ordered. Decision-making details documented in ED Course. Radiology: ordered. ECG/medicine tests: ordered and independent interpretation performed. Decision-making details documented in ED Course.  Risk OTC drugs. Prescription drug management.   Presents to the ER for evaluation of palpitations.  I did review her cardiology notes.  Patient has a longstanding history of palpitations.  She has no history of syncope.  She was studied in 2020 and monitoring at that time revealed only PVCs.  Here in the department she is having frequent, sometimes paired PVCs that coincide with her palpitation symptoms.  No other arrhythmia noted.  Patient is wearing a Zio patch, tells me that she did activate the device when she was having episodes earlier.  I do not have a way to collect data from this currently, she is to continue her course of this and mail back to the company so Dr. Domenic Polite can review.  Patient has a history of hypothyroidism.  TSH was actually high 2 months ago.  She does not have clinical symptoms that would suggest hyperthyroid at this time.  Patient does appear very anxious.  She reports being under a great deal of stress.  She also reports that historically her palpitations are worse when she is stressed.  I suspect that symptoms today are secondary to her chronic and known PVCs, no other arrhythmia is suspected were seen today.  Basic labs reveal mild hypokalemia.  Will provide magnesium and potassium to minimize palpitations.  Refer back to primary care to help with stress and anxiety.  Will also provide outpatient resources.  Final Clinical Impression(s) / ED Diagnoses Final diagnoses:  PVC's (premature  ventricular contractions)  Stress disorder, acute    Rx / DC Orders ED Discharge Orders          Ordered    Magnesium Oxide 400 MG CAPS  Daily        12/01/22 0043    potassium chloride SA (KLOR-CON M) 20 MEQ tablet  Daily        12/01/22 0043              Orpah Greek, MD 12/01/22 [phone]

## 2022-12-07 ENCOUNTER — Telehealth: Payer: Self-pay

## 2022-12-07 MED ORDER — MAGNESIUM OXIDE 400 MG PO CAPS: 400.0000 mg | ORAL_CAPSULE | Freq: Every day | ORAL | 0 refills | Status: DC

## 2022-12-07 MED ORDER — POTASSIUM CHLORIDE CRYS ER 20 MEQ PO TBCR: 20.0000 meq | EXTENDED_RELEASE_TABLET | Freq: Every day | ORAL | 0 refills | Status: DC

## 2022-12-07 NOTE — Telephone Encounter (Signed)
Sent enough refill until appt 12/17/22 to [facility].Marland KitchenJohny Chess

## 2022-12-07 NOTE — Telephone Encounter (Signed)
While speaking with patient about AWV appointment, patient asked that I send a message in regards to needing medications refilled that were prescribed while she was in the hospital. Patient states she only has one tablet left of: Magnesium Oxide 412m 1 tab qd Potassium CL ER 20 meq 1 qd  [facility] since she is not scheduled to come back in til 12/17/22 to see PCP.  Advised patient I would send a message to clinical team.

## 2022-12-10 ENCOUNTER — Other Ambulatory Visit: Payer: Self-pay | Admitting: Obstetrics and Gynecology

## 2022-12-10 DIAGNOSIS — N3281 Overactive bladder: Secondary | ICD-10-CM

## 2022-12-11 NOTE — Telephone Encounter (Signed)
Per OV notes from 09/10/2022--pt to f/u in 7 weeks for recheck. Pt was scheduled for 10/31/2022. However, cancelled due to not feeling well and has not cb to r/s.  Will send msg to appt desk to reach out and see if she would like to r/s.

## 2022-12-11 NOTE — Telephone Encounter (Signed)
Melton Alar, CMA Left message for patient to call and schedule appointment.

## 2022-12-13 ENCOUNTER — Encounter (INDEPENDENT_AMBULATORY_CARE_PROVIDER_SITE_OTHER): Payer: Self-pay | Admitting: Gastroenterology

## 2022-12-13 ENCOUNTER — Ambulatory Visit (INDEPENDENT_AMBULATORY_CARE_PROVIDER_SITE_OTHER): Payer: 59 | Admitting: Gastroenterology

## 2022-12-13 VITALS — BP 134/70 | HR 71 | Temp 97.8°F | Ht 66.0 in | Wt 146.3 lb

## 2022-12-13 DIAGNOSIS — K219 Gastro-esophageal reflux disease without esophagitis: Secondary | ICD-10-CM

## 2022-12-13 DIAGNOSIS — K582 Mixed irritable bowel syndrome: Secondary | ICD-10-CM

## 2022-12-13 DIAGNOSIS — R197 Diarrhea, unspecified: Secondary | ICD-10-CM

## 2022-12-13 DIAGNOSIS — R1310 Dysphagia, unspecified: Secondary | ICD-10-CM

## 2022-12-13 MED ORDER — OMEPRAZOLE 40 MG PO CPDR: 40.0000 mg | DELAYED_RELEASE_CAPSULE | Freq: Two times a day (BID) | ORAL | 2 refills | Status: DC

## 2022-12-13 NOTE — Patient Instructions (Signed)
It was nice to meet you! I'm so sorry you are going through so much I have provided the behavioral health number for the office down the hall, please reach out to them regarding counseling, if they need a referral your PCP can do this for you.  [phone]   I am also providing the behavioral health walk in clinic, you can call them in case of a crisis or walk in to be seen  [phone], address [address] [employer] [place]  Please stop pantoprazole/protonix I have sent omeprazole 90m to your pharmacy Please take this twice daily Avoid greasy, spicy, fried, citrus foods, and be mindful that caffeine, carbonated drinks, chocolate and alcohol can increase reflux symptoms, Stay upright 2-3 hours after eating, prior to lying down and avoid eating late in the evenings. Try elevating the head of your bed  I am providing the TIF procedure brochure, this may be a good option for you once things calm down some.   I am providing the low FODMAP guide, this can be helpful in avoiding certain triggers of your diarrhea  As we talked about, stress can also play a huge role in this. You can use ibgard as needed for abdominal pain and cramping You can try benefiber 1T twice daily with a meal to help with constipation/diarrhea, make sure water intake is good  Follow up 3 months  It was a pleasure to see you today. I want to create trusting relationships with patients and provide genuine, compassionate, and quality care. I truly value your feedback! please be on the lookout for a survey regarding your visit with me today. I appreciate your input about our visit and your time in completing this!    Saleema Weppler L. CAlver Sorrow MSN, APRN, AGNP-C Adult-Gerontology Nurse Practitioner [facility]

## 2022-12-13 NOTE — Progress Notes (Signed)
Referring Provider: Cassandria Anger, MD Primary Care Physician:  Cassandria Anger, MD Primary GI Physician: Jenetta Downer   Chief Complaint  Patient presents with   Gastroesophageal Reflux    Follow up on GERD.    HPI:   Melody [name] is a 67 y.o. female with past medical history of anxiety, depression, fibromyalgia, GERD, hypothyroidism, respiratory MAI  Patient presenting today for follow up of GERD and diarrhea   Last seen November 2023, at that time feeling recurrent sensation of discomfort in her throat, which forces her to try to swallow. reports that she has felt very "full up to the chest". Endorsed frequent regurgitation of acid contents in her mouth, especially as she has been eating more overnight.   taking Protonix 40 mg qday compliantly for GERD. She states she has been on multiple PPIs, can only remember Prevacid as she was on this one for long time. Having recurrent episodes of abdominal pain in her lower abdomen, on and off for the last couple of months. Recently began having diarrhea.  Recommended EGD/Colonoscopy, increase PPI to BID, CBC, CMP, Celiac and TSH, C diff/GI path panel if diarrhea persisted.   Labs as above were all normal other than slightly elevated TSH, advised to follow with PCP. EGD/Colonoscopy as below, recommended low FODMAP and IBgard. Stool samples not completed as diarrhea resolved.  Present:  Patient states she has had a very rough time. Will never have another colonoscopy as she did not get any sleep due to the prep. Had issues with her BP being very elevated in November requiring a few ER visits. She did not have a good experience there, waited 8 hours during one visit. She is very tired of being sick and having issues. She is wearing a holter monitor which really bothers her. Notes that her house is full of mold, she has been told by PCP that she needs to remove herself from this but she does not have the means to.  She is still having  diarrhea intermittently. Notes that stools range from bristol stool scale 1-7. Has taken imodium one time over the past few months. Has IBgard but she was not sure what it was for so has not taken it. Cannot pinpoint any specific foods that tend to make her have worse diarrhea. Has some fecal urgency at times. Denies abdominal pain. She is unsure how may or how often she is having BMs. Water intake is good.   She is still having heartburn and dysphagia. She is unsure how often this occurs. She notes she can have issues with feeling any kind of food gets stuck in her throat. Can usually drink liquids and food will pass on down. Only has dysphagia with really large pills.  Feels GERD symptoms are occurring a few times per week with some heartburn and food regurgitation.  She does not take anything for her GERD besides protonix 83m BID. She denies alcohol use, avoids spicy food, caffeine chocolate. Tries not to eat late. Stays up a while after eating before laying down.   Last Colonoscopy:09/2022 - One 2 mm polyp in the transverse colon (lymph node)                           - Diverticulosis in the sigmoid colon, in the  descending colon, in the transverse colon and in                            the ascending colon. normal colon biopsied-normal                           - Non-bleeding internal hemorrhoids.  Last Endoscopy: 09/2022 - No endoscopic esophageal abnormality to explain                            patient's dysphagia. Esophagus dilated. Dilated.                           - 2 cm hiatal hernia.                           - Normal stomach. Biopsied-normal                            - Normal examined duodenum. Biopsied-normal   Recommendations:  Repeat TCS 10 years   Past Medical History:  Diagnosis Date   Anxiety    Bronchiectasis (HCC)    Chronic back pain    Chronic neck pain    Depression    bipolar- Dr [name]   Fibromyalgia    GERD (gastroesophageal reflux  disease)    Headache    Hypertension    Hypothyroidism    Low back pain    Dr Arnoldo Morale   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[facility])    Osteoarthritis    Pneumonia    Pulmonary nodule    Vertigo    Vitamin B12 deficiency     Past Surgical History:  Procedure Laterality Date   ABDOMINAL HYSTERECTOMY N/A 01/10/2016   Procedure: HYSTERECTOMY ABDOMINAL;  Surgeon: Nunzio Cobbs, MD;  Location: [facility];  Service: Gynecology;  Laterality: N/A;   ABDOMINAL SACROCOLPOPEXY N/A 01/10/2016   Procedure: ABDOMINO SACROCOLPOPEXY ;  Surgeon: Nunzio Cobbs, MD;  Location: [facility];  Service: Gynecology;  Laterality: N/A;   ANTERIOR AND POSTERIOR REPAIR N/A 01/10/2016   Procedure:  POSTERIOR REPAIR (RECTOCELE);  Surgeon: Nunzio Cobbs, MD;  Location: [facility];  Service: Gynecology;  Laterality: N/A;   BIOPSY  10/19/2022   Procedure: BIOPSY;  Surgeon: Harvel Quale, MD;  [facility];  Service: Gastroenterology;;   BLADDER SUSPENSION N/A 01/10/2016   Procedure: TRANSVAGINAL TAPE (TVT) PROCEDURE exact midurethral sling;  Surgeon: Nunzio Cobbs, MD;  Location: [facility];  Service: Gynecology;  Laterality: N/A;   CERVICAL LAMINECTOMY  2001 and 2995   Dr Arnoldo Morale   COLONOSCOPY WITH PROPOFOL N/A 10/19/2022   Procedure: COLONOSCOPY WITH PROPOFOL;  Surgeon: Harvel Quale, MD;  [facility];  Service: Gastroenterology;  Laterality: N/A;  10:30am, asa 1-2   CYSTO N/A 01/10/2016   Procedure: CYSTOSCOPY;  Surgeon: Nunzio Cobbs, MD;  Location: [facility];  Service: Gynecology;  Laterality: N/A;   ESOPHAGEAL DILATION N/A 10/19/2022   Procedure: ESOPHAGEAL DILATION;  Surgeon: Harvel Quale, MD;  [facility];  Service: Gastroenterology;  Laterality: N/A;   ESOPHAGOGASTRODUODENOSCOPY (EGD) WITH PROPOFOL N/A 10/19/2022   Procedure: ESOPHAGOGASTRODUODENOSCOPY (EGD) WITH PROPOFOL;  Surgeon: Harvel Quale, MD;  [facility];  Service: Gastroenterology;  Laterality: N/A;   POLYPECTOMY  10/19/2022   Procedure: POLYPECTOMY INTESTINAL;  Surgeon: Harvel Quale, MD;  [facility];  Service: Gastroenterology;;   SALPINGOOPHORECTOMY Bilateral 01/10/2016   Procedure: BILATERAL SALPINGO OOPHORECTOMY;  Surgeon: Nunzio Cobbs, MD;  Location: [facility];  Service: Gynecology;  Laterality: Bilateral;   SAVORY DILATION  10/19/2022   Procedure: SAVORY DILATION;  Surgeon: Montez Morita, Quillian Quince, MD;  [facility];  Service: Gastroenterology;;   TUBAL LIGATION  1980    Current Outpatient Medications  Medication Sig Dispense Refill   albuterol (VENTOLIN HFA) 108 (90 Base) MCG/ACT inhaler INHALE TWO PUFFS BY MOUTH EVERY 4 HOURS AS NEEDED FOR wheezing OR SHORTNESS OF BREATH 8.5 g 5   ALPRAZolam (XANAX) 1 MG tablet Take 1 mg by mouth 3 (three) times daily.     Cholecalciferol (VITAMIN D3) 1.25 MG (50000 UT) CAPS TAKE ONE CAPSULE BY MOUTH EVERY 14 DAYS 6 capsule 3   cyanocobalamin (VITAMIN B12) 1000 MCG/ML injection INJECT 1ML INTO THE SKIN EVERY 14 DAYS 6 mL 3   diclofenac Sodium (VOLTAREN) 1 % GEL APPLY TWO GRAMS TO AFFECTED AREA(S) FOUR TIMES DAILY (Patient taking differently: Apply 2 g topically 4 (four) times daily as needed (shoulder pain).) 200 g 3   fluticasone (FLONASE) 50 MCG/ACT nasal spray INSTILL TWO SPRAYS IN EACH NOSTRIL DAILY 48 g 3   levocetirizine (XYZAL) 5 MG tablet TAKE 1 TABLET BY MOUTH EVERY EVENING (FOR ALLERGY) (Patient taking differently: Take 5 mg by mouth daily as needed for allergies.) 90 tablet 3   levothyroxine (SYNTHROID) 50 MCG tablet Take 1 tablet (50 mcg total) by mouth daily before breakfast. 90 tablet 3   lidocaine (LIDODERM) 5 % Place 1-2 patches onto the skin daily. Remove & Discard patch within 12 hours or as directed by MD (Patient taking differently: Place 1-2 patches onto the skin daily as needed (pain). Remove &  Discard patch within 12 hours or as directed by MD) 60 patch 3   losartan (COZAAR) 25 MG tablet Take 1 tablet (25 mg total) by mouth daily. Follow-up appt due in Feb must see provider for future refills 30 tablet 2   Magnesium Oxide 400 MG CAPS Take 1 capsule (400 mg total) by mouth daily. Must keep 12/17/22 appt for future refills 10 capsule 0   methocarbamol (ROBAXIN) 500 MG tablet Take 1 tablet (500 mg total) by mouth 3 (three) times daily as needed for muscle spasms. 30 tablet 0   methylphenidate (RITALIN) 20 MG tablet Take 1 tablet (20 mg total) by mouth daily.  0   oxybutynin (DITROPAN-XL) 10 MG 24 hr tablet Take 1 tablet (10 mg total) by mouth at bedtime. Needs office visit for any additional refills. 30 tablet 0   pantoprazole (PROTONIX) 40 MG tablet TAKE 1 TABLET BY MOUTH TWICE DAILY 180 tablet 3   potassium chloride SA (KLOR-CON M) 20 MEQ tablet Take 1 tablet (20 mEq total) by mouth daily. Must keep 12/17/22 appt for future refills 10 tablet 0   promethazine (PHENERGAN) 25 MG tablet TAKE 1/2 TO 1 TABLET BY MOUTH EVERY 8 HOURS AS NEEDED FOR NAUSEA AND VOMITING 20 tablet 1   QUEtiapine (SEROQUEL) 25 MG tablet Take 25 mg by mouth at bedtime.     rizatriptan (MAXALT) 10 MG tablet Take 1 tablet (10 mg total) by mouth once as needed for up to 1 dose for migraine. May repeat in 2 hours  if needed 12 tablet 5   Tiotropium Bromide Monohydrate (SPIRIVA RESPIMAT) 2.5 MCG/ACT AERS Inhale 1 each into the lungs daily.     triamcinolone ointment (KENALOG) 0.1 % Apply topically 2 (two) times daily as needed. to affected area(s) 80 g 3   No current facility-administered medications for this visit.    Allergies as of 12/13/2022 - Review Complete 12/13/2022  Allergen Reaction Noted   Flagyl [metronidazole] Shortness Of Breath 08/06/2018   Doxycycline hyclate Itching    Dulera [mometasone furo-formoterol fum]  08/29/2022   Gabapentin  11/12/2017   Nabumetone Other (See Comments) 09/01/2010   Sulfa  antibiotics Other (See Comments) 11/08/2013   Tetracycline hcl Itching    Tramadol hcl Other (See Comments)     Family History  Problem Relation Age of Onset   Heart attack Father        dec heart disease age 76   Hypertension Father    Lung disease Father    Cancer Mother        dec stomach ca--age 34   Breast cancer Cousin    Arthritis Other    Breast cancer Paternal Aunt     Social History   Socioeconomic History   Marital status: Legally Separated    Spouse name: Not on file   Number of children: Not on file   Years of education: Not on file   Highest education level: Not on file  Occupational History   Not on file  Tobacco Use   Smoking status: Former    Packs/day: 1.00    Years: 35.00    Total pack years: 35.00    Types: Cigarettes    Start date: 11/21/1962    Quit date: 05/06/2011    Years since quitting: 11.6   Smokeless tobacco: Never   Tobacco comments:    stopped periodically - peak rate of 1 ppd  Vaping Use   Vaping Use: Never used  Substance and Sexual Activity   Alcohol use: No    Alcohol/week: 0.0 standard drinks of alcohol   Drug use: No   Sexual activity: Not Currently    Birth control/protection: Post-menopausal, Surgical    Comment: Tubal/Hyst/BSO  Other Topics Concern   Not on file  Social History Narrative   [facility] (04/04/17):   Originally from [place], [place]. 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 Recently had a persian cat that passed. No bird exposure. She denies any indoor plants currently. She does use wood burning for heat. She reports there is water getting under her foundation. She has mold in her home that she reports is in the 2nd floor of her home. She has recently started seeing mold under her bed. She reports her home smells musty & damp. She has always lived in [place].    Social Determinants of Health   Financial Resource Strain: High Risk (12/25/2021)   Overall Financial  Resource Strain (CARDIA)    Difficulty of Paying Living Expenses: Very hard  Food Insecurity: Food Insecurity Present (12/25/2021)   Hunger Vital Sign    Worried About Running Out of Food in the Last Year: Often true    Ran Out of Food in the Last Year: Often true  Transportation Needs: No Transportation Needs (12/25/2021)   PRAPARE - Hydrologist (Medical): No    Lack of Transportation (Non-Medical): No  Physical Activity: Inactive (12/25/2021)   Exercise Vital Sign  Days of Exercise per Week: 0 days    Minutes of Exercise per Session: 0 min  Stress: Stress Concern Present (12/25/2021)   [place]    Feeling of Stress : Very much  Social Connections: Socially Isolated (12/25/2021)   Social Connection and Isolation Panel [NHANES]    Frequency of Communication with Friends and Family: More than three times a week    Frequency of Social Gatherings with Friends and Family: Never    Attends Religious Services: Never    Marine scientist or Organizations: No    Attends Music therapist: Never    Marital Status: Separated    Review of systems General: negative for malaise, night sweats, fever, chills, weight loss Neck: Negative for lumps, goiter, pain and significant neck swelling Resp: Negative for cough, wheezing, dyspnea at rest CV: Negative for chest pain, leg swelling, palpitations, orthopnea GI: denies melena, hematochezia, nausea,  odyonophagia, early satiety or unintentional weight loss. +diarrhea +constipation +GERD symptoms +dysphagia MSK: Negative for joint pain or swelling, back pain, and muscle pain. Derm: Negative for itching or rash Psych: Denies depression, anxiety, memory loss, confusion. No homicidal or suicidal ideation.  Heme: Negative for prolonged bleeding, bruising easily, and swollen nodes. Endocrine: Negative for cold or heat intolerance, polyuria,  polydipsia and goiter. Neuro: negative for tremor, gait imbalance, syncope and seizures. The remainder of the review of systems is noncontributory.  Physical Exam: BP 134/70 (BP Location: Left Arm, Patient Position: Sitting, Cuff Size: Normal)   Pulse 71   Temp 97.8 F (36.6 C) (Oral)   Ht 5' 6"$  (1.676 m)   Wt 146 lb 4.8 oz (66.4 kg)   LMP 10/29/2006 (Approximate)   BMI 23.61 kg/m  General:   Alert and oriented. No distress noted. cooperative. tearful Head:  Normocephalic and atraumatic. Eyes:  Conjuctiva clear without scleral icterus. Mouth:  Oral mucosa pink and moist. Good dentition. No lesions. Heart: Normal rate and rhythm, s1 and s2 heart sounds present.  Lungs: Clear lung sounds in all lobes. Respirations equal and unlabored. Abdomen:  +BS, soft, non-tender and non-distended. No rebound or guarding. No HSM or masses noted. Derm: No palmar erythema or jaundice Msk:  Symmetrical without gross deformities. Normal posture. Extremities:  Without edema. Neurologic:  Alert and  oriented x4 Psych:  Alert and cooperative. Tearful, irritable.   Invalid input(s): "6 MONTHS"   ASSESSMENT: Melody [name] is a 67 y.o. female presenting today for follow up of GERD/dysphagia and diarrhea.  GERD/dysphagia:continue to have frequent GERD symptoms and dysphagia despite recent EGD with dilation. She has been on protonix for a while. Recommend switching to omeprazole 70m BID. Given GERD symptoms have been refractory, recommend further discussion of TIF procedure, though she would need to undergo esophageal manometry which would also help to assess for her ongoing dysphagia despite no anatomical findings on EGD to explain symptoms. Patient is interested in TIF, however, she has some other health issues going on right now so she would like to read over brochure and let me know once she is ready to proceed. For now should practice strict reflux precautions to include avoiding greasy, spicy, fried,  citrus foods, and be mindful that caffeine, carbonated drinks, chocolate and alcohol can increase reflux symptoms, Stay upright 2-3 hours after eating, prior to lying down and avoid eating late in the evenings, as well as chewing precautions with taking small bites, chewing thoroughly, taking sips of liquids between bites and  avoiding thicker, drier foods.   Diarrhea and constipation: stools range from 1-7 on bristol stool scale. Cannot seem to pinpoint any precipitating factors to diarrhea. Has only taken imodium once recently. No rectal bleeding or melena.  I explained indications of IBS with the patient to include hypersensitivity of the bowels that is often heavily influenced by certain food/drink triggers and emotional/mental triggers. We discussed the importance of keeping a food journal x2-3 weeks and utilizing the low FODMAP guide to help determine specific trigger foods and increasing foods that tend to be more well tolerated. Also discussed good stress management. I recommend she start benefiber 1T BID with meals and make sure water intake is good.   Notably, patient visibly upset on arrival for her visit today. She is undergoing multiple health issues right now as well as some social/living situation stressors. Patient denies any suicidal ideations. I spoke with the patient at length about the importance of a good support system and that counseling can be beneficial especially in times of high life stress. I did provide the [facility] number for her as well as contact info for the Behavioral health walk in clinic/crisis line. I encouraged her to reach out to friends/family or even myself if she does develop any thoughts of wanting to harm herself, though at this time I do not feel she is a danger to herself.    PLAN:  Start omeprazole 57m BID 2. Stop protonix  3. Consider TIF procedure (will need esophageal manometry as well) 4. Low FODMAP  5. Good water intake 6. Try benefiber 1T BID  with meals 7. [facility] 8. Good stress management  All questions were answered, patient verbalized understanding and is in agreement with plan as outlined above.   Follow Up: 3 months   Anaia Frith L. CAlver Sorrow MSN, APRN, AGNP-C Adult-Gerontology Nurse Practitioner [facility]  I have reviewed the note and agree with the APP's assessment as described in this progress note  DMaylon Peppers MD Gastroenterology and Hepatology [facility]

## 2022-12-14 ENCOUNTER — Telehealth: Payer: Self-pay | Admitting: Internal Medicine

## 2022-12-14 MED ORDER — GUAIFENESIN-DM 100-10 MG/5ML PO SYRP: 5.0000 mL | ORAL_SOLUTION | ORAL | 0 refills | Status: DC | PRN

## 2022-12-14 NOTE — Telephone Encounter (Signed)
Ok for robitussin DM -= done erx 

## 2022-12-14 NOTE — Telephone Encounter (Signed)
Please advise in PCP absence.  

## 2022-12-14 NOTE — Telephone Encounter (Signed)
Pt called stated she need a medication sent in for coughing and congestion.  Please call pt back with update

## 2022-12-16 DIAGNOSIS — K582 Mixed irritable bowel syndrome: Secondary | ICD-10-CM | POA: Insufficient documentation

## 2022-12-17 ENCOUNTER — Other Ambulatory Visit: Payer: Self-pay | Admitting: Internal Medicine

## 2022-12-17 ENCOUNTER — Ambulatory Visit (INDEPENDENT_AMBULATORY_CARE_PROVIDER_SITE_OTHER): Payer: 59 | Admitting: Internal Medicine

## 2022-12-17 ENCOUNTER — Encounter: Payer: Self-pay | Admitting: Internal Medicine

## 2022-12-17 ENCOUNTER — Telehealth: Payer: Self-pay | Admitting: Internal Medicine

## 2022-12-17 VITALS — BP 118/70 | HR 76 | Temp 97.7°F | Ht 66.0 in | Wt 145.0 lb

## 2022-12-17 DIAGNOSIS — F419 Anxiety disorder, unspecified: Secondary | ICD-10-CM

## 2022-12-17 DIAGNOSIS — E876 Hypokalemia: Secondary | ICD-10-CM | POA: Diagnosis not present

## 2022-12-17 DIAGNOSIS — G8929 Other chronic pain: Secondary | ICD-10-CM | POA: Diagnosis not present

## 2022-12-17 DIAGNOSIS — M5440 Lumbago with sciatica, unspecified side: Secondary | ICD-10-CM

## 2022-12-17 DIAGNOSIS — E034 Atrophy of thyroid (acquired): Secondary | ICD-10-CM

## 2022-12-17 DIAGNOSIS — E538 Deficiency of other specified B group vitamins: Secondary | ICD-10-CM

## 2022-12-17 LAB — COMPREHENSIVE METABOLIC PANEL
ALT: 9 U/L (ref 0–35)
AST: 12 U/L (ref 0–37)
Albumin: 4.2 g/dL (ref 3.5–5.2)
Alkaline Phosphatase: 74 U/L (ref 39–117)
BUN: 23 mg/dL (ref 6–23)
CO2: 29 mEq/L (ref 19–32)
Calcium: 9.6 mg/dL (ref 8.4–10.5)
Chloride: 105 mEq/L (ref 96–112)
Creatinine, Ser: 0.95 mg/dL (ref 0.40–1.20)
GFR: 61.75 mL/min (ref >60.00)
Glucose, Bld: 69 mg/dL — ABNORMAL LOW (ref 70–99)
Potassium: 4.5 mEq/L (ref 3.5–5.1)
Sodium: 140 mEq/L (ref 135–145)
Total Bilirubin: 0.6 mg/dL (ref 0.2–1.2)
Total Protein: 7.1 g/dL (ref 6.0–8.3)

## 2022-12-17 LAB — MAGNESIUM: Magnesium: 2.2 mg/dL (ref 1.5–2.5)

## 2022-12-17 MED ORDER — OXYCODONE-ACETAMINOPHEN 5-325 MG PO TABS: ORAL_TABLET | ORAL | 0 refills | Status: DC

## 2022-12-17 NOTE — Assessment & Plan Note (Signed)
On B12 q 2 weeks

## 2022-12-17 NOTE — Progress Notes (Signed)
Subjective:  Patient ID: Melody [name], female    DOB: Jul 20, 1956  Age: 67 y.o. MRN: [id number]  CC: No chief complaint on file.   HPI Melody [name] presents for stress, depression, B12 def, low K C/o LBP, GERD sx's  Outpatient Medications Prior to Visit  Medication Sig Dispense Refill   albuterol (VENTOLIN HFA) 108 (90 Base) MCG/ACT inhaler INHALE TWO PUFFS BY MOUTH EVERY 4 HOURS AS NEEDED FOR wheezing OR SHORTNESS OF BREATH 8.5 g 5   ALPRAZolam (XANAX) 1 MG tablet Take 1 mg by mouth 3 (three) times daily.     Cholecalciferol (VITAMIN D3) 1.25 MG (50000 UT) CAPS TAKE ONE CAPSULE BY MOUTH EVERY 14 DAYS 6 capsule 3   cyanocobalamin (VITAMIN B12) 1000 MCG/ML injection INJECT 1ML INTO THE SKIN EVERY 14 DAYS 6 mL 3   diclofenac Sodium (VOLTAREN) 1 % GEL APPLY TWO GRAMS TO AFFECTED AREA(S) FOUR TIMES DAILY (Patient taking differently: Apply 2 g topically 4 (four) times daily as needed (shoulder pain).) 200 g 3   fluticasone (FLONASE) 50 MCG/ACT nasal spray INSTILL TWO SPRAYS IN EACH NOSTRIL DAILY 48 g 3   levocetirizine (XYZAL) 5 MG tablet TAKE 1 TABLET BY MOUTH EVERY EVENING (FOR ALLERGY) (Patient taking differently: Take 5 mg by mouth daily as needed for allergies.) 90 tablet 3   levothyroxine (SYNTHROID) 50 MCG tablet Take 1 tablet (50 mcg total) by mouth daily before breakfast. 90 tablet 3   lidocaine (LIDODERM) 5 % Place 1-2 patches onto the skin daily. Remove & Discard patch within 12 hours or as directed by MD (Patient taking differently: Place 1-2 patches onto the skin daily as needed (pain). Remove & Discard patch within 12 hours or as directed by MD) 60 patch 3   losartan (COZAAR) 25 MG tablet Take 1 tablet (25 mg total) by mouth daily. Follow-up appt due in Feb must see provider for future refills 30 tablet 2   Magnesium Oxide 400 MG CAPS Take 1 capsule (400 mg total) by mouth daily. Must keep 12/17/22 appt for future refills 10 capsule 0   methocarbamol (ROBAXIN) 500 MG tablet Take 1  tablet (500 mg total) by mouth 3 (three) times daily as needed for muscle spasms. 30 tablet 0   methylphenidate (RITALIN) 20 MG tablet Take 1 tablet (20 mg total) by mouth daily.  0   omeprazole (PRILOSEC) 40 MG capsule Take 1 capsule (40 mg total) by mouth 2 (two) times daily. 60 capsule 2   oxybutynin (DITROPAN-XL) 10 MG 24 hr tablet Take 1 tablet (10 mg total) by mouth at bedtime. Needs office visit for any additional refills. 30 tablet 0   potassium chloride SA (KLOR-CON M) 20 MEQ tablet Take 1 tablet (20 mEq total) by mouth daily. Must keep 12/17/22 appt for future refills 10 tablet 0   promethazine (PHENERGAN) 25 MG tablet TAKE 1/2 TO 1 TABLET BY MOUTH EVERY 8 HOURS AS NEEDED FOR NAUSEA AND VOMITING 20 tablet 1   QUEtiapine (SEROQUEL) 25 MG tablet Take 25 mg by mouth at bedtime.     rizatriptan (MAXALT) 10 MG tablet Take 1 tablet (10 mg total) by mouth once as needed for up to 1 dose for migraine. May repeat in 2 hours if needed 12 tablet 5   Tiotropium Bromide Monohydrate (SPIRIVA RESPIMAT) 2.5 MCG/ACT AERS Inhale 1 each into the lungs daily.     triamcinolone ointment (KENALOG) 0.1 % Apply topically 2 (two) times daily as needed. to affected area(s) 80 g  3   oxyCODONE-acetaminophen (PERCOCET/ROXICET) 5-325 MG tablet TAKE 1 TABLET BY MOUTH EVERY 8 HOURS AS NEEDED FOR SEVERE PAIN     guaiFENesin-dextromethorphan (ROBITUSSIN DM) 100-10 MG/5ML syrup Take 5 mLs by mouth every 4 (four) hours as needed for cough. (Patient not taking: Reported on 12/17/2022) 118 mL 0   No facility-administered medications prior to visit.    ROS: Review of Systems  Constitutional:  Positive for fatigue. Negative for activity change, appetite change, chills and unexpected weight change.  HENT:  Negative for congestion, mouth sores and sinus pressure.   Eyes:  Negative for visual disturbance.  Respiratory:  Negative for cough and chest tightness.   Gastrointestinal:  Negative for abdominal pain and nausea.   Genitourinary:  Negative for difficulty urinating, frequency and vaginal pain.  Musculoskeletal:  Negative for back pain and gait problem.  Skin:  Negative for pallor and rash.  Neurological:  Negative for dizziness, tremors, weakness, numbness and headaches.  Hematological:  Does not bruise/bleed easily.  Psychiatric/Behavioral:  Positive for decreased concentration and dysphoric mood. Negative for confusion, sleep disturbance and suicidal ideas. The patient is nervous/anxious.     Objective:  BP 118/70 (BP Location: Left Arm, Patient Position: Sitting, Cuff Size: Normal)   Pulse 76   Temp 97.7 F (36.5 C)   Ht 5' 6"$  (1.676 m)   Wt 145 lb (65.8 kg)   LMP 10/29/2006 (Approximate)   SpO2 96%   BMI 23.40 kg/m   BP Readings from Last 3 Encounters:  12/17/22 118/70  12/13/22 134/70  12/01/22 (!) 164/69    Wt Readings from Last 3 Encounters:  12/17/22 145 lb (65.8 kg)  12/13/22 146 lb 4.8 oz (66.4 kg)  11/30/22 146 lb (66.2 kg)    Physical Exam Constitutional:      General: She is not in acute distress.    Appearance: Normal appearance. She is well-developed.  HENT:     Head: Normocephalic.     Right Ear: External ear normal.     Left Ear: External ear normal.     Nose: Nose normal.  Eyes:     General:        Right eye: No discharge.        Left eye: No discharge.     Conjunctiva/sclera: Conjunctivae normal.     Pupils: Pupils are equal, round, and reactive to [place].  Neck:     Thyroid: No thyromegaly.     Vascular: No JVD.     Trachea: No tracheal deviation.  Cardiovascular:     Rate and Rhythm: Normal rate and regular rhythm.     Heart sounds: Normal heart sounds.  Pulmonary:     Effort: No respiratory distress.     Breath sounds: No stridor. No wheezing.  Abdominal:     General: Bowel sounds are normal. There is no distension.     Palpations: Abdomen is soft. There is no mass.     Tenderness: There is no abdominal tenderness. There is no guarding or  rebound.  Musculoskeletal:        General: No tenderness.     Cervical back: Normal range of motion and neck supple. No rigidity.  Lymphadenopathy:     Cervical: No cervical adenopathy.  Skin:    Findings: No erythema or rash.  Neurological:     Cranial Nerves: No cranial nerve deficit.     Motor: No abnormal muscle tone.     Deep Tendon Reflexes: Reflexes normal.  Psychiatric:  Behavior: Behavior normal.        Thought Content: Thought content normal.        Judgment: Judgment normal.   tearful  Lab Results  Component Value Date   WBC 6.6 11/30/2022   HGB 13.0 11/30/2022   HCT 38.6 11/30/2022   PLT 176 11/30/2022   GLUCOSE 116 (H) 11/30/2022   CHOL 197 05/28/2022   TRIG 86.0 05/28/2022   HDL 59.90 05/28/2022   LDLCALC 120 (H) 05/28/2022   ALT 20 09/19/2022   AST 19 09/19/2022   NA 138 11/30/2022   K 3.2 (L) 11/30/2022   CL 105 11/30/2022   CREATININE 0.89 11/30/2022   BUN 18 11/30/2022   CO2 26 11/30/2022   TSH 4.73 (H) 09/19/2022    DG Chest 2 View  Result Date: 11/30/2022 CLINICAL DATA:  Palpitations. EXAM: CHEST - 2 VIEW COMPARISON:  09/23/2022. FINDINGS: The heart size and mediastinal contours are within normal limits. Scattered nodular opacities are noted in the lungs bilaterally, similar in appearance to the prior exam. No consolidation, effusion, or pneumothorax. Cervical spinal fusion hardware is present. No acute osseous abnormality. IMPRESSION: Stable nodular opacities in the lungs bilaterally, possibly associated with atypical infection and unchanged from the prior exam. Electronically Signed   By: Brett Fairy M.D.   On: 11/30/2022 20:56    Assessment & Plan:   Problem List Items Addressed This Visit       Endocrine   Hypothyroidism - Primary (Chronic)    Chronic On Levothroid      Relevant Orders   Comprehensive metabolic panel   Magnesium     Other   Low back pain    Recurrent Percocet prn  Potential benefits of a long term opioids  use as well as potential risks (i.e. addiction risk, apnea etc) and complications (i.e. Somnolence, constipation and others) were explained to the patient and were aknowledged.  Potential benefits of a long term NSAID  use as well as potential risks  and complications were explained to the patient and were aknowledged.      Relevant Medications   oxyCODONE-acetaminophen (PERCOCET/ROXICET) 5-325 MG tablet   Hypokalemia    Worse On KCl, magnesium Check CMET and Mg      Relevant Orders   Comprehensive metabolic panel   Magnesium   B12 deficiency    On B12 q 2 weeks      Anxiety disorder    F/u w/Dr [name] on Seroquel, Xanax prn         Meds ordered this encounter  Medications   oxyCODONE-acetaminophen (PERCOCET/ROXICET) 5-325 MG tablet    Sig: TAKE 1 TABLET BY MOUTH EVERY 8 HOURS AS NEEDED FOR SEVERE PAIN    Dispense:  60 tablet    Refill:  0      Follow-up: Return in about 3 months (around 03/17/2023) for a follow-up visit.  Walker Kehr, MD

## 2022-12-17 NOTE — Assessment & Plan Note (Signed)
Worse On KCl, magnesium Check CMET and Mg

## 2022-12-17 NOTE — Assessment & Plan Note (Signed)
Chronic On Levothroid - 

## 2022-12-17 NOTE — Telephone Encounter (Signed)
Pt called having question about her medication. Pt had appt today forgot to ask about her Rx's and what she need or stop taking.  Please call pt back with update

## 2022-12-17 NOTE — Addendum Note (Signed)
Addended by: Harvel Quale on: 12/17/2022 01:15 PM   Modules accepted: Level of Service

## 2022-12-17 NOTE — Assessment & Plan Note (Signed)
F/u w/Dr [name] Cont on Seroquel, Xanax prn

## 2022-12-17 NOTE — Assessment & Plan Note (Signed)
Recurrent Percocet prn  Potential benefits of a long term opioids use as well as potential risks (i.e. addiction risk, apnea etc) and complications (i.e. Somnolence, constipation and others) were explained to the patient and were aknowledged.  Potential benefits of a long term NSAID  use as well as potential risks  and complications were explained to the patient and were aknowledged.

## 2022-12-17 NOTE — Telephone Encounter (Signed)
Tillie Rung D, CMA [facility]/Two messages were left for patient to call and schedule appointment; patient did not return our call.  Please advise. Rx pend.

## 2022-12-17 NOTE — Telephone Encounter (Signed)
LM for pt advising medication was sent in Friday

## 2022-12-18 DIAGNOSIS — R002 Palpitations: Secondary | ICD-10-CM | POA: Diagnosis not present

## 2022-12-18 MED ORDER — LOSARTAN POTASSIUM 25 MG PO TABS: 25.0000 mg | ORAL_TABLET | Freq: Every day | ORAL | 3 refills | Status: DC

## 2022-12-18 NOTE — Telephone Encounter (Signed)
Called pt she states she didn't know if MD wanted her to continue taking the magnesium, potassium and her BP med. Inform pt yes.. per chart refills was sent back on magnesium and potassium. Pt states she need her Losartan refill too. Inform pt sent rx to  [facility].Marland KitchenJohny Chess

## 2022-12-19 ENCOUNTER — Telehealth: Payer: Self-pay | Admitting: Internal Medicine

## 2022-12-19 NOTE — Telephone Encounter (Signed)
Please advise, pt requesting Zpack

## 2022-12-19 NOTE — Telephone Encounter (Signed)
Patient was seen yesterday - she would like to know if Dr. Alain Marion will send her in a Zpack - he sent cough syrup but she feels like she needs an antibiotic.  Pharmacy:  [facility]  Phone:  [phone]

## 2022-12-20 MED ORDER — AZITHROMYCIN 250 MG PO TABS: ORAL_TABLET | ORAL | 0 refills | Status: AC

## 2022-12-20 NOTE — Telephone Encounter (Signed)
Okay.  Thanks.

## 2022-12-20 NOTE — Telephone Encounter (Signed)
Called pt and informed medication was sent in, pt verbalized understanding

## 2023-01-25 ENCOUNTER — Other Ambulatory Visit (HOSPITAL_COMMUNITY): Payer: Self-pay

## 2023-01-25 ENCOUNTER — Telehealth: Payer: Self-pay

## 2023-01-25 NOTE — Telephone Encounter (Signed)
Patient Advocate Encounter  Prior Authorization for oxyCODONE-Acetaminophen 5-325MG  tablets has been approved through [employer] Part D.  Key: [id number]    Effective: 01-25-2023 to 21-31-2024

## 2023-01-25 NOTE — Telephone Encounter (Signed)
Patient Advocate Encounter   Received notification from [employer] Part D that prior authorization is required for oxyCODONE-Acetaminophen 5-325MG  tablets  Submitted: 01-25-2023 Key [id number]  Status is pending

## 2023-01-28 ENCOUNTER — Telehealth: Payer: Self-pay | Admitting: Internal Medicine

## 2023-01-28 NOTE — Telephone Encounter (Signed)
Patient called and said she thinks she has a UTI. She would like to know if PCP can send in medication to help. When advised to make an appointment, she declined and asked for him to send medication in to [facility] [place] . Best callback is [phone].

## 2023-01-30 MED ORDER — CEPHALEXIN 500 MG PO CAPS: 1000.0000 mg | ORAL_CAPSULE | Freq: Two times a day (BID) | ORAL | 0 refills | Status: DC

## 2023-01-30 NOTE — Telephone Encounter (Signed)
Okay Keflex.  Thanks 

## 2023-01-30 NOTE — Telephone Encounter (Signed)
Notified pt MD sent antibiotic to [facility].../lmb 

## 2023-03-05 ENCOUNTER — Other Ambulatory Visit: Payer: Self-pay | Admitting: Internal Medicine

## 2023-03-05 ENCOUNTER — Other Ambulatory Visit (INDEPENDENT_AMBULATORY_CARE_PROVIDER_SITE_OTHER): Payer: Self-pay | Admitting: Gastroenterology

## 2023-03-05 DIAGNOSIS — E034 Atrophy of thyroid (acquired): Secondary | ICD-10-CM

## 2023-03-14 ENCOUNTER — Other Ambulatory Visit: Payer: Self-pay | Admitting: Internal Medicine

## 2023-03-14 DIAGNOSIS — N3281 Overactive bladder: Secondary | ICD-10-CM

## 2023-03-18 ENCOUNTER — Encounter: Payer: Self-pay | Admitting: Internal Medicine

## 2023-03-18 ENCOUNTER — Ambulatory Visit (INDEPENDENT_AMBULATORY_CARE_PROVIDER_SITE_OTHER): Payer: 59 | Admitting: Internal Medicine

## 2023-03-18 VITALS — BP 120/82 | HR 82 | Temp 98.6°F | Ht 66.0 in | Wt 135.0 lb

## 2023-03-18 DIAGNOSIS — H9193 Unspecified hearing loss, bilateral: Secondary | ICD-10-CM | POA: Diagnosis not present

## 2023-03-18 DIAGNOSIS — R42 Dizziness and giddiness: Secondary | ICD-10-CM | POA: Diagnosis not present

## 2023-03-18 DIAGNOSIS — F419 Anxiety disorder, unspecified: Secondary | ICD-10-CM

## 2023-03-18 DIAGNOSIS — E538 Deficiency of other specified B group vitamins: Secondary | ICD-10-CM

## 2023-03-18 DIAGNOSIS — E559 Vitamin D deficiency, unspecified: Secondary | ICD-10-CM

## 2023-03-18 DIAGNOSIS — E034 Atrophy of thyroid (acquired): Secondary | ICD-10-CM | POA: Diagnosis not present

## 2023-03-18 NOTE — Assessment & Plan Note (Signed)
Continue with vitamin D 

## 2023-03-18 NOTE — Assessment & Plan Note (Signed)
Chronic On Levothroid - 

## 2023-03-18 NOTE — Patient Instructions (Signed)
Stop Losartan if you continue getting lightheaded 

## 2023-03-18 NOTE — Assessment & Plan Note (Signed)
On B12 q 2 weeks 

## 2023-03-18 NOTE — Assessment & Plan Note (Signed)
Stop Losartan if you continue getting lightheaded

## 2023-03-18 NOTE — Assessment & Plan Note (Signed)
F/u w/Dr Kaur Cont on Seroquel, Xanax prn 

## 2023-03-18 NOTE — Assessment & Plan Note (Addendum)
No wax Referral to ENT was offered

## 2023-03-18 NOTE — Progress Notes (Signed)
Subjective:  Patient ID: Melody [name], female    DOB: 1956/01/28  Age: 67 y.o. MRN: [id number]  CC: Follow-up (3 MNTH F/U, PELVIC PAIN)   HPI Melody [name] presents for anxiety, depression, hypothyroidism C/o poor hearing  Outpatient Medications Prior to Visit  Medication Sig Dispense Refill   albuterol (VENTOLIN HFA) 108 (90 Base) MCG/ACT inhaler INHALE TWO PUFFS BY MOUTH EVERY 4 HOURS AS NEEDED FOR wheezing OR SHORTNESS OF BREATH 8.5 g 5   ALPRAZolam (XANAX) 1 MG tablet Take 1 mg by mouth 3 (three) times daily.     cephALEXin (KEFLEX) 500 MG capsule Take 2 capsules (1,000 mg total) by mouth 2 (two) times daily. 20 capsule 0   Cholecalciferol (VITAMIN D3) 1.25 MG (50000 UT) CAPS TAKE ONE CAPSULE BY MOUTH EVERY 14 DAYS 6 capsule 3   cyanocobalamin (VITAMIN B12) 1000 MCG/ML injection INJECT INTO THE SKIN EVERY 14 DAYS 6 mL 3   diclofenac Sodium (VOLTAREN) 1 % GEL APPLY TWO GRAMS TO AFFECTED AREA(S) FOUR TIMES DAILY (Patient taking differently: Apply 2 g topically 4 (four) times daily as needed (shoulder pain).) 200 g 3   fluticasone (FLONASE) 50 MCG/ACT nasal spray INSTILL TWO SPRAYS IN EACH NOSTRIL DAILY 48 g 3   guaiFENesin-dextromethorphan (ROBITUSSIN DM) 100-10 MG/5ML syrup Take 5 mLs by mouth every 4 (four) hours as needed for cough. 118 mL 0   levocetirizine (XYZAL) 5 MG tablet TAKE 1 TABLET BY MOUTH EVERY EVENING (FOR ALLERGY) (Patient taking differently: Take 5 mg by mouth daily as needed for allergies.) 90 tablet 3   levothyroxine (SYNTHROID) 50 MCG tablet TAKE 1 TABLET BY MOUTH DAILY BEFORE BREAKFAST 90 tablet 2   lidocaine (LIDODERM) 5 % Place 1-2 patches onto the skin daily. Remove & Discard patch within 12 hours or as directed by MD (Patient taking differently: Place 1-2 patches onto the skin daily as needed (pain). Remove & Discard patch within 12 hours or as directed by MD) 60 patch 3   losartan (COZAAR) 25 MG tablet Take 1 tablet (25 mg total) by mouth daily. 30 tablet  3   magnesium oxide (MAG-OX) 400 (240 Mg) MG tablet TAKE 1 TABLET BY MOUTH DAILY. 30 tablet 3   methocarbamol (ROBAXIN) 500 MG tablet Take 1 tablet (500 mg total) by mouth 3 (three) times daily as needed for muscle spasms. 30 tablet 0   methylphenidate (RITALIN) 20 MG tablet Take 1 tablet (20 mg total) by mouth daily.  0   omeprazole (PRILOSEC) 40 MG capsule TAKE ONE CAPSULE BY MOUTH TWICE DAILY 60 capsule 5   oxybutynin (DITROPAN-XL) 10 MG 24 hr tablet TAKE 1 TABLET BY MOUTH DAILY AT BEDTIME - NEEDS OFFICE VISIT FOR MORE REFILLS 30 tablet 0   oxyCODONE-acetaminophen (PERCOCET/ROXICET) 5-325 MG tablet TAKE 1 TABLET BY MOUTH EVERY 8 HOURS AS NEEDED FOR SEVERE PAIN 60 tablet 0   potassium chloride SA (KLOR-CON M) 20 MEQ tablet TAKE 1 TABLET BY MOUTH DAILY 30 tablet 3   promethazine (PHENERGAN) 25 MG tablet TAKE 1/2 TO 1 TABLET BY MOUTH EVERY 8 HOURS AS NEEDED FOR NAUSEA AND VOMITING 20 tablet 1   QUEtiapine (SEROQUEL) 25 MG tablet Take 25 mg by mouth at bedtime.     rizatriptan (MAXALT) 10 MG tablet Take 1 tablet (10 mg total) by mouth once as needed for up to 1 dose for migraine. May repeat in 2 hours if needed 12 tablet 5   Tiotropium Bromide Monohydrate (SPIRIVA RESPIMAT) 2.5 MCG/ACT AERS Inhale  1 each into the lungs daily.     triamcinolone ointment (KENALOG) 0.1 % Apply topically 2 (two) times daily as needed. to affected area(s) 80 g 3   Vilazodone HCl (VIIBRYD) 10 MG TABS Take 10 mg by mouth daily.     No facility-administered medications prior to visit.    ROS: Review of Systems  Constitutional:  Positive for fatigue. Negative for activity change, appetite change, chills and unexpected weight change.  HENT:  Positive for hearing loss. Negative for congestion, mouth sores and sinus pressure.   Eyes:  Negative for visual disturbance.  Respiratory:  Negative for cough and chest tightness.   Gastrointestinal:  Negative for abdominal pain and nausea.  Genitourinary:  Negative for difficulty  urinating, frequency and vaginal pain.  Musculoskeletal:  Negative for back pain and gait problem.  Skin:  Negative for pallor and rash.  Neurological:  Positive for dizziness, weakness and Beason-headedness. Negative for tremors, numbness and headaches.  Psychiatric/Behavioral:  Positive for dysphoric mood. Negative for confusion, sleep disturbance and suicidal ideas. The patient is nervous/anxious.     Objective:  BP 120/82 (BP Location: Right Arm, Patient Position: Sitting, Cuff Size: Large)   Pulse 82   Temp 98.6 F (37 C) (Oral)   Ht 5\' 6"  (1.676 m)   Wt 135 lb (61.2 kg)   LMP 10/29/2006 (Approximate)   SpO2 94%   BMI 21.79 kg/m   BP Readings from Last 3 Encounters:  03/18/23 120/82  12/17/22 118/70  12/13/22 134/70    Wt Readings from Last 3 Encounters:  03/18/23 135 lb (61.2 kg)  12/17/22 145 lb (65.8 kg)  12/13/22 146 lb 4.8 oz (66.4 kg)    Physical Exam Constitutional:      General: She is not in acute distress.    Appearance: Normal appearance. She is well-developed.  HENT:     Head: Normocephalic.     Right Ear: External ear normal.     Left Ear: External ear normal.     Nose: Nose normal.  Eyes:     General:        Right eye: No discharge.        Left eye: No discharge.     Conjunctiva/sclera: Conjunctivae normal.     Pupils: Pupils are equal, round, and reactive to [place].  Neck:     Thyroid: No thyromegaly.     Vascular: No JVD.     Trachea: No tracheal deviation.  Cardiovascular:     Rate and Rhythm: Normal rate and regular rhythm.     Heart sounds: Normal heart sounds.  Pulmonary:     Effort: No respiratory distress.     Breath sounds: No stridor. No wheezing.  Abdominal:     General: Bowel sounds are normal. There is no distension.     Palpations: Abdomen is soft. There is no mass.     Tenderness: There is no abdominal tenderness. There is no guarding or rebound.  Musculoskeletal:        General: No tenderness.     Cervical back: Normal  range of motion and neck supple. No rigidity.  Lymphadenopathy:     Cervical: No cervical adenopathy.  Skin:    Findings: No rash.  Neurological:     Mental Status: She is oriented to person, place, and time.     Cranial Nerves: No cranial nerve deficit.     Motor: No abnormal muscle tone.     Coordination: Coordination normal.     Deep Tendon  Reflexes: Reflexes normal.  Psychiatric:        Behavior: Behavior normal.        Thought Content: Thought content normal.        Judgment: Judgment normal.   Hard hearing  Lab Results  Component Value Date   WBC 6.6 11/30/2022   HGB 13.0 11/30/2022   HCT 38.6 11/30/2022   PLT 176 11/30/2022   GLUCOSE 69 (L) 12/17/2022   CHOL 197 05/28/2022   TRIG 86.0 05/28/2022   HDL 59.90 05/28/2022   LDLCALC 120 (H) 05/28/2022   ALT 9 12/17/2022   AST 12 12/17/2022   NA 140 12/17/2022   K 4.5 12/17/2022   CL 105 12/17/2022   CREATININE 0.95 12/17/2022   BUN 23 12/17/2022   CO2 29 12/17/2022   TSH 4.73 (H) 09/19/2022    DG Chest 2 View  Result Date: 11/30/2022 CLINICAL DATA:  Palpitations. EXAM: CHEST - 2 VIEW COMPARISON:  09/23/2022. FINDINGS: The heart size and mediastinal contours are within normal limits. Scattered nodular opacities are noted in the lungs bilaterally, similar in appearance to the prior exam. No consolidation, effusion, or pneumothorax. Cervical spinal fusion hardware is present. No acute osseous abnormality. IMPRESSION: Stable nodular opacities in the lungs bilaterally, possibly associated with atypical infection and unchanged from the prior exam. Electronically Signed   By: Thornell Sartorius M.D.   On: 11/30/2022 20:56    Assessment & Plan:   Problem List Items Addressed This Visit     Hypothyroidism - Primary (Chronic)    Chronic On Levothroid      B12 deficiency    On B12 q 2 weeks      Vitamin D deficiency    Continue with vitamin D      Anxiety disorder    F/u w/Dr Evelene Croon Cont on Seroquel, Xanax prn       Relevant Medications   Vilazodone HCl (VIIBRYD) 10 MG TABS   Lightheadedness    Stop Losartan if you continue getting lightheaded         No orders of the defined types were placed in this encounter.     Follow-up: No follow-ups on file.  Sonda Primes, MD

## 2023-03-27 ENCOUNTER — Other Ambulatory Visit: Payer: Self-pay

## 2023-03-27 ENCOUNTER — Emergency Department (HOSPITAL_COMMUNITY)
Admission: EM | Admit: 2023-03-27 | Discharge: 2023-03-28 | Disposition: A | Discharge status: Home / Self Care | Payer: 59 | Attending: Emergency Medicine | Admitting: Emergency Medicine

## 2023-03-27 ENCOUNTER — Encounter (HOSPITAL_COMMUNITY): Payer: Self-pay | Admitting: Emergency Medicine

## 2023-03-27 DIAGNOSIS — S70362A Insect bite (nonvenomous), left thigh, initial encounter: Secondary | ICD-10-CM | POA: Insufficient documentation

## 2023-03-27 DIAGNOSIS — T7421XA Adult sexual abuse, confirmed, initial encounter: Secondary | ICD-10-CM | POA: Diagnosis not present

## 2023-03-27 DIAGNOSIS — Z79899 Other long term (current) drug therapy: Secondary | ICD-10-CM | POA: Insufficient documentation

## 2023-03-27 DIAGNOSIS — Z202 Contact with and (suspected) exposure to infections with a predominantly sexual mode of transmission: Secondary | ICD-10-CM | POA: Insufficient documentation

## 2023-03-27 DIAGNOSIS — E876 Hypokalemia: Secondary | ICD-10-CM | POA: Insufficient documentation

## 2023-03-27 DIAGNOSIS — Z711 Person with feared health complaint in whom no diagnosis is made: Secondary | ICD-10-CM

## 2023-03-27 LAB — BASIC METABOLIC PANEL
Anion gap: 8 (ref 5–15)
BUN: 13 mg/dL (ref 8–23)
CO2: 28 mmol/L (ref 22–32)
Calcium: 8.9 mg/dL (ref 8.9–10.3)
Chloride: 102 mmol/L (ref 98–111)
Creatinine, Ser: 0.87 mg/dL (ref 0.44–1.00)
GFR, Estimated: >60 mL/min (ref >60)
Glucose, Bld: 134 mg/dL — ABNORMAL HIGH (ref 70–99)
Potassium: 2.5 mmol/L — CL (ref 3.5–5.1)
Sodium: 138 mmol/L (ref 135–145)

## 2023-03-27 LAB — URINALYSIS, ROUTINE W REFLEX MICROSCOPIC
Bilirubin Urine: NEGATIVE
Glucose, UA: NEGATIVE mg/dL
Ketones, ur: NEGATIVE mg/dL
Nitrite: NEGATIVE
Protein, ur: 30 mg/dL — AB
Specific Gravity, Urine: 1.02 (ref 1.005–1.030)
pH: 5 (ref 5.0–8.0)

## 2023-03-27 LAB — CBC
HCT: 40.4 % (ref 36.0–46.0)
Hemoglobin: 13.5 g/dL (ref 12.0–15.0)
MCH: 29.5 pg (ref 26.0–34.0)
MCHC: 33.4 g/dL (ref 30.0–36.0)
MCV: 88.2 fL (ref 80.0–100.0)
Platelets: 191 10*3/uL (ref 150–400)
RBC: 4.58 MIL/uL (ref 3.87–5.11)
RDW: 13.6 % (ref 11.5–15.5)
WBC: 7 10*3/uL (ref 4.0–10.5)
nRBC: 0 % (ref 0.0–0.2)

## 2023-03-27 MED ORDER — ACETAMINOPHEN 500 MG PO TABS
1000.0000 mg | ORAL_TABLET | Freq: Once | ORAL | Status: AC
  Administered 2023-03-28: 1000 mg via ORAL
  Filled 2023-03-27: qty 2

## 2023-03-27 NOTE — ED Triage Notes (Signed)
Pt c/o pelvic pain and right groin pain with hematuria and foul-smelling vaginal discharge since Sunday. Pt also states she fell backwards off a sofa over the weekend and landed on her right side, hurting her back. She also has a tick bite on her left thigh.

## 2023-03-27 NOTE — ED Notes (Signed)
Date and time results received: 03/27/23 1024 (use smartphrase ".now" to insert current time)  Test: Potassium Critical Value: 2.5  Name of Provider Notified: Z  Orders Received? Or Actions Taken?: NA

## 2023-03-28 DIAGNOSIS — T7421XA Adult sexual abuse, confirmed, initial encounter: Secondary | ICD-10-CM | POA: Diagnosis not present

## 2023-03-28 LAB — RPR: RPR Ser Ql: NONREACTIVE

## 2023-03-28 LAB — WET PREP, GENITAL
Clue Cells Wet Prep HPF POC: NONE SEEN
Sperm: NONE SEEN
Trich, Wet Prep: NONE SEEN
WBC, Wet Prep HPF POC: >=10 — AB (ref <10)
Yeast Wet Prep HPF POC: NONE SEEN

## 2023-03-28 LAB — HIV ANTIBODY (ROUTINE TESTING W REFLEX): HIV Screen 4th Generation wRfx: NONREACTIVE

## 2023-03-28 MED ORDER — LEVOFLOXACIN 500 MG PO TABS: 500.0000 mg | ORAL_TABLET | Freq: Every day | ORAL | 0 refills | Status: DC

## 2023-03-28 MED ORDER — POTASSIUM CHLORIDE CRYS ER 20 MEQ PO TBCR: 20.0000 meq | EXTENDED_RELEASE_TABLET | Freq: Every day | ORAL | 0 refills | Status: DC

## 2023-03-28 MED ORDER — LEVOFLOXACIN 500 MG PO TABS
500.0000 mg | ORAL_TABLET | Freq: Once | ORAL | Status: AC
  Administered 2023-03-28: 500 mg via ORAL
  Filled 2023-03-28: qty 1

## 2023-03-28 MED ORDER — FLUCONAZOLE 150 MG PO TABS: 150.0000 mg | ORAL_TABLET | Freq: Once | ORAL | 0 refills | Status: AC

## 2023-03-28 MED ORDER — POTASSIUM CHLORIDE CRYS ER 20 MEQ PO TBCR
40.0000 meq | EXTENDED_RELEASE_TABLET | Freq: Once | ORAL | Status: AC
  Administered 2023-03-28: 40 meq via ORAL
  Filled 2023-03-28: qty 2

## 2023-03-28 MED ORDER — FLUCONAZOLE 150 MG PO TABS
150.0000 mg | ORAL_TABLET | Freq: Once | ORAL | Status: AC
  Administered 2023-03-28: 150 mg via ORAL
  Filled 2023-03-28: qty 1

## 2023-03-28 MED ORDER — CEFTRIAXONE SODIUM 500 MG IJ SOLR
500.0000 mg | Freq: Once | INTRAMUSCULAR | Status: AC
  Administered 2023-03-28: 500 mg via INTRAMUSCULAR
  Filled 2023-03-28: qty 500

## 2023-03-28 MED ORDER — LIDOCAINE HCL (PF) 1 % IJ SOLN
INTRAMUSCULAR | Status: AC
  Administered 2023-03-28: 1 mL
  Filled 2023-03-28: qty 5
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
## 2023-03-30 LAB — URINE CULTURE: Culture: >=100000 — AB

## 2023-03-31 ENCOUNTER — Telehealth (HOSPITAL_BASED_OUTPATIENT_CLINIC_OR_DEPARTMENT_OTHER): Payer: Self-pay | Admitting: *Deleted

## 2023-03-31 NOTE — Telephone Encounter (Signed)
Post ED Visit - Positive Culture Follow-up  Culture report reviewed by antimicrobial stewardship pharmacist: [facility] []  Enzo Bi, Pharm.D. []  Celedonio Miyamoto, Pharm.D., BCPS AQ-ID []  Garvin Fila, Pharm.D., BCPS []  Georgina Pillion, [address].D., BCPS []  [place], [address].D., BCPS, AAHIVP []  Estella Husk, Pharm.D., BCPS, AAHIVP []  Lysle Pearl, PharmD, BCPS []  Phillips Climes, PharmD, BCPS []  Agapito Games, PharmD, BCPS []  Verlan Friends, PharmD []  Mervyn Gay, PharmD, BCPS [x]  Riccardo Dubin, PharmD  [facility] []  Len Childs, PharmD []  Greer Pickerel, PharmD []  Adalberto Cole, PharmD []  Perlie Gold, Rph []  Lonell Face) Jean Rosenthal, PharmD []  Earl Many, PharmD []  Junita Push, PharmD []  Dorna Leitz, PharmD []  Terrilee Files, PharmD []  Lynann Beaver, PharmD []  Keturah Barre, PharmD []  Loralee Pacas, PharmD []  Bernadene Person, PharmD   Positive urine culture Treated with Levofloxacin, organism sensitive to the same and no further patient follow-up is required at this time.  Melody [name] 03/31/2023, 12:10 PM

## 2023-04-03 ENCOUNTER — Telehealth: Payer: Self-pay | Admitting: *Deleted

## 2023-04-03 ENCOUNTER — Emergency Department (HOSPITAL_COMMUNITY): Payer: 59

## 2023-04-03 ENCOUNTER — Emergency Department (HOSPITAL_COMMUNITY)
Admission: EM | Admit: 2023-04-03 | Discharge: 2023-04-04 | Disposition: A | Discharge status: Home / Self Care | Payer: 59 | Attending: Emergency Medicine | Admitting: Emergency Medicine

## 2023-04-03 ENCOUNTER — Other Ambulatory Visit: Payer: Self-pay | Admitting: Internal Medicine

## 2023-04-03 ENCOUNTER — Other Ambulatory Visit: Payer: Self-pay

## 2023-04-03 ENCOUNTER — Encounter (HOSPITAL_COMMUNITY): Payer: Self-pay

## 2023-04-03 DIAGNOSIS — E860 Dehydration: Secondary | ICD-10-CM

## 2023-04-03 DIAGNOSIS — N3281 Overactive bladder: Secondary | ICD-10-CM

## 2023-04-03 DIAGNOSIS — I959 Hypotension, unspecified: Secondary | ICD-10-CM | POA: Diagnosis not present

## 2023-04-03 DIAGNOSIS — J811 Chronic pulmonary edema: Secondary | ICD-10-CM | POA: Diagnosis not present

## 2023-04-03 DIAGNOSIS — R42 Dizziness and giddiness: Secondary | ICD-10-CM | POA: Diagnosis not present

## 2023-04-03 MED ORDER — LACTATED RINGERS IV BOLUS (SEPSIS)
1000.0000 mL | Freq: Once | INTRAVENOUS | Status: AC
  Administered 2023-04-03: 1000 mL via INTRAVENOUS

## 2023-04-03 NOTE — Progress Notes (Signed)
  Care Coordination  Outreach Note  04/03/2023 Name: Melody [name] MRN: [id number] DOB: 1956/10/01   Care Coordination Outreach Attempts: An unsuccessful telephone outreach was attempted today to offer the patient information about available care coordination services.  Follow Up Plan:  Additional outreach attempts will be made to offer the patient care coordination information and services.   Encounter Outcome:  No Answer  Christie Nottingham  Care Coordination Care Guide  Direct Dial: [phone]

## 2023-04-03 NOTE — ED Triage Notes (Signed)
BIBA after friend called ems due to dizziness and lightheaded.  Ems reports bp 50 systolic on arrival.  NS with ems.  Ems reports friend said she may have took to many xanax.  Denies cp/sob

## 2023-04-03 NOTE — ED Provider Notes (Signed)
[place] EMERGENCY DEPARTMENT AT [facility] Provider Note   CSN: [id number] Arrival date & time: 04/03/23  2309     History {Add pertinent medical, surgical, social history, OB history to HPI:1} Chief Complaint  Patient presents with   Hypotension    Melody [name] is a 67 y.o. female.  Patient presents to the emergency department for evaluation of dizziness.  Patient has been feeling weak, dizzy especially with trying to get up.  Patient reports that she has a cough and feels like she might be getting pneumonia.  She has brought to the ER by ambulance, blood pressure was low.  She received 500 mL of normal saline during transport.       Home Medications Prior to Admission medications   Medication Sig Start Date End Date Taking? Authorizing Provider  albuterol (VENTOLIN HFA) 108 (90 Base) MCG/ACT inhaler INHALE TWO PUFFS BY MOUTH EVERY 4 HOURS AS NEEDED FOR wheezing OR SHORTNESS OF BREATH 11/27/22   Plotnikov, Georgina Quint, MD  ALPRAZolam Prudy Feeler) 1 MG tablet Take 1 mg by mouth 3 (three) times daily.    [provider]  cephALEXin (KEFLEX) 500 MG capsule Take 2 capsules (1,000 mg total) by mouth 2 (two) times daily. 01/30/23   Plotnikov, Georgina Quint, MD  Cholecalciferol (VITAMIN D3) 1.25 MG (50000 UT) CAPS TAKE ONE CAPSULE BY MOUTH EVERY 14 DAYS 09/13/22   Plotnikov, Georgina Quint, MD  cyanocobalamin (VITAMIN B12) 1000 MCG/ML injection INJECT INTO THE SKIN EVERY 14 DAYS 08/22/22   Plotnikov, Georgina Quint, MD  diclofenac Sodium (VOLTAREN) 1 % GEL APPLY TWO GRAMS TO AFFECTED AREA(S) FOUR TIMES DAILY Patient taking differently: Apply 2 g topically 4 (four) times daily as needed (shoulder pain). 02/22/22   Plotnikov, Georgina Quint, MD  fluticasone (FLONASE) 50 MCG/ACT nasal spray INSTILL TWO SPRAYS IN EACH NOSTRIL DAILY 09/24/22   Plotnikov, Georgina Quint, MD  guaiFENesin-dextromethorphan (ROBITUSSIN DM) 100-10 MG/5ML syrup Take 5 mLs by mouth every 4 (four) hours as needed for cough.  12/14/22   Corwin Levins, MD  levocetirizine (XYZAL) 5 MG tablet TAKE 1 TABLET BY MOUTH EVERY EVENING (FOR ALLERGY) Patient taking differently: Take 5 mg by mouth daily as needed for allergies. 09/20/21   Plotnikov, Georgina Quint, MD  levofloxacin (LEVAQUIN) 500 MG tablet Take 1 tablet (500 mg total) by mouth daily. 03/28/23   Gilda Crease, MD  levothyroxine (SYNTHROID) 50 MCG tablet TAKE 1 TABLET BY MOUTH DAILY BEFORE BREAKFAST 03/05/23   Plotnikov, Georgina Quint, MD  lidocaine (LIDODERM) 5 % Place 1-2 patches onto the skin daily. Remove & Discard patch within 12 hours or as directed by MD Patient taking differently: Place 1-2 patches onto the skin daily as needed (pain). Remove & Discard patch within 12 hours or as directed by MD 05/28/22   Plotnikov, Georgina Quint, MD  losartan (COZAAR) 25 MG tablet Take 1 tablet (25 mg total) by mouth daily. 12/18/22   Plotnikov, Georgina Quint, MD  magnesium oxide (MAG-OX) 400 (240 Mg) MG tablet TAKE 1 TABLET BY MOUTH DAILY. 12/17/22   Plotnikov, Georgina Quint, MD  methocarbamol (ROBAXIN) 500 MG tablet Take 1 tablet (500 mg total) by mouth 3 (three) times daily as needed for muscle spasms. 10/16/22   Plotnikov, Georgina Quint, MD  methylphenidate (RITALIN) 20 MG tablet Take 1 tablet (20 mg total) by mouth daily. 11/10/18   Johnson, Clanford L, MD  omeprazole (PRILOSEC) 40 MG capsule TAKE ONE CAPSULE BY MOUTH TWICE DAILY 03/05/23   Carlan,  Chelsea L, NP  oxybutynin (DITROPAN-XL) 10 MG 24 hr tablet Take 1 tablet (10 mg total) by mouth at bedtime. Follow-up appt due in Nov must see provider for future refills 04/03/23   Plotnikov, Georgina Quint, MD  oxyCODONE-acetaminophen (PERCOCET/ROXICET) 5-325 MG tablet TAKE 1 TABLET BY MOUTH EVERY 8 HOURS AS NEEDED FOR SEVERE PAIN 12/17/22   Plotnikov, Georgina Quint, MD  potassium chloride SA (KLOR-CON M) 20 MEQ tablet Take 1 tablet (20 mEq total) by mouth daily. 03/28/23   Gilda Crease, MD  promethazine (PHENERGAN) 25 MG tablet TAKE 1/2 TO 1 TABLET BY  MOUTH EVERY 8 HOURS AS NEEDED FOR NAUSEA AND VOMITING 05/25/22   Plotnikov, Georgina Quint, MD  QUEtiapine (SEROQUEL) 25 MG tablet Take 25 mg by mouth at bedtime.    [provider]  rizatriptan (MAXALT) 10 MG tablet Take 1 tablet (10 mg total) by mouth once as needed for up to 1 dose for migraine. May repeat in 2 hours if needed 06/13/21   Plotnikov, Georgina Quint, MD  Tiotropium Bromide Monohydrate (SPIRIVA RESPIMAT) 2.5 MCG/ACT AERS Inhale 1 each into the lungs daily.    [provider]  triamcinolone ointment (KENALOG) 0.1 % Apply topically 2 (two) times daily as needed. to affected area(s) 02/22/22   Plotnikov, Georgina Quint, MD  Vilazodone HCl (VIIBRYD) 10 MG TABS Take 10 mg by mouth daily. 03/12/23   [provider]      Allergies    Flagyl [metronidazole], Doxycycline hyclate, Dulera [mometasone furo-formoterol fum], Gabapentin, Nabumetone, Sulfa antibiotics, Tetracycline hcl, and Tramadol hcl    Review of Systems   Review of Systems  Physical Exam Updated Vital Signs BP (!) 84/56   Pulse 71   Temp 98.5 F (36.9 C)   Resp 15   Wt 60 kg   LMP 10/29/2006 (Approximate)   SpO2 99%   BMI 21.35 kg/m  Physical Exam Vitals and nursing note reviewed.  Constitutional:      General: She is not in acute distress.    Appearance: She is well-developed.  HENT:     Head: Normocephalic and atraumatic.     Mouth/Throat:     Mouth: Mucous membranes are moist.  Eyes:     General: Vision grossly intact. Gaze aligned appropriately.     Extraocular Movements: Extraocular movements intact.     Conjunctiva/sclera: Conjunctivae normal.  Cardiovascular:     Rate and Rhythm: Normal rate and regular rhythm.     Pulses: Normal pulses.     Heart sounds: Normal heart sounds, S1 normal and S2 normal. No murmur heard.    No friction rub. No gallop.  Pulmonary:     Effort: Pulmonary effort is normal. No respiratory distress.     Breath sounds: Normal breath sounds.  Abdominal:      General: Bowel sounds are normal.     Palpations: Abdomen is soft.     Tenderness: There is no abdominal tenderness. There is no guarding or rebound.     Hernia: No hernia is present.  Musculoskeletal:        General: No swelling.     Cervical back: Full passive range of motion without pain, normal range of motion and neck supple. No spinous process tenderness or muscular tenderness. Normal range of motion.     Right lower leg: No edema.     Left lower leg: No edema.  Skin:    General: Skin is warm and dry.     Capillary Refill: Capillary refill takes less  than 2 seconds.     Findings: No ecchymosis, erythema, rash or wound.  Neurological:     General: No focal deficit present.     Mental Status: She is alert and oriented to person, place, and time.     GCS: GCS eye subscore is 4. GCS verbal subscore is 5. GCS motor subscore is 6.     Cranial Nerves: Cranial nerves 2-12 are intact.     Sensory: Sensation is intact.     Motor: Motor function is intact.     Coordination: Coordination is intact.     Comments: Patient appears overmedicated, speech slightly slurred  Psychiatric:        Attention and Perception: Attention normal.        Mood and Affect: Mood normal.        Speech: Speech normal.        Behavior: Behavior normal.     ED Results / Procedures / Treatments   Labs (all labs ordered are listed, but only abnormal results are displayed) Labs Reviewed - No data to display  EKG None  Radiology No results found.  Procedures Procedures  {Document cardiac monitor, telemetry assessment procedure when appropriate:1}  Medications Ordered in ED Medications  lactated ringers bolus 1,000 mL (has no administration in time range)    ED Course/ Medical Decision Making/ A&P   {   Click here for ABCD2, HEART and other calculatorsREFRESH Note before signing :1}                          Medical Decision Making Amount and/or Complexity of Data Reviewed Labs:  ordered. Radiology: ordered. ECG/medicine tests: ordered.   ***  {Document critical care time when appropriate:1} {Document review of labs and clinical decision tools ie heart score, Chads2Vasc2 etc:1}  {Document your independent review of radiology images, and any outside records:1} {Document your discussion with family members, caretakers, and with consultants:1} {Document social determinants of health affecting pt's care:1} {Document your decision making why or why not admission, treatments were needed:1} Final Clinical Impression(s) / ED Diagnoses Final diagnoses:  None    Rx / DC Orders ED Discharge Orders     None

## 2023-04-04 DIAGNOSIS — E860 Dehydration: Secondary | ICD-10-CM | POA: Diagnosis not present

## 2023-04-04 DIAGNOSIS — J811 Chronic pulmonary edema: Secondary | ICD-10-CM | POA: Diagnosis not present

## 2023-04-04 LAB — URINALYSIS, W/ REFLEX TO CULTURE (INFECTION SUSPECTED)
Bacteria, UA: NONE SEEN
Bilirubin Urine: NEGATIVE
Glucose, UA: NEGATIVE mg/dL
Hgb urine dipstick: NEGATIVE
Ketones, ur: NEGATIVE mg/dL
Nitrite: NEGATIVE
Protein, ur: NEGATIVE mg/dL
Specific Gravity, Urine: 1.008 (ref 1.005–1.030)
pH: 7 (ref 5.0–8.0)

## 2023-04-04 LAB — CBC WITH DIFFERENTIAL/PLATELET
Abs Immature Granulocytes: 0.01 10*3/uL (ref 0.00–0.07)
Basophils Absolute: 0 10*3/uL (ref 0.0–0.1)
Basophils Relative: 0 %
Eosinophils Absolute: 0.1 10*3/uL (ref 0.0–0.5)
Eosinophils Relative: 1 %
HCT: 36 % (ref 36.0–46.0)
Hemoglobin: 11.8 g/dL — ABNORMAL LOW (ref 12.0–15.0)
Immature Granulocytes: 0 %
Lymphocytes Relative: 25 %
Lymphs Abs: 1.2 10*3/uL (ref 0.7–4.0)
MCH: 29.6 pg (ref 26.0–34.0)
MCHC: 32.8 g/dL (ref 30.0–36.0)
MCV: 90.2 fL (ref 80.0–100.0)
Monocytes Absolute: 0.4 10*3/uL (ref 0.1–1.0)
Monocytes Relative: 9 %
Neutro Abs: 3 10*3/uL (ref 1.7–7.7)
Neutrophils Relative %: 65 %
Platelets: 181 10*3/uL (ref 150–400)
RBC: 3.99 MIL/uL (ref 3.87–5.11)
RDW: 13.4 % (ref 11.5–15.5)
WBC: 4.8 10*3/uL (ref 4.0–10.5)
nRBC: 0 % (ref 0.0–0.2)

## 2023-04-04 LAB — COMPREHENSIVE METABOLIC PANEL
ALT: 11 U/L (ref 0–44)
AST: 13 U/L — ABNORMAL LOW (ref 15–41)
Albumin: 3.1 g/dL — ABNORMAL LOW (ref 3.5–5.0)
Alkaline Phosphatase: 55 U/L (ref 38–126)
Anion gap: 8 (ref 5–15)
BUN: 17 mg/dL (ref 8–23)
CO2: 23 mmol/L (ref 22–32)
Calcium: 8.5 mg/dL — ABNORMAL LOW (ref 8.9–10.3)
Chloride: 104 mmol/L (ref 98–111)
Creatinine, Ser: 1.43 mg/dL — ABNORMAL HIGH (ref 0.44–1.00)
GFR, Estimated: 40 mL/min — ABNORMAL LOW (ref >60)
Glucose, Bld: 138 mg/dL — ABNORMAL HIGH (ref 70–99)
Potassium: 4.2 mmol/L (ref 3.5–5.1)
Sodium: 135 mmol/L (ref 135–145)
Total Bilirubin: 0.6 mg/dL (ref 0.3–1.2)
Total Protein: 5.9 g/dL — ABNORMAL LOW (ref 6.5–8.1)

## 2023-04-04 LAB — CULTURE, BLOOD (ROUTINE X 2)

## 2023-04-04 LAB — PROTIME-INR
INR: 1 (ref 0.8–1.2)
Prothrombin Time: 13.7 seconds (ref 11.4–15.2)

## 2023-04-04 LAB — LACTIC ACID, PLASMA
Lactic Acid, Venous: 0.7 mmol/L (ref 0.5–1.9)
Lactic Acid, Venous: 1.2 mmol/L (ref 0.5–1.9)

## 2023-04-04 LAB — APTT: aPTT: 26 seconds (ref 24–36)

## 2023-04-04 MED ORDER — SODIUM CHLORIDE 0.9 % IV BOLUS
1000.0000 mL | Freq: Once | INTRAVENOUS | Status: AC
  Administered 2023-04-04: 1000 mL via INTRAVENOUS

## 2023-04-04 NOTE — Progress Notes (Signed)
  Care Coordination  Outreach Note  04/04/2023 Name: Melody [name] MRN: [id number] DOB: April 08, 1956   Care Coordination Outreach Attempts: A second unsuccessful outreach was attempted today to offer the patient with information about available care coordination services.  Follow Up Plan:  Additional outreach attempts will be made to offer the patient care coordination information and services.   Encounter Outcome:  No Answer  Christie Nottingham  Care Coordination Care Guide  Direct Dial: [phone]

## 2023-04-05 LAB — CULTURE, BLOOD (ROUTINE X 2)
Culture: NO GROWTH
Special Requests: ADEQUATE

## 2023-04-05 NOTE — Progress Notes (Signed)
  Care Coordination   Note   04/05/2023 Name: Melody [name] MRN: [id number] DOB: 10-01-1956  Melody [name] is a 67 y.o. year old female who sees Plotnikov, Georgina Quint, MD for primary care. I reached out to [name] by phone today to offer care coordination services.  Ms. Jarvis was given information about Care Coordination services today including:   The Care Coordination services include support from the care team which includes your Nurse Coordinator, Clinical Social Worker, or Pharmacist.  The Care Coordination team is here to help remove barriers to the health concerns and goals most important to you. Care Coordination services are voluntary, and the patient may decline or stop services at any time by request to their care team member.   Care Coordination Consent Status: Patient agreed to services and verbal consent obtained.   Follow up plan:  Telephone appointment with care coordination team member scheduled for:  04/10/23  Encounter Outcome:  Pt. Scheduled  [facility] Coordination Care Guide  Direct Dial: [phone]

## 2023-04-06 LAB — CULTURE, BLOOD (ROUTINE X 2)

## 2023-04-09 ENCOUNTER — Telehealth: Payer: Self-pay | Admitting: *Deleted

## 2023-04-09 LAB — CULTURE, BLOOD (ROUTINE X 2)
Culture: NO GROWTH
Special Requests: ADEQUATE

## 2023-04-09 NOTE — Telephone Encounter (Signed)
Transition Care Management Unsuccessful Follow-up Telephone Call  Date of discharge and from where:  [place]  04/04/2023  Attempts:  1st Attempt  Reason for unsuccessful TCM follow-up call:  Left voice message    

## 2023-04-10 ENCOUNTER — Telehealth: Payer: Self-pay | Admitting: *Deleted

## 2023-04-10 ENCOUNTER — Ambulatory Visit: Payer: Self-pay | Admitting: Licensed Clinical Social Worker

## 2023-04-10 NOTE — Patient Outreach (Signed)
  Melody Coordination  Initial Visit Note   04/10/2023 Name: Melody [name] MRN: [id number] DOB: 09-30-1956  Melody [name] is a 67 y.o. year old female who sees Plotnikov, Georgina Quint, MD for primary Melody. I spoke with  Melody [name] by phone today.  What matters to the patients health and wellness today?  Community support for housing.  Willing to discuss therapy options at next encounter.  Patient is experiencing stress from her long-term housing crisis.    Goals Addressed             This Visit's Progress    Melody Coordination for community support       Activities and task to complete in order to accomplish goals.   Keep all upcoming appointment discussed today Continue with compliance of taking medication prescribed by Doctor I have placed a referral with [facility] someone will call you with housing resources   We will continue to explore therapy options for you       SDOH assessments and interventions completed:  Yes  SDOH Interventions Today    Flowsheet Row Most Recent Value  SDOH Interventions   Housing Interventions [facility] Referral  Transportation Interventions Intervention Not Indicated  Financial Strain Interventions Other (Comment)  Stress Interventions Provide Counseling       Melody Coordination Interventions:  Yes, provided  Interventions Today    Flowsheet Row Most Recent Value  Chronic Disease   Chronic disease during today's visit Chronic Obstructive Pulmonary Disease (COPD)  General Interventions   General Interventions Discussed/Reviewed General Interventions Reviewed, Psychiatrist Interventions   Education Provided Provided Education  Provided Verbal Education On Mental Health/Coping with Illness, Development worker, community  Mental Health Interventions   Mental Health Discussed/Reviewed Mental Health Discussed, Anxiety, Depression  [solution focused ,  active listening,  assessed needs and current treatment]  Nutrition Interventions    Nutrition Discussed/Reviewed Nutrition Reviewed  [has foodstamps]  Safety Interventions   Safety Discussed/Reviewed Safety Discussed  [denies SI or HI]       Follow up plan: Follow up call scheduled for 04/17/23    Encounter Outcome:  Pt. Visit Completed   Sammuel Hines, LCSW Social Work Melody Coordination  [facility] [employer] [phone]

## 2023-04-10 NOTE — Telephone Encounter (Signed)
Transition Care Management Follow-up Telephone Call Date of discharge and from where: [facility]  02/02/2023 How have you been since you were released from the hospital? Just woke up but feeling better  Any questions or concerns? No  Items Reviewed: Did the pt receive and understand the discharge instructions provided? Yes  Medications obtained and verified? Yes  Other? No  Any new allergies since your discharge? No  Dietary orders reviewed? No    Follow up appointments reviewed:  PCP Hospital f/u appt confirmed? Yes  Not  sure when  Are transportation arrangements needed? No  If their condition worsens, is the pt aware to call PCP or go to the Emergency Dept.? Yes Was the patient provided with contact information for the PCP's office or ED? Yes Was to pt encouraged to call back with questions or concerns? Yes

## 2023-04-10 NOTE — Patient Instructions (Signed)
Social Work Visit Information  Thank you for taking time to visit with me today. Please don't hesitate to contact me if I can be of assistance to you.   Following are the goals we discussed today:   Goals Addressed             This Visit's Progress    Care Coordination for community support       Activities and task to complete in order to accomplish goals.   Keep all upcoming appointment discussed today Continue with compliance of taking medication prescribed by Doctor I have placed a referral with [facility] someone will call you with housing resources   We will continue to explore therapy options for you        Our next appointment is by telephone on 04/17/23 at 3:15   Please call the care guide team at [phone] if you need to cancel or reschedule your appointment.   If you or anyone you know are experiencing a Mental Health or Behavioral Health Crisis or need someone to talk to, please call the Suicide and Crisis Lifeline: 988 call the [place] National Suicide Prevention Lifeline: 551 887 2035 or TTY: 979-156-5312 TTY 253-237-8542) to talk to a trained counselor call 1-800-273-TALK (toll free, 24 hour hotline) call the [facility]: 254-718-2134   Patient verbalizes understanding of instructions and care plan provided today and agrees to view in MyChart. Active MyChart status and patient understanding of how to access instructions and care plan via MyChart confirmed with patient.       Sammuel Hines, LCSW Social Work Care Coordination  [facility] [employer] [employer] [phone]

## 2023-04-17 ENCOUNTER — Ambulatory Visit: Payer: Self-pay | Admitting: Licensed Clinical Social Worker

## 2023-04-17 DIAGNOSIS — F419 Anxiety disorder, unspecified: Secondary | ICD-10-CM

## 2023-04-17 DIAGNOSIS — F319 Bipolar disorder, unspecified: Secondary | ICD-10-CM

## 2023-04-17 NOTE — Patient Outreach (Signed)
  Care Coordination  Follow Up Visit Note   04/17/2023 Name: TEMARI WHINNERY MRN: [id number] DOB: [birth year]  SHANTEZ WIKER is a 67 y.o. year old female who sees Plotnikov, Georgina Quint, MD for primary care. I spoke with  [name] by phone today.  What matters to the patients health and wellness today?    Patient continues to experience daily stressors.  Provided update that [place] [facility] unable to contact her and left voice message. Patient having difficulty with phone, LCSW sent message to [facility] Cares Coordinator to obtain information.   Goals Addressed             This Visit's Progress    Care Coordination for community support       Activities and task to complete in order to accomplish goals.   Keep all upcoming appointment discussed today Continue with compliance of taking medication prescribed by Doctor I have placed a referral with [facility] Minerva Areola has tried to call you with housing resources please check your voicemail if you can   We will continue to explore therapy options for you. I have one option below that we will discuss next week  [facility] [address] [place], [place] [address] Phone [phone]  Housing information : Wyn Forster [phone] and Sidney Ace [phone]  housing authority         SDOH assessments and interventions completed:  No   Care Coordination Interventions:  Yes, provided  Interventions Today    Flowsheet Row Most Recent Value  Chronic Disease   Chronic disease during today's visit Chronic Obstructive Pulmonary Disease (COPD)  General Interventions   General Interventions Discussed/Reviewed General Interventions Reviewed, Communication with, Risk analyst with --  [employer] health providers for therapy & [place] [facility]]  Mental Health Interventions   Mental Health Discussed/Reviewed Mental Health Reviewed, Coping Strategies  [active listening, referral to [facility] in  [place]]       Follow up plan: Referral made to [facility] in [place]  Follow up call scheduled for 04/22/23    Encounter Outcome:  Pt. Visit Completed   Sammuel Hines, LCSW Social Work Care Coordination  [facility] [employer] [phone]

## 2023-04-17 NOTE — Patient Instructions (Signed)
Social Work Visit Information  Thank you for taking time to visit with me today. Please don't hesitate to contact me if I can be of assistance to you.   Following are the goals we discussed today:   Goals Addressed             This Visit's Progress    Care Coordination for community support       Activities and task to complete in order to accomplish goals.   Keep all upcoming appointment discussed today Continue with compliance of taking medication prescribed by Doctor I have placed a referral with [facility] Minerva Areola has tried to call you with housing resources please check your voicemail if you can I have place a referral with [facility] to assist with managing your mental health needs. They will contact you. Please contact them  to follow up if needed the first appointment is in Aug.  We will continue to explore therapy options for you. I have one option below that we will discuss next week  [facility] [address] [place], [place] [address] Phone [phone]  Housing information : Wyn Forster [phone] and Sidney Ace [phone]  housing authority          Our next appointment is by telephone on 04/22/23 at 3:30   Please call the care guide team at [phone] if you need to cancel or reschedule your appointment.   If you or anyone you know are experiencing a Mental Health or Behavioral Health Crisis or need someone to talk to, please call the Suicide and Crisis Lifeline: 988 call the [place] National Suicide Prevention Lifeline: 714-205-7009 or TTY: 318-578-2225 TTY (228)019-6205) to talk to a trained counselor call 1-800-273-TALK (toll free, 24 hour hotline) call the [facility]: (724) 237-9913   Patient verbalizes understanding of instructions and care plan provided today and agrees to view in MyChart. Active MyChart status and patient understanding of how to access instructions and care plan  via MyChart confirmed with patient.       Sammuel Hines, LCSW Social Work Care Coordination  [facility] [employer] [phone]

## 2023-04-22 ENCOUNTER — Ambulatory Visit: Payer: Self-pay | Admitting: Licensed Clinical Social Worker

## 2023-04-22 NOTE — Patient Outreach (Signed)
  Care Coordination  Follow Up Visit Note   04/22/2023 Name: AEVA POSEY MRN: [id number] DOB: May 05, 1956  KAIREE KOZMA is a 67 y.o. year old female who sees Plotnikov, Georgina Quint, MD for primary care. I spoke with  [name] by phone today.  What matters to the patients health and wellness today?  Decreasing stressors associated with housing and managing her mental health needs    Goals Addressed             This Visit's Progress    Care Coordination for community support       Activities and task to complete in order to accomplish goals.   Keep all upcoming appointment discussed today Continue with compliance of taking medication prescribed by Doctor I have placed a referral with [facility] Alcario Drought has tried to call you with housing resources I am sorry you are unable to pull them up Today we were able to get you a therapy appointment with [facility]  Aug. 19th 11:00AM with Georgana Curio [phone]  this is an in person visit   We will continue to explore housing options and resources for you during our next call Housing information : Wyn Forster [phone] and Sidney Ace [phone]  housing authority         SDOH assessments and interventions completed:  No   Care Coordination Interventions:  Yes, provided  Interventions Today    Flowsheet Row Most Recent Value  Chronic Disease   Chronic disease during today's visit Chronic Obstructive Pulmonary Disease (COPD)  General Interventions   General Interventions Discussed/Reviewed Communication with  Communication with PCP/Specialists  [on hold to connect pateint for onging therapy]  Education Interventions   Education Provided Provided Education  Provided Verbal Education On Mental Health/Coping with Illness  Mental Health Interventions   Mental Health Discussed/Reviewed Mental Health Reviewed, Anxiety, Depression, Coping Strategies  [motivational interviewing,  problem solving,  solution  focused, active listening]       Follow up plan: Follow up call scheduled for 05/07/23    Encounter Outcome:  Pt. Visit Completed   Sammuel Hines, LCSW Social Work Care Coordination  [facility] [employer] [phone]

## 2023-04-22 NOTE — Patient Instructions (Signed)
Social Work Visit Information  Thank you for taking time to visit with me today. Please don't hesitate to contact me if I can be of assistance to you.   Following are the goals we discussed today:   Goals Addressed             This Visit's Progress    Care Coordination for community support       Activities and task to complete in order to accomplish goals.   Keep all upcoming appointment discussed today Continue with compliance of taking medication prescribed by Doctor I have placed a referral with [facility] Alcario Drought has tried to call you with housing resources I am sorry you are unable to pull them up Today we were able to get you a therapy appointment with [facility]  Aug. 19th 11:00AM with Georgana Curio [phone]  this is an in person visit   We will continue to explore housing options and resources for you during our next call Housing information : Wyn Forster [phone] and Sidney Ace [phone]  housing authority         Our next appointment is by telephone on 05/07/23 at 2:45   Please call the care guide team at [phone] if you need to cancel or reschedule your appointment.   If you or anyone you know are experiencing a Mental Health or Behavioral Health Crisis or need someone to talk to, please call the Suicide and Crisis Lifeline: 988 call the [place] National Suicide Prevention Lifeline: (314)693-2597 or TTY: (475)862-3153 TTY 469 285 5400) to talk to a trained counselor call 1-800-273-TALK (toll free, 24 hour hotline) call the [facility]: (970)435-1477   Patient verbalizes understanding of instructions and care plan provided today and agrees to view in MyChart. Active MyChart status and patient understanding of how to access instructions and care plan via MyChart confirmed with patient.       Sammuel Hines, LCSW Social Work Care Coordination  [facility] [employer] [phone]

## 2023-04-23 ENCOUNTER — Ambulatory Visit (INDEPENDENT_AMBULATORY_CARE_PROVIDER_SITE_OTHER): Payer: 59 | Admitting: Gastroenterology

## 2023-04-29 ENCOUNTER — Other Ambulatory Visit: Payer: Self-pay | Admitting: Internal Medicine

## 2023-04-30 ENCOUNTER — Encounter: Payer: Self-pay | Admitting: Licensed Clinical Social Worker

## 2023-04-30 NOTE — Patient Outreach (Signed)
  Care Coordination   Collaboration/ consultation  Note   04/30/2023 Name: Melody [name] MRN: [id number] DOB: 25-Jun-1956  Melody [name] is a 67 y.o. year old female who sees Plotnikov, Georgina Quint, MD for primary care. I  did not engage with patient during this encounter  What matters to the patients health and wellness today?  Locating housing and connecting for therapy   Patient was not interviewed or contacted during this encounter Conducted brief assessment, recommendations and relevant information discussed.   LCSW  collaborated with BSW for consultation and community partners  to assist with meeting patient's needs.  .     Goals Addressed             This Visit's Progress    Care Coordination for community support       Activities and task to complete in order to accomplish goals.   Keep all upcoming appointment discussed today Continue with compliance of taking medication prescribed by Doctor I have placed a referral with [facility] someone from [facility] will be contacting you about the condition of your current home. Today we were able to get you a therapy appointment with [facility]  Aug. 19th 11:00AM with Melody [name] [phone]  this is an in person visit   I contacted  [employer] [phone]  , they have a long wait list we can discuss other options at our next call  Melody [name] [phone]   housing authority         SDOH assessments and interventions completed:  No   Care Coordination Interventions:  Yes, provided  Interventions Today    Flowsheet Row Most Recent Value  Chronic Disease   Chronic disease during today's visit Chronic Obstructive Pulmonary Disease (COPD)  General Interventions   General Interventions Discussed/Reviewed Communication with, [employer] with Social Work  [employer] resources,  and review of [place] [facility]]       Follow up plan: Follow up call  scheduled for 05/07/23    Encounter Outcome:  Pt. Visit Completed   Melody Hines, LCSW Social Work Care Coordination  [facility] Melody [name] [employer] [phone]

## 2023-04-30 NOTE — Patient Instructions (Signed)
  Patient was not contacted during this encounter.  LCSW collaborated with care team to accomplish patient's care plan goal   Nahun Kronberg, LCSW Social Work Care Coordination  [phone]  

## 2023-05-07 ENCOUNTER — Ambulatory Visit: Payer: Self-pay | Admitting: Licensed Clinical Social Worker

## 2023-05-07 NOTE — Patient Outreach (Signed)
  Care Coordination  Follow Up Visit Note   05/07/2023 Name: Melody [name] MRN: [id number] DOB: [birth year]  Melody [name] is a 67 y.o. year old female who sees Plotnikov, Georgina Quint, MD for primary care. I spoke with  Melody [name] by phone today.  What matters to the patients health and wellness today?  Finding housing and managing symptoms of anxiety.    Goals Addressed             This Visit's Progress    Care Coordination for community support       Activities and task to complete in order to accomplish goals.   Keep all upcoming appointment discussed today Continue with compliance of taking medication prescribed by Doctor I have placed a referral with Melody [name] someone from Melody [name] will be contacting you about the condition of your current home. keep therapy appointment with Melody [name]  Aug. 19th 11:00AM with Melody [name] [phone]  this is an in person visit  Call Dr. Carie [name] office to confirm appointment day and time  I contacted  Melody [name] [phone]  , they have a long wait list   Melody [name] [phone]   housing authority         SDOH assessments and interventions completed:  No  Care Coordination Interventions:  Yes, provided  Interventions Today    Flowsheet Row Most Recent Value  Chronic Disease   Chronic disease during today's visit Chronic Obstructive Pulmonary Disease (COPD)  General Interventions   General Interventions Discussed/Reviewed General Interventions Reviewed  Education Interventions   Education Provided Provided Education  Provided Verbal Education On Mental Health/Coping with Illness, Community Resources  [housing options discussed]  Mental Health Interventions   Mental Health Discussed/Reviewed Mental Health Reviewed, Coping Strategies, Anxiety  [solution focused,  problem solving]       Follow up plan: Follow up call scheduled for 05/22/23    Encounter Outcome:  Pt.  Visit Completed   Melody Hines, LCSW Social Work Care Coordination  [facility] Melody [name] [phone]

## 2023-05-07 NOTE — Patient Instructions (Signed)
Social Work Visit Information  Thank you for taking time to visit with me today. Please don't hesitate to contact me if I can be of assistance to you.   Following are the goals we discussed today:   Goals Addressed             This Visit's Progress    Care Coordination for community support       Activities and task to complete in order to accomplish goals.   Keep all upcoming appointment discussed today Continue with compliance of taking medication prescribed by Doctor I have placed a referral with [facility] someone from [facility] will be contacting you about the condition of your current home. keep therapy appointment with [facility]  Aug. 19th 11:00AM with Georgana Curio [phone]  this is an in person visit  Call Dr. Carie Caddy office to confirm appointment day and time  I contacted  [employer] [phone]  , they have a long wait list   [place] [phone]   housing authority          Our next appointment is by telephone on 05/22/23 at 3:30   Please call the care guide team at [phone] if you need to cancel or reschedule your appointment.   If you or anyone you know are experiencing a Mental Health or Behavioral Health Crisis or need someone to talk to, please call the Suicide and Crisis Lifeline: 988 call the [place] National Suicide Prevention Lifeline: 385 558 9811 or TTY: 5063483333 TTY 220-871-5924) to talk to a trained counselor call 1-800-273-TALK (toll free, 24 hour hotline) call the [facility]: 563-346-9191   Patient verbalizes understanding of instructions and care plan provided today and agrees to view in MyChart. Active MyChart status and patient understanding of how to access instructions and care plan via MyChart confirmed with patient.         Sammuel Hines, LCSW Social Work Care Coordination  [facility] [employer] [phone]

## 2023-05-08 ENCOUNTER — Ambulatory Visit: Payer: Self-pay

## 2023-05-13 NOTE — Patient Instructions (Signed)
Visit Information  Thank you for taking time to visit with me today. Please don't hesitate to contact me if I can be of assistance to you.   Following are the goals we discussed today:   Goals Addressed             This Visit's Progress    Housing       Care Coordination Interventions: Patient to complete paperwork for [employer] program for assistance with repairs to her home Patient to review online housing options on [email or web address] Patient to discuss with daughter current rental property and contact office for more details Patient to ask friends about other apartments in the area Patient to look for roommate/boarding room options         Our next appointment is by telephone on 05/14/23 at 1:30pm  Please call the care guide team at [phone] if you need to cancel or reschedule your appointment.   If you are experiencing a Mental Health or Behavioral Health Crisis or need someone to talk to, please call 911  Patient verbalizes understanding of instructions and care plan provided today and agrees to view in MyChart. Active MyChart status and patient understanding of how to access instructions and care plan via MyChart confirmed with patient.     Telephone follow up appointment with care management team member scheduled for:05/14/23 at 1:30pm  Lysle Morales, BSW Social Worker [facility] Care Management  [phone]

## 2023-05-13 NOTE — Patient Outreach (Signed)
  Care Coordination   Initial Visit Note Note for 05/08/23 entered late  05/13/2023 Name: Melody [name] MRN: [id number] DOB: 13-Apr-1956  TONEA LEIPHART is a 67 y.o. year old female who sees Plotnikov, Georgina Quint, MD for primary care. I spoke with  [name] by phone today.  What matters to the patients health and wellness today?  Patient was previously living in her home but relocated due to the many repairs needed.  Patient feels the home is too expensive to repair.  Patient currently lives with friends.  Patient only wants to live in [place] and [place].  Patients friends are not asking her to leave right now.  Patient receives $956 SSI/SSA and could afford rent $400-$500.       Goals Addressed             This Visit's Progress    Housing       Care Coordination Interventions: Patient to complete paperwork for [employer] program for assistance with repairs to her home Patient to review online housing options on [email or web address] Patient to discuss with daughter current rental property and contact office for more details Patient to ask friends about other apartments in the area Patient to look for roommate/boarding room options         SDOH assessments and interventions completed:  No     Care Coordination Interventions:  Yes, provided  Interventions Today    Flowsheet Row Most Recent Value  General Interventions   General Interventions Discussed/Reviewed General Interventions Discussed, [employer]  [SW provided online housing search for options.  SW will continue to look for boarding room options.]        Follow up plan: Follow up call scheduled for 05/14/23 at 1:30pm    Encounter Outcome:  Pt. Visit Completed

## 2023-05-14 ENCOUNTER — Ambulatory Visit: Payer: Self-pay

## 2023-05-14 NOTE — Patient Instructions (Signed)
Visit Information  Thank you for taking time to visit with me today. Please don't hesitate to contact me if I can be of assistance to you.   Following are the goals we discussed today:   Goals Addressed             This Visit's Progress    Housing       Care Coordination Interventions: Patient to complete paperwork for [employer] program for assistance with repairs to her home Patient to review online housing options on [email or web address] Patient to discuss with daughter current rental property and contact office for more details Patient to ask friends about other apartments in the area Patient to look for roommate/boarding room options   Care Coordination Interventions: Patient does not plan to discuss rental property with daughter, but instead will contact the property on her own Patient will contact [employer] to apply Patient will work on Scient[employer], forensic.  This will give patient options for repairs if she can return to the home or when she is prepared to sell the property.        Our next appointment is by telephone on 05/23/23 at 1pm  Please call the care guide team at [phone] if you need to cancel or reschedule your appointment.   If you are experiencing a Mental Health or Behavioral Health Crisis or need someone to talk to, please call 911  Patient verbalizes understanding of instructions and care plan provided today and agrees to view in MyChart. Active MyChart status and patient understanding of how to access instructions and care plan via MyChart confirmed with patient.     Telephone follow up appointment with care management team member scheduled for:05/23/23 at 1pm  Lysle Morales, BSW Social Worker [facility] Care Management  [phone]

## 2023-05-14 NOTE — Patient Outreach (Signed)
  Care Coordination   Follow Up Visit Note   05/14/2023 Name: PATRICIE GEESLIN MRN: [id number] DOB: February 01, 1956  AAIRA OESTREICHER is a 67 y.o. year old female who sees Plotnikov, Georgina Quint, MD for primary care. I spoke with  [name] by phone today.  What matters to the patients health and wellness today?  Patient has not located an option for housing.  Agreed to follow through with contacting resources for options.    Goals Addressed             This Visit's Progress    Housing       Care Coordination Interventions: Patient to complete paperwork for [employer] program for assistance with repairs to her home Patient to review online housing options on [email or web address] Patient to discuss with daughter current rental property and contact office for more details Patient to ask friends about other apartments in the area Patient to look for roommate/boarding room options   Care Coordination Interventions: Patient does not plan to discuss rental property with daughter, but instead will contact the property on her own Patient will contact [employer] to apply Patient will work on [employer], forensic.  This will give patient options for repairs if she can return to the home or when she is prepared to sell the property.        SDOH assessments and interventions completed:  No     Care Coordination Interventions:  Yes, provided  Interventions Today    Flowsheet Row Most Recent Value  General Interventions   General Interventions Discussed/Reviewed General Interventions Reviewed, Community Resources  [SW provided number to [place] [facility] and [email or web address].]       Follow up plan: Follow up call scheduled for 05/23/23 at 1pm    Encounter Outcome:  Pt. Visit Completed

## 2023-05-22 ENCOUNTER — Ambulatory Visit: Payer: Self-pay | Admitting: Licensed Clinical Social Worker

## 2023-05-22 NOTE — Patient Instructions (Signed)
  It was a pleasure speaking with you today. I am sorry you were unable to keep your phone appointment today.   I will call you next week per your request to reschedule your call   Sammuel Hines, LCSW Social Work Care Coordination  [phone]

## 2023-05-22 NOTE — Patient Outreach (Signed)
  Care Coordination  Follow Up Visit Note   05/22/2023 Name: Melody [name] MRN: [id number] DOB: [birth year]  Melody [name] is a 67 y.o. year old female who sees Plotnikov, Georgina Quint, MD for primary care. I spoke with  [name] by phone today.  What matters to the patients health and wellness today?  Patient states she is not feeling well today and would like to reschedule.  Requested that LCSW call her back one day next week  Reminded patient of scheduled phone appointment with BSW tomorrow.  SDOH assessments and interventions completed:  No   Care Coordination Interventions:  No, not indicated   Follow up plan:  F/u call was not scheduled during this encounter, will call patient in 5 to 7 days.    Encounter Outcome:  Pt. Visit Completed   Sammuel Hines, LCSW Social Work Care Coordination  [facility] [employer] [employer] [phone]

## 2023-05-23 ENCOUNTER — Ambulatory Visit: Payer: Self-pay

## 2023-05-23 NOTE — Patient Outreach (Signed)
  Care Coordination   05/23/2023 Name: DARLEENE CUMPIAN MRN: [id number] DOB: [birth year]   Care Coordination Outreach Attempts:  An unsuccessful telephone outreach was attempted for a scheduled appointment today.  Follow Up Plan:  Additional outreach attempts will be made to offer the patient care coordination information and services.   Encounter Outcome:  No Answer   Care Coordination Interventions:  No, not indicated    SIG Lysle Morales, BSW Social Worker [facility] Care Management  [phone]

## 2023-05-26 ENCOUNTER — Other Ambulatory Visit: Payer: Self-pay | Admitting: Internal Medicine

## 2023-05-29 ENCOUNTER — Encounter (INDEPENDENT_AMBULATORY_CARE_PROVIDER_SITE_OTHER): Payer: Self-pay

## 2023-06-07 ENCOUNTER — Ambulatory Visit: Payer: Self-pay

## 2023-06-07 NOTE — Patient Outreach (Signed)
  Care Coordination   Follow Up Visit Note   06/07/2023 Name: Melody [name] MRN: [id number] DOB: September 13, 1956  Melody [name] is a 67 y.o. year old female who sees Plotnikov, Melody Quint, MD for primary care. I spoke with  Melody [name] by phone today.  What matters to the patients health and wellness today?  Patient wants housing options.    Goals Addressed             This Visit's Progress    Housing       Care Coordination Interventions: Patient to complete paperwork for [employer] program for assistance with repairs to her home Patient to review online housing options on [email or web address] Patient to discuss with daughter current rental property and contact office for more details Patient to ask friends about other apartments in the area Patient to look for roommate/boarding room options   Care Coordination Interventions: Patient does not plan to discuss rental property with daughter, but instead will contact the property on her own Patient will contact [employer] to apply Patient will work on [employer], [employer].  This will give patient options for repairs if she can return to the home or when she is prepared to sell the property.  Care Coordination Interventions: 06/07/23 Housing-Patient has not followed through with contacting [employer].  SW conference called and left vm for [employer] to call either SW or patient.  Patient will inquire on [address] [employer], Senior Housing and if the application can be done by phone. MH-Patient reports the medication she is on now for mh makes her mad.  Patient has an appointment with the psychiatrist 8/13.  SW does encourage patient to address concerns during visit.          SDOH assessments and interventions completed:  No     Care Coordination Interventions:  Yes, provided   Interventions Today    Flowsheet Row Most Recent Value  General Interventions    General Interventions Discussed/Reviewed General Interventions Reviewed, Communication with  Communication with --  [T/c to [employer] and left message]  Mental Health Interventions   Mental Health Discussed/Reviewed Other  [Encouraged to speak to psychiatrist regarding medication concerns and feeling mad]        Follow up plan: Follow up call scheduled for 06/13/23 at 2:30    Encounter Outcome:  Pt. Visit Completed

## 2023-06-13 ENCOUNTER — Ambulatory Visit: Payer: Self-pay

## 2023-06-13 NOTE — Patient Outreach (Signed)
  Care Coordination   06/13/2023 Name: Melody [name] MRN: [id number] DOB: Dec 02, 1955   Care Coordination Outreach Attempts:  An unsuccessful telephone outreach was attempted for a scheduled appointment today.  Follow Up Plan:  Additional outreach attempts will be made to offer the patient care coordination information and services.   Encounter Outcome:  No Answer   Care Coordination Interventions:  No, not indicated    SIG Lysle Morales, BSW Social Worker [facility] Care Management  [phone]

## 2023-06-17 ENCOUNTER — Ambulatory Visit (HOSPITAL_COMMUNITY): Payer: 59 | Admitting: Psychiatry

## 2023-06-19 ENCOUNTER — Ambulatory Visit (INDEPENDENT_AMBULATORY_CARE_PROVIDER_SITE_OTHER): Payer: 59 | Admitting: Internal Medicine

## 2023-06-19 ENCOUNTER — Encounter: Payer: Self-pay | Admitting: Internal Medicine

## 2023-06-19 VITALS — BP 110/70 | HR 94 | Temp 98.6°F | Ht 66.0 in | Wt 134.0 lb

## 2023-06-19 DIAGNOSIS — F419 Anxiety disorder, unspecified: Secondary | ICD-10-CM | POA: Diagnosis not present

## 2023-06-19 DIAGNOSIS — F319 Bipolar disorder, unspecified: Secondary | ICD-10-CM

## 2023-06-19 DIAGNOSIS — E559 Vitamin D deficiency, unspecified: Secondary | ICD-10-CM | POA: Diagnosis not present

## 2023-06-19 DIAGNOSIS — E034 Atrophy of thyroid (acquired): Secondary | ICD-10-CM | POA: Diagnosis not present

## 2023-06-19 DIAGNOSIS — T7421XA Adult sexual abuse, confirmed, initial encounter: Secondary | ICD-10-CM

## 2023-06-19 DIAGNOSIS — E538 Deficiency of other specified B group vitamins: Secondary | ICD-10-CM | POA: Diagnosis not present
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
## 2023-06-19 NOTE — Assessment & Plan Note (Signed)
Chronic On Levothroid - 
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
## 2023-06-19 NOTE — Assessment & Plan Note (Signed)
On B12 
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
## 2023-06-19 NOTE — Assessment & Plan Note (Signed)
Continue with vitamin D 

## 2023-06-20 ENCOUNTER — Telehealth: Payer: Self-pay | Admitting: Internal Medicine

## 2023-06-20 ENCOUNTER — Ambulatory Visit: Payer: Self-pay | Admitting: Licensed Clinical Social Worker

## 2023-06-20 NOTE — Telephone Encounter (Signed)
Pt called stating she was in the office yesterday and Dr. Macario Golds prescribed a cough medicine which is not working for her pt wants to know if she can get an antibiotic or something to help her. Please advise.

## 2023-06-20 NOTE — Patient Outreach (Signed)
  Care Coordination  Follow Up Visit Note   06/20/2023 Name: Melody [name] MRN: [id number] DOB: Aug 28, 1956  Melody [name] is a 67 y.o. year old female who sees Plotnikov, Georgina Quint, MD for primary care. I spoke with  Melody [name] by phone today.  What matters to the patients health and wellness today?  Finding stable housing  Patient reports not feeling well.  She missed phone appointment with BSW and with therapist. No new clinical needs identified today. Patient will call therapist if she decides to reschedule her appointment.   Goals Addressed             This Visit's Progress    COMPLETED: Mental health & community support       Activities and task to complete in order to accomplish goals.   Keep all upcoming appointment discussed today Continue with compliance of taking medication prescribed by Doctor I am sorry you missed your therapy appointment with [facility]  on Aug. 19th 11:00AM with Georgana Curio  Please call if you would like to reschedule [phone]   Listed below is contact information if you would like to connect with your [facility] [phone] Per your request I have rescheduled your missed phone appointment with Mick Sell to explore housing options          SDOH assessments and interventions completed:  No  Care Coordination Interventions:  Yes, provided  Interventions Today    Flowsheet Row Most Recent Value  Chronic Disease   Chronic disease during today's visit Chronic Obstructive Pulmonary Disease (COPD)  General Interventions   General Interventions Discussed/Reviewed Communication with, [employer] continue to work with BSW to explore housing options]  Communication with Social Work  [BSW and rescheduled phone appointment]  Mental Health Interventions   Mental Health Discussed/Reviewed Mental Health Reviewed  [was no show for therapy appointment, has decided not to move forward at this  time]       Follow up plan: No further intervention required.   Encounter Outcome:  Pt. Visit Completed   Sammuel Hines, LCSW [place]  [facility], [facility] Health Licensed Clinical Social Work Care Coordinator  Direct Dial: [phone]

## 2023-06-20 NOTE — Patient Instructions (Signed)
Social Work Visit Information  Thank you for taking time to visit with me today. Please don't hesitate to contact me if I can be of assistance to you.   Following are the goals we discussed today:   Goals Addressed             This Visit's Progress    COMPLETED: Mental health & community support       Activities and task to complete in order to accomplish goals.   Keep all upcoming appointment discussed today Continue with compliance of taking medication prescribed by Doctor I am sorry you missed your therapy appointment with [facility]  on Aug. 19th 11:00AM with Georgana Curio  Please call if you would like to reschedule [phone]   Listed below is contact information if you would like to connect with your [facility] [phone] Per your request I have rescheduled your missed phone appointment with Mick Sell to explore housing options           Your next appointment is by telephone on 07/01/23 at 3:00 with [facility]   Please call the care guide team at [phone] if you need to cancel or reschedule your appointment.   If you or anyone you know are experiencing a Mental Health or Behavioral Health Crisis or need someone to talk to, please call the Suicide and Crisis Lifeline: 988 call the [place] National Suicide Prevention Lifeline: 330 537 8123 or TTY: 541-537-9406 TTY 863-618-9937) to talk to a trained counselor call 1-800-273-TALK (toll free, 24 hour hotline) call the [facility]: 972 037 8495   Patient verbalizes understanding of instructions and care plan provided today and agrees to view in MyChart. Active MyChart status and patient understanding of how to access instructions and care plan via MyChart confirmed with patient.       Sammuel Hines, LCSW [place]  [facility], [facility] Health Licensed Clinical Social Work Care Coordinator  Direct Dial: [phone]

## 2023-06-21 MED ORDER — AZITHROMYCIN 250 MG PO TABS: ORAL_TABLET | ORAL | 0 refills | Status: DC

## 2023-06-21 NOTE — Telephone Encounter (Signed)
Notified pt MD sent rx to [facility] 

## 2023-06-21 NOTE — Telephone Encounter (Signed)
Okay Z-Pak.  Thank you

## 2023-06-24 ENCOUNTER — Other Ambulatory Visit: Payer: Self-pay | Admitting: Internal Medicine

## 2023-06-25 ENCOUNTER — Telehealth: Payer: Self-pay

## 2023-06-25 ENCOUNTER — Telehealth: Payer: 59 | Admitting: Family Medicine

## 2023-06-25 DIAGNOSIS — B370 Candidal stomatitis: Secondary | ICD-10-CM | POA: Diagnosis not present

## 2023-06-25 DIAGNOSIS — J9801 Acute bronchospasm: Secondary | ICD-10-CM | POA: Diagnosis not present

## 2023-06-25 DIAGNOSIS — J209 Acute bronchitis, unspecified: Secondary | ICD-10-CM | POA: Diagnosis not present

## 2023-06-25 DIAGNOSIS — R059 Cough, unspecified: Secondary | ICD-10-CM | POA: Diagnosis not present

## 2023-06-25 DIAGNOSIS — R0600 Dyspnea, unspecified: Secondary | ICD-10-CM

## 2023-06-25 DIAGNOSIS — Z6821 Body mass index (BMI) 21.0-21.9, adult: Secondary | ICD-10-CM | POA: Diagnosis not present

## 2023-06-25 DIAGNOSIS — R051 Acute cough: Secondary | ICD-10-CM | POA: Diagnosis not present

## 2023-06-25 DIAGNOSIS — U071 COVID-19: Secondary | ICD-10-CM | POA: Diagnosis not present

## 2023-06-25 NOTE — Patient Instructions (Signed)
Go to the urgent care right away today. If you are having any worsening of symptoms or any severe or life threatening symptoms call 911 right away.    It was nice to meet you today. I help  out with telemedicine visits on Tuesdays and Thursdays and am happy to help if you need a virtual follow up visit on those days. Otherwise, if you have any concerns or questions following this visit please schedule a follow up visit with your Primary Care office or seek care at a local urgent care clinic to avoid delays in care. If you are having severe or life threatening symptoms please call 911 and/or go to the nearest emergency room.

## 2023-06-25 NOTE — Telephone Encounter (Signed)
Called patient per Dr. Selena Batten to follow-up on possible Covid tele health visit, patient is taking a at home covid test, patient refuses to go to the hospital or urgent care as the Dr.Kim suggested.

## 2023-06-25 NOTE — Progress Notes (Signed)
Virtual Visit via Video Note  I connected with Melody [name]  on 06/25/23 at 12:00 PM EDT by a video enabled telemedicine application and verified that I am speaking with the correct person using two identifiers.  Location patient: [place] Location provider:work or home office Persons participating in the virtual visit: patient, provider  I discussed the limitations and requested verbal permission for telemedicine visit. The patient expressed understanding and agreed to proceed.   HPI:  Acute telemedicine visit for upper respiratory issues: -Onset: 6- 7 days ago -Symptoms include: cough, nasal congestion mild, on zpack but worsening with SOB and chest discomfort -Denies:fever, change from baseline on body ache -Pertinent past medical history: see below -Pertinent medication allergies: Allergies  Allergen Reactions   Flagyl [Metronidazole] Shortness Of Breath    Made tongue turn dark red, was not able to breath.   Doxycycline Hyclate Itching   Dulera [Mometasone Furo-Formoterol Fum]     thrush   Gabapentin     Headaches: Patient is not aware of the reaction to this medication   Nabumetone Other (See Comments)    Headache   Sulfa Antibiotics Other (See Comments)    Reaction is unknown   Tetracycline Hcl Itching   Tramadol Hcl Other (See Comments)   -COVID-19 vaccine status:  Immunization History  Administered Date(s) Administered   Fluad Quad(high Dose 65+) 08/31/2020, 07/27/2022   Influenza Split 08/05/2012   Influenza Whole 09/01/2008, 09/02/2009   Influenza, High Dose Seasonal PF 09/05/2021   Influenza,inj,Quad PF,6+ Mos 08/10/2016, 08/23/2017, 08/06/2018, 06/24/2019   Influenza,inj,quad, With Preservative 08/29/2020   Influenza-Unspecified 08/29/2013, 07/30/2014, 08/01/2015   Moderna Sars-Covid-2 Vaccination 04/04/2020, 05/02/2020   Pneumococcal Conjugate-13 08/01/2015   Pneumococcal Polysaccharide-23 05/16/2018   Respiratory Syncytial Virus Vaccine,Recomb Aduvanted(Arexvy)  07/27/2022   Tdap 08/01/2015   Zoster, Live 08/01/2015     ROS: See pertinent positives and negatives per HPI.  Past Medical History:  Diagnosis Date   Anxiety    Bronchiectasis (HCC)    Chronic back pain    Chronic neck pain    Depression    bipolar- Dr Evelene Croon   Fibromyalgia    GERD (gastroesophageal reflux disease)    Headache    Hypertension    Hypothyroidism    Low back pain    Dr Lovell Sheehan   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[facility])    Osteoarthritis    Pneumonia    Pulmonary nodule    Vertigo    Vitamin B12 deficiency     Past Surgical History:  Procedure Laterality Date   ABDOMINAL HYSTERECTOMY N/A 01/10/2016   Procedure: HYSTERECTOMY ABDOMINAL;  Surgeon: Patton Salles, MD;  Location: [facility];  Service: Gynecology;  Laterality: N/A;   ABDOMINAL SACROCOLPOPEXY N/A 01/10/2016   Procedure: ABDOMINO SACROCOLPOPEXY ;  Surgeon: Patton Salles, MD;  Location: [facility];  Service: Gynecology;  Laterality: N/A;   ANTERIOR AND POSTERIOR REPAIR N/A 01/10/2016   Procedure:  POSTERIOR REPAIR (RECTOCELE);  Surgeon: Patton Salles, MD;  Location: [facility];  Service: Gynecology;  Laterality: N/A;   BIOPSY  10/19/2022   Procedure: BIOPSY;  Surgeon: Dolores Frame, MD;  [facility]: [facility];  Service: Gastroenterology;;   BLADDER SUSPENSION N/A 01/10/2016   Procedure: TRANSVAGINAL TAPE (TVT) PROCEDURE exact midurethral sling;  Surgeon: Patton Salles, MD;  Location: [facility];  Service: Gynecology;  Laterality: N/A;   CERVICAL LAMINECTOMY  2001 and 2995   Dr Lovell Sheehan   COLONOSCOPY WITH PROPOFOL N/A 10/19/2022  Procedure: COLONOSCOPY WITH PROPOFOL;  Surgeon: Dolores Frame, MD;  [facility]: [facility];  Service: Gastroenterology;  Laterality: N/A;  10:30am, asa 1-2   CYSTO N/A 01/10/2016   Procedure: CYSTOSCOPY;  Surgeon: Patton Salles, MD;  Location: [facility];  Service: Gynecology;  Laterality: N/A;   ESOPHAGEAL  DILATION N/A 10/19/2022   Procedure: ESOPHAGEAL DILATION;  Surgeon: Dolores Frame, MD;  [facility];  Service: Gastroenterology;  Laterality: N/A;   ESOPHAGOGASTRODUODENOSCOPY (EGD) WITH PROPOFOL N/A 10/19/2022   Procedure: ESOPHAGOGASTRODUODENOSCOPY (EGD) WITH PROPOFOL;  Surgeon: Dolores Frame, MD;  [facility];  Service: Gastroenterology;  Laterality: N/A;   POLYPECTOMY  10/19/2022   Procedure: POLYPECTOMY INTESTINAL;  Surgeon: Dolores Frame, MD;  [facility];  Service: Gastroenterology;;   SALPINGOOPHORECTOMY Bilateral 01/10/2016   Procedure: BILATERAL SALPINGO OOPHORECTOMY;  Surgeon: Patton Salles, MD;  Location: [facility];  Service: Gynecology;  Laterality: Bilateral;   SAVORY DILATION  10/19/2022   Procedure: SAVORY DILATION;  Surgeon: Marguerita Merles, Reuel Boom, MD;  [facility]: [facility];  Service: Gastroenterology;;   TUBAL LIGATION  1980     Current Outpatient Medications:    albuterol (VENTOLIN HFA) 108 (90 Base) MCG/ACT inhaler, INHALE TWO PUFFS BY MOUTH EVERY 4 HOURS AS NEEDED FOR wheezing OR SHORTNESS OF BREATH, Disp: 8.5 g, Rfl: 5   ALPRAZolam (XANAX) 1 MG tablet, Take 1 mg by mouth 3 (three) times daily., Disp: , Rfl:    azithromycin (ZITHROMAX Z-PAK) 250 MG tablet, As directed, Disp: 6 tablet, Rfl: 0   cephALEXin (KEFLEX) 500 MG capsule, Take 2 capsules (1,000 mg total) by mouth 2 (two) times daily., Disp: 20 capsule, Rfl: 0   Cholecalciferol (VITAMIN D3) 1.25 MG (50000 UT) CAPS, TAKE ONE CAPSULE BY MOUTH EVERY 14 DAYS, Disp: 6 capsule, Rfl: 3   cyanocobalamin (VITAMIN B12) 1000 MCG/ML injection, INJECT INTO THE SKIN EVERY 14 DAYS, Disp: 6 mL, Rfl: 3   diclofenac Sodium (VOLTAREN) 1 % GEL, APPLY TWO GRAMS TO AFFECTED AREA(S) FOUR TIMES DAILY (Patient taking differently: Apply 2 g topically 4 (four) times daily as needed (shoulder pain).), Disp: 200 g, Rfl: 3   fluticasone (FLONASE) 50 MCG/ACT  nasal spray, INSTILL TWO SPRAYS IN EACH NOSTRIL DAILY, Disp: 48 g, Rfl: 3   guaiFENesin-dextromethorphan (ROBITUSSIN DM) 100-10 MG/5ML syrup, Take 5 mLs by mouth every 4 (four) hours as needed for cough., Disp: 118 mL, Rfl: 0   levocetirizine (XYZAL) 5 MG tablet, TAKE 1 TABLET BY MOUTH EVERY EVENING (FOR ALLERGY) (Patient taking differently: Take 5 mg by mouth daily as needed for allergies.), Disp: 90 tablet, Rfl: 3   levofloxacin (LEVAQUIN) 500 MG tablet, Take 1 tablet (500 mg total) by mouth daily., Disp: 7 tablet, Rfl: 0   levothyroxine (SYNTHROID) 50 MCG tablet, TAKE 1 TABLET BY MOUTH DAILY BEFORE BREAKFAST, Disp: 90 tablet, Rfl: 2   lidocaine (LIDODERM) 5 %, Place 1-2 patches onto the skin daily. Remove & Discard patch within 12 hours or as directed by MD (Patient taking differently: Place 1-2 patches onto the skin daily as needed (pain). Remove & Discard patch within 12 hours or as directed by MD), Disp: 60 patch, Rfl: 3   losartan (COZAAR) 25 MG tablet, TAKE 1 TABLET BY MOUTH DAILY, Disp: 30 tablet, Rfl: 5   magnesium oxide (MAG-OX) 400 (240 Mg) MG tablet, TAKE 1 TABLET BY MOUTH ONCE daily AT NOON, Disp: 30 tablet, Rfl: 3   methocarbamol (ROBAXIN) 500  MG tablet, Take 1 tablet (500 mg total) by mouth 3 (three) times daily as needed for muscle spasms., Disp: 30 tablet, Rfl: 0   methylphenidate (RITALIN) 20 MG tablet, Take 1 tablet (20 mg total) by mouth daily., Disp: , Rfl: 0   omeprazole (PRILOSEC) 40 MG capsule, TAKE ONE CAPSULE BY MOUTH TWICE DAILY, Disp: 60 capsule, Rfl: 5   oxybutynin (DITROPAN-XL) 10 MG 24 hr tablet, Take 1 tablet (10 mg total) by mouth at bedtime. Follow-up appt due in Nov must see provider for future refills, Disp: 30 tablet, Rfl: 5   oxyCODONE-acetaminophen (PERCOCET/ROXICET) 5-325 MG tablet, TAKE 1 TABLET BY MOUTH EVERY 8 HOURS AS NEEDED FOR SEVERE PAIN, Disp: 60 tablet, Rfl: 0   potassium chloride SA (KLOR-CON M) 20 MEQ tablet, TAKE 1 TABLET BY MOUTH ONCE DAILY AT NOON,  Disp: 30 tablet, Rfl: 3   promethazine (PHENERGAN) 25 MG tablet, TAKE 1/2 TO 1 TABLET BY MOUTH EVERY 8 HOURS AS NEEDED FOR NAUSEA AND VOMITING, Disp: 20 tablet, Rfl: 1   QUEtiapine (SEROQUEL) 25 MG tablet, Take 25 mg by mouth at bedtime., Disp: , Rfl:    rizatriptan (MAXALT) 10 MG tablet, Take 1 tablet (10 mg total) by mouth once as needed for up to 1 dose for migraine. May repeat in 2 hours if needed, Disp: 12 tablet, Rfl: 5   Tiotropium Bromide Monohydrate (SPIRIVA RESPIMAT) 2.5 MCG/ACT AERS, Inhale 1 each into the lungs daily., Disp: , Rfl:    triamcinolone ointment (KENALOG) 0.1 %, Apply topically 2 (two) times daily as needed. to affected area(s), Disp: 80 g, Rfl: 3   Vilazodone HCl (VIIBRYD) 10 MG TABS, Take 10 mg by mouth daily., Disp: , Rfl:   EXAM:  VITALS per patient if applicable:  GENERAL: alert, oriented  HEENT: atraumatic, conjunttiva clear, no obvious abnormalities on inspection of external nose and ears  NECK: normal movements of the head and neck  LUNGS: on inspection coughing at times, no tripoding or signs of significant respiratory distress  CV: no obvious cyanosis  MS: moves all visible extremities without noticeable abnormality  PSYCH/NEURO: pleasant and cooperative, no obvious depression or anxiety, speech and thought processing grossly intact  ASSESSMENT AND PLAN:  Discussed the following assessment and plan:  Acute cough  Dyspnea, unspecified type  -we discussed possible serious and likely etiologies, options for evaluation and workup, limitations of telemedicine visit vs in person visit, treatment, treatment risks and precautions. Pt is agreeable to treatment via telemedicine at this moment. However, given reported symptoms, worsening despite abx, advised inperson evaluation at higher level of care. She adamantly does not wish to go to the hospital and she was unable to get inperson visit with PCP. Advised UCC and she says there are two options near her  house and she agrees to go. Advised her to go right away today. Advised nursing staff to check on her in one hour by phone.   I discussed the assessment and treatment plan with the patient. The patient was provided an opportunity to ask questions and all were answered. The patient agreed with the plan and demonstrated an understanding of the instructions.     Terressa Koyanagi, DO

## 2023-06-25 NOTE — Progress Notes (Signed)
Patient was unable to obtain vitals today.

## 2023-07-03 ENCOUNTER — Ambulatory Visit: Payer: Self-pay

## 2023-07-03 NOTE — Patient Outreach (Signed)
  Care Coordination   07/03/2023 Name: Melody [name] MRN: [id number] DOB: [birth year]   Care Coordination Outreach Attempts:  An unsuccessful telephone outreach was attempted for a scheduled appointment today.  Follow Up Plan:  Additional outreach attempts will be made to offer the patient care coordination information and services.   Encounter Outcome:  No Answer   Care Coordination Interventions:  No, not indicated     Lysle Morales, BSW Social Worker [phone]

## 2023-07-22 ENCOUNTER — Other Ambulatory Visit: Payer: Self-pay | Admitting: Internal Medicine

## 2023-07-24 ENCOUNTER — Other Ambulatory Visit: Payer: Self-pay | Admitting: Internal Medicine

## 2023-07-30 ENCOUNTER — Telehealth: Payer: Self-pay | Admitting: Internal Medicine

## 2023-07-30 DIAGNOSIS — N3000 Acute cystitis without hematuria: Secondary | ICD-10-CM
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
## 2023-08-01 NOTE — Telephone Encounter (Signed)
Okay to do labs at [facility]. Add urine culture.  Thanks

## 2023-08-01 NOTE — Telephone Encounter (Signed)
I was able to inform pt of provider response and she states understanding. Ucx has been ordered as well.

## 2023-08-06 ENCOUNTER — Other Ambulatory Visit (INDEPENDENT_AMBULATORY_CARE_PROVIDER_SITE_OTHER): Payer: 59

## 2023-08-06 DIAGNOSIS — T7421XA Adult sexual abuse, confirmed, initial encounter: Secondary | ICD-10-CM

## 2023-08-06 DIAGNOSIS — E559 Vitamin D deficiency, unspecified: Secondary | ICD-10-CM | POA: Diagnosis not present

## 2023-08-06 DIAGNOSIS — N3000 Acute cystitis without hematuria: Secondary | ICD-10-CM | POA: Diagnosis not present

## 2023-08-06 DIAGNOSIS — F419 Anxiety disorder, unspecified: Secondary | ICD-10-CM | POA: Diagnosis not present

## 2023-08-06 DIAGNOSIS — E538 Deficiency of other specified B group vitamins: Secondary | ICD-10-CM | POA: Diagnosis not present

## 2023-08-06 LAB — CBC WITH DIFFERENTIAL/PLATELET
Basophils Absolute: 0.1 10*3/uL (ref 0.0–0.1)
Basophils Relative: 1.1 % (ref 0.0–3.0)
Eosinophils Absolute: 0.1 10*3/uL (ref 0.0–0.7)
Eosinophils Relative: 1.1 % (ref 0.0–5.0)
HCT: 39.1 % (ref 36.0–46.0)
Hemoglobin: 12.8 g/dL (ref 12.0–15.0)
Lymphocytes Relative: 26.5 % (ref 12.0–46.0)
Lymphs Abs: 1.4 10*3/uL (ref 0.7–4.0)
MCHC: 32.6 g/dL (ref 30.0–36.0)
MCV: 86.7 fL (ref 78.0–100.0)
Monocytes Absolute: 0.4 10*3/uL (ref 0.1–1.0)
Monocytes Relative: 7.6 % (ref 3.0–12.0)
Neutro Abs: 3.4 10*3/uL (ref 1.4–7.7)
Neutrophils Relative %: 63.7 % (ref 43.0–77.0)
Platelets: 193 10*3/uL (ref 150.0–400.0)
RBC: 4.5 Mil/uL (ref 3.87–5.11)
RDW: 14.4 % (ref 11.5–15.5)
WBC: 5.3 10*3/uL (ref 4.0–10.5)

## 2023-08-06 LAB — VITAMIN B12: Vitamin B-12: 448 pg/mL (ref 211–911)

## 2023-08-06 LAB — COMPREHENSIVE METABOLIC PANEL
ALT: 5 U/L (ref 0–35)
AST: 9 U/L (ref 0–37)
Albumin: 4 g/dL (ref 3.5–5.2)
Alkaline Phosphatase: 75 U/L (ref 39–117)
BUN: 18 mg/dL (ref 6–23)
CO2: 27 meq/L (ref 19–32)
Calcium: 9.6 mg/dL (ref 8.4–10.5)
Chloride: 104 meq/L (ref 96–112)
Creatinine, Ser: 0.89 mg/dL (ref 0.40–1.20)
GFR: 66.48 mL/min (ref >60.00)
Glucose, Bld: 94 mg/dL (ref 70–99)
Potassium: 4.9 meq/L (ref 3.5–5.1)
Sodium: 138 meq/L (ref 135–145)
Total Bilirubin: 0.7 mg/dL (ref 0.2–1.2)
Total Protein: 6.4 g/dL (ref 6.0–8.3)

## 2023-08-06 LAB — VITAMIN D 25 HYDROXY (VIT D DEFICIENCY, FRACTURES): VITD: 97.65 ng/mL (ref 30.00–100.00)

## 2023-08-06 LAB — T4, FREE: Free T4: 0.95 ng/dL (ref 0.60–1.60)

## 2023-08-07 LAB — URINE CULTURE
MICRO NUMBER:: 15566920
SPECIMEN QUALITY:: ADEQUATE

## 2023-08-07 LAB — HIV ANTIBODY (ROUTINE TESTING W REFLEX): HIV 1&2 Ab, 4th Generation: NONREACTIVE

## 2023-08-13 ENCOUNTER — Other Ambulatory Visit: Payer: Self-pay | Admitting: Internal Medicine

## 2023-08-23 ENCOUNTER — Ambulatory Visit (INDEPENDENT_AMBULATORY_CARE_PROVIDER_SITE_OTHER): Payer: 59 | Admitting: Internal Medicine

## 2023-08-23 ENCOUNTER — Ambulatory Visit (INDEPENDENT_AMBULATORY_CARE_PROVIDER_SITE_OTHER): Payer: 59

## 2023-08-23 ENCOUNTER — Other Ambulatory Visit: Payer: 59

## 2023-08-23 ENCOUNTER — Encounter: Payer: Self-pay | Admitting: Internal Medicine

## 2023-08-23 VITALS — BP 116/70 | HR 78 | Temp 98.1°F | Ht 66.0 in | Wt 129.4 lb

## 2023-08-23 DIAGNOSIS — R634 Abnormal weight loss: Secondary | ICD-10-CM

## 2023-08-23 DIAGNOSIS — R1013 Epigastric pain: Secondary | ICD-10-CM

## 2023-08-23 DIAGNOSIS — E538 Deficiency of other specified B group vitamins: Secondary | ICD-10-CM | POA: Diagnosis not present

## 2023-08-23 DIAGNOSIS — M25551 Pain in right hip: Secondary | ICD-10-CM | POA: Diagnosis not present

## 2023-08-23 DIAGNOSIS — M160 Bilateral primary osteoarthritis of hip: Secondary | ICD-10-CM | POA: Diagnosis not present

## 2023-08-23 DIAGNOSIS — K8020 Calculus of gallbladder without cholecystitis without obstruction: Secondary | ICD-10-CM

## 2023-08-23 DIAGNOSIS — J479 Bronchiectasis, uncomplicated: Secondary | ICD-10-CM

## 2023-08-23 DIAGNOSIS — R112 Nausea with vomiting, unspecified: Secondary | ICD-10-CM

## 2023-08-23 DIAGNOSIS — G8929 Other chronic pain: Secondary | ICD-10-CM

## 2023-08-23 DIAGNOSIS — M5440 Lumbago with sciatica, unspecified side: Secondary | ICD-10-CM | POA: Diagnosis not present

## 2023-08-23 LAB — URINALYSIS, ROUTINE W REFLEX MICROSCOPIC
Ketones, ur: 15 — AB
Nitrite: NEGATIVE
Specific Gravity, Urine: >=1.03 — AB (ref 1.000–1.030)
Total Protein, Urine: NEGATIVE
Urine Glucose: NEGATIVE
Urobilinogen, UA: 0.2 (ref 0.0–1.0)
pH: 6 (ref 5.0–8.0)

## 2023-08-23 LAB — COMPREHENSIVE METABOLIC PANEL
ALT: 6 U/L (ref 0–35)
AST: 12 U/L (ref 0–37)
Albumin: 4.4 g/dL (ref 3.5–5.2)
Alkaline Phosphatase: 85 U/L (ref 39–117)
BUN: 21 mg/dL (ref 6–23)
CO2: 28 meq/L (ref 19–32)
Calcium: 9.7 mg/dL (ref 8.4–10.5)
Chloride: 102 meq/L (ref 96–112)
Creatinine, Ser: 0.87 mg/dL (ref 0.40–1.20)
GFR: 68.3 mL/min (ref >60.00)
Glucose, Bld: 97 mg/dL (ref 70–99)
Potassium: 4.1 meq/L (ref 3.5–5.1)
Sodium: 138 meq/L (ref 135–145)
Total Bilirubin: 1 mg/dL (ref 0.2–1.2)
Total Protein: 7.4 g/dL (ref 6.0–8.3)

## 2023-08-23 LAB — CBC WITH DIFFERENTIAL/PLATELET
Basophils Absolute: 0.1 10*3/uL (ref 0.0–0.1)
Basophils Relative: 0.9 % (ref 0.0–3.0)
Eosinophils Absolute: 0 10*3/uL (ref 0.0–0.7)
Eosinophils Relative: 0.6 % (ref 0.0–5.0)
HCT: 42.3 % (ref 36.0–46.0)
Hemoglobin: 13.6 g/dL (ref 12.0–15.0)
Lymphocytes Relative: 23.9 % (ref 12.0–46.0)
Lymphs Abs: 1.8 10*3/uL (ref 0.7–4.0)
MCHC: 32.2 g/dL (ref 30.0–36.0)
MCV: 87.2 fL (ref 78.0–100.0)
Monocytes Absolute: 0.5 10*3/uL (ref 0.1–1.0)
Monocytes Relative: 6.6 % (ref 3.0–12.0)
Neutro Abs: 5.1 10*3/uL (ref 1.4–7.7)
Neutrophils Relative %: 68 % (ref 43.0–77.0)
Platelets: 223 10*3/uL (ref 150.0–400.0)
RBC: 4.85 Mil/uL (ref 3.87–5.11)
RDW: 14.1 % (ref 11.5–15.5)
WBC: 7.5 10*3/uL (ref 4.0–10.5)

## 2023-08-23 LAB — SEDIMENTATION RATE: Sed Rate: 19 mm/h (ref 0–30)

## 2023-08-23 LAB — LIPASE: Lipase: 9 U/L — ABNORMAL LOW (ref 11.0–59.0)

## 2023-08-23 MED ORDER — ONDANSETRON HCL 4 MG PO TABS: 4.0000 mg | ORAL_TABLET | Freq: Three times a day (TID) | ORAL | 0 refills | Status: DC | PRN

## 2023-08-23 MED ORDER — PANTOPRAZOLE SODIUM 40 MG PO TBEC: 40.0000 mg | DELAYED_RELEASE_TABLET | Freq: Two times a day (BID) | ORAL | 5 refills | Status: DC

## 2023-08-23 NOTE — Patient Instructions (Signed)
Blue-Emu cream -- use 2-3 times a day ? ?
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
## 2023-08-23 NOTE — Assessment & Plan Note (Signed)
Labs Abd US 

## 2023-08-25 ENCOUNTER — Other Ambulatory Visit (INDEPENDENT_AMBULATORY_CARE_PROVIDER_SITE_OTHER): Payer: Self-pay | Admitting: Gastroenterology

## 2023-08-26 ENCOUNTER — Other Ambulatory Visit: Payer: Self-pay | Admitting: Internal Medicine

## 2023-08-26 LAB — TSH: TSH: 2.4 u[IU]/mL (ref 0.35–5.50)

## 2023-08-26 LAB — T4, FREE: Free T4: 1.02 ng/dL (ref 0.60–1.60)

## 2023-08-28 ENCOUNTER — Encounter: Payer: Self-pay | Admitting: Internal Medicine

## 2023-08-28 DIAGNOSIS — J479 Bronchiectasis, uncomplicated: Secondary | ICD-10-CM | POA: Insufficient documentation

## 2023-08-28 NOTE — Assessment & Plan Note (Signed)
Seems to be stable at present.

## 2023-08-28 NOTE — Assessment & Plan Note (Signed)
Recurrent Percocet prn  Potential benefits of a long term opioids use as well as potential risks (i.e. addiction risk, apnea etc) and complications (i.e. Somnolence, constipation and others) were explained to the patient and were aknowledged.  Potential benefits of a long term NSAID  use as well as potential risks  and complications were explained to the patient and were aknowledged.

## 2023-08-28 NOTE — Assessment & Plan Note (Addendum)
Epigastric abdominal pain, indigestion.  Likely related to GERD.  Treat GERD with pantoprazole GI consultation if not better Zofran as needed for nausea Take Protonix

## 2023-09-02 ENCOUNTER — Ambulatory Visit
Admission: RE | Admit: 2023-09-02 | Discharge: 2023-09-02 | Disposition: A | Discharge status: Home / Self Care | Payer: 59 | Source: Ambulatory Visit | Attending: Internal Medicine | Admitting: Internal Medicine

## 2023-09-02 DIAGNOSIS — K8020 Calculus of gallbladder without cholecystitis without obstruction: Secondary | ICD-10-CM | POA: Diagnosis not present

## 2023-09-02 DIAGNOSIS — R112 Nausea with vomiting, unspecified: Secondary | ICD-10-CM | POA: Diagnosis not present

## 2023-09-02 DIAGNOSIS — R1013 Epigastric pain: Secondary | ICD-10-CM | POA: Diagnosis not present

## 2023-09-03 DIAGNOSIS — K8020 Calculus of gallbladder without cholecystitis without obstruction: Secondary | ICD-10-CM | POA: Insufficient documentation

## 2023-09-03 DIAGNOSIS — R112 Nausea with vomiting, unspecified: Secondary | ICD-10-CM | POA: Insufficient documentation

## 2023-09-03 NOTE — Assessment & Plan Note (Signed)
Symptomatic.  General Surgery referral-Dr. Fredricka Bonine

## 2023-09-03 NOTE — Addendum Note (Signed)
Addended by: Tresa Garter on: 09/03/2023 12:15 AM   Modules accepted: Orders

## 2023-09-05 ENCOUNTER — Other Ambulatory Visit (HOSPITAL_COMMUNITY): Payer: Self-pay

## 2023-09-12 ENCOUNTER — Telehealth: Payer: Self-pay | Admitting: Internal Medicine

## 2023-09-12 NOTE — Telephone Encounter (Signed)
Joella called from [facility] stating Pt pain level is a 10 and complaining that her tail bone, Knee, and leg is hurting. Pt is feeling depress and has no interest of wanting to do anything. Please advise.

## 2023-09-13 ENCOUNTER — Ambulatory Visit: Payer: Self-pay | Admitting: Surgery

## 2023-09-13 DIAGNOSIS — K8050 Calculus of bile duct without cholangitis or cholecystitis without obstruction: Secondary | ICD-10-CM | POA: Diagnosis not present

## 2023-09-13 NOTE — H&P (Signed)
Melody [name] [id number]   Referring Provider:  Tresa Garter, MD   Subjective   Chief Complaint: New Consultation (Calculus of gallbladder without cholecystitis without obstruction)     History of Present Illness:    Very pleasant 67 year old woman with history of anxiety, bronchiectasis, chronic back and neck pain, bipolar depression, fibromyalgia, GERD, headache, hypertension, hypothyroidism, Mycobacterium avium intracellulare, arthritis, vitamin B12 deficiency and vertigo who presents for evaluation of gallstones.  Reports a nearly 6-month history of weight loss, epigastric pain and right upper quadrant pain, indigestion nausea/vomiting after meals, increased acid in her stomach. She had an ultrasound done on 11/4 noting multiple small gallstones without sonographic evidence of cholecystitis.  Common bile duct diameter 2 mm, liver normal.  She had lab work done at the end of October including an unremarkable CMP and CBC Previous abdominal surgery includes abdominal hysterectomy/BSO with sacrocolpopexy and anterior and posterior repair/bladder suspension in 2016/10/05.  Tubal ligation many years prior.   Former smoker, 48.5 pack years, quit in 10-06-2011 Her mother passed away from stomach cancer and so she is understandably anxious about that possibility as well.  She reports that she does have a hiatal hernia.  Review of Systems: A complete review of systems was obtained from the patient.  I have reviewed this information and discussed as appropriate with the patient.  See HPI as well for other ROS.   Medical History: Past Medical History:  Diagnosis Date   Anemia    Anxiety    Arthritis    COPD (chronic obstructive pulmonary disease) (CMS/HHS-HCC)    Esophageal varices (CMS/HHS-HCC)    GERD (gastroesophageal reflux disease)    Thyroid disease     There is no problem list on file for this patient.   Past Surgical History:  Procedure Laterality Date   HYSTERECTOMY        Allergies  Allergen Reactions   Metronidazole Other (See Comments), Shortness Of Breath and Swelling    Made tongue turn dark red, was not able to breath.   Doxycycline Itching   Gabapentin Other (See Comments)    Headaches: Patient is not aware of the reaction to this medication   Mometasone-Formoterol Other (See Comments)    thrush   Nabumetone Headache and Other (See Comments)    Headache   Sulfa (Sulfonamide Antibiotics) Unknown    Reaction is unknown   Tetracycline Itching   Tramadol Other (See Comments)    Current Outpatient Medications on File Prior to Visit  Medication Sig Dispense Refill   ALPRAZolam (XANAX) 1 MG tablet TAKE 1 TABLET BY MOUTH THREE TO FOUR TIMES DAILY AS NEEDED     cholecalciferol (VITAMIN D3) 1,250 mcg (50,000 unit) capsule TAKE ONE CAPSULE BY MOUTH EVERY 14 DAYS     cyanocobalamin (VITAMIN B12) 1,000 mcg/mL injection INJECT UNDER THE SKIN EVERY 14 DAYS     levothyroxine (SYNTHROID) 50 MCG tablet Take 1 tablet by mouth every morning before breakfast     losartan (COZAAR) 25 MG tablet Take 25 mg by mouth once daily     magnesium oxide (MAG-OX) 400 mg (241.3 mg magnesium) tablet TAKE 1 TABLET BY MOUTH ONCE daily AT NOON     methylphenidate HCl (RITALIN) 20 MG tablet TAKE 1 TABLET BY MOUTH in THE morning AND ONE AT NOON     omeprazole (PRILOSEC) 40 MG DR capsule TAKE ONE CAPSULE BY MOUTH EVERY MORNING and TAKE ONE CAPSULE EVERY EVENING     oxyBUTYnin (DITROPAN-XL) 10 MG XL tablet Take 10  mg by mouth at bedtime     oxyCODONE-acetaminophen (PERCOCET) 5-325 mg tablet TAKE 1 TABLET BY MOUTH EVERY 8 HOURS AS NEEDED FOR SEVERE PAIN     pantoprazole (PROTONIX) 40 MG DR tablet Take 1 tablet by mouth once daily     potassium chloride (KLOR-CON M20) 20 MEQ ER tablet TAKE 1 TABLET BY MOUTH ONCE DAILY AT NOON     SPIRIVA RESPIMAT 2.5 mcg/actuation inhalation spray INHALE TWO PUFFS BY MOUTH EVERY DAY     vilazodone (VIIBRYD) 10 mg tablet Take 10 mg by mouth once  daily     No current facility-administered medications on file prior to visit.    Family History  Problem Relation Age of Onset   Colon cancer Mother    High blood pressure (Hypertension) Father    Coronary Artery Disease (Blocked arteries around heart) Father      Social History   Tobacco Use  Smoking Status Former   Types: Cigarettes  Smokeless Tobacco Never     Social History   Socioeconomic History   Marital status: Legally Separated  Tobacco Use   Smoking status: Former    Types: Cigarettes   Smokeless tobacco: Never  Vaping Use   Vaping status: Never Used  Substance and Sexual Activity   Alcohol use: Not Currently   Drug use: Never   Social Drivers of Health   Financial Resource Strain: High Risk (04/10/2023)   Received from [facility]   Overall Financial Resource Strain (CARDIA)    Difficulty of Paying Living Expenses: Very hard  Food Insecurity: Food Insecurity Present (12/25/2021)   Received from [facility]   Hunger Vital Sign    Worried About Running Out of Food in the Last Year: Often true    Ran Out of Food in the Last Year: Often true  Transportation Needs: Unmet Transportation Needs (04/10/2023)   Received from [facility] - Transportation    Lack of Transportation (Medical): Yes    Lack of Transportation (Non-Medical): Yes  Physical Activity: Inactive (12/25/2021)   Received from [facility]   Exercise Vital Sign    Days of Exercise per Week: 0 days    Minutes of Exercise per Session: 0 min  Stress: Stress Concern Present (04/10/2023)   Received from [facility] - Occupational Stress Questionnaire    Feeling of Stress : Very much  Social Connections: Socially Isolated (12/25/2021)   Received from [facility]   Social Connection and Isolation Panel [NHANES]    Frequency of Communication with Friends and Family: More than three times a week    Frequency of Social Gatherings with Friends and  Family: Never    Attends Religious Services: Never    Database administrator or Organizations: No    Attends Banker Meetings: Never    Marital Status: Separated    Objective:    Vitals:   09/13/23 1446  BP: 134/78  Pulse: 83  Temp: 36.6 C (97.8 F)  SpO2: (!) 85%  Weight: 59.1 kg (130 lb 6.4 oz)  Height: 167.6 cm (5\' 6" )  PainSc: 0-No pain    Body mass index is 21.05 kg/m.  Gen: A&Ox3, no distress  Unlabored respirations Abdomen is soft, nontender, nondistended  Assessment and Plan:  Diagnoses and all orders for this visit:  Biliary colic    I recommend proceeding with laparoscopic or robotic cholecystectomy with possible cholangiogram.  Discussed the relevant anatomy using a diagram  to demonstrate, and went over surgical technique.  Discussed risks of surgery including bleeding, infection, pain, scarring, intraabdominal injury specifically to the common bile duct and sequelae, subtotal cholecystectomy, bile leak, conversion to open surgery, failure to resolve symptoms, blood clots/ pulmonary embolus, heart attack, pneumonia, stroke, death. Questions welcomed and answered to patient's satisfaction.  Patient wishes to proceed with surgery.   Burk Hoctor Carlye Grippe, MD

## 2023-09-13 NOTE — H&P (View-Only) (Signed)
Melody [name] [id number]   Referring Provider:  Tresa Garter, MD   Subjective   Chief Complaint: New Consultation (Calculus of gallbladder without cholecystitis without obstruction)     History of Present Illness:    Very pleasant 68 year old woman with history of anxiety, bronchiectasis, chronic back and neck pain, bipolar depression, fibromyalgia, GERD, headache, hypertension, hypothyroidism, Mycobacterium avium intracellulare, arthritis, vitamin B12 deficiency and vertigo who presents for evaluation of gallstones.  Reports a nearly 49-month history of weight loss, epigastric pain and right upper quadrant pain, indigestion nausea/vomiting after meals, increased acid in her stomach. She had an ultrasound done on 11/4 noting multiple small gallstones without sonographic evidence of cholecystitis.  Common bile duct diameter 2 mm, liver normal.  She had lab work done at the end of October including an unremarkable CMP and CBC Previous abdominal surgery includes abdominal hysterectomy/BSO with sacrocolpopexy and anterior and posterior repair/bladder suspension in 25-Oct-2016.  Tubal ligation many years prior.   Former smoker, 48.5 pack years, quit in 10-26-2011 Her mother passed away from stomach cancer and so she is understandably anxious about that possibility as well.  She reports that she does have a hiatal hernia.  Review of Systems: A complete review of systems was obtained from the patient.  I have reviewed this information and discussed as appropriate with the patient.  See HPI as well for other ROS.   Medical History: Past Medical History:  Diagnosis Date   Anemia    Anxiety    Arthritis    COPD (chronic obstructive pulmonary disease) (CMS/HHS-HCC)    Esophageal varices (CMS/HHS-HCC)    GERD (gastroesophageal reflux disease)    Thyroid disease     There is no problem list on file for this patient.   Past Surgical History:  Procedure Laterality Date   HYSTERECTOMY        Allergies  Allergen Reactions   Metronidazole Other (See Comments), Shortness Of Breath and Swelling    Made tongue turn dark red, was not able to breath.   Doxycycline Itching   Gabapentin Other (See Comments)    Headaches: Patient is not aware of the reaction to this medication   Mometasone-Formoterol Other (See Comments)    thrush   Nabumetone Headache and Other (See Comments)    Headache   Sulfa (Sulfonamide Antibiotics) Unknown    Reaction is unknown   Tetracycline Itching   Tramadol Other (See Comments)    Current Outpatient Medications on File Prior to Visit  Medication Sig Dispense Refill   ALPRAZolam (XANAX) 1 MG tablet TAKE 1 TABLET BY MOUTH THREE TO FOUR TIMES DAILY AS NEEDED     cholecalciferol (VITAMIN D3) 1,250 mcg (50,000 unit) capsule TAKE ONE CAPSULE BY MOUTH EVERY 14 DAYS     cyanocobalamin (VITAMIN B12) 1,000 mcg/mL injection INJECT UNDER THE SKIN EVERY 14 DAYS     levothyroxine (SYNTHROID) 50 MCG tablet Take 1 tablet by mouth every morning before breakfast     losartan (COZAAR) 25 MG tablet Take 25 mg by mouth once daily     magnesium oxide (MAG-OX) 400 mg (241.3 mg magnesium) tablet TAKE 1 TABLET BY MOUTH ONCE daily AT NOON     methylphenidate HCl (RITALIN) 20 MG tablet TAKE 1 TABLET BY MOUTH in THE morning AND ONE AT NOON     omeprazole (PRILOSEC) 40 MG DR capsule TAKE ONE CAPSULE BY MOUTH EVERY MORNING and TAKE ONE CAPSULE EVERY EVENING     oxyBUTYnin (DITROPAN-XL) 10 MG XL tablet Take 10  mg by mouth at bedtime     oxyCODONE-acetaminophen (PERCOCET) 5-325 mg tablet TAKE 1 TABLET BY MOUTH EVERY 8 HOURS AS NEEDED FOR SEVERE PAIN     pantoprazole (PROTONIX) 40 MG DR tablet Take 1 tablet by mouth once daily     potassium chloride (KLOR-CON M20) 20 MEQ ER tablet TAKE 1 TABLET BY MOUTH ONCE DAILY AT NOON     SPIRIVA RESPIMAT 2.5 mcg/actuation inhalation spray INHALE TWO PUFFS BY MOUTH EVERY DAY     vilazodone (VIIBRYD) 10 mg tablet Take 10 mg by mouth once  daily     No current facility-administered medications on file prior to visit.    Family History  Problem Relation Age of Onset   Colon cancer Mother    High blood pressure (Hypertension) Father    Coronary Artery Disease (Blocked arteries around heart) Father      Social History   Tobacco Use  Smoking Status Former   Types: Cigarettes  Smokeless Tobacco Never     Social History   Socioeconomic History   Marital status: Legally Separated  Tobacco Use   Smoking status: Former    Types: Cigarettes   Smokeless tobacco: Never  Vaping Use   Vaping status: Never Used  Substance and Sexual Activity   Alcohol use: Not Currently   Drug use: Never   Social Drivers of Health   Financial Resource Strain: High Risk (04/10/2023)   Received from [facility]   Overall Financial Resource Strain (CARDIA)    Difficulty of Paying Living Expenses: Very hard  Food Insecurity: Food Insecurity Present (12/25/2021)   Received from [facility]   Hunger Vital Sign    Worried About Running Out of Food in the Last Year: Often true    Ran Out of Food in the Last Year: Often true  Transportation Needs: Unmet Transportation Needs (04/10/2023)   Received from [facility] Transportation    Lack of Transportation (Medical): Yes    Lack of Transportation (Non-Medical): Yes  Physical Activity: Inactive (12/25/2021)   Received from [facility]   Exercise Vital Sign    Days of Exercise per Week: 0 days    Minutes of Exercise per Session: 0 min  Stress: Stress Concern Present (04/10/2023)   Received from [facility] - Occupational Stress Questionnaire    Feeling of Stress : Very much  Social Connections: Socially Isolated (12/25/2021)   Received from [facility]   Social Connection and Isolation Panel [NHANES]    Frequency of Communication with Friends and Family: More than three times a week    Frequency of Social Gatherings with Friends and  Family: Never    Attends Religious Services: Never    Database administrator or Organizations: No    Attends Banker Meetings: Never    Marital Status: Separated    Objective:    Vitals:   09/13/23 1446  BP: 134/78  Pulse: 83  Temp: 36.6 C (97.8 F)  SpO2: (!) 85%  Weight: 59.1 kg (130 lb 6.4 oz)  Height: 167.6 cm (5\' 6" )  PainSc: 0-No pain    Body mass index is 21.05 kg/m.  Gen: A&Ox3, no distress  Unlabored respirations Abdomen is soft, nontender, nondistended  Assessment and Plan:  Diagnoses and all orders for this visit:  Biliary colic    I recommend proceeding with laparoscopic or robotic cholecystectomy with possible cholangiogram.  Discussed the relevant anatomy using a diagram  to demonstrate, and went over surgical technique.  Discussed risks of surgery including bleeding, infection, pain, scarring, intraabdominal injury specifically to the common bile duct and sequelae, subtotal cholecystectomy, bile leak, conversion to open surgery, failure to resolve symptoms, blood clots/ pulmonary embolus, heart attack, pneumonia, stroke, death. Questions welcomed and answered to patient's satisfaction.  Patient wishes to proceed with surgery.   Jenissa Tyrell Carlye Grippe, MD

## 2023-09-16 NOTE — Telephone Encounter (Signed)
Please schedule office visit with me or one of my partners.  Thank you

## 2023-09-17 NOTE — Telephone Encounter (Signed)
PCP has asked that pt make an apptmnt with him or any provider available.  Tried to call pt no answer.

## 2023-09-18 ENCOUNTER — Encounter (HOSPITAL_COMMUNITY)
Admission: RE | Admit: 2023-09-18 | Discharge: 2023-09-18 | Disposition: A | Discharge status: Home / Self Care | Payer: 59 | Source: Ambulatory Visit | Attending: Surgery | Admitting: Surgery

## 2023-09-18 ENCOUNTER — Encounter (HOSPITAL_COMMUNITY): Payer: Self-pay

## 2023-09-18 ENCOUNTER — Ambulatory Visit: Payer: 59 | Admitting: Internal Medicine

## 2023-09-18 ENCOUNTER — Encounter: Payer: Self-pay | Admitting: Internal Medicine

## 2023-09-18 ENCOUNTER — Other Ambulatory Visit: Payer: Self-pay

## 2023-09-18 VITALS — BP 115/65 | HR 62 | Temp 97.9°F | Resp 14 | Ht 66.0 in | Wt 129.0 lb

## 2023-09-18 VITALS — BP 110/60 | HR 82 | Temp 97.7°F | Ht 66.0 in | Wt 130.8 lb

## 2023-09-18 DIAGNOSIS — F319 Bipolar disorder, unspecified: Secondary | ICD-10-CM | POA: Diagnosis not present

## 2023-09-18 DIAGNOSIS — M545 Low back pain, unspecified: Secondary | ICD-10-CM

## 2023-09-18 DIAGNOSIS — G8929 Other chronic pain: Secondary | ICD-10-CM | POA: Diagnosis not present

## 2023-09-18 DIAGNOSIS — G43019 Migraine without aura, intractable, without status migrainosus: Secondary | ICD-10-CM | POA: Diagnosis not present

## 2023-09-18 DIAGNOSIS — E559 Vitamin D deficiency, unspecified: Secondary | ICD-10-CM | POA: Diagnosis not present

## 2023-09-18 DIAGNOSIS — E538 Deficiency of other specified B group vitamins: Secondary | ICD-10-CM

## 2023-09-18 DIAGNOSIS — I10 Essential (primary) hypertension: Secondary | ICD-10-CM | POA: Diagnosis not present

## 2023-09-18 DIAGNOSIS — Z01812 Encounter for preprocedural laboratory examination: Secondary | ICD-10-CM | POA: Diagnosis not present

## 2023-09-18 DIAGNOSIS — Z01818 Encounter for other preprocedural examination: Secondary | ICD-10-CM

## 2023-09-18 DIAGNOSIS — K8020 Calculus of gallbladder without cholecystitis without obstruction: Secondary | ICD-10-CM

## 2023-09-18 HISTORY — DX: Personal history of other diseases of the digestive system: Z87.19

## 2023-09-18 HISTORY — DX: Chronic obstructive pulmonary disease, unspecified: J44.9

## 2023-09-18 HISTORY — DX: Disease of blood and blood-forming organs, unspecified: D75.9

## 2023-09-18 LAB — BASIC METABOLIC PANEL
Anion gap: 7 (ref 5–15)
BUN: 18 mg/dL (ref 8–23)
CO2: 25 mmol/L (ref 22–32)
Calcium: 9.5 mg/dL (ref 8.9–10.3)
Chloride: 108 mmol/L (ref 98–111)
Creatinine, Ser: 1.13 mg/dL — ABNORMAL HIGH (ref 0.44–1.00)
GFR, Estimated: 53 mL/min — ABNORMAL LOW (ref >60)
Glucose, Bld: 97 mg/dL (ref 70–99)
Potassium: 4.3 mmol/L (ref 3.5–5.1)
Sodium: 140 mmol/L (ref 135–145)

## 2023-09-18 LAB — CBC
HCT: 41.8 % (ref 36.0–46.0)
Hemoglobin: 13.5 g/dL (ref 12.0–15.0)
MCH: 29.2 pg (ref 26.0–34.0)
MCHC: 32.3 g/dL (ref 30.0–36.0)
MCV: 90.3 fL (ref 80.0–100.0)
Platelets: 192 10*3/uL (ref 150–400)
RBC: 4.63 MIL/uL (ref 3.87–5.11)
RDW: 12.9 % (ref 11.5–15.5)
WBC: 7.7 10*3/uL (ref 4.0–10.5)
nRBC: 0 % (ref 0.0–0.2)

## 2023-09-18 NOTE — Progress Notes (Signed)
Subjective:  Patient ID: Melody [name], female    DOB: 07-Feb-1956  Age: 67 y.o. MRN: [id number]  CC: Follow-up (4 Week Follow Up. PT having gallbladder surgery next Monday 09/23/23)   HPI MARGUARITE HOTTEL presents for gallstones f/u: PT having gallbladder surgery next Monday by Dr Fredricka Bonine (on 09/23/23). F/u on LBP, anxiety, depression C/o R hip pain  Outpatient Medications Prior to Visit  Medication Sig Dispense Refill   albuterol (VENTOLIN HFA) 108 (90 Base) MCG/ACT inhaler INHALE TWO PUFFS BY MOUTH EVERY 4 HOURS AS NEEDED FOR wheezing OR SHORTNESS OF BREATH (Patient taking differently: Inhale 2 puffs into the lungs every 4 (four) hours as needed for shortness of breath or wheezing.) 8.5 g 5   ALPRAZolam (XANAX) 1 MG tablet Take 1 mg by mouth 3 (three) times daily.     Cholecalciferol (VITAMIN D3) 1.25 MG (50000 UT) CAPS TAKE ONE CAPSULE BY MOUTH EVERY 14 DAYS ON TUESDAYS (Patient taking differently: Take 1 capsule by mouth every 14 (fourteen) days.) 6 capsule 3   cyanocobalamin (VITAMIN B12) 1000 MCG/ML injection INJECT INTO THE SKIN EVERY 14 DAYS (Patient taking differently: Inject 1,000 mcg into the muscle every 14 (fourteen) days.) 6 mL 3   diclofenac Sodium (VOLTAREN) 1 % GEL APPLY TWO GRAMS TO AFFECTED AREA(S) FOUR TIMES DAILY (Patient taking differently: Apply 2 g topically 4 (four) times daily as needed (shoulder pain).) 200 g 3   fluticasone (FLONASE) 50 MCG/ACT nasal spray INSTILL TWO SPRAYS IN EACH NOSTRIL DAILY (Patient taking differently: Place 2 sprays into both nostrils daily.) 48 g 3   guaiFENesin-dextromethorphan (ROBITUSSIN DM) 100-10 MG/5ML syrup Take 5 mLs by mouth every 4 (four) hours as needed for cough. 118 mL 0   levocetirizine (XYZAL) 5 MG tablet TAKE 1 TABLET BY MOUTH EVERY EVENING (FOR ALLERGY) (Patient taking differently: Take 5 mg by mouth daily as needed for allergies.) 90 tablet 3   levothyroxine (SYNTHROID) 50 MCG tablet TAKE 1 TABLET BY MOUTH DAILY BEFORE  BREAKFAST 90 tablet 2   losartan (COZAAR) 25 MG tablet TAKE 1 TABLET BY MOUTH DAILY 30 tablet 5   magnesium oxide (MAG-OX) 400 (240 Mg) MG tablet TAKE 1 TABLET BY MOUTH ONCE daily AT NOON (Patient taking differently: Take 400 mg by mouth daily.) 30 tablet 3   methylphenidate (RITALIN) 20 MG tablet Take 1 tablet (20 mg total) by mouth daily. (Patient taking differently: Take 10-20 mg by mouth daily.)  0   omeprazole (PRILOSEC) 40 MG capsule TAKE ONE CAPSULE BY MOUTH EVERY MORNING and TAKE ONE CAPSULE EVERY EVENING (Patient taking differently: Take 40 mg by mouth in the morning and at bedtime.) 60 capsule 0   oxybutynin (DITROPAN-XL) 10 MG 24 hr tablet Take 1 tablet (10 mg total) by mouth at bedtime. Follow-up appt due in Nov must see provider for future refills 30 tablet 5   oxyCODONE-acetaminophen (PERCOCET/ROXICET) 5-325 MG tablet TAKE 1 TABLET BY MOUTH EVERY 8 HOURS AS NEEDED FOR SEVERE pain (Patient taking differently: Take 1 tablet by mouth every 8 (eight) hours as needed for severe pain (pain score 7-10).) 60 tablet 0   pantoprazole (PROTONIX) 40 MG tablet Take 1 tablet (40 mg total) by mouth 2 (two) times daily. 60 tablet 5   potassium chloride SA (KLOR-CON M) 20 MEQ tablet TAKE 1 TABLET BY MOUTH ONCE DAILY AT NOON (Patient taking differently: Take 20 mEq by mouth daily.) 30 tablet 3   QUEtiapine (SEROQUEL) 25 MG tablet Take 25 mg by mouth  at bedtime.     SPIRIVA RESPIMAT 2.5 MCG/ACT AERS INHALE TWO PUFFS BY MOUTH EVERY DAY (Patient taking differently: Inhale 2 puffs into the lungs daily.) 4 g 11   No facility-administered medications prior to visit.    ROS: Review of Systems  Constitutional:  Negative for activity change, appetite change, chills and fatigue.  HENT:  Negative for congestion, mouth sores and sinus pressure.   Eyes:  Negative for visual disturbance.  Respiratory:  Negative for cough and chest tightness.   Gastrointestinal:  Negative for abdominal pain and nausea.   Genitourinary:  Negative for difficulty urinating, frequency and vaginal pain.  Musculoskeletal:  Positive for arthralgias, back pain and gait problem.  Skin:  Negative for pallor and rash.  Neurological:  Negative for dizziness, tremors, weakness, numbness and headaches.  Psychiatric/Behavioral:  Negative for confusion, sleep disturbance and suicidal ideas.     Objective:  BP 110/60   Pulse 82   Temp 97.7 F (36.5 C) (Oral)   Ht 5\' 6"  (1.676 m)   Wt 130 lb 12.8 oz (59.3 kg)   LMP 10/29/2006 (Approximate)   SpO2 95%   BMI 21.11 kg/m   BP Readings from Last 3 Encounters:  09/18/23 110/60  09/18/23 115/65  08/23/23 116/70    Wt Readings from Last 3 Encounters:  09/18/23 130 lb 12.8 oz (59.3 kg)  09/18/23 129 lb (58.5 kg)  08/23/23 129 lb 6 oz (58.7 kg)    Physical Exam Constitutional:      Appearance: Normal appearance.  Musculoskeletal:        General: Tenderness present.     Right lower leg: No edema.     Left lower leg: No edema.  Neurological:     Mental Status: She is oriented to person, place, and time.     Gait: Gait abnormal.    Antalgic gait   Lab Results  Component Value Date   WBC 7.7 09/18/2023   HGB 13.5 09/18/2023   HCT 41.8 09/18/2023   PLT 192 09/18/2023   GLUCOSE 97 09/18/2023   CHOL 197 05/28/2022   TRIG 86.0 05/28/2022   HDL 59.90 05/28/2022   LDLCALC 120 (H) 05/28/2022   ALT 6 08/23/2023   AST 12 08/23/2023   NA 140 09/18/2023   K 4.3 09/18/2023   CL 108 09/18/2023   CREATININE 1.13 (H) 09/18/2023   BUN 18 09/18/2023   CO2 25 09/18/2023   TSH 2.40 08/23/2023   INR 1.0 04/03/2023    No results found.  Assessment & Plan:   Problem List Items Addressed This Visit     Vitamin B12 deficiency - Primary    On B12      Vitamin D deficiency    On Vit D      Bipolar I disorder (HCC) (Chronic)    On Seroquel F/u w/Dr Evelene Croon       Migraine    Maxalt prn      Calculus of gallbladder without cholecystitis without  obstruction     PT having gallbladder surgery next Monday by Dr Fredricka Bonine (on 09/23/23).      Chronic back pain    On Percocet prn  Potential benefits of a long term opioids use as well as potential risks (i.e. addiction risk, apnea etc) and complications (i.e. Somnolence, constipation and others) were explained to the patient and were aknowledged.          No orders of the defined types were placed in this encounter.  Follow-up: Return in about 3 months (around 12/19/2023) for a follow-up visit.  Sonda Primes, MD

## 2023-09-18 NOTE — Patient Instructions (Addendum)
SURGICAL WAITING ROOM VISITATION Patients having surgery or a procedure may have no more than 2 support people in the waiting area - these visitors may rotate in the visitor waiting room.   Due to an increase in RSV and influenza rates and associated hospitalizations, children ages 43 and under may not visit patients in [facility] hospitals. If the patient needs to stay at the hospital during part of their recovery, the visitor guidelines for inpatient rooms apply.  PRE-OP VISITATION  Pre-op nurse will coordinate an appropriate time for 1 support person to accompany the patient in pre-op.  This support person may not rotate.  This visitor will be contacted when the time is appropriate for the visitor to come back in the pre-op area.  Please refer to the [facility] website for the visitor guidelines for Inpatients (after your surgery is over and you are in a regular room).  You are not required to quarantine at this time prior to your surgery. However, you must do this: Hand Hygiene often Do NOT share personal items Notify your provider if you are in close contact with someone who has COVID or you develop fever 100.4 or greater, new onset of sneezing, cough, sore throat, shortness of breath or body aches.  If you test positive for Covid or have been in contact with anyone that has tested positive in the last 10 days please notify you surgeon.    Your procedure is scheduled on:  Monday   September 23, 2023  Report to [facility] Main Entrance: [place] entrance where the [employer] is available.   Report to admitting at: 11:15    AM  Call this number if you have any questions or problems the morning of surgery [phone]  DO NOT EAT OR DRINK ANYTHING AFTER MIDNIGHT THE NIGHT PRIOR TO YOUR SURGERY / PROCEDURE.   FOLLOW  ANY ADDITIONAL PRE OP INSTRUCTIONS YOU RECEIVED FROM YOUR SURGEON'S OFFICE!!!   Oral Hygiene is also important to reduce your risk of infection.         Remember - BRUSH YOUR TEETH THE MORNING OF SURGERY WITH YOUR REGULAR TOOTHPASTE  Do NOT smoke after Midnight the night before surgery.  STOP TAKING all Vitamins, Herbs and supplements 1 week before your surgery.   Take ONLY these medicines the morning of surgery with A SIP OF WATER: Pantoprazole (Protonix), Levothyroxine (Synthroid), and Xanax if needed.   You may use your inhalers if needed.                  You may not have any metal on your body including hair pins, jewelry, and body piercing  Do not wear make-up, lotions, powders, perfumes or deodorant  Do not wear nail polish including gel and S&S, artificial / acrylic nails, or any other type of covering on natural nails including finger and toenails. If you have artificial nails, gel coating, etc., that needs to be removed by a nail salon, Please have this removed prior to surgery. Not doing so may mean that your surgery could be cancelled or delayed if the Surgeon or anesthesia staff feels like they are unable to monitor you safely.   Do not shave 48 hours prior to surgery to avoid nicks in your skin which may contribute to postoperative infections.   Men may shave face and neck.  Contacts, Hearing Aids, dentures or bridgework may not be worn into surgery. DENTURES WILL BE REMOVED PRIOR TO SURGERY PLEASE DO NOT APPLY "Poly grip"  OR ADHESIVES!!!  Patients discharged on the day of surgery will not be allowed to drive home.  Someone NEEDS to stay with you for the first 24 hours after anesthesia.  Do not bring your home medications to the hospital. The Pharmacy will dispense medications listed on your medication list to you during your admission in the Hospital.  Please read over the following fact sheets you were given: IF YOU HAVE QUESTIONS ABOUT YOUR PRE-OP INSTRUCTIONS, PLEASE CALL [phone].    - Preparing for Surgery Before surgery, you can play an important role.  Because skin is not sterile, your skin needs to  be as free of germs as possible.  You can reduce the number of germs on your skin by washing with CHG (chlorahexidine gluconate) soap before surgery.  CHG is an antiseptic cleaner which kills germs and bonds with the skin to continue killing germs even after washing. Please DO NOT use if you have an allergy to CHG or antibacterial soaps.  If your skin becomes reddened/irritated stop using the CHG and inform your nurse when you arrive at Short Stay. Do not shave (including legs and underarms) for at least 48 hours prior to the first CHG shower.  You may shave your face/neck.  Please follow these instructions carefully:  1.  Shower with CHG Soap the night before surgery and the  morning of surgery.  2.  If you choose to wash your hair, wash your hair first as usual with your normal  shampoo.  3.  After you shampoo, rinse your hair and body thoroughly to remove the shampoo.                             4.  Use CHG as you would any other liquid soap.  You can apply chg directly to the skin and wash.  Gently with a scrungie or clean washcloth.  5.  Apply the CHG Soap to your body ONLY FROM THE NECK DOWN.   Do not use on face/ open                           Wound or open sores. Avoid contact with eyes, ears mouth and genitals (private parts).                       Wash face,  Genitals (private parts) with your normal soap.             6.  Wash thoroughly, paying special attention to the area where your  surgery  will be performed.  7.  Thoroughly rinse your body with warm water from the neck down.  8.  DO NOT shower/wash with your normal soap after using and rinsing off the CHG Soap.            9.  Pat yourself dry with a clean towel.            10.  Wear clean pajamas.            11.  Place clean sheets on your bed the night of your first shower and do not  sleep with pets.  ON THE DAY OF SURGERY : Do not apply any lotions/deodorants the morning of surgery.  Please wear clean clothes to the  hospital/surgery center.    FAILURE TO FOLLOW THESE INSTRUCTIONS MAY RESULT IN THE CANCELLATION OF YOUR SURGERY  PATIENT SIGNATURE_________________________________  NURSE SIGNATURE__________________________________  ________________________________________________________________________

## 2023-09-18 NOTE — Assessment & Plan Note (Signed)
On Percocet prn  Potential benefits of a long term opioids use as well as potential risks (i.e. addiction risk, apnea etc) and complications (i.e. Somnolence, constipation and others) were explained to the patient and were aknowledged.

## 2023-09-18 NOTE — Assessment & Plan Note (Signed)
On B12 

## 2023-09-18 NOTE — Assessment & Plan Note (Signed)
On Vit D 

## 2023-09-18 NOTE — Progress Notes (Addendum)
COVID Vaccine received:  []  No [x]  Yes Date of any COVID positive Test in last 90 days: none  PCP - Jacinta Shoe, MD Cardiologist - Simona Huh, MD  Chest x-ray - 04-03-2023   1v  Epic EKG - 04-03-2023  Epic  Stress Test - 02-13-2018  Epic ECHO -  Cardiac Cath -  ZIO Monitor-  12-19-2022  Epic  PCR screen: []  Ordered & Completed []   No Order but Needs PROFEND     [x]   N/A for this surgery  Surgery Plan:  [x]  Ambulatory   []  Outpatient in bed  []  Admit Anesthesia:    [x]  General  []  Spinal  []   Choice []   MAC  Bowel Prep - [x]  No  []   Yes ______  Pacemaker / ICD device [x]  No []  Yes   Spinal Cord Stimulator:[x]  No []  Yes       History of Sleep Apnea? [x]  No []  Yes   CPAP used?- [x]  No []  Yes    Does the patient monitor blood sugar?   [x]  N/A   []  No []  Yes  Patient has: [x]  NO Hx DM   []  Pre-DM   []  DM1  []   DM2  Blood Thinner / Instructions:  none Aspirin Instructions:  none  ERAS Protocol Ordered: [x]  No  []  Yes Patient is to be NPO after: Midnight prior  Dental hx: []  Dentures:  [x]  N/A      []  Bridge or Partial:                   []  Loose or Damaged teeth:   Comments: When Chanelle (Pharm. Tech) came to do the medication recon, Patient was very confused about which medications she takes currently. She stated that she has just took a Percocet for her hip pain. She had some meds written down but could not tell Chanelle if the list was accurate.   Activity level: Patient is able to climb a flight of stairs without difficulty; [x]  No CP   but would have _SOB   Patient can perform ADLs without assistance.   Anesthesia review: Bipolar I depression, GERD, COPD, thrombocytopenia, HTN, anemia, HOH- no HAs , fibromyalgia  Patient denies shortness of breath, fever, cough and chest pain at PAT appointment.  Patient verbalized understanding and agreement to the Pre-Surgical Instructions that were given to them at this PAT appointment. Patient was also educated of the need to  review these PAT instructions again prior to her surgery.I reviewed the appropriate phone numbers to call if they have any and questions or concerns.

## 2023-09-18 NOTE — Assessment & Plan Note (Signed)
PT having gallbladder surgery next Monday by Dr Fredricka Bonine (on 09/23/23).

## 2023-09-18 NOTE — Assessment & Plan Note (Signed)
Maxalt prn 

## 2023-09-18 NOTE — Assessment & Plan Note (Signed)
On Seroquel F/u w/Dr Evelene Croon

## 2023-09-19 ENCOUNTER — Ambulatory Visit: Payer: 59 | Admitting: Internal Medicine

## 2023-09-23 ENCOUNTER — Ambulatory Visit (HOSPITAL_COMMUNITY)
Admission: RE | Admit: 2023-09-23 | Discharge: 2023-09-23 | Disposition: A | Discharge status: Home / Self Care | Payer: 59 | Attending: Surgery | Admitting: Surgery

## 2023-09-23 ENCOUNTER — Encounter (HOSPITAL_COMMUNITY): Payer: Self-pay | Admitting: Surgery

## 2023-09-23 ENCOUNTER — Encounter (HOSPITAL_COMMUNITY)
Admission: RE | Disposition: A | Discharge status: Home / Self Care | Payer: Self-pay | Source: Home / Self Care | Attending: Surgery

## 2023-09-23 ENCOUNTER — Other Ambulatory Visit: Payer: Self-pay

## 2023-09-23 ENCOUNTER — Ambulatory Visit (HOSPITAL_COMMUNITY): Payer: 59 | Admitting: Anesthesiology

## 2023-09-23 ENCOUNTER — Ambulatory Visit (HOSPITAL_BASED_OUTPATIENT_CLINIC_OR_DEPARTMENT_OTHER): Payer: 59 | Admitting: Anesthesiology

## 2023-09-23 DIAGNOSIS — Z87891 Personal history of nicotine dependence: Secondary | ICD-10-CM | POA: Diagnosis not present

## 2023-09-23 DIAGNOSIS — Z800 Family history of malignant neoplasm of digestive organs: Secondary | ICD-10-CM | POA: Insufficient documentation

## 2023-09-23 DIAGNOSIS — K811 Chronic cholecystitis: Secondary | ICD-10-CM | POA: Diagnosis not present

## 2023-09-23 DIAGNOSIS — J449 Chronic obstructive pulmonary disease, unspecified: Secondary | ICD-10-CM | POA: Diagnosis not present

## 2023-09-23 DIAGNOSIS — K449 Diaphragmatic hernia without obstruction or gangrene: Secondary | ICD-10-CM | POA: Diagnosis not present

## 2023-09-23 DIAGNOSIS — K8010 Calculus of gallbladder with chronic cholecystitis without obstruction: Secondary | ICD-10-CM | POA: Insufficient documentation

## 2023-09-23 DIAGNOSIS — F319 Bipolar disorder, unspecified: Secondary | ICD-10-CM | POA: Diagnosis not present

## 2023-09-23 DIAGNOSIS — Z79890 Hormone replacement therapy (postmenopausal): Secondary | ICD-10-CM | POA: Insufficient documentation

## 2023-09-23 DIAGNOSIS — K219 Gastro-esophageal reflux disease without esophagitis: Secondary | ICD-10-CM | POA: Diagnosis not present

## 2023-09-23 DIAGNOSIS — M797 Fibromyalgia: Secondary | ICD-10-CM | POA: Insufficient documentation

## 2023-09-23 DIAGNOSIS — I10 Essential (primary) hypertension: Secondary | ICD-10-CM | POA: Diagnosis not present

## 2023-09-23 DIAGNOSIS — K8043 Calculus of bile duct with acute cholecystitis with obstruction: Secondary | ICD-10-CM | POA: Diagnosis not present

## 2023-09-23 DIAGNOSIS — K8064 Calculus of gallbladder and bile duct with chronic cholecystitis without obstruction: Secondary | ICD-10-CM | POA: Diagnosis not present

## 2023-09-23 DIAGNOSIS — K8044 Calculus of bile duct with chronic cholecystitis without obstruction: Secondary | ICD-10-CM | POA: Diagnosis not present

## 2023-09-23 HISTORY — PX: CHOLECYSTECTOMY: SHX55

## 2023-09-23 SURGERY — LAPAROSCOPIC CHOLECYSTECTOMY
Anesthesia: General

## 2023-09-23 MED ORDER — BUPIVACAINE LIPOSOME 1.3 % IJ SUSP: 20.0000 mL | Freq: Once | INTRAMUSCULAR | Status: DC

## 2023-09-23 MED ORDER — SUGAMMADEX SODIUM 200 MG/2ML IV SOLN
INTRAVENOUS | Status: DC | PRN
  Administered 2023-09-23: 200 mg via INTRAVENOUS

## 2023-09-23 MED ORDER — BUPIVACAINE LIPOSOME 1.3 % IJ SUSP
INTRAMUSCULAR | Status: AC
  Filled 2023-09-23: qty 20

## 2023-09-23 MED ORDER — ORAL CARE MOUTH RINSE: 15.0000 mL | Freq: Once | OROMUCOSAL | Status: AC

## 2023-09-23 MED ORDER — LIDOCAINE HCL (CARDIAC) PF 100 MG/5ML IV SOSY
PREFILLED_SYRINGE | INTRAVENOUS | Status: DC | PRN
  Administered 2023-09-23: 80 mg via INTRAVENOUS

## 2023-09-23 MED ORDER — FENTANYL CITRATE PF 50 MCG/ML IJ SOSY
PREFILLED_SYRINGE | INTRAMUSCULAR | Status: AC
  Filled 2023-09-23: qty 1

## 2023-09-23 MED ORDER — KETOROLAC TROMETHAMINE 30 MG/ML IJ SOLN
30.0000 mg | Freq: Once | INTRAMUSCULAR | Status: AC | PRN
  Administered 2023-09-23: 30 mg via INTRAVENOUS

## 2023-09-23 MED ORDER — BUPIVACAINE-EPINEPHRINE 0.25% -1:200000 IJ SOLN
INTRAMUSCULAR | Status: AC
  Filled 2023-09-23: qty 1

## 2023-09-23 MED ORDER — ONDANSETRON HCL 4 MG/2ML IJ SOLN
INTRAMUSCULAR | Status: AC
  Filled 2023-09-23: qty 2

## 2023-09-23 MED ORDER — OXYCODONE HCL 5 MG PO TABS: 5.0000 mg | ORAL_TABLET | Freq: Once | ORAL | Status: DC | PRN

## 2023-09-23 MED ORDER — CHLORHEXIDINE GLUCONATE 4 % EX SOLN: 60.0000 mL | Freq: Once | CUTANEOUS | Status: DC

## 2023-09-23 MED ORDER — LACTATED RINGERS IV SOLN: INTRAVENOUS | Status: DC

## 2023-09-23 MED ORDER — ROCURONIUM BROMIDE 100 MG/10ML IV SOLN
INTRAVENOUS | Status: DC | PRN
  Administered 2023-09-23: 50 mg via INTRAVENOUS

## 2023-09-23 MED ORDER — 0.9 % SODIUM CHLORIDE (POUR BTL) OPTIME
TOPICAL | Status: DC | PRN
  Administered 2023-09-23: 1000 mL

## 2023-09-23 MED ORDER — OXYCODONE HCL 5 MG/5ML PO SOLN: 5.0000 mg | Freq: Once | ORAL | Status: DC | PRN

## 2023-09-23 MED ORDER — CEFAZOLIN SODIUM-DEXTROSE 2-4 GM/100ML-% IV SOLN
2.0000 g | INTRAVENOUS | Status: AC
  Administered 2023-09-23: 2 g via INTRAVENOUS
  Filled 2023-09-23: qty 100

## 2023-09-23 MED ORDER — LACTATED RINGERS IR SOLN
Status: DC | PRN
  Administered 2023-09-23: 1000 mL

## 2023-09-23 MED ORDER — BUPIVACAINE-EPINEPHRINE 0.25% -1:200000 IJ SOLN
INTRAMUSCULAR | Status: DC | PRN
  Administered 2023-09-23: 50 mL

## 2023-09-23 MED ORDER — PROPOFOL 10 MG/ML IV BOLUS
INTRAVENOUS | Status: AC
  Filled 2023-09-23: qty 20

## 2023-09-23 MED ORDER — FENTANYL CITRATE (PF) 100 MCG/2ML IJ SOLN
INTRAMUSCULAR | Status: DC | PRN
  Administered 2023-09-23: 100 ug via INTRAVENOUS
  Administered 2023-09-23: 50 ug via INTRAVENOUS

## 2023-09-23 MED ORDER — TRAMADOL HCL 50 MG PO TABS: 50.0000 mg | ORAL_TABLET | Freq: Four times a day (QID) | ORAL | 0 refills | Status: AC | PRN

## 2023-09-23 MED ORDER — FENTANYL CITRATE PF 50 MCG/ML IJ SOSY
25.0000 ug | PREFILLED_SYRINGE | INTRAMUSCULAR | Status: DC | PRN
  Administered 2023-09-23 (×2): 50 ug via INTRAVENOUS

## 2023-09-23 MED ORDER — EPHEDRINE SULFATE (PRESSORS) 50 MG/ML IJ SOLN
INTRAMUSCULAR | Status: DC | PRN
  Administered 2023-09-23 (×3): 5 mg via INTRAVENOUS

## 2023-09-23 MED ORDER — LACTATED RINGERS IV SOLN: INTRAVENOUS | Status: DC | PRN

## 2023-09-23 MED ORDER — DOCUSATE SODIUM 100 MG PO CAPS: 100.0000 mg | ORAL_CAPSULE | Freq: Two times a day (BID) | ORAL | 0 refills | Status: AC

## 2023-09-23 MED ORDER — FENTANYL CITRATE (PF) 250 MCG/5ML IJ SOLN
INTRAMUSCULAR | Status: AC
  Filled 2023-09-23: qty 5

## 2023-09-23 MED ORDER — ONDANSETRON HCL 4 MG/2ML IJ SOLN
4.0000 mg | Freq: Once | INTRAMUSCULAR | Status: AC | PRN
  Administered 2023-09-23: 4 mg via INTRAVENOUS

## 2023-09-23 MED ORDER — MIDAZOLAM HCL 5 MG/5ML IJ SOLN
INTRAMUSCULAR | Status: DC | PRN
  Administered 2023-09-23: 2 mg via INTRAVENOUS

## 2023-09-23 MED ORDER — CHLORHEXIDINE GLUCONATE 0.12 % MT SOLN
15.0000 mL | Freq: Once | OROMUCOSAL | Status: AC
  Administered 2023-09-23: 15 mL via OROMUCOSAL

## 2023-09-23 MED ORDER — KETOROLAC TROMETHAMINE 30 MG/ML IJ SOLN
INTRAMUSCULAR | Status: AC
  Filled 2023-09-23: qty 1

## 2023-09-23 MED ORDER — MIDAZOLAM HCL 2 MG/2ML IJ SOLN
INTRAMUSCULAR | Status: AC
Start: 2023-09-23 — End: ?
  Filled 2023-09-23: qty 2

## 2023-09-23 MED ORDER — LIDOCAINE HCL (PF) 2 % IJ SOLN
INTRAMUSCULAR | Status: AC
  Filled 2023-09-23: qty 35

## 2023-09-23 MED ORDER — EPHEDRINE 5 MG/ML INJ
INTRAVENOUS | Status: AC
  Filled 2023-09-23: qty 10

## 2023-09-23 MED ORDER — ACETAMINOPHEN 500 MG PO TABS
1000.0000 mg | ORAL_TABLET | ORAL | Status: AC
  Administered 2023-09-23: 1000 mg via ORAL
  Filled 2023-09-23: qty 2

## 2023-09-23 MED ORDER — PROPOFOL 10 MG/ML IV BOLUS
INTRAVENOUS | Status: DC | PRN
  Administered 2023-09-23: 150 mg via INTRAVENOUS

## 2023-09-23 MED ORDER — ONDANSETRON HCL 4 MG/2ML IJ SOLN
INTRAMUSCULAR | Status: DC | PRN
  Administered 2023-09-23: 4 mg via INTRAVENOUS

## 2023-09-23 MED ORDER — DEXAMETHASONE SODIUM PHOSPHATE 4 MG/ML IJ SOLN
INTRAMUSCULAR | Status: DC | PRN
  Administered 2023-09-23: 5 mg via INTRAVENOUS

## 2023-09-23 MED ORDER — DEXAMETHASONE SODIUM PHOSPHATE 10 MG/ML IJ SOLN
INTRAMUSCULAR | Status: AC
  Filled 2023-09-23: qty 1

## 2023-09-23 SURGICAL SUPPLY — 39 items
APPLIER CLIP ROT 10 11.4 M/L (STAPLE)
BAG COUNTER SPONGE SURGICOUNT (BAG) ×1 IMPLANT
BENZOIN TINCTURE PRP APPL 2/3 (GAUZE/BANDAGES/DRESSINGS) IMPLANT
BNDG ADH 1X3 SHEER STRL LF (GAUZE/BANDAGES/DRESSINGS) IMPLANT
CABLE HIGH FREQUENCY MONO STRZ (ELECTRODE) ×1 IMPLANT
CHLORAPREP W/TINT 26 (MISCELLANEOUS) ×1 IMPLANT
CLIP APPLIE ROT 10 11.4 M/L (STAPLE) ×1 IMPLANT
COVER MAYO STAND XLG (MISCELLANEOUS) IMPLANT
COVER SURGICAL LIGHT HANDLE (MISCELLANEOUS) ×1 IMPLANT
DERMABOND ADVANCED .7 DNX12 (GAUZE/BANDAGES/DRESSINGS) IMPLANT
DRAPE C-ARM 42X120 X-RAY (DRAPES) IMPLANT
ELECT REM PT RETURN 15FT ADLT (MISCELLANEOUS) ×1 IMPLANT
ENDOLOOP SUT PDS II 0 18 (SUTURE) IMPLANT
GLOVE BIO SURGEON STRL SZ 6 (GLOVE) ×1 IMPLANT
GLOVE INDICATOR 6.5 STRL GRN (GLOVE) ×1 IMPLANT
GOWN STRL REUS W/ TWL LRG LVL3 (GOWN DISPOSABLE) ×1 IMPLANT
GRASPER SUT TROCAR 14GX15 (MISCELLANEOUS) IMPLANT
HEMOSTAT SNOW SURGICEL 2X4 (HEMOSTASIS) IMPLANT
IRRIG SUCT STRYKERFLOW 2 WTIP (MISCELLANEOUS) ×1
IRRIGATION SUCT STRKRFLW 2 WTP (MISCELLANEOUS) ×1 IMPLANT
KIT BASIN OR (CUSTOM PROCEDURE TRAY) ×1 IMPLANT
KIT TURNOVER KIT A (KITS) IMPLANT
NDL INSUFFLATION 14GA 120MM (NEEDLE) ×1 IMPLANT
NEEDLE INSUFFLATION 14GA 120MM (NEEDLE) ×1
SCISSORS LAP 5X35 DISP (ENDOMECHANICALS) ×1 IMPLANT
SET CHOLANGIOGRAPH MIX (MISCELLANEOUS) IMPLANT
SET TUBE SMOKE EVAC HIGH FLOW (TUBING) ×1 IMPLANT
SLEEVE Z-THREAD 5X100MM (TROCAR) ×1 IMPLANT
SPIKE FLUID TRANSFER (MISCELLANEOUS) ×1 IMPLANT
STRIP CLOSURE SKIN 1/2X4 (GAUZE/BANDAGES/DRESSINGS) IMPLANT
SUT MNCRL AB 4-0 PS2 18 (SUTURE) ×1 IMPLANT
SYS BAG RETRIEVAL 10MM (BASKET) ×1
SYSTEM BAG RETRIEVAL 10MM (BASKET) ×1 IMPLANT
TOWEL OR 17X26 10 PK STRL BLUE (TOWEL DISPOSABLE) ×1 IMPLANT
TOWEL OR NON WOVEN STRL DISP B (DISPOSABLE) IMPLANT
TRAY LAPAROSCOPIC (CUSTOM PROCEDURE TRAY) ×1 IMPLANT
TROCAR ADV FIXATION 12X100MM (TROCAR) ×1 IMPLANT
TROCAR XCEL NON-BLD 5MMX100MML (ENDOMECHANICALS) IMPLANT
TROCAR Z-THREAD OPTICAL 5X100M (TROCAR) ×1 IMPLANT

## 2023-09-23 NOTE — Anesthesia Preprocedure Evaluation (Signed)
Anesthesia Evaluation  Patient identified by MRN, date of birth, ID band Patient awake    Reviewed: Allergy & Precautions, H&P , NPO status , Patient's Chart, lab work & pertinent test results  Airway Mallampati: II  TM Distance: >3 FB Neck ROM: Full    Dental no notable dental hx.    Pulmonary COPD,  COPD inhaler, former smoker   Pulmonary exam normal breath sounds clear to auscultation       Cardiovascular hypertension, Pt. on medications Normal cardiovascular exam Rhythm:Regular Rate:Normal     Neuro/Psych     Bipolar Disorder   negative neurological ROS     GI/Hepatic Neg liver ROS,GERD  Medicated,,  Endo/Other  negative endocrine ROS    Renal/GU negative Renal ROS  negative genitourinary   Musculoskeletal  (+) Arthritis ,    Abdominal   Peds negative pediatric ROS (+)  Hematology negative hematology ROS (+)   Anesthesia Other Findings   Reproductive/Obstetrics negative OB ROS                             Anesthesia Physical Anesthesia Plan  ASA: 3  Anesthesia Plan: General   Post-op Pain Management: Minimal or no pain anticipated   Induction: Intravenous  PONV Risk Score and Plan: 3 and Ondansetron, Dexamethasone and Treatment may vary due to age or medical condition  Airway Management Planned: Oral ETT  Additional Equipment:   Intra-op Plan:   Post-operative Plan: Extubation in OR  Informed Consent: I have reviewed the patients History and Physical, chart, labs and discussed the procedure including the risks, benefits and alternatives for the proposed anesthesia with the patient or authorized representative who has indicated his/her understanding and acceptance.     Dental advisory given  Plan Discussed with: CRNA and Surgeon  Anesthesia Plan Comments:        Anesthesia Quick Evaluation

## 2023-09-23 NOTE — Discharge Instructions (Signed)
LAPAROSCOPIC SURGERY: POST OP INSTRUCTIONS   EAT Gradually transition to a high fiber diet with a fiber supplement over the next few weeks after discharge.  Start with a pureed / full liquid diet (see below)  WALK Walk an hour a day (cumulative- not all at once).  Control your pain to do that.    CONTROL PAIN Control pain so that you can walk, sleep, tolerate sneezing/coughing, go up/down stairs.  HAVE A BOWEL MOVEMENT DAILY Keep your bowels regular to avoid problems.  OK to try a laxative to override constipation.  OK to use an antidiarrheal to slow down diarrhea.  Call if not better after 2 tries  CALL IF YOU HAVE PROBLEMS/CONCERNS Call if you are still struggling despite following these instructions. Call if you have concerns not answered by these instructions    DIET: Follow a light bland diet & liquids the first 24 hours after arrival home, such as soup, liquids, starches, etc.  Be sure to drink plenty of fluids.  Quickly advance to a usual solid diet within a few days.  Avoid fast food or heavy meals initially as you are more likely to get nauseated or have irregular bowels.    Take your usually prescribed home medications unless otherwise directed.  PAIN CONTROL: Pain is best controlled by a usual combination of three different methods TOGETHER: Ice/Heat Over the counter pain medication Prescription pain medication Most patients will experience some swelling and bruising around the incisions.  Ice packs or heating pads (30-60 minutes up to 6 times a day) will help. Use ice for the first few days to help decrease swelling and bruising, then switch to heat to help relax tight/sore spots and speed recovery.  Some people prefer to use ice alone, heat alone, alternating between ice & heat.  Experiment to what works for you.  Swelling and bruising can take several weeks to resolve.   It is helpful to take an over-the-counter pain medication regularly for the first few days: Naproxen  (Aleve, etc)  Two 220mg tabs twice a day OR Ibuprofen (Advil, etc) Three 200mg tabs four times a day (every meal & bedtime) AND Acetaminophen (Tylenol, etc) 500-650mg four times a day (every meal & bedtime) A  prescription for pain medication (such as oxycodone, hydrocodone, tramadol, gabapentin, methocarbamol, etc) should be given to you upon discharge.  Take your pain medication as prescribed, IF NEEDED.  If you are having problems/concerns with the prescription medicine (does not control pain, nausea, vomiting, rash, itching, etc), please call us [phone] to see if we need to switch you to a different pain medicine that will work better for you and/or control your side effect better. If you need a refill on your pain medication, please give us 48 hour notice.  contact your pharmacy.  They will contact our office to request authorization. Prescriptions will not be filled after 5 pm or on week-ends  Avoid getting constipated.   Between the surgery and the pain medications, it is common to experience some constipation.   Increasing fluid intake and taking a fiber supplement (such as Metamucil, Citrucel, FiberCon, MiraLax, etc) 1-2 times a day regularly will usually help prevent this problem from occurring.   A mild laxative (prune juice, Milk of Magnesia, MiraLax, etc) should be taken according to package directions if there are no bowel movements after 48 hours.   Watch out for diarrhea.   If you have many loose bowel movements, simplify your diet to bland foods & liquids   for a few days.   Stop any stool softeners and decrease your fiber supplement.   Switching to mild anti-diarrheal medications (Kayopectate, Pepto Bismol) can help.   If this worsens or does not improve, please call us.  Wash / shower every day.  You may shower over the skin glue which is waterproof.  Do not soak or submerge incisions.  No rubbing, scrubbing, lotions or ointments to incisions.  Glue will flake off after  about 2 weeks.  You may leave the incision open to air.  You may replace a dressing/Band-Aid to cover the incision for comfort if you wish.   ACTIVITIES as tolerated:   You may resume regular (light) daily activities beginning the next day--such as daily self-care, walking, climbing stairs--gradually increasing activities as tolerated.  If you can walk 30 minutes without difficulty, it is safe to try more intense activity such as jogging, treadmill, bicycling, low-impact aerobics, swimming, etc. Save the most intensive and strenuous activity for last such as sit-ups, heavy lifting, contact sports, etc  Refrain from any heavy lifting or straining until you are off narcotics for pain control.   DO NOT PUSH THROUGH PAIN.  Let pain be your guide: If it hurts to do something, don't do it.  Pain is your body warning you to avoid that activity for another week until the pain goes down. You may drive when you are no longer taking prescription pain medication, you can comfortably wear a seatbelt, and you can safely maneuver your car and apply brakes. You may have sexual intercourse when it is comfortable.  FOLLOW UP in our office Please call [facility] at [phone] to set up an appointment to see your surgeon in the office for a follow-up appointment approximately 2-3 weeks after your surgery. Make sure that you call for this appointment the day you arrive home to insure a convenient appointment time.  10. IF YOU HAVE DISABILITY OR FAMILY LEAVE FORMS, BRING THEM TO THE OFFICE FOR PROCESSING.  DO NOT GIVE THEM TO YOUR DOCTOR.   WHEN TO CALL US [phone]: Poor pain control Reactions / problems with new medications (rash/itching, nausea, etc)  Fever over 101.5 F (38.5 C) Inability to urinate Nausea and/or vomiting Worsening swelling or bruising Continued bleeding from incision. Increased pain, redness, or drainage from the incision   The clinic staff is available to answer your questions during  regular business hours (8:30am-5pm).  Please don't hesitate to call and ask to speak to one of our nurses for clinical concerns.   If you have a medical emergency, go to the nearest emergency room or call 911.  A surgeon from [facility] is always on call at the hospitals   [facility] [address], [address], [place], [place]  [address] ? MAIN: [phone] ? TOLL FREE: [phone] ?  FAX [phone] [email or web address]  

## 2023-09-23 NOTE — Transfer of Care (Signed)
Immediate Anesthesia Transfer of Care Note  Patient: Melody [name]  Procedure(s) Performed: LAPAROSCOPIC CHOLECYSTECTOMY  Patient Location: PACU  Anesthesia Type:General  Level of Consciousness: awake and alert   Airway & Oxygen Therapy: Patient Spontanous Breathing and Patient connected to face mask oxygen  Post-op Assessment: Report given to RN and Post -op Vital signs reviewed and stable  Post vital signs: Reviewed and stable  Last Vitals:  Vitals Value Taken Time  BP 146/73 09/23/23 1351  Temp 36.6 C 09/23/23 1350  Pulse 89 09/23/23 1353  Resp 19 09/23/23 1353  SpO2 100 % 09/23/23 1353  Vitals shown include unfiled device data.  Last Pain:  Vitals:   09/23/23 1132  TempSrc: Oral  PainSc:          Complications: No notable events documented.

## 2023-09-23 NOTE — Interval H&P Note (Signed)
History and Physical Interval Note:  09/23/2023 11:52 AM  Melody [name]  has presented today for surgery, with the diagnosis of BILIARY COLIC.  The various methods of treatment have been discussed with the patient and family. After consideration of risks, benefits and other options for treatment, the patient has consented to  Procedure(s): LAPAROSCOPIC CHOLECYSTECTOMY (N/A) as a surgical intervention.  The patient's history has been reviewed, patient examined, no change in status, stable for surgery.  I have reviewed the patient's chart and labs.  Questions were answered to the patient's satisfaction.     [name] Lollie Sails

## 2023-09-23 NOTE — Op Note (Signed)
Operative Note  Melody [name] 67 y.o. female [id number]  09/23/2023  Surgeon: Berna Bue MD FACS  Procedure performed: Laparoscopic Cholecystectomy  Preop diagnosis: biliary colic Post-op diagnosis/intraop findings: same, chronic cholecystitis  Specimens: gallbladder  Retained items: none  EBL: minimal  Complications: none  Description of procedure: After confirming informed consent the patient was brought to the operating room. Antibiotics were administered. SCD's were applied. General endotracheal anesthesia was initiated and a formal time-out was performed. The abdomen was prepped and draped in the usual sterile fashion and the abdomen was entered using an infraumbilical veress needle after instilling the site with local. Insufflation to was obtained, 5mm trocar and camera inserted, and gross inspection revealed no evidence of injury from our entry or other intraabdominal abnormalities. Omental adhesions are present to the umbilical region. Two 5mm trocars were introduced in the right midclavicular and right anterior axillary lines under direct visualization and following infiltration with local. An 11mm trocar was placed in the epigastrium. The gallbladder fundus was retracted cephalad and the infundibulum was retracted laterally. A combination of hook electrocautery and blunt dissection was utilized to clear the peritoneum from the neck and cystic duct, circumferentially isolating the cystic artery and cystic duct and lifting the gallbladder from the cystic plate. The critical view of safety was achieved with the cystic artery, cystic duct, and liver bed visualized between them with no other structures. The common bile duct is also easily visible due to patient's thin habitus and is well away from the area of dissection. The cystic artery was clipped with a single clip proximally and distally and divided as was the cystic duct with four clips on the proximal end. The  gallbladder was dissected from the liver plate using electrocautery. There was a small posterior branch artery which was clipped proximally. Once freed the gallbladder was placed in an endocatch bag and removed intact through the epigastric trocar site. The right upper quadrant was irrigated; the effluent was clear. Hemostasis was once again confirmed, and reinspection of the abdomen revealed no injuries. The clips were well apposed without any bile leak from the duct or the liver bed. The 11mm trocar site in the epigastrium was closed with a 0 vicryl in the fascia under direct visualization using a PMI device. The abdomen was desufflated and all trocars removed. The skin incisions were closed with subcuticular 4-0 monocryl and Dermabond. The patient was awakened, extubated and transported to the recovery room in stable condition.    All counts were correct at the completion of the case.

## 2023-09-23 NOTE — Anesthesia Procedure Notes (Signed)
Procedure Name: Intubation Date/Time: 09/23/2023 1:01 PM  Performed by: Deri Fuelling, CRNAPre-anesthesia Checklist: Patient identified, Emergency Drugs available, Suction available and Patient being monitored Patient Re-evaluated:Patient Re-evaluated prior to induction Oxygen Delivery Method: Circle system utilized Preoxygenation: Pre-oxygenation with 100% oxygen Induction Type: IV induction Ventilation: Mask ventilation without difficulty Laryngoscope Size: Mac and 3 Grade View: Grade I Tube type: Oral Tube size: 7.0 mm Number of attempts: 1 Airway Equipment and Method: Stylet and Oral airway Placement Confirmation: ETT inserted through vocal cords under direct vision, positive ETCO2 and breath sounds checked- equal and bilateral Secured at: 21 cm Tube secured with: Tape Dental Injury: Teeth and Oropharynx as per pre-operative assessment

## 2023-09-23 NOTE — Anesthesia Postprocedure Evaluation (Signed)
Anesthesia Post Note  Patient: Melody [name]  Procedure(s) Performed: LAPAROSCOPIC CHOLECYSTECTOMY     Patient location during evaluation: PACU Anesthesia Type: General Level of consciousness: awake and alert Pain management: pain level controlled Vital Signs Assessment: post-procedure vital signs reviewed and stable Respiratory status: spontaneous breathing, nonlabored ventilation, respiratory function stable and patient connected to nasal cannula oxygen Cardiovascular status: blood pressure returned to baseline and stable Postop Assessment: no apparent nausea or vomiting Anesthetic complications: no  No notable events documented.  Last Vitals:  Vitals:   09/23/23 1430 09/23/23 1445  BP: 120/60 114/61  Pulse: 73 61  Resp: 20 13  Temp:    SpO2: 96% 98%    Last Pain:  Vitals:   09/23/23 1445  TempSrc:   PainSc: 4                  Geovanie Winnett S

## 2023-09-24 ENCOUNTER — Other Ambulatory Visit: Payer: Self-pay | Admitting: Internal Medicine

## 2023-09-24 ENCOUNTER — Encounter (HOSPITAL_COMMUNITY): Payer: Self-pay | Admitting: Surgery

## 2023-09-24 DIAGNOSIS — N3281 Overactive bladder: Secondary | ICD-10-CM

## 2023-09-25 LAB — SURGICAL PATHOLOGY

## 2023-10-01 ENCOUNTER — Ambulatory Visit (HOSPITAL_COMMUNITY): Payer: 59 | Admitting: Psychiatry

## 2023-10-03 ENCOUNTER — Other Ambulatory Visit: Payer: Self-pay | Admitting: Internal Medicine

## 2023-10-03 DIAGNOSIS — N3281 Overactive bladder: Secondary | ICD-10-CM

## 2023-10-03 NOTE — Telephone Encounter (Signed)
Good afternoon! Last set of scripts for these medications were refused. Is PT still taking these medications or is this request erroneous? Thanks in advance

## 2023-10-07 ENCOUNTER — Other Ambulatory Visit: Payer: Self-pay | Admitting: Internal Medicine

## 2023-10-07 DIAGNOSIS — N3281 Overactive bladder: Secondary | ICD-10-CM

## 2023-11-21 ENCOUNTER — Other Ambulatory Visit: Payer: Self-pay | Admitting: Internal Medicine

## 2023-11-21 DIAGNOSIS — E034 Atrophy of thyroid (acquired): Secondary | ICD-10-CM

## 2023-11-27 ENCOUNTER — Ambulatory Visit: Payer: Self-pay | Admitting: Internal Medicine

## 2023-11-27 ENCOUNTER — Telehealth: Payer: Self-pay | Admitting: Internal Medicine

## 2023-11-27 NOTE — Telephone Encounter (Signed)
 Copied from CRM [id number]. Topic: Clinical - Medication Question >> Nov 27, 2023 12:16 PM Suzette B wrote: Reason for CRM: Patient has called in stating she just spoke with the nurse, she forgot to add on to the nurse in reference to ongoing issues with was explained in CRM [id number],  Patient stated she forgot to add the severity of pain when she urinates . She is requesting an antibiotic for the ongoing issue, but also needing someone to call her back [phone]

## 2023-11-27 NOTE — Telephone Encounter (Signed)
Copied from CRM [id number]. Topic: Clinical - Red Word Triage >> Nov 27, 2023 11:42 AM Denese Killings wrote: Red Word that prompted transfer to Nurse Triage: Patient is feeling nauseous, hurting in stomach, and is spitting constantly from thrush. Patient wants an antibiotic as well.  Chief Complaint: pain Symptoms: abd pain, thrush on tongue, nausea,  Frequency: started today Pertinent Negatives: Patient denies fever Disposition: [] ED /[] Urgent Care (no appt availability in office) / [] Appointment(In office/virtual)/ []  [place] [facility]/ [] Home Care/ [x] Refused Recommended Disposition /[] [facility]/ []  Follow-up with PCP Additional Notes: offered patient appt: patient refused: pt stated she gets this on regular basis and she usually just send her PCP a message and requests a RX and that is what she would like to do today. On ABX all the time therefore gets thrush regularly - feels like its down your throat.   Answer Assessment - Initial Assessment Questions 1. LOCATION: "Where does it hurt?"      Middle of Abd 2. RADIATION: "Does the pain shoot anywhere else?" (e.g., chest, back)     none 3. ONSET: "When did the pain begin?" (e.g., minutes, hours or days ago)      Today- but happens sometimes - every since I had a colonoscopy & surgery, throat stretched, endoscopy 4. SUDDEN: "Gradual or sudden onset?"     Sudden onset 5. PATTERN "Does the pain come and go, or is it constant?"    - If it comes and goes: "How long does it last?" "Do you have pain now?"     (Note: Comes and goes means the pain is intermittent. It goes away completely between bouts.)    - If constant: "Is it getting better, staying the same, or getting worse?"      (Note: Constant means the pain never goes away completely; most serious pain is constant and gets worse.)      constant 6. SEVERITY: "How bad is the pain?"  (e.g., Scale 1-10; mild, moderate, or severe)    - MILD (1-3): Doesn't interfere with normal  activities, abdomen soft and not tender to touch.     - MODERATE (4-7): Interferes with normal activities or awakens from sleep, abdomen tender to touch.     - SEVERE (8-10): Excruciating pain, doubled over, unable to do any normal activities.       "Sickly pain" - feels like acid reflux - like I need to something on my stomach, bloating, feels like I need to throw up 7. RECURRENT SYMPTOM: "Have you ever had this type of stomach pain before?" If Yes, ask: "When was the last time?" and "What happened that time?"      N/a 8. CAUSE: "What do you think is causing the stomach pain?"     unknown 9. RELIEVING/AGGRAVATING FACTORS: "What makes it better or worse?" (e.g., antacids, bending or twisting motion, bowel movement)     N/a 10. OTHER SYMPTOMS: "Do you have any other symptoms?" (e.g., back pain, diarrhea, fever, urination pain, vomiting)       On ABX all the time because therefore gets thrush - feels like its down your throat.,  11. PREGNANCY: "Is there any chance you are pregnant?" "When was your last menstrual period?"       N/a  Protocols used: Abdominal Pain - [facility]

## 2023-11-29 ENCOUNTER — Ambulatory Visit: Payer: Self-pay | Admitting: Internal Medicine

## 2023-11-29 MED ORDER — NYSTATIN 100000 UNIT/ML MT SUSP: 5.0000 mL | Freq: Four times a day (QID) | OROMUCOSAL | 0 refills | Status: DC

## 2023-11-29 NOTE — Telephone Encounter (Signed)
  Chief Complaint: UTI Symptoms: pain with urination, naval pain, Frequency: 1-2 weeks Pertinent Negatives: Patient denies fever, SOB, chest pain Disposition: [] ED /[x] Urgent Care (no appt availability in office) / [] Appointment(In office/virtual)/ []  [facility]/ [] Home Care/ [] Refused Recommended Disposition /[] [facility]/ []  Follow-up with PCP Additional Notes: Patient c/o UTI and requesting abx. Reports she is having painful urination and naval pain. Denies fevers, SOB, chest pain. Of note, has had a lot of procedures this past year and reports that PCP usually writes her abx since she is supposed to be on one anyways. Triager reported that call requesting abx a few days ago is in PCP box for review. Triager could not guarantee that it would be addressed by end of day and encouraged visit to UC to be evaluated. Patient agreeable. Scheduled patient per protocol on 11/30/2023 at [facility]. Patient verbalized understanding and to call back with worsening symptoms.     Copied from CRM [id number]. Topic: Clinical - Red Word Triage >> Nov 29, 2023 11:50 AM Pascal Lux wrote: Red Word that prompted transfer to Nurse Triage: Patient stated she is experiencing a UTI with thrush, and is in pain. She is requesting an antibiotic. Reason for Disposition  Age > 50 years  Answer Assessment - Initial Assessment Questions 1. SYMPTOM: "What's the main symptom you're concerned about?" (e.g., frequency, incontinence)     UTI - reports cloudy, and painful urination 2. ONSET: "When did the  UTI  start?"     I dont know -- at least a week or two 3. PAIN: "Is there any pain?" If Yes, ask: "How bad is it?" (Scale: 1-10; mild, moderate, severe)     Pain in my navel 4. CAUSE: "What do you think is causing the symptoms?"     I dont know 5. OTHER SYMPTOMS: "Do you have any other symptoms?" (e.g., blood in urine, fever, flank pain, pain with urination)     Pain with urination I always have pain in my back   6. PREGNANCY: "Is there any chance you are pregnant?" "When was your last menstrual period?"     Not sexually active  Answer Assessment - Initial Assessment Questions 1. SEVERITY: "How bad is the pain?"  (e.g., Scale 1-10; mild, moderate, or severe)   - MILD (1-3): complains slightly about urination hurting   - MODERATE (4-7): interferes with normal activities     - SEVERE (8-10): excruciating, unwilling or unable to urinate because of the pain      moderate  4. ONSET: "When did the painful urination start?"      1-2 weeks ago 5. FEVER: "Do you have a fever?" If Yes, ask: "What is your temperature, how was it measured, and when did it start?"     no  7. CAUSE: "What do you think is causing the painful urination?"  (e.g., UTI, scratch, Herpes sore)     UTI 8. OTHER SYMPTOMS: "Do you have any other symptoms?" (e.g., blood in urine, flank pain, genital sores, urgency, vaginal discharge)     no  Protocols used: Urinary Symptoms-A-AH, Urination Pain - Female-A-AH

## 2023-11-29 NOTE — Telephone Encounter (Signed)
Tried to reach pt to inform her of providers instructions "Nystatin oral prescription. Please see me or one of my partners for other symptoms.  Go to urgent care/ER if urgent.  Thank you"

## 2023-11-29 NOTE — Addendum Note (Signed)
Addended by: Tresa Garter on: 11/29/2023 03:19 PM   Modules accepted: Orders

## 2023-11-29 NOTE — Telephone Encounter (Signed)
11/29/23- Called rec'd from patient asking to confirm her Urgent care appt time and RX that was sent today. Information provided.

## 2023-11-29 NOTE — Telephone Encounter (Signed)
Nystatin oral prescription. Please see me or one of my partners for other symptoms.  Go to urgent care/ER if urgent.  Thank you

## 2023-11-30 ENCOUNTER — Ambulatory Visit
Admission: RE | Admit: 2023-11-30 | Discharge: 2023-11-30 | Disposition: A | Discharge status: Home / Self Care | Payer: 59 | Source: Ambulatory Visit | Attending: Nurse Practitioner | Admitting: Nurse Practitioner

## 2023-11-30 VITALS — BP 170/84 | HR 66 | Temp 97.3°F | Resp 12

## 2023-11-30 DIAGNOSIS — N3000 Acute cystitis without hematuria: Secondary | ICD-10-CM | POA: Diagnosis not present

## 2023-11-30 DIAGNOSIS — Z87440 Personal history of urinary (tract) infections: Secondary | ICD-10-CM | POA: Diagnosis not present

## 2023-11-30 LAB — POCT URINALYSIS DIP (MANUAL ENTRY)
Bilirubin, UA: NEGATIVE
Glucose, UA: NEGATIVE mg/dL
Ketones, POC UA: NEGATIVE mg/dL
Nitrite, UA: POSITIVE — AB
Spec Grav, UA: 1.02 (ref 1.010–1.025)
Urobilinogen, UA: 0.2 U/dL
pH, UA: 5.5 (ref 5.0–8.0)

## 2023-11-30 MED ORDER — PHENAZOPYRIDINE HCL 100 MG PO TABS: 100.0000 mg | ORAL_TABLET | Freq: Three times a day (TID) | ORAL | 0 refills | Status: DC | PRN

## 2023-11-30 MED ORDER — NITROFURANTOIN MONOHYD MACRO 100 MG PO CAPS: 100.0000 mg | ORAL_CAPSULE | Freq: Two times a day (BID) | ORAL | 0 refills | Status: DC

## 2023-11-30 NOTE — ED Triage Notes (Signed)
Pt reports she has has painful urination, bladder pain, foul smelling odor, and cloudy x 2 weeks

## 2023-11-30 NOTE — Discharge Instructions (Addendum)
-  A urine culture is pending to ensure you are being treated with the appropriate antibiotic.  If the medication needs to be changed, you will be contacted. -Take medications as prescribed. -Increase fluids. -Ibuprofen or Tylenol for pain, fever, or general discomfort. -Develop a toileting schedule that will allow you to toilet at least every 2 hours. -Avoid caffeine to include tea, soda, and coffee. -If sexually active, void at least 15 to 20 minutes after sexual intercourse. -Follow-up in the emergency department if you develop fever, chills, worsening abdominal pain, or other concerns.  -Follow-up with your primary care physician if symptoms fail to improve. -Follow-up as needed.
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
## 2023-12-03 LAB — URINE CULTURE

## 2023-12-18 ENCOUNTER — Telehealth: Payer: Self-pay | Admitting: Internal Medicine

## 2023-12-18 NOTE — Telephone Encounter (Signed)
 There was no proper contact information left for me to reach out to [facility] with [facility] for potential Drug interaction.

## 2023-12-18 NOTE — Telephone Encounter (Signed)
 Copied from CRM [id number]. Topic: Clinical - Medication Question >> Dec 18, 2023 10:14 AM Ernst Spell wrote: Reason for CRM: Baldo Ash from [employer] called and requested to speak with a nurse regarding a potential drug interaction. He stated he would send over a fax to the office since no one was in the clinic today. Please advise.

## 2023-12-19 ENCOUNTER — Ambulatory Visit: Payer: 59 | Admitting: Internal Medicine

## 2023-12-20 ENCOUNTER — Ambulatory Visit (INDEPENDENT_AMBULATORY_CARE_PROVIDER_SITE_OTHER): Payer: 59 | Admitting: Family Medicine

## 2023-12-20 ENCOUNTER — Encounter: Payer: Self-pay | Admitting: Family Medicine

## 2023-12-20 VITALS — BP 138/80 | HR 80 | Temp 98.3°F | Ht 66.0 in | Wt 127.0 lb

## 2023-12-20 DIAGNOSIS — R300 Dysuria: Secondary | ICD-10-CM | POA: Diagnosis not present

## 2023-12-20 LAB — POCT URINALYSIS DIP (CLINITEK)
Bilirubin, UA: NEGATIVE
Glucose, UA: NEGATIVE mg/dL
Ketones, POC UA: NEGATIVE mg/dL
Leukocytes, UA: NEGATIVE
Nitrite, UA: NEGATIVE
POC PROTEIN,UA: NEGATIVE
Spec Grav, UA: >=1.03 — AB (ref 1.010–1.025)
Urobilinogen, UA: 0.2 U/dL
pH, UA: 6 (ref 5.0–8.0)

## 2023-12-20 NOTE — Progress Notes (Signed)
   Acute Office Visit  Subjective:     Patient ID: Melody [name], female    DOB: [birth year], 68 y.o.   MRN: [id number]  Chief Complaint  Patient presents with   Acute Visit    Started back this past week, burning with urination    HPI Patient is in today for evaluation of dysuria, for the last 2 weeks. Was treated with phenazopyridine as well as Macrobid. Reports that these medications made her feel very sick on her stomach, she is unsure whether it was the combination of medications or just one of the two. Also feels that her UTI is recurring, states that dysuria has come back over the last 4 days. Denies abdominal pain, diarrhea, rash, fever, chills, other symptoms.  Medical hx as outlined below.  ROS Per HPI      Objective:    BP 138/80 (BP Location: Left Arm, Patient Position: Sitting)   Pulse 80   Temp 98.3 F (36.8 C) (Temporal)   Ht 5\' 6"  (1.676 m)   Wt 127 lb (57.6 kg)   LMP 10/29/2006 (Approximate)   SpO2 98%   BMI 20.50 kg/m    Physical Exam Vitals and nursing note reviewed.  Constitutional:      General: She is not in acute distress.    Appearance: Normal appearance. She is normal weight.  HENT:     Head: Normocephalic and atraumatic.  Eyes:     Extraocular Movements: Extraocular movements intact.  Pulmonary:     Effort: Pulmonary effort is normal.  Abdominal:     General: There is no distension.     Palpations: Abdomen is soft. There is no mass.     Tenderness: There is no abdominal tenderness. There is no right CVA tenderness or left CVA tenderness.  Musculoskeletal:     Cervical back: Normal range of motion.  Skin:    General: Skin is warm and dry.  Neurological:     General: No focal deficit present.     Mental Status: She is alert.  Psychiatric:        Mood and Affect: Mood normal.        Behavior: Behavior normal.     Results for orders placed or performed in visit on 12/20/23  POCT URINALYSIS DIP (CLINITEK)  Result Value Ref  Range   Color, UA yellow yellow   Clarity, UA clear clear   Glucose, UA negative negative mg/dL   Bilirubin, UA negative negative   Ketones, POC UA negative negative mg/dL   Spec Grav, UA >=8.295 (A) 1.010 - 1.025   Blood, UA moderate (A) negative   pH, UA 6.0 5.0 - 8.0   POC PROTEIN,UA negative negative, trace   Urobilinogen, UA 0.2 0.2 or 1.0 E.U./dL   Nitrite, UA Negative Negative   Leukocytes, UA Negative Negative        Assessment & Plan:  1. Burning with urination (Primary)  - POCT URINALYSIS DIP (CLINITEK) - Urine Culture  I spent 25 minutes on this patient encounter including counseling, diagnosis, treatment options, documentation, face-to-face time.   No orders of the defined types were placed in this encounter.   Return if symptoms worsen or fail to improve.  Moshe Cipro, FNP

## 2023-12-20 NOTE — Patient Instructions (Signed)
 Urinalysis    Component Value Date/Time   COLORURINE YELLOW 08/23/2023 1647   APPEARANCEUR CLEAR 08/23/2023 1647   LABSPEC >=1.030 (A) 08/23/2023 1647   PHURINE 6.0 08/23/2023 1647   GLUCOSEU NEGATIVE 08/23/2023 1647   HGBUR TRACE-INTACT (A) 08/23/2023 1647   BILIRUBINUR negative 12/20/2023 1531   BILIRUBINUR n 04/27/2016 1513   KETONESUR negative 12/20/2023 1531   KETONESUR 15 (A) 08/23/2023 1647   PROTEINUR trace (A) 11/30/2023 1307   PROTEINUR NEGATIVE 04/04/2023 0041   UROBILINOGEN 0.2 12/20/2023 1531   UROBILINOGEN 0.2 08/23/2023 1647   NITRITE Negative 12/20/2023 1531   NITRITE NEGATIVE 08/23/2023 1647   LEUKOCYTESUR Negative 12/20/2023 1531   LEUKOCYTESUR TRACE (A) 08/23/2023 1647    We have also sent  in your urine for culture. If this is positive for infection, we will be in contact with you.  Follow-up with me for new or worsening symptoms.

## 2023-12-21 LAB — URINE CULTURE: Result:: NO GROWTH

## 2023-12-22 ENCOUNTER — Other Ambulatory Visit: Payer: Self-pay | Admitting: Internal Medicine

## 2023-12-23 ENCOUNTER — Ambulatory Visit: Payer: Self-pay | Admitting: Internal Medicine

## 2023-12-23 ENCOUNTER — Telehealth: Payer: Self-pay

## 2023-12-23 NOTE — Telephone Encounter (Unsigned)
 Copied from CRM [id number]. Topic: Clinical - Medication Question >> Dec 23, 2023  4:11 PM Sonny Dandy B wrote: Reason for CRM: Pt called to speak with Moshe Cipro FNP. Regarding a prescription for UTI. States she saw  her on 2/21 would like her to send a prescription for uti medication . Please call pt back at  [phone]

## 2023-12-23 NOTE — Telephone Encounter (Signed)
  Chief Complaint: hematuria Symptoms: pain with voiding, blood in urine, odor to urine Frequency: unclear Pertinent Negatives: Patient denies fever,  Disposition: [] ED /[] Urgent Care (no appt availability in office) / [x] Appointment(In office/virtual)/ []  [facility]/ [] Home Care/ [] Refused Recommended Disposition /[] [facility]/ []  Follow-up with PCP  Additional Notes: Pt seen 4 days ago for UTI s/s. Urine clear per NP SM. Pt states that she is having blood in her urine and that she is having burning when she pees along with ABD pain. Pt is unclear when this started pt states that this has been an ongoing issue. Pt states that she needs more abx at this time.  RN explained most recent urine culture showed no bacteria/growth, pt reiterates that she has blood in her urine and that she needs abx.  RN edu pt on bacteria/growth vs blood in urine sample and the role of abx.  Pt still unclear if s/s have restarted since last visit 4 days ago or if they never went away. Pt offered multiple appts tomorrow with NP and MD at PCP clinic.  Pt states "I don't know what good that is going to do, I was just there".  Pt states that "Its a long drive and I was just there", RN reiterates the need for appt. RN explained that she should be seen d/t ABD and urinary s/s and that a provider should reevaluate her.  Pt continues to talk about her phone being "demolished by [employer] and the doctor office and I cannot afford a new one until later because I am on social security".  Pt reiterates that she has been waiting for the provider she saw 4 days ago to text her. She states she is also asking her pcp to send her abx rx. RN attempted again to schedule pt, pt unsure if she wants to be sched. Pt requesting to see PCP, no availability until 3/11, pt asking if she can speak to the provider right now or if she can call back after hours. Pt eventually allowed RN to schedule her with NP in PCP office,  for tomorrow. Pt states she has been having UTIs and believes she has one now. RN explained most recent urine culture to the pt, pt still requesting abx.  Copied from CRM [id number]. Topic: Clinical - Red Word Triage >> Dec 23, 2023  4:15 PM Melody [name] wrote: Red Word that prompted transfer to Nurse Triage: Pt called stating she has blood in her  urine. Reason for Disposition  Bad or foul-smelling urine  Answer Assessment - Initial Assessment Questions 1. SYMPTOM: "What's the main symptom you're concerned about?" (e.g., frequency, incontinence)     Blood in urine 2. ONSET: "When did the  hematuria  start?"     I don't know  3. PAIN: "Is there any pain?" If Yes, ask: "How bad is it?" (Scale: 1-10; mild, moderate, severe)     Pain when I pee, it depends on how it is,  4. CAUSE: "What do you think is causing the symptoms?"     UTI 5. OTHER SYMPTOMS: "Do you have any other symptoms?" (e.g., blood in urine, fever, flank pain, pain with urination)     Blood in urine, pain with void, pain in lower ABD  Protocols used: Urinary Symptoms-A-AH

## 2023-12-24 ENCOUNTER — Encounter: Payer: Self-pay | Admitting: Family Medicine

## 2023-12-24 ENCOUNTER — Ambulatory Visit (INDEPENDENT_AMBULATORY_CARE_PROVIDER_SITE_OTHER): Payer: 59 | Admitting: Family Medicine

## 2023-12-24 VITALS — BP 120/78 | HR 79 | Temp 98.5°F | Ht 66.0 in | Wt 127.0 lb

## 2023-12-24 DIAGNOSIS — R3129 Other microscopic hematuria: Secondary | ICD-10-CM | POA: Diagnosis not present

## 2023-12-24 DIAGNOSIS — R102 Pelvic and perineal pain: Secondary | ICD-10-CM

## 2023-12-24 DIAGNOSIS — R1030 Lower abdominal pain, unspecified: Secondary | ICD-10-CM

## 2023-12-24 DIAGNOSIS — E2839 Other primary ovarian failure: Secondary | ICD-10-CM

## 2023-12-24 DIAGNOSIS — N898 Other specified noninflammatory disorders of vagina: Secondary | ICD-10-CM | POA: Diagnosis not present

## 2023-12-24 LAB — POCT URINALYSIS DIP (CLINITEK)
Bilirubin, UA: NEGATIVE
Blood, UA: NEGATIVE
Glucose, UA: NEGATIVE mg/dL
Ketones, POC UA: NEGATIVE mg/dL
Leukocytes, UA: NEGATIVE
Nitrite, UA: NEGATIVE
POC PROTEIN,UA: NEGATIVE
Spec Grav, UA: 1.025 (ref 1.010–1.025)
Urobilinogen, UA: 0.2 U/dL
pH, UA: 6 (ref 5.0–8.0)

## 2023-12-24 MED ORDER — ESTROGENS CONJUGATED 0.625 MG/GM VA CREA: TOPICAL_CREAM | VAGINAL | 12 refills | Status: DC

## 2023-12-24 NOTE — Patient Instructions (Addendum)
 We are checking labs today, will be in contact with any results that require further attention  We will treat if urine culture is positive, or if vaginal swab shows anything.   Follow-up with me for new or worsening symptoms.

## 2023-12-24 NOTE — Telephone Encounter (Signed)
 Patient will be visiting today for further evaluation.

## 2023-12-24 NOTE — Progress Notes (Signed)
   Acute Office Visit  Subjective:     Patient ID: Melody [name], female    DOB: 1955/11/12, 68 y.o.   MRN: [id number]  Chief Complaint  Patient presents with   Acute Visit    HPI Patient is in today for evaluation of low abdominal pain, dysuria, malodorous urine for the last 2 weeks. Was seen here by me x 4 days ago with negative urine culture.  Reports [place] vaginal discharge.  Reports urinary urgency. Denies gross hematuria, chills, fever, other symptoms. Medical hx as outlined below.   ROS Per HPI      Objective:    BP 120/78 (BP Location: Left Arm, Patient Position: Sitting)   Pulse 79   Temp 98.5 F (36.9 C) (Temporal)   Ht 5\' 6"  (1.676 m)   Wt 127 lb (57.6 kg)   LMP 10/29/2006 (Approximate)   SpO2 98%   BMI 20.50 kg/m    Physical Exam Exam conducted with a chaperone present Jolyne Loa, CMA).  Genitourinary:    Exam position: Supine.     Pubic Area: No rash.      Labia:        Right: No rash, tenderness or lesion.        Left: No rash, tenderness or lesion.      Urethra: Prolapse present. No urethral pain, urethral swelling or urethral lesion.     Comments: Nuswab obtained    Results for orders placed or performed in visit on 12/24/23  POCT URINALYSIS DIP (CLINITEK)  Result Value Ref Range   Color, UA yellow yellow   Clarity, UA clear clear   Glucose, UA negative negative mg/dL   Bilirubin, UA negative negative   Ketones, POC UA negative negative mg/dL   Spec Grav, UA 6.213 0.865 - 1.025   Blood, UA negative negative   pH, UA 6.0 5.0 - 8.0   POC PROTEIN,UA negative negative, trace   Urobilinogen, UA 0.2 0.2 or 1.0 E.U./dL   Nitrite, UA Negative Negative   Leukocytes, UA Negative Negative        Assessment & Plan:  1. Lower abdominal pain (Primary)  - NuSwab Vaginitis Plus (VG+); Future - Urine Culture - POCT URINALYSIS DIP (CLINITEK) - NuSwab Vaginitis Plus (VG+)  2. Vaginal discharge  - NuSwab Vaginitis Plus (VG+); Future -  Urine Culture - POCT URINALYSIS DIP (CLINITEK) - NuSwab Vaginitis Plus (VG+)  3. Estrogen deficiency  - conjugated estrogens (PREMARIN) vaginal cream; 0.5 g of cream intravaginally once daily for 2 weeks, then one application twice weekly  Dispense: 30 g; Refill: 12   Meds ordered this encounter  Medications   conjugated estrogens (PREMARIN) vaginal cream    Sig: 0.5 g of cream intravaginally once daily for 2 weeks, then one application twice weekly    Dispense:  30 g    Refill:  12   Will treat based on lab results as needed.    Return if symptoms worsen or fail to improve.  Moshe Cipro, FNP

## 2023-12-25 LAB — URINE CULTURE: Result:: NO GROWTH

## 2023-12-26 LAB — NUSWAB VAGINITIS PLUS (VG+)
Candida albicans, NAA: NEGATIVE
Candida glabrata, NAA: NEGATIVE
Chlamydia trachomatis, NAA: NEGATIVE
Neisseria gonorrhoeae, NAA: NEGATIVE
Trich vag by NAA: NEGATIVE

## 2023-12-27 NOTE — Addendum Note (Signed)
 Addended by: Sherald Barge on: 12/27/2023 09:00 AM   Modules accepted: Orders

## 2023-12-30 ENCOUNTER — Telehealth: Payer: Self-pay | Admitting: Internal Medicine

## 2023-12-30 DIAGNOSIS — R3129 Other microscopic hematuria: Secondary | ICD-10-CM

## 2023-12-30 DIAGNOSIS — R1030 Lower abdominal pain, unspecified: Secondary | ICD-10-CM

## 2023-12-30 DIAGNOSIS — R102 Pelvic and perineal pain: Secondary | ICD-10-CM

## 2023-12-30 NOTE — Telephone Encounter (Signed)
 The CT scan was ordered by [facility].  Please ask Melody [name].  Thank you

## 2023-12-30 NOTE — Telephone Encounter (Signed)
 Copied from CRM [id number]. Topic: Referral - Question >> Dec 30, 2023 10:17 AM Melody [name] wrote: Reason for CRM: Patient called in to state that the place she was referred to for a CT Scan stated someone advised her the last appointment was taken and that she need another place to be seen at & would like to be seen this week, also would like to know if she could go to [facility] to have CT scan done. Please call [phone]

## 2023-12-31 NOTE — Telephone Encounter (Signed)
 Copied from CRM [id number]. Topic: Referral - Question >> Dec 30, 2023 10:17 AM Deaijah H wrote: Reason for CRM: Patient called in to state that the place she was referred to for a CT Scan stated someone advised her the last appointment was taken and that she need another place to be seen at & would like to be seen this week, also would like to know if she could go to [facility] to have CT scan done. Please call [phone] >> Dec 31, 2023  4:25 PM Corin V wrote: Patient is calling and asking for an update on getting the CT done this week. She is asking for a call back tomorrow with an update on if the CT is approved by insurance and if [facility] is still doing CT scans done. Please call [phone] to schedule with [facility]. Please call with any update on this regardless of where it is at in the process.

## 2024-01-02 NOTE — Telephone Encounter (Signed)
 Spoke with patient, she is scheduled with imaging

## 2024-01-02 NOTE — Telephone Encounter (Signed)
 Left voicemail and sent MyChart message for patient - she is welcome to call [facility] at her convenience to schedule. Number for scheduling is [phone].

## 2024-01-03 ENCOUNTER — Ambulatory Visit (HOSPITAL_COMMUNITY)
Admission: RE | Admit: 2024-01-03 | Discharge: 2024-01-03 | Disposition: A | Discharge status: Home / Self Care | Source: Ambulatory Visit | Attending: Family Medicine | Admitting: Family Medicine

## 2024-01-03 DIAGNOSIS — R3129 Other microscopic hematuria: Secondary | ICD-10-CM | POA: Insufficient documentation

## 2024-01-03 DIAGNOSIS — R1030 Lower abdominal pain, unspecified: Secondary | ICD-10-CM | POA: Insufficient documentation

## 2024-01-03 DIAGNOSIS — R102 Pelvic and perineal pain: Secondary | ICD-10-CM | POA: Insufficient documentation

## 2024-01-03 DIAGNOSIS — N8110 Cystocele, unspecified: Secondary | ICD-10-CM | POA: Diagnosis not present

## 2024-01-03 DIAGNOSIS — R109 Unspecified abdominal pain: Secondary | ICD-10-CM | POA: Diagnosis not present

## 2024-01-03 DIAGNOSIS — N200 Calculus of kidney: Secondary | ICD-10-CM | POA: Diagnosis not present

## 2024-01-03 DIAGNOSIS — K5730 Diverticulosis of large intestine without perforation or abscess without bleeding: Secondary | ICD-10-CM | POA: Diagnosis not present

## 2024-01-06 ENCOUNTER — Other Ambulatory Visit (HOSPITAL_COMMUNITY): Payer: Self-pay | Admitting: Family Medicine

## 2024-01-06 DIAGNOSIS — R102 Pelvic and perineal pain unspecified side: Secondary | ICD-10-CM

## 2024-01-06 DIAGNOSIS — N200 Calculus of kidney: Secondary | ICD-10-CM

## 2024-01-06 DIAGNOSIS — R918 Other nonspecific abnormal finding of lung field: Secondary | ICD-10-CM

## 2024-01-06 DIAGNOSIS — J479 Bronchiectasis, uncomplicated: Secondary | ICD-10-CM

## 2024-01-06 DIAGNOSIS — N814 Uterovaginal prolapse, unspecified: Secondary | ICD-10-CM

## 2024-01-06 DIAGNOSIS — I700 Atherosclerosis of aorta: Secondary | ICD-10-CM

## 2024-01-07 NOTE — Telephone Encounter (Signed)
 Attempted to reach patient, Melody [name] for her to return call. She can see whomever in the office who may assist with her bladder tact

## 2024-01-07 NOTE — Telephone Encounter (Signed)
 Copied from CRM [id number]. Topic: Referral - Question >> Jan 07, 2024 12:26 PM Chantha C wrote: Reason for CRM: Patient spoke with Rolland Bimler and referred to see Dr. Charlotta Newton. When patient contact Dr. Lawana Chambers office, she was told Dr. Charlotta Newton does not perform bladder tact back up and someone in the office told patient about  Dr. Duane Lope. Patient is confused with what to do and needs guidance, Please advise and call back at [phone].

## 2024-01-08 ENCOUNTER — Telehealth: Admitting: Family Medicine

## 2024-01-08 NOTE — Progress Notes (Deleted)
   Acute Office Visit  Subjective:     Patient ID: Melody [name], female    DOB: 04-25-1956, 68 y.o.   MRN: [id number]  No chief complaint on file.   HPI Patient is in today for evaluation of abdominal pain.   ROS Per HPI      Objective:    LMP 10/29/2006 (Approximate)    Physical Exam Vitals and nursing note reviewed.  Constitutional:      Appearance: Normal appearance. She is normal weight.  HENT:     Head: Normocephalic and atraumatic.     Right Ear: Tympanic membrane and ear canal normal.     Left Ear: Tympanic membrane and ear canal normal.     Nose: Nose normal.  Eyes:     Extraocular Movements: Extraocular movements intact.     Pupils: Pupils are equal, round, and reactive to [place].  Cardiovascular:     Rate and Rhythm: Normal rate and regular rhythm.     Heart sounds: Normal heart sounds.  Pulmonary:     Effort: Pulmonary effort is normal.     Breath sounds: Normal breath sounds.  Musculoskeletal:        General: Normal range of motion.     Cervical back: Normal range of motion.  Neurological:     General: No focal deficit present.     Mental Status: She is alert and oriented to person, place, and time.  Psychiatric:        Mood and Affect: Mood normal.        Thought Content: Thought content normal.   No results found for any visits on 01/08/24.      Assessment & Plan:   There are no diagnoses linked to this encounter.   No orders of the defined types were placed in this encounter.   No follow-ups on file.  Moshe Cipro, FNP

## 2024-01-16 ENCOUNTER — Encounter: Payer: Self-pay | Admitting: Obstetrics & Gynecology

## 2024-01-16 ENCOUNTER — Ambulatory Visit: Admitting: Obstetrics & Gynecology

## 2024-01-16 VITALS — BP 150/90 | HR 71 | Ht 66.0 in | Wt 125.0 lb

## 2024-01-16 DIAGNOSIS — N958 Other specified menopausal and perimenopausal disorders: Secondary | ICD-10-CM

## 2024-01-16 DIAGNOSIS — N390 Urinary tract infection, site not specified: Secondary | ICD-10-CM

## 2024-01-16 DIAGNOSIS — E2839 Other primary ovarian failure: Secondary | ICD-10-CM | POA: Diagnosis not present

## 2024-01-16 DIAGNOSIS — N952 Postmenopausal atrophic vaginitis: Secondary | ICD-10-CM | POA: Diagnosis not present

## 2024-01-16 MED ORDER — ESTROGENS CONJUGATED 0.625 MG/GM VA CREA: TOPICAL_CREAM | VAGINAL | 12 refills | Status: DC

## 2024-01-16 MED ORDER — NYSTATIN 100000 UNIT/ML MT SUSP: OROMUCOSAL | 0 refills | Status: DC

## 2024-01-16 MED ORDER — NITROFURANTOIN MACROCRYSTAL 50 MG PO CAPS: 50.0000 mg | ORAL_CAPSULE | Freq: Every day | ORAL | 3 refills | Status: DC

## 2024-01-16 NOTE — Progress Notes (Signed)
 Chief Complaint  Patient presents with   Bladder Prolapse      68 y.o. [id number] Patient's last menstrual period was 10/29/2006 (approximate). The current method of family planning is status post hysterectomy.  Outpatient Encounter Medications as of 01/16/2024  Medication Sig   albuterol (VENTOLIN HFA) 108 (90 Base) MCG/ACT inhaler INHALE TWO PUFFS BY MOUTH EVERY 4 HOURS AS NEEDED FOR wheezing OR SHORTNESS OF BREATH (Patient taking differently: Inhale 2 puffs into the lungs every 4 (four) hours as needed for shortness of breath or wheezing.)   ALPRAZolam (XANAX) 1 MG tablet Take 1 mg by mouth 3 (three) times daily.   Cholecalciferol (VITAMIN D3) 1.25 MG (50000 UT) CAPS TAKE ONE CAPSULE BY MOUTH EVERY 14 DAYS ON TUESDAYS (Patient taking differently: Take 1 capsule by mouth every 14 (fourteen) days.)   cyanocobalamin (VITAMIN B12) 1000 MCG/ML injection INJECT INTO THE SKIN EVERY 14 DAYS (Patient taking differently: Inject 1,000 mcg into the muscle every 14 (fourteen) days.)   diclofenac Sodium (VOLTAREN) 1 % GEL APPLY TWO GRAMS TO AFFECTED AREA(S) FOUR TIMES DAILY (Patient taking differently: Apply 2 g topically 4 (four) times daily as needed (shoulder pain).)   fluticasone (FLONASE) 50 MCG/ACT nasal spray INSTILL TWO SPRAYS IN EACH NOSTRIL DAILY   levocetirizine (XYZAL) 5 MG tablet TAKE 1 TABLET BY MOUTH EVERY EVENING (FOR ALLERGY) (Patient taking differently: Take 5 mg by mouth daily as needed for allergies.)   levothyroxine (SYNTHROID) 50 MCG tablet TAKE 1 TABLET BY MOUTH EVERY MORNING   losartan (COZAAR) 25 MG tablet TAKE 1 TABLET BY MOUTH DAILY   magnesium oxide (MAG-OX) 400 (240 Mg) MG tablet TAKE 1 TABLET BY MOUTH ONCE daily AT NOON (Patient taking differently: Take 400 mg by mouth daily.)   methylphenidate (RITALIN) 20 MG tablet Take 1 tablet (20 mg total) by mouth daily. (Patient taking differently: Take 10-20 mg by mouth daily.)   nitrofurantoin (MACRODANTIN) 50 MG capsule  Take 1 capsule (50 mg total) by mouth at bedtime.   nystatin (MYCOSTATIN) 100000 UNIT/ML suspension Swish spit 3 times a day   omeprazole (PRILOSEC) 40 MG capsule TAKE ONE CAPSULE BY MOUTH EVERY MORNING and TAKE ONE CAPSULE EVERY EVENING (Patient taking differently: Take 40 mg by mouth in the morning and at bedtime.)   oxybutynin (DITROPAN-XL) 10 MG 24 hr tablet Take 1 tablet (10 mg total) by mouth at bedtime. Follow-up appt due in Nov must see provider for future refills   oxyCODONE-acetaminophen (PERCOCET/ROXICET) 5-325 MG tablet TAKE 1 TABLET BY MOUTH EVERY 8 HOURS AS NEEDED FOR SEVERE pain (Patient taking differently: Take 1 tablet by mouth every 8 (eight) hours as needed for severe pain (pain score 7-10).)   pantoprazole (PROTONIX) 40 MG tablet Take 1 tablet (40 mg total) by mouth 2 (two) times daily.   potassium chloride SA (KLOR-CON M) 20 MEQ tablet TAKE 1 TABLET BY MOUTH ONCE DAILY AT NOON (Patient taking differently: Take 20 mEq by mouth daily.)   QUEtiapine (SEROQUEL) 25 MG tablet Take 25 mg by mouth at bedtime.   SPIRIVA RESPIMAT 2.5 MCG/ACT AERS INHALE TWO PUFFS BY MOUTH EVERY DAY (Patient taking differently: Inhale 2 puffs into the lungs daily.)   [DISCONTINUED] conjugated estrogens (PREMARIN) vaginal cream 0.5 g of cream intravaginally once daily for 2 weeks, then one application twice weekly   conjugated estrogens (PREMARIN) vaginal cream 0.5 g of cream intravaginally once daily for 2 weeks, then one application twice weekly   guaiFENesin-dextromethorphan (ROBITUSSIN DM) 100-10  MG/5ML syrup Take 5 mLs by mouth every 4 (four) hours as needed for cough. (Patient not taking: Reported on 01/16/2024)   nystatin (MYCOSTATIN) 100000 UNIT/ML suspension Take 5 mLs (500,000 Units total) by mouth 4 (four) times daily. Swish hold and swallow. (Patient not taking: Reported on 01/16/2024)   No facility-administered encounter medications on file as of 01/16/2024.    Subjective Pt is seen for concern  for bladder prolapse She is s/p pelvic reconstruction 6 years or so ago by Dr Edward Jolly She had a traumatic sexual encounter that she thinks contributed possibly She as also had dysuria symptoms with only 2 documented UTIs since last May although several treatments  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[facility])    Osteoarthritis    Pneumonia    Pulmonary nodule    Vertigo    Vitamin B12 deficiency     Past Surgical History:  Procedure Laterality Date   ABDOMINAL HYSTERECTOMY N/A 01/10/2016   Procedure: HYSTERECTOMY ABDOMINAL;  Surgeon: Patton Salles, MD;  Location: [facility];  Service: Gynecology;  Laterality: N/A;   ABDOMINAL SACROCOLPOPEXY N/A 01/10/2016   Procedure: ABDOMINO SACROCOLPOPEXY ;  Surgeon: Patton Salles, MD;  Location: [facility];  Service: Gynecology;  Laterality: N/A;   ANTERIOR AND POSTERIOR REPAIR N/A 01/10/2016   Procedure:  POSTERIOR REPAIR (RECTOCELE);  Surgeon: Patton Salles, MD;  Location: [facility];  Service: Gynecology;  Laterality: N/A;   BIOPSY  10/19/2022   Procedure: BIOPSY;  Surgeon: Dolores Frame, MD;  [facility];  Service: Gastroenterology;;   BLADDER SUSPENSION N/A 01/10/2016   Procedure: TRANSVAGINAL TAPE (TVT) PROCEDURE exact midurethral sling;  Surgeon: Patton Salles, MD;  Location: [facility];  Service: Gynecology;  Laterality: N/A;   CERVICAL LAMINECTOMY  2001 and 2005   Dr Lovell Sheehan    ACDF  C5-6, C6-7   CHOLECYSTECTOMY N/A 09/23/2023   Procedure: LAPAROSCOPIC  CHOLECYSTECTOMY;  Surgeon: Berna Bue, MD;  Location: [facility];  Service: General;  Laterality: N/A;   COLONOSCOPY WITH PROPOFOL N/A 10/19/2022   Procedure: COLONOSCOPY WITH PROPOFOL;  Surgeon: Dolores Frame, MD;  [facility];  Service: Gastroenterology;  Laterality: N/A;  10:30am, asa 1-2   CYSTO N/A 01/10/2016   Procedure: CYSTOSCOPY;  Surgeon: Patton Salles, MD;  Location: [facility];  Service: Gynecology;  Laterality: N/A;   ESOPHAGEAL DILATION N/A 10/19/2022   Procedure: ESOPHAGEAL DILATION;  Surgeon: Dolores Frame, MD;  [facility];  Service: Gastroenterology;  Laterality: N/A;   ESOPHAGOGASTRODUODENOSCOPY (EGD) WITH PROPOFOL N/A 10/19/2022   Procedure: ESOPHAGOGASTRODUODENOSCOPY (EGD) WITH PROPOFOL;  Surgeon: Dolores Frame, MD;  [facility];  Service: Gastroenterology;  Laterality: N/A;   POLYPECTOMY  10/19/2022   Procedure: POLYPECTOMY INTESTINAL;  Surgeon: Dolores Frame, MD;  [facility];  Service: Gastroenterology;;   SALPINGOOPHORECTOMY Bilateral 01/10/2016   Procedure:  BILATERAL SALPINGO OOPHORECTOMY;  Surgeon: Patton Salles, MD;  Location: [facility];  Service: Gynecology;  Laterality: Bilateral;   SAVORY DILATION  10/19/2022   Procedure: SAVORY DILATION;  Surgeon: Marguerita Merles, Reuel Boom, MD;  [facility];  Service: Gastroenterology;;   TUBAL LIGATION  10/29/1978    OB History     Gravida  2   Para  2   Term  2   Preterm      AB  0   Living  2      SAB  0   IAB      Ectopic  0   Multiple      Live Births              Allergies  Allergen Reactions   Flagyl [Metronidazole] Shortness Of Breath    Made tongue turn dark red, was not able to breath.   Doxycycline Hyclate Itching   Dulera [Mometasone Furo-Formoterol Fum]     thrush   Gabapentin     Headaches: Patient is not aware of the reaction to this medication    Nabumetone Other (See Comments)    Headache   Sulfa Antibiotics Other (See Comments)    Reaction is unknown   Tetracycline Hcl Itching   Tramadol Hcl Other (See Comments)    Social History   Socioeconomic History   Marital status: Legally Separated    Spouse name: Not on file   Number of children: Not on file   Years of education: Not on file   Highest education level: Not on file  Occupational History   Not on file  Tobacco Use   Smoking status: Former    Current packs/day: 0.00    Average packs/day: 1 pack/day for 48.5 years (48.5 ttl pk-yrs)    Types: Cigarettes    Start date: 11/21/1962    Quit date: 05/06/2011    Years since quitting: 12.7   Smokeless tobacco: Never   Tobacco comments:    stopped periodically - peak rate of 1 ppd  Vaping Use   Vaping status: Never Used  Substance and Sexual Activity   Alcohol use: No    Alcohol/week: 0.0 standard drinks of alcohol   Drug use: No   Sexual activity: Not Currently    Birth control/protection: Post-menopausal, Surgical    Comment: Tubal/Hyst/BSO  Other Topics Concern   Not on file  Social History Narrative   [facility] (04/04/17):   Originally from [place], [place]. 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 Recently had a persian cat that passed. No bird exposure. She denies any indoor plants currently. She does use wood burning for heat. She reports there is water getting under her foundation. She has mold in her home that she reports is in the 2nd floor of her home. She has recently started seeing mold under her bed. She reports her home smells musty & damp. She has always lived in [place].    Social Drivers of Health   Financial Resource Strain: High Risk (01/16/2024)   Overall Financial Resource Strain (CARDIA)    Difficulty of Paying Living Expenses: Hard  Food Insecurity: Food Insecurity Present (01/16/2024)   Hunger Vital Sign    Worried About Running Out of Food in  the Last Year: Never true    Ran Out of Food in the Last Year: Sometimes true  Transportation Needs: No Transportation Needs (01/16/2024)   PRAPARE -  Administrator, Civil Service (Medical): No    Lack of Transportation (Non-Medical): No  Physical Activity: Insufficiently Active (01/16/2024)   Exercise Vital Sign    Days of Exercise per Week: 1 day    Minutes of Exercise per Session: 10 min  Stress: Stress Concern Present (01/16/2024)   [employer] of Occupational Health - Occupational Stress Questionnaire    Feeling of Stress : Very much  Social Connections: Moderately Isolated (01/16/2024)   Social Connection and Isolation Panel [NHANES]    Frequency of Communication with Friends and Family: More than three times a week    Frequency of Social Gatherings with Friends and Family: Three times a week    Attends Religious Services: More than 4 times per year    Active Member of Clubs or Organizations: No    Attends Banker Meetings: Never    Marital Status: Separated    Family History  Problem Relation Age of Onset   Heart attack Father        dec heart disease age 1   Hypertension Father    Lung disease Father    Cancer Mother        dec stomach ca--age 5   Breast cancer Cousin    Arthritis Other    Breast cancer Paternal Aunt     Medications:       Current Outpatient Medications:    albuterol (VENTOLIN HFA) 108 (90 Base) MCG/ACT inhaler, INHALE TWO PUFFS BY MOUTH EVERY 4 HOURS AS NEEDED FOR wheezing OR SHORTNESS OF BREATH (Patient taking differently: Inhale 2 puffs into the lungs every 4 (four) hours as needed for shortness of breath or wheezing.), Disp: 8.5 g, Rfl: 5   ALPRAZolam (XANAX) 1 MG tablet, Take 1 mg by mouth 3 (three) times daily., Disp: , Rfl:    Cholecalciferol (VITAMIN D3) 1.25 MG (50000 UT) CAPS, TAKE ONE CAPSULE BY MOUTH EVERY 14 DAYS ON TUESDAYS (Patient taking differently: Take 1 capsule by mouth every 14 (fourteen) days.), Disp: 6  capsule, Rfl: 3   cyanocobalamin (VITAMIN B12) 1000 MCG/ML injection, INJECT INTO THE SKIN EVERY 14 DAYS (Patient taking differently: Inject 1,000 mcg into the muscle every 14 (fourteen) days.), Disp: 6 mL, Rfl: 3   diclofenac Sodium (VOLTAREN) 1 % GEL, APPLY TWO GRAMS TO AFFECTED AREA(S) FOUR TIMES DAILY (Patient taking differently: Apply 2 g topically 4 (four) times daily as needed (shoulder pain).), Disp: 200 g, Rfl: 3   fluticasone (FLONASE) 50 MCG/ACT nasal spray, INSTILL TWO SPRAYS IN EACH NOSTRIL DAILY, Disp: 48 g, Rfl: 3   levocetirizine (XYZAL) 5 MG tablet, TAKE 1 TABLET BY MOUTH EVERY EVENING (FOR ALLERGY) (Patient taking differently: Take 5 mg by mouth daily as needed for allergies.), Disp: 90 tablet, Rfl: 3   levothyroxine (SYNTHROID) 50 MCG tablet, TAKE 1 TABLET BY MOUTH EVERY MORNING, Disp: 90 tablet, Rfl: 3   losartan (COZAAR) 25 MG tablet, TAKE 1 TABLET BY MOUTH DAILY, Disp: 30 tablet, Rfl: 5   magnesium oxide (MAG-OX) 400 (240 Mg) MG tablet, TAKE 1 TABLET BY MOUTH ONCE daily AT NOON (Patient taking differently: Take 400 mg by mouth daily.), Disp: 30 tablet, Rfl: 3   methylphenidate (RITALIN) 20 MG tablet, Take 1 tablet (20 mg total) by mouth daily. (Patient taking differently: Take 10-20 mg by mouth daily.), Disp: , Rfl: 0   nitrofurantoin (MACRODANTIN) 50 MG capsule, Take 1 capsule (50 mg total) by mouth at bedtime., Disp: 30 capsule, Rfl: 3  nystatin (MYCOSTATIN) 100000 UNIT/ML suspension, Swish spit 3 times a day, Disp: 60 mL, Rfl: 0   omeprazole (PRILOSEC) 40 MG capsule, TAKE ONE CAPSULE BY MOUTH EVERY MORNING and TAKE ONE CAPSULE EVERY EVENING (Patient taking differently: Take 40 mg by mouth in the morning and at bedtime.), Disp: 60 capsule, Rfl: 0   oxybutynin (DITROPAN-XL) 10 MG 24 hr tablet, Take 1 tablet (10 mg total) by mouth at bedtime. Follow-up appt due in Nov must see provider for future refills, Disp: 30 tablet, Rfl: 5   oxyCODONE-acetaminophen (PERCOCET/ROXICET)  5-325 MG tablet, TAKE 1 TABLET BY MOUTH EVERY 8 HOURS AS NEEDED FOR SEVERE pain (Patient taking differently: Take 1 tablet by mouth every 8 (eight) hours as needed for severe pain (pain score 7-10).), Disp: 60 tablet, Rfl: 0   pantoprazole (PROTONIX) 40 MG tablet, Take 1 tablet (40 mg total) by mouth 2 (two) times daily., Disp: 60 tablet, Rfl: 5   potassium chloride SA (KLOR-CON M) 20 MEQ tablet, TAKE 1 TABLET BY MOUTH ONCE DAILY AT NOON (Patient taking differently: Take 20 mEq by mouth daily.), Disp: 30 tablet, Rfl: 3   QUEtiapine (SEROQUEL) 25 MG tablet, Take 25 mg by mouth at bedtime., Disp: , Rfl:    SPIRIVA RESPIMAT 2.5 MCG/ACT AERS, INHALE TWO PUFFS BY MOUTH EVERY DAY (Patient taking differently: Inhale 2 puffs into the lungs daily.), Disp: 4 g, Rfl: 11   conjugated estrogens (PREMARIN) vaginal cream, 0.5 g of cream intravaginally once daily for 2 weeks, then one application twice weekly, Disp: 30 g, Rfl: 12   guaiFENesin-dextromethorphan (ROBITUSSIN DM) 100-10 MG/5ML syrup, Take 5 mLs by mouth every 4 (four) hours as needed for cough. (Patient not taking: Reported on 01/16/2024), Disp: 118 mL, Rfl: 0   nystatin (MYCOSTATIN) 100000 UNIT/ML suspension, Take 5 mLs (500,000 Units total) by mouth 4 (four) times daily. Swish hold and swallow. (Patient not taking: Reported on 01/16/2024), Disp: 60 mL, Rfl: 0  Objective Blood pressure (!) 150/90, pulse 71, height 5\' 6"  (1.676 m), weight 125 lb (56.7 kg), last menstrual period 10/29/2006.  General [facility] female NAD Vulva:  normal appearing vulva with no masses, tenderness or lesions, bladder is well supported Vagina:  atrophic mucosa, no discharge Cervix:  absent Uterus:  absent Adnexa: ovaries:negative for masses   Pertinent ROS   Labs or studies reviewed    Impression + Management Plan: Diagnoses this Encounter::   ICD-10-CM   1. 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 Estrogen deficiency  E28.39 conjugated estrogens (PREMARIN)  vaginal cream    3. Vaginal atrophy  N95.2     4. Recurrent UTI  N39.0         Medications prescribed during  this encounter: Meds ordered this encounter  Medications   nitrofurantoin (MACRODANTIN) 50 MG capsule    Sig: Take 1 capsule (50 mg total) by mouth at bedtime.    Dispense:  30 capsule    Refill:  3   conjugated estrogens (PREMARIN) vaginal cream    Sig: 0.5 g of cream intravaginally once daily for 2 weeks, then one application twice weekly    Dispense:  30 g    Refill:  12   nystatin (MYCOSTATIN) 100000 UNIT/ML suspension    Sig: Swish spit 3 times a day    Dispense:  60 mL    Refill:  0    Labs or Scans Ordered during this encounter: No orders of the defined types were placed in this encounter.  Follow up Return in about 3 months (around 04/17/2024), or if symptoms worsen or fail to improve, for Follow up, with Dr Despina Hidden.

## 2024-01-17 ENCOUNTER — Other Ambulatory Visit: Payer: Self-pay | Admitting: Obstetrics & Gynecology

## 2024-01-20 ENCOUNTER — Other Ambulatory Visit: Payer: Self-pay | Admitting: Internal Medicine

## 2024-01-20 ENCOUNTER — Telehealth: Payer: Self-pay | Admitting: Obstetrics & Gynecology

## 2024-01-20 NOTE — Telephone Encounter (Signed)
 Returned patient's call.  States she started the Premarin cream on Thursday and seemed to be doing fine until this morning she woke up with vaginal swelling.  Urine also seems to just be dribbling out and she then has to go lay down for a few minutes, then gets back up to try again.  Has not had this problem before and is taking the Macrobid as prescribed.  Advised to stop using premarin cream until hearing from Dr Despina Hidden.

## 2024-01-20 NOTE — Telephone Encounter (Signed)
 Pt states she is very uncomfortable. States her vagina is swollen and is unable to urinate. Pt feels bladder is full.

## 2024-01-21 ENCOUNTER — Other Ambulatory Visit: Payer: Self-pay | Admitting: Obstetrics & Gynecology

## 2024-01-21 ENCOUNTER — Telehealth: Payer: Self-pay | Admitting: Obstetrics & Gynecology

## 2024-01-21 MED ORDER — ESTRADIOL 0.1 MG/GM VA CREA: TOPICAL_CREAM | VAGINAL | 12 refills | Status: DC

## 2024-01-21 NOTE — Telephone Encounter (Signed)
 Patient didn't apply cream last night and swelling has went down. Please advise.

## 2024-01-30 ENCOUNTER — Ambulatory Visit: Admitting: Internal Medicine

## 2024-01-30 ENCOUNTER — Encounter: Admitting: Obstetrics & Gynecology

## 2024-01-31 ENCOUNTER — Other Ambulatory Visit: Payer: Self-pay | Admitting: Internal Medicine

## 2024-02-03 ENCOUNTER — Ambulatory Visit: Payer: Self-pay

## 2024-02-03 NOTE — Telephone Encounter (Signed)
  Chief Complaint: abdominal pain Symptoms: abdominal cramping, diarrhea Frequency: worsening since surgery Pertinent Negatives: Patient denies blood in stool, vomiting, chest pain Disposition: [] ED /[] Urgent Care (no appt availability in office) / [x] Appointment(In office/virtual)/ []  [facility]/ [] Home Care/ [] Refused Recommended Disposition /[] [facility]/ []  Follow-up with PCP Additional Notes: Patient reports abdominal pain in the lower middle of her abdomen. Patient reports she had her gallbladder removed in November and that the pain has been worsening ever since. Over the past week patient reports pain is severe and is experiencing diarrhea. Attempted to schedule patient appt today, patient states she is not available until Wednesday. Appt scheduled for Wednesday 02/05/24 at patients request. Patient advised to call back with worsening symptoms. Patient verbalized understanding.     Copied from CRM [id number]. Topic: Clinical - Red Word Triage >> Feb 03, 2024 11:06 AM Geroge Baseman wrote: Red Word that prompted transfer to Nurse Triage: patient having severe pain after gallbladder surgery Reason for Disposition  Abdominal pain is a chronic symptom (recurrent or ongoing AND present > 4 weeks)  Answer Assessment - Initial Assessment Questions 1. LOCATION: "Where does it hurt?"      Right in the middle below naval 2. RADIATION: "Does the pain shoot anywhere else?" (e.g., chest, back)     Right side 3. ONSET: "When did the pain begin?" (e.g., minutes, hours or days ago)      Gallbladder removed in november, worsening since 4. SUDDEN: "Gradual or sudden onset?"     gradual 5. PATTERN "Does the pain come and go, or is it constant?"    - If it comes and goes: "How long does it last?" "Do you have pain now?"     (Note: Comes and goes means the pain is intermittent. It goes away completely between bouts.)    - If constant: "Is it getting better, staying the same, or getting  worse?"      (Note: Constant means the pain never goes away completely; most serious pain is constant and gets worse.)      constant 6. SEVERITY: "How bad is the pain?"  (e.g., Scale 1-10; mild, moderate, or severe)    - MILD (1-3): Doesn't interfere with normal activities, abdomen soft and not tender to touch.     - MODERATE (4-7): Interferes with normal activities or awakens from sleep, abdomen tender to touch.     - SEVERE (8-10): Excruciating pain, doubled over, unable to do any normal activities.       severe 7. RECURRENT SYMPTOM: "Have you ever had this type of stomach pain before?" If Yes, ask: "When was the last time?" and "What happened that time?"      none 8. CAUSE: "What do you think is causing the stomach pain?"     Gallbladder removed 9. RELIEVING/AGGRAVATING FACTORS: "What makes it better or worse?" (e.g., antacids, bending or twisting motion, bowel movement)     nothing 10. OTHER SYMPTOMS: "Do you have any other symptoms?" (e.g., back pain, diarrhea, fever, urination pain, vomiting)       diarrhea  Protocols used: Abdominal Pain - Female-A-AH

## 2024-02-04 ENCOUNTER — Encounter: Payer: Self-pay | Admitting: Internal Medicine

## 2024-02-04 NOTE — Progress Notes (Unsigned)
 Subjective:    Patient ID: Melody [name], female    DOB: 1956-01-25, 68 y.o.   MRN: [id number]      HPI Wendy is here for No chief complaint on file.   Pain is in the mid abdomen just below the umbilicus and radiates to the right.  Pain is intermittent.  Has diarrhea.  GB removed Nov 2024  W/p hysterectomy   Epigastric pain - thought to be related to gerd - on protonix 40 mg bid.   CT Ab/P  01/03/24:  reviewed.  Referrals ordered for pulm, gyn.     Medications and allergies reviewed with patient and updated if appropriate.  Current Outpatient Medications on File Prior to Visit  Medication Sig Dispense Refill   estradiol (ESTRACE) 0.1 MG/GM vaginal cream 1 gram at bedtime 30 g 12   albuterol (VENTOLIN HFA) 108 (90 Base) MCG/ACT inhaler INHALE TWO PUFFS BY MOUTH EVERY 4 HOURS AS NEEDED FOR wheezing OR SHORTNESS OF BREATH (Patient taking differently: Inhale 2 puffs into the lungs every 4 (four) hours as needed for shortness of breath or wheezing.) 8.5 g 5   ALPRAZolam (XANAX) 1 MG tablet Take 1 mg by mouth 3 (three) times daily.     Cholecalciferol (VITAMIN D3) 1.25 MG (50000 UT) CAPS TAKE ONE CAPSULE BY MOUTH EVERY 14 DAYS ON TUESDAYS (Patient taking differently: Take 1 capsule by mouth every 14 (fourteen) days.) 6 capsule 3   cyanocobalamin (VITAMIN B12) 1000 MCG/ML injection INJECT INTO THE SKIN EVERY 14 DAYS (Patient taking differently: Inject 1,000 mcg into the muscle every 14 (fourteen) days.) 6 mL 3   diclofenac Sodium (VOLTAREN) 1 % GEL APPLY TWO GRAMS TO AFFECTED AREA(S) FOUR TIMES DAILY (Patient taking differently: Apply 2 g topically 4 (four) times daily as needed (shoulder pain).) 200 g 3   fluticasone (FLONASE) 50 MCG/ACT nasal spray INSTILL TWO SPRAYS IN EACH NOSTRIL DAILY 48 g 3   guaiFENesin-dextromethorphan (ROBITUSSIN DM) 100-10 MG/5ML syrup Take 5 mLs by mouth every 4 (four) hours as needed for cough. (Patient not taking: Reported on 01/16/2024) 118 mL 0    levocetirizine (XYZAL) 5 MG tablet TAKE 1 TABLET BY MOUTH EVERY EVENING (FOR ALLERGY) (Patient taking differently: Take 5 mg by mouth daily as needed for allergies.) 90 tablet 3   levothyroxine (SYNTHROID) 50 MCG tablet TAKE 1 TABLET BY MOUTH EVERY MORNING 90 tablet 3   losartan (COZAAR) 25 MG tablet TAKE 1 TABLET BY MOUTH DAILY 30 tablet 5   magnesium oxide (MAG-OX) 400 (240 Mg) MG tablet TAKE 1 TABLET BY MOUTH ONCE daily AT NOON (Patient taking differently: Take 400 mg by mouth daily.) 30 tablet 3   methylphenidate (RITALIN) 20 MG tablet Take 1 tablet (20 mg total) by mouth daily. (Patient taking differently: Take 10-20 mg by mouth daily.)  0   nitrofurantoin (MACRODANTIN) 50 MG capsule Take 1 capsule (50 mg total) by mouth at bedtime. 30 capsule 3   nystatin (MYCOSTATIN) 100000 UNIT/ML suspension Take 5 mLs (500,000 Units total) by mouth 4 (four) times daily. Swish hold and swallow. (Patient not taking: Reported on 01/16/2024) 60 mL 0   nystatin (MYCOSTATIN) 100000 UNIT/ML suspension Swish spit 3 times a day 60 mL 0   oxybutynin (DITROPAN-XL) 10 MG 24 hr tablet Take 1 tablet (10 mg total) by mouth at bedtime. Follow-up appt due in Nov must see provider for future refills 30 tablet 5   oxyCODONE-acetaminophen (PERCOCET/ROXICET) 5-325 MG tablet TAKE 1 TABLET BY MOUTH EVERY  8 HOURS AS NEEDED FOR SEVERE pain 60 tablet 0   pantoprazole (PROTONIX) 40 MG tablet TAKE 1 TABLET BY MOUTH TWICE DAILY 60 tablet 5   potassium chloride SA (KLOR-CON M) 20 MEQ tablet TAKE 1 TABLET BY MOUTH ONCE DAILY AT NOON (Patient taking differently: Take 20 mEq by mouth daily.) 30 tablet 3   QUEtiapine (SEROQUEL) 25 MG tablet Take 25 mg by mouth at bedtime.     SPIRIVA RESPIMAT 2.5 MCG/ACT AERS INHALE TWO PUFFS BY MOUTH EVERY DAY (Patient taking differently: Inhale 2 puffs into the lungs daily.) 4 g 11   No current facility-administered medications on file prior to visit.    Review of Systems     Objective:  There were no  vitals filed for this visit. BP Readings from Last 3 Encounters:  01/16/24 (!) 150/90  12/24/23 120/78  12/20/23 138/80   Wt Readings from Last 3 Encounters:  01/16/24 125 lb (56.7 kg)  12/24/23 127 lb (57.6 kg)  12/20/23 127 lb (57.6 kg)   There is no height or weight on file to calculate BMI.    Physical Exam         Assessment & Plan:    See Problem List for Assessment and Plan of chronic medical problems.

## 2024-02-05 ENCOUNTER — Ambulatory Visit (INDEPENDENT_AMBULATORY_CARE_PROVIDER_SITE_OTHER): Admitting: Internal Medicine

## 2024-02-05 VITALS — BP 132/70 | HR 65 | Temp 97.9°F | Ht 66.0 in | Wt 123.0 lb

## 2024-02-05 DIAGNOSIS — R634 Abnormal weight loss: Secondary | ICD-10-CM | POA: Diagnosis not present

## 2024-02-05 DIAGNOSIS — R1084 Generalized abdominal pain: Secondary | ICD-10-CM | POA: Diagnosis not present

## 2024-02-05 DIAGNOSIS — K219 Gastro-esophageal reflux disease without esophagitis: Secondary | ICD-10-CM | POA: Diagnosis not present

## 2024-02-05 MED ORDER — DICYCLOMINE HCL 20 MG PO TABS: 20.0000 mg | ORAL_TABLET | Freq: Three times a day (TID) | ORAL | 0 refills | Status: DC

## 2024-02-05 NOTE — Patient Instructions (Addendum)
       Medications changes include :   Dicyclomine 20 mg before you eat for the intestinal cramping    A referral was ordered gastroenterology and someone will call you to schedule an appointment.

## 2024-02-10 ENCOUNTER — Telehealth (INDEPENDENT_AMBULATORY_CARE_PROVIDER_SITE_OTHER): Payer: Self-pay | Admitting: *Deleted

## 2024-02-10 ENCOUNTER — Telehealth: Payer: Self-pay | Admitting: Internal Medicine

## 2024-02-10 ENCOUNTER — Ambulatory Visit: Admitting: Internal Medicine

## 2024-02-10 ENCOUNTER — Ambulatory Visit: Payer: Self-pay

## 2024-02-10 ENCOUNTER — Ambulatory Visit: Admitting: Obstetrics & Gynecology

## 2024-02-10 NOTE — Telephone Encounter (Signed)
 Pt called and states she has been on 3 antibiotics for UTI and her bladder has fell and needs it tacked up. I advised her to call her pcp or gyn for those issues since we are GI. She said she was having some abdominal pain when she has BM every since her gallbladder was taken out last November. I offered to make her an appt with GI for the abdominal pain with BM. She declined at this time and was going to see pcp tomorrow and then call us  back if she wanted an appointment.

## 2024-02-10 NOTE — Telephone Encounter (Signed)
 Copied from CRM [id number]. Topic: Clinical - Red Word Triage >> Feb 10, 2024  1:52 PM [place] J wrote: [facility] that prompted transfer to Nurse Triage: Patient is having extreme abdominal pain. Patient stated that using the bathroom feel like she's in labor.   Chief Complaint: Abdominal pain "ever since they took my gallbladder out." Has pain with urination as well. Symptoms: Above Frequency: Has appointment tomorrow. Pertinent Negatives: Patient denies  Disposition: [] ED /[] Urgent Care (no appt availability in office) / [] Appointment(In office/virtual)/ []  [place] [facility]/ [] Home Care/ [] Refused Recommended Disposition /[] [place] [facility]/ []  Follow-up with PCP Additional Notes: Pt. Will keep appointment.   Reason for Disposition  [1] MODERATE pain (e.g., interferes with normal activities) AND [2] pain comes and goes (cramps) AND [3] present > 24 hours  (Exception: Pain with Vomiting or Diarrhea - see that Guideline.)  Answer Assessment - Initial Assessment Questions 1. LOCATION: "Where does it hurt?"      Lower 2. RADIATION: "Does the pain shoot anywhere else?" (e.g., chest, back)     No 3. ONSET: "When did the pain begin?" (e.g., minutes, hours or days ago)      Unsure 4. SUDDEN: "Gradual or sudden onset?"     Gradual 5. PATTERN "Does the pain come and go, or is it constant?"    - If it comes and goes: "How long does it last?" "Do you have pain now?"     (Note: Comes and goes means the pain is intermittent. It goes away completely between bouts.)    - If constant: "Is it getting better, staying the same, or getting worse?"      (Note: Constant means the pain never goes away completely; most serious pain is constant and gets worse.)      Comes and goes 6. SEVERITY: "How bad is the pain?"  (e.g., Scale 1-10; mild, moderate, or severe)    - MILD (1-3): Doesn't interfere with normal activities, abdomen soft and not tender to touch.     - MODERATE (4-7): Interferes with normal  activities or awakens from sleep, abdomen tender to touch.     - SEVERE (8-10): Excruciating pain, doubled over, unable to do any normal activities.       Severe 7. RECURRENT SYMPTOM: "Have you ever had this type of stomach pain before?" If Yes, ask: "When was the last time?" and "What happened that time?"      yes 8. CAUSE: "What do you think is causing the stomach pain?"     Gallbladder removal 9. RELIEVING/AGGRAVATING FACTORS: "What makes it better or worse?" (e.g., antacids, bending or twisting motion, bowel movement)     No 10. OTHER SYMPTOMS: "Do you have any other symptoms?" (e.g., back pain, diarrhea, fever, urination pain, vomiting)       No 11. PREGNANCY: "Is there any chance you are pregnant?" "When was your last menstrual period?"       No  Protocols used: Abdominal Pain - [facility]

## 2024-02-10 NOTE — Telephone Encounter (Signed)
 Copied from CRM [id number]. Topic: Referral - Status >> Feb 10, 2024 10:10 AM Chuck Crater wrote: Reason for CRM: Patient states that gastro office hasn't called her to schedule an appointment. She states that she would like to go to a [place] office near her if possible.

## 2024-02-10 NOTE — Telephone Encounter (Addendum)
 Chief Complaint: vaginal itching Symptoms: chronic abdominal pain, urinary hesitancy Frequency: months Pertinent Negatives: Patient denies fever, SOB, CP Disposition: [] ED /[] Urgent Care (no appt availability in office) / [x] Appointment(In office/virtual)/ []  [place] [facility]/ [] Home Care/ [] Refused Recommended Disposition /[] [place] [facility]/ []  Follow-up with PCP Additional Notes: Pt initially c/o vaginal itching s/p topical treatment that she has now stopped x 1 week with continued itching. Pt also endorses chronic abdominal pain and urinary hesitancy. Pt reports has seen multiple HCP including specialists with no relief. Call was extensive d/t pt's stream of consciousness after every triage question, thus limiting triage as pt continued to express frustration with PCP (and lack of access) and seeing other HCP. Triager was finally able to schedule acute appt 02/11/2024. Patient verbalized understanding and to call back with worsening symptoms.    Copied from CRM [id number]. Topic: Clinical - Red Word Triage >> Feb 10, 2024 10:15 AM Denese Killings wrote: Red Word that prompted transfer to Nurse Triage: Patient is taking an antibiotic dicyclomine (BENTYL) 20 MG tablet and it is not working(not agreeing with her). She states that she is severe itching in her private area. She cannot stand it. Reason for Disposition  MODERATE-SEVERE itching (i.e., interferes with school, work, or sleep)  [1] Caller mainly has complaints about past medical care, billing, etc. AND [2] has mild symptoms or is well  Answer Assessment - Initial Assessment Questions 1. NAME of MEDICINE: "What medicine(s) are you calling about?"     dicyclomine (BENTYL) 20 MG tablet Pt reports taking for UTI? - reports has not been working estradiol (ESTRACE) 0.1 MG/GM vaginal cream - stopped using 2. QUESTION: "What is your question?" (e.g., double dose of medicine, side effect)     Not working 3. PRESCRIBER: "Who prescribed  the medicine?" Reason: if prescribed by specialist, call should be referred to that group.     Pincus Sanes, MD - Annye Asa, MD - cream 4. SYMPTOMS: "Do you have any symptoms?" If Yes, ask: "What symptoms are you having?"  "How bad are the symptoms (e.g., mild, moderate, severe)     Abdominal pain, vaginal itching, urinary hesitancy  Answer Assessment - Initial Assessment Questions 1. SYMPTOM: "What's the main symptom you're concerned about?" (e.g., frequency, incontinence)     Vaginal Itching Urinary hesitancy 2. ONSET: "When did the  urinary hesitancy  start?"     A few months 3. PAIN: "Is there any pain?" If Yes, ask: "How bad is it?" (Scale: 1-10; mild, moderate, severe)     Abdominal pain 4. CAUSE: "What do you think is causing the symptoms?"     UTI 5. OTHER SYMPTOMS: "Do you have any other symptoms?" (e.g., blood in urine, fever, flank pain, pain with urination)     Previously saw blood in urine  Answer Assessment - Initial Assessment Questions 1. SYMPTOM: "What's the main symptom you're concerned about?" (e.g., rash, itching, swelling, dryness)     itching 2. LOCATION: "Where is the  itching located?" (e.g., inside/outside, left/right)     Inside and outside 3. ONSET: "When did the  abd pain  start?"     months 4. PAIN: "Is there any pain?" If Yes, ask: "How bad is it?" (Scale: 1-10; mild, moderate, severe)   -  MILD (1-3): Doesn't interfere with normal activities.    -  MODERATE (4-7): Interferes with normal activities (e.g., work or school) or awakens from sleep.     -  SEVERE (8-10): Excruciating pain, unable  to do any normal activities.     moderate 5. CAUSE: "What do you think is causing the symptoms?"     I dont know 6. OTHER SYMPTOMS: "Do you have any other symptoms?" (e.g., fever, vaginal bleeding, pain with urination)     denies  Answer Assessment - Initial Assessment Questions 1. SITUATION:  Document reason for call.     Pt calling with acute sx  of vaginal itching s/p topical cream that she has stopped x 1 week. Pt also endorses chronic abdominal pain with failed tx from multiple providers, including specialties. Pt expresses frustration with not being able to see PCP and triager does appreciate a flight of consciousness during extended call. Pt is undecisive on whether she wanted to continue with acute appt to address sx. Triager attempted to best explain that disposition for current sx is to be evaluated by HCP. Triager even attempted to schedule virtual visit that elicited poor reaction from pt, stating that "mychart has blown up my phone" and that [facility] should be responsible for getting her a new phone. Triager cancelled virtual appt after realizing that pt may not be able to access the appt appropriately. After a prolonged conversation of triager not knowing what pt expectation of call was and trying to schedule acute visit with HCP, pt finally stated "OK, I guess." 2. BACKGROUND: Document any background information (e.g., prior calls, known psychiatric history)     Has had acute visits with other HCP, as well as specialist. Pending GI referral. 3. ASSESSMENT: Document your nursing assessment.     Pt seems to be unhappy with care and expressed frustration of being overlooked, as sx are still persistent and there has been no relief and previous tx options have not been successful. Triage was limited due to multiple streams of consciousness with every questions asked, that lead to concerns that triager could not address at the time, including why PCP would not see her and if he did not want to continue having her as a pt. Triager finally asked pt how she can best help pt, and pt stated "I don't know" 4. RESPONSE: Document what your response or recommendation was.     Triager attempted to focus on acute issue at hand and tried to best explain that the acute sx necessitated an evaluation by HCP. Triager scheduled appt with alternate HCP.  Protocols  used: Medication Question Call-A-AH, Urinary Symptoms-A-AH, [place] Symptoms-A-AH, Difficult Call-A-AH

## 2024-02-11 ENCOUNTER — Encounter: Payer: Self-pay | Admitting: Family Medicine

## 2024-02-11 ENCOUNTER — Ambulatory Visit (INDEPENDENT_AMBULATORY_CARE_PROVIDER_SITE_OTHER): Admitting: Family Medicine

## 2024-02-11 VITALS — BP 162/90 | HR 50 | Temp 98.4°F | Ht 66.0 in | Wt 125.0 lb

## 2024-02-11 DIAGNOSIS — R109 Unspecified abdominal pain: Secondary | ICD-10-CM

## 2024-02-11 DIAGNOSIS — R350 Frequency of micturition: Secondary | ICD-10-CM

## 2024-02-11 DIAGNOSIS — R197 Diarrhea, unspecified: Secondary | ICD-10-CM | POA: Diagnosis not present

## 2024-02-11 DIAGNOSIS — N898 Other specified noninflammatory disorders of vagina: Secondary | ICD-10-CM | POA: Diagnosis not present

## 2024-02-11 DIAGNOSIS — G8929 Other chronic pain: Secondary | ICD-10-CM | POA: Diagnosis not present

## 2024-02-11 LAB — POC URINALSYSI DIPSTICK (AUTOMATED)
Bilirubin, UA: NEGATIVE
Glucose, UA: NEGATIVE
Ketones, UA: NEGATIVE
Nitrite, UA: NEGATIVE
Protein, UA: NEGATIVE
Spec Grav, UA: 1.02 (ref 1.010–1.025)
Urobilinogen, UA: 0.2 U/dL
pH, UA: 5 (ref 5.0–8.0)

## 2024-02-11 MED ORDER — CLOTRIMAZOLE-BETAMETHASONE 1-0.05 % EX CREA: 1.0000 | TOPICAL_CREAM | Freq: Every day | CUTANEOUS | 0 refills | Status: AC

## 2024-02-11 NOTE — Progress Notes (Signed)
 Acute Office Visit  Subjective:     Patient ID: Melody [name], female    DOB: 1956-02-16, 68 y.o.   MRN: [id number]  Chief Complaint  Patient presents with   Abdominal Pain    Chronic abdominal pain post gallbladder removal. Currently treating pain with oxycodone 5 mg   Vaginal Itching    Vaginal itching due to ongoing UTI. Patient has had bouts of UTIs and UTI like symptoms since November of last year    HPI Patient is in today for chronic vaginal itching, continued pelvic pain, intermittent diarrhea post cholecystectomy. She has seen Dr Despina Hidden with [facility].  She has been referred to [facility]. She has not had an appt with them yet. States that she has a hard time using her phone and keeping up with appointments. Reports vaginal itching that has continued after seeing GYN. Reports compliance with medication regimen.  Abdominal pain is right sided, worse after dinner. Sometimes relieved with BM.  Intermittent diarrhea that she can not control. Denies known sick contacts. Denies other concerns.   ROS Per HPI      Objective:    BP (!) 162/90   Pulse (!) 50   Temp 98.4 F (36.9 C)   Ht 5\' 6"  (1.676 m)   Wt 125 lb (56.7 kg)   LMP 10/29/2006 (Approximate)   SpO2 98%   BMI 20.18 kg/m    Physical Exam Vitals and nursing note reviewed.  Constitutional:      General: She is not in acute distress.    Appearance: Normal appearance. She is normal weight.  HENT:     Head: Normocephalic and atraumatic.     Right Ear: External ear normal.     Left Ear: External ear normal.     Nose: Nose normal.     Mouth/Throat:     Mouth: Mucous membranes are moist.     Pharynx: Oropharynx is clear.  Eyes:     Extraocular Movements: Extraocular movements intact.     Pupils: Pupils are equal, round, and reactive to Milius.  Cardiovascular:     Rate and Rhythm: Normal rate and regular rhythm.     Pulses: Normal pulses.     Heart sounds: Normal heart sounds.   Pulmonary:     Effort: Pulmonary effort is normal. No respiratory distress.     Breath sounds: Normal breath sounds. No wheezing, rhonchi or rales.  Abdominal:     General: Abdomen is flat.     Palpations: Abdomen is soft. There is no hepatomegaly or splenomegaly.     Tenderness: There is generalized abdominal tenderness (Right abd more tender, but diffusely tender) and tenderness in the right upper quadrant and right lower quadrant. There is guarding. There is no right CVA tenderness, left CVA tenderness or rebound. Negative signs include Murphy's sign, Rovsing's sign, McBurney's sign, psoas sign and obturator sign.  Musculoskeletal:        General: Normal range of motion.     Cervical back: Normal range of motion.     Right lower leg: No edema.     Left lower leg: No edema.  Lymphadenopathy:     Cervical: No cervical adenopathy.  Neurological:     General: No focal deficit present.     Mental Status: She is alert and oriented to person, place, and time.  Psychiatric:        Mood and Affect: Mood is anxious.        Thought Content:  Thought content normal.     Results for orders placed or performed in visit on 02/11/24  POCT Urinalysis Dipstick (Automated)  Result Value Ref Range   Color, UA     Clarity, UA     Glucose, UA Negative Negative   Bilirubin, UA negative    Ketones, UA negative    Spec Grav, UA 1.020 1.010 - 1.025   Blood, UA 1+    pH, UA 5.0 5.0 - 8.0   Protein, UA Negative Negative   Urobilinogen, UA 0.2 0.2 or 1.0 E.U./dL   Nitrite, UA negative    Leukocytes, UA Trace (A) Negative        Assessment & Plan:   Diarrhea, unspecified type -     GI Profile, Stool, PCR -     H. pylori antigen, stool  Chronic abdominal pain -     GI Profile, Stool, PCR -     H. pylori antigen, stool  Vaginal itching -     Clotrimazole-Betamethasone; Apply 1 Application topically daily.  Dispense: 30 g; Refill: 0  Urinary frequency -     POCT Urinalysis Dipstick  (Automated) -     Urine Culture; Future     Meds ordered this encounter  Medications   clotrimazole-betamethasone (LOTRISONE) cream    Sig: Apply 1 Application topically daily.    Dispense:  30 g    Refill:  0    Return for as scheduled with PCP.  Wellington Half, FNP

## 2024-02-11 NOTE — Patient Instructions (Addendum)
 Set up appt with GI. We called them and it went straight to voicemail.   Recommend that you call to schedule at:   [facility] at [facility] [address] [place],  [place]  [address]  Main: [phone]  I have ordered stool studies for you to check for intestinal infection today.  The lab downstairs will have instructions for this.   We will call you with any results that require further treatment.   Follow-up with me for new or worsening symptoms.

## 2024-02-12 ENCOUNTER — Telehealth: Payer: Self-pay | Admitting: Gastroenterology

## 2024-02-12 ENCOUNTER — Ambulatory Visit: Payer: Self-pay

## 2024-02-12 LAB — URINE CULTURE: Result:: NO GROWTH

## 2024-02-12 NOTE — Telephone Encounter (Signed)
 LMOM for patient to call [address] office to schedule OV with [place] in [place]

## 2024-02-12 NOTE — Telephone Encounter (Signed)
  Chief Complaint: pain Symptoms: dysuria, vaginal itching, abdominal pain, fever Frequency: constant Pertinent Negatives: Patient denies new symptoms Disposition: [] ED /[] Urgent Care (no appt availability in office) / [x] Appointment(In office/virtual)/ []  [place] [facility]/ [] Home Care/ [] Refused Recommended Disposition /[] [place] [facility]/ []  Follow-up with PCP Additional Notes:  Patient requesting an antibiotic for "UTI". She was evaluated by Casimer Clear yesterday for diarrhea, referred to gastro, states she provided urine sample for urinary symptoms but was not prescribed antibiotic, she was prescribed cream for vaginal itching. She states the cream caused her to "blow up" and now itching is intense, she is wanting to take a brush and itch it all away. She reports her abdominal pain is causing her to "Double over". She woke up today with fever blister on her lip, she states this occurs when she gets a UTI or other illness. She states she does have a fever but has not measured temperature, states she had a fever during evaluation yesterday. Follow up appointment not scheduled at this time, she would like Dr. Georgia Kipper to send in antibiotic as he has in the past for recurring UTI to [facility]. Patient requests call back from office staff today re: antibiotic request. Best return number [phone]   Copied from CRM [id number]. Topic: Clinical - Red Word Triage >> Feb 12, 2024  1:35 PM Kita Perish H wrote: [facility] that prompted transfer to Nurse Triage: Patient states she wanted to die because she was in pain. Reason for Disposition  All other urine symptoms  Protocols used: Urinary Symptoms-A-AH

## 2024-02-12 NOTE — Telephone Encounter (Signed)
 Spoke with patient, she is scheduled with Gastro tomorrow. She will need to F/U with GYN for her vaginal issues, Trevor Fudge states patient does not have a UTI does not need antibiotics

## 2024-02-12 NOTE — Telephone Encounter (Signed)
 REceived a message from a CMA from drs office to get in touch with this patient to schedule an appt.  Last saw [facility].  [phone]

## 2024-02-13 ENCOUNTER — Ambulatory Visit (INDEPENDENT_AMBULATORY_CARE_PROVIDER_SITE_OTHER): Admitting: Gastroenterology

## 2024-02-13 ENCOUNTER — Encounter (INDEPENDENT_AMBULATORY_CARE_PROVIDER_SITE_OTHER): Payer: Self-pay | Admitting: Gastroenterology

## 2024-02-13 ENCOUNTER — Telehealth (INDEPENDENT_AMBULATORY_CARE_PROVIDER_SITE_OTHER): Payer: Self-pay | Admitting: Gastroenterology

## 2024-02-13 VITALS — BP 110/63 | HR 71 | Temp 97.1°F | Ht 66.0 in | Wt 125.5 lb

## 2024-02-13 DIAGNOSIS — R109 Unspecified abdominal pain: Secondary | ICD-10-CM

## 2024-02-13 DIAGNOSIS — R1030 Lower abdominal pain, unspecified: Secondary | ICD-10-CM

## 2024-02-13 DIAGNOSIS — R634 Abnormal weight loss: Secondary | ICD-10-CM

## 2024-02-13 NOTE — Telephone Encounter (Signed)
 Noted, Thanks

## 2024-02-13 NOTE — Progress Notes (Signed)
 Referring Provider: Genia Kettering, MD Primary Care Physician:  Genia Kettering, MD Primary GI Physician: Dr. Sammi Crick   Chief Complaint  Patient presents with   Weight Loss    Patient here today due to having weight loss. She says she has severe abdominal pain at times, and fecal urgency. She has really soft stools. She says she has been having issues with her bladder.   HPI:   Melody [name] is a 68 y.o. female with past medical history of anxiety, depression, fibromyalgia, GERD, hypothyroidism, respiratory MAI   Patient presenting today for:  Weight loss and abdominal pain   Last seen feb 2024, at that time not feeling well. Having diarrhea intermittently. No abdominal pain. Having heartburn and dysphagia. GERD symptoms a few times per week on protonix  40mg  BID  Recommended to stop protonix , start omeprazole  40mg  BID, consider TIF, low FODMAP, benefiber [facility] resources provided, good stress management   Present: TSH 07/2023 2.4, GI pathogen panel and h pylori testing ordered but not completed, BMP in November grossly unremarkable  Patient states she is here for UTI and abdominal pain that is severe. She states she cannot get in to see her PCP. She had UA on 4/15 that was only +for leukocytes. She notes that she has had issues with urinating where she will have to sit for long periods of time to urinate though this has improved. Per chart review she is actively seeing PCP regarding this.  She states that stools are more formed, usually 3-4 on bristol stool scale. she actually has not had a BM in a few days. Diarrhea has improved over the past few months since she has gotten farther out from her cholecystectomy.   She had GB removed in November. She is having periumbilical abdominal pain, almost everytime she eats that will "double her over" in pain with a cramping sensation. She notes this has been occurring since she had her GB removed. Pain sometimes improves after  defecation but sometime she has to return to the restroom to go again. No rectal bleeding or melena. She notes appetite is not good. She is not hungry. Has lost some weight  she weighed 147 lbs 10/2022, she is 125lbs today. Is unable to pinpoint how many BMs per day as some days she does not go at all. She is taking dicyclomine  20mg , she takes this everyday but unsure how many times, not sure if this is helping at all. States she was told this was an antibiotic.   No nausea or vomiting. She is taking protonix  40mg  BID, reports GERD symptoms depend on what she eats but not occurring frequently. Denies any upper abdominal/epigastric pain today. No dysphagia.    CT A/P without contrast: 12/2023 Nonobstructive left renal calculus. No definite ureteral calculus obstructive uropathy. Evaluation of the mid to distal ureters is limited due to overlapping structures and vascular calcifications. 2. Pelvic floor laxity with small cystocele. 3. Bronchiectasis with multifocal mucous impaction. Scattered pulmonary nodules are noted at the lung bases measuring up to 1.6 cm in the left lower lobe. While findings may be related to infectious or inflammatory process, the possibility of underlying malignancy can not be excluded. 4. Aortic atherosclerosis. Last Colonoscopy:09/2022 - One 2 mm polyp in the transverse colon (lymph node)                           - Diverticulosis in the sigmoid colon, in the  descending colon, in the transverse colon and in                            the ascending colon. normal colon biopsied-normal                           - Non-bleeding internal hemorrhoids.   Last Endoscopy: 09/2022 - No endoscopic esophageal abnormality to explain                            patient's dysphagia. Esophagus dilated. Dilated.                           - 2 cm hiatal hernia.                           - Normal stomach. Biopsied-normal                            - Normal  examined duodenum. Biopsied-normal    Recommendations:  Repeat TCS 10 years  Filed Weights   02/13/24 0917  Weight: 125 lb 8 oz (56.9 kg)     Past Medical History:  Diagnosis Date   Anxiety    Blood dyscrasia    thrombocytopenia   Bronchiectasis (HCC)    Chronic back pain    Chronic neck pain    COPD (chronic obstructive pulmonary disease) (HCC)    Depression    bipolar- Dr [name]   Fibromyalgia    GERD (gastroesophageal reflux disease)    Headache    Migraine,   History of hiatal hernia    Hypertension    Hypothyroidism    Low back pain    Dr Larrie Po   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[facility])    Osteoarthritis    Pneumonia    Pulmonary nodule    Vertigo    Vitamin B12 deficiency     Past Surgical History:  Procedure Laterality Date   ABDOMINAL HYSTERECTOMY N/A 01/10/2016   Procedure: HYSTERECTOMY ABDOMINAL;  Surgeon: Greta Leatherwood, MD;  Location: [facility];  Service: Gynecology;  Laterality: N/A;   ABDOMINAL SACROCOLPOPEXY N/A 01/10/2016   Procedure: ABDOMINO SACROCOLPOPEXY ;  Surgeon: Greta Leatherwood, MD;  Location: [facility];  Service: Gynecology;  Laterality: N/A;   ANTERIOR AND POSTERIOR REPAIR N/A 01/10/2016   Procedure:  POSTERIOR REPAIR (RECTOCELE);  Surgeon: Greta Leatherwood, MD;  Location: [facility];  Service: Gynecology;  Laterality: N/A;   BIOPSY  10/19/2022   Procedure: BIOPSY;  Surgeon: Urban Garden, MD;  [facility];  Service: Gastroenterology;;   BLADDER SUSPENSION N/A 01/10/2016   Procedure: TRANSVAGINAL TAPE (TVT) PROCEDURE exact midurethral sling;  Surgeon: Greta Leatherwood, MD;  Location: [facility];  Service: Gynecology;  Laterality: N/A;   CERVICAL LAMINECTOMY  2001 and 2005   Dr Larrie Po    ACDF  C5-6, C6-7   CHOLECYSTECTOMY N/A 09/23/2023   Procedure: LAPAROSCOPIC CHOLECYSTECTOMY;  Surgeon: Adalberto Acton, MD;  Location: [facility];  Service: General;  Laterality: N/A;   COLONOSCOPY WITH  PROPOFOL  N/A 10/19/2022   Procedure: COLONOSCOPY WITH PROPOFOL ;  Surgeon: Urban Garden, MD;  [facility]: [facility];  Service: Gastroenterology;  Laterality: N/A;  10:30am,  asa 1-2   CYSTO N/A 01/10/2016   Procedure: CYSTOSCOPY;  Surgeon: Greta Leatherwood, MD;  Location: [facility];  Service: Gynecology;  Laterality: N/A;   ESOPHAGEAL DILATION N/A 10/19/2022   Procedure: ESOPHAGEAL DILATION;  Surgeon: Urban Garden, MD;  [facility];  Service: Gastroenterology;  Laterality: N/A;   ESOPHAGOGASTRODUODENOSCOPY (EGD) WITH PROPOFOL  N/A 10/19/2022   Procedure: ESOPHAGOGASTRODUODENOSCOPY (EGD) WITH PROPOFOL ;  Surgeon: Urban Garden, MD;  [facility];  Service: Gastroenterology;  Laterality: N/A;   POLYPECTOMY  10/19/2022   Procedure: POLYPECTOMY INTESTINAL;  Surgeon: Urban Garden, MD;  [facility];  Service: Gastroenterology;;   SALPINGOOPHORECTOMY Bilateral 01/10/2016   Procedure: BILATERAL SALPINGO OOPHORECTOMY;  Surgeon: Greta Leatherwood, MD;  Location: [facility];  Service: Gynecology;  Laterality: Bilateral;   SAVORY DILATION  10/19/2022   Procedure: SAVORY DILATION;  Surgeon: Umberto Ganong, Bearl Limes, MD;  [facility];  Service: Gastroenterology;;   TUBAL LIGATION  10/29/1978    Current Outpatient Medications  Medication Sig Dispense Refill   albuterol  (VENTOLIN  HFA) 108 (90 Base) MCG/ACT inhaler INHALE TWO PUFFS BY MOUTH EVERY 4 HOURS AS NEEDED FOR wheezing OR SHORTNESS OF BREATH (Patient taking differently: Inhale 2 puffs into the lungs every 4 (four) hours as needed for shortness of breath or wheezing.) 8.5 g 5   ALPRAZolam  (XANAX ) 1 MG tablet Take 1 mg by mouth 3 (three) times daily.     Cholecalciferol (VITAMIN D3) 1.25 MG (50000 UT) CAPS TAKE ONE CAPSULE BY MOUTH EVERY 14 DAYS ON TUESDAYS (Patient taking differently: Take 1 capsule by mouth every 14 (fourteen) days.) 6 capsule 3    clotrimazole -betamethasone  (LOTRISONE ) cream Apply 1 Application topically daily. 30 g 0   cyanocobalamin  (VITAMIN B12) 1000 MCG/ML injection INJECT 1ML INTO THE SKIN EVERY 14 DAYS (Patient taking differently: Inject 1,000 mcg into the muscle every 14 (fourteen) days.) 6 mL 3   diclofenac  Sodium (VOLTAREN ) 1 % GEL APPLY TWO GRAMS TO AFFECTED AREA(S) FOUR TIMES DAILY (Patient taking differently: Apply 2 g topically 4 (four) times daily as needed (shoulder pain).) 200 g 3   dicyclomine  (BENTYL ) 20 MG tablet Take 1 tablet (20 mg total) by mouth 3 (three) times daily before meals. 90 tablet 0   estradiol  (ESTRACE ) 0.1 MG/GM vaginal cream 1 gram at bedtime 30 g 12   fluticasone  (FLONASE ) 50 MCG/ACT nasal spray INSTILL TWO SPRAYS IN EACH NOSTRIL DAILY 48 g 3   guaiFENesin -dextromethorphan  (ROBITUSSIN DM) 100-10 MG/5ML syrup Take 5 mLs by mouth every 4 (four) hours as needed for cough. 118 mL 0   levocetirizine (XYZAL ) 5 MG tablet TAKE 1 TABLET BY MOUTH EVERY EVENING (FOR ALLERGY ) (Patient taking differently: Take 5 mg by mouth daily as needed for allergies.) 90 tablet 3   levothyroxine  (SYNTHROID ) 50 MCG tablet TAKE 1 TABLET BY MOUTH EVERY MORNING 90 tablet 3   losartan  (COZAAR ) 25 MG tablet TAKE 1 TABLET BY MOUTH DAILY 30 tablet 5   methylphenidate  (RITALIN ) 20 MG tablet Take 1 tablet (20 mg total) by mouth daily. (Patient taking differently: Take 10-20 mg by mouth daily.)  0   nitrofurantoin  (MACRODANTIN ) 50 MG capsule Take 1 capsule (50 mg total) by mouth at bedtime. 30 capsule 3   oxybutynin  (DITROPAN -XL) 10 MG 24 hr tablet Take 1 tablet (10 mg total) by mouth at bedtime. Follow-up appt due in Nov must see provider for future refills 30 tablet 5   oxyCODONE -acetaminophen  (PERCOCET/ROXICET) 5-325 MG  tablet TAKE 1 TABLET BY MOUTH EVERY 8 HOURS AS NEEDED FOR SEVERE pain 60 tablet 0   pantoprazole  (PROTONIX ) 40 MG tablet TAKE 1 TABLET BY MOUTH TWICE DAILY 60 tablet 5   QUEtiapine  (SEROQUEL ) 25 MG tablet  Take 25 mg by mouth at bedtime.     SPIRIVA  RESPIMAT 2.5 MCG/ACT AERS INHALE TWO PUFFS BY MOUTH EVERY DAY (Patient taking differently: Inhale 2 puffs into the lungs daily.) 4 g 11   No current facility-administered medications for this visit.    Allergies as of 02/13/2024 - Review Complete 02/13/2024  Allergen Reaction Noted   Flagyl  [metronidazole ] Shortness Of Breath 08/06/2018   Doxycycline hyclate Itching    Dulera  [mometasone  furo-formoterol fum]  08/29/2022   Gabapentin   11/12/2017   Nabumetone Other (See Comments) 09/01/2010   Sulfa antibiotics Other (See Comments) 11/08/2013   Tetracycline hcl Itching    Tramadol  hcl Other (See Comments) 03/18/2023    Social History   Socioeconomic History   Marital status: Legally Separated    Spouse name: Not on file   Number of children: Not on file   Years of education: Not on file   Highest education level: Not on file  Occupational History   Not on file  Tobacco Use   Smoking status: Former    Current packs/day: 0.00    Average packs/day: 1 pack/day for 48.5 years (48.5 ttl pk-yrs)    Types: Cigarettes    Start date: 11/21/1962    Quit date: 05/06/2011    Years since quitting: 12.7   Smokeless tobacco: Never   Tobacco comments:    stopped periodically - peak rate of 1 ppd  Vaping Use   Vaping status: Never Used  Substance and Sexual Activity   Alcohol use: No    Alcohol/week: 0.0 standard drinks of alcohol   Drug use: No   Sexual activity: Not Currently    Birth control/protection: Post-menopausal, Surgical    Comment: Tubal/Hyst/BSO  Other Topics Concern   Not on file  Social History Narrative   [facility] (04/04/17):   Originally from [place], [place]. 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 Recently had a persian cat that passed. No bird exposure. She denies any indoor plants currently. She does use wood burning for heat. She reports there is water  getting under  her foundation. She has mold in her home that she reports is in the 2nd floor of her home. She has recently started seeing mold under her bed. She reports her home smells musty & damp. She has always lived in [place].    Social Drivers of Health   Financial Resource Strain: High Risk (01/16/2024)   Overall Financial Resource Strain (CARDIA)    Difficulty of Paying Living Expenses: Hard  Food Insecurity: Food Insecurity Present (01/16/2024)   Hunger Vital Sign    Worried About Running Out of Food in the Last Year: Never true    Ran Out of Food in the Last Year: Sometimes true  Transportation Needs: No Transportation Needs (01/16/2024)   PRAPARE - Administrator, Civil Service (Medical): No    Lack of Transportation (Non-Medical): No  Physical Activity: Insufficiently Active (01/16/2024)   Exercise Vital Sign    Days of Exercise per Week: 1 day    Minutes of Exercise per Session: 10 min  Stress: Stress Concern Present (01/16/2024)   [employer] of Occupational Health - Occupational Stress Questionnaire  Feeling of Stress : Very much  Social Connections: Moderately Isolated (01/16/2024)   Social Connection and Isolation Panel [NHANES]    Frequency of Communication with Friends and Family: More than three times a week    Frequency of Social Gatherings with Friends and Family: Three times a week    Attends Religious Services: More than 4 times per year    Active Member of Clubs or Organizations: No    Attends Banker Meetings: Never    Marital Status: Separated    Review of systems General: negative for malaise, night sweats, fever, chills, +weight loss  Neck: Negative for lumps, goiter, pain and significant neck swelling Resp: Negative for cough, wheezing, dyspnea at rest CV: Negative for chest pain, leg swelling, palpitations, orthopnea GI: denies melena, hematochezia, nausea, vomiting, diarrhea, constipation, dysphagia, odyonophagia, early satiety   +postprandial abdominal pain +weight loss  MSK: Negative for joint pain or swelling, back pain, and muscle pain. Derm: Negative for itching or rash Psych: Denies depression, anxiety, memory loss, confusion. No homicidal or suicidal ideation.  The remainder of the review of systems is noncontributory.  Physical Exam: BP 110/63 (BP Location: Left Arm, Patient Position: Sitting, Cuff Size: Normal)   Pulse 71   Temp (!) 97.1 F (36.2 C) (Temporal)   Ht 5\' 6"  (1.676 m)   Wt 125 lb 8 oz (56.9 kg)   LMP 10/29/2006 (Approximate)   BMI 20.26 kg/m  General:   Alert and oriented. Agitated Head:  Normocephalic and atraumatic. Eyes:  Conjuctiva clear without scleral icterus. Mouth:  Oral mucosa pink and moist. Good dentition. No lesions. Heart: Normal rate and rhythm, s1 and s2 heart sounds present.  Lungs: Clear lung sounds in all lobes. Respirations equal and unlabored. Abdomen:  +BS, soft, non-tender and non-distended. No rebound or guarding. No HSM or masses noted. Neurologic:  Alert and  oriented x4 Psych:  Alert and cooperative. Pt appears very agitated, anxious, flight of ideas   Invalid input(s): "6 MONTHS"   ASSESSMENT: Melody [name] is a 68 y.o. female presenting today for abdominal pain and weight loss  Somewhat difficult to obtain history today as patient appears agitated, with flight of ideas, initially reporting she was here for UTI. Per chart review she had c/o diarrhea, though she tells me she has not had diarrhea for a while, this was occurring just after GB removal at the end of 2024. She endorses postprandial abdominal pain that is severe in nature "doubling her over" when it occurs, as well as weight loss. No rectal bleeding. She has history of IBS which is likely a contributing factor here, I suspect there may be a psych component as well, however, given postprandial pain and weight loss, cannot rule out chronic mesenteric ischemia. Would recommend continuing dicyclomine  20mg   TID before meals and obtaining CT angio A/P for further evaluation.    PLAN:  - continue dicyclomine  20mg  TID before meals -CT Angio A/P w wo contrast  All questions were answered, patient verbalized understanding and is in agreement with plan as outlined above.    Follow Up: 2 months   Jeannett Dekoning L. Adrien Alberta, MSN, APRN, AGNP-C Adult-Gerontology Nurse Practitioner [facility]  I have reviewed the note and agree with the APP's assessment as described in this progress note  Samantha Cress, MD Gastroenterology and Hepatology [facility]

## 2024-02-13 NOTE — Patient Instructions (Signed)
 We wil get you scheduled for a special CT of your abdomen to look for blood flow issues given your pain and weight loss In the meantime, please take dicyclomine 20mg  three times per day before meals or as needed for abdominal pain, this is an anti spasmodic medication  Follow up 2 months

## 2024-02-13 NOTE — Telephone Encounter (Signed)
 Pt was made OV for here late yesterday afternoon. She was very confused. She left message early this morning to confirm appointment was in [place] and not [place]. I called twice to tell her OV was here at 0915 not 0930. Both times all she would say was, Hello, hello, hello. I called a third time and it went to VM and I told her OV was at 0915 not 0930 at our office address. Just a FYI if she shows up late. Pt is very confused.

## 2024-02-16 NOTE — Progress Notes (Signed)
 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 Melody [name]  is a 68 y.o. female, with history of bronchiectasis, depression, GERD, hypothyroidism, MAI came to hospital with complaint of shortness of breath, cough.  Patient was recently discharged from the hospital on 10/02/2018.  Patient says that she has experienced worsening shortness of breath, coughing up phlegm.    In the ED chest x-ray showed diffusely increased interstitial opacity with mild nodularity on the right suspect for acute interstitial inflammatory infectious process superimposed on underlying chronic changes Patient started on IV Levaquin .   Brief Admission Hx: 68 y.o. female, with history of bronchiectasis, depression, GERD, hypothyroidism, MAI came to hospital with complaint of shortness of breath, cough.   MDM/Assessment & Plan:    Healthcare associated pneumonia-given that she was hospitalized last month, seen on chest x-ray, patient started on IV Levaquin .  She is clinically improving and feeling better, ambulating in the room. Continue levofloxacin .  She is on room air.    Blood culture no growth to date.  Influenza panel is negative   Bronchiectasis/COPD-treated with DuoNebs every 6 hours, Solu-Medrol  60 mg IV every 6 hours, Mucinex  1200 mg p.o. twice daily.  Add cough medication.  Discharged home on a  prednisone  taper.   Hypokalemia-repleted.    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  Because of difficulty with transportation, the patient has requested to follow-up and establish care with Dr. Zoila [name] so that she can be seen by a local pulmonologist.  She understands that he is retiring at the end of the year but another pulmonologist is going to be taking over that practice.   Hypothyroidism-continue Synthroid    6  Bipolar Disorder - Resumed home medications.     History of Present Illness  03/15/2020  Pulmonary/ 1st [name] eval/Melody [name] / Melody [name]  Chief Complaint  Patient presents with   Pulmonary Consult    Former pt of Dr Melody [name]. She c/o occ non prod cough.    Living in same house since around 2008 with neg allergy  profile for mold as above   Dyspnea:  MMRC2 = can't walk a nl pace on a flat grade s sob but does fine slow and flat   Cough: some worse p mowed grass one week prior to OV on Breo which "makes her cough" like something tickling her throat    Sleep: great p sleep med per Melody [name] s resp complaints SABA use: one daily/ rarely  rec Plan A = Automatic = Always=   Stop Breo (powder) Dulera  100 Take 2 puffs first thing in am and then another 2 puffs about 12 hours later.  Work on inhaler technique:  Plan B = Backup (to supplement plan A, not to replace it) Only use your albuterol  inhaler as a rescue medication Plan C = Crisis (  instead of Plan B but only if Plan B stops working) - only use your albuterol  nebulizer if you first try Plan B and it fails to help > ok to use the nebulizer up to every 4 hours but if start needing it regularly call for immediate appointment Please schedule a follow up visit in 3 months but call sooner if needed - please bring inhalers and nebulizer solution    06/20/2020  f/u ov/Melody [name]/Melody [name] re:  Copd I/ bronchiectasis better on dulera   100 2bid / had covid vaccines x 2 / did not bring inhalers Chief Complaint   Patient presents with   Follow-up    Pt c/o cough with green sputum off and on for the past few months.    Dyspnea:  MMRC2 = can't walk a nl pace on a flat grade s sob but does fine slow and flat  Cough: variably purulent esp in am / does not have prn abx Sleeping: flat in bed/ on one pillow  SABA use: hasn't needed at all "you told me to stop these"  (note Plan B / C above)  02: none  rec For nasty mucus  >  zpak  Plan A = Automatic = Always=    dulera  100 Take 2 puffs first thing in am and then another 2 puffs about 12 hours later.  Plan B = Backup (to supplement plan A, not to replace it) Only use your albuterol  inhaler as a rescue medication   Plan C = Crisis (instead of Plan B but only if Plan B stops working) - only use your albuterol  nebulizer if you first try Plan B and it fails to help > ok to use the nebulizer up to every 4 hours      08/31/2020  f/u ov/Melody [name]/Melody [name] re: had flare of green mucus   07/12/20 finished zpak then levaquin  cleared the mucus and cough with neg covd testing 08/12/20 but asked to come in to regroup re longterm rx  Chief Complaint  Patient presents with   Follow-up    sore throat that started this morning  Dyspnea:  No change doe  Cough: none now  Sleeping: ok  SABA use: dulera  100 2bid / none extra  02: no rec Salt water  warm gargles  If worse ok to use magic mouthwash swish and swallow four times daily  As long as you have cough or sorethroat add pepcid 20 mg (famotidine) after supper  For nasty mucus >   Levaquin  500 mg daily x 7 days  We will get you a new nebulzer  Keep your previous appt     01/18/2021  f/u ov/Melody [name]/Melody [name] re: COPD GOLD I/ bronchiecatasis / poor insight into meds on dulera  100/ flutter valve Chief Complaint  Patient presents with   Follow-up    Breathing is "ok". She states that she has been having palpitations "for a while". She states that her "chest feels raw" x 3 days- neb tx helps but she is not  sure what it is. Her cough is prod with clear sputum.   Dyspnea:  MMRC1 = can walk nl pace, flat grade, can't hurry or go uphills or steps s sob  Cough: more freq x one month but  not worse in am / clear mucus only, no change p took levaquin  which she was not supposed so use s change in sputum color/ assoc with nasal congestion which is much worse than usual despite flonase /xyzal /dymista  Sleeping: flat bed / one pillow  SABA use: once a month/ used neb night prior to OV   02: none  Covid status: vax x 2 last 05/02/20  Rec For nasty mucus >   Levaquin  500 mg daily x 7 days For cough/ congestion > mucinex  dm 1200 mg every 12 hours as needed with flutter valve as much as possible  For thrush > clotrimazole  troche 10 mg up to 5 x daily  After using dulera , try arm and hammer toothpaste, make a slurry/ gargle  I will be referring you to a sinus specialist for your nasal congestion > did not see one in Epic or care everywhere database.    Please schedule a follow up visit in 12  months but call sooner if needed>did not do      02/18/2024  Re-establish  ov/[place] [name]/Melody [name] re: bronchiectasis  maint on spiriva  and prn saba   Chief Complaint  Patient presents with   Establish Care   COPD   Dyspnea:  Not limited by breathing from desired activities  / more by back pain  Cough: worse since cold weather > min trace pink mucus   Sleeping: bed is flat/ 2 pillows s noct or early am    resp cc  SABA use: none  02: none      No obvious day to day or daytime variability or assoc  purulent sputum or mucus plugs  or cp or chest tightness, subjective wheeze or overt sinus or hb symptoms.    Also denies any obvious fluctuation of symptoms with weather or environmental changes or other aggravating or alleviating factors except as outlined above   No unusual exposure hx or h/o childhood pna/ asthma or knowledge of premature birth.  Current Allergies, Complete Past Medical History, Past Surgical  History, Family History, and Social History were reviewed in [employer] record.  ROS  The following are not active complaints unless bolded Hoarseness, sore throat, dysphagia, dental problems, itching, sneezing,  nasal congestion or discharge of excess mucus or purulent secretions, ear ache,   fever, chills, sweats, unintended wt loss or wt gain, classically pleuritic or exertional cp,  orthopnea pnd or arm/hand swelling  or leg swelling, presyncope, palpitations, abdominal pain, anorexia, nausea, vomiting, diarrhea  or change in bowel habits or change in bladder habits, change in stools or change in urine, dysuria, hematuria,  rash, arthralgias, visual complaints, headache, numbness, weakness or ataxia or problems with walking or coordination,  change in mood or  memory.        Current Meds  Medication Sig   albuterol  (VENTOLIN  HFA) 108 (90 Base) MCG/ACT inhaler INHALE TWO PUFFS BY MOUTH EVERY 4 HOURS AS NEEDED FOR wheezing OR SHORTNESS OF BREATH (Patient taking differently: Inhale 2 puffs into the lungs every 4 (four) hours as needed for shortness of breath or wheezing.)   ALPRAZolam  (XANAX ) 1 MG tablet Take 1 mg by mouth 3 (three) times daily.   Cholecalciferol (VITAMIN D3) 1.25 MG (50000 UT) CAPS TAKE ONE CAPSULE BY MOUTH EVERY 14 DAYS ON TUESDAYS (Patient taking differently: Take 1 capsule by mouth every 14 (fourteen) days.)   clotrimazole -betamethasone  (LOTRISONE ) cream Apply 1 Application topically daily.   cyanocobalamin  (VITAMIN B12) 1000 MCG/ML injection INJECT 1ML INTO THE SKIN EVERY 14 DAYS (Patient taking differently: Inject 1,000 mcg into the muscle every 14 (fourteen) days.)   diclofenac  Sodium (VOLTAREN ) 1 % GEL APPLY TWO GRAMS TO AFFECTED AREA(S) FOUR TIMES DAILY (Patient taking differently: Apply 2 g topically 4 (four) times daily  as needed (shoulder pain).)   dicyclomine  (BENTYL ) 20 MG tablet Take 1 tablet (20 mg total) by mouth 3 (three) times daily before  meals.   estradiol  (ESTRACE ) 0.1 MG/GM vaginal cream 1 gram at bedtime   fluticasone  (FLONASE ) 50 MCG/ACT nasal spray INSTILL TWO SPRAYS IN EACH NOSTRIL DAILY   guaiFENesin -dextromethorphan  (ROBITUSSIN DM) 100-10 MG/5ML syrup Take 5 mLs by mouth every 4 (four) hours as needed for cough.   levocetirizine (XYZAL ) 5 MG tablet TAKE 1 TABLET BY MOUTH EVERY EVENING (FOR ALLERGY ) (Patient taking differently: Take 5 mg by mouth daily as needed for allergies.)   levothyroxine  (SYNTHROID ) 50 MCG tablet TAKE 1 TABLET BY MOUTH EVERY MORNING   losartan  (COZAAR ) 25 MG tablet TAKE 1 TABLET BY MOUTH DAILY   methylphenidate  (RITALIN ) 20 MG tablet Take 1 tablet (20 mg total) by mouth daily. (Patient taking differently: Take 10-20 mg by mouth daily.)   nitrofurantoin  (MACRODANTIN ) 50 MG capsule Take 1 capsule (50 mg total) by mouth at bedtime.   oxybutynin  (DITROPAN -XL) 10 MG 24 hr tablet Take 1 tablet (10 mg total) by mouth at bedtime. Follow-up appt due in Nov must see provider for future refills   oxyCODONE -acetaminophen  (PERCOCET/ROXICET) 5-325 MG tablet TAKE 1 TABLET BY MOUTH EVERY 8 HOURS AS NEEDED FOR SEVERE pain   pantoprazole  (PROTONIX ) 40 MG tablet TAKE 1 TABLET BY MOUTH TWICE DAILY   QUEtiapine  (SEROQUEL ) 25 MG tablet Take 25 mg by mouth at bedtime.   SPIRIVA  RESPIMAT 2.5 MCG/ACT AERS INHALE TWO PUFFS BY MOUTH EVERY DAY (Patient taking differently: Inhale 2 puffs into the lungs daily.)            Past Medical History:  Diagnosis Date   Anxiety    Bronchiectasis (HCC)    Chronic back pain    Chronic neck pain    Depression    bipolar- Dr Melody [name]   Fatigue    Fibromyalgia    GERD (gastroesophageal reflux disease)    Headache    Hypothyroidism    Low back pain    Dr Larrie Po   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[facility]    Osteoarthritis    Pneumonia    Pulmonary nodule    Sinus congestion    Thyroid  disease    hypothyroidsm   Vertigo    Vitamin B12 deficiency        Objective:     Wts  02/18/2024        126  01/18/2021       131 08/31/2020       133  06/20/20 129 lb (58.5 kg)  03/31/20 129 lb (58.5 kg)  03/15/20 130 lb (59 kg)    Vital signs reviewed  02/18/2024  - Note at rest 02 sats  96% on RA   General appearance:    amb wf nad     HEENT : Oropharynx  clear   Nasal turbinates nl    NECK :  without  apparent JVD/ palpable Nodes/TM    LUNGS: no acc muscle use,  Min barrel  contour chest wall with bilateral  slightly decreased bs s audible wheeze and  without cough on insp or exp maneuvers and min  Hyperresonant  to  percussion bilaterally    CV:  RRR  no s3 or murmur or increase in P2, and no edema   ABD:  soft and nontender with pos end  insp Hoover's  in the supine position.  No bruits or organomegaly appreciated   MS:  Nl gait/ ext  warm without deformities Or obvious joint restrictions  calf tenderness, cyanosis or clubbing     SKIN: warm and dry without lesions    NEURO:  alert, approp, nl sensorium with  no motor or cerebellar deficits apparent.          I personally reviewed images and agree with radiology impression as follows:  CXR:   pa and lateral  02/18/2024  Ill-defined nodular densities within both mid lower lung zones with bronchiectasis on prior CT. Findings are typical of chronic atypical infection such as MAI.    Assessment

## 2024-02-17 NOTE — Telephone Encounter (Signed)
 Patient has since followed up with [facility] specialist

## 2024-02-18 ENCOUNTER — Encounter: Payer: Self-pay | Admitting: Internal Medicine

## 2024-02-18 ENCOUNTER — Ambulatory Visit (HOSPITAL_COMMUNITY)
Admission: RE | Admit: 2024-02-18 | Discharge: 2024-02-18 | Disposition: A | Discharge status: Home / Self Care | Source: Ambulatory Visit | Attending: Internal Medicine | Admitting: Internal Medicine

## 2024-02-18 ENCOUNTER — Ambulatory Visit (INDEPENDENT_AMBULATORY_CARE_PROVIDER_SITE_OTHER): Admitting: Internal Medicine

## 2024-02-18 VITALS — BP 116/68 | HR 90 | Ht 66.0 in | Wt 126.2 lb

## 2024-02-18 DIAGNOSIS — R918 Other nonspecific abnormal finding of lung field: Secondary | ICD-10-CM | POA: Diagnosis not present

## 2024-02-18 DIAGNOSIS — R059 Cough, unspecified: Secondary | ICD-10-CM | POA: Diagnosis not present

## 2024-02-18 DIAGNOSIS — J984 Other disorders of lung: Secondary | ICD-10-CM | POA: Diagnosis not present

## 2024-02-18 DIAGNOSIS — J449 Chronic obstructive pulmonary disease, unspecified: Secondary | ICD-10-CM

## 2024-02-18 DIAGNOSIS — J479 Bronchiectasis, uncomplicated: Secondary | ICD-10-CM | POA: Diagnosis not present

## 2024-02-18 DIAGNOSIS — Z87891 Personal history of nicotine dependence: Secondary | ICD-10-CM | POA: Diagnosis not present

## 2024-02-18 MED ORDER — LEVOFLOXACIN 500 MG PO TABS: 500.0000 mg | ORAL_TABLET | Freq: Every day | ORAL | 0 refills | Status: DC

## 2024-02-18 MED ORDER — CLOTRIMAZOLE 10 MG MT TROC: 10.0000 mg | Freq: Every day | OROMUCOSAL | 11 refills | Status: AC

## 2024-02-18 NOTE — Patient Instructions (Addendum)
 Please remember to go to the  x-ray department  @  [facility] for your tests - we will call you with the results when they are available     For nasty mucus >   Levaquin  500 mg daily x 7 days  For cough/ congestion > mucinex  dm 1200 mg every 12 hours as needed with flutter valve as much as possible   For thrush > clotrimazole  troche 10 mg up to 5 x daily   Please schedule a follow up office visit in 6 weeks, call sooner if needed with all medications /inhalers/ solutions in hand so we can verify exactly what you are taking. This includes all medications from all doctors and over the counters.

## 2024-02-19 ENCOUNTER — Ambulatory Visit: Admitting: Obstetrics & Gynecology

## 2024-02-19 ENCOUNTER — Encounter: Payer: Self-pay | Admitting: Obstetrics & Gynecology

## 2024-02-19 ENCOUNTER — Telehealth: Payer: Self-pay | Admitting: Internal Medicine

## 2024-02-19 VITALS — BP 124/71 | HR 73 | Wt 122.8 lb

## 2024-02-19 DIAGNOSIS — F419 Anxiety disorder, unspecified: Secondary | ICD-10-CM | POA: Diagnosis not present

## 2024-02-19 DIAGNOSIS — Z9889 Other specified postprocedural states: Secondary | ICD-10-CM | POA: Diagnosis not present

## 2024-02-19 DIAGNOSIS — R4589 Other symptoms and signs involving emotional state: Secondary | ICD-10-CM

## 2024-02-19 NOTE — Telephone Encounter (Signed)
  Melody Formica, MD 02/18/2024  7:55 PM EDT     Call pt:  Reviewed cxr and it is consistent with MAI but no indication to change recs from AVS      Spoke with pt and notified of results per Dr. Waymond Hailey. Pt verbalized understanding and denied any questions.

## 2024-02-19 NOTE — Telephone Encounter (Signed)
 02/18/24  7:55 PM Result Note Call pt:  Reviewed cxr and it is consistent with MAI but no indication to change recs from AVS DG Chest 2 View  Tried calling the pt and there was no answer- LMTCB

## 2024-02-19 NOTE — Progress Notes (Signed)
 Phone note created.- LMTCB.

## 2024-02-19 NOTE — Progress Notes (Signed)
 GYN VISIT Patient name: Melody [name] MRN [id number]  Date of birth: 09/10/1956 Chief Complaint:   Follow-up (Wants to talk about having her bladder tacked)  History of Present Illness:   Melody [name] is a 68 y.o. G2P2002 PM, PH female being seen today for the following concerns:  She presents today regarding her anxiety about her bladder.  She reports that she is afraid her bladder is going to fall out after her last visit with Dr. Randolm Butte.  Based on Dr. Deneen Finical note it does not appear as though there was concern for significant prolapse.  It seems as though patient had recurrent UTI in vaginal atrophy.  She was started on antibiotic suppression and vaginal estrogen.  She reports that she has been taking the antibiotic, but was unable to complete the estrogen therapy because she notes it caused more itching and irritation.  Again she is just anxious today about her bladder and the potential for it to fall out.  She otherwise denies vaginal bleeding or abdominal pain.  She denies urinary concerns currently.  In review, pt s/p TAH, BSO, A/P repair, TVT and sacrocolpopexy with  Dr. Colvin Dec. .     Patient's last menstrual period was 10/29/2006 (approximate).    Review of Systems:   Pertinent items are noted in HPI Denies fever/chills, dizziness, headaches, visual disturbances, fatigue, shortness of breath, chest pain, abdominal pain, vomiting Pertinent History Reviewed:   Past Surgical History:  Procedure Laterality Date   ABDOMINAL HYSTERECTOMY N/A 01/10/2016   Procedure: HYSTERECTOMY ABDOMINAL;  Surgeon: Greta Leatherwood, MD;  Location: [facility];  Service: Gynecology;  Laterality: N/A;   ABDOMINAL SACROCOLPOPEXY N/A 01/10/2016   Procedure: ABDOMINO SACROCOLPOPEXY ;  Surgeon: Greta Leatherwood, MD;  Location: [facility];  Service: Gynecology;  Laterality: N/A;   ANTERIOR AND POSTERIOR REPAIR N/A 01/10/2016   Procedure:  POSTERIOR REPAIR (RECTOCELE);  Surgeon: Greta Leatherwood, MD;  Location: [facility];  Service: Gynecology;  Laterality: N/A;   BIOPSY  10/19/2022   Procedure: BIOPSY;  Surgeon: Urban Garden, MD;  Location: [facility];  Service: Gastroenterology;;   BLADDER SUSPENSION N/A 01/10/2016   Procedure: TRANSVAGINAL TAPE (TVT) PROCEDURE exact midurethral sling;  Surgeon: Greta Leatherwood, MD;  Location: [facility];  Service: Gynecology;  Laterality: N/A;   CERVICAL LAMINECTOMY  2001 and 2005   Dr Larrie Po    ACDF  C5-6, C6-7   CHOLECYSTECTOMY N/A 09/23/2023   Procedure: LAPAROSCOPIC CHOLECYSTECTOMY;  Surgeon: Adalberto Acton, MD;  Location: [facility];  Service: General;  Laterality: N/A;   COLONOSCOPY WITH PROPOFOL  N/A 10/19/2022   Procedure: COLONOSCOPY WITH PROPOFOL ;  Surgeon: Urban Garden, MD;  Location: [facility];  Service: Gastroenterology;  Laterality: N/A;  10:30am, asa 1-2   CYSTO N/A 01/10/2016   Procedure: CYSTOSCOPY;  Surgeon: Greta Leatherwood, MD;  Location: [facility];  Service: Gynecology;  Laterality: N/A;   ESOPHAGEAL DILATION N/A 10/19/2022   Procedure: ESOPHAGEAL DILATION;  Surgeon: Urban Garden, MD;  [facility]: [facility];  Service: Gastroenterology;  Laterality: N/A;   ESOPHAGOGASTRODUODENOSCOPY (EGD) WITH PROPOFOL  N/A 10/19/2022   Procedure: ESOPHAGOGASTRODUODENOSCOPY (EGD) WITH PROPOFOL ;  Surgeon: Urban Garden, MD;  [facility]: [facility];  Service: Gastroenterology;  Laterality: N/A;   POLYPECTOMY  10/19/2022   Procedure: POLYPECTOMY INTESTINAL;  Surgeon: Urban Garden, MD;  [facility]: [facility];  Service: Gastroenterology;;   SALPINGOOPHORECTOMY Bilateral 01/10/2016  Procedure: BILATERAL SALPINGO OOPHORECTOMY;  Surgeon: Greta Leatherwood, MD;  Location: [facility];  Service: Gynecology;  Laterality: Bilateral;   SAVORY DILATION  10/19/2022   Procedure: SAVORY DILATION;  Surgeon: Umberto Ganong, Bearl Limes, MD;  Location: [facility];  Service:  Gastroenterology;;   TUBAL LIGATION  10/29/1978    Past Medical History:  Diagnosis Date   Anxiety    Blood dyscrasia    thrombocytopenia   Bronchiectasis (HCC)    Chronic back pain    Chronic neck pain    COPD (chronic obstructive pulmonary disease) (HCC)    Depression    bipolar- Dr [name]   Fibromyalgia    GERD (gastroesophageal reflux disease)    Headache    Migraine,   History of hiatal hernia    Hypertension    Hypothyroidism    Low back pain    Dr Larrie Po   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[facility])    Osteoarthritis    Pneumonia    Pulmonary nodule    Vertigo    Vitamin B12 deficiency    Reviewed problem list, medications and allergies. Physical Assessment:   Vitals:   02/19/24 1618  BP: 124/71  Pulse: 73  Weight: 122 lb 12.8 oz (55.7 kg)  Body mass index is 19.82 kg/m.       Physical Examination:   General appearance: alert, well appearing, and in no distress  Psych: mood appropriate, normal affect  Skin: warm & dry   Cardiovascular: normal heart rate noted  Respiratory: normal respiratory effort, no distress  Abdomen: soft, non-tender   Pelvic: examination declined by patient  Extremities: no edema   Chaperone: N/A    Assessment & Plan:  1) anxiety about health - Reassured patient that based on Dr. Deneen Finical note there was no acute concern for prolapse additionally  -reassured patient that her bladder would not just "fall out " - Questions and concerns were addressed   No orders of the defined types were placed in this encounter.   Return if symptoms worsen or fail to improve, for 36mos-74yr with Dr. Randolm Butte.   Aneth Schlagel, DO Attending Obstetrician & Gynecologist, [facility] for [employer], [facility]

## 2024-02-25 ENCOUNTER — Encounter: Payer: Self-pay | Admitting: Internal Medicine

## 2024-02-25 NOTE — Assessment & Plan Note (Signed)
 Quit smoking 2012  - PFT's with GOLD 1 criteria 04/2017  - 03/15/2020  Very poor hfa but cough / throat irritation from BREO > try dulera  100 2bid   > improved  - 01/18/2021  After extensive coaching inhaler device,  effectiveness =    90%  Vaccinated - Prevnar 20, RSV, flu Recurrent thrush w/Dulera  - d/c 08/2022 Rx Spiriva  respimat qd and Albuterol  prn  Not limited by breathing/ avoiding steroids here due to concern with MAI and no tendency to aecopd so no change in rx needed

## 2024-02-25 NOTE — Assessment & Plan Note (Signed)
 See CT chest 07/22/17 -  08/18/17  MAI isolated from sputum, sensitive to Biaxin  > 06/20/2020 rec zpak prn purulent sputum  -   08/13/20 reported refractory green mucus so changed to levaquin  and cleared it up  -  rec for flares rx levaquin  500 x 7 days 02/25/2024 >>>   Doing well with minimal need for levaquin  cycles  so no change rx :  For nasty mucus >   Levaquin  500 mg daily x 7 days  For cough/ congestion > mucinex  dm 1200 mg every 12 hours as needed with flutter valve as much as possible   For thrush > clotrimazole  troche 10 mg up to 5 x daily    F/u in 6 weeks with all meds/devices  in hand using a trust but verify approach to confirm accurate Medication  Reconciliation and approp use of devices.  The  principal here is that until we are certain that the  patients are doing what we've asked, it makes no sense to ask them to do more.          Each maintenance medication was reviewed in detail including emphasizing most importantly the difference between maintenance and prns and under what circumstances the prns are to be triggered using an action plan format where appropriate.  Total time for H and P, chart review, counseling, reviewing hfa/dpi/ flutter  device(s) and generating customized AVS unique to this office visit / same day charting  = 30 min total with pt not seen by me for over 3 y

## 2024-02-26 ENCOUNTER — Ambulatory Visit (HOSPITAL_COMMUNITY)
Admission: RE | Admit: 2024-02-26 | Discharge: 2024-02-26 | Disposition: A | Discharge status: Home / Self Care | Source: Ambulatory Visit | Attending: Gastroenterology | Admitting: Gastroenterology

## 2024-02-26 DIAGNOSIS — R1030 Lower abdominal pain, unspecified: Secondary | ICD-10-CM | POA: Insufficient documentation

## 2024-02-26 DIAGNOSIS — R634 Abnormal weight loss: Secondary | ICD-10-CM | POA: Diagnosis not present

## 2024-02-26 DIAGNOSIS — K5730 Diverticulosis of large intestine without perforation or abscess without bleeding: Secondary | ICD-10-CM | POA: Diagnosis not present

## 2024-02-26 DIAGNOSIS — R109 Unspecified abdominal pain: Secondary | ICD-10-CM | POA: Diagnosis not present

## 2024-02-26 MED ORDER — IOHEXOL 350 MG/ML SOLN
100.0000 mL | Freq: Once | INTRAVENOUS | Status: AC | PRN
  Administered 2024-02-26: 100 mL via INTRAVENOUS

## 2024-02-27 ENCOUNTER — Telehealth: Payer: Self-pay | Admitting: Internal Medicine

## 2024-02-27 ENCOUNTER — Ambulatory Visit: Payer: Self-pay

## 2024-02-27 ENCOUNTER — Ambulatory Visit (INDEPENDENT_AMBULATORY_CARE_PROVIDER_SITE_OTHER): Admitting: Internal Medicine

## 2024-02-27 ENCOUNTER — Encounter: Payer: Self-pay | Admitting: Internal Medicine

## 2024-02-27 VITALS — BP 160/108 | HR 86 | Temp 98.0°F | Ht 66.0 in | Wt 125.2 lb

## 2024-02-27 DIAGNOSIS — G8929 Other chronic pain: Secondary | ICD-10-CM | POA: Diagnosis not present

## 2024-02-27 DIAGNOSIS — K5904 Chronic idiopathic constipation: Secondary | ICD-10-CM | POA: Diagnosis not present

## 2024-02-27 DIAGNOSIS — R634 Abnormal weight loss: Secondary | ICD-10-CM | POA: Diagnosis not present

## 2024-02-27 DIAGNOSIS — R1084 Generalized abdominal pain: Secondary | ICD-10-CM | POA: Diagnosis not present

## 2024-02-27 DIAGNOSIS — J479 Bronchiectasis, uncomplicated: Secondary | ICD-10-CM

## 2024-02-27 DIAGNOSIS — M5440 Lumbago with sciatica, unspecified side: Secondary | ICD-10-CM

## 2024-02-27 DIAGNOSIS — K5900 Constipation, unspecified: Secondary | ICD-10-CM | POA: Insufficient documentation

## 2024-02-27 DIAGNOSIS — R39198 Other difficulties with micturition: Secondary | ICD-10-CM

## 2024-02-27 DIAGNOSIS — E559 Vitamin D deficiency, unspecified: Secondary | ICD-10-CM | POA: Diagnosis not present

## 2024-02-27 DIAGNOSIS — F319 Bipolar disorder, unspecified: Secondary | ICD-10-CM

## 2024-02-27 DIAGNOSIS — E538 Deficiency of other specified B group vitamins: Secondary | ICD-10-CM

## 2024-02-27 DIAGNOSIS — M545 Low back pain, unspecified: Secondary | ICD-10-CM

## 2024-02-27 LAB — CBC WITH DIFFERENTIAL/PLATELET
Basophils Absolute: 0 10*3/uL (ref 0.0–0.1)
Basophils Relative: 0.4 % (ref 0.0–3.0)
Eosinophils Absolute: 0 10*3/uL (ref 0.0–0.7)
Eosinophils Relative: 0.2 % (ref 0.0–5.0)
HCT: 39.1 % (ref 36.0–46.0)
Hemoglobin: 12.9 g/dL (ref 12.0–15.0)
Lymphocytes Relative: 23.1 % (ref 12.0–46.0)
Lymphs Abs: 1.5 10*3/uL (ref 0.7–4.0)
MCHC: 33.1 g/dL (ref 30.0–36.0)
MCV: 85 fl (ref 78.0–100.0)
Monocytes Absolute: 0.4 10*3/uL (ref 0.1–1.0)
Monocytes Relative: 6.3 % (ref 3.0–12.0)
Neutro Abs: 4.7 10*3/uL (ref 1.4–7.7)
Neutrophils Relative %: 70 % (ref 43.0–77.0)
Platelets: 167 10*3/uL (ref 150.0–400.0)
RBC: 4.6 Mil/uL (ref 3.87–5.11)
RDW: 14.3 % (ref 11.5–15.5)
WBC: 6.7 10*3/uL (ref 4.0–10.5)

## 2024-02-27 LAB — COMPREHENSIVE METABOLIC PANEL WITH GFR
ALT: 6 U/L (ref 0–35)
AST: 11 U/L (ref 0–37)
Albumin: 4.1 g/dL (ref 3.5–5.2)
Alkaline Phosphatase: 79 U/L (ref 39–117)
BUN: 11 mg/dL (ref 6–23)
CO2: 29 meq/L (ref 19–32)
Calcium: 9.2 mg/dL (ref 8.4–10.5)
Chloride: 106 meq/L (ref 96–112)
Creatinine, Ser: 0.72 mg/dL (ref 0.40–1.20)
GFR: 85.4 mL/min (ref >60.00)
Glucose, Bld: 91 mg/dL (ref 70–99)
Potassium: 3.6 meq/L (ref 3.5–5.1)
Sodium: 142 meq/L (ref 135–145)
Total Bilirubin: 0.7 mg/dL (ref 0.2–1.2)
Total Protein: 6.9 g/dL (ref 6.0–8.3)

## 2024-02-27 LAB — LIPASE: Lipase: 12 U/L (ref 11.0–59.0)

## 2024-02-27 LAB — CORTISOL: Cortisol, Plasma: 5.1 ug/dL

## 2024-02-27 LAB — VITAMIN D 25 HYDROXY (VIT D DEFICIENCY, FRACTURES): VITD: 105.33 ng/mL (ref 30.00–100.00)

## 2024-02-27 LAB — T4, FREE: Free T4: 1.04 ng/dL (ref 0.60–1.60)

## 2024-02-27 LAB — VITAMIN B12: Vitamin B-12: 315 pg/mL (ref 211–911)

## 2024-02-27 MED ORDER — LACTULOSE 20 GM/30ML PO SOLN: 30.0000 mL | Freq: Two times a day (BID) | ORAL | 3 refills | Status: AC | PRN

## 2024-02-27 NOTE — Telephone Encounter (Signed)
 Shaa calling in to report critical lab results.  Vitamin D  105.33  On call provider contacted and informed of results.    Reason for Disposition  Lab or radiology calling with CRITICAL test results  Answer Assessment - Initial Assessment Questions 1. REASON FOR CALL or QUESTION: "What is your reason for calling today?" or "How can I best help you?" or "What question do you have that I can help answer?"     Critical lab results  2. CALLER: Document the source of call. (e.g., laboratory, patient).     Lab  Protocols used: PCP Call - No Triage-A-AH

## 2024-02-27 NOTE — Assessment & Plan Note (Signed)
F/u w/GI 

## 2024-02-27 NOTE — Assessment & Plan Note (Signed)
"  I can't pee" - stop Oxybutinine 01/2024 Abd CT: Pelvic floor laxity with small cystocel

## 2024-02-27 NOTE — Assessment & Plan Note (Signed)
 Check Vit D

## 2024-02-27 NOTE — Assessment & Plan Note (Addendum)
 F/u w/Dr  Waymond Hailey Just finished Levaquin  MAI colonization

## 2024-02-27 NOTE — Assessment & Plan Note (Signed)
 On Percocet prn  Potential benefits of a long term opioids use as well as potential risks (i.e. addiction risk, apnea etc) and complications (i.e. Somnolence, constipation and others) were explained to the patient and were aknowledged.

## 2024-02-27 NOTE — Assessment & Plan Note (Signed)
 On Seroquel F/u w/Dr Evelene Croon

## 2024-02-27 NOTE — Telephone Encounter (Signed)
 02/27/24 - FYI - received call from on call service regarding lab value.  Vitamin D  level - 105.

## 2024-02-27 NOTE — Assessment & Plan Note (Signed)
 Lactulose  po - see Rx

## 2024-02-27 NOTE — Progress Notes (Signed)
 Subjective:  Patient ID: Melody [name], female    DOB: Mar 13, 1956  Age: 68 y.o. MRN: [id number]  CC: Abdominal Pain (4 week follow up. Patient notes she's still experiencing some lower abdominal pain. Patient still experiencing depression, not wanting to eat, socialize around others, dizziness. Has since followed up with their gastroenterologist and pulmonologist. Review CT results. )   HPI Melody [name] presents for multiple issues - abd pain (had abd CT yesterday), no appetite, wt loss, constipation/diarrhea "I can't pee" Just finished Levaquin   Outpatient Medications Prior to Visit  Medication Sig Dispense Refill   albuterol  (VENTOLIN  HFA) 108 (90 Base) MCG/ACT inhaler INHALE TWO PUFFS BY MOUTH EVERY 4 HOURS AS NEEDED FOR wheezing OR SHORTNESS OF BREATH (Patient taking differently: Inhale 2 puffs into the lungs every 4 (four) hours as needed for shortness of breath or wheezing.) 8.5 g 5   ALPRAZolam  (XANAX ) 1 MG tablet Take 1 mg by mouth 3 (three) times daily.     Cholecalciferol (VITAMIN D3) 1.25 MG (50000 UT) CAPS TAKE ONE CAPSULE BY MOUTH EVERY 14 DAYS ON TUESDAYS (Patient taking differently: Take 1 capsule by mouth every 14 (fourteen) days.) 6 capsule 3   clotrimazole  (MYCELEX ) 10 MG troche Take 1 tablet (10 mg total) by mouth 5 (five) times daily. 30 Troche 11   clotrimazole -betamethasone  (LOTRISONE ) cream Apply 1 Application topically daily. 30 g 0   cyanocobalamin  (VITAMIN B12) 1000 MCG/ML injection INJECT 1ML INTO THE SKIN EVERY 14 DAYS (Patient taking differently: Inject 1,000 mcg into the muscle every 14 (fourteen) days.) 6 mL 3   diclofenac  Sodium (VOLTAREN ) 1 % GEL APPLY TWO GRAMS TO AFFECTED AREA(S) FOUR TIMES DAILY (Patient taking differently: Apply 2 g topically 4 (four) times daily as needed (shoulder pain).) 200 g 3   dicyclomine  (BENTYL ) 20 MG tablet Take 1 tablet (20 mg total) by mouth 3 (three) times daily before meals. 90 tablet 0   estradiol  (ESTRACE ) 0.1 MG/GM  vaginal cream 1 gram at bedtime 30 g 12   fluticasone  (FLONASE ) 50 MCG/ACT nasal spray INSTILL TWO SPRAYS IN EACH NOSTRIL DAILY 48 g 3   guaiFENesin -dextromethorphan  (ROBITUSSIN DM) 100-10 MG/5ML syrup Take 5 mLs by mouth every 4 (four) hours as needed for cough. 118 mL 0   levocetirizine (XYZAL ) 5 MG tablet TAKE 1 TABLET BY MOUTH EVERY EVENING (FOR ALLERGY ) (Patient taking differently: Take 5 mg by mouth daily as needed for allergies.) 90 tablet 3   levofloxacin  (LEVAQUIN ) 500 MG tablet Take 1 tablet (500 mg total) by mouth daily. 7 tablet 0   levothyroxine  (SYNTHROID ) 50 MCG tablet TAKE 1 TABLET BY MOUTH EVERY MORNING 90 tablet 3   losartan  (COZAAR ) 25 MG tablet TAKE 1 TABLET BY MOUTH DAILY 30 tablet 5   methylphenidate  (RITALIN ) 20 MG tablet Take 1 tablet (20 mg total) by mouth daily. (Patient taking differently: Take 10-20 mg by mouth daily.)  0   nitrofurantoin  (MACRODANTIN ) 50 MG capsule Take 1 capsule (50 mg total) by mouth at bedtime. 30 capsule 3   oxyCODONE -acetaminophen  (PERCOCET/ROXICET) 5-325 MG tablet TAKE 1 TABLET BY MOUTH EVERY 8 HOURS AS NEEDED FOR SEVERE pain 60 tablet 0   pantoprazole  (PROTONIX ) 40 MG tablet TAKE 1 TABLET BY MOUTH TWICE DAILY 60 tablet 5   QUEtiapine  (SEROQUEL ) 25 MG tablet Take 25 mg by mouth at bedtime.     SPIRIVA  RESPIMAT 2.5 MCG/ACT AERS INHALE TWO PUFFS BY MOUTH EVERY DAY (Patient taking differently: Inhale 2 puffs into the lungs daily.) 4  g 11   oxybutynin  (DITROPAN -XL) 10 MG 24 hr tablet Take 1 tablet (10 mg total) by mouth at bedtime. Follow-up appt due in Nov must see provider for future refills 30 tablet 5   No facility-administered medications prior to visit.    ROS: Review of Systems  Constitutional:  Positive for fatigue. Negative for activity change, appetite change, chills and unexpected weight change.  HENT:  Negative for congestion, mouth sores and sinus pressure.   Eyes:  Negative for photophobia and visual disturbance.  Respiratory:   Negative for cough and chest tightness.   Gastrointestinal:  Positive for constipation. Negative for abdominal pain and nausea.  Genitourinary:  Negative for difficulty urinating, frequency, hematuria and vaginal pain.  Musculoskeletal:  Positive for arthralgias and back pain. Negative for gait problem.  Skin:  Negative for pallor, rash and wound.  Neurological:  Positive for weakness. Negative for dizziness, tremors, numbness and headaches.  Hematological:  Bruises/bleeds easily.  Psychiatric/Behavioral:  Positive for dysphoric mood and sleep disturbance. Negative for behavioral problems, confusion, hallucinations and suicidal ideas. The patient is nervous/anxious.     Objective:  BP (!) 160/108   Pulse 86   Temp 98 F (36.7 C)   Ht 5\' 6"  (1.676 m)   Wt 125 lb 3.2 oz (56.8 kg)   LMP 10/29/2006 (Approximate)   SpO2 97%   BMI 20.21 kg/m   BP Readings from Last 3 Encounters:  02/27/24 (!) 160/108  02/19/24 124/71  02/18/24 116/68    Wt Readings from Last 3 Encounters:  02/27/24 125 lb 3.2 oz (56.8 kg)  02/19/24 122 lb 12.8 oz (55.7 kg)  02/18/24 126 lb 4 oz (57.3 kg)    Physical Exam Constitutional:      General: She is not in acute distress.    Appearance: She is well-developed.  HENT:     Head: Normocephalic.     Right Ear: External ear normal.     Left Ear: External ear normal.     Nose: Nose normal.  Eyes:     General:        Right eye: No discharge.        Left eye: No discharge.     Conjunctiva/sclera: Conjunctivae normal.     Pupils: Pupils are equal, round, and reactive to Spaziani.  Neck:     Thyroid : No thyromegaly.     Vascular: No JVD.     Trachea: No tracheal deviation.  Cardiovascular:     Rate and Rhythm: Normal rate and regular rhythm.     Heart sounds: Normal heart sounds.  Pulmonary:     Effort: No respiratory distress.     Breath sounds: No stridor. No wheezing.  Abdominal:     General: Bowel sounds are normal. There is no distension.      Palpations: Abdomen is soft. There is no mass.     Tenderness: There is no abdominal tenderness. There is no guarding or rebound.  Musculoskeletal:        General: No tenderness.     Cervical back: Normal range of motion and neck supple. No rigidity.  Lymphadenopathy:     Cervical: No cervical adenopathy.  Skin:    Findings: No erythema or rash.  Neurological:     Cranial Nerves: No cranial nerve deficit.     Motor: No abnormal muscle tone.     Coordination: Coordination normal.     Deep Tendon Reflexes: Reflexes normal.  Psychiatric:        Behavior: Behavior  normal.        Thought Content: Thought content normal.        Judgment: Judgment normal.     A total time of 45 minutes was spent preparing to see the patient, reviewing tests, x-rays, CT and other medical records.  Also, obtaining history and performing comprehensive physical exam.  Additionally, counseling the patient regarding the above listed issues.   Finally, documenting clinical information in the health records, coordination of care, educating the patient. It is a complex case.   Lab Results  Component Value Date   WBC 7.7 09/18/2023   HGB 13.5 09/18/2023   HCT 41.8 09/18/2023   PLT 192 09/18/2023   GLUCOSE 97 09/18/2023   CHOL 197 05/28/2022   TRIG 86.0 05/28/2022   HDL 59.90 05/28/2022   LDLCALC 120 (H) 05/28/2022   ALT 6 08/23/2023   AST 12 08/23/2023   NA 140 09/18/2023   K 4.3 09/18/2023   CL 108 09/18/2023   CREATININE 1.13 (H) 09/18/2023   BUN 18 09/18/2023   CO2 25 09/18/2023   TSH 2.40 08/23/2023   INR 1.0 04/03/2023    CT Angio Abd/Pel w/ and/or w/o Result Date: 02/27/2024 EXAMINATION: CT ANGIO ABDOMEN PELVIS  W & WO CONTRAST CLINICAL INDICATION: Female, 68 years old. postprandial abdominal pain, weigth loss, rule out chronic mesenteric ischemia TECHNIQUE: Axial CTA of the abdomen and pelvis with and without 100 cc Omnipaque  300 intravenous contrast. Multiplanar and 3D reformations provided.  Unless otherwise specified, incidental thyroid , adrenal, renal lesions do not require dedicated imaging follow up. Additionally, any mentioned pulmonary nodules do not require dedicated imaging follow-up based on the Fleischner guidelines unless otherwise specified. Coronary calcifications are not identified unless otherwise specified. COMPARISON: 01/03/2024 FINDINGS: The abdominal aorta is normal in caliber. Scattered atherosclerotic changes are present. The celiac artery and SMA are patent. The renal arteries and IMA are patent. The common iliac arteries, internal and external iliac arteries, common femoral arteries and visualized portions of the superficial and deep femoral arteries appear patent. Bronchiectasis and tree-in-bud nodularity noted in the lung bases, right greater than left. The heart is normal in size. The liver appears normal. The gallbladder is surgically absent. The spleen is normal. The pancreas is normal. The adrenals are normal. The kidneys are normal. The urinary bladder is normal. The uterus is surgically absent. There is colonic diverticulosis. Large and small bowel loops are otherwise within normal limits. No free fluid or lymphadenopathy. There are degenerative changes of the spine and bony pelvis. IMPRESSION: Patent mesenteric arteries. 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 Short-term follow-up dedicated chest CT should be considered. DOSE REDUCTION: This exam was performed according to our departmental dose-optimization program which includes automated exposure control, adjustment of the mA and/or kV according to patient size and/or use of iterative reconstruction technique. Electronically signed by: [place] Engel MD 02/27/2024 12:23 PM EDT RP Workstation: [id number]    Assessment & Plan:   Problem List Items Addressed This Visit     Bipolar I disorder (HCC) (Chronic)    On Seroquel  F/u w/Dr [name]       Vitamin B12 deficiency   Check B12      Relevant Orders   Vitamin B12   Vitamin D  deficiency   Check Vit D      Relevant Orders   VITAMIN D  25 Hydroxy (Vit-D Deficiency, Fractures)   Low back pain   Recurrent Percocet  prn  Potential benefits of a long term opioids use as well as potential risks (i.e. addiction risk, apnea etc) and complications (i.e. Somnolence, constipation and others) were explained to the patient and were aknowledged.  Potential benefits of a long term NSAID  use as well as potential risks  and complications were explained to the patient and were aknowledged.      Relevant Orders   Comprehensive metabolic panel with GFR   Lipase   CBC with Differential/Platelet   Urinalysis   T4, free   Cortisol   Abdominal pain   Relevant Orders   Comprehensive metabolic panel with GFR   Lipase   CBC with Differential/Platelet   Cortisol   Loss of weight   F/u w/GI      Bronchiectasis (HCC)   F/u w/Dr  Waymond Hailey Just finished Levaquin  MAI colonization      Relevant Orders   Comprehensive metabolic panel with GFR   Lipase   CBC with Differential/Platelet   Urinalysis   T4, free   Cortisol   Chronic back pain   On Percocet prn  Potential benefits of a long term opioids use as well as potential risks (i.e. addiction risk, apnea etc) and complications (i.e. Somnolence, constipation and others) were explained to the patient and were aknowledged.       Relevant Orders   Comprehensive metabolic panel with GFR   Lipase   CBC with Differential/Platelet   Urinalysis   T4, free   Cortisol   Difficulty in urination - Primary   "I can't pee" - stop Oxybutinine 01/2024 Abd CT: Pelvic floor laxity with small cystocel       Constipation   Lactulose  po - see Rx         Meds ordered this encounter  Medications   Lactulose  20 GM/30ML SOLN    Sig: Take 30-60 mLs (20-40 g total) by mouth 2 (two) times daily as needed  (severe constipation).    Dispense:  450 mL    Refill:  3      Follow-up: Return in about 6 weeks (around 04/09/2024) for a follow-up visit.  Anitra Barn, MD

## 2024-02-27 NOTE — Assessment & Plan Note (Signed)
Recurrent Percocet prn  Potential benefits of a long term opioids use as well as potential risks (i.e. addiction risk, apnea etc) and complications (i.e. Somnolence, constipation and others) were explained to the patient and were aknowledged.  Potential benefits of a long term NSAID  use as well as potential risks  and complications were explained to the patient and were aknowledged.

## 2024-02-27 NOTE — Assessment & Plan Note (Signed)
 Check B12

## 2024-02-28 LAB — URINALYSIS, ROUTINE W REFLEX MICROSCOPIC
Bilirubin Urine: NEGATIVE
Hgb urine dipstick: NEGATIVE
Ketones, ur: NEGATIVE
Nitrite: NEGATIVE
Specific Gravity, Urine: 1.02 (ref 1.000–1.030)
Total Protein, Urine: NEGATIVE
Urine Glucose: NEGATIVE
Urobilinogen, UA: 0.2 (ref 0.0–1.0)
pH: 6 (ref 5.0–8.0)

## 2024-02-28 NOTE — Telephone Encounter (Signed)
 Please instruct Raquelin to stop taking vitamin D .  Thanks

## 2024-03-02 ENCOUNTER — Other Ambulatory Visit (INDEPENDENT_AMBULATORY_CARE_PROVIDER_SITE_OTHER): Payer: Self-pay | Admitting: Gastroenterology

## 2024-03-02 MED ORDER — DICYCLOMINE HCL 20 MG PO TABS: 20.0000 mg | ORAL_TABLET | Freq: Three times a day (TID) | ORAL | 1 refills | Status: DC

## 2024-03-03 ENCOUNTER — Ambulatory Visit: Payer: Self-pay

## 2024-03-03 NOTE — Telephone Encounter (Signed)
 This RN attempted to contact patient. 1st attempt. LVM.

## 2024-03-03 NOTE — Telephone Encounter (Signed)
 Chief Complaint: Cough Symptoms: Cough, runny nose Frequency: 4 days Pertinent Negatives: Patient denies fever Disposition: [] ED /[] Urgent Care (no appt availability in office) / [] Appointment(In office/virtual)/ []  [facility]/ [x] Home Care/ [] Refused Recommended Disposition /[] [facility]/ [x]  Follow-up with PCP Additional Notes: Patient called in and stated she has had a cough for 4 days. Patient denies coughing up phlegm but states she feels congestion in her chest, along with a runny nose. Patient denies wheezing, difficulty breathing, and fever. Patient states PCP is familiar with her and is familiar with her needing a Z Pack when she gets a cough. Patient asking if PCP can call in a Z Pack to [facility]. Please advise.   Reason for Disposition  Cough with cold symptoms (e.g., runny nose, postnasal drip, throat clearing)  Answer Assessment - Initial Assessment Questions 1. ONSET: "When did the cough begin?"      4 days ago 2. SEVERITY: "How bad is the cough today?"      Dry cough but around the clock 3. SPUTUM: "Describe the color of your sputum" (none, dry cough; clear, white, yellow, green)     No sputum 4. HEMOPTYSIS: "Are you coughing up any blood?" If so ask: "How much?" (flecks, streaks, tablespoons, etc.)     None 5. DIFFICULTY BREATHING: "Are you having difficulty breathing?" If Yes, ask: "How bad is it?" (e.g., mild, moderate, severe)    - MILD: No SOB at rest, mild SOB with walking, speaks normally in sentences, can lie down, no retractions, pulse < 100.    - MODERATE: SOB at rest, SOB with minimal exertion and prefers to sit, cannot lie down flat, speaks in phrases, mild retractions, audible wheezing, pulse 100-120.    - SEVERE: Very SOB at rest, speaks in single words, struggling to breathe, sitting hunched forward, retractions, pulse > 120      No more than normal 6. FEVER: "Do you have a fever?" If Yes, ask: "What is your temperature, how was it  measured, and when did it start?"     No 7. CARDIAC HISTORY: "Do you have any history of heart disease?" (e.g., heart attack, congestive heart failure)      No 8. LUNG HISTORY: "Do you have any history of lung disease?"  (e.g., pulmonary embolus, asthma, emphysema)     Patient states she does but uncertain 9. PE RISK FACTORS: "Do you have a history of blood clots?" (or: recent major surgery, recent prolonged travel, bedridden)     No 10. OTHER SYMPTOMS: "Do you have any other symptoms?" (e.g., runny nose, wheezing, chest pain)    Runny nose  Protocols used: Cough - Acute Productive-A-AH

## 2024-03-03 NOTE — Telephone Encounter (Signed)
 Copied from CRM [id number]. Topic: Clinical - Medication Question >> Mar 03, 2024  3:03 PM Shereese L wrote: Reason for EAV:[id number] has a severe cough since her last visit and needs a script called in to relieve it.  Patient requesting a Z-pak

## 2024-03-04 ENCOUNTER — Telehealth (INDEPENDENT_AMBULATORY_CARE_PROVIDER_SITE_OTHER): Payer: Self-pay | Admitting: Gastroenterology

## 2024-03-04 NOTE — Telephone Encounter (Signed)
 Pt called in and states she had missed a call from us . Looked in chart and I do not see any missed calls. Jenette Mitchell spoke with pt on 5/2 about CT results. Pt states the missed call was from 5/1. Advised pt that she had spoke with Jenette Mitchell on 5/2 but pt did not remember. Went over CT results again with pt. Also explained how to take Bentyl . Pt had been taking Bentyl  after meds. Reminded pt of June appt with provider

## 2024-03-05 ENCOUNTER — Other Ambulatory Visit: Payer: Self-pay | Admitting: Internal Medicine

## 2024-03-05 DIAGNOSIS — J449 Chronic obstructive pulmonary disease, unspecified: Secondary | ICD-10-CM

## 2024-03-05 MED ORDER — PROMETHAZINE-DM 6.25-15 MG/5ML PO SYRP: 5.0000 mL | ORAL_SOLUTION | Freq: Four times a day (QID) | ORAL | 0 refills | Status: DC | PRN

## 2024-03-05 MED ORDER — ALBUTEROL SULFATE HFA 108 (90 BASE) MCG/ACT IN AERS: 2.0000 | INHALATION_SPRAY | RESPIRATORY_TRACT | 5 refills | Status: AC | PRN

## 2024-03-05 MED ORDER — LEVOCETIRIZINE DIHYDROCHLORIDE 5 MG PO TABS: 5.0000 mg | ORAL_TABLET | Freq: Every evening | ORAL | 5 refills | Status: DC

## 2024-03-05 NOTE — Telephone Encounter (Signed)
 I was able to inform pt of the following information per PCP instructions as follows " Xyzal  for allergies and promethazine  cough syrup were emailed to the pharmacy Use Ventolin  inhaler at least 4 times a day.  Office visits if not better.  Thanks"  Pt has stated understanding.

## 2024-03-05 NOTE — Telephone Encounter (Signed)
 Xyzal  for allergies and promethazine  cough syrup were emailed to the pharmacy Use Ventolin  inhaler at least 4 times a day.  Office visits if not better.  Thanks

## 2024-03-12 NOTE — Telephone Encounter (Signed)
 Patient informed by CMA Hildegard Low and this has been documented in her results

## 2024-03-29 NOTE — Progress Notes (Unsigned)
 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 Melody [name]  is a 68 y.o. female, with history of bronchiectasis, depression, GERD, hypothyroidism, MAI came to hospital with complaint of shortness of breath, cough.  Patient was recently discharged from the hospital on 10/02/2018.  Patient says that she has experienced worsening shortness of breath, coughing up phlegm.    In the ED chest x-ray showed diffusely increased interstitial opacity with mild nodularity on the right suspect for acute interstitial inflammatory infectious process superimposed on underlying chronic changes Patient started on IV Levaquin .   Brief Admission Hx: 68 y.o. female, with history of bronchiectasis, depression, GERD, hypothyroidism, MAI came to hospital with complaint of shortness of breath, cough.   MDM/Assessment & Plan:    Healthcare associated pneumonia-given that she was hospitalized last month, seen on chest x-ray, patient started on IV Levaquin .  She is clinically improving and feeling better, ambulating in the room. Continue levofloxacin .  She is on room air.    Blood culture no growth to date.  Influenza panel is negative   Bronchiectasis/COPD-treated with DuoNebs every 6 hours, Solu-Medrol  60 mg IV every 6 hours, Mucinex  1200 mg p.o. twice daily.  Add cough medication.  Discharged home on a  prednisone  taper.   Hypokalemia-repleted.    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  Because of difficulty with transportation, the patient has requested to follow-up and establish care with Dr. Zoila [name] so that she can be seen by a local pulmonologist.  She understands that he is retiring at the end of the year but another pulmonologist is going to be taking over that practice.   Hypothyroidism-continue Synthroid    6  Bipolar Disorder - Resumed home medications.     History of Present Illness  03/15/2020  Pulmonary/ 1st office eval/Melody [name] / Melody [name]  Chief Complaint  Patient presents with   Pulmonary Consult    Former pt of Dr Melody [name]. She c/o occ non prod cough.    Living in same house since around 2008 with neg allergy  profile for mold as above   Dyspnea:  MMRC2 = can't walk a nl pace on a flat grade s sob but does fine slow and flat   Cough: some worse p mowed grass one week prior to OV on Breo which "makes her cough" like something tickling her throat    Sleep: great p sleep med per Melody [name] s resp complaints SABA use: one daily/ rarely  rec Plan A = Automatic = Always=   Stop Breo (powder) Dulera  100 Take 2 puffs first thing in am and then another 2 puffs about 12 hours later.  Work on inhaler technique:  Plan B = Backup (to supplement plan A, not to replace it) Only use your albuterol  inhaler as a rescue medication Plan C = Crisis (  instead of Plan B but only if Plan B stops working) - only use your albuterol  nebulizer if you first try Plan B and it fails to help > ok to use the nebulizer up to every 4 hours but if start needing it regularly call for immediate appointment Please schedule a follow up visit in 3 months but call sooner if needed - please bring inhalers and nebulizer solution    06/20/2020  f/u ov/Melody [name] office/Melody [name] re:  Copd I/ bronchiectasis better on dulera   100 2bid / had covid vaccines x 2 / did not bring inhalers Chief Complaint   Patient presents with   Follow-up    Pt c/o cough with green sputum off and on for the past few months.    Dyspnea:  MMRC2 = can't walk a nl pace on a flat grade s sob but does fine slow and flat  Cough: variably purulent esp in am / does not have prn abx Sleeping: flat in bed/ on one pillow  SABA use: hasn't needed at all "you told me to stop these"  (note Plan B / C above)  02: none  rec For nasty mucus  >  zpak  Plan A = Automatic = Always=    dulera  100 Take 2 puffs first thing in am and then another 2 puffs about 12 hours later.  Plan B = Backup (to supplement plan A, not to replace it) Only use your albuterol  inhaler as a rescue medication   Plan C = Crisis (instead of Plan B but only if Plan B stops working) - only use your albuterol  nebulizer if you first try Plan B and it fails to help > ok to use the nebulizer up to every 4 hours      08/31/2020  f/u ov/Melody [name] office/Melody [name] re: had flare of green mucus   07/12/20 finished zpak then levaquin  cleared the mucus and cough with neg covd testing 08/12/20 but asked to come in to regroup re longterm rx  Chief Complaint  Patient presents with   Follow-up    sore throat that started this morning  Dyspnea:  No change doe  Cough: none now  Sleeping: ok  SABA use: dulera  100 2bid / none extra  02: no rec Salt water  warm gargles  If worse ok to use magic mouthwash swish and swallow four times daily  As long as you have cough or sorethroat add pepcid 20 mg (famotidine) after supper  For nasty mucus >   Levaquin  500 mg daily x 7 days  We will get you a new nebulzer  Keep your previous appt     01/18/2021  f/u ov/[place] office/Melody [name] re: COPD GOLD I/ bronchiecatasis / poor insight into meds on dulera  100/ flutter valve Chief Complaint  Patient presents with   Follow-up    Breathing is "ok". She states that she has been having palpitations "for a while". She states that her "chest feels raw" x 3 days- neb tx helps but she is not  sure what it is. Her cough is prod with clear sputum.   Dyspnea:  MMRC1 = can walk nl pace, flat grade, can't hurry or go uphills or steps s sob  Cough: more freq x one month but  not worse in am / clear mucus only, no change p took levaquin  which she was not supposed so use s change in sputum color/ assoc with nasal congestion which is much worse than usual despite flonase /xyzal /dymista  Sleeping: flat bed / one pillow  SABA use: once a month/ used neb night prior to OV   02: none  Covid status: vax x 2 last 05/02/20  Rec For nasty mucus >   Levaquin  500 mg daily x 7 days For cough/ congestion > mucinex  dm 1200 mg every 12 hours as needed with flutter valve as much as possible  For thrush > clotrimazole  troche 10 mg up to 5 x daily  After using dulera , try arm and hammer toothpaste, make a slurry/ gargle  I will be referring you to a sinus specialist for your nasal congestion > did not see one in Epic or care everywhere database.    Please schedule a follow up visit in 12  months but call sooner if needed>did not do      02/18/2024  Re-establish  ov/[place] office/Shawndra Clute re: bronchiectasis  maint on spiriva  and prn saba   Chief Complaint  Patient presents with   Establish Care   COPD   Dyspnea:  Not limited by breathing from desired activities  / more by back pain  Cough: worse since cold weather > min trace pink mucus   Sleeping: bed is flat/ 2 pillows s noct or early am    resp cc  SABA use: none  02: none  Rec    04/01/2024  f/u ov/[place] office/Taevion Sikora re: *** maint on ***  No chief complaint on file.   Dyspnea:  *** Cough: *** Sleeping: ***   resp cc  SABA use: *** 02: ***  Lung cancer screening: ***   No obvious day to day or daytime variability or assoc excess/ purulent sputum or mucus plugs or hemoptysis or cp or chest tightness, subjective wheeze or overt sinus or hb symptoms.    Also denies any obvious fluctuation of symptoms with weather or environmental changes  or other aggravating or alleviating factors except as outlined above   No unusual exposure hx or h/o childhood pna/ asthma or knowledge of premature birth.  Current Allergies, Complete Past Medical History, Past Surgical History, Family History, and Social History were reviewed in [employer] record.  ROS  The following are not active complaints unless bolded Hoarseness, sore throat, dysphagia, dental problems, itching, sneezing,  nasal congestion or discharge of excess mucus or purulent secretions, ear ache,   fever, chills, sweats, unintended wt loss or wt gain, classically pleuritic or exertional cp,  orthopnea pnd or arm/hand swelling  or leg swelling, presyncope, palpitations, abdominal pain, anorexia, nausea, vomiting, diarrhea  or change in bowel habits or change in bladder habits, change in stools or change in urine, dysuria, hematuria,  rash, arthralgias, visual complaints, headache, numbness, weakness or ataxia or problems with walking or coordination,  change in mood or  memory.        No outpatient medications have been marked as taking for the 04/01/24 encounter (Appointment) with Diamond Formica, MD.                Past Medical History:  Diagnosis Date   Anxiety    Bronchiectasis (HCC)    Chronic back pain    Chronic neck pain    Depression    bipolar- Dr Melody [name]   Fatigue    Fibromyalgia    GERD (gastroesophageal reflux disease)    Headache    Hypothyroidism    Low back pain    Dr Larrie Po   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[facility])    Osteoarthritis    Pneumonia    Pulmonary nodule  Sinus congestion    Thyroid  disease    hypothyroidsm   Vertigo    Vitamin B12 deficiency        Objective:    Wts  02/18/2024        126  01/18/2021       131 08/31/2020       133  06/20/20 129 lb (58.5 kg)  03/31/20 129 lb (58.5 kg)  03/15/20 130 lb (59 kg)    Vital signs reviewed  04/01/2024  - Note at rest 02 sats  ***% on ***   General  appearance:    ***      Min barr***      Assessment

## 2024-04-01 ENCOUNTER — Encounter: Payer: Self-pay | Admitting: Internal Medicine

## 2024-04-01 ENCOUNTER — Ambulatory Visit (INDEPENDENT_AMBULATORY_CARE_PROVIDER_SITE_OTHER): Admitting: Internal Medicine

## 2024-04-01 VITALS — BP 116/73 | HR 85 | Ht 66.0 in | Wt 126.2 lb

## 2024-04-01 DIAGNOSIS — R918 Other nonspecific abnormal finding of lung field: Secondary | ICD-10-CM | POA: Diagnosis not present

## 2024-04-01 DIAGNOSIS — Z87891 Personal history of nicotine dependence: Secondary | ICD-10-CM

## 2024-04-01 DIAGNOSIS — J449 Chronic obstructive pulmonary disease, unspecified: Secondary | ICD-10-CM

## 2024-04-01 MED ORDER — LEVOFLOXACIN 500 MG PO TABS: 500.0000 mg | ORAL_TABLET | Freq: Every day | ORAL | 11 refills | Status: DC

## 2024-04-01 NOTE — Assessment & Plan Note (Addendum)
 Quit smoking 2012  - PFT's with GOLD 1 criteria 04/2017  - 03/15/2020  Very poor hfa but cough / throat irritation from BREO > try dulera  100 2bid   > improved  - 01/18/2021  After extensive coaching inhaler device,  effectiveness =    90%  Vaccinated - Prevnar 20, RSV, flu Recurrent thrush w/Dulera  - d/c 08/2022 Rx Spiriva  respimat qd and Albuterol  prn  Pt is Group B in terms of symptom/risk and laba/lama therefore appropriate rx at this point >>>  spiriva  2.5 one - two puffs each am adequate here    04/01/2024  After extensive coaching inhaler device,  effectiveness =    75% with hfa and 90% with [facility]

## 2024-04-01 NOTE — Patient Instructions (Addendum)
 Please Dr [name] about your side effects  For nasty mucus >   Levaquin  500 mg daily x 7 days  For cough/ congestion > mucinex  dm 1200 mg every 12 hours as needed with flutter valve as much as possible   Continue spiriva  1 or 2 puffs each am (as maintenance)   Only use your albuterol  as a rescue medication to be used if you can't catch your breath by resting or doing a relaxed purse lip breathing pattern.  - The less you use it, the better it will work when you need it. - Ok to use up to 2 puffs  every 4 hours if you must but call for immediate appointment if use goes up over your usual need - Don't leave home without it !!  (think of it like the spare tire for your car)    If not doing well on this combination we can refer you to Dr Marygrace Snellen in the [place]

## 2024-04-01 NOTE — Assessment & Plan Note (Signed)
 See CT chest 07/22/17 -  08/18/17  MAI isolated from sputum, sensitive to Biaxin  > 06/20/2020 rec zpak prn purulent sputum  -   08/13/20 reported refractory green mucus so changed to levaquin  and cleared it up  -  rec for flares rx levaquin  500 x 7 days 02/25/2024 >>> repeated 04/01/2024   Discussed again that bronchiectasis is not curable but can be managed with approp rx to keep mucus thin and if flare of purulent sputum then levaquin  500 x 7 days would be reasonable choice  She desired f/u in [place] so referred her to Dr Marygrace Snellen prn if this combo does no keep symptoms in check          Each maintenance medication was reviewed in detail including emphasizing most importantly the difference between maintenance and prns and under what circumstances the prns are to be triggered using an action plan format where appropriate.  Total time for H and P, chart review, counseling, reviewing hfa/ smi  device(s) and generating customized AVS unique to this office visit / same day charting = 33 min

## 2024-04-06 ENCOUNTER — Other Ambulatory Visit: Payer: Self-pay | Admitting: Internal Medicine

## 2024-04-16 ENCOUNTER — Ambulatory Visit: Admitting: Obstetrics & Gynecology

## 2024-04-21 ENCOUNTER — Encounter (INDEPENDENT_AMBULATORY_CARE_PROVIDER_SITE_OTHER): Payer: Self-pay | Admitting: Gastroenterology

## 2024-04-21 ENCOUNTER — Ambulatory Visit (INDEPENDENT_AMBULATORY_CARE_PROVIDER_SITE_OTHER): Admitting: Gastroenterology

## 2024-04-21 VITALS — BP 131/68 | HR 75 | Temp 98.7°F | Ht 66.0 in | Wt 127.7 lb

## 2024-04-21 DIAGNOSIS — K5900 Constipation, unspecified: Secondary | ICD-10-CM | POA: Diagnosis not present

## 2024-04-21 DIAGNOSIS — R1030 Lower abdominal pain, unspecified: Secondary | ICD-10-CM | POA: Diagnosis not present

## 2024-04-21 MED ORDER — POLYETHYLENE GLYCOL 3350 17 GM/SCOOP PO POWD
ORAL | 2 refills | Status: AC
Start: 2024-04-21 — End: 2024-09-02

## 2024-04-21 MED ORDER — PSYLLIUM 58.6 % PO PACK: 1.0000 | PACK | Freq: Two times a day (BID) | ORAL | 3 refills | Status: AC

## 2024-04-21 NOTE — Progress Notes (Addendum)
 Referring Provider: Garald Karlynn GAILS, MD Primary Care Physician:  Garald Karlynn GAILS, MD Primary GI Physician:   Chief Complaint  Patient presents with   Follow-up    Patient here today for a follow up. She says she has little to no appetite most morning and will eat something small by the evening she is starving. She says when she eats she has severe abdominal pain on the Left lower side. She says this feels like labor pains. She is having bloating issues also.     HPI:   Melody [name] is a 68 y.o. female with past medical history of anxiety, depression, fibromyalgia, GERD, hypothyroidism, respiratory MAI   Patient presenting today for:  Follow up of abdominal pain and constipation   Last seen, April, at that time having abdominal pain, recent UTI. No BM in a few days. Taking protonix  40mg  BID, some GERD symptoms depending what she eats  Patient recommended to continue dicyclomine  20mg  TID and schedule CT angio A/P w wo contrast  CT Angio A/P w wo: 02/26/24: The abdominal aorta is normal in caliber. Scattered atherosclerotic changes are present. The celiac artery and SMA are patent. The renal arteries and IMA are patent. The common iliac arteries, internal and external iliac arteries, common femoral arteries and visualized portions of the superficial and deep femoral arteries appear patent.  Recent T4 1.04, CMP and CBC grossly unremarkable in may   Present:  Continues to have some lower abdominal pain, especially with eating certain foods such as pizza. She notes that it feels sometimes like labor pains. Endorses improvement in pain after defecation but not having a BM very frequently. She has to sit and strain at times to go to the restroom. She is taking lactulose  PRN which helps. She reports generally she does not have a BM unless she takes the lactulose . No rectal bleeding or melena. Still having a lot of urinary frequency. She has tried colace in the past without  improvement. Tries to drink a lot of water  as she has a dry mouth. She is unsure if she is taking bentyl  or not. She takes percocet for back pain, takes only as needed for severe pain, maybe once per week if that.    CT A/P without contrast: 12/2023 Nonobstructive left renal calculus. No definite ureteral calculus obstructive uropathy. Evaluation of the mid to distal ureters is limited due to overlapping structures and vascular calcifications. 2. Pelvic floor laxity with small cystocele. 3. Bronchiectasis with multifocal mucous impaction. Scattered pulmonary nodules are noted at the lung bases measuring up to 1.6 cm in the left lower lobe. While findings may be related to infectious or inflammatory process, the possibility of underlying malignancy can not be excluded. 4. Aortic atherosclerosis. Last Colonoscopy:09/2022 - One 2 mm polyp in the transverse colon (lymph node)                           - Diverticulosis in the sigmoid colon, in the                            descending colon, in the transverse colon and in                            the ascending colon. normal colon biopsied-normal                           -  Non-bleeding internal hemorrhoids.   Last Endoscopy: 09/2022 - No endoscopic esophageal abnormality to explain                            patient's dysphagia. Esophagus dilated. Dilated.                           - 2 cm hiatal hernia.                           - Normal stomach. Biopsied-normal                            - Normal examined duodenum. Biopsied-normal    Recommendations:  Repeat TCS 10 years   Past Medical History:  Diagnosis Date   Anxiety    Blood dyscrasia    thrombocytopenia   Bronchiectasis (HCC)    Chronic back pain    Chronic neck pain    COPD (chronic obstructive pulmonary disease) (HCC)    Depression    bipolar- Dr Vincente   Fibromyalgia    GERD (gastroesophageal reflux disease)    Headache    Migraine,   History of hiatal hernia     Hypertension    Hypothyroidism    Low back pain    Dr Mavis   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[facility])    Osteoarthritis    Pneumonia    Pulmonary nodule    Vertigo    Vitamin B12 deficiency     Past Surgical History:  Procedure Laterality Date   ABDOMINAL HYSTERECTOMY N/A 01/10/2016   Procedure: HYSTERECTOMY ABDOMINAL;  Surgeon: Bobie FORBES Cathlyn JAYSON Nikki, MD;  Location: [facility];  Service: Gynecology;  Laterality: N/A;   ABDOMINAL SACROCOLPOPEXY N/A 01/10/2016   Procedure: ABDOMINO SACROCOLPOPEXY ;  Surgeon: Bobie FORBES Cathlyn JAYSON Nikki, MD;  Location: [facility];  Service: Gynecology;  Laterality: N/A;   ANTERIOR AND POSTERIOR REPAIR N/A 01/10/2016   Procedure:  POSTERIOR REPAIR (RECTOCELE);  Surgeon: Bobie FORBES Cathlyn JAYSON Nikki, MD;  Location: [facility];  Service: Gynecology;  Laterality: N/A;   BIOPSY  10/19/2022   Procedure: BIOPSY;  Surgeon: Eartha Angelia Sieving, MD;  [facility]: [facility];  Service: Gastroenterology;;   BLADDER SUSPENSION N/A 01/10/2016   Procedure: TRANSVAGINAL TAPE (TVT) PROCEDURE exact midurethral sling;  Surgeon: Bobie FORBES Cathlyn JAYSON Nikki, MD;  Location: [facility];  Service: Gynecology;  Laterality: N/A;   CERVICAL LAMINECTOMY  2001 and 2005   Dr Mavis    ACDF  C5-6, C6-7   CHOLECYSTECTOMY N/A 09/23/2023   Procedure: LAPAROSCOPIC CHOLECYSTECTOMY;  Surgeon: Signe Mitzie LABOR, MD;  Location: [facility];  Service: General;  Laterality: N/A;   COLONOSCOPY WITH PROPOFOL  N/A 10/19/2022   Procedure: COLONOSCOPY WITH PROPOFOL ;  Surgeon: Eartha Angelia Sieving, MD;  [facility];  Service: Gastroenterology;  Laterality: N/A;  10:30am, asa 1-2   CYSTO N/A 01/10/2016   Procedure: CYSTOSCOPY;  Surgeon: Bobie FORBES Cathlyn JAYSON Nikki, MD;  Location: [facility];  Service: Gynecology;  Laterality: N/A;   ESOPHAGEAL DILATION N/A 10/19/2022   Procedure: ESOPHAGEAL DILATION;  Surgeon: Eartha Angelia Sieving, MD;  [facility];  Service: Gastroenterology;   Laterality: N/A;   ESOPHAGOGASTRODUODENOSCOPY (EGD) WITH PROPOFOL  N/A 10/19/2022   Procedure: ESOPHAGOGASTRODUODENOSCOPY (EGD) WITH PROPOFOL ;  Surgeon: Eartha Angelia Sieving, MD;  [facility];  Service:  Gastroenterology;  Laterality: N/A;   POLYPECTOMY  10/19/2022   Procedure: POLYPECTOMY INTESTINAL;  Surgeon: Eartha Angelia Sieving, MD;  [facility];  Service: Gastroenterology;;   SALPINGOOPHORECTOMY Bilateral 01/10/2016   Procedure: BILATERAL SALPINGO OOPHORECTOMY;  Surgeon: Bobie FORBES Cathlyn JAYSON Nikki, MD;  Location: [facility];  Service: Gynecology;  Laterality: Bilateral;   SAVORY DILATION  10/19/2022   Procedure: SAVORY DILATION;  Surgeon: Eartha Angelia, Sieving, MD;  [facility];  Service: Gastroenterology;;   TUBAL LIGATION  10/29/1978    Current Outpatient Medications  Medication Sig Dispense Refill   albuterol  (VENTOLIN  HFA) 108 (90 Base) MCG/ACT inhaler Inhale 2 puffs into the lungs every 4 (four) hours as needed for wheezing or shortness of breath. 8.5 g 5   ALPRAZolam  (XANAX ) 1 MG tablet Take 1 mg by mouth 3 (three) times daily.     cyanocobalamin  (VITAMIN B12) 1000 MCG/ML injection INJECT 1ML INTO THE SKIN EVERY 14 DAYS 6 mL 3   diclofenac  Sodium (VOLTAREN ) 1 % GEL APPLY TWO GRAMS TO AFFECTED AREA(S) FOUR TIMES DAILY 200 g 3   fluticasone  (FLONASE ) 50 MCG/ACT nasal spray INSTILL TWO SPRAYS IN EACH NOSTRIL DAILY 48 g 3   Lactulose  20 GM/30ML SOLN Take 30-60 mLs (20-40 g total) by mouth 2 (two) times daily as needed (severe constipation). 450 mL 3   levocetirizine (XYZAL ) 5 MG tablet TAKE 1 TABLET BY MOUTH EVERY EVENING (FOR ALLERGY ) 90 tablet 3   levothyroxine  (SYNTHROID ) 50 MCG tablet TAKE 1 TABLET BY MOUTH EVERY MORNING 90 tablet 3   losartan  (COZAAR ) 25 MG tablet TAKE 1 TABLET BY MOUTH DAILY 30 tablet 5   methylphenidate  (RITALIN ) 20 MG tablet Take 1 tablet (20 mg total) by mouth daily.  0   oxyCODONE -acetaminophen  (PERCOCET/ROXICET)  5-325 MG tablet TAKE 1 TABLET BY MOUTH EVERY 8 HOURS AS NEEDED FOR SEVERE pain 60 tablet 0   pantoprazole  (PROTONIX ) 40 MG tablet TAKE 1 TABLET BY MOUTH TWICE DAILY 60 tablet 5   polyethylene glycol powder (GLYCOLAX /MIRALAX ) 17 GM/SCOOP powder Take 17 g by mouth 2 (two) times daily for 14 days, THEN 17 g daily. 850 g 2   psyllium (METAMUCIL) 58.6 % packet Take 1 packet by mouth 2 (two) times daily. 60 each 3   QUEtiapine  (SEROQUEL ) 25 MG tablet Take 25 mg by mouth at bedtime.     SPIRIVA  RESPIMAT 2.5 MCG/ACT AERS INHALE TWO PUFFS BY MOUTH EVERY DAY 4 g 11   clotrimazole  (MYCELEX ) 10 MG troche Take 1 tablet (10 mg total) by mouth 5 (five) times daily. (Patient not taking: Reported on 04/21/2024) 30 Troche 11   clotrimazole -betamethasone  (LOTRISONE ) cream Apply 1 Application topically daily. 30 g 0   dicyclomine  (BENTYL ) 20 MG tablet TAKE 1 TABLET BY MOUTH THREE TIMES DAILY BEFORE MEALS 90 tablet 1   No current facility-administered medications for this visit.    Allergies as of 04/21/2024 - Review Complete 04/21/2024  Allergen Reaction Noted   Flagyl  [metronidazole ] Shortness Of Breath 08/06/2018   Doxycycline hyclate Itching    Dulera  [mometasone  furo-formoterol fum]  08/29/2022   Gabapentin   11/12/2017   Nabumetone Other (See Comments) 09/01/2010   Sulfa antibiotics Other (See Comments) 11/08/2013   Tetracycline hcl Itching    Tramadol  hcl Other (See Comments) 03/18/2023    Social History   Socioeconomic History   Marital status: Legally Separated    Spouse name: Not on file   Number of children: Not on file   Years of education: Not on  file   Highest education level: Not on file  Occupational History   Not on file  Tobacco Use   Smoking status: Former    Current packs/day: 0.00    Average packs/day: 1 pack/day for 48.5 years (48.5 ttl pk-yrs)    Types: Cigarettes    Start date: 11/21/1962    Quit date: 05/06/2011    Years since quitting: 12.9   Smokeless tobacco: Never    Tobacco comments:    stopped periodically - peak rate of 1 ppd  Vaping Use   Vaping status: Never Used  Substance and Sexual Activity   Alcohol use: No    Alcohol/week: 0.0 standard drinks of alcohol   Drug use: No   Sexual activity: Not Currently    Birth control/protection: Post-menopausal, Surgical    Comment: Tubal/Hyst/BSO  Other Topics Concern   Not on file  Social History Narrative   [facility] (04/04/17):   Originally from [place], [place]. 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 Recently had a persian cat that passed. No bird exposure. She denies any indoor plants currently. She does use wood burning for heat. She reports there is water  getting under her foundation. She has mold in her home that she reports is in the 2nd floor of her home. She has recently started seeing mold under her bed. She reports her home smells musty & damp. She has always lived in [place].    Social Drivers of Health   Financial Resource Strain: High Risk (01/16/2024)   Overall Financial Resource Strain (CARDIA)    Difficulty of Paying Living Expenses: Hard  Food Insecurity: Food Insecurity Present (01/16/2024)   Hunger Vital Sign    Worried About Running Out of Food in the Last Year: Never true    Ran Out of Food in the Last Year: Sometimes true  Transportation Needs: No Transportation Needs (01/16/2024)   PRAPARE - Administrator, Civil Service (Medical): No    Lack of Transportation (Non-Medical): No  Physical Activity: Insufficiently Active (01/16/2024)   Exercise Vital Sign    Days of Exercise per Week: 1 day    Minutes of Exercise per Session: 10 min  Stress: Stress Concern Present (01/16/2024)   [employer] of Occupational Health - Occupational Stress Questionnaire    Feeling of Stress : Very much  Social Connections: Moderately Isolated (01/16/2024)   Social Connection and Isolation Panel    Frequency of Communication with  Friends and Family: More than three times a week    Frequency of Social Gatherings with Friends and Family: Three times a week    Attends Religious Services: More than 4 times per year    Active Member of Clubs or Organizations: No    Attends Banker Meetings: Never    Marital Status: Separated    Review of systems General: negative for malaise, night sweats, fever, chills, weight loss Neck: Negative for lumps, goiter, pain and significant neck swelling Resp: Negative for cough, wheezing, dyspnea at rest CV: Negative for chest pain, leg swelling, palpitations, orthopnea GI: denies melena, hematochezia, nausea, vomiting, diarrhea, dysphagia, odyonophagia, early satiety or unintentional weight loss. +constipation +abdominal pain  MSK: Negative for joint pain or swelling, back pain, and muscle pain. Derm: Negative for itching or rash Psych: Denies depression, anxiety, memory loss, confusion. No homicidal or suicidal ideation.  The remainder of the review of systems is noncontributory.  Physical Exam: BP 131/68 (  BP Location: Left Arm, Patient Position: Sitting, Cuff Size: Normal)   Pulse 75   Temp 98.7 F (37.1 C) (Temporal)   Ht 5' 6 (1.676 m)   Wt 127 lb 11.2 oz (57.9 kg)   LMP 10/29/2006 (Approximate)   BMI 20.61 kg/m  General:   Alert and oriented. No distress noted. Pleasant and cooperative.  Head:  Normocephalic and atraumatic. Eyes:  Conjuctiva clear without scleral icterus. Mouth:  Oral mucosa pink and moist. Good dentition. No lesions. Heart: Normal rate and rhythm, s1 and s2 heart sounds present.  Lungs: Clear lung sounds in all lobes. Respirations equal and unlabored. Abdomen:  +BS, soft, non-tender and non-distended. No rebound or guarding. No HSM or masses noted. Derm: No palmar erythema or jaundice Msk:  Symmetrical without gross deformities. Normal posture. Extremities:  Without edema. Neurologic:  Alert and  oriented x4 Psych:  Alert and cooperative.  Normal mood and affect.  Invalid input(s): 6 MONTHS   ASSESSMENT: Melody [name] is a 68 y.o. female presenting today for follow up of abdominal pain and constipation  Patient with ongoing history of abdominal pain, alternating bowel habits. She has undergone relatively recent colonoscopy in 2023 as outlined above, CT Angio after last visit was without acute findings or mesenteric ischemia She continues to endorse lower abdominal pain, typically worse postprandially, but especially worsened by certain foods. Notably with significant constipation, having a BM maybe twice per week only when taking lactulose , I suspect this is contributing to her abdominal pain, likely a component of IBS going on as well. She does endorse a lot of stress at home. Reports only taking opiate pain meds on occasion but this could also be contributing to her constipation, which we discussed. She is unsure if she is taking bentyl  or not. No rectal bleeding or melena. Weight is stable. At this time I recommend she take bentyl  20mg  PRN if she is not already, utilize low FODMAP diet, start miralax  and metamucil as she needs a good bowel regimen.    PLAN:  -take bentyl  20mg  TID PRN -Increase water  intake, aim for atleast 64 oz per day -Increase fruits, veggies and whole grains, kiwi and prunes are especially good for constipation -start miralax  1 capful BID x2 weeks -start metamucil BID   All questions were answered, patient verbalized understanding and is in agreement with plan as outlined above.   Follow Up: 3 months  Erleen Egner L. Mariette, MSN, APRN, AGNP-C Adult-Gerontology Nurse Practitioner [facility]  I have reviewed the note and agree with the APP's assessment as described in this progress note  Toribio Fortune, MD Gastroenterology and Hepatology [facility] Gastroenterology

## 2024-04-21 NOTE — Patient Instructions (Signed)
 Increase water  intake, aim for atleast 64 oz per day Increase fruits, veggies and whole grains, kiwi and prunes are especially good for constipation If you are not taking it already, you can take the bentyl /dicyclomine  20mg  that I sent at last visit, up to three times per day as needed for abdominal pain As discussed, I will try to send these to your pharmacy, if they do not cover it, please buy these over the counter, generic is fine Start miralax  1 capful twice daily x2 weeks, then reduce to 1 capful daily Start metamucil twice daily and continue this I am also providing the low FODMAP guide which can help with IBS  Follow up 3 months  It was a pleasure to see you today. I want to create trusting relationships with patients and provide genuine, compassionate, and quality care. I truly value your feedback! please be on the lookout for a survey regarding your visit with me today. I appreciate your input about our visit and your time in completing this!    Fadia Marlar L. Azhar Knope, MSN, APRN, AGNP-C Adult-Gerontology Nurse Practitioner [facility] Gastroenterology at [facility]

## 2024-04-22 ENCOUNTER — Other Ambulatory Visit (INDEPENDENT_AMBULATORY_CARE_PROVIDER_SITE_OTHER): Payer: Self-pay | Admitting: Gastroenterology

## 2024-04-23 ENCOUNTER — Ambulatory Visit (INDEPENDENT_AMBULATORY_CARE_PROVIDER_SITE_OTHER): Admitting: Internal Medicine

## 2024-04-23 ENCOUNTER — Encounter: Payer: Self-pay | Admitting: Internal Medicine

## 2024-04-23 VITALS — BP 108/78 | HR 77 | Temp 98.0°F | Ht 66.0 in | Wt 126.0 lb

## 2024-04-23 DIAGNOSIS — F419 Anxiety disorder, unspecified: Secondary | ICD-10-CM

## 2024-04-23 DIAGNOSIS — F319 Bipolar disorder, unspecified: Secondary | ICD-10-CM | POA: Diagnosis not present

## 2024-04-23 DIAGNOSIS — E538 Deficiency of other specified B group vitamins: Secondary | ICD-10-CM

## 2024-04-23 DIAGNOSIS — E034 Atrophy of thyroid (acquired): Secondary | ICD-10-CM | POA: Diagnosis not present

## 2024-04-23 DIAGNOSIS — M5440 Lumbago with sciatica, unspecified side: Secondary | ICD-10-CM | POA: Diagnosis not present

## 2024-04-23 DIAGNOSIS — G8929 Other chronic pain: Secondary | ICD-10-CM | POA: Diagnosis not present

## 2024-04-23 MED ORDER — OXYCODONE-ACETAMINOPHEN 5-325 MG PO TABS: ORAL_TABLET | ORAL | 0 refills | Status: DC

## 2024-04-23 NOTE — Progress Notes (Signed)
 Subjective:  Patient ID: Melody [name], female    DOB: June 24, 1956  Age: 68 y.o. MRN: [id number]  CC: No chief complaint on file.   HPI Melody [name] presents for depression, LBP, anxiety C/o stress  Outpatient Medications Prior to Visit  Medication Sig Dispense Refill   albuterol  (VENTOLIN  HFA) 108 (90 Base) MCG/ACT inhaler Inhale 2 puffs into the lungs every 4 (four) hours as needed for wheezing or shortness of breath. 8.5 g 5   ALPRAZolam  (XANAX ) 1 MG tablet Take 1 mg by mouth 3 (three) times daily.     clotrimazole  (MYCELEX ) 10 MG troche Take 1 tablet (10 mg total) by mouth 5 (five) times daily. (Patient not taking: Reported on 04/21/2024) 30 Troche 11   clotrimazole -betamethasone  (LOTRISONE ) cream Apply 1 Application topically daily. 30 g 0   cyanocobalamin  (VITAMIN B12) 1000 MCG/ML injection INJECT 1ML INTO THE SKIN EVERY 14 DAYS 6 mL 3   diclofenac  Sodium (VOLTAREN ) 1 % GEL APPLY TWO GRAMS TO AFFECTED AREA(S) FOUR TIMES DAILY 200 g 3   dicyclomine  (BENTYL ) 20 MG tablet TAKE 1 TABLET BY MOUTH THREE TIMES DAILY BEFORE MEALS 90 tablet 1   fluticasone  (FLONASE ) 50 MCG/ACT nasal spray INSTILL TWO SPRAYS IN EACH NOSTRIL DAILY 48 g 3   Lactulose  20 GM/30ML SOLN Take 30-60 mLs (20-40 g total) by mouth 2 (two) times daily as needed (severe constipation). 450 mL 3   levocetirizine (XYZAL ) 5 MG tablet TAKE 1 TABLET BY MOUTH EVERY EVENING (FOR ALLERGY ) 90 tablet 3   levothyroxine  (SYNTHROID ) 50 MCG tablet TAKE 1 TABLET BY MOUTH EVERY MORNING 90 tablet 3   losartan  (COZAAR ) 25 MG tablet TAKE 1 TABLET BY MOUTH DAILY 30 tablet 5   methylphenidate  (RITALIN ) 20 MG tablet Take 1 tablet (20 mg total) by mouth daily.  0   pantoprazole  (PROTONIX ) 40 MG tablet TAKE 1 TABLET BY MOUTH TWICE DAILY 60 tablet 5   polyethylene glycol powder (GLYCOLAX /MIRALAX ) 17 GM/SCOOP powder Take 17 g by mouth 2 (two) times daily for 14 days, THEN 17 g daily. 850 g 2   psyllium (METAMUCIL) 58.6 % packet Take 1 packet by  mouth 2 (two) times daily. 60 each 3   QUEtiapine  (SEROQUEL ) 25 MG tablet Take 25 mg by mouth at bedtime.     SPIRIVA  RESPIMAT 2.5 MCG/ACT AERS INHALE TWO PUFFS BY MOUTH EVERY DAY 4 g 11   oxyCODONE -acetaminophen  (PERCOCET/ROXICET) 5-325 MG tablet TAKE 1 TABLET BY MOUTH EVERY 8 HOURS AS NEEDED FOR SEVERE pain 60 tablet 0   No facility-administered medications prior to visit.    ROS: Review of Systems  Constitutional:  Positive for fatigue. Negative for activity change, appetite change, chills and unexpected weight change.  HENT:  Negative for congestion, mouth sores and sinus pressure.   Eyes:  Negative for visual disturbance.  Respiratory:  Negative for cough and chest tightness.   Gastrointestinal:  Negative for abdominal pain and nausea.  Genitourinary:  Negative for difficulty urinating, frequency and vaginal pain.  Musculoskeletal:  Positive for arthralgias and back pain. Negative for gait problem.  Skin:  Negative for pallor and rash.  Neurological:  Negative for dizziness, tremors, weakness, numbness and headaches.  Psychiatric/Behavioral:  Positive for decreased concentration, dysphoric mood and sleep disturbance. Negative for confusion and suicidal ideas. The patient is nervous/anxious.     Objective:  BP 108/78   Pulse 77   Temp 98 F (36.7 C) (Oral)   Ht 5' 6 (1.676 m)   Wt 126  lb (57.2 kg)   LMP 10/29/2006 (Approximate)   SpO2 94%   BMI 20.34 kg/m   BP Readings from Last 3 Encounters:  04/23/24 108/78  04/21/24 131/68  04/01/24 116/73    Wt Readings from Last 3 Encounters:  04/23/24 126 lb (57.2 kg)  04/21/24 127 lb 11.2 oz (57.9 kg)  04/01/24 126 lb 3.2 oz (57.2 kg)    Physical Exam Constitutional:      General: She is not in acute distress.    Appearance: Normal appearance. She is well-developed.  HENT:     Head: Normocephalic.     Right Ear: External ear normal.     Left Ear: External ear normal.     Nose: Nose normal.   Eyes:     General:         Right eye: No discharge.        Left eye: No discharge.     Conjunctiva/sclera: Conjunctivae normal.     Pupils: Pupils are equal, round, and reactive to [place].   Neck:     Thyroid : No thyromegaly.     Vascular: No JVD.     Trachea: No tracheal deviation.   Cardiovascular:     Rate and Rhythm: Normal rate and regular rhythm.     Heart sounds: Normal heart sounds.  Pulmonary:     Effort: No respiratory distress.     Breath sounds: No stridor. No wheezing.  Abdominal:     General: Bowel sounds are normal. There is no distension.     Palpations: Abdomen is soft. There is no mass.     Tenderness: There is no abdominal tenderness. There is no guarding or rebound.   Musculoskeletal:        General: Tenderness present.     Cervical back: Normal range of motion and neck supple. No rigidity.     Right lower leg: No edema.     Left lower leg: No edema.  Lymphadenopathy:     Cervical: No cervical adenopathy.   Skin:    Findings: No erythema or rash.   Neurological:     Cranial Nerves: No cranial nerve deficit.     Motor: No abnormal muscle tone.     Coordination: Coordination normal.     Deep Tendon Reflexes: Reflexes normal.   Psychiatric:        Behavior: Behavior normal.        Thought Content: Thought content normal.        Judgment: Judgment normal.    LS spine w/pain Dare is upset with her home situation, ongoing difficulties with her son who lives with her   Lab Results  Component Value Date   WBC 6.7 02/27/2024   HGB 12.9 02/27/2024   HCT 39.1 02/27/2024   PLT 167.0 02/27/2024   GLUCOSE 91 02/27/2024   CHOL 197 05/28/2022   TRIG 86.0 05/28/2022   HDL 59.90 05/28/2022   LDLCALC 120 (H) 05/28/2022   ALT 6 02/27/2024   AST 11 02/27/2024   NA 142 02/27/2024   K 3.6 02/27/2024   CL 106 02/27/2024   CREATININE 0.72 02/27/2024   BUN 11 02/27/2024   CO2 29 02/27/2024   TSH 2.40 08/23/2023   INR 1.0 04/03/2023    CT Angio Abd/Pel w/ and/or w/o Result  Date: 02/27/2024 EXAMINATION: CT ANGIO ABDOMEN PELVIS  W & WO CONTRAST CLINICAL INDICATION: Female, 68 years old. postprandial abdominal pain, weigth loss, rule out chronic mesenteric ischemia TECHNIQUE: Axial CTA of the abdomen and  pelvis with and without 100 cc Omnipaque  300 intravenous contrast. Multiplanar and 3D reformations provided. Unless otherwise specified, incidental thyroid , adrenal, renal lesions do not require dedicated imaging follow up. Additionally, any mentioned pulmonary nodules do not require dedicated imaging follow-up based on the Fleischner guidelines unless otherwise specified. Coronary calcifications are not identified unless otherwise specified. COMPARISON: 01/03/2024 FINDINGS: The abdominal aorta is normal in caliber. Scattered atherosclerotic changes are present. The celiac artery and SMA are patent. The renal arteries and IMA are patent. The common iliac arteries, internal and external iliac arteries, common femoral arteries and visualized portions of the superficial and deep femoral arteries appear patent. Bronchiectasis and tree-in-bud nodularity noted in the lung bases, right greater than left. The heart is normal in size. The liver appears normal. The gallbladder is surgically absent. The spleen is normal. The pancreas is normal. The adrenals are normal. The kidneys are normal. The urinary bladder is normal. The uterus is surgically absent. There is colonic diverticulosis. Large and small bowel loops are otherwise within normal limits. No free fluid or lymphadenopathy. There are degenerative changes of the spine and bony pelvis. IMPRESSION: Patent mesenteric arteries. 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 Short-term follow-up dedicated chest CT should be considered. DOSE REDUCTION: This exam was performed according to our departmental dose-optimization program which  includes automated exposure control, adjustment of the mA and/or kV according to patient size and/or use of iterative reconstruction technique. Electronically signed by: [place] Engel MD 02/27/2024 12:23 PM EDT RP Workstation: [id number]    Assessment & Plan:   Problem List Items Addressed This Visit     Hypothyroidism (Chronic)   Chronic On Levothroid      Vitamin B12 deficiency   Continue with vitamin B12      Bipolar I disorder (HCC) (Chronic)   Pt declined  Psychology consult F/u w/Dr Vincente      Anxiety disorder - Primary    Pt declined  Psychology consult F/u w/Dr Vincente      Low back pain   Recurrent/chronic pain Percocet prn  Potential benefits of a long term opioids use as well as potential risks (i.e. addiction risk, apnea etc) and complications (i.e. Somnolence, constipation and others) were explained to the patient and were aknowledged.  Potential benefits of a long term NSAID  use as well as potential risks  and complications were explained to the patient and were aknowledged.      Relevant Medications   oxyCODONE -acetaminophen  (PERCOCET/ROXICET) 5-325 MG tablet      Meds ordered this encounter  Medications   oxyCODONE -acetaminophen  (PERCOCET/ROXICET) 5-325 MG tablet    Sig: TAKE 1 TABLET BY MOUTH EVERY 8 HOURS AS NEEDED FOR SEVERE PAIN    Dispense:  60 tablet    Refill:  0    Please fill on or after 05/11/24      Follow-up: Return in about 3 months (around 07/24/2024) for a follow-up visit.  Marolyn Noel, MD

## 2024-04-23 NOTE — Assessment & Plan Note (Signed)
  Pt declined  Psychology consult F/u w/Dr Vincente

## 2024-04-23 NOTE — Assessment & Plan Note (Signed)
 Pt declined  Psychology consult F/u w/Dr Vincente

## 2024-04-23 NOTE — Assessment & Plan Note (Signed)
 Chronic On Levothroid -

## 2024-04-28 NOTE — Assessment & Plan Note (Signed)
 Recurrent/chronic pain Percocet prn  Potential benefits of a long term opioids use as well as potential risks (i.e. addiction risk, apnea etc) and complications (i.e. Somnolence, constipation and others) were explained to the patient and were aknowledged.  Potential benefits of a long term NSAID  use as well as potential risks  and complications were explained to the patient and were aknowledged.

## 2024-04-28 NOTE — Assessment & Plan Note (Signed)
Continue with vitamin B12 

## 2024-05-11 ENCOUNTER — Other Ambulatory Visit: Payer: Self-pay

## 2024-05-11 ENCOUNTER — Emergency Department (HOSPITAL_COMMUNITY)
Admission: EM | Admit: 2024-05-11 | Discharge: 2024-05-11 | Disposition: A | Discharge status: Home / Self Care | Attending: Emergency Medicine | Admitting: Emergency Medicine

## 2024-05-11 ENCOUNTER — Emergency Department (HOSPITAL_COMMUNITY)

## 2024-05-11 ENCOUNTER — Encounter (HOSPITAL_COMMUNITY): Payer: Self-pay

## 2024-05-11 DIAGNOSIS — J181 Lobar pneumonia, unspecified organism: Secondary | ICD-10-CM | POA: Insufficient documentation

## 2024-05-11 DIAGNOSIS — Z79899 Other long term (current) drug therapy: Secondary | ICD-10-CM | POA: Diagnosis not present

## 2024-05-11 DIAGNOSIS — R1031 Right lower quadrant pain: Secondary | ICD-10-CM | POA: Insufficient documentation

## 2024-05-11 DIAGNOSIS — Z9049 Acquired absence of other specified parts of digestive tract: Secondary | ICD-10-CM | POA: Diagnosis not present

## 2024-05-11 DIAGNOSIS — E039 Hypothyroidism, unspecified: Secondary | ICD-10-CM | POA: Diagnosis not present

## 2024-05-11 DIAGNOSIS — R109 Unspecified abdominal pain: Secondary | ICD-10-CM | POA: Diagnosis not present

## 2024-05-11 DIAGNOSIS — J449 Chronic obstructive pulmonary disease, unspecified: Secondary | ICD-10-CM | POA: Insufficient documentation

## 2024-05-11 DIAGNOSIS — R918 Other nonspecific abnormal finding of lung field: Secondary | ICD-10-CM | POA: Diagnosis not present

## 2024-05-11 DIAGNOSIS — I10 Essential (primary) hypertension: Secondary | ICD-10-CM | POA: Diagnosis not present

## 2024-05-11 DIAGNOSIS — K5730 Diverticulosis of large intestine without perforation or abscess without bleeding: Secondary | ICD-10-CM | POA: Diagnosis not present

## 2024-05-11 DIAGNOSIS — K5790 Diverticulosis of intestine, part unspecified, without perforation or abscess without bleeding: Secondary | ICD-10-CM

## 2024-05-11 DIAGNOSIS — R079 Chest pain, unspecified: Secondary | ICD-10-CM | POA: Diagnosis not present

## 2024-05-11 DIAGNOSIS — J189 Pneumonia, unspecified organism: Secondary | ICD-10-CM | POA: Insufficient documentation

## 2024-05-11 DIAGNOSIS — R059 Cough, unspecified: Secondary | ICD-10-CM | POA: Diagnosis present

## 2024-05-11 DIAGNOSIS — R0602 Shortness of breath: Secondary | ICD-10-CM | POA: Diagnosis present

## 2024-05-11 LAB — COMPREHENSIVE METABOLIC PANEL WITH GFR
ALT: 6 U/L (ref 0–44)
AST: 12 U/L — ABNORMAL LOW (ref 15–41)
Albumin: 3.3 g/dL — ABNORMAL LOW (ref 3.5–5.0)
Alkaline Phosphatase: 72 U/L (ref 38–126)
Anion gap: 12 (ref 5–15)
BUN: 10 mg/dL (ref 8–23)
CO2: 25 mmol/L (ref 22–32)
Calcium: 9.2 mg/dL (ref 8.9–10.3)
Chloride: 104 mmol/L (ref 98–111)
Creatinine, Ser: 0.99 mg/dL (ref 0.44–1.00)
GFR, Estimated: >60 mL/min (ref >60)
Glucose, Bld: 114 mg/dL — ABNORMAL HIGH (ref 70–99)
Potassium: 3.2 mmol/L — ABNORMAL LOW (ref 3.5–5.1)
Sodium: 141 mmol/L (ref 135–145)
Total Bilirubin: 0.7 mg/dL (ref 0.0–1.2)
Total Protein: 6.8 g/dL (ref 6.5–8.1)

## 2024-05-11 LAB — CBC
HCT: 35.3 % — ABNORMAL LOW (ref 36.0–46.0)
Hemoglobin: 11.6 g/dL — ABNORMAL LOW (ref 12.0–15.0)
MCH: 28.4 pg (ref 26.0–34.0)
MCHC: 32.9 g/dL (ref 30.0–36.0)
MCV: 86.5 fL (ref 80.0–100.0)
Platelets: 190 K/uL (ref 150–400)
RBC: 4.08 MIL/uL (ref 3.87–5.11)
RDW: 13.2 % (ref 11.5–15.5)
WBC: 7.3 K/uL (ref 4.0–10.5)
nRBC: 0 % (ref 0.0–0.2)

## 2024-05-11 LAB — BRAIN NATRIURETIC PEPTIDE: B Natriuretic Peptide: 46 pg/mL (ref 0.0–100.0)

## 2024-05-11 LAB — URINALYSIS, ROUTINE W REFLEX MICROSCOPIC
Bilirubin Urine: NEGATIVE
Glucose, UA: NEGATIVE mg/dL
Hgb urine dipstick: NEGATIVE
Ketones, ur: NEGATIVE mg/dL
Nitrite: NEGATIVE
Protein, ur: NEGATIVE mg/dL
Specific Gravity, Urine: >1.046 — ABNORMAL HIGH (ref 1.005–1.030)
pH: 6 (ref 5.0–8.0)

## 2024-05-11 LAB — RESP PANEL BY RT-PCR (RSV, FLU A&B, COVID)  RVPGX2
Influenza A by PCR: NEGATIVE
Influenza B by PCR: NEGATIVE
Resp Syncytial Virus by PCR: NEGATIVE
SARS Coronavirus 2 by RT PCR: NEGATIVE

## 2024-05-11 LAB — LIPASE, BLOOD: Lipase: 27 U/L (ref 11–51)

## 2024-05-11 LAB — TROPONIN I (HIGH SENSITIVITY): Troponin I (High Sensitivity): 4 ng/L (ref <18)

## 2024-05-11 MED ORDER — AZITHROMYCIN 250 MG PO TABS: 250.0000 mg | ORAL_TABLET | Freq: Every day | ORAL | 0 refills | Status: DC

## 2024-05-11 MED ORDER — AMOXICILLIN-POT CLAVULANATE 875-125 MG PO TABS: 1.0000 | ORAL_TABLET | Freq: Two times a day (BID) | ORAL | 0 refills | Status: DC

## 2024-05-11 MED ORDER — MORPHINE SULFATE (PF) 4 MG/ML IV SOLN
4.0000 mg | Freq: Once | INTRAVENOUS | Status: AC
  Administered 2024-05-11: 4 mg via INTRAVENOUS
  Filled 2024-05-11: qty 1

## 2024-05-11 MED ORDER — AMOXICILLIN-POT CLAVULANATE 875-125 MG PO TABS
1.0000 | ORAL_TABLET | Freq: Once | ORAL | Status: AC
  Administered 2024-05-11: 1 via ORAL
  Filled 2024-05-11: qty 1

## 2024-05-11 MED ORDER — POTASSIUM CHLORIDE CRYS ER 20 MEQ PO TBCR
40.0000 meq | EXTENDED_RELEASE_TABLET | Freq: Once | ORAL | Status: AC
  Administered 2024-05-11: 40 meq via ORAL
  Filled 2024-05-11: qty 2

## 2024-05-11 MED ORDER — AZITHROMYCIN 250 MG PO TABS
500.0000 mg | ORAL_TABLET | Freq: Once | ORAL | Status: AC
  Administered 2024-05-11: 500 mg via ORAL
  Filled 2024-05-11: qty 2

## 2024-05-11 MED ORDER — IOHEXOL 300 MG/ML  SOLN
100.0000 mL | Freq: Once | INTRAMUSCULAR | Status: AC | PRN
  Administered 2024-05-11: 100 mL via INTRAVENOUS

## 2024-05-11 MED ORDER — ONDANSETRON HCL 4 MG/2ML IJ SOLN
4.0000 mg | Freq: Once | INTRAMUSCULAR | Status: AC
  Administered 2024-05-11: 4 mg via INTRAVENOUS
  Filled 2024-05-11: qty 2

## 2024-05-11 NOTE — Discharge Instructions (Addendum)
 Evaluation today revealed that you have pneumonia. This could be contributing to your symptoms.  Your abdominal pain workup was overall reassuring.  Please follow-up with PCP.  I sent a Zithromax  and Augmentin  to your pharmacy.  Please complete your antibiotic course even if you are feeling better.  If your symptoms worsen or you develop a fever or any other concerning symptom please return to the ED for further evaluation.

## 2024-05-11 NOTE — ED Triage Notes (Signed)
 Pt arrived via POV c/o Left Side abdominal pain for the past few weeks. Pt reports she cannot look at food w/o being sick and throwing up. Pt endorses SOB. Pt reports irregular bowel movements and has not had a bowel movement for a few days.

## 2024-05-11 NOTE — ED Provider Notes (Signed)
 [place] EMERGENCY DEPARTMENT AT [facility] Provider Note   CSN: [id number] Arrival date & time: 05/11/24  1821     Patient presents with: Abdominal Pain  HPI Melody [name] is a 68 y.o. female with history of cholecystectomy, tubal ligation, abdominal hysterectomy, hypertension, COPD, GERD and fibromyalgia and hypothyroidism presenting for abdominal pain and shortness of breath.  Her symptoms started a week ago.  Abdominal pain is primarily in the right lower quadrant.  Endorses some nausea but no vomiting or diarrhea.  States she has not had a bowel movement for 4 days and only passing minimal amount of gas.  She also states that she had a cough that started about a week ago as well it is nonproductive and shortly after developed shortness of breath.  She also mention that she has had intermittent chest pain that is worse with cough.  Endorses subjective fever as well at home.    Abdominal Pain      Prior to Admission medications   Medication Sig Start Date End Date Taking? Authorizing Provider  albuterol  (VENTOLIN  HFA) 108 (90 Base) MCG/ACT inhaler Inhale 2 puffs into the lungs every 4 (four) hours as needed for wheezing or shortness of breath. 03/05/24   Plotnikov, Karlynn GAILS, MD  ALPRAZolam  (XANAX ) 1 MG tablet Take 1 mg by mouth 3 (three) times daily.    [provider]  clotrimazole  (MYCELEX ) 10 MG troche Take 1 tablet (10 mg total) by mouth 5 (five) times daily. Patient not taking: Reported on 04/21/2024 02/18/24   Darlean Ozell NOVAK, MD  clotrimazole -betamethasone  (LOTRISONE ) cream Apply 1 Application topically daily. 02/11/24   Alvia Corean CROME, FNP  cyanocobalamin  (VITAMIN B12) 1000 MCG/ML injection INJECT INTO THE SKIN EVERY 14 DAYS 08/27/23   Plotnikov, Aleksei V, MD  diclofenac  Sodium (VOLTAREN ) 1 % GEL APPLY TWO GRAMS TO AFFECTED AREA(S) FOUR TIMES DAILY 02/22/22   Plotnikov, Aleksei V, MD  dicyclomine  (BENTYL ) 20 MG tablet TAKE 1 TABLET BY MOUTH THREE  TIMES DAILY BEFORE MEALS 04/22/24   Carlan, Chelsea L, NP  fluticasone  (FLONASE ) 50 MCG/ACT nasal spray INSTILL TWO SPRAYS IN EACH NOSTRIL DAILY 10/04/23   Plotnikov, Aleksei V, MD  Lactulose  20 GM/30ML SOLN Take 30-60 mLs (20-40 g total) by mouth 2 (two) times daily as needed (severe constipation). 02/27/24   Plotnikov, Aleksei V, MD  levocetirizine (XYZAL ) 5 MG tablet TAKE 1 TABLET BY MOUTH EVERY EVENING (FOR ALLERGY ) 09/20/21   Plotnikov, Karlynn GAILS, MD  levothyroxine  (SYNTHROID ) 50 MCG tablet TAKE 1 TABLET BY MOUTH EVERY MORNING 11/21/23   Plotnikov, Aleksei V, MD  losartan  (COZAAR ) 25 MG tablet TAKE 1 TABLET BY MOUTH DAILY 12/23/23   Plotnikov, Aleksei V, MD  methylphenidate  (RITALIN ) 20 MG tablet Take 1 tablet (20 mg total) by mouth daily. 11/10/18   Johnson, Clanford L, MD  oxyCODONE -acetaminophen  (PERCOCET/ROXICET) 5-325 MG tablet TAKE 1 TABLET BY MOUTH EVERY 8 HOURS AS NEEDED FOR SEVERE PAIN 04/23/24   Plotnikov, Aleksei V, MD  pantoprazole  (PROTONIX ) 40 MG tablet TAKE 1 TABLET BY MOUTH TWICE DAILY 01/21/24   Plotnikov, Aleksei V, MD  polyethylene glycol powder (GLYCOLAX /MIRALAX ) 17 GM/SCOOP powder Take 17 g by mouth 2 (two) times daily for 14 days, THEN 17 g daily. 04/21/24 09/02/24  Carlan, Chelsea L, NP  psyllium (METAMUCIL) 58.6 % packet Take 1 packet by mouth 2 (two) times daily. 04/21/24   Carlan, Chelsea L, NP  QUEtiapine  (SEROQUEL ) 25 MG tablet Take 25 mg by mouth at bedtime.  [provider]  SPIRIVA  RESPIMAT 2.5 MCG/ACT AERS INHALE TWO PUFFS BY MOUTH EVERY DAY 07/24/23   Plotnikov, Aleksei V, MD    Allergies: Flagyl  [metronidazole ], Doxycycline hyclate, Dulera  [mometasone  furo-formoterol fum], Gabapentin , Nabumetone, Sulfa antibiotics, Tetracycline hcl, and Tramadol  hcl    Review of Systems  Gastrointestinal:  Positive for abdominal pain.    Updated Vital Signs BP 107/73   Pulse 75   Temp 97.7 F (36.5 C) (Oral)   Resp 18   Ht 5' 6 (1.676 m)   Wt 54.4 kg   LMP 10/29/2006  (Approximate)   SpO2 96%   BMI 19.37 kg/m   Physical Exam Vitals and nursing note reviewed.  HENT:     Head: Normocephalic and atraumatic.     Mouth/Throat:     Mouth: Mucous membranes are moist.  Eyes:     General:        Right eye: No discharge.        Left eye: No discharge.     Conjunctiva/sclera: Conjunctivae normal.  Cardiovascular:     Rate and Rhythm: Normal rate and regular rhythm.     Pulses: Normal pulses.     Heart sounds: Normal heart sounds.  Pulmonary:     Effort: Pulmonary effort is normal.     Breath sounds: Examination of the left-lower field reveals rales. Rales present. No wheezing or rhonchi.  Abdominal:     General: Abdomen is flat.     Palpations: Abdomen is soft.     Tenderness: There is generalized abdominal tenderness.  Skin:    General: Skin is warm and dry.  Neurological:     General: No focal deficit present.  Psychiatric:        Mood and Affect: Mood normal.     (all labs ordered are listed, but only abnormal results are displayed) Labs Reviewed  COMPREHENSIVE METABOLIC PANEL WITH GFR - Abnormal; Notable for the following components:      Result Value   Potassium 3.2 (*)    Glucose, Bld 114 (*)    Albumin 3.3 (*)    AST 12 (*)    All other components within normal limits  CBC - Abnormal; Notable for the following components:   Hemoglobin 11.6 (*)    HCT 35.3 (*)    All other components within normal limits  RESP PANEL BY RT-PCR (RSV, FLU A&B, COVID)  RVPGX2  LIPASE, BLOOD  BRAIN NATRIURETIC PEPTIDE  URINALYSIS, ROUTINE W REFLEX MICROSCOPIC  TROPONIN I (HIGH SENSITIVITY)    EKG: EKG Interpretation Date/Time:  Monday May 11 2024 19:22:37 EDT Ventricular Rate:  70 PR Interval:  166 QRS Duration:  94 QT Interval:  439 QTC Calculation: 474 R Axis:   28  Text Interpretation: Sinus rhythm Borderline low voltage, extremity leads Confirmed by Cleotilde Rogue ([id number]) on 05/11/2024 7:29:40 PM  Radiology: CT ABDOMEN PELVIS W  CONTRAST Result Date: 05/11/2024 CLINICAL DATA:  Acute abdominal pain on the left with vomiting, initial encounter EXAM: CT ABDOMEN AND PELVIS WITH CONTRAST TECHNIQUE: Multidetector CT imaging of the abdomen and pelvis was performed using the standard protocol following bolus administration of intravenous contrast. RADIATION DOSE REDUCTION: This exam was performed according to the departmental dose-optimization program which includes automated exposure control, adjustment of the mA and/or kV according to patient size and/or use of iterative reconstruction technique. CONTRAST:  OMNIPAQUE  IOHEXOL  300 MG/ML  SOLN COMPARISON:  02/26/2024 FINDINGS: Lower chest: Lung bases again demonstrate bronchi ectatic changes worse on the right than the  left. Chronic scarring in the lingula is again seen. Some tree-in-bud changes in the right lower lobe are noted stable from the prior exam again likely related underlying inflammatory change. Hepatobiliary: No focal liver abnormality is seen. Status post cholecystectomy. No biliary dilatation. Pancreas: Unremarkable. No pancreatic ductal dilatation or surrounding inflammatory changes. Spleen: Normal in size without focal abnormality. Adrenals/Urinary Tract: Adrenal glands are within normal limits. Kidneys are well visualized bilaterally. No renal calculi or obstructive changes are noted. The bladder is well distended. Stomach/Bowel: Diffuse diverticular change of the colon is noted without evidence of diverticulitis. The appendix is within normal limits. Small bowel and stomach are unremarkable. Vascular/Lymphatic: Aortic atherosclerosis. No enlarged abdominal or pelvic lymph nodes. Reproductive: Status post hysterectomy. No adnexal masses. Other: No abdominal wall hernia or abnormality. No abdominopelvic ascites. Musculoskeletal: No acute or significant osseous findings. Degenerative changes at L4-5 are again seen. IMPRESSION: Diverticulosis without diverticulitis. Stable  changes in the lung bases consistent with chronic infection. Stable scarring in the lingula. Electronically Signed   By: Oneil Devonshire M.D.   On: 05/11/2024 20:12   DG Chest 2 View Result Date: 05/11/2024 CLINICAL DATA:  cp EXAM: CHEST - 2 VIEW COMPARISON:  Cxr 02/18/24 FINDINGS: The heart and mediastinal contours are unchanged Developing left lower airspace opacity. No pulmonary edema. No pleural effusion. No pneumothorax. No acute osseous abnormality. IMPRESSION: Developing left lower airspace opacity. Followup PA and lateral chest X-ray is recommended in 3-4 weeks following therapy to ensure resolution and exclude underlying malignancy. Electronically Signed   By: Morgane  Naveau M.D.   On: 05/11/2024 19:34     Procedures   Medications Ordered in the ED  amoxicillin -clavulanate (AUGMENTIN ) 875-125 MG per tablet 1 tablet (has no administration in time range)  azithromycin  (ZITHROMAX ) tablet 500 mg (has no administration in time range)  ondansetron  (ZOFRAN ) injection 4 mg (4 mg Intravenous Given 05/11/24 1927)  morphine  (PF) 4 MG/ML injection 4 mg (4 mg Intravenous Given 05/11/24 1926)  iohexol  (OMNIPAQUE ) 300 MG/ML solution 100 mL (100 mLs Intravenous Contrast Given 05/11/24 1953)  potassium chloride  SA (KLOR-CON  M) CR tablet 40 mEq (40 mEq Oral Given 05/11/24 2102)                                    Medical Decision Making Amount and/or Complexity of Data Reviewed Labs: ordered. Radiology: ordered.  Risk Prescription drug management.   Initial Impression and Ddx 68 year old well-appearing female presenting for abdominal pain, shortness of breath and cough.  Exam notable for generalized abdominal tenderness, rales in the left lower base of the lungs but otherwise reassuring.  DDx includes bowel obstruction, appendicitis, kidney stone, diverticulitis, other.  DDx for shortness of breath and cough includes pneumonia, sepsis, COPD exacerbation, CHF exacerbation, PE, ACS, other.  Patient PMH  that increases complexity of ED encounter: history of cholecystectomy, tubal ligation, abdominal hysterectomy, hypertension, COPD, GERD and fibromyalgia and hypothyroidism  Interpretation of Diagnostics - I independent reviewed and interpreted the labs as followed: hypokalemia  - I independently visualized the following imaging with scope of interpretation limited to determining acute life threatening conditions related to emergency care: CT ab/pelvis, which revealed diverticulosis but otherwise no acute findings.  Chest x-ray with developing left lower airspace opacity concerning for pneumonia.  -I personally reviewed and interpreted EKG which revealed sinus rhythm  Patient Reassessment and Ultimate Disposition/Management Workup was suggestive of pneumonia. She does not appear septic. Started her  on azithromycin  and Augmentin . Abdominal pain workup was largely reassuring.  There was diverticulosis on CT scan but otherwise reassuring study.  Sent antibiotics to her pharmacy. Advised her to follow-up with her PCP. Curb 65 score designated her is low risk for pneumonia.  I agree with the guidelines and feel she is safe for discharge.  We did discuss pertinent return precautions. Discharged in good condition.  Patient management required discussion with the following services or consulting groups:  None  Complexity of Problems Addressed Acute complicated illness or Injury  Additional Data Reviewed and Analyzed Further history obtained from: Further history from spouse/family member, Past medical history and medications listed in the EMR, and Prior ED visit notes  Patient Encounter Risk Assessment Consideration of hospitalization      Final diagnoses:  Pneumonia of left lower lobe due to infectious organism    ED Discharge Orders     None          Lang Norleen POUR, PA-C 05/11/24 2104    Jerrol Agent, MD 05/11/24 2119

## 2024-05-13 ENCOUNTER — Telehealth (INDEPENDENT_AMBULATORY_CARE_PROVIDER_SITE_OTHER): Payer: Self-pay

## 2024-05-13 NOTE — Telephone Encounter (Signed)
 I tried calling the patient back to get more information, no answer. I left her a vm that if she still needed assistance to please call us  back.  Patient called today, and left a voice message on the nurse line stating she had a recent visit to ED on 05/11/2024 Melody [name] due to pneumonia and patient states they told her she had an infected colon. She says she thinks she needs an appointment.

## 2024-05-14 ENCOUNTER — Other Ambulatory Visit (INDEPENDENT_AMBULATORY_CARE_PROVIDER_SITE_OTHER): Payer: Self-pay | Admitting: Gastroenterology

## 2024-05-14 MED ORDER — ONDANSETRON HCL 4 MG PO TABS: 4.0000 mg | ORAL_TABLET | Freq: Three times a day (TID) | ORAL | 1 refills | Status: AC | PRN

## 2024-05-14 NOTE — Telephone Encounter (Signed)
 Patient called the office back today stating she has not felt well and was told she needed to make a follow up visit here since her recent ED visit in which they told her she had pneumonia and a infection in her colon. She says she had not been able to have a good Bm in about two weeks she was able to release her bowels today and says there was a significant amount of stool in the toilet. She says She has felt nauseous,but this has gotten better since having the bm today. She denies any abdominal pain, vomiting or fevers and is currently on two antibiotics. She says she uses [facility] and would like something for nausea called in. I told her I would make you aware of her current condition and I transferred her up front to [place] to get an appointment scheduled for the Ed visit follow up. Please advise. Thanks,

## 2024-05-14 NOTE — Telephone Encounter (Signed)
 I spoke with the patient and made her aware per [facility], I reviewed her visit from the ED along with labs and CT, appears she has a lung infection but I do not see any evidence of an infection in her colon or any abnormalities on the CT of her GI tract. I will send some zofran  in for her to take as needed for nausea. Patient states understanding and aware the next available appointment with Dr. Eartha for a Ed follow up on 05/28/2024 at 3:50 pm. I advised if she got to feeling worse to go to the Ed. Patient states understanding of all.

## 2024-05-28 ENCOUNTER — Encounter (INDEPENDENT_AMBULATORY_CARE_PROVIDER_SITE_OTHER): Admitting: Gastroenterology

## 2024-05-31 ENCOUNTER — Other Ambulatory Visit: Payer: Self-pay | Admitting: Internal Medicine

## 2024-06-18 ENCOUNTER — Other Ambulatory Visit: Payer: Self-pay | Admitting: Internal Medicine

## 2024-06-22 ENCOUNTER — Ambulatory Visit (INDEPENDENT_AMBULATORY_CARE_PROVIDER_SITE_OTHER): Admitting: Gastroenterology

## 2024-07-19 ENCOUNTER — Other Ambulatory Visit: Payer: Self-pay | Admitting: Internal Medicine

## 2024-07-21 ENCOUNTER — Other Ambulatory Visit: Payer: Self-pay | Admitting: Internal Medicine

## 2024-07-21 DIAGNOSIS — J209 Acute bronchitis, unspecified: Secondary | ICD-10-CM

## 2024-07-21 DIAGNOSIS — K219 Gastro-esophageal reflux disease without esophagitis: Secondary | ICD-10-CM

## 2024-07-22 ENCOUNTER — Telehealth: Payer: Self-pay | Admitting: Radiology

## 2024-07-22 NOTE — Telephone Encounter (Signed)
 Copied from CRM #[id number]. Topic: General - Call Back - No Documentation >> Jul 22, 2024 12:26 PM Zy'onna H wrote: Reason for CRM: Patient stated she received a call and she didn't know what for.   I looked through her chart to investigate what the call was possibly for.   Other than her latest Gastro and PCP appointments there's nothing needed on this patient currently, she seems confused on why she received one.    Please Advise with a voicemail if any other information is required from the patient.

## 2024-07-23 ENCOUNTER — Encounter (INDEPENDENT_AMBULATORY_CARE_PROVIDER_SITE_OTHER): Payer: Self-pay | Admitting: Gastroenterology

## 2024-07-23 ENCOUNTER — Ambulatory Visit (INDEPENDENT_AMBULATORY_CARE_PROVIDER_SITE_OTHER): Admitting: Gastroenterology

## 2024-07-23 VITALS — BP 107/65 | HR 80 | Temp 97.9°F | Ht 66.0 in | Wt 129.7 lb

## 2024-07-23 DIAGNOSIS — K5900 Constipation, unspecified: Secondary | ICD-10-CM | POA: Diagnosis not present

## 2024-07-23 DIAGNOSIS — R109 Unspecified abdominal pain: Secondary | ICD-10-CM

## 2024-07-23 DIAGNOSIS — R1030 Lower abdominal pain, unspecified: Secondary | ICD-10-CM

## 2024-07-23 DIAGNOSIS — K219 Gastro-esophageal reflux disease without esophagitis: Secondary | ICD-10-CM

## 2024-07-23 NOTE — Patient Instructions (Signed)
-  continue protonix  40mg  twice a day for now -Increase water  intake, aim for atleast 64 oz per day -Increase fruits, veggies and whole grains, kiwi and prunes are especially good for constipation -stop lactulose /miralax  -avoid greasy, spicy, tomato/citrus based foods, avoid eating late -continue bentyl  20mg   -start linzess 72mcg daily-samples given. Let me know how this is working for you In about 2 weeks  Linzess works best when taken once a day every day, on an empty stomach, at least 30 minutes before your first meal of the day.  Diarrhea may occur in the first 2 weeks -keep taking it.  The diarrhea should go away and you should start having normal, complete, full bowel movements.  Follow up 2 months  It was a pleasure to see you today. I want to create trusting relationships with patients and provide genuine, compassionate, and quality care. I truly value your feedback! please be on the lookout for a survey regarding your visit with me today. I appreciate your input about our visit and your time in completing this!    Melody Boakye L. [place] Spees, MSN, APRN, AGNP-C Adult-Gerontology Nurse Practitioner [facility] at [facility]

## 2024-07-23 NOTE — Progress Notes (Signed)
 Referring Provider: Garald Karlynn GAILS, MD Primary Care Physician:  Garald Karlynn GAILS, MD Primary GI Physician: Dr. Eartha   Chief Complaint  Patient presents with   Follow-up    Pt arrives for follow up. Pt has not used bathroom since the other day. Pt states when she does have a BM it is hard. Pt states pain is like having a baby, it doubles her over. Dicyclomine  helps at times. Using Benefiber but not helping. Someone told her to request Linzess. Pain in lower abdominal area. Has bloating at times.    HPI:   Melody [name] is a 68 y.o. female with past medical history of anxiety, depression, fibromyalgia, GERD, hypothyroidism, respiratory MAI   Patient presenting today for:  Follow up of abdominal pain, GERD and constipation  Lat seen June, at thatt ime having abdominal pain, worse with certain foods, improved after defecation. Infrequent BMs, improved on  lactulose . Unsure if she is taking bentyl  or not  Recommended bentyl  20mg  TID PRN, increase water , fiber, start miralax  1capful BID x2 weeks then metamucil BID  Present:  She states ongoing constipation, having to strain some when she goes, stools are hard to pass. She notes abdominal pain improves with a good BM. She is taking a laxative and lactulose  without much result. Did not have improvement with miralax  and metamucil. Her BMs are very infrequent. She inquires about linzess. She notes she will start to have abdominal cramping prior to urge to defecate. She notes that she has taken bentyl  which does help her abdominal pain but she also reports she has a sour/acid taste in her mouth when she takes it.   She tries to avoid eating late so she does not have reflux. Taking protonix  40mg  BID and sometimes will have some acid regurgitation at times.   She endorses some lightheadedness upon standing at times. Water  intake is not great.    CT A/P with contrast: 04/2024 Diverticulosis without diverticulitis. Stable changes in  the lung bases consistent with chronic infection. Stable scarring in the lingula.  CT A/P without contrast: 12/2023 Nonobstructive left renal calculus. No definite ureteral calculus obstructive uropathy. Evaluation of the mid to distal ureters is limited due to overlapping structures and vascular calcifications. 2. Pelvic floor laxity with small cystocele. 3. Bronchiectasis with multifocal mucous impaction. Scattered pulmonary nodules are noted at the lung bases measuring up to 1.6 cm in the left lower lobe. While findings may be related to infectious or inflammatory process, the possibility of underlying malignancy can not be excluded. 4. Aortic atherosclerosis. Last Colonoscopy:09/2022 - One 2 mm polyp in the transverse colon (lymph node)                           - Diverticulosis in the sigmoid colon, in the                            descending colon, in the transverse colon and in                            the ascending colon. normal colon biopsied-normal                           - Non-bleeding internal hemorrhoids.   Last Endoscopy: 09/2022 - No endoscopic esophageal abnormality to explain  patient's dysphagia. Esophagus dilated. Dilated.                           - 2 cm hiatal hernia.                           - Normal stomach. Biopsied-normal                            - Normal examined duodenum. Biopsied-normal    Recommendations:  Repeat TCS 10 years   Past Medical History:  Diagnosis Date   Anxiety    Blood dyscrasia    thrombocytopenia   Bronchiectasis (HCC)    Chronic back pain    Chronic neck pain    COPD (chronic obstructive pulmonary disease) (HCC)    Depression    bipolar- Dr Vincente   Fibromyalgia    GERD (gastroesophageal reflux disease)    Headache    Migraine,   History of hiatal hernia    Hypertension    Hypothyroidism    Low back pain    Dr Mavis   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[facility]    Osteoarthritis     Pneumonia    Pulmonary nodule    Vertigo    Vitamin B12 deficiency     Past Surgical History:  Procedure Laterality Date   ABDOMINAL HYSTERECTOMY N/A 01/10/2016   Procedure: HYSTERECTOMY ABDOMINAL;  Surgeon: Bobie FORBES Cathlyn JAYSON Nikki, MD;  Location: [facility];  Service: Gynecology;  Laterality: N/A;   ABDOMINAL SACROCOLPOPEXY N/A 01/10/2016   Procedure: ABDOMINO SACROCOLPOPEXY ;  Surgeon: Bobie FORBES Cathlyn JAYSON Nikki, MD;  Location: [facility];  Service: Gynecology;  Laterality: N/A;   ANTERIOR AND POSTERIOR REPAIR N/A 01/10/2016   Procedure:  POSTERIOR REPAIR (RECTOCELE);  Surgeon: Bobie FORBES Cathlyn JAYSON Nikki, MD;  Location: [facility];  Service: Gynecology;  Laterality: N/A;   BIOPSY  10/19/2022   Procedure: BIOPSY;  Surgeon: Eartha Angelia Sieving, MD;  Location: [facility];  Service: Gastroenterology;;   BLADDER SUSPENSION N/A 01/10/2016   Procedure: TRANSVAGINAL TAPE (TVT) PROCEDURE exact midurethral sling;  Surgeon: Bobie FORBES Cathlyn JAYSON Nikki, MD;  Location: [facility];  Service: Gynecology;  Laterality: N/A;   CERVICAL LAMINECTOMY  2001 and 2005   Dr Mavis    ACDF  C5-6, C6-7   CHOLECYSTECTOMY N/A 09/23/2023   Procedure: LAPAROSCOPIC CHOLECYSTECTOMY;  Surgeon: Signe Mitzie LABOR, MD;  Location: [facility];  Service: General;  Laterality: N/A;   COLONOSCOPY WITH PROPOFOL  N/A 10/19/2022   Procedure: COLONOSCOPY WITH PROPOFOL ;  Surgeon: Eartha Angelia Sieving, MD;  [facility];  Service: Gastroenterology;  Laterality: N/A;  10:30am, asa 1-2   CYSTO N/A 01/10/2016   Procedure: CYSTOSCOPY;  Surgeon: Bobie FORBES Cathlyn JAYSON Nikki, MD;  Location: [facility];  Service: Gynecology;  Laterality: N/A;   ESOPHAGEAL DILATION N/A 10/19/2022   Procedure: ESOPHAGEAL DILATION;  Surgeon: Eartha Angelia Sieving, MD;  [facility];  Service: Gastroenterology;  Laterality: N/A;   ESOPHAGOGASTRODUODENOSCOPY (EGD) WITH PROPOFOL  N/A 10/19/2022   Procedure: ESOPHAGOGASTRODUODENOSCOPY (EGD) WITH PROPOFOL ;   Surgeon: Eartha Angelia Sieving, MD;  [facility];  Service: Gastroenterology;  Laterality: N/A;   POLYPECTOMY  10/19/2022   Procedure: POLYPECTOMY INTESTINAL;  Surgeon: Eartha Angelia Sieving, MD;  [facility];  Service: Gastroenterology;;   SALPINGOOPHORECTOMY Bilateral 01/10/2016   Procedure: BILATERAL SALPINGO OOPHORECTOMY;  Surgeon: Bobie  E Cathlyn JAYSON Cary, MD;  Location: [facility];  Service: Gynecology;  Laterality: Bilateral;   SAVORY DILATION  10/19/2022   Procedure: SAVORY DILATION;  Surgeon: Eartha Flavors, Toribio, MD;  [facility];  Service: Gastroenterology;;   TUBAL LIGATION  10/29/1978    Current Outpatient Medications  Medication Sig Dispense Refill   ondansetron  (ZOFRAN ) 4 MG tablet Take 1 tablet (4 mg total) by mouth every 8 (eight) hours as needed for nausea or vomiting. 20 tablet 1   albuterol  (VENTOLIN  HFA) 108 (90 Base) MCG/ACT inhaler Inhale 2 puffs into the lungs every 4 (four) hours as needed for wheezing or shortness of breath. 8.5 g 5   ALPRAZolam  (XANAX ) 1 MG tablet Take 1 mg by mouth 3 (three) times daily.     amoxicillin -clavulanate (AUGMENTIN ) 875-125 MG tablet Take 1 tablet by mouth every 12 (twelve) hours. 14 tablet 0   azithromycin  (ZITHROMAX ) 250 MG tablet Take 1 tablet (250 mg total) by mouth daily. Take first 2 tablets together, then 1 every day until finished. 6 tablet 0   Cholecalciferol (VITAMIN D3) 1.25 MG (50000 UT) CAPS TAKE ONE CAPSULE BY MOUTH EVERY 14 DAYS ON TUESDAYS 6 capsule 3   clotrimazole  (MYCELEX ) 10 MG troche Take 1 tablet (10 mg total) by mouth 5 (five) times daily. (Patient not taking: Reported on 04/21/2024) 30 Troche 11   clotrimazole -betamethasone  (LOTRISONE ) cream Apply 1 Application topically daily. 30 g 0   cyanocobalamin  (VITAMIN B12) 1000 MCG/ML injection INJECT 1ML UNDER THE SKIN EVERY 14 DAYS 6 mL 3   diclofenac  Sodium (VOLTAREN ) 1 % GEL APPLY TWO GRAMS TO AFFECTED AREA(S) FOUR TIMES DAILY  200 g 3   dicyclomine  (BENTYL ) 20 MG tablet TAKE 1 TABLET BY MOUTH THREE TIMES DAILY BEFORE MEALS 90 tablet 1   fluticasone  (FLONASE ) 50 MCG/ACT nasal spray INSTILL TWO SPRAYS IN EACH NOSTRIL DAILY 48 g 3   Lactulose  20 GM/30ML SOLN Take 30-60 mLs (20-40 g total) by mouth 2 (two) times daily as needed (severe constipation). 450 mL 3   levocetirizine (XYZAL ) 5 MG tablet TAKE 1 TABLET BY MOUTH EVERY EVENING (FOR ALLERGY ) 90 tablet 3   levothyroxine  (SYNTHROID ) 50 MCG tablet TAKE 1 TABLET BY MOUTH EVERY MORNING 90 tablet 3   losartan  (COZAAR ) 25 MG tablet TAKE 1 TABLET BY MOUTH DAILY 30 tablet 5   methylphenidate  (RITALIN ) 20 MG tablet Take 1 tablet (20 mg total) by mouth daily.  0   oxyCODONE -acetaminophen  (PERCOCET/ROXICET) 5-325 MG tablet TAKE 1 TABLET BY MOUTH EVERY 8 HOURS AS NEEDED FOR SEVERE PAIN 60 tablet 0   pantoprazole  (PROTONIX ) 40 MG tablet TAKE 1 TABLET BY MOUTH TWICE DAILY 60 tablet 5   polyethylene glycol powder (GLYCOLAX /MIRALAX ) 17 GM/SCOOP powder Take 17 g by mouth 2 (two) times daily for 14 days, THEN 17 g daily. 850 g 2   psyllium (METAMUCIL) 58.6 % packet Take 1 packet by mouth 2 (two) times daily. 60 each 3   QUEtiapine  (SEROQUEL ) 25 MG tablet Take 25 mg by mouth at bedtime.     SPIRIVA  RESPIMAT 2.5 MCG/ACT AERS INHALE TWO PUFFS BY MOUTH EVERY DAY 4 g 11   No current facility-administered medications for this visit.    Allergies as of 07/23/2024 - Review Complete 07/23/2024  Allergen Reaction Noted   Flagyl  [metronidazole ] Shortness Of Breath 08/06/2018   Doxycycline hyclate Itching    Dulera  [mometasone  furo-formoterol fum]  08/29/2022   Gabapentin   11/12/2017   Nabumetone Other (See Comments) 09/01/2010  Sulfa antibiotics Other (See Comments) 11/08/2013   Tetracycline hcl Itching    Tramadol  hcl Other (See Comments) 03/18/2023    Social History   Socioeconomic History   Marital status: Legally Separated    Spouse name: Not on file   Number of children: Not on  file   Years of education: Not on file   Highest education level: Not on file  Occupational History   Not on file  Tobacco Use   Smoking status: Former    Current packs/day: 0.00    Average packs/day: 1 pack/day for 48.5 years (48.5 ttl pk-yrs)    Types: Cigarettes    Start date: 11/21/1962    Quit date: 05/06/2011    Years since quitting: 13.2   Smokeless tobacco: Never   Tobacco comments:    stopped periodically - peak rate of 1 ppd  Vaping Use   Vaping status: Never Used  Substance and Sexual Activity   Alcohol use: No    Alcohol/week: 0.0 standard drinks of alcohol   Drug use: No   Sexual activity: Not Currently    Birth control/protection: Post-menopausal, Surgical    Comment: Tubal/Hyst/BSO  Other Topics Concern   Not on file  Social History Narrative   [facility] (04/04/17):   Originally from [place], [place]. 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 Recently had a persian cat that passed. No bird exposure. She denies any indoor plants currently. She does use wood burning for heat. She reports there is water  getting under her foundation. She has mold in her home that she reports is in the 2nd floor of her home. She has recently started seeing mold under her bed. She reports her home smells musty & damp. She has always lived in [place].    Social Drivers of Health   Financial Resource Strain: High Risk (01/16/2024)   Overall Financial Resource Strain (CARDIA)    Difficulty of Paying Living Expenses: Hard  Food Insecurity: Food Insecurity Present (01/16/2024)   Hunger Vital Sign    Worried About Running Out of Food in the Last Year: Never true    Ran Out of Food in the Last Year: Sometimes true  Transportation Needs: No Transportation Needs (01/16/2024)   PRAPARE - Administrator, Civil Service (Medical): No    Lack of Transportation (Non-Medical): No  Physical Activity: Insufficiently Active (01/16/2024)    Exercise Vital Sign    Days of Exercise per Week: 1 day    Minutes of Exercise per Session: 10 min  Stress: Stress Concern Present (01/16/2024)   [employer] of Occupational Health - Occupational Stress Questionnaire    Feeling of Stress : Very much  Social Connections: Moderately Isolated (01/16/2024)   Social Connection and Isolation Panel    Frequency of Communication with Friends and Family: More than three times a week    Frequency of Social Gatherings with Friends and Family: Three times a week    Attends Religious Services: More than 4 times per year    Active Member of Clubs or Organizations: No    Attends Banker Meetings: Never    Marital Status: Separated    Review of systems General: negative for malaise, night sweats, fever, chills, weight loss Neck: Negative for lumps, goiter, pain and significant neck swelling Resp: Negative for cough, wheezing, dyspnea at rest CV: Negative for chest pain, leg swelling, palpitations, orthopnea GI: denies melena, hematochezia, nausea, vomiting,  diarrhea, dysphagia, odyonophagia, early satiety or unintentional weight loss. +abdominal pain +constipation +GERD MSK: Negative for joint pain or swelling, back pain, and muscle pain. Derm: Negative for itching or rash Psych: Denies depression, anxiety, memory loss, confusion. No homicidal or suicidal ideation.  Heme: Negative for prolonged bleeding, bruising easily, and swollen nodes. Endocrine: Negative for cold or heat intolerance, polyuria, polydipsia and goiter. Neuro: negative for tremor, gait imbalance, syncope and seizures. The remainder of the review of systems is noncontributory.  Physical Exam: LMP 10/29/2006 (Approximate)  General:   Alert and oriented. No distress noted. Pleasant and cooperative.  Head:  Normocephalic and atraumatic. Eyes:  Conjuctiva clear without scleral icterus. Mouth:  Oral mucosa pink and moist. Good dentition. No lesions. Heart: Normal rate  and rhythm, s1 and s2 heart sounds present.  Lungs: Clear lung sounds in all lobes. Respirations equal and unlabored. Abdomen:  +BS, soft, non-tender and non-distended. No rebound or guarding. No HSM or masses noted. Derm: No palmar erythema or jaundice Msk:  Symmetrical without gross deformities. Normal posture. Extremities:  Without edema. Neurologic:  Alert and  oriented x4 Psych:  Alert and cooperative. Normal mood and affect.  Invalid input(s): 6 MONTHS   ASSESSMENT: Melody [name] is a 67 y.o. female presenting today for follow up of abdominal pain, constipation and GERD  Abdominal pain and constipation: endorses very hard stools, having to strain a lot to defecate. Having a lot of abdominal pain which improves with a BM. Taking laxatives and lactulose . Bentyl  also helps with her abdominal pain. She inquired about linzess, I will provide some samples. She is worried about how the medication will affect her, we discussed the different doses of linzess but given her concern, we will start low at 72mcg and titrate up as needed. We discussed how to take the medication and that abdominal cramping, diarrhea are normal in the first few weeks and usually taper off, though if she is having 4 or more watery stools per day on this, she should make me aware.   GERD: on protonix  40mg  BID, having some breakthrough symptoms at times though cannot tell me how often, was recently switched back to protonix  by her PCP. Her diet is not great, suspect this is contributing. For now would recommend continuing protonix  40mg  BID, implementing strict reflux precautions. Will consider change in PPI therapy at follow up of breakthrough symptoms persists.    constipation, having to strain some when she goes, stools are hard to pass. She notes abdominal pain improves with a good BM. She is taking a laxative and lactulose  without much result. Did not have improvement with miralax  and metamucil. Her BMs are very  infrequent. She inquires about linzess. She notes she will start to have abdominal cramping prior to urge to defecate. She notes that she has taken bentyl  which does help her abdominal pain but she also reports she has a sour/acid taste in her mouth when she takes it.    PLAN:  -continue protonix  40mg  BID for now -Increase water  intake, aim for atleast 64 oz per day -Increase fruits, veggies and whole grains, kiwi and prunes are especially good for constipation -stop lactulose /miralax  -start linzess 72mcg daily-samples provided, advised to take on an empty stomach, make me aware of 4+ watery stools per day on this -good reflux precautions -consider change in PPI therapy at follow up if breakthrough symptoms persisting  -continue bentyl  20mg    All questions were answered, patient verbalized understanding and is in agreement with plan  as outlined above.   Follow Up: 2 months   Hortensia Duffin L. Mariette, MSN, APRN, AGNP-C Adult-Gerontology Nurse Practitioner [facility]  I have reviewed the note and agree with the APP's assessment as described in this progress note   Toribio Fortune, MD Gastroenterology and Hepatology [facility] Gastroenterology

## 2024-07-27 ENCOUNTER — Telehealth (INDEPENDENT_AMBULATORY_CARE_PROVIDER_SITE_OTHER): Payer: Self-pay

## 2024-07-27 ENCOUNTER — Other Ambulatory Visit (INDEPENDENT_AMBULATORY_CARE_PROVIDER_SITE_OTHER): Payer: Self-pay

## 2024-07-27 NOTE — Telephone Encounter (Signed)
 I spoke with the patient and made her aware per [facility], Recommend she does not take either bentyl  or linzess again as it is unclear which of these may have caused a reaction so we will have to consider both as possible offenders. Is she willing to try a different type of constipation medication?   Patient states understanding and is willing to start on a different constipation medication. Patient uses [employer]. I did take the Bentyl  off of the patient's medication list Linzess was not on the medication list and I marked her chart that she had side effects from both linzess and bentyl .

## 2024-07-27 NOTE — Telephone Encounter (Signed)
 Patient reports headache, and fingers swelling since starting on Linzess and dicyclomine .She started the Linzess and dicyclomine  on Friday and patient says she had her fingers swell up two days ago, this has not subsided she was able to get the ring off of the finger. She says the headache is persistent, but thinks  was ongoing prior to the appointment here on 07/23/2024, she is not sure.  She is using [employer]. Please advise.

## 2024-07-28 ENCOUNTER — Other Ambulatory Visit (INDEPENDENT_AMBULATORY_CARE_PROVIDER_SITE_OTHER): Payer: Self-pay | Admitting: Gastroenterology

## 2024-07-28 ENCOUNTER — Encounter: Payer: Self-pay | Admitting: Internal Medicine

## 2024-07-28 ENCOUNTER — Ambulatory Visit: Admitting: Internal Medicine

## 2024-07-28 VITALS — BP 160/82 | HR 65 | Temp 98.6°F | Ht 66.0 in | Wt 130.4 lb

## 2024-07-28 DIAGNOSIS — J209 Acute bronchitis, unspecified: Secondary | ICD-10-CM

## 2024-07-28 DIAGNOSIS — Z23 Encounter for immunization: Secondary | ICD-10-CM | POA: Diagnosis not present

## 2024-07-28 DIAGNOSIS — E538 Deficiency of other specified B group vitamins: Secondary | ICD-10-CM

## 2024-07-28 DIAGNOSIS — R1030 Lower abdominal pain, unspecified: Secondary | ICD-10-CM | POA: Diagnosis not present

## 2024-07-28 DIAGNOSIS — F419 Anxiety disorder, unspecified: Secondary | ICD-10-CM

## 2024-07-28 DIAGNOSIS — K582 Mixed irritable bowel syndrome: Secondary | ICD-10-CM | POA: Diagnosis not present

## 2024-07-28 MED ORDER — NITROGLYCERIN 0.4 MG/SPRAY TL SOLN: 1.0000 | 2 refills | Status: AC | PRN

## 2024-07-28 MED ORDER — LUBIPROSTONE 8 MCG PO CAPS: 8.0000 ug | ORAL_CAPSULE | Freq: Two times a day (BID) | ORAL | 2 refills | Status: DC

## 2024-07-28 MED ORDER — AZITHROMYCIN 250 MG PO TABS: ORAL_TABLET | ORAL | 0 refills | Status: AC

## 2024-07-28 NOTE — Progress Notes (Addendum)
 Subjective:  Patient ID: Melody [name], female    DOB: 1956/06/21  Age: 68 y.o. MRN: [id number]  CC: Follow-up (Patient states a lot of things have happened and she doesn't know where she is at. States she has IBS. )   HPI AISHWARYA SHIPLETT presents for IBS - states it is worse after her last colonoscopy in Dec 2023 (Dr Eartha): bad cramps w/BM. Seeing Ms Carlan, NP w/GI - on Bentyl , Linzess 72 or Amitiza, Protonix  C/o HAs  Outpatient Medications Prior to Visit  Medication Sig Dispense Refill   albuterol  (VENTOLIN  HFA) 108 (90 Base) MCG/ACT inhaler Inhale 2 puffs into the lungs every 4 (four) hours as needed for wheezing or shortness of breath. 8.5 g 5   ALPRAZolam  (XANAX ) 1 MG tablet Take 1 mg by mouth 3 (three) times daily.     Cholecalciferol (VITAMIN D3) 1.25 MG (50000 UT) CAPS TAKE ONE CAPSULE BY MOUTH EVERY 14 DAYS ON TUESDAYS 6 capsule 3   clotrimazole  (MYCELEX ) 10 MG troche Take 1 tablet (10 mg total) by mouth 5 (five) times daily. 30 Troche 11   clotrimazole -betamethasone  (LOTRISONE ) cream Apply 1 Application topically daily. 30 g 0   cyanocobalamin  (VITAMIN B12) 1000 MCG/ML injection INJECT 1ML UNDER THE SKIN EVERY 14 DAYS 6 mL 3   diclofenac  Sodium (VOLTAREN ) 1 % GEL APPLY TWO GRAMS TO AFFECTED AREA(S) FOUR TIMES DAILY 200 g 3   dicyclomine  (BENTYL ) 20 MG tablet Take 20 mg by mouth every 6 (six) hours.     fluticasone  (FLONASE ) 50 MCG/ACT nasal spray INSTILL TWO SPRAYS IN EACH NOSTRIL DAILY 48 g 3   Lactulose  20 GM/30ML SOLN Take 30-60 mLs (20-40 g total) by mouth 2 (two) times daily as needed (severe constipation). 450 mL 3   levocetirizine (XYZAL ) 5 MG tablet TAKE 1 TABLET BY MOUTH EVERY EVENING (FOR ALLERGY ) 90 tablet 3   levothyroxine  (SYNTHROID ) 50 MCG tablet TAKE 1 TABLET BY MOUTH EVERY MORNING 90 tablet 3   linaclotide (LINZESS) 72 MCG capsule Take 72 mcg by mouth daily before breakfast.     losartan  (COZAAR ) 25 MG tablet TAKE 1 TABLET BY MOUTH DAILY 30 tablet 5    lubiprostone (AMITIZA) 8 MCG capsule Take 1 capsule (8 mcg total) by mouth 2 (two) times daily with a meal. 60 capsule 2   methylphenidate  (RITALIN ) 20 MG tablet Take 1 tablet (20 mg total) by mouth daily.  0   ondansetron  (ZOFRAN ) 4 MG tablet Take 1 tablet (4 mg total) by mouth every 8 (eight) hours as needed for nausea or vomiting. 20 tablet 1   oxyCODONE -acetaminophen  (PERCOCET/ROXICET) 5-325 MG tablet TAKE 1 TABLET BY MOUTH EVERY 8 HOURS AS NEEDED FOR SEVERE PAIN 60 tablet 0   pantoprazole  (PROTONIX ) 40 MG tablet TAKE 1 TABLET BY MOUTH TWICE DAILY 60 tablet 5   polyethylene glycol powder (GLYCOLAX /MIRALAX ) 17 GM/SCOOP powder Take 17 g by mouth 2 (two) times daily for 14 days, THEN 17 g daily. 850 g 2   psyllium (METAMUCIL) 58.6 % packet Take 1 packet by mouth 2 (two) times daily. 60 each 3   QUEtiapine  (SEROQUEL ) 25 MG tablet Take 25 mg by mouth at bedtime.     SPIRIVA  RESPIMAT 2.5 MCG/ACT AERS INHALE TWO PUFFS BY MOUTH EVERY DAY 4 g 11   No facility-administered medications prior to visit.    ROS: Review of Systems  Constitutional:  Positive for fatigue. Negative for activity change, appetite change, chills and unexpected weight change.  HENT:  Negative  for congestion, mouth sores and sinus pressure.   Eyes:  Negative for visual disturbance.  Respiratory:  Negative for cough and chest tightness.   Gastrointestinal:  Positive for abdominal pain and constipation. Negative for blood in stool, diarrhea and nausea.  Genitourinary:  Negative for difficulty urinating, frequency and vaginal pain.  Musculoskeletal:  Positive for arthralgias and back pain. Negative for gait problem.  Skin:  Negative for pallor and rash.  Neurological:  Negative for dizziness, tremors, weakness, numbness and headaches.  Psychiatric/Behavioral:  Positive for dysphoric mood. Negative for confusion, sleep disturbance and suicidal ideas. The patient is nervous/anxious.     Objective:  BP (!) 160/82   Pulse 65    Temp 98.6 F (37 C) (Oral)   Ht 5' 6 (1.676 m)   Wt 130 lb 6.4 oz (59.1 kg)   LMP 10/29/2006 (Approximate)   SpO2 98%   BMI 21.05 kg/m   BP Readings from Last 3 Encounters:  07/28/24 (!) 160/82  07/23/24 107/65  05/11/24 124/71    Wt Readings from Last 3 Encounters:  07/28/24 130 lb 6.4 oz (59.1 kg)  07/23/24 129 lb 11.2 oz (58.8 kg)  05/11/24 120 lb (54.4 kg)    Physical Exam Constitutional:      General: She is not in acute distress.    Appearance: Normal appearance. She is well-developed.  HENT:     Head: Normocephalic.     Right Ear: External ear normal.     Left Ear: External ear normal.     Nose: Nose normal.  Eyes:     General:        Right eye: No discharge.        Left eye: No discharge.     Conjunctiva/sclera: Conjunctivae normal.     Pupils: Pupils are equal, round, and reactive to Schlatter.  Neck:     Thyroid : No thyromegaly.     Vascular: No JVD.     Trachea: No tracheal deviation.  Cardiovascular:     Rate and Rhythm: Normal rate and regular rhythm.     Heart sounds: Normal heart sounds.  Pulmonary:     Effort: No respiratory distress.     Breath sounds: No stridor. No wheezing.  Abdominal:     General: Bowel sounds are normal. There is no distension.     Palpations: Abdomen is soft. There is no mass.     Tenderness: There is no abdominal tenderness. There is no guarding or rebound.  Musculoskeletal:        General: No tenderness.     Cervical back: Normal range of motion and neck supple. No rigidity.  Lymphadenopathy:     Cervical: No cervical adenopathy.  Skin:    Findings: No erythema or rash.  Neurological:     Cranial Nerves: No cranial nerve deficit.     Motor: No abnormal muscle tone.     Coordination: Coordination normal.     Deep Tendon Reflexes: Reflexes normal.  Psychiatric:        Behavior: Behavior normal.        Thought Content: Thought content normal.        Judgment: Judgment normal.     Lab Results  Component Value  Date   WBC 7.3 05/11/2024   HGB 11.6 (L) 05/11/2024   HCT 35.3 (L) 05/11/2024   PLT 190 05/11/2024   GLUCOSE 114 (H) 05/11/2024   CHOL 197 05/28/2022   TRIG 86.0 05/28/2022   HDL 59.90 05/28/2022   LDLCALC 120 (  H) 05/28/2022   ALT 6 05/11/2024   AST 12 (L) 05/11/2024   NA 141 05/11/2024   K 3.2 (L) 05/11/2024   CL 104 05/11/2024   CREATININE 0.99 05/11/2024   BUN 10 05/11/2024   CO2 25 05/11/2024   TSH 2.40 08/23/2023   INR 1.0 04/03/2023    CT ABDOMEN PELVIS W CONTRAST Result Date: 05/11/2024 CLINICAL DATA:  Acute abdominal pain on the left with vomiting, initial encounter EXAM: CT ABDOMEN AND PELVIS WITH CONTRAST TECHNIQUE: Multidetector CT imaging of the abdomen and pelvis was performed using the standard protocol following bolus administration of intravenous contrast. RADIATION DOSE REDUCTION: This exam was performed according to the departmental dose-optimization program which includes automated exposure control, adjustment of the mA and/or kV according to patient size and/or use of iterative reconstruction technique. CONTRAST:  OMNIPAQUE  IOHEXOL  300 MG/ML  SOLN COMPARISON:  02/26/2024 FINDINGS: Lower chest: Lung bases again demonstrate bronchi ectatic changes worse on the right than the left. Chronic scarring in the lingula is again seen. Some tree-in-bud changes in the right lower lobe are noted stable from the prior exam again likely related underlying inflammatory change. Hepatobiliary: No focal liver abnormality is seen. Status post cholecystectomy. No biliary dilatation. Pancreas: Unremarkable. No pancreatic ductal dilatation or surrounding inflammatory changes. Spleen: Normal in size without focal abnormality. Adrenals/Urinary Tract: Adrenal glands are within normal limits. Kidneys are well visualized bilaterally. No renal calculi or obstructive changes are noted. The bladder is well distended. Stomach/Bowel: Diffuse diverticular change of the colon is noted without evidence  of diverticulitis. The appendix is within normal limits. Small bowel and stomach are unremarkable. Vascular/Lymphatic: Aortic atherosclerosis. No enlarged abdominal or pelvic lymph nodes. Reproductive: Status post hysterectomy. No adnexal masses. Other: No abdominal wall hernia or abnormality. No abdominopelvic ascites. Musculoskeletal: No acute or significant osseous findings. Degenerative changes at L4-5 are again seen. IMPRESSION: Diverticulosis without diverticulitis. Stable changes in the lung bases consistent with chronic infection. Stable scarring in the lingula. Electronically Signed   By: Oneil Devonshire M.D.   On: 05/11/2024 20:12   DG Chest 2 View Result Date: 05/11/2024 CLINICAL DATA:  cp EXAM: CHEST - 2 VIEW COMPARISON:  Cxr 02/18/24 FINDINGS: The heart and mediastinal contours are unchanged Developing left lower airspace opacity. No pulmonary edema. No pleural effusion. No pneumothorax. No acute osseous abnormality. IMPRESSION: Developing left lower airspace opacity. Followup PA and lateral chest X-ray is recommended in 3-4 weeks following therapy to ensure resolution and exclude underlying malignancy. Electronically Signed   By: Morgane  Naveau M.D.   On: 05/11/2024 19:34    Assessment & Plan:   Problem List Items Addressed This Visit     Abdominal pain   IBS We can try NTG 0.4 mg SL for severe intestinal  spasm as Plan B option      Anxiety disorder   F./u w/Dr Mitch      BRONCHITIS, ACUTE   Zpack      Irritable bowel syndrome with both constipation and diarrhea - Primary    Chronic IBS - states it is worse after her last colonoscopy in Dec 2023 (Dr Eartha): bad cramps w/BM. Seeing Miss Carlan, NP w/GI - on Bentyl , Amitiza  We can try NTG 0.4 mg SL for severe intestinal  spasm as Plan B option      Relevant Medications   dicyclomine  (BENTYL ) 20 MG tablet   linaclotide (LINZESS) 72 MCG capsule   Vitamin B12 deficiency   On B12  Meds ordered this encounter   Medications   nitroGLYCERIN (NITROLINGUAL) 0.4 MG/SPRAY spray    Sig: Place 1 spray under the tongue every 5 (five) minutes x 3 doses as needed (cevere abdominal cramps).    Dispense:  4.9 g    Refill:  2   azithromycin  (ZITHROMAX  Z-PAK) 250 MG tablet    Sig: As directed    Dispense:  6 tablet    Refill:  0      Follow-up: Return in about 3 months (around 10/27/2024) for a follow-up visit.  Marolyn Noel, MD

## 2024-07-28 NOTE — Addendum Note (Signed)
 Addended by: Angila Wombles V on: 07/28/2024 04:25 PM   Modules accepted: Orders

## 2024-07-28 NOTE — Telephone Encounter (Signed)
 I spoke with the patient and made her aware per Mitzie, I have sent amitiza 8mcg to take twice daily, this is the lower dose of this medication, we can see how she does on this and go up to the higher dose if needed. Patient states understanding.

## 2024-07-28 NOTE — Addendum Note (Signed)
 Addended by: BEVELY CURTISTINE PARAS on: 07/28/2024 04:31 PM   Modules accepted: Orders

## 2024-07-28 NOTE — Assessment & Plan Note (Signed)
 F./u w/Dr Mitch

## 2024-07-28 NOTE — Assessment & Plan Note (Addendum)
 Chronic IBS - states it is worse after her last colonoscopy in Dec 2023 (Dr Eartha): bad cramps w/BM. Seeing Miss Carlan, NP w/GI - on Bentyl , Amitiza  We can try NTG 0.4 mg SL for severe intestinal  spasm as Plan B option

## 2024-07-28 NOTE — Assessment & Plan Note (Signed)
 IBS We can try NTG 0.4 mg SL for severe intestinal  spasm as Plan B option

## 2024-07-28 NOTE — Assessment & Plan Note (Signed)
 On B12

## 2024-07-28 NOTE — Assessment & Plan Note (Signed)
 Zpack

## 2024-08-10 ENCOUNTER — Ambulatory Visit (INDEPENDENT_AMBULATORY_CARE_PROVIDER_SITE_OTHER)

## 2024-08-10 VITALS — Ht 66.0 in | Wt 130.0 lb

## 2024-08-10 DIAGNOSIS — Z0000 Encounter for general adult medical examination without abnormal findings: Secondary | ICD-10-CM | POA: Diagnosis not present

## 2024-08-10 NOTE — Progress Notes (Addendum)
 Subjective:   Melody [name] is a 68 y.o. who presents for a Medicare Wellness preventive visit.  As a reminder, Annual Wellness Visits don't include a physical exam, and some assessments may be limited, especially if this visit is performed virtually. We may recommend an in-person follow-up visit with your provider if needed.  Visit Complete: Virtual I connected with  Melody [name] on 08/10/24 by a audio enabled telemedicine application and verified that I am speaking with the correct person using two identifiers.  Patient Location: Home  Provider Location: Office/Clinic  I discussed the limitations of evaluation and management by telemedicine. The patient expressed understanding and agreed to proceed.  Vital Signs: Because this visit was a virtual/telehealth visit, some criteria may be missing or patient reported. Any vitals not documented were not able to be obtained and vitals that have been documented are patient reported.  VideoDeclined- This patient declined Librarian, academic. Therefore the visit was completed with audio only.  Persons Participating in Visit: Patient.  AWV Questionnaire: No: Patient Medicare AWV questionnaire was not completed prior to this visit.  Cardiac Risk Factors include: advanced age (>68men, >86 women)     Objective:    Today's Vitals   08/10/24 1235  Weight: 130 lb (59 kg)  Height: 5' 6 (1.676 m)   Body mass index is 20.98 kg/m.     08/10/2024   12:35 PM 05/11/2024    6:34 PM 09/23/2023   11:09 AM 09/18/2023    2:20 PM 04/03/2023   11:25 PM 03/27/2023    8:43 PM 11/30/2022    8:33 PM  Advanced Directives  Does Patient Have a Medical Advance Directive? No No No No No No No  Would patient like information on creating a medical advance directive? No - Patient declined No - Patient declined No - Patient declined No - Patient declined No - Patient declined      Current Medications (verified) Outpatient Encounter  Medications as of 08/10/2024  Medication Sig   albuterol  (VENTOLIN  HFA) 108 (90 Base) MCG/ACT inhaler Inhale 2 puffs into the lungs every 4 (four) hours as needed for wheezing or shortness of breath.   ALPRAZolam  (XANAX ) 1 MG tablet Take 1 mg by mouth 3 (three) times daily.   azithromycin  (ZITHROMAX  Z-PAK) 250 MG tablet As directed   Cholecalciferol (VITAMIN D3) 1.25 MG (50000 UT) CAPS TAKE ONE CAPSULE BY MOUTH EVERY 14 DAYS ON TUESDAYS   clotrimazole  (MYCELEX ) 10 MG troche Take 1 tablet (10 mg total) by mouth 5 (five) times daily.   clotrimazole -betamethasone  (LOTRISONE ) cream Apply 1 Application topically daily.   cyanocobalamin  (VITAMIN B12) 1000 MCG/ML injection INJECT 1ML UNDER THE SKIN EVERY 14 DAYS   diclofenac  Sodium (VOLTAREN ) 1 % GEL APPLY TWO GRAMS TO AFFECTED AREA(S) FOUR TIMES DAILY   dicyclomine  (BENTYL ) 20 MG tablet Take 20 mg by mouth every 6 (six) hours.   fluticasone  (FLONASE ) 50 MCG/ACT nasal spray INSTILL TWO SPRAYS IN EACH NOSTRIL DAILY   Lactulose  20 GM/30ML SOLN Take 30-60 mLs (20-40 g total) by mouth 2 (two) times daily as needed (severe constipation).   levocetirizine (XYZAL ) 5 MG tablet TAKE 1 TABLET BY MOUTH EVERY EVENING (FOR ALLERGY )   levothyroxine  (SYNTHROID ) 50 MCG tablet TAKE 1 TABLET BY MOUTH EVERY MORNING   linaclotide (LINZESS) 72 MCG capsule Take 72 mcg by mouth daily before breakfast.   losartan  (COZAAR ) 25 MG tablet TAKE 1 TABLET BY MOUTH DAILY   lubiprostone (AMITIZA) 8 MCG capsule Take  1 capsule (8 mcg total) by mouth 2 (two) times daily with a meal.   methylphenidate  (RITALIN ) 20 MG tablet Take 1 tablet (20 mg total) by mouth daily.   nitroGLYCERIN (NITROLINGUAL) 0.4 MG/SPRAY spray Place 1 spray under the tongue every 5 (five) minutes x 3 doses as needed (cevere abdominal cramps).   ondansetron  (ZOFRAN ) 4 MG tablet Take 1 tablet (4 mg total) by mouth every 8 (eight) hours as needed for nausea or vomiting.   oxyCODONE -acetaminophen  (PERCOCET/ROXICET)  5-325 MG tablet TAKE 1 TABLET BY MOUTH EVERY 8 HOURS AS NEEDED FOR SEVERE PAIN   pantoprazole  (PROTONIX ) 40 MG tablet TAKE 1 TABLET BY MOUTH TWICE DAILY   polyethylene glycol powder (GLYCOLAX /MIRALAX ) 17 GM/SCOOP powder Take 17 g by mouth 2 (two) times daily for 14 days, THEN 17 g daily.   psyllium (METAMUCIL) 58.6 % packet Take 1 packet by mouth 2 (two) times daily.   QUEtiapine  (SEROQUEL ) 25 MG tablet Take 25 mg by mouth at bedtime.   SPIRIVA  RESPIMAT 2.5 MCG/ACT AERS INHALE TWO PUFFS BY MOUTH EVERY DAY   No facility-administered encounter medications on file as of 08/10/2024.    Allergies (verified) Flagyl  [metronidazole ], Bentyl  [dicyclomine ], Doxycycline hyclate, Dulera  [mometasone  furo-formoterol fum], Gabapentin , Linzess [linaclotide], Nabumetone, Sulfa antibiotics, Tetracycline hcl, and Tramadol  hcl   History: Past Medical History:  Diagnosis Date   Anxiety    Blood dyscrasia    thrombocytopenia   Bronchiectasis (HCC)    Chronic back pain    Chronic neck pain    COPD (chronic obstructive pulmonary disease) (HCC)    Depression    bipolar- Dr Vincente   Fibromyalgia    GERD (gastroesophageal reflux disease)    Headache    Migraine,   History of hiatal hernia    Hypertension    Hypothyroidism    Low back pain    Dr Mavis   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[facility])    Osteoarthritis    Pneumonia    Pulmonary nodule    Vertigo    Vitamin B12 deficiency    Past Surgical History:  Procedure Laterality Date   ABDOMINAL HYSTERECTOMY N/A 01/10/2016   Procedure: HYSTERECTOMY ABDOMINAL;  Surgeon: Bobie FORBES Cathlyn JAYSON Nikki, MD;  Location: [facility];  Service: Gynecology;  Laterality: N/A;   ABDOMINAL SACROCOLPOPEXY N/A 01/10/2016   Procedure: ABDOMINO SACROCOLPOPEXY ;  Surgeon: Bobie FORBES Cathlyn JAYSON Nikki, MD;  Location: [facility];  Service: Gynecology;  Laterality: N/A;   ANTERIOR AND POSTERIOR REPAIR N/A 01/10/2016   Procedure:  POSTERIOR REPAIR (RECTOCELE);  Surgeon: Bobie FORBES Cathlyn JAYSON Nikki, MD;  Location: [facility];  Service: Gynecology;  Laterality: N/A;   BIOPSY  10/19/2022   Procedure: BIOPSY;  Surgeon: Melody Angelia Sieving, MD;  [facility];  Service: Gastroenterology;;   BLADDER SUSPENSION N/A 01/10/2016   Procedure: TRANSVAGINAL TAPE (TVT) PROCEDURE exact midurethral sling;  Surgeon: Bobie FORBES Cathlyn JAYSON Nikki, MD;  Location: [facility];  Service: Gynecology;  Laterality: N/A;   CERVICAL LAMINECTOMY  2001 and 2005   Dr Mavis    ACDF  C5-6, C6-7   CHOLECYSTECTOMY N/A 09/23/2023   Procedure: LAPAROSCOPIC CHOLECYSTECTOMY;  Surgeon: Signe Mitzie LABOR, MD;  Location: [facility];  Service: General;  Laterality: N/A;   COLONOSCOPY WITH PROPOFOL  N/A 10/19/2022   Procedure: COLONOSCOPY WITH PROPOFOL ;  Surgeon: Melody Angelia Sieving, MD;  [facility];  Service: Gastroenterology;  Laterality: N/A;  10:30am, asa 1-2   CYSTO N/A 01/10/2016   Procedure: CYSTOSCOPY;  Surgeon: Bobie  E Cathlyn JAYSON Cary, MD;  Location: [facility];  Service: Gynecology;  Laterality: N/A;   ESOPHAGEAL DILATION N/A 10/19/2022   Procedure: ESOPHAGEAL DILATION;  Surgeon: Melody Angelia Sieving, MD;  [facility];  Service: Gastroenterology;  Laterality: N/A;   ESOPHAGOGASTRODUODENOSCOPY (EGD) WITH PROPOFOL  N/A 10/19/2022   Procedure: ESOPHAGOGASTRODUODENOSCOPY (EGD) WITH PROPOFOL ;  Surgeon: Melody Angelia Sieving, MD;  [facility];  Service: Gastroenterology;  Laterality: N/A;   POLYPECTOMY  10/19/2022   Procedure: POLYPECTOMY INTESTINAL;  Surgeon: Melody Angelia Sieving, MD;  [facility];  Service: Gastroenterology;;   SALPINGOOPHORECTOMY Bilateral 01/10/2016   Procedure: BILATERAL SALPINGO OOPHORECTOMY;  Surgeon: Bobie FORBES Cathlyn JAYSON Cary, MD;  Location: [facility];  Service: Gynecology;  Laterality: Bilateral;   SAVORY DILATION  10/19/2022   Procedure: SAVORY DILATION;  Surgeon: Melody Angelia, Sieving, MD;  [facility];   Service: Gastroenterology;;   TUBAL LIGATION  10/29/1978   Family History  Problem Relation Age of Onset   Heart attack Father        dec heart disease age 79   Hypertension Father    Lung disease Father    Cancer Mother        dec stomach ca--age 11   Breast cancer Cousin    Arthritis Other    Breast cancer Paternal Aunt    Social History   Socioeconomic History   Marital status: Legally Separated    Spouse name: Not on file   Number of children: Not on file   Years of education: Not on file   Highest education level: Not on file  Occupational History   Not on file  Tobacco Use   Smoking status: Former    Current packs/day: 0.00    Average packs/day: 1 pack/day for 48.5 years (48.5 ttl pk-yrs)    Types: Cigarettes    Start date: 11/21/1962    Quit date: 05/06/2011    Years since quitting: 13.2   Smokeless tobacco: Never   Tobacco comments:    stopped periodically - peak rate of 1 ppd  Vaping Use   Vaping status: Never Used  Substance and Sexual Activity   Alcohol use: No    Alcohol/week: 0.0 standard drinks of alcohol   Drug use: No   Sexual activity: Not Currently    Birth control/protection: Post-menopausal, Surgical    Comment: Tubal/Hyst/BSO  Other Topics Concern   Not on file  Social History Narrative   [facility] (04/04/17):   Originally from [place], [place]. 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 Recently had a persian cat that passed. No bird exposure. She denies any indoor plants currently. She does use wood burning for heat. She reports there is water  getting under her foundation. She has mold in her home that she reports is in the 2nd floor of her home. She has recently started seeing mold under her bed. She reports her home smells musty & damp. She has always lived in [place].    Social Drivers of Corporate investment banker Strain: Low Risk  (08/10/2024)   Overall Financial Resource Strain (CARDIA)     Difficulty of Paying Living Expenses: Not hard at all  Food Insecurity: No Food Insecurity (08/10/2024)   Hunger Vital Sign    Worried About Running Out of Food in the Last Year: Never true    Ran Out of Food in the Last Year: Never true  Transportation  Needs: No Transportation Needs (08/10/2024)   PRAPARE - Administrator, Civil Service (Medical): No    Lack of Transportation (Non-Medical): No  Physical Activity: Inactive (08/10/2024)   Exercise Vital Sign    Days of Exercise per Week: 0 days    Minutes of Exercise per Session: 0 min  Stress: No Stress Concern Present (08/10/2024)   [employer] of Occupational Health - Occupational Stress Questionnaire    Feeling of Stress: Only a little  Social Connections: Socially Isolated (08/10/2024)   Social Connection and Isolation Panel    Frequency of Communication with Friends and Family: Twice a week    Frequency of Social Gatherings with Friends and Family: Never    Attends Religious Services: Never    Diplomatic Services operational officer: No    Attends Engineer, structural: Never    Marital Status: Separated    Tobacco Counseling Counseling given: Not Answered Tobacco comments: stopped periodically - peak rate of 1 ppd    Clinical Intake:  Pre-visit preparation completed: Yes  Pain : No/denies pain     BMI - recorded: 20.98 Nutritional Status: BMI of 19-24  Normal Nutritional Risks: None Diabetes: No  No results found for: HGBA1C   How often do you need to have someone help you when you read instructions, pamphlets, or other written materials from your doctor or pharmacy?: 1 - Never  Interpreter Needed?: No  Information entered by :: Verdie Saba, CMA   Activities of Daily Living     08/10/2024   12:37 PM 09/18/2023    2:22 PM  In your present state of health, do you have any difficulty performing the following activities:  Hearing? 0   Vision? 0   Difficulty concentrating or  making decisions? 0   Walking or climbing stairs? 0   Dressing or bathing? 0   Doing errands, shopping? 0 0  Preparing Food and eating ? N   Using the Toilet? N   In the past six months, have you accidently leaked urine? Y   Comment wears a pad/pantyliner   Do you have problems with loss of bowel control? N   Managing your Medications? N   Managing your Finances? N   Housekeeping or managing your Housekeeping? N     Patient Care Team: Plotnikov, Karlynn GAILS, MD as PCP - General Debera Jayson MATSU, MD as PCP - Cardiology (Cardiology) Cathlyn JAYSON Nikki Bobie FORBES, MD as Consulting Physician (Obstetrics and Gynecology) Vincente Grip, MD as Consulting Physician (Psychiatry) Darlean Ozell NOVAK, MD as Consulting Physician (Pulmonary Disease) Signe Mitzie LABOR, MD as Consulting Physician (General Surgery)  I have updated your Care Teams any recent Medical Services you may have received from other providers in the past year.     Assessment:   This is a routine wellness examination for Tamkia.  Hearing/Vision screen Hearing Screening - Comments:: Denies hearing difficulties   Vision Screening - Comments:: Wears contact lenses     Goals Addressed               This Visit's Progress     Patient Stated (pt-stated)        Patient stated she plans to continue living day to day       Depression Screen     08/10/2024   12:39 PM 02/05/2024    4:10 PM 01/16/2024    4:45 PM 08/23/2023    3:59 PM 06/19/2023    3:46 PM 12/17/2022   11:28  AM 12/25/2021    4:44 PM  PHQ 2/9 Scores  PHQ - 2 Score 0 6 6 0 0 6 6  PHQ- 9 Score 0 22 17   14 12     Fall Risk     08/10/2024   12:38 PM 07/28/2024    3:41 PM 02/05/2024    4:10 PM 08/23/2023    3:59 PM 06/19/2023    3:46 PM  Fall Risk   Falls in the past year? 0 0 1 1 0  Number falls in past yr: 0 0 0 0 0  Injury with Fall? 0 0 1 1 0  Risk for fall due to : No Fall Risks No Fall Risks  Impaired balance/gait No Fall Risks  Follow up Falls  evaluation completed;Falls prevention discussed Falls evaluation completed Falls evaluation completed Falls evaluation completed Falls evaluation completed    MEDICARE RISK AT HOME:  Medicare Risk at Home Any stairs in or around the home?: No If so, are there any without handrails?: No Home free of loose throw rugs in walkways, pet beds, electrical cords, etc?: Yes Adequate lighting in your home to reduce risk of falls?: Yes Life alert?: No Use of a cane, walker or w/c?: No Grab bars in the bathroom?: No Shower chair or bench in shower?: Yes Elevated toilet seat or a handicapped toilet?: No  TIMED UP AND GO:  Was the test performed?  No  Cognitive Function: 6CIT completed        08/10/2024   12:40 PM  6CIT Screen  What Year? 0 points  What month? 0 points  What time? 0 points  Count back from 20 0 points  Months in reverse 0 points  Repeat phrase 2 points  Total Score 2 points    Immunizations Immunization History  Administered Date(s) Administered   Fluad Quad(high Dose 65+) 08/31/2020, 07/27/2022, 08/16/2023   INFLUENZA, HIGH DOSE SEASONAL PF 09/05/2021, 07/28/2024   Influenza Split 08/05/2012   Influenza Whole 09/01/2008, 09/02/2009   Influenza,inj,Quad PF,6+ Mos 08/10/2016, 08/23/2017, 08/06/2018, 06/24/2019   Influenza,inj,quad, With Preservative 08/29/2020   Influenza-Unspecified 08/29/2013, 07/30/2014, 08/01/2015   Moderna Sars-Covid-2 Vaccination 04/04/2020, 05/02/2020   PNEUMOCOCCAL CONJUGATE-20 08/16/2023   Pneumococcal Conjugate-13 08/01/2015   Pneumococcal Polysaccharide-23 05/16/2018   Respiratory Syncytial Virus Vaccine,Recomb Aduvanted(Arexvy) 07/27/2022   Tdap 08/01/2015   Zoster, Live 08/01/2015    Screening Tests Health Maintenance  Topic Date Due   Mammogram  01/31/2019   DEXA SCAN  Never done   Lung Cancer Screening  04/02/2020   COVID-19 Vaccine (3 - Moderna risk series) 05/30/2020   DTaP/Tdap/Td (2 - Td or Tdap) 07/31/2025   Medicare  Annual Wellness (AWV)  08/10/2025   Colonoscopy  10/19/2032   Pneumococcal Vaccine: 50+ Years  Completed   Influenza Vaccine  Completed   Hepatitis C Screening  Completed   Meningococcal B Vaccine  Aged Out   Zoster Vaccines- Shingrix  Discontinued    Health Maintenance Items Addressed: I have recommended that this patient have a mammogram, DEXA scan, and Lung Cancer Screening but she declines at this time. I have discussed the risks and benefits of this procedure with her. The patient verbalizes understanding.   Additional Screening:  Vision Screening: Recommended annual ophthalmology exams for early detection of glaucoma and other disorders of the eye. Is the patient up to date with their annual eye exam?  Yes   Dental Screening: Recommended annual dental exams for proper oral hygiene  Community Resource Referral / Chronic Care Management: CRR  required this visit?  No   CCM required this visit?  No   Plan:    I have personally reviewed and noted the following in the patient's chart:   Medical and social history Use of alcohol, tobacco or illicit drugs  Current medications and supplements including opioid prescriptions. Patient is currently taking opioid prescriptions. Information provided to patient regarding non-opioid alternatives. Patient advised to discuss non-opioid treatment plan with their provider. Functional ability and status Nutritional status Physical activity Advanced directives List of other physicians Hospitalizations, surgeries, and ER visits in previous 12 months Vitals Screenings to include cognitive, depression, and falls Referrals and appointments  In addition, I have reviewed and discussed with patient certain preventive protocols, quality metrics, and best practice recommendations. A written personalized care plan for preventive services as well as general preventive health recommendations were provided to patient.   Verdie CHRISTELLA Saba,  CMA   08/10/2024   After Visit Summary: (MyChart) Due to this being a telephonic visit, the after visit summary with patients personalized plan was offered to patient via MyChart   Notes: Nothing significant to report at this time.  Medical screening examination/treatment/procedure(s) were performed by non-physician practitioner and as supervising physician I was immediately available for consultation/collaboration.  I agree with above. Karlynn Noel, MD

## 2024-08-10 NOTE — Patient Instructions (Addendum)
 Melody [name],  Thank you for taking the time for your Medicare Wellness Visit. I appreciate your continued commitment to your health goals. Please review the care plan we discussed, and feel free to reach out if I can assist you further.  Medicare recommends these wellness visits once per year to help you and your care team stay ahead of potential health issues. These visits are designed to focus on prevention, allowing your provider to concentrate on managing your acute and chronic conditions during your regular appointments.  Please note that Annual Wellness Visits do not include a physical exam. Some assessments may be limited, especially if the visit was conducted virtually. If needed, we may recommend a separate in-person follow-up with your provider.  Ongoing Care Seeing your primary care provider every 3 to 6 months helps us  monitor your health and provide consistent, personalized care.   Referrals If a referral was made during today's visit and you haven't received any updates within two weeks, please contact the referred provider directly to check on the status.  Recommended Screenings:  Health Maintenance  Topic Date Due   Breast Cancer Screening  01/31/2019   DEXA scan (bone density measurement)  Never done   Screening for Lung Cancer  04/02/2020   COVID-19 Vaccine (3 - Moderna risk series) 05/30/2020   DTaP/Tdap/Td vaccine (2 - Td or Tdap) 07/31/2025   Medicare Annual Wellness Visit  08/10/2025   Colon Cancer Screening  10/19/2032   Pneumococcal Vaccine for age over 57  Completed   Flu Shot  Completed   Hepatitis C Screening  Completed   Meningitis B Vaccine  Aged Out   Zoster (Shingles) Vaccine  Discontinued       08/10/2024   12:35 PM  Advanced Directives  Does Patient Have a Medical Advance Directive? No  Would patient like information on creating a medical advance directive? No - Patient declined   Advance Care Planning is important because it: Ensures you receive  medical care that aligns with your values, goals, and preferences. Provides guidance to your family and loved ones, reducing the emotional burden of decision-making during critical moments.  Vision: Annual vision screenings are recommended for early detection of glaucoma, cataracts, and diabetic retinopathy. These exams can also reveal signs of chronic conditions such as diabetes and high blood pressure.  Dental: Annual dental screenings help detect early signs of oral cancer, gum disease, and other conditions linked to overall health, including heart disease and diabetes.

## 2024-08-11 ENCOUNTER — Other Ambulatory Visit: Payer: Self-pay | Admitting: Internal Medicine

## 2024-08-11 DIAGNOSIS — N3281 Overactive bladder: Secondary | ICD-10-CM

## 2024-08-12 ENCOUNTER — Encounter (INDEPENDENT_AMBULATORY_CARE_PROVIDER_SITE_OTHER): Payer: Self-pay | Admitting: Gastroenterology

## 2024-08-27 ENCOUNTER — Other Ambulatory Visit: Payer: Self-pay | Admitting: Internal Medicine

## 2024-09-20 ENCOUNTER — Other Ambulatory Visit (INDEPENDENT_AMBULATORY_CARE_PROVIDER_SITE_OTHER): Payer: Self-pay | Admitting: Gastroenterology

## 2024-09-22 ENCOUNTER — Ambulatory Visit (INDEPENDENT_AMBULATORY_CARE_PROVIDER_SITE_OTHER): Admitting: Gastroenterology

## 2024-09-22 ENCOUNTER — Other Ambulatory Visit: Payer: Self-pay | Admitting: Internal Medicine

## 2024-10-08 ENCOUNTER — Encounter (INDEPENDENT_AMBULATORY_CARE_PROVIDER_SITE_OTHER): Payer: Self-pay | Admitting: Gastroenterology

## 2024-10-12 ENCOUNTER — Ambulatory Visit: Payer: Self-pay

## 2024-10-12 NOTE — Telephone Encounter (Signed)
 FYI Only or Action Required?: Action required by provider: Pt is requesting Z-Pak called to pharmacy, she declines OV as she reports she does not feel well enough to come in. This RN educated pt on home care, new-worsening symptoms, when to call back/seek emergent care. Pt verbalized understanding and agrees to plan.   Patient was last seen in primary care on 07/28/2024 by Plotnikov, Melody GAILS, MD.  Called Nurse Triage reporting Cough.  Symptoms began several weeks ago.  Interventions attempted: Rest, hydration, or home remedies.  Symptoms are: gradually worsening.  Triage Disposition: See HCP Within 4 Hours (Or PCP Triage)  Patient/caregiver understands and will follow disposition?: Yes   Copied from CRM #[id number]. Topic: Clinical - Red Word Triage >> Oct 12, 2024  3:35 PM Melody [name] wrote: Red Word that prompted transfer to Nurse Triage: Patient states she feels like she is about to pass out, feeling very weak. Coughing a lot, brown/green phlegm. Requesting OV or Zpak    [facility] - [name], [place] - [address] Reason for Disposition  [1] MILD difficulty breathing (e.g., minimal/no SOB at rest, SOB with walking, pulse < 100) AND [2] still present when not coughing  Answer Assessment - Initial Assessment Questions 1. ONSET: When did the cough begin?      X 2 weeks 3. SPUTUM: Describe the color of your sputum (e.g., none, dry cough; clear, white, yellow, green)     Dark brown, green 4. HEMOPTYSIS: Are you coughing up any blood? If Yes, ask: How much? (e.g., flecks, streaks, tablespoons, etc.)     None 5. DIFFICULTY BREATHING: Are you having difficulty breathing? If Yes, ask: How bad is it? (e.g., mild, moderate, severe)      SOB with exertion 6. FEVER: Do you have a fever? If Yes, ask: What is your temperature, how was it measured, and when did it start?     Mild, resolves on its own 8. LUNG HISTORY: Do you have any history of lung disease?  (e.g., pulmonary  embolus, asthma, emphysema)     COPD 10. OTHER SYMPTOMS: Do you have any other symptoms? (e.g., runny nose, wheezing, chest pain)       Weakness,  Protocols used: Cough - Acute Productive-A-AH

## 2024-10-13 ENCOUNTER — Ambulatory Visit: Payer: Self-pay

## 2024-10-13 NOTE — Telephone Encounter (Signed)
 Pt triaged yesterday.   Copied from CRM #[id number]. Topic: Appointments - Appointment Cancel/Reschedule >> Oct 12, 2024  3:30 PM Sophia H wrote: Patient/patient representative is calling to cancel or reschedule an appointment. Refer to attachments for appointment information.   Reschedule / patient has court same day  - Nov 03 2024 03:00 PM - Office Visit [facility] Garald GAILS, MD  Moved to : Thursday Nov 26, 2024 Arrive by 2:45 PM Appt at 3:00 PM (20 min) >> Oct 12, 2024  4:03 PM Sophia H wrote: Patient declined to hold any longer - please reach out. TY  # [phone]

## 2024-10-26 ENCOUNTER — Other Ambulatory Visit: Payer: Self-pay | Admitting: Internal Medicine

## 2024-10-30 ENCOUNTER — Other Ambulatory Visit: Payer: Self-pay | Admitting: Internal Medicine

## 2024-11-03 ENCOUNTER — Ambulatory Visit: Admitting: Internal Medicine

## 2024-11-15 ENCOUNTER — Other Ambulatory Visit: Payer: Self-pay | Admitting: Internal Medicine

## 2024-11-15 DIAGNOSIS — E034 Atrophy of thyroid (acquired): Secondary | ICD-10-CM

## 2024-11-26 ENCOUNTER — Ambulatory Visit: Admitting: Internal Medicine

## 2024-12-23 ENCOUNTER — Ambulatory Visit: Admitting: Internal Medicine

## 2025-08-16 ENCOUNTER — Encounter: Admitting: Internal Medicine

## 2025-08-16 ENCOUNTER — Ambulatory Visit
